# Patient Record
Sex: Female | Born: 1955 | Race: White | Hispanic: No | Marital: Married | Smoking: Never smoker
Health system: Southern US, Community
[De-identification: ages and names within clinical notes are randomized; demographics above are authoritative.]

## PROBLEM LIST (undated history)

## (undated) ENCOUNTER — Ambulatory Visit (HOSPITAL_BASED_OUTPATIENT_CLINIC_OR_DEPARTMENT_OTHER): Admission: EM | Source: Home / Self Care

## (undated) DIAGNOSIS — N183 Chronic kidney disease, stage 3 unspecified: Secondary | ICD-10-CM

## (undated) DIAGNOSIS — C569 Malignant neoplasm of unspecified ovary: Secondary | ICD-10-CM

## (undated) DIAGNOSIS — Z923 Personal history of irradiation: Secondary | ICD-10-CM

## (undated) DIAGNOSIS — D3910 Neoplasm of uncertain behavior of unspecified ovary: Secondary | ICD-10-CM

## (undated) DIAGNOSIS — M1990 Unspecified osteoarthritis, unspecified site: Secondary | ICD-10-CM

## (undated) DIAGNOSIS — K760 Fatty (change of) liver, not elsewhere classified: Secondary | ICD-10-CM

## (undated) DIAGNOSIS — E785 Hyperlipidemia, unspecified: Secondary | ICD-10-CM

## (undated) DIAGNOSIS — K219 Gastro-esophageal reflux disease without esophagitis: Secondary | ICD-10-CM

## (undated) DIAGNOSIS — Z8719 Personal history of other diseases of the digestive system: Secondary | ICD-10-CM

## (undated) DIAGNOSIS — G542 Cervical root disorders, not elsewhere classified: Secondary | ICD-10-CM

## (undated) DIAGNOSIS — M542 Cervicalgia: Secondary | ICD-10-CM

## (undated) DIAGNOSIS — M2550 Pain in unspecified joint: Secondary | ICD-10-CM

## (undated) DIAGNOSIS — G4730 Sleep apnea, unspecified: Secondary | ICD-10-CM

## (undated) DIAGNOSIS — K5900 Constipation, unspecified: Secondary | ICD-10-CM

## (undated) DIAGNOSIS — R112 Nausea with vomiting, unspecified: Secondary | ICD-10-CM

## (undated) DIAGNOSIS — F419 Anxiety disorder, unspecified: Secondary | ICD-10-CM

## (undated) DIAGNOSIS — R251 Tremor, unspecified: Secondary | ICD-10-CM

## (undated) DIAGNOSIS — Z9889 Other specified postprocedural states: Secondary | ICD-10-CM

## (undated) DIAGNOSIS — E669 Obesity, unspecified: Secondary | ICD-10-CM

## (undated) HISTORY — DX: Neoplasm of uncertain behavior of unspecified ovary: D39.10

## (undated) HISTORY — PX: OTHER SURGICAL HISTORY: SHX169

## (undated) HISTORY — DX: Malignant neoplasm of unspecified ovary: C56.9

## (undated) HISTORY — DX: Unspecified osteoarthritis, unspecified site: M19.90

## (undated) HISTORY — PX: APPENDECTOMY: SHX54

## (undated) HISTORY — DX: Constipation, unspecified: K59.00

## (undated) HISTORY — DX: Cervicalgia: M54.2

## (undated) HISTORY — DX: Gastro-esophageal reflux disease without esophagitis: K21.9

## (undated) HISTORY — DX: Fatty (change of) liver, not elsewhere classified: K76.0

## (undated) HISTORY — PX: EXPLORATORY LAPAROTOMY: SUR591

## (undated) HISTORY — DX: Hyperlipidemia, unspecified: E78.5

## (undated) HISTORY — PX: ABDOMINAL HYSTERECTOMY: SHX81

## (undated) HISTORY — PX: SHOULDER SURGERY: SHX246

## (undated) HISTORY — PX: VENTRAL HERNIA REPAIR: SHX424

## (undated) HISTORY — DX: Sleep apnea, unspecified: G47.30

## (undated) HISTORY — DX: Obesity, unspecified: E66.9

## (undated) HISTORY — DX: Tremor, unspecified: R25.1

## (undated) HISTORY — DX: Pain in unspecified joint: M25.50

---

## 1997-09-14 ENCOUNTER — Ambulatory Visit (HOSPITAL_COMMUNITY): Admission: RE | Admit: 1997-09-14 | Discharge: 1997-09-14 | Payer: Self-pay | Admitting: Obstetrics & Gynecology

## 1997-11-08 ENCOUNTER — Ambulatory Visit: Admission: RE | Admit: 1997-11-08 | Discharge: 1997-11-08 | Payer: Self-pay | Admitting: Gynecology

## 1998-04-04 ENCOUNTER — Ambulatory Visit (HOSPITAL_COMMUNITY): Admission: RE | Admit: 1998-04-04 | Discharge: 1998-04-04 | Payer: Self-pay | Admitting: Obstetrics & Gynecology

## 1998-04-04 ENCOUNTER — Encounter: Payer: Self-pay | Admitting: Obstetrics & Gynecology

## 1998-04-26 ENCOUNTER — Inpatient Hospital Stay (HOSPITAL_COMMUNITY): Admission: RE | Admit: 1998-04-26 | Discharge: 1998-05-01 | Payer: Self-pay | Admitting: Obstetrics & Gynecology

## 1998-11-21 ENCOUNTER — Encounter: Payer: Self-pay | Admitting: Obstetrics & Gynecology

## 1998-11-21 ENCOUNTER — Ambulatory Visit (HOSPITAL_COMMUNITY): Admission: RE | Admit: 1998-11-21 | Discharge: 1998-11-21 | Payer: Self-pay | Admitting: Obstetrics & Gynecology

## 1998-11-29 ENCOUNTER — Encounter: Payer: Self-pay | Admitting: Gynecology

## 1998-11-29 ENCOUNTER — Ambulatory Visit (HOSPITAL_COMMUNITY): Admission: RE | Admit: 1998-11-29 | Discharge: 1998-11-29 | Payer: Self-pay | Admitting: Gynecology

## 1998-11-29 ENCOUNTER — Encounter (INDEPENDENT_AMBULATORY_CARE_PROVIDER_SITE_OTHER): Payer: Self-pay | Admitting: Specialist

## 1998-12-01 ENCOUNTER — Encounter: Payer: Self-pay | Admitting: Obstetrics and Gynecology

## 1998-12-02 ENCOUNTER — Inpatient Hospital Stay (HOSPITAL_COMMUNITY): Admission: AD | Admit: 1998-12-02 | Discharge: 1998-12-04 | Payer: Self-pay | Admitting: Obstetrics and Gynecology

## 1999-02-21 ENCOUNTER — Ambulatory Visit (HOSPITAL_COMMUNITY): Admission: RE | Admit: 1999-02-21 | Discharge: 1999-02-21 | Payer: Self-pay | Admitting: Obstetrics & Gynecology

## 1999-02-21 ENCOUNTER — Encounter: Payer: Self-pay | Admitting: Obstetrics & Gynecology

## 1999-03-06 ENCOUNTER — Ambulatory Visit (HOSPITAL_COMMUNITY): Admission: RE | Admit: 1999-03-06 | Discharge: 1999-03-06 | Payer: Self-pay | Admitting: Gastroenterology

## 1999-07-17 ENCOUNTER — Other Ambulatory Visit: Admission: RE | Admit: 1999-07-17 | Discharge: 1999-07-17 | Payer: Self-pay | Admitting: Obstetrics & Gynecology

## 1999-07-18 ENCOUNTER — Ambulatory Visit (HOSPITAL_COMMUNITY): Admission: RE | Admit: 1999-07-18 | Discharge: 1999-07-18 | Payer: Self-pay | Admitting: Obstetrics & Gynecology

## 1999-07-18 ENCOUNTER — Encounter: Payer: Self-pay | Admitting: Obstetrics & Gynecology

## 2000-01-22 ENCOUNTER — Encounter: Payer: Self-pay | Admitting: Obstetrics & Gynecology

## 2000-01-22 ENCOUNTER — Ambulatory Visit (HOSPITAL_COMMUNITY): Admission: RE | Admit: 2000-01-22 | Discharge: 2000-01-22 | Payer: Self-pay | Admitting: Obstetrics & Gynecology

## 2000-03-19 ENCOUNTER — Other Ambulatory Visit: Admission: RE | Admit: 2000-03-19 | Discharge: 2000-03-19 | Payer: Self-pay | Admitting: Obstetrics & Gynecology

## 2000-04-08 ENCOUNTER — Encounter: Payer: Self-pay | Admitting: Obstetrics & Gynecology

## 2000-04-08 ENCOUNTER — Ambulatory Visit (HOSPITAL_COMMUNITY): Admission: RE | Admit: 2000-04-08 | Discharge: 2000-04-08 | Payer: Self-pay | Admitting: Obstetrics & Gynecology

## 2000-04-22 HISTORY — PX: OTHER SURGICAL HISTORY: SHX169

## 2000-04-23 ENCOUNTER — Ambulatory Visit: Admission: RE | Admit: 2000-04-23 | Discharge: 2000-04-23 | Payer: Self-pay | Admitting: Gynecology

## 2000-04-30 ENCOUNTER — Inpatient Hospital Stay (HOSPITAL_COMMUNITY): Admission: RE | Admit: 2000-04-30 | Discharge: 2000-05-03 | Payer: Self-pay | Admitting: Gynecology

## 2000-04-30 ENCOUNTER — Encounter (INDEPENDENT_AMBULATORY_CARE_PROVIDER_SITE_OTHER): Payer: Self-pay

## 2000-05-06 ENCOUNTER — Ambulatory Visit: Admission: RE | Admit: 2000-05-06 | Discharge: 2000-05-06 | Payer: Self-pay | Admitting: Gynecology

## 2000-06-11 ENCOUNTER — Ambulatory Visit: Admission: RE | Admit: 2000-06-11 | Discharge: 2000-06-11 | Payer: Self-pay | Admitting: Gynecology

## 2000-10-06 ENCOUNTER — Other Ambulatory Visit: Admission: RE | Admit: 2000-10-06 | Discharge: 2000-10-06 | Payer: Self-pay | Admitting: Gynecology

## 2000-10-13 ENCOUNTER — Observation Stay (HOSPITAL_COMMUNITY): Admission: EM | Admit: 2000-10-13 | Discharge: 2000-10-14 | Payer: Self-pay | Admitting: Gynecology

## 2000-11-04 ENCOUNTER — Encounter: Admission: RE | Admit: 2000-11-04 | Discharge: 2000-11-04 | Payer: Self-pay | Admitting: Gastroenterology

## 2000-11-04 ENCOUNTER — Encounter: Payer: Self-pay | Admitting: Gastroenterology

## 2000-11-05 ENCOUNTER — Other Ambulatory Visit: Admission: RE | Admit: 2000-11-05 | Discharge: 2000-11-05 | Payer: Self-pay | Admitting: Gynecology

## 2000-11-05 ENCOUNTER — Ambulatory Visit: Admission: RE | Admit: 2000-11-05 | Discharge: 2000-11-05 | Payer: Self-pay | Admitting: Gynecology

## 2000-11-05 ENCOUNTER — Encounter (INDEPENDENT_AMBULATORY_CARE_PROVIDER_SITE_OTHER): Payer: Self-pay | Admitting: *Deleted

## 2000-12-24 ENCOUNTER — Ambulatory Visit: Admission: RE | Admit: 2000-12-24 | Discharge: 2000-12-24 | Payer: Self-pay | Admitting: Gynecology

## 2000-12-26 ENCOUNTER — Ambulatory Visit (HOSPITAL_COMMUNITY): Admission: RE | Admit: 2000-12-26 | Discharge: 2000-12-26 | Payer: Self-pay | Admitting: Gynecology

## 2000-12-26 ENCOUNTER — Encounter: Payer: Self-pay | Admitting: Gynecology

## 2001-01-14 ENCOUNTER — Ambulatory Visit (HOSPITAL_BASED_OUTPATIENT_CLINIC_OR_DEPARTMENT_OTHER): Admission: RE | Admit: 2001-01-14 | Discharge: 2001-01-14 | Payer: Self-pay | Admitting: Gynecology

## 2001-02-25 ENCOUNTER — Ambulatory Visit: Admission: RE | Admit: 2001-02-25 | Discharge: 2001-02-25 | Payer: Self-pay | Admitting: Gynecology

## 2001-04-10 ENCOUNTER — Ambulatory Visit (HOSPITAL_COMMUNITY): Admission: RE | Admit: 2001-04-10 | Discharge: 2001-04-10 | Payer: Self-pay | Admitting: Gynecology

## 2001-04-10 ENCOUNTER — Encounter: Payer: Self-pay | Admitting: Gynecology

## 2001-05-13 ENCOUNTER — Ambulatory Visit: Admission: RE | Admit: 2001-05-13 | Discharge: 2001-05-13 | Payer: Self-pay | Admitting: Gynecology

## 2001-07-13 ENCOUNTER — Other Ambulatory Visit: Admission: RE | Admit: 2001-07-13 | Discharge: 2001-07-13 | Payer: Self-pay | Admitting: Obstetrics & Gynecology

## 2001-08-10 ENCOUNTER — Ambulatory Visit (HOSPITAL_BASED_OUTPATIENT_CLINIC_OR_DEPARTMENT_OTHER): Admission: RE | Admit: 2001-08-10 | Discharge: 2001-08-10 | Payer: Self-pay | Admitting: Urology

## 2001-11-18 ENCOUNTER — Ambulatory Visit: Admission: RE | Admit: 2001-11-18 | Discharge: 2001-11-18 | Payer: Self-pay | Admitting: Gynecology

## 2002-02-24 ENCOUNTER — Encounter: Payer: Self-pay | Admitting: Gynecology

## 2002-02-24 ENCOUNTER — Ambulatory Visit: Admission: RE | Admit: 2002-02-24 | Discharge: 2002-02-24 | Payer: Self-pay | Admitting: Gynecology

## 2002-08-26 ENCOUNTER — Ambulatory Visit (HOSPITAL_COMMUNITY): Admission: RE | Admit: 2002-08-26 | Discharge: 2002-08-26 | Payer: Self-pay | Admitting: Gynecology

## 2002-08-26 ENCOUNTER — Encounter: Payer: Self-pay | Admitting: Gynecology

## 2002-11-15 ENCOUNTER — Ambulatory Visit (HOSPITAL_COMMUNITY): Admission: RE | Admit: 2002-11-15 | Discharge: 2002-11-15 | Payer: Self-pay | Admitting: Internal Medicine

## 2002-11-15 ENCOUNTER — Encounter: Payer: Self-pay | Admitting: Internal Medicine

## 2002-11-23 ENCOUNTER — Other Ambulatory Visit: Admission: RE | Admit: 2002-11-23 | Discharge: 2002-11-23 | Payer: Self-pay | Admitting: Obstetrics & Gynecology

## 2002-11-30 ENCOUNTER — Ambulatory Visit: Admission: RE | Admit: 2002-11-30 | Discharge: 2002-11-30 | Payer: Self-pay | Admitting: Gynecology

## 2003-05-10 ENCOUNTER — Ambulatory Visit (HOSPITAL_COMMUNITY): Admission: RE | Admit: 2003-05-10 | Discharge: 2003-05-10 | Payer: Self-pay | Admitting: Gynecology

## 2003-12-05 ENCOUNTER — Encounter (INDEPENDENT_AMBULATORY_CARE_PROVIDER_SITE_OTHER): Payer: Self-pay | Admitting: Specialist

## 2003-12-05 ENCOUNTER — Observation Stay (HOSPITAL_COMMUNITY): Admission: RE | Admit: 2003-12-05 | Discharge: 2003-12-06 | Payer: Self-pay | Admitting: General Surgery

## 2003-12-26 ENCOUNTER — Inpatient Hospital Stay (HOSPITAL_COMMUNITY): Admission: EM | Admit: 2003-12-26 | Discharge: 2004-01-03 | Payer: Self-pay | Admitting: Emergency Medicine

## 2004-03-08 ENCOUNTER — Other Ambulatory Visit: Admission: RE | Admit: 2004-03-08 | Discharge: 2004-03-08 | Payer: Self-pay | Admitting: Obstetrics & Gynecology

## 2004-04-03 ENCOUNTER — Ambulatory Visit: Admission: RE | Admit: 2004-04-03 | Discharge: 2004-04-03 | Payer: Self-pay | Admitting: Gynecology

## 2005-01-09 ENCOUNTER — Ambulatory Visit: Admission: RE | Admit: 2005-01-09 | Discharge: 2005-01-09 | Payer: Self-pay | Admitting: Gynecology

## 2005-05-02 ENCOUNTER — Other Ambulatory Visit: Admission: RE | Admit: 2005-05-02 | Discharge: 2005-05-02 | Payer: Self-pay | Admitting: Obstetrics & Gynecology

## 2005-12-04 ENCOUNTER — Ambulatory Visit (HOSPITAL_COMMUNITY): Admission: RE | Admit: 2005-12-04 | Discharge: 2005-12-04 | Payer: Self-pay | Admitting: Obstetrics & Gynecology

## 2005-12-10 ENCOUNTER — Ambulatory Visit: Admission: RE | Admit: 2005-12-10 | Discharge: 2005-12-10 | Payer: Self-pay | Admitting: Gynecology

## 2007-01-30 ENCOUNTER — Ambulatory Visit: Admission: RE | Admit: 2007-01-30 | Discharge: 2007-01-30 | Payer: Self-pay | Admitting: Gynecology

## 2008-03-03 ENCOUNTER — Ambulatory Visit (HOSPITAL_COMMUNITY): Admission: RE | Admit: 2008-03-03 | Discharge: 2008-03-03 | Payer: Self-pay | Admitting: Gynecology

## 2008-03-04 ENCOUNTER — Ambulatory Visit: Admission: RE | Admit: 2008-03-04 | Discharge: 2008-03-04 | Payer: Self-pay | Admitting: Gynecology

## 2008-04-22 HISTORY — PX: OTHER SURGICAL HISTORY: SHX169

## 2008-09-16 ENCOUNTER — Ambulatory Visit: Admission: RE | Admit: 2008-09-16 | Discharge: 2008-09-16 | Payer: Self-pay | Admitting: Gynecology

## 2009-03-15 ENCOUNTER — Ambulatory Visit: Admission: RE | Admit: 2009-03-15 | Discharge: 2009-03-15 | Payer: Self-pay | Admitting: Gynecology

## 2009-04-18 ENCOUNTER — Inpatient Hospital Stay (HOSPITAL_COMMUNITY): Admission: RE | Admit: 2009-04-18 | Discharge: 2009-04-23 | Payer: Self-pay | Admitting: Gynecology

## 2009-04-18 ENCOUNTER — Encounter (INDEPENDENT_AMBULATORY_CARE_PROVIDER_SITE_OTHER): Payer: Self-pay | Admitting: General Surgery

## 2009-05-12 ENCOUNTER — Ambulatory Visit: Admission: RE | Admit: 2009-05-12 | Discharge: 2009-05-12 | Payer: Self-pay | Admitting: Gynecology

## 2010-04-30 ENCOUNTER — Ambulatory Visit (HOSPITAL_COMMUNITY)
Admission: RE | Admit: 2010-04-30 | Discharge: 2010-04-30 | Payer: Self-pay | Source: Home / Self Care | Attending: Gynecology | Admitting: Gynecology

## 2010-05-13 ENCOUNTER — Encounter: Payer: Self-pay | Admitting: Gynecology

## 2010-05-23 ENCOUNTER — Ambulatory Visit: Payer: PRIVATE HEALTH INSURANCE | Attending: Gynecology | Admitting: Gynecology

## 2010-05-23 ENCOUNTER — Other Ambulatory Visit: Payer: Self-pay | Admitting: Gynecology

## 2010-05-23 DIAGNOSIS — Z90710 Acquired absence of both cervix and uterus: Secondary | ICD-10-CM | POA: Insufficient documentation

## 2010-05-23 DIAGNOSIS — C569 Malignant neoplasm of unspecified ovary: Secondary | ICD-10-CM

## 2010-05-23 DIAGNOSIS — K7689 Other specified diseases of liver: Secondary | ICD-10-CM | POA: Insufficient documentation

## 2010-05-23 DIAGNOSIS — E669 Obesity, unspecified: Secondary | ICD-10-CM | POA: Insufficient documentation

## 2010-05-23 DIAGNOSIS — R1909 Other intra-abdominal and pelvic swelling, mass and lump: Secondary | ICD-10-CM | POA: Insufficient documentation

## 2010-05-24 NOTE — Consult Note (Signed)
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  The patient returns today for continuing followup having had a CT scan on January 9, which shows probable slight progression of disease around her liver and pelvis.  Specifically, there is now a 1.7 x 1.5 low density lesion on the right lobe of the liver, which previously measured 7 mm.  Adjacent to this area is another 1-cm lesion and a 0.6-cm nodule both of which have increased by 1-2 mm over the past 2 years (comparison with CT scan of March 03, 2008).  In addition, there is a 1.5 x 1.1 cm lesion in the left pelvis.  I have reviewed this with Dr. Fredia Sorrow in Radiology who feels that it is not safe to perform radiofrequency ablation or cryoblation.  He further notes the consistency of the larger lesion near the liver is cystic and not solid.  It is recalled that approximately a year ago, Dr. Johna Sheriff and I resected some pelvic tumor nodules and explored the upper abdomen, did not find any metastatic disease at that time.  The patient herself feels well.  She denies any GI or GU symptoms.  Has no pelvic pain, pressure, vaginal bleeding or discharge.  Functional status is excellent.  HISTORY OF PRESENT ILLNESS:  The patient has a longstanding history of granulosa cell tumor of the ovary.  Her recurrence in 2002 in the upper abdomen was resected, and the patient subsequently received 6 cycles of intraperitoneal cisplatin and etoposide completed in June 2002.  She was followed between 2002 and 2010 with CT scans.  In November 2010, CT scan showed some increasing size of  nodules in the pelvis.  The patient subsequently underwent exploratory laparotomy on April 18, 2009, resecting tumor nodules near the cecum and left pelvic sidewall.  As noted above, the liver was explored and found to be entirely free of any disease at that time.  The tumor was estrogen receptor negative, progesterone receptor positive.  Approximately a year ago, the patient discontinued use of her hormone replacement therapy (Premarin).  PAST MEDICAL HISTORY:  Medical illnesses, obesity.  PAST SURGICAL HISTORY: 1. TAH-BSO. 2. Secondary tumor debulking in 2002. 3. Laparoscopic ventral hernia repair. 4. Re-exploration for bowel obstruction. 5. Tumor debulking in 2010.  DRUG ALLERGIES: 1. CIPRO. 2. SULFA. 3. SODIUM THIOSULFATE.  CURRENT MEDICATIONS:  Premarin, Paxil, Imitrex p.r.n. migraine headaches.  FAMILY HISTORY:  Negative for gynecologic, breast or colon cancer.  SOCIAL HISTORY:  The patient is married.  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  OBSTETRICAL HISTORY:  Gravida 2.  REVIEW OF SYSTEMS:  Ten-point comprehensive review of systems negative except as noted above.  PHYSICAL EXAMINATION:  VITAL SIGNS:  Weight 208 pounds, height 5 feet 5 inches, blood pressure 98/64. GENERAL:  Patient is a healthy white female in no acute distress. HEENT:  Negative. NECK:  Supple without thyromegaly.  There is no supraclavicular or inguinal adenopathy. ABDOMEN:  Soft.  No masses, organomegaly, ascites or hernias are noted. PELVIC:  EGBUS, vagina, bladder and urethra are normal.  Cervix and uterus surgically absent.  Adnexa without masses.  Rectovaginal exam confirms.  IMPRESSION:  Probable slight progression of disease in the upper abdomen and pelvis.  The patient is entirely asymptomatic.  We will schedule the patient have a PET scan for reassessment.  Further, the patient questions whether her prior  use of Premarin may have resulted in control of her tumor.  While I cannot offer a good pathophysiological explanation, I have no evidence on the other hand that Premarin would promote tumor growth and therefore the patient desires to reinstitute the use of Premarin 0.625 mg daily.  We will contact her with the PET scan report and make further plans thereafter.     De Blanch, M.D.     DC/MEDQ  D:  05/23/2010  T:  05/23/2010  Job:  [id number]  cc:   Telford Nab, R.N. [address] [place], [place] [address]  W. Varney Baas, M.D. Fax: [phone]  Electronically Signed by De Blanch M.D. on 05/24/2010 01:24:48 PM

## 2010-06-06 ENCOUNTER — Encounter (HOSPITAL_COMMUNITY)
Admission: RE | Admit: 2010-06-06 | Discharge: 2010-06-06 | Disposition: A | Discharge status: Home / Self Care | Payer: PRIVATE HEALTH INSURANCE | Source: Ambulatory Visit | Attending: Gynecology | Admitting: Gynecology

## 2010-06-06 ENCOUNTER — Encounter (HOSPITAL_COMMUNITY): Payer: Self-pay

## 2010-06-06 ENCOUNTER — Other Ambulatory Visit: Payer: Self-pay | Admitting: Gynecology

## 2010-06-06 DIAGNOSIS — C569 Malignant neoplasm of unspecified ovary: Secondary | ICD-10-CM

## 2010-06-06 DIAGNOSIS — R935 Abnormal findings on diagnostic imaging of other abdominal regions, including retroperitoneum: Secondary | ICD-10-CM | POA: Insufficient documentation

## 2010-06-06 MED ORDER — FLUDEOXYGLUCOSE F - 18 (FDG) INJECTION
15.8000 | Freq: Once | INTRAVENOUS | Status: AC | PRN
  Administered 2010-06-06: 15.8 via INTRAVENOUS

## 2010-07-23 LAB — DIFFERENTIAL
Basophils Absolute: 0 10*3/uL (ref 0.0–0.1)
Eosinophils Absolute: 0.1 10*3/uL (ref 0.0–0.7)
Eosinophils Relative: 2 % (ref 0–5)
Lymphocytes Relative: 33 % (ref 12–46)
Lymphs Abs: 1.8 10*3/uL (ref 0.7–4.0)
Monocytes Absolute: 0.5 10*3/uL (ref 0.1–1.0)
Monocytes Relative: 10 % (ref 3–12)

## 2010-07-23 LAB — COMPREHENSIVE METABOLIC PANEL
Albumin: 3.8 g/dL (ref 3.5–5.2)
BUN: 19 mg/dL (ref 6–23)
CO2: 27 mEq/L (ref 19–32)
Creatinine, Ser: 1.06 mg/dL (ref 0.4–1.2)
GFR calc Af Amer: >60 mL/min (ref >60)
Glucose, Bld: 91 mg/dL (ref 70–99)

## 2010-07-23 LAB — BASIC METABOLIC PANEL
CO2: 27 mEq/L (ref 19–32)
Creatinine, Ser: 1.17 mg/dL (ref 0.4–1.2)
GFR calc Af Amer: 59 mL/min — ABNORMAL LOW (ref >60)
GFR calc non Af Amer: 49 mL/min — ABNORMAL LOW (ref >60)
Potassium: 4.2 mEq/L (ref 3.5–5.1)
Sodium: 134 mEq/L — ABNORMAL LOW (ref 135–145)

## 2010-07-23 LAB — TYPE AND SCREEN: ABO/RH(D): A POS

## 2010-07-23 LAB — CBC
Hemoglobin: 14.1 g/dL (ref 12.0–15.0)
MCHC: 34.2 g/dL (ref 30.0–36.0)
MCHC: 34.6 g/dL (ref 30.0–36.0)
MCV: 91.3 fL (ref 78.0–100.0)
Platelets: 231 10*3/uL (ref 150–400)
Platelets: 283 10*3/uL (ref 150–400)
RBC: 4.08 MIL/uL (ref 3.87–5.11)
WBC: 5.3 10*3/uL (ref 4.0–10.5)

## 2010-07-31 LAB — MISCELLANEOUS TEST

## 2010-07-31 LAB — INHIBIN A: Inhibin-A: <1 pg/mL

## 2010-09-04 NOTE — Consult Note (Signed)
NAMEOSCEOLA, HOLIAN                  ACCOUNT NO.:  [id number]   MEDICAL RECORD NO.:  [id number]          PATIENT TYPE:  OUT   LOCATION:  GYN                          FACILITY:  [facility]   PHYSICIAN:  De Blanch, M.D.DATE OF BIRTH:  1955-07-21   DATE OF CONSULTATION:  03/04/2008  DATE OF DISCHARGE:                                 CONSULTATION   CHIEF COMPLAINT:  Granulosa cell tumor of the ovary, right lower  quadrant mass.   INTERVAL HISTORY:  The patient returns today having recently had a  routine surveillance CT scan which showed a retrocecal mass measuring  2.4 x 1.7 mm (previously it measured 1.1 x 0.9 cm).  A fine needle  aspirate of the mass was performed which showed it was cystic with 5 mL  of fluid.  Cytology was consistent with recurrent granulosa cell tumor.  The patient underwent further evaluation with a PET scan which showed no  other areas of abnormality and in fact once the cystic area was drained  it did not have increased activity either.  The patient does have some  nodularity in the right lobe of the liver and lung which are unchanged  but appears to be an adrenal adenoma.   Clinically the patient feels well.  She denies any GI or GU symptoms,  has no pelvic pain, pressure, vaginal bleed or discharge.  Functional  status is excellent.   HISTORY OF PRESENT ILLNESS:  The patient has a longstanding history of  granulosa cell tumor of the ovary.  In 2002 she had the upper abdominal  recurrence which was resected and subsequently treated with six cycles  of intraperitoneal cisplatin and etoposide completed in June of 2002.  She had been followed since that time with no evidence of recurrent  disease with CT scans and inhibin levels.   PAST MEDICAL HISTORY:  Medical illnesses none.   PAST SURGICAL HISTORY:  TAH-BSO and secondary tumor debulking in 2002,  laparoscopic ventral hernia repair, re-exploration for small bowel  obstruction.   DRUG ALLERGIES:   CIPRO, SULFA, SODIUM THIOSULFATE.   CURRENT MEDICATIONS:  Premarin, Paxil, Imitrex p.r.n.   FAMILY HISTORY:  Negative for gynecologic, breast or colon cancer.   SOCIAL HISTORY:  The patient is married.  She is a Engineer, civil (consulting) at the [employer].  She does not smoke.  She has two teenage children.   REVIEW OF SYSTEMS:  A 10 point comprehensive review of systems is  negative except as noted above.   PHYSICAL EXAMINATION:  VITAL SIGNS:  Weight 212 pounds.  GENERAL:  The patient is a pleasant, moderately obese white female in no  acute distress.  HEENT:  Negative.  NECK:  Supple without thyromegaly.  There is no supraclavicular or  inguinal adenopathy.  ABDOMEN:  The abdomen is obese, soft, nontender.  No mass, organomegaly,  ascites or hernias noted.  PELVIC:  EG/BUS, vagina, urethra are normal.  Cervix and uterus  surgically absent.  Adnexa without masses.  Rectovaginal exam confirms.  LOWER EXTREMITIES:  Lower extremities are without edema or varicosities.   IMPRESSION:  Recurrent granulosa cell tumor with a retrocecal cystic  mass which has now been drained.  Management options were discussed with  the patient and her husband which would include attempts at resecting at  this juncture versus observation.  Given the fact that the mass does not  have increased uptake and it is difficult to visualize now that it has  been drained I would favor observation at this juncture and plan on  repeat CT scan in approximately 3 months.  If the mass becomes larger  and appears resectable I would favor surgical resection at that time.  The patient is in agreement with this plan.      De Blanch, M.D.  Electronically Signed     DC/MEDQ  D:  03/04/2008  T:  03/05/2008  Job:  [id number]   cc:   Telford Nab, R.N.  [address]  [place], [place] [address]   W. Varney Baas, M.D.  Fax: [phone]   Bernette Redbird, M.D.  Fax: [phone]

## 2010-09-04 NOTE — Consult Note (Signed)
[name], [name]                  ACCOUNT NO.:  [id number]   MEDICAL RECORD NO.:  [id number]          PATIENT TYPE:  OUT   LOCATION:  GYN                          FACILITY:  [facility]   PHYSICIAN:  De Blanch, M.D.DATE OF BIRTH:  31-Mar-1956   DATE OF CONSULTATION:  DATE OF DISCHARGE:                                 CONSULTATION   CHIEF COMPLAINT:  Granulosa cell tumor of the ovary, anal incontinence.   INTERVAL HISTORY:  The patient returns today for continuing followup of  her granulosa cell tumor of the ovary.  We have followed her since  completing intraperitoneal chemotherapy using etoposide and cisplatin  which was completed in June 2002.  She had a CT scan of the abdomen  and  pelvis on August 7th, which was normal with no evidence of metastatic  disease.  She does have chronic findings including a ventral hernia, and  diffuse fatty infiltration of the liver, and a left adrenal probable  adenoma.  Overall, the patient has done well.  She denies any abdominal pain or  pressure or any other GI or GU symptoms except for anal incontinence.  With regard to the anal incontinence, she notes that she seems to be  continent of stool but does have some leakage of flatus.  In addition  after having a bowel movement and wiping, she notes that she continues  to soil her underwear.  She denies any rectal bleeding.  She is  scheduled to have a colonoscopy in the near future.   HISTORY OF PRESENT ILLNESS:  The patient has a long-standing history of  granulosa cell tumor of the ovary.  Her most recent recurrence was  completely resected and the patient was subsequently treated with six  cycles of intraperitoneal cisplatin and etoposide completed in June  2002.  She has been followed since then with no evidence of recurrent  disease.  Followup has included annual CT scans and inhibin levels.   PAST MEDICAL HISTORY:   MEDICAL ILLNESSES:  None.   PAST SURGICAL HISTORY:  1. TAH-BSO  secondary to tumor debulking in 2002.  2. Laparoscopic ventral hernia repair.  3. Re-exploration for a small bowel obstruction.   DRUG ALLERGIES:  1. CIPRO.  2. SULFA.  3. SODIUM SULFATE.   CURRENT MEDICATIONS:  Premarin, Paxil, and Imitrex p.r.n.   FAMILY HISTORY:  Negative for gynecologic, breast, or colon cancer.   SOCIAL HISTORY:  The patient is married.  She is a Engineer, civil (consulting) at [employer].  She does not smoke.  She has 2 teenage children.   REVIEW OF SYSTEMS:  A 10-point comprehensive review of systems is  negative except as noted above.   PHYSICAL EXAMINATION:  VITAL SIGNS:  Weight 206 pounds, blood pressure  117/70, pulse 80, respiratory rate 20.  GENERAL:  The patient is a healthy white female in no acute distress.  HEENT:  Negative.  NECK:  Supple without thyromegaly.  LYMPHATIC:  There is no supraclavicular or inguinal adenopathy.  ABDOMEN:  Obese, soft, nontender.  All incision are well healed.  She  does have a  small ventral hernia in the mid portion of the incision  which is easily reducible.  No masses, organomegaly, ascites are noted.  PELVIC:  EG/BUS, vagina, bladder, urethra are normal.  Cervix and uterus  are surgically absent.  Adnexa without masses.  Rectovaginal exam  confirms.  LOWER EXTREMITIES:  Without edema or varicosities.   IMPRESSION:  Granulosa cell tumor of the ovary, recurrent in 2002 with  no subsequent evidence of recurrent disease over the past 6 years.   PLAN:  1. The patient will see Dr. Jennette Kettle in 6 months.  2. She will return to see [name] in 1 year and have a CT scan and inhibin      levels at that time.  3. With regard to her anal incontinence, I would recommend that she be      evaluated further with endoanal ultrasound and other functional      studies.  Given that the patient is scheduled to see Dr. Matthias Hughs in      the near future, I would suggest that he initiate that workup.      De Blanch, M.D.  Electronically  Signed     DC/MEDQ  D:  01/30/2007  T:  01/30/2007  Job:  [id number]

## 2010-09-04 NOTE — Consult Note (Signed)
[name], [name]                  ACCOUNT NO.:  [id number]   MEDICAL RECORD NO.:  [id number]          PATIENT TYPE:  OUT   LOCATION:  GYN                          [facility]AC[facility]  [facility]   PHYSICIAN:  De Blanch, M.D.DATE OF BIRTH:  [birth year]   DATE OF CONSULTATION:  09/16/2008  DATE OF DISCHARGE:                                 CONSULTATION   CHIEF COMPLAINT:  Granulosa cell tumor of the ovary.   INTERVAL HISTORY:  The patient returns today for continuing follow-up.  She has had some areas on scanning that are somewhat suspicious, and  therefore we repeated his CT scan on May 18.  Comparison with the CT  scan of October 30 and October 13 of 2009 shows essentially stable  changes including a 6-mm right upper lobe pulmonary nodule, nodularity  along the posterior medial margin of the right hepatic lobe, and a 2 x  2.5-cm fluid collection posterior to the cecum.  There were no new  lesions.  The patient herself feels well.  She denies any GI or GU  symptoms.  Has no pelvic pain or pressure or vaginal bleeding or  discharge.  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   HISTORY OF PRESENT ILLNESS:  The patient has a long-standing history of  granulosa cell tumor of the ovary.  In 2002, she had a recurrence in the  upper abdomen which was resected and subsequently treated with 6 cycles  of intraperitoneal cisplatin and etoposide, completed in June of 2002.  She has been followed since that time with no evidence of recurrent  disease until recent CT scans showed some slight changes.  Inhibin  levels have always been normal.   PAST MEDICAL HISTORY:  Medical illnesses:  None.   PAST SURGICAL HISTORY:  1. TAH/BSO and secondary tumor debulking in 2002.  2. Laparoscopic ventral hernia repair.  3. Reexploration for a small-bowel obstruction.   DRUG ALLERGIES:  CIPRO, SULFA, SODIUM THIOSULFATE.   CURRENT MEDICATIONS:  1. Premarin.  2. Paxil.  3. Imitrex  p.r.n.   FAMILY HISTORY:  Negative for gynecologic, breast or colon cancer.   SOCIAL HISTORY:  The patient is married.  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  She does not smoke.  She has 2 teenage  children.  The oldest daughter has graduated from high school this year  and plans to enter [employer] next year.   REVIEW OF SYSTEMS:  Ten-point comprehensive review of systems negative  except as noted.   PHYSICAL EXAMINATION:  Weight 222 pounds, blood pressure 118/80.  GENERAL:  The patient is a healthy, moderately obese, white female in no  acute distress.  HEENT:  Is negative.  NECK:  Supple without thyromegaly.  There is no supraclavicular or  inguinal adenopathy.  ABDOMEN:  Is soft, nontender.  No masses, organomegaly, ascites or  hernias are noted.  PELVIC EXAM:  EGBUS, vagina, bladder, urethra are normal.  Cervix and  uterus are surgically absent.  Adnexa without masses.  Rectovaginal exam  confirms.  LOWER EXTREMITIES:  Without edema or varicosities.   IMPRESSION:  Granulosa cell tumor of the ovary.   I have discussed with the patient the CT scan findings and compared them  with prior CT scan essentially showing stable changes.  We have  discussed the pros and cons of surgical exploration versus continued  observation.  The patient is comfortable with the plan of continued  observation with a CT scan in approximately 6 months.  We will obtain an  inhibin (total and B) as well as an anti-mullerian hormone assay today.      De Blanch, M.D.  Electronically Signed     DC/MEDQ  D:  09/16/2008  T:  09/16/2008  Job:  [id number]   cc:   Telford Nab, R.N.  [address]  [place], [place] [address]   W. Varney Baas, M.D.  Fax: [phone]   Bernette Redbird, M.D.  Fax: [phone]

## 2010-09-07 NOTE — Consult Note (Signed)
[facility]  Patient:    [name], [name]                         MRN: [id number] Proc. Date: 11/05/00 Adm. Date:  [id number] Attending:  Jeannette Corpus CC:         Dellia Beckwith, M.D., c/o [facility], [facility],             [address] [place], [place] [address]  W. Varney Baas, M.D.  Telford Nab, R.N.   Consultation Report  HISTORY OF PRESENT ILLNESS:  A 55 year old who returns in continued follow-up of recurrent granulosa cell tumor of the ovary.  She has now completed six cycles of intraperitoneal cisplatin and etoposide administered at [facility].  She had significant difficulty with GI symptoms throughout the chemotherapy program but had no significant neutropenia or thrombocytopenia. Recently, her Port-A-Cath has been accessed and peritoneal fluid obtained for cytology, which showed no evidence of malignancy.  The patient is very happy about this.  Over the past couple of weeks, she has had increasing epigastric pain.  At a prior CAT scan, she was found to have thickening of the gastric wall thought to be consistent with gastritis.  She is undergoing a workup by Dr. Matthias Hughs. Apparently, an ultrasound of the gallbladder shows no evidence of gallstones or gallbladder disease.  An upper GI series and small bowel follow-through have been performed yesterday and those results are not available.  The patient is scheduled to follow up with Dr. Matthias Hughs later this week.  Otherwise, her appetite is reasonably good.  She has no other significant constitutional symptoms.  REVIEW OF SYSTEMS:  Negative for any cardiovascular, pulmonary, GU, or neurologic symptoms.  FAMILY HISTORY/SOCIAL HISTORY:  Reviewed and unchanged.  PHYSICAL EXAMINATION:  VITAL SIGNS:  Weight 174 pounds, blood pressure 98/64.  GENERAL:  The patient is a healthy white female in no acute distress.  HEENT:  Mild alopecia.  LYMPH  NODES:  There is no supraclavicular or inguinal adenopathy.  ABDOMEN:  Soft and nontender.  No masses, organomegaly, ascites, or hernias are noted.  All incisions are well healed.  Her intraperitoneal Port-A-Cath is in a good location in the left upper quadrant.  PELVIC:  EGBUS normal.  The vagina is clean and well supported.  Bimanual and rectovaginal exam reveal no masses, induration, or nodularity.  IMPRESSION:  Recurrent granulosa cell tumor, status post complete resection and six cycles of intraperitoneal cisplatin and etoposide.  The patient is clinically free of disease.  Her CAT scan in June was essentially negative except for thickening of the stomach wall.  Finally, her exam is normal and peritoneal washings are negative on July 15.  The patient will continue her workup with Dr. Matthias Hughs.  She will return to see me in three months.  Once her GI workup is completed, we will consider removing her intraperitoneal Port-A-Cath in the outpatient center.  We will schedule a CT scan for approximately three months for part of her surveillance program.  A Pap smear is obtained today. DD:  11/05/00 TD:  11/05/00 Job: [id number]

## 2010-09-07 NOTE — Discharge Summary (Signed)
[facility]  Patient:    [name], [name]                         MRN: [id number] Adm. Date:  [id number] Disc. Date: [id number] Attending:  Minette Headland CC:         Rande Brunt. Clarke-Pearson, M.D.   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  OPERATION PERFORMED:  Exploratory laparotomy, resection of omental metastases of granulosa cell tumor of the ovary, resection of right diaphragmatic metastases, lysis of adhesions, peritoneal washings, placement of intraperitoneal Port-A-Cath.  INTRAOPERATIVE AND POSTOPERATIVE COMPLICATIONS:  None.  DISPOSITION:  The patient is in satisfactory improved condition at the time of discharge. She is to have progressively increasing physical activity. She is to avoid heavy physical activity. She is to take a regular diet. She is to resume all of her preoperative medication including Premarin 0.625 mg a day. She is given Tylox to be taken as needed for pain which she can use concurrently with ibuprofen. She is to see Dr. De Blanch on January 15 for staple removal and consultation regarding chemotherapy. She is to see me in approximately two weeks for incision check. She is to call for fevers for severe pain for any bleeding range of motion the incision.  Details of the present illness, past history, family history, review of systems and physical exam recorded in the admission note and are in a consultation note per Dr. Serita Kyle. Briefly, the patients findings remarkable for CT findings of a lesion in the left upper quadrant with known previous metastatic granulosa cell tumor of the ovary. She was admitted at this time for surgery.  LABORATORY DATA:  During this admission includes admission CBC with hemoglobin of 13.2, hematocrit 36.4, platelet count of 326, white count of 5.7. Postoperative hemoglobin was 11.1, hematocrit 30.5, white  count 7.6. Admission prothrombin time, PTT and INR were all normal. Admission chemistry profile, metabolic profile was normal. Admission urinalysis was normal.  The patient was admitted on the morning of surgery after an extensive bowel prep. She was given preoperative antibiotics. She was taken to the operating room where an exploratory laparotomy was performed by subcostal incision in the left upper quadrant. The above described operative procedure was accomplished without difficulty. Findings are noted in the postoperative diagnosis.  HOSPITAL COURSE:  Uneventful. She remained afebrile throughout the hospital stay. She had regular flushing of the Port-A-Cath and the needle for this flushing procedure was removed immediately prior to discharge. She remained afebrile throughout the hospital stay. She was taking a regular diet, ambulating without difficulty and having normal bowel and bladder function at the time of her discharge. She was discharged with disposition as noted above. D:  05/03/00 TD:  05/03/00 Job: [id number]

## 2010-09-07 NOTE — H&P (Signed)
[name], [name]                            ACCOUNT NO.:  [id number]   MEDICAL RECORD NO.:  [id number]                   PATIENT TYPE:  INP   LOCATION:  0451                                 FACILITY:  [facility]   PHYSICIAN:  Sandria Bales. Ezzard Standing, M.D.               DATE OF BIRTH:  [birth year]   DATE OF ADMISSION:  12/26/2003  DATE OF DISCHARGE:                                HISTORY & PHYSICAL   HISTORY OF PRESENT ILLNESS:  This is a 55 year old white female who is a  patient of Dr. Kirby Funk for primary care, has seen Dr. Marcene Corning-  Sharol Given, Dr. Richardean Chimera, Dr. Konrad Dolores for gynecologic repair, and had a  recent laparoscopic ventral hernia by Dr. Avel Peace.  She has a very  complicated GI/abdominal history that she apparently has had recurrent, a  granulosa cell tumor.  Has undergone multiple abdominal operations, which  have included an appendectomy, an oophorectomy, hysterectomy, exploratory  laparoscopy x3, intra-peritoneal chemotherapy, excision of a left upper  quadrant mass.  She underwent, on December 05, 2003, a laparoscopic ventral  hernia repair by Dr. Avel Peace.  At the same time, they obtained  washings of the peritoneal cavity, which were reported as negative.  I do  not have the chart in front of me at the time of this dictation.  As best I  can tell from records, Dr. Abbey Chatters placed a Parietex mesh in her  abdominal cavity.  This was a piece of polypropylene covered with a non-  adherent barrier.  The patient had an uneventful postoperative course, and  again did very well until around noon to 1 p.m. today.  She called me on the  phone.  She was having increasingly colicky abdominal pain which sort of  came in waves every 10-15 minutes, which was accompanied with nausea.  She  met me in the emergency room where her pain had increased.  I obtained a CT  scan of her abdomen with oral contrast; however, she vomited most of the  oral contrast and could not keep it  down.  The CT scan initially read by Dr.  Abelino Derrick suggested a __________ over the recent laparoscopic mesh, but  actually I think in review this was probably a seroma over this.  I could  not find a discrete point of obstruction, but the patient, I thought, did  have abnormally thickened bowel with dilated loops consistent with a partial  bowel obstruction.  It was hard to tell whether loops were thickened because  of possible ischemia/obstruction versus her preoperative prior intra-  peritoneal chemotherapy.   ALLERGIES:  1.  SULFA.  2.  CODEINE.  3.  STADOL.  4.  CIPRO.  5.  VICODIN.   MEDICATIONS:  1.  She has been on Premarin 0.625 mg daily.  2.  Prevacid p.r.n.  3.  Lexapro.   PAST MEDICAL HISTORY:  Besides her prior operations I have already outlined,  she has had -  1.  Gastroesophageal reflux disease.  2.  Migraine headaches.  3.  Ovarian cancer, which is __________ recurrent.  4.  Granulosa cell tumor of the ovary.  5.  She has had a cervical neck syndrome.  6.  A herniated disk in her lumbar spine.   She is accompanied by her husband, who was at her side when I examined her  and had discussions.   PHYSICAL EXAMINATION:  VITAL SIGNS:  Temperature is 97.1, blood pressure  125/75, pulse 79, respirations 18.  GENERAL:  She is a well-nourished, pleasant white female, who is alert,  cooperative, understands what is going on well.  HEENT:  Unremarkable.  NECK:  Supple without mass, without thyromegaly.  She has no supraclavicular  or axillary adenopathy.  LUNGS:  Symmetric to auscultation.  HEART:  Regular rate and rhythm without murmur or rub.  ABDOMEN:  She has multiple scars from her prior incisions in her history of  present illness, but still having steri-strips from her laparoscopic ventral  hernia repair.  She points to her sort of epigastrium midway between her  umbilicus and her xiphoid as the source of her biggest pain when these waves  come.  She is  clearly uncomfortable, grimaces during these waves or pain.  She, however, does not, I think, have any peritoneal signs such as guarding  or rebound, and I cannot feel any obvious hernia, though she does have a  firmness in the lower part of the incision which I think would be consistent  with her seroma or prior hernia.  She said she had a normal bowel movement  this morning; however, I did not do a rectal exam on her.  EXTREMITIES:  She has good strength in the upper and lower extremities.  NEUROLOGIC:  Grossly intact.   LABORATORY DATA:  Her white blood count is 9300, hemoglobin 14.2, hematocrit  41, platelet count 461,000.  Sodium of 137, potassium 3.4, chloride 99, CO2  of 28, glucose 123, BUN 27, creatinine 1.3.   Again, her CT scan showed what I think is a seroma of her mesh.  I do not  think it is a hernia.  I do think, though, she has evidence of a bowel  obstruction with some dilated loops of bowel, some thickened bowel wall  which is hard to interpret with her multiple prior abdominal operations.   DIAGNOSIS:  1.  Partial bowel obstruction, whose clinical significance is a little hard      to discern because of her recent surgery and her multiple prior      abdominal operations.  I am concerned, and expressed to her, that it      seems over the last 6-8 hours since she has been first talking to me,      she has had progressive and increasing abdominal pain, which seems      unrelieved with any posture, vomiting, or pain medicine, and that she      could have either a loop of bowel that is not seen well on CT scan, or      an internal hernia which could be leading to ischemic closed loop.  I      talked to her about observing her with an NG tube in the hospital and IV      fluids; however, I think probably the wisest decision would be to take      her  to the operating room, attempt a laparoscopic exploration since Dr.     Abbey Chatters was just there.  However, with her prior  operation, I am not      sure how easily this could be done, and she certainly has the      possibility of needing open abdominal surgery.  I have discussed with      her the potential risk of needing a bowel resection, infection,      bleeding.  2.  History of granulosa cell tumor of the ovary without evidence of      recurrence.  3.  Migraine headache.  4.  History of lumbar disk disease.  5.  Recent abdominal surgery for a ventral hernia.                                               Sandria Bales. Ezzard Standing, M.D.    DHN/MEDQ  D:  12/26/2003  T:  12/27/2003  Job:  [id number]   cc:   Thora Lance, M.D.  [address]  [place]  [place] [address]  Fax: [phone]   Juluis Mire, M.D.  [address]  [place]  [place] [address]  Fax: [phone]   De Blanch, M.D.   Adolph Pollack, M.D.  [address]  [place]  [place] [address]  Fax: [phone]   Telford Nab, R.N.  [phone] [address]  [place], [place] [address]

## 2010-09-07 NOTE — Consult Note (Signed)
[name], [name]                  ACCOUNT NO.:  [id number]   MEDICAL RECORD NO.:  [id number]          PATIENT TYPE:  OUT   LOCATION:  GYN                          FACILITY:  [facility]   PHYSICIAN:  De Blanch, M.D.DATE OF BIRTH:  [birth year]   DATE OF CONSULTATION:  12/10/2005  DATE OF DISCHARGE:  12/10/2005                                   CONSULTATION   CHIEF COMPLAINT:  Granulosis cell tumor of the ovary.   INTERVAL HISTORY:  Since her last visit the patient has done well.  Her only  complaint is that of some upper abdominal pain and discomfort.  Evaluating  her further, she had a CT scan of the abdomen and pelvis on December 04, 2005,  which showed no evidence of metastatic disease, although she has two ventral  herniae, similar to that seen on prior exam dated September 2006.  There is  no evidence of metastatic disease in the abdomen or pelvis and she has no  evidence of ascites.   HISTORY OF PRESENT ILLNESS:  The patient has a longstanding history of  granulosis cell tumor of the ovary.  Her most recent recurrence was  completely resected and then she received six cycles of intra-peritoneal  cisplatin and Etoposide completed in June 2002.  Subsequently she has been  followed, with no evidence of recurrent disease.  She has been followed with  serial CT scans and inhibin-A levels.  Subsequent to her last surgery she  underwent a laparoscopic repair of a ventral hernia.   PAST MEDICAL HISTORY:  No medical illnesses.   PAST SURGICAL HISTORY:  1. TAH/BSO.  2. Secondary tumor debulking in 2002.  3. Laparoscopic ventral hernia repair.  4. Re-exploration for a small bowel obstruction.   ALLERGIES:  CIPRO, SULFA AND SODIUM SULFATE.   CURRENT MEDICATIONS:  1. Premarin.  2. Paxil.  3. Imitrex.   FAMILY HISTORY:  Negative for gynecological, breast or colon cancer.   SOCIAL HISTORY:  The patient is married.  She is a Engineer, civil (consulting) at [employer].  She does not  smoke.   REVIEW OF SYSTEMS:  A 10-point comprehensive review of systems is negative  except as noted above.   PHYSICAL EXAMINATION:  VITAL SIGNS:  Weight is 216 pounds (up 8 pounds from  one year ago).  GENERAL:  The patient is a moderately-obese white female, in no acute  distress.  HEENT:  Negative.  NECK:  Supple without thyromegaly.  NODES:  There is no supraclavicular or inguinal adenopathy.  ABDOMEN:  Soft, nontender except for an area just to the right of her upper  portion of incision.  I cannot specifically feel a hernia, but suspect that  is the source of her discomfort.  No masses or organomegaly are noted.  PELVIC:  EG, BUS, vaginal, bladder and urethra are normal.  The cervix and  uterus are surgically absent.  The adnexa without masses.  RECTOVAGINAL:  Examination confirms.  EXTREMITIES:  Lower extremities without edema or varicosities.   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  No evidence of  recurrent disease.   PLAN:  We will obtain an inhibin-A level today.  She will see Dr. Lacretia Nicks. Varney Baas for her annual exam in December and return to see [name] in one year.  She  will have a CT scan obtained prior to that visit.      De Blanch, M.D.  Electronically Signed     DC/MEDQ  D:  12/13/2005  T:  12/13/2005  Job:  [id number]   cc:   Freddy Finner, M.D.  Fax: [phone]   Telford Nab, P.A.   Adolph Pollack, M.D.  [address]  [place]  [place] [address]

## 2010-09-07 NOTE — Op Note (Signed)
[facility]  Patient:    [name], [name] Visit Number: [id number] MRN: [id number]          Service Type: NES Location: NESC Attending Physician:  Jeannette Corpus Proc. Date: 01/14/01 Admit Date:  01/14/2001   CC:         Telford Nab, R.N.   Operative Report  PREOPERATIVE DIAGNOSIS:  Intraperitoneal Port-A-Cath.  POSTOPERATIVE DIAGNOSIS:  Intraperitoneal Port-A-Cath.  PROCEDURE:  Removal of Intraperitoneal Port-A-Cath.  SURGEON:  Daniel L. Clarke-Pearson, M.D.  ASSISTANT:  Telford Nab, R.N.  ANESTHESIA:  MAC with local (0.5% Marcaine with epinephrine).  SURGICAL FINDINGS:  The Port-A-Cath was located in the chest wall in the left chest.  It came out without difficulty as did the intraperitoneal catheter.  DESCRIPTION OF PROCEDURE:  The patient was taken to the operating room and after satisfactory attainment of MAC anesthesia, the anterior abdominal wall was prepped with Betadine and draped.  An incision was made along the prior left subcostal incision.  This was carried through the subcutaneous tissue until the catheter was identified.  This was pulled into the incision and then using blunt and sharp dissection, we dissected up to the port.  The sutures holding the port to the chest wall were incised and the port removed. Hemostasis was achieved with cautery.  The wound was irrigated and reapproximated with interrupted 2-0 Vicryl sutures.  The skin was closed with a subcuticular closure of 3-0 Vicryl.  Steri-Strips were applied.  A dressing was applied.  The patient was awakened from anesthesia and taken to the recovery room in satisfactory condition.  Sponge, needle, and instrument counts correct x 2. Attending Physician:  Jeannette Corpus DD:  01/14/01 TD:  01/14/01 Job: [id number]

## 2010-09-07 NOTE — Consult Note (Signed)
NAMETANGELA, Mosley                  ACCOUNT NO.:  [id number]   MEDICAL RECORD NO.:  [id number]          PATIENT TYPE:  OUT   LOCATION:  GYN                          [facility]ACILITY[facility]  [facility]   PHYSICIAN:  De Blanch, M.D.DATE OF BIRTH:  05-Apr-1956   DATE OF CONSULTATION:  04/03/2004  DATE OF DISCHARGE:                                   CONSULTATION   A 55 year old white female returns for continuing followup of granulosis  cell tumor of the ovary. Since her last visit, the patient had a ventral  hernia repaired laparoscopically. Unfortunately, she developed a  postoperative small bowel obstruction and required exploratory laparotomy.  During neither of these surgical procedures was any evidence of disease  found although peritoneal cytology showed some atypical cells but not  conclusive for any evidence of recurrent malignancy.   HISTORY OF PRESENT ILLNESS:  The patient has a long standing history of  granulosis cell tumor.  Most recently she was treated with intraperitoneal  cisplatin and etoposide after complete surgical resection.  This therapy was  completed in June of 2002 and she has been followed since that time with  serial CAT scans and serum inhibin levels.  She is showing no evidence of  recurrent disease.   PAST MEDICAL HISTORY:  Medical illnesses none.   PAST SURGICAL HISTORY:  Total abdominal hysterectomy, bilateral salpingo-  oophorectomy, secondary tumor debulking, laparoscopic surgery,  intraperitoneal chemotherapy, repair of ventral hernia.   ALLERGIES:  __________, CIPRO, SULFA and SODIUM SULFATE.   CURRENT MEDICATIONS:  Premarin, Paxil, Imitrex.   REVIEW OF SYMPTOMS:  Negative except as noted above.   SOCIAL HISTORY:  The patient is married, she is a Engineer, civil (consulting) in the [employer].   PHYSICAL EXAMINATION:  VITAL SIGNS:  Weight 186 pounds, blood pressure  120/70.  GENERAL:  The patient is a healthy white female in no acute distress.  HEENT:   Negative.  NECK:  Supple without thyromegaly. There was no supraclavicular or inguinal  adenopathy.  ABDOMEN:  Slightly obese, soft, nontender, no mass, organomegaly, ascites or  hernias are noted.  PELVIC:  EGBUS, vagina, bladder, urethra are normal. Vaginal cuff is well  healed, well supported and no lesions noted.  Bimanual and rectovaginal exam  reveal no masses, induration or nodularity.   IMPRESSION:  Recurrent granulosis cell tumor clinically free of disease  recently undergoing exploratory laparotomy and laparoscopy for repair of a  ventral hernia and a postoperative small bowel obstruction.   PLAN:  Serum inhibin levels obtained today. The patient will return to see  [name] in six months. We will plan a repeat CT scan in August of 2006.     Dani   DC/MEDQ  D:  04/03/2004  T:  04/03/2004  Job:  [id number]   cc:   Abigail Miyamoto, M.D.  [address]  [place]  [place] [address]  Fax: [phone]   W. Varney Baas, M.D.  [address] [place]  [place] [address]  Fax: [phone]   Telford Nab, R.N.  [phone] [address]  [place], [place] [address]

## 2010-09-07 NOTE — Discharge Summary (Signed)
[facility]  Patient:    [name], [name]                         MRN: [id number] Adm. Date:  [id number] Disc. Date: [id number] Attending:  Jeannette Corpus CC:         Telford Nab, R.N.   Discharge Summary  HOSPITAL COURSE:  The patient following admission was placed on intravenous fluids and allowed to drink ad lib.  She was given Kytril for nausea, as well as Ativan. Overnight she began to clear her nausea and was able to eat and drink reasonably well by morning.  Repeat electrolytes in the morning following admission were improved, and were n the normal range.  Her creatinine had fallen down to 2.0, and all of her other electrolytes were in the normal range except for a slightly low calcium.  Urine  output had been good overnight as well.  FINAL DIAGNOSIS:  Dehydration, following cytotoxic chemotherapy, now resolved.  CONDITION ON DISCHARGE:  Improved.  DISCHARGE INSTRUCTIONS:  The patient is encouraged to continue to drink, use Ativan and Kytril for nausea, and to contact Dr. Reuel Boom L. Clarke-Pearson if she is having any recurrent episodes of nausea or vomiting.  She is scheduled to have her Port-A-Cath accessed and aspirated for cytology in  early July, and will return to see Dr. Stanford Breed in [place] on November 01, 2000. DD:  10/14/00 TD:  10/14/00 Job: [id number]

## 2010-09-07 NOTE — Op Note (Signed)
[facility]  Patient:    [name], [name]                         MRN: [id number] Adm. Date:  [id number] Attending:  Minette Headland CC:         Freddy Finner, M.D.  Telford Nab, R.N.  Dellia Beckwith, M.D.   Operative Report  PREOPERATIVE DIAGNOSIS:  Granulosa cell tumor of the ovary with left upper quadrant mass on CT scan, rule out recurrence.  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  PROCEDURES: 1. Exploratory laparotomy. 2. Resection of omental metastases. 3. Resection of right diaphragm metastases. 4. Lysis of adhesions. 5. Peritoneal washings. 6. Placement of intraperitoneal Port-A-Cath.  SURGEON:  Daniel L. Stanford Breed, M.D.  ASSISTANTSFreddy Finner, M.D., and Telford Nab, R.N.  ANESTHESIA:  General with orotracheal tube.  ESTIMATED BLOOD LOSS:  100 cc.  SURGICAL FINDINGS:  The patient was explored through a subcostal incision on the left.  There was a 2 cm metastasis in the omentum near the spleen tip, which is easily excised.  Adhesions from the prior omentectomy and transverse colon to the anterior abdominal wall were lysed and the whole abdomen explored, including running of the small bowel and colon.  There was no evidence of other peritoneal implants in the pelvis, on the small bowel mesentery, or serosa.  On the right diaphragm was a pedunculated 3 x 1 cm plaque of tumor which was completely excised.  At the completion of the surgical procedure, there was no gross residual disease.  DESCRIPTION OF PROCEDURE:  The patient was brought to the operating room and after satisfactory attainment of general anesthesia, he was placed in the modified lithotomy position in [place] stirrups.  The anterior abdominal wall was prepped from the nipple line to the pubis with Betadine.  The perineum and vagina were also prepped and a  Foley catheter was placed.  The patient was draped.  The abdomen was entered through a left subcostal incision placed approximately 3 cm below the costal margin.  Peritoneal washings were obtained.  The upper abdomen was explored with the above-noted findings. Attention was first turned to the palpable nodule in the omentum.  The omentum was clamped beyond the site of the nodule, incised, and the omental nodule excised completely.  A suture was used to control bleeding.  The nodule was submitted for frozen section, which returned confirming that this was a metastatic site of granulosa cell tumor.  The diaphragms were then explored. It was found that the right diaphragm had a 3 x 1 cm tumor nodule attached to it.  Using Bovie cautery, the peritoneal attachment of the tumor nodule was incised and the entire tumor nodule excised and submitted for frozen section, which again confirmed recurrent disease.  Adhesions of the omentum to the anterior abdominal wall prior incisions were lysed using sharp and blunt dissection.  The transverse colon, ascending colon, and descending colon were inspected and no lesions were noted.  The residual omentum between the stomach and transverse colon was also inspected and found to be free of any gross disease.  There were no adhesions to the small bowel, allowing [name] to fully explore the small bowel, serosa, and mesentery from the cecum to the ligament of Treitz.  There was no palpable adenopathy.  The pelvic peritoneum appeared normal.  Washings were obtained from the  pelvis as well and submitted as a separate specimen.  Given that there was no gross residual disease and the patient seems to be recurring with an intraperitoneal distribution of metastases, it was felt that intraperitoneal chemotherapy at this juncture might be beneficial.  Therefore, it was elected to place an intraperitoneal Port-A-Cath.  A subcutaneous tunnel was made from the left subcostal  incision up onto the left chest wall.  A Port-A-Cath was positioned over the rib cage and sutured to the fascia of the chest wall with 2-0 Prolene sutures.  A Port-A-Cath catheter was attached to the port after it was trimmed to the appropriate length.  One Dacron pledget on the catheter was allowed to remain and is in the subcutaneous layer.  The remainder of the catheter was then passed into the peritoneal cavity through the left subcostal incision.  The retractor and patch were removed and the abdomen and pelvis were irrigated.  The peritoneum was then closed with a running suture of 2-0 Vicryl with care to avoid pinching or kinking the Port-A-Cath catheter.  The fascia was then closed with a running suture of 1-0 PDS, again with care taken to avoid kinking the catheter.  The Port-A-Cath was then flushed with heparinized saline and the flush was easily introduced and aspirated.  The subcutaneous tissues were irrigated.  Hemostasis was achieved with cautery.  Skin staples were applied to close the wound.  The Port-A-Cath was then accessed with a Huber needle and flushed once again.  The Port-A-Cath was then covered with an Op-Site and a dressing was placed on the subcostal incision.  The patient was awaken from anesthesia and taken to the recovery room in satisfactory condition.  The sponge, needle, and instrument counts were correct x 2. DD:  04/30/00 TD:  04/30/00 Job: [id number]

## 2010-09-07 NOTE — Op Note (Signed)
[name], [name]                            ACCOUNT NO.:  [id number]   MEDICAL RECORD NO.:  [id number]                   PATIENT TYPE:  AMB   LOCATION:  DAY                                  FACILITY:  [facility]   PHYSICIAN:  Adolph Pollack, M.D.            DATE OF BIRTH:  27-Sep-1955   DATE OF PROCEDURE:  12/05/2003  DATE OF DISCHARGE:                                 OPERATIVE REPORT   PREOPERATIVE DIAGNOSIS:  Ventral incisional hernia with a history of ovarian  cancer.   POSTOPERATIVE DIAGNOSIS:  Ventral incisional hernia with a history of  ovarian cancer.   PROCEDURES:  1. Laparoscopic ventral incisional hernia repair with mesh.  2. Biopsy of peritoneal body.  3. Intraperitoneal washings.   SURGEON:  Adolph Pollack, M.D.   ASSISTANT:  Anselm Pancoast. Zachery Dakins, M.D.   ANESTHESIA:  General.   INDICATIONS:  This is a 55 year old female with ovarian cancer with some  recurrences, status post multiple exploratory laparotomies.  [name] has been  having pain in the lower abdominal incision and has a palpable ventral  hernia in that aspect.  [name] now presents for repair as well as some  intraperitoneal washings as requested by De Blanch, M.D.  The  procedure and the risks were discussed with her preoperatively.   TECHNIQUE:  [name] was seen in the holding area, then brought to the operating  room, placed supine on the operating table, and a general anesthetic was  administered.  A Foley catheter was placed in the bladder.  The abdominal  wall was sterilely prepped and draped.  In the left lateral midabdomen, an  incision was made through the skin, subcutaneous tissue, and fascial layers,  and the peritoneal cavity was entered.  A Hasson trocar was introduced into  the peritoneal cavity and pneumoperitoneum created by instillation of CO2  gas.  The laparoscope was then introduced.  There were minimal adhesions of  the lower abdominal wall, and the hernia defect in the  hypogastric region  was readily identified.  A 10 mm trocar was then placed in the right  midlateral abdomen and then a 5 mm trocar placed in the left lower quadrant  and the right lower quadrant.  We began taking down some omental adhesions  superiorly around the periumbilical area and noticed another small defect  there.  Superiorly there was a little bit of attenuated fascia but appeared  to be intact without evidence of hernia.  I then measured the periphery of  the hernia using the spinal needle in the four quadrants and then measured 4  cm away from these areas for adequate overlap.  A piece of polypropylene  mesh with a nonadherent barrier was brought into the field and cut to size.  Eight anchoring sutures of 0 Novofil were placed around the edges of the  mesh.  The mesh was then hydrated, rolled, and placed into the peritoneal  cavity.   Once in the peritoneal cavity, the mesh was unfurled and positioned  appropriately.  Eight stab incisions were then made around the lower  abdominal wall and periumbilical area to correspond to the eight tacking  sutures.  The anchoring/tacking sutures were then brought up across the  fascial bridge in a sequential fashion and then the mesh was anchored to the  anterior abdominal wall by tightening up and tying down these anchoring  sutures.  I further anchored the mesh to the abdominal wall using the spiral  tacking device in an outer rim of tacks and an inner rim of tacks.  This  provided for more than adequate coverage of the defect with adequate  overlap.   At the beginning of the case I went ahead and injected some saline to the  pelvic area and right upper quadrant region and aspirated some of this fluid  for peritoneal washings.  A small peritoneal body which was white was noted,  and this was removed and sent to pathology.  There was no obvious evidence  of metastatic disease by laparoscopic viewing.   The Hasson trocar was removed and  that fascial defect in the left midlateral  abdomen was closed with a 0 Novofil suture.  The remaining trocars were  removed and the pneumoperitoneum was released.  All skin incisions were  closed with 4-0 Monocryl subcuticular stitches followed by Steri-Strips and  sterile dressings.   [name] tolerated the procedure well without any apparent complications.  [name]  was subsequently extubated and taken to the recovery room in satisfactory  condition.                                               Adolph Pollack, M.D.    Kari Baars  D:  12/05/2003  T:  12/05/2003  Job:  [id number]   cc:   De Blanch, M.D.   Juluis Mire, M.D.  [address] [place]  [place] [address]  Fax: [phone]   Thora Lance, M.D.  [address]  [place]  [place] [address]  Fax: [phone]

## 2010-09-07 NOTE — Op Note (Signed)
[facility]  Patient:    [name], [name] Visit Number: [id number] MRN: [id number]          Service Type: NES Location: NESC Attending Physician:  Trisha Mangle Dictated by:   Veverly Fells Vernie Ammons, M.D. Proc. Date: 08/10/01 Admit Date:  08/10/2001   CC:         Freddy Finner, M.D.   Operative Report  PREOPERATIVE DIAGNOSIS:  Stress urinary incontinence.  POSTOPERATIVE DIAGNOSIS:  Stress urinary incontinence.  PROCEDURE:  SPARC suprapubic urethral suspension.  SURGEON:  Mark C. Vernie Ammons, M.D.  ANESTHESIA:  General.  DRAINS:  None.  SPECIMENS:  None.  ESTIMATED BLOOD LOSS:  Approximately 5 cc.  COMPLICATIONS:  None.  INDICATIONS:  The patient is a 55 year old white female, with significant stress urinary incontinence that began after the birth of her second child. She has tried Kegel exercises without improvement and wears a pad for protection.  Her incontinence occurs with coughing and laughing.  She was found on exam to have moderate urethral hypermobility by Q-tip test, otherwise normal anatomy.  She understands the risks, complications and alternatives to this procedure and has elected to proceed with surgical correction.  DESCRIPTION OF OPERATION:  After informed consent, the patient brought to the major OR, placed on the table, and administered general anesthesia, then moved to the dorsal lithotomy position.  Genitalia, vagina, and lower abdomen were sterilely prepped and draped, and a 16 French Foley catheter was placed in the bladder.  The bladder was drained.  The mid urethral region was palpated, and then the subvaginal mucosa was infiltrated with lidocaine with epinephrine.  I then made an incision in the anterior vaginal wall over the mid urethral region, dissected out laterally slightly, and was easily able to palpate the catheter within the urethra.  I then made two separate stab incisions in the suprapubic region three  fingerbreadths apart just superior to the upper border of the symphysis pubis.  I did palpate a right inguinal hernia and defect and felt no bowel within the defect.  I did, however, place the patient in Trendelenburg position before passing the trocars.  I then completely drained the bladder and placed the cystoscope sheath within the urethra and moved the bladder neck away from the side I was passing the trocar, first passing the trocar on the right hand side through the suprapubic incision behind the symphysis pubis and out through the vaginal incision at the mid urethral level.  This was then performed on the contralateral side, and I then inserted the 70 degree lens and the cystoscope sheath and inspected the bladder.  It was noted to be free of any tumor, stones, or inflammatory lesions.  There was no evidence of perforation or injury to the bladder.  I then drained the bladder again, replaced the Foley catheter, and placed the sling material on each of the passing needles and brought these up through the abdominal incision.  With the catheter in place and a 22 French beneath the urethra, I then placed the sling under no tension over the urethra with no kinking and removed the protective sheath.  I excised the redundant sling material at the abdominal level and then again noted good position.  I then irrigated copiously the vaginal area and incision with antibiotic solution as well as the two suprapubic incisions.  Finally, the vaginal incision was closed with a running 2-0 Vicryl suture.  The suprapubic incisions were then closed with Dermabond, and the bladder was drained,  and the patient was awakened and taken to the recovery room in stable satisfactory condition.  She tolerated the procedure well.  There were no intraoperative complications, and sponge, needle, and instrument counts were correct x 2 at the end of the operation.  The patient will be given a prescription for #28  Vicodin ES and 1 g of Duricef to take b.i.d. for 3 days.  She will then follow up in my office in one week. Dictated by:   Veverly Fells Vernie Ammons, M.D. Attending Physician:  Trisha Mangle DD:  08/10/01 TD:  08/10/01 Job: [id number]

## 2010-09-07 NOTE — Consult Note (Signed)
   [name], [name]                            ACCOUNT NO.:  [id number]   MEDICAL RECORD NO.:  [id number]                   PATIENT TYPE:  OUT   LOCATION:  GYN                                  FACILITY:  [facility]   PHYSICIAN:  De Blanch, M.D.         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   Since her last visit she has done well.  She denies any GI or GU symptoms.  Has no pelvic pain, pressure, vaginal bleeding, or discharge.  She had a CT  scan in anticipation of today's visit which was entirely normal.  Her  functional status is excellent.  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   FAMILY HISTORY:  Reviewed and unchanged.   SOCIAL HISTORY:  Reviewed and unchanged.   REVIEW OF SYMPTOMS:  No GI, GU, cardiovascular, pulmonary, neurologic, or  musculoskeletal symptoms.   PHYSICAL EXAMINATION:  VITAL SIGNS:  Weight 198 pounds, blood pressure  120/76.  GENERAL:  The patient is a healthy white female in no acute distress.  HEENT:  Negative.  NECK:  Supple without thyromegaly.  There is no supraclavicular, axillary,  or inguinal adenopathy.  ABDOMEN:  Soft, nontender, no masses, organomegaly, ascites, or hernias  noted.  PELVIC:  EGBUS, vagina, bladder, and urethra are normal.  Cervix is  surgically absent.  Bimanual and rectovaginal examination reveal no masses,  induration, or nodularity.  EXTREMITIES:  Lower extremities without edema or varicosities.    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  The patient remains without evidence of disease.   PLAN:  The patient will return in six  months, and at that time we will plan  a CT scan for continuing followup.                                               De Blanch, M.D.    DC/MEDQ  D:  02/24/2002  T:  02/24/2002  Job:  [id number]   cc:   Freddy Finner, M.D.  [address] [place]  [place]  [place] [address]  Fax: [phone]   Telford Nab, R.N.  [address] [place], [place] [address]  Fax: 1

## 2010-09-07 NOTE — Consult Note (Signed)
[facility]  Patient:    [name], RYCE Visit Number: [id number] MRN: [id number]          Service Type: GON Location: GYN Attending Physician:  Jeannette Corpus Dictated by:   Rande Brunt. Clarke-Pearson, M.D. Proc. Date: 02/25/01 Admit Date:  02/25/2001   CC:         Freddy Finner, M.D.  Leighton Roach. Truett Perna, M.D.  Telford Nab, R.N.   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  She returns for a postoperative checkup, having had an intraperitoneal Port-A-Cath removed on September 25th. Subsequent to the removal, she has had a nice family trip to [place]. She has had no problems or complications associated with removal of the Port-A-Cath.  From a gynecologic point of view, she denies any pelvic pain, pressure, GI or GU symptoms.  It is noted that her last surveillance CAT scan was in September and at that time was normal except for some ventral hernias.  PHYSICAL EXAMINATION:  VITAL SIGNS:  Weight 187 pounds.  ABDOMEN:  Soft and nontender.  Her Port-A-Cath incision is well-healed.  The rest of the abdomen is soft and nontender.  No masses, organomegaly, ascites or herniae are noted.  IMPRESSION:  Good healing following Port-A-Cath removal.  It is noted the patients blood counts are good although her hemoglobin is only 10.5 and she is continuing to take Ferro-Sequels as an iron supplementation; she does not wish to use Procrit.  PLAN:  The patient can return to full levels of activity.  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  She will have a repeat CT scan of the abdomen and pelvis in December and I will see her shortly after that for further evaluation and discussion. Dictated by:   Rande Brunt. Clarke-Pearson, M.D. Attending Physician:  Jeannette Corpus DD:  02/25/01 TD:  02/26/01 Job:  [id number]

## 2010-09-07 NOTE — Consult Note (Signed)
[facility]  Patient:    [name], [name] Visit Number: [id number] MRN: [id number]          Service Type: GON Location: GYN Attending Physician:  Jeannette Corpus Dictated by:   Rande Brunt. Clarke-Pearson, M.D. Proc. Date: 12/24/00 Admit Date:  11/05/2000 Discharge Date: 11/05/2000   CC:         Telford Nab, R.N.  Abner Greenspan, M.D., [place], [place]  W. Varney Baas, M.D.  Clance Boll, M.D., [facility]   Consultation Report  HISTORY OF PRESENT ILLNESS:  [age]-year-old white female returns for continuing follow-up of recurrent granulosus cell tumor of the ovary.  She continues to have some right upper quadrant pain and discomfort and also pressure in the lower abdomen in the region of her ventral hernia.  Her appetite is improving.  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  She had a CT scan obtained in June which showed an adrenal mass, ventral hernias, and thickening of the distal gastric wall suggestive of gastritis. The patient is desirous of having her Port-A-Cath removed although she is also considering having the ventral hernias repaired at the same time.  PHYSICAL EXAMINATION:  ABDOMEN:  Soft and nontender.  Her Port-A-Cath in the left upper quadrant is in good position.  No evidence of infection is noted.  There is no tenderness in this region.  She has some tenderness in the subcostal region on the right side.  In addition, she has some discomfort in the right lower quadrant.  She does have a palpable hernia in the right lower quadrant.  PELVIC:  Deferred.  NEUROLOGIC:  No supraclavicular or inguinal adenopathy.  LABORATORY DATA:  CT is reviewed with the radiologist.  She does have an anatomic variation with a considerable collection of fat in the subxiphoid region.  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  IMPRESSION: 1. Recurrent granulosus cell tumor, clinically free of disease. 2. Ventral hernias. 3. Desires Port-A-Cath removal.  PLAN:  I had a lengthy discussion with the patient regarding management options at this juncture.  Because of convenience, she is strongly considering having the Port-A-Cath removed under MAC and local anesthesia and deferring repair of her ventral hernias, which will require mesh, until another time. She does not wish to schedule the hernia repair until after she returns from a trip to [place].  We will schedule the removal of the Port-A-Cath for late in September.Dictated y:   Rande Brunt. Clarke-Pearson, M.D. Attending Physician:  Jeannette Corpus DD:  12/24/00 TD:  12/24/00 Job: [phone]

## 2010-09-07 NOTE — H&P (Signed)
[facility]  Patient:    [name], [name]                         MRN: [id number] Adm. Date:  [id number] Disc. 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  HISTORY:  The patient is a 55 year old white married female, gravida 4, para 2, who was first diagnosed with granulosis cell tumor of the ovary in 1996. She had definitive surgery in 2000 when she had total abdominal hysterectomy, right salpingo-oophorectomy, extensive excision of peritoneal granulosis cell tumor and abdominal and pelvic node dissection.  In 2001, she had a recurrent left adnexal mass, which by transcutaneous needle biopsy was benign and was therefore thought to be peritoneal cyst or lymphocyst.  She has been followed with routine CT scans of the abdomen and pelvis and had a CT scan of the abdomen and pelvis in October which showed a 1.6 x .8 cm nodule lateral to the splenic flexure of the colon in the left upper quadrant.  Follow-up CT of the abdomen and pelvis revealed persistence of this mass, which on retrospect even though it was not read previous films as far back as two years ago, was present at that time.  It has increased somewhat in size since that time. Options of simply monitoring and following this versus laparotomy to resect the lesion have been discussed with the patient by Dr. De Blanch. The patient has requested surgical intervention and is admitted at this time for that purpose.  REVIEW OF SYSTEMS:  Her current review of systems is remarkable only for intermittent left upper quadrant pain.  She has intermittent constipation. She has a known history of migraine headache.  PAST MEDICAL HISTORY:  Recorded in detail in the old record and is not significant from the admitting diagnosis of granulosis  cell tumor.  ALLERGIES:  SULFA, CIPRO.  She has no other known significant medical illnesses.  Further history will not be repeated at this time.  PHYSICAL EXAMINATION:  HEENT:  Grossly within normal limits.  VITAL SIGNS:  Blood pressure 108/70.  THYROID GLAND:  Not palpably enlarged.  BREAST EXAM:  Normal.  No skin change, no nipple discharge, no palpable mass.  HEART:  Normal sinus rhythm without murmur, rub or gallop.  CHEST:  Clear to auscultation.  ABDOMEN:  Soft, scaphoid.  There is no appreciable organomegaly.  No CVA tenderness.  EXTREMITIES:  Without clubbing, cyanosis, or edema.  PELVIC:  The external genitalia and vagina are normal.  Bimanual reveals a little thickening of the cuff.  This was first noticed in March 2001. Subsequent pelvic ultrasound revealed no apparent mass.  The adnexa are free of palpable masses.  Rectovaginal exam confirms these findings.  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  PLAN:  Exploratory laparotomy, resection of left upper quadrant nodule, exploration of the abdomen for further evidence of recurrent disease. DD:  04/29/00 TD:  04/29/00 Job: [id number]

## 2010-09-07 NOTE — Consult Note (Signed)
[facility]  Patient:    [name], [name] Visit Number: [id number] MRN: [id number]          Service Type: GON Location: GYN Attending Physician:  Jeannette Corpus Dictated by:   Rande Brunt. Clarke-Pearson, M.D. Proc. Date: 05/13/01 Admit Date:  05/13/2001   CC:         [name], M.D.  [name]. [name], M.D.  [name], R.N.   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  Since her last visit in November, she has done well.  She denies any GI or GU symptoms except for occasional constipation.  She has occasional discomfort in the subcostal regions bilaterally, but this is not persistent. Her functional status is returning to near normal and she is working full-time as well as exercising.  She specifically denies any GI or GU symptoms, or any abdominal distention or other symptoms associated with recurrent disease.  FAMILY HISTORY/SOCIAL HISTORY:  Reviewed and are unchanged.  The patient has two living children.  REVIEW OF SYSTEMS:  Negative.  PHYSICAL EXAMINATION:  VITAL SIGNS:  Weight 188 pounds (stable).  GENERAL:  The patient is a healthy white female in no acute distress.  HEENT:  Negative.  NECK:  Supple without thyromegaly.  There is no supraclavicular or inguinal adenopathy.  ABDOMEN:  Soft and nontender.  Midline incision and subcostal incision are well healed.  She has ventral hernias in the midline incision which remain stable and asymptomatic.  PELVIC:  EGBUS normal.  Vagina is clean and well supported.  Bimanual and rectovaginal exam reveal no masses, induration or nodularity.  IMPRESSION:  Recurrent granulosa cell tumor, clinically free of disease.  The patient had a CAT scan of the abdomen and pelvis and chest on April 17, 2001, which was entirely negative and this is very  reassuring.  It is recalled that the patients tumor markers have never been of any assistance in our followup with the patient.  PLAN:  We will obtain a CBC today.  The patient will return to see Dr. Konrad Dolores for annual examination in April and return to see [name] in six months.  Just prior to that six month visit we will obtain another CAT scan of the chest, abdomen and pelvis. Dictated by:   Rande Brunt. Clarke-Pearson, M.D. Attending Physician:  Jeannette Corpus DD:  05/13/01 TD:  05/13/01 Job: [id number]

## 2010-09-07 NOTE — H&P (Signed)
[facility]  Patient:    [name], [name]                         MRN: [id number] Adm. Date:  [id number] Disc. Date: [id number] Attending:  Jeannette Corpus                         History and Physical  HISTORY OF PRESENT ILLNESS:  A 55 year old white female who is admitted today for rehydration.  She received chemotherapy approximately one week ago at [facility] using intraperitoneal cisplatin and etoposide.  She has had persistent nausea and some vomiting over the past week.  Laboratory work obtained today shows that the patients sodium is 135, potassium 3.6, creatinine 2.5, and BUN 36.  She is unable to retain any oral fluids.  Overall she feels lethargic and fatigued.  PAST MEDICAL HISTORY:  Medical Illness:  Granulosis cell tumor of the hospital ovary, currently receiving chemotherapy for recurrent disease and migraine headaches.  PAST SURGICAL HISTORY:  Abdominal hysterectomy and bilateral salpingo-oophorectomy, secondary tumor debulking, and laparoscopic surgery.  SOCIAL HISTORY:  The patient is married.  She is a hematology/oncology nurse. She does not smoke.  DRUG ALLERGIES:  STADOL, CIPRO, SULFA, and SODIUM SULFATE.  CURRENT MEDICATIONS:  Premarin, Paxil, and Imitrex.  REVIEW OF SYSTEMS:  Essentially negative.  PHYSICAL EXAMINATION:  A well-developed white female who is fatigued and in no acute distress.  WEIGHT:  82 kg.  VITAL SIGNS:  Blood pressure 120/70.  HEENT:  Negative.  She has minimal alopecia.  NECK:  Supple without thyromegaly.  ABDOMEN:  Soft and nontender.  No masses, organomegaly, ascites, or hernias are noted.  Her intraperitoneal Port-A-Cath is in good location in the left upper quadrant.  PELVIC:  Exam is deferred.  EXTREMITIES:  Without edema or varicosities.  LABORATORY DATA:  Laboratory work is reviewed and is as noted in the history of present illness.  PLAN:  The patient will be  admitted for intravenous hydration, antiemetic control, and monitoring for urinary output.  We will plan on repeating electrolytes in the morning. DD:  10/14/00 TD:  10/14/00 Job: [id number]

## 2010-09-07 NOTE — Consult Note (Signed)
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   INTERVAL HISTORY:  Since her last visit, the patient has done well.  She did  have some right upper quadrant pain and was evaluated by a CT scan on  December 28, 2004.  This revealed no evidence of recurrent disease.  The  only significant finding was that of multiple small ventral hernias.   She denies any nausea or vomiting or any change in functional status.   HISTORY OF PRESENT ILLNESS:  The patient has a longstanding history of a  granulosa cell tumor of the ovary.  After her most recent recurrence which  was resected, she received intraperitoneal chemotherapy using cisplatin and  etoposide.  This was completed in June of 2002 and she has had no evidence  of recurrent disease since that time.  We have been following her with  serial CAT scans and serum inhibin A levels.   PAST MEDICAL HISTORY:  Medical illnesses:  None.   PAST SURGICAL HISTORY:  TAH/BSO, secondary tumor debulking in 2002,  laparoscopic repair of ventral hernia, exploratory laparotomy for small  bowel obstruction, intraperitoneal chemotherapy.   DRUG ALLERGIES:  CIPRO, SULFA, SODIUM SULFATE.   CURRENT MEDICATIONS:  Premarin, Paxil, Imitrex p.r.n.   FAMILY HISTORY:  Is negative for gynecologic breast or colon cancer.   REVIEW OF SYSTEMS:  A 10 point comprehensive review of systems is negative  except as noted above.   SOCIAL HISTORY:  The patient is married.  She is a Engineer, civil (consulting) at [employer].  She does not smoke.   PHYSICAL EXAMINATION:  Weight  208 pounds.  GENERAL:  The patient is a moderately obese, white female in no acute  distress.  HEENT:  Negative.  NECK:  Is supple, without thyromegaly.  There is no supraclavicular or  inguinal adenopathy.  ABDOMEN:  Is obese, soft, and non-tender.  No masses, organomegaly or  ascites or hernias are noted.  PELVIC EXAM:  EG, BUS, vagina, bladder, urethra are normal.  The vaginal  cuff is well supported.  No lesions are noted.  Bimanual and rectal vaginal  exam reveal no masses, induration or nodularity.  LOWER EXTREMITIES:  Are without edema, varicosities.   IMPRESSION:  Recurrent granulosa cell tumor, status post complete resection  and 6 cycles of intraperitoneal etoposide and cisplatin chemotherapy.  The  patient remains clinically free of disease and had a normal CT scan 2 weeks  ago.   PLAN:  We will have the patient return to see W. Varney Baas, M.D. for her  annual exam in December of 2006.  At that juncture, we will begin  alternating visits at 6 months intervals.  I will plan in seeing the patient  in June of 2007 and Dr. Jennette Kettle will see the patient in December.  We will  obtain a serum inhibin A today.  We will obtain a CT scan prior to her  return in June of 2007.      De Blanch, M.D.  Electronically Signed     DC/MEDQ  D:  01/09/2005  T:  01/10/2005  Job:  [id number]   cc:   Freddy Finner, M.D.  Fax: [phone]   Telford Nab, R.N.  [address]  [place], [place] [address]

## 2010-09-07 NOTE — Consult Note (Signed)
[name], [name]                            ACCOUNT NO.:  [id number]   MEDICAL RECORD NO.:  [id number]                   PATIENT TYPE:  OUT   LOCATION:  GYN                                  FACILITY:  [facility]   PHYSICIAN:  De Blanch, M.D.         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  She  has had several recurrences, the most recent being treated with surgical  resection and intraperitoneal etoposide and cisplatin.  This therapy was  completed on October 06, 2000.  She has been followed since that time with  serial CAT scans and has had no evidence of recurrent disease.   Currently, the patient's predominant symptom is that of occasional  discomfort in the abdomen.  This may be associated with known ventral  hernia.  She has had some pain with bowel movements, although she reports  this has improved over the last few weeks.  Otherwise, she is doing well.   The patient had a CAT scan on July 26 which showed no evidence of recurrent  disease, although she did have noted ventral hernias present.   REVIEW OF SYSTEMS:  Reviewed and unchanged from previous notations.   FAMILY HISTORY:  Reviewed and unchanged from previous notations.   SOCIAL HISTORY:  Reviewed and unchanged from previous notations.  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   PAST MEDICAL HISTORY:  Reviewed and unchanged from previous notations.   PHYSICAL EXAMINATION:  VITAL SIGNS:  Weight 192 pounds.  GENERAL:  The patient is a healthy white female in no acute distress.  HEENT:  Negative.  NECK:  Supple without thyromegaly.  LYMPH:  There is no supraclavicular or inguinal adenopathy.  ABDOMEN:  Soft, nontender.  No mass, organomegaly, ascites,  or hernias are  noted.  PELVIC:  EGBUS, vagina, bladder, urethra are normal.  Bimanual and  rectovaginal examination reveal no masses, induration, or nodularity.   IMPRESSION:  Recurrent granulosis cell tumor currently clinically free of  disease.   Ventral hernias which are minimally symptomatic.    PLAN:  Would recommend the patient have a follow-up CT scan again in  approximately six months.  In the interval she is considering having ventral  hernias repaired, although this is not definite.  If she wished to have them  repaired in [place], I would be happy to evaluate the patient's  intraperitoneal status at that time.  De Blanch, M.D.    DC/MEDQ  D:  12/01/2002  T:  12/01/2002  Job:  [id number]   cc:   Freddy Finner, M.D.  [address] [place]  [place]  [place] [address]  Fax: [phone]   Telford Nab, R.N.  [address]  [place], [place] [address]

## 2010-09-07 NOTE — Consult Note (Signed)
[facility]  Patient:    [name], [name]                         MRN: [id number] Proc. Date: 05/06/00 Adm. Date:  [id number] Disc. Date: [id number] Attending:  Minette Headland CC:         Freddy Finner, M.D.  Telford Nab, R.N.   Consultation Report  HISTORY:  [age]-year-old white female with recurrent granulosa cell tumors.  She underwent exploratory laparotomy and resection of two gross nodules in the upper abdomen on January 9th.  There was no gross residual disease.  Final pathology shows the omental nodule and the diaphragm nodule contain metastatic granulosa cell tumors; in addition, a peritoneal adhesion contained granulosa cell tumor and peritoneal washings from the upper abdomen and pelvis also had atypical cells consistent with granulosa cell tumors.  The patient has had an uncomplicated postoperative course.  PHYSICAL EXAMINATION  ABDOMEN:  The left subcostal incision looks healthy.  There is no evidence of infection.  Staples are removed and Steri-Strips are applied.  IMPRESSION:  Recurrent granulosa cell tumors with three intraperitoneal metastases as well as positive washings from the upper abdomen and pelvis.  A Port-A-Cath was placed at the time of surgery so that we could perform intraperitoneal therapy.  I had a lengthy discussion with the patient today regarding the rationale for this approach to therapy and the selection of drugs which would include cisplatin and etoposide.  The patient, being a hematology-oncology nurse, is aware of the side-effects of this regimen.  Given her relationship to the working environment here in [place] and in [place], she would like to have her treatment administered at [facility]; we will schedule this for approximately two weeks from now.  All of her questions are answered and in the meantime, she will continue to recover from surgery. DD:  05/06/00 TD:  05/06/00 Job:  [id number]

## 2010-09-07 NOTE — Consult Note (Signed)
   [name], [name]                            ACCOUNT NO.:  [id number]   MEDICAL RECORD NO.:  [id number]                   PATIENT TYPE:  OUT   LOCATION:  GYN                                  FACILITY:  [facility]   PHYSICIAN:  Daniel L. Clarke-Pearson, M.D.      DATE OF BIRTH:  07-07-1955   DATE OF CONSULTATION:  DATE OF DISCHARGE:                                 GYN CONSULTATION   HISTORY OF PRESENT ILLNESS:  The patient is a 55 year old white female  returns for continued and followup of recurrent granulosa cell tumor of the  ovary. Since her last visit, she had some abdominal discomfort in May and  underwent a PET  scan at [facility]. Fortunately the PET scan was  entirely normal.   Since the patient's last visit she reports continued increase in energy. She  feels much better than previously and is quite active. She specifically  denies any GI or GU symptoms, has no abdominal pain or pressure.   REVIEW OF SYSTEMS:  Essentially negative.   FAMILY HISTORY/SOCIAL HISTORY:  Family history and social history are  reviewed and unchanged. She has recently taken a trip to [place] with  her family and enjoyed that tremendously. She is also working full-time at  the [employer].   PHYSICAL EXAMINATION:  VITAL SIGNS:  Weight 197 pounds, blood pressure  124/74.   GENERAL:  The patient is an elderly white female in no acute distress.   HEENT:  Negative.   NECK:  Supple without thyromegaly. There is no supraclavicular, axillary, or  inguinal adenopathy.   ABDOMEN:  Obese, soft, nontender.  No masses or ascites are noted.  She does  seem to have a ventral hernia to the right of the lower abdomen.   PELVIC:  EGBUS, vagina, bladder, and urethra are normal. The vagina is clean  and no lesions noted. Bimanual and rectovaginal examination reveals no  masses, induration or nodularity.   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 The  patient is doing well with no evidence of recurrent disease.   PLAN:  The patient will have a followup abdominal, pelvic and CAT scan in  November and see me shortly thereafter.                                               Daniel L. Stanford Breed, M.D.    DLC/MEDQ  D:  11/18/2001  T:  11/24/2001  Job:  [id number]   cc:   Freddy Finner, M.D.   Dagmar Hait, R.N.   Jillyn Hidden B. ____________, Judie Petit.D.

## 2010-09-07 NOTE — Consult Note (Signed)
[facility]  Patient:    [name], [name]                         MRN: [id number] Proc. Date: 06/12/00 Adm. Date:  [id number] Attending:  Jeannette Corpus CC:         Freddy Finner, M.D.  Abner Greenspan, M.D.  Modesta Messing, M.D.  Telford Nab, R.N.  Dellia Beckwith, M.D.   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  She received her first cycle of chemotherapy approximately three weeks ago at [facility].  She has been followed with weekly laboratory values and she has had no significant nadir accounts. Following her first cycle of chemotherapy she did have a considerable amount of nausea for several days.  She was unable to get her prescription for Zofran filled which most likely aggravated the problem.  She had some abdominal distention.  Over the last eight days she has felt quite well.  She denies any abdominal pain or pressure or any other GI or GU symptoms at the present time.  REVIEW OF SYSTEMS:  Otherwise negative.  She has no neurologic, GI, GU, cardiovascular, or pulmonary symptoms.  PHYSICAL EXAMINATION  VITAL SIGNS:  Weight 186 pounds (stable), blood pressure 112/80.  GENERAL:  The patient is a pleasant young woman in no acute distress.  HEENT:  Negative.  NECK:  Supple without thyromegaly.  ABDOMEN:  Soft and nontender.  Her left subcostal incision is well healed. The Port-A-Cath is in good position, easily palpable, and there is no evidence of infection.  The remainder of the abdomen is soft, nontender.  No mass, organomegaly, ascites, or hernias are noted.  PELVIC:  Deferred today.  LABORATORIES:  Reviewed.  White count 2700, hematocrit 35%, platelet count 447,000.  The patients creatinine is 0.7 (stable).  The patient is scheduled for  chemotherapy on Friday at [facility]. At this juncture her absolute neutrophil count may be just borderline for continued chemotherapy this week with only an ANC of 1026.  These will be repeated again on Friday prior to her chemotherapy.  We will be more aggressive in using antiemetics on this treatment cycle. DD:  06/11/00 TD:  06/12/00 Job: [id number]

## 2010-09-07 NOTE — H&P (Signed)
[name], [name]                            ACCOUNT NO.:  [id number]   MEDICAL RECORD NO.:  [id number]                   PATIENT TYPE:  AMB   LOCATION:  DAY                                  FACILITY:  [facility]   PHYSICIAN:  Adolph Pollack, M.D.            DATE OF BIRTH:  10-25-1955   DATE OF ADMISSION:  12/05/2003  DATE OF DISCHARGE:                                HISTORY & PHYSICAL   REASON FOR ADMISSION:  Elective repair of ventral hernia.   HISTORY OF PRESENT ILLNESS:  [name] is a 55 year old female with a history  of ovarian cancer.  She has had exploratory laparotomy before.  She has had  some discomfort and lower incision bulging consistent with palpable fascial  defects.  I have seen her in the past and we discussed elective laparoscopic  ventral hernia repair in the fall of 2004.  She subsequent has decided to go  ahead and have the operation done.   PAST MEDICAL HISTORY:  1. Gastrointestinal reflux disease.  2. Migraine headaches.  3. Ovarian cancer.  4. Cervical neck syndrome.  5. Herniated disk of lumbar spine.   PREVIOUS OPERATIONS:  1. Appendectomy.  2. Laparoscopic oophorectomy.  3. Hysterectomy.  4. Exploratory laparotomy x 3.  5. Removal of left upper quadrant abdominal wall mass.  6. Port-A-Cath for central peritoneal chemotherapy.   ALLERGIES:  1. SULFA.  2. CODEINE.  3. CIPRO.  4. STADOL.  5. VICODIN.   MEDICATIONS:  1. Premarin 0.625 mg daily.  2. Lexapro one daily.  3. Prevacid p.r.n.   SOCIAL HISTORY:  No tobacco use.  She occasionally has a glass of wine.  She  is married.   REVIEW OF SYSTEMS:  CARDIAC:  No known heart disease or hypertension.  PULMONARY:  No chronic lung disease.  NEUROLOGIC:  As per past medical  history.   PHYSICAL EXAMINATION:  GENERAL APPEARANCE:  A well-developed, well-nourished  female in no acute distress.  Pleasant and cooperative.  HEENT:  Eyes:  Extraocular motions intact.  NECK:  Supple without palpable  mass.  RESPIRATORY:  Breath sounds equal and clear.  Respirations unlabored.  CARDIOVASCULAR:  Regular rate and rhythm.  No murmur heard.  ABDOMEN:  Soft, nontender and nondistended.  There is a right lower quadrant  scar, a left upper quadrant scar and a midline scar with some palpable  fascia defects present.  Active bowel sounds noted.  EXTREMITIES:  Full range of motion.  No cyanosis or edema.   IMPRESSION:  Ventral incisional hernia.  The patient is status post multiple  operations for ovarian cancer.   PLAN:  Laparoscopic ventral incisional hernia repair with mesh.  The  procedure and the risks have been discussed with her previously.  Adolph Pollack, M.D.    [name]  D:  12/05/2003  T:  12/05/2003  Job:  [id number]

## 2010-09-07 NOTE — Discharge Summary (Signed)
NAMEICIE, KUZNICKI                            ACCOUNT NO.:  [id number]   MEDICAL RECORD NO.:  [id number]                   PATIENT TYPE:  INP   LOCATION:  0451                                 FACILITY:  [facility]   PHYSICIAN:  Adolph Pollack, M.D.            DATE OF BIRTH:  [birth year]   DATE OF ADMISSION:  12/26/2003  DATE OF DISCHARGE:  01/03/2004                                 DISCHARGE SUMMARY   PRINCIPAL DISCHARGE DIAGNOSIS:  Small bowel obstruction.   SECONDARY DIAGNOSES:  1.  Ovarian cancer.  2.  Migraine headaches.  3.  Gastroesophageal reflux disease.  4.  Ventral hernia.   PROCEDURE:  Exploratory laparotomy, lysis of adhesions on December 26, 2003,  by Dr. Ovidio Kin.   REASON FOR ADMISSION:  This is a 55 year old female whom I performed a  laparoscopic ventral hernia repair on December 05, 2003.  She did very well  from that.  However, approximately three to four weeks after that she  developed some acute pain in the upper abdomen accompanied by nausea, and  the pain was increasing.  She was seen by Dr. Ezzard Standing in the emergency  department.  CT scan was suggestive of partial small bowel obstruction, and  because of her increasing pain she was taken to the operating room by Dr.  Ezzard Standing.   HOSPITAL COURSE:  She underwent the above operation and was found to have  what appeared to be some thickened small bowel secondary to perineal  chemotherapy, and also there appeared to be a point of obstruction superior  to where the mesh was placed in an area that had been dissected before.  In  hindsight, speaking with her, she said she had an episode similar to this  one in the past, but had not mentioned it to anybody before.  She did  develop a postoperative ileus which slowly did resolve with the help of some  Reglan.  On her seventh postoperative day, she had a little fever overnight,  did not feel quite as well, and had not been taking much pain medication.  I  went ahead  and ordered a CBC which demonstrated a normal white blood cell  count, hemoglobin of 11.7.  UA was negative.  Chest x-ray no infiltrates.  CT scan showed no abscess and just routine postoperative changes.  By the  next day, she was feeling much better, was afebrile, had been taking her  pain medication a little bit more frequently, and was ready to be  discharged.  Staples were to be removed.   DISPOSITION:  Discharged to home in satisfactory condition on January 03, 2004.   ACTIVITY:  She will continue her same activity restrictions as she did  initially after the ventral hernia repair, that is, no heavy lifting,  straining, driving, for a week.   DIET:  As tolerated.   DISCHARGE MEDICATIONS:  She will be given  Ultram for pain and told to take  her old medications.   FOLLOWUP:  I plan to see her back in the office in about two weeks.                                               Adolph Pollack, M.D.    Kari Baars  D:  01/03/2004  T:  01/03/2004  Job:  [id number]   cc:   Thora Lance, M.D.  [address] [place]  [place] [address]  Fax: [phone]   De Blanch, M.D.

## 2010-09-07 NOTE — Consult Note (Signed)
[facility] [place]  Patient:    [name], [name]                           MRN: [id number] Proc. Date: 04/23/00 Attending:  Rande Brunt. Stanford Breed, M.D. CC:         Freddy Finner, M.D.             Telford Nab, R.N.                          Consultation Report  HISTORY OF PRESENT ILLNESS:  [age]-year-old white female returns for reassessment of possible recurrent granulosa cell tumor.  She underwent secondary resection of pelvic peritoneal implants including a total abdominal hysterectomy, right salpingo-oophorectomy, and partial omentectomy, and pelvic and periaortic lymphadenectomy on April 26, 1998.  Since that time she has been followed by Dr. Varney Baas with serial CT scans.  In October and again on April 08, 2000, she has been found to have a lesion near the splenic flexure measuring 1.8 x 1.7 cm.  I have reviewed her prior scans with Dr. Myles Rosenthal in the department of radiology, and we note that, actually, a smaller lesion was present as far back as December 1999.  The patient herself reports that she has left upper quadrant pain which is intermittent.  She denies any GI or GU symptoms.  She does note some bulging in the right lower quadrant lateral to her midline incision.  Her functional status is excellent.  REVIEW OF SYSTEMS:  Essentially negative.  FAMILY HISTORY, SOCIAL HISTORY:  Reviewed.  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  PHYSICAL EXAMINATION:  GENERAL:  Moderately obese white female in no acute distress.  HEENT:  Negative.  NECK:  Supple.  Without thyromegaly.  ABDOMEN:  Soft and nontender.  No masses or organomegaly are noted.  She does seem to have a slight fascial defect protruding to the right of the midline incision.  No specific hernia ring can be palpated.  (On review of the CT scan, a slight fascial defect is also noted.)  PELVIC:  EGBUS normal.  Vagina is clean and well  supported.  BIMANUAL, RECTOVAGINAL:  No masses, induration, or nodularity.  EXTREMITIES:  Lower extremities without edema or varicosities.  IMPRESSION:  History of metastatic granulosa cell tumor with a slightly increasing mass in the left upper quadrant near the splenic flexure.  This may well be symptomatic, given her current symptoms of left upper quadrant pain.  PLAN:  I think it is reasonable to recommend that the patient undergo exploratory laparotomy to define the nature of this mass.  Certainly, if this is a granulosa cell tumor that is recurrent, surgical resection offers [name] our best chance at prolonged palliation.  The pros and cons of beginning with the laparoscopic approach were discussed with the patient.  Given her prior extensive surgery and the location of the mass, I think little would be gained by performing laparoscopy.  Therefore, I would recommend that she undergo exploratory laparotomy through an incision in the subcostal region on the left.  We will explore the abdomen at that same time and obtain washings.  I have discussed this at length with the patient, and she is in agreement to proceed with surgery, and this is scheduled for April 30, 2000.DD:  04/23/00 TD:  04/23/00 Job: [id number]

## 2010-09-07 NOTE — Op Note (Signed)
[name], [name]                            ACCOUNT NO.:  [id number]   MEDICAL RECORD NO.:  [id number]                   PATIENT TYPE:  INP   LOCATION:  0451                                 FACILITY:  [facility]   PHYSICIAN:  Sandria Bales. Ezzard Standing, M.D.               DATE OF BIRTH:  1956/01/11   DATE OF PROCEDURE:  12/26/2003  DATE OF DISCHARGE:                                 OPERATIVE REPORT   PREOPERATIVE DIAGNOSIS:  Small bowel obstruction, etiology unclear, status  post recent laparoscopic ventral hernia repair.   POSTOPERATIVE DIAGNOSIS:  Small bowel obstruction secondary to adhesions  (possible at two points), intact ventral hernia repair, three small hernias  (1-1.5 cm) cephalad to ventral hernia repair, thickened small bowel wall  possibly secondary to prior intraperitoneal chemotherapy.   PROCEDURE:  Enterolysis of adhesions, and this was combined (this was a  laparoscopic converted to open procedure).   SURGEON:  Sandria Bales. Ezzard Standing, M.D.   FIRST ASSISTANT:  Abigail Miyamoto, M.D.   ANESTHESIA:  General endotracheal.   ESTIMATED BLOOD LOSS:  150 mL.   DRAINS LEFT IN:  None.   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  She had developed an abdominal hernia, underwent a recent  laparoscopic ventral hernia repair by Dr. Avel Peace, had an uneventful  course until today, when she had increasing or crescendo abdominal pain  which came in waves, with a CT scan suggesting a bowel obstruction.  I  discussed with her about proceeding with surgery and the complications of  surgery include but not limited to bleeding, infection, the need for open  surgery.  We would start out laparoscopically, though I was not sure whether  I would be able to get in the same plane or field that Dr. Abbey Chatters  was  able to.   The patient was placed in the supine position, her left arm was tucked, her  right arm was out to her side.  She had a Foley catheter in place.  She was  given 1 g of Cefotetan at the initiation of the procedure and her upper  abdomen prepped with Betadine solution and sterilely draped.  I went through  an upper midline incision and got down to the peritoneal cavity though it  was somewhat hard getting in.  I was able to get a Hasson trocar and slip a  laparoscope, first down her left gutter, secondly down her right gutter.  I  saw no obvious ischemic bowel but really did not get a very good view of  anything.  She did have a little bit of fluid that we saw, but there was  clearly stuff stuck around the edge of her recent  laparoscopic hernia  repair.  I felt uncomfortable with her presenting symptoms that my  laparoscopic exam was adequate enough, so I then extended it through an  upper midline incision about 13 cm long.  I carried it to maybe about 2 cm  above the upper edge of the Parietex mesh.  I was able to slowly lyse  adhesions.  There were adhesions along the edge of the Parietex mesh,  particularly between about 12 o'clock to 4 o'clock on the left side and then  down at about 6 o'clock to 9 o'clock on the right side, the lower end of the  mesh.  The mesh actually was entirely intact.  There was no evidence of any  hernia around the mesh, though she did have three small hernias in her upper  midline.  Each of these hernias were maybe 1-1.5 cm in size.  I did not see  any bowel in this, but about 3 cm above the Parietex she did have a piece of  small bowel really stuck up tight against the abdominal wall right at one of  these hernias.  Again, I do not think the bowel was in the hernia but still  I think this was at least one point of obstruction.  The second was she had  a tight distal small bowel where the small bowel was stuck to the right  lower quadrant of the  mesh at about the 6 to 9 o'clock position.  This bowel  was somewhat thickened from prior intraperitoneal chemotherapy but also was  tight.  I think this is part of the second point of obstruction.  I lysed  these adhesions from the ligament of Treitz to the terminal ileum.  Again,  her distal one-half of her bowel had changes with a kind of thickened wall  consistent with the prior intraperitoneal chemotherapy but had no  neovascularization like radiation therapy sometimes gets.  I felt that we  ran the bowel completely.  There was no evidence of bowel injury.  There was  no evidence of any point of obstruction after I had taken the whole small  bowel down.  I actually thought that her Parietex was fairly well already  incorporated and fairly smooth to palpation, that there were no exposed  staples now or mesh, and so I did not feel there was any reason to put  something like Sepra as a barrier to see if it would prevent from sticking  from this.  I did use the very upper piece of the mesh.  I actually pulled  to cover the lowest small hernia because it was only about a centimeter  above the upper area of the Parietex.  The other two hernias I had cut  through, so I had closed those hernias in the primary closure.   I then closed her abdomen with interrupted #1 Novofil sutures.  I closed the  skin with a skin gun.  I irrigated the wound, placed an NG tube, was able to  palpate the stomach, and transported her to the recovery room after surgery.  Her sponge and needle count were correct at the end of the case.  She  tolerated the procedure well.                                               Sandria Bales. Ezzard Standing,  M.D.    DHN/MEDQ  D:  12/26/2003  T:  12/27/2003  Job:  [id number]   cc:   Adolph Pollack, M.D.  [address], [address]  [place]  [place] [address]  Fax: [phone]   De Blanch, M.D.   Juluis Mire, M.D.  [address] [place] [address]   Fax: [phone]   Thora Lance, M.D.  [address]  [place]  [place] [address]  Fax: [phone]   Telford Nab, R.N.  [phone] [address]  [place], [place] [address]

## 2010-10-10 ENCOUNTER — Other Ambulatory Visit: Payer: Self-pay | Admitting: Dermatology

## 2011-01-23 LAB — GLUCOSE, CAPILLARY: Glucose-Capillary: 110 — ABNORMAL HIGH

## 2011-01-31 ENCOUNTER — Other Ambulatory Visit: Payer: Self-pay | Admitting: Gynecology

## 2011-01-31 DIAGNOSIS — C569 Malignant neoplasm of unspecified ovary: Secondary | ICD-10-CM

## 2011-02-15 ENCOUNTER — Encounter (HOSPITAL_COMMUNITY)
Admission: RE | Admit: 2011-02-15 | Discharge: 2011-02-15 | Disposition: A | Discharge status: Home / Self Care | Payer: 59 | Source: Ambulatory Visit | Attending: Gynecology | Admitting: Gynecology

## 2011-02-15 ENCOUNTER — Encounter (HOSPITAL_COMMUNITY): Payer: Self-pay

## 2011-02-15 DIAGNOSIS — C569 Malignant neoplasm of unspecified ovary: Secondary | ICD-10-CM | POA: Insufficient documentation

## 2011-02-15 DIAGNOSIS — M799 Soft tissue disorder, unspecified: Secondary | ICD-10-CM | POA: Insufficient documentation

## 2011-02-15 LAB — GLUCOSE, CAPILLARY: Glucose-Capillary: 111 mg/dL — ABNORMAL HIGH (ref 70–99)

## 2011-02-15 MED ORDER — FLUDEOXYGLUCOSE F - 18 (FDG) INJECTION
18.6000 | Freq: Once | INTRAVENOUS | Status: AC | PRN
  Administered 2011-02-15: 18.6 via INTRAVENOUS

## 2011-02-20 ENCOUNTER — Ambulatory Visit: Payer: 59 | Attending: Gynecology | Admitting: Gynecology

## 2011-02-20 DIAGNOSIS — Z9079 Acquired absence of other genital organ(s): Secondary | ICD-10-CM | POA: Insufficient documentation

## 2011-02-20 DIAGNOSIS — C569 Malignant neoplasm of unspecified ovary: Secondary | ICD-10-CM | POA: Insufficient documentation

## 2011-02-20 DIAGNOSIS — Z79899 Other long term (current) drug therapy: Secondary | ICD-10-CM | POA: Insufficient documentation

## 2011-02-20 DIAGNOSIS — E669 Obesity, unspecified: Secondary | ICD-10-CM | POA: Insufficient documentation

## 2011-02-20 DIAGNOSIS — Z90710 Acquired absence of both cervix and uterus: Secondary | ICD-10-CM | POA: Insufficient documentation

## 2011-02-22 NOTE — Consult Note (Signed)
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  INTERVAL HISTORY:  The patient returns today for continued followup as previously scheduled.  She had a PET/CT scan on October 26th, which revealed stable disease in the area of Morison pouch, which appears stable and measures 1.4 x 1.8 cm.  The patient denies any abdominal, GI, or GU symptoms.  She has no pelvic pain, pressure, vaginal bleeding, or discharge.  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  HISTORY OF PRESENT ILLNESS:  The patient has a longstanding history of granulosa cell tumor of the ovary.  She had a recurrence in 2002 in the upper abdomen which was resected, and the patient subsequently received 6 cycles of intraperitoneal cisplatin and etoposide, completed in June 2002.  She was followed between 2002 and 2010 with CT scans.  In November 2010, CT showed increased size of nodules in the pelvis and at that time, she underwent exploratory laparotomy with resection of tumor nodules near the cecum and left pelvic sidewall.  The upper abdomen was explored at that time, it showed no evidence of disease.  Tumor was estrogen receptor negative, progesterone receptor positive. Approximately a year ago, she discontinued use of hormone replacement therapy (Premarin).  PAST MEDICAL HISTORY/MEDICAL ILLNESSES:  Obesity.  PAST SURGICAL HISTORY:  TAHBSO, secondary tumor debulking in 2002, laparoscopic ventral hernia repair, re-exploration for bowel obstruction, tumor debulking in 2010.  DRUG ALLERGIES:  CIPRO, SULFA, and SODIUM SULFATE.  CURRENT MEDICATIONS:  Paxil, Imitrex p.r.n.  migraines.  FAMILY HISTORY:  Negative for gynecologic, breast, or colon cancer.  SOCIAL HISTORY:  The patient is married.  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  REVIEW OF SYSTEMS:  Ten-point comprehensive review of systems negative except as noted above.  PHYSICAL EXAMINATION:  VITAL SIGNS:  Weight 220 pounds, blood pressure 110/70.  Remainder of vital signs are in the patient's record. GENERAL:  The patient is a healthy white female, in no acute distress. HEENT:  Negative. NECK:  Supple without thyromegaly.  There is no supraclavicular or inguinal adenopathy. ABDOMEN:  Soft, nontender.  No mass, organomegaly, ascites, or hernias are noted. PELVIC:  EGBUS, vagina, bladder, urethra are normal.  Cervix and uterus are surgically absent.  Adnexa without masses.  Rectovaginal exam confirms. EXTREMITIES:  Lower extremities without edema or varicosities.  IMPRESSION:  Recurrent granulosa cell tumor with apparent nodule in the right upper abdomen.  This is stable over the last 6 months.  The patient is entirely asymptomatic.  We will therefore continue to follow her.  I would recommend we repeat a CT scan in 6 months.  Given the fact that the tumor does not seem to be hypermetabolic on PET scan, we will not perform a PET scan, but simply rely on CT scan measurements.  De Blanch, M.D.     DC/MEDQ  D:  02/20/2011  T:  02/20/2011  Job:  [id number]  cc:   [name], M.D. Fax: [phone]  Telford Nab, R.N. [address] [place], [place] [address]  Electronically Signed by De Blanch M.D. on 02/22/2011 09:37:43 AM

## 2011-03-29 ENCOUNTER — Ambulatory Visit (HOSPITAL_COMMUNITY)
Admission: RE | Admit: 2011-03-29 | Discharge: 2011-03-29 | Disposition: A | Discharge status: Home / Self Care | Payer: 59 | Source: Ambulatory Visit | Attending: Internal Medicine | Admitting: Internal Medicine

## 2011-03-29 ENCOUNTER — Other Ambulatory Visit (HOSPITAL_COMMUNITY): Payer: Self-pay | Admitting: Internal Medicine

## 2011-03-29 DIAGNOSIS — R52 Pain, unspecified: Secondary | ICD-10-CM

## 2011-03-29 DIAGNOSIS — M25559 Pain in unspecified hip: Secondary | ICD-10-CM | POA: Insufficient documentation

## 2011-08-14 ENCOUNTER — Telehealth: Payer: Self-pay | Admitting: *Deleted

## 2011-08-14 ENCOUNTER — Encounter: Payer: Self-pay | Admitting: *Deleted

## 2011-08-14 NOTE — Progress Notes (Signed)
auth PRN refill on Premarin0.625 one po qd to [facility] op pharmacy

## 2011-08-19 NOTE — Telephone Encounter (Signed)
error 

## 2011-09-19 ENCOUNTER — Other Ambulatory Visit: Payer: Self-pay | Admitting: *Deleted

## 2011-09-19 DIAGNOSIS — C569 Malignant neoplasm of unspecified ovary: Secondary | ICD-10-CM

## 2011-09-24 ENCOUNTER — Ambulatory Visit (HOSPITAL_COMMUNITY)
Admission: RE | Admit: 2011-09-24 | Discharge: 2011-09-24 | Disposition: A | Discharge status: Home / Self Care | Payer: 59 | Source: Ambulatory Visit | Attending: Gynecologic Oncology | Admitting: Gynecologic Oncology

## 2011-09-24 DIAGNOSIS — Z9079 Acquired absence of other genital organ(s): Secondary | ICD-10-CM | POA: Insufficient documentation

## 2011-09-24 DIAGNOSIS — Z90710 Acquired absence of both cervix and uterus: Secondary | ICD-10-CM | POA: Insufficient documentation

## 2011-09-24 DIAGNOSIS — I700 Atherosclerosis of aorta: Secondary | ICD-10-CM | POA: Insufficient documentation

## 2011-09-24 DIAGNOSIS — K5730 Diverticulosis of large intestine without perforation or abscess without bleeding: Secondary | ICD-10-CM | POA: Insufficient documentation

## 2011-09-24 DIAGNOSIS — K5900 Constipation, unspecified: Secondary | ICD-10-CM | POA: Insufficient documentation

## 2011-09-24 DIAGNOSIS — C569 Malignant neoplasm of unspecified ovary: Secondary | ICD-10-CM | POA: Insufficient documentation

## 2011-09-24 DIAGNOSIS — K669 Disorder of peritoneum, unspecified: Secondary | ICD-10-CM | POA: Insufficient documentation

## 2011-09-24 DIAGNOSIS — N281 Cyst of kidney, acquired: Secondary | ICD-10-CM | POA: Insufficient documentation

## 2011-09-24 DIAGNOSIS — R918 Other nonspecific abnormal finding of lung field: Secondary | ICD-10-CM | POA: Insufficient documentation

## 2011-09-24 DIAGNOSIS — J9819 Other pulmonary collapse: Secondary | ICD-10-CM | POA: Insufficient documentation

## 2011-09-24 DIAGNOSIS — D3500 Benign neoplasm of unspecified adrenal gland: Secondary | ICD-10-CM | POA: Insufficient documentation

## 2011-09-24 MED ORDER — IOHEXOL 300 MG/ML  SOLN
100.0000 mL | Freq: Once | INTRAMUSCULAR | Status: AC | PRN
  Administered 2011-09-24: 100 mL via INTRAVENOUS

## 2011-09-26 ENCOUNTER — Encounter: Payer: Self-pay | Admitting: Gynecologic Oncology

## 2011-09-30 ENCOUNTER — Ambulatory Visit: Payer: 59 | Attending: Gynecology | Admitting: Gynecology

## 2011-09-30 ENCOUNTER — Encounter: Payer: Self-pay | Admitting: Gynecology

## 2011-09-30 VITALS — BP 122/68 | HR 68 | Temp 98.2°F | Resp 16 | Ht 66.0 in | Wt 218.4 lb

## 2011-09-30 DIAGNOSIS — Z79899 Other long term (current) drug therapy: Secondary | ICD-10-CM | POA: Insufficient documentation

## 2011-09-30 DIAGNOSIS — K219 Gastro-esophageal reflux disease without esophagitis: Secondary | ICD-10-CM | POA: Insufficient documentation

## 2011-09-30 DIAGNOSIS — C569 Malignant neoplasm of unspecified ovary: Secondary | ICD-10-CM

## 2011-09-30 DIAGNOSIS — Z90710 Acquired absence of both cervix and uterus: Secondary | ICD-10-CM | POA: Insufficient documentation

## 2011-09-30 NOTE — Patient Instructions (Signed)
Begin tamoxifen 10 mg twice a day for 14 days and then take Megace 40 mg twice a day for 14 days continue alternating these 2 drugs for the next 3 months. We will obtain a CT scan in 3 months and then I will see you to review the CT scan.

## 2011-09-30 NOTE — Progress Notes (Addendum)
Consult Note: Gyn-Onc   [name] 56 y.o. female  Chief Complaint  Patient presents with  . Ovarian Cancer    Follow up    Interval History: Patient returns today as previously scheduled. She had a CT scan of the abdomen and pelvis on 09/24/2011 which, unfortunately, shows progressive disease. Specifically, there are 3 distinct peritoneal nodules along the medial aspect of the right hepatic lobe. The largest measures 2.0 x 1.9 cm. There is additional tiny nodularity in the left pelvis. The patient reports that she has some discomfort in the right upper quadrant but nothing severe. She has no other GI or GU symptoms except for some constipation. 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  HPI: The patient has a long-standing history of a granulosa cell tumor of the ovary. She had a recurrence in 2002 and the upper abdomen which was resected and she was subsequently treated with 6 cycles of intraperitoneal cisplatin and etoposide completed in June of 2002. She was followed between 2002 in 2010 with CT scans. In November 2010 CT scan showed increased size of nodules in the pelvis. At that time she underwent exploratory laparotomy with resection of tumor nodules near the cecum and left pelvic sidewall.. The tumor was estrogen receptor negative and progesterone receptor positive. Initially she discontinued Premarin but more recently has been taking it.  Review of Systems:10 point review of systems is negative as noted above.   Vitals: Blood pressure 122/68, pulse 68, temperature 98.2 F (36.8 C), resp. rate 16, height 5\' 6"  (1.676 m), weight 218 lb 6.4 oz (99.066 kg).  Physical Exam: General : The patient is a healthy woman in no acute distress.  HEENT: normocephalic, extraoccular movements normal; neck is supple without thyromegally  Lynphnodes: Supraclavicular and inguinal nodes not enlarged  Abdomen: Soft, non-tender, no ascites, no  organomegally, no masses, no hernias  Pelvic:  EGBUS: Normal female  Vagina: Normal, no lesions  Urethra and Bladder: Normal, non-tender  Cervix: Surgically absent  Uterus: Surgically absent  Bi-manual examination: Non-tender; no adenxal masses or nodularity  Rectal: normal sphincter tone, no masses, no blood  Lower extremities: No edema or varicosities. 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 CT scan is reviewed and shows clear-cut progression. The patient is essentially asymptomatic. Given the fact that we do not have clear-cut evidence that chemotherapy is active, other alternative regimens should be considered. There is a case report and summary of other case reports suggesting that alternating tamoxifen 10 mg twice a day for 14 days followed by Megace 40 mg twice a day for 14 days has resulted in response to this regimen.  The patient's in agreement to begin this new regimen and she is given prescriptions. We will plan on seeing her back again in approximately 3 months to see how she is doing and obtain a CT scan at that time. The risks and side effects of tamoxifen and Megace were reviewed. All questions are answered.  Allergies  Allergen Reactions  . Ciprofloxacin Other (See Comments)    FATIGUE  . Codone (Hydrocodone Bitartrate) Itching  . Stadol (Butorphanol Tartrate) Other (See Comments)    ALTERED MENTAL STATUS  . Sulfa Antibiotics Rash    Past Medical History  Diagnosis Date  . Obesity   . Granulosa cell tumor of ovary   . GERD (gastroesophageal reflux disease)     Past Surgical History  Procedure Date  .  Abdominal hysterectomy     TAH/BSO  . Secondary tumor debulking 2002  . Ventral hernia repair   . Tumor debulking 2010  . Exploratory laparotomy     for bowel obstruction    Current Outpatient Prescriptions  Medication Sig Dispense Refill  . acetaminophen (TYLENOL) 325 MG tablet Take 650 mg by mouth  every 6 (six) hours as needed.      . Calcium Carbonate-Vitamin D (CALTRATE 600+D PO) Take by mouth once a week.      . escitalopram (LEXAPRO) 20 MG tablet Take 20 mg by mouth daily.      Marland Kitchen esomeprazole (NEXIUM) 40 MG capsule Take 40 mg by mouth daily before breakfast.      . estrogens, conjugated, (PREMARIN) 0.625 MG tablet Take 0.625 mg by mouth daily. Take daily for 21 days then do not take for 7 days.      . Multiple Vitamins-Minerals (MULTIVITAMIN PO) Take by mouth daily.        History   Social History  . Marital Status: Married    Spouse Name: N/A    Number of Children: N/A  . Years of Education: N/A   Occupational History  . Not on file.   Social History Main Topics  . Smoking status: Never Smoker   . Smokeless tobacco: Not on file  . Alcohol Use: Yes     One 8oz glass  . Drug Use: No  . Sexually Active: Not on file   Other Topics Concern  . Not on file   Social History Narrative  . No narrative on file    Family History  Problem Relation Age of Onset  . Stroke Father       Jeannette Corpus, MD 09/30/2011, 4:41 PM  Reference for treatment plan: Hormonal treatment of a recurrent granulosa cell tumor of the ovary: case report and review of the literature. Susette Racer, R.D., Oswald Hillock., [facility] [phone] volume 96 issue 3 pp [phone].

## 2011-12-18 ENCOUNTER — Ambulatory Visit (HOSPITAL_COMMUNITY)
Admission: RE | Admit: 2011-12-18 | Discharge: 2011-12-18 | Disposition: A | Discharge status: Home / Self Care | Payer: 59 | Source: Ambulatory Visit | Attending: Gynecology | Admitting: Gynecology

## 2011-12-18 ENCOUNTER — Other Ambulatory Visit: Payer: Self-pay | Admitting: *Deleted

## 2011-12-18 DIAGNOSIS — M79609 Pain in unspecified limb: Secondary | ICD-10-CM | POA: Insufficient documentation

## 2011-12-18 DIAGNOSIS — M79605 Pain in left leg: Secondary | ICD-10-CM

## 2011-12-18 NOTE — Progress Notes (Signed)
*  PRELIMINARY RESULTS* Vascular Ultrasound Left lower extremity venous duplex has been completed.  Preliminary findings: Left= no evidence of DVT or baker's cyst.  Attempted to call report to pager number, but no call back. Let patient leave.  Farrel Demark, RDMS, RVT  12/18/2011, 2:41 PM

## 2012-04-09 ENCOUNTER — Other Ambulatory Visit: Payer: Self-pay | Admitting: Gynecologic Oncology

## 2012-04-09 DIAGNOSIS — C569 Malignant neoplasm of unspecified ovary: Secondary | ICD-10-CM

## 2012-04-09 NOTE — Progress Notes (Signed)
Ordered per Dr. Nelwyn Salisbury recommendation from last visit.

## 2012-04-14 ENCOUNTER — Ambulatory Visit (HOSPITAL_COMMUNITY)
Admission: RE | Admit: 2012-04-14 | Discharge: 2012-04-14 | Disposition: A | Discharge status: Home / Self Care | Payer: 59 | Source: Ambulatory Visit | Attending: Gynecologic Oncology | Admitting: Gynecologic Oncology

## 2012-04-14 DIAGNOSIS — C569 Malignant neoplasm of unspecified ovary: Secondary | ICD-10-CM | POA: Insufficient documentation

## 2012-04-14 DIAGNOSIS — R1031 Right lower quadrant pain: Secondary | ICD-10-CM | POA: Insufficient documentation

## 2012-04-14 MED ORDER — IOHEXOL 300 MG/ML  SOLN
100.0000 mL | Freq: Once | INTRAMUSCULAR | Status: AC | PRN
  Administered 2012-04-14: 100 mL via INTRAVENOUS

## 2012-04-24 ENCOUNTER — Telehealth: Payer: Self-pay | Admitting: *Deleted

## 2012-04-24 NOTE — Telephone Encounter (Signed)
Pt notified of CT resluts and Dr clarke-Pearson's recommendation to switch to Letrozole. Rx Letrozole 2.5 mg one po qd # 30/90 per insurance with prn refill.  Discontinue megace and tamoxfen.

## 2012-04-29 ENCOUNTER — Other Ambulatory Visit (HOSPITAL_COMMUNITY): Payer: 59

## 2012-05-13 ENCOUNTER — Other Ambulatory Visit (HOSPITAL_COMMUNITY): Payer: 59

## 2012-05-22 ENCOUNTER — Ambulatory Visit: Payer: 59 | Attending: Gynecology | Admitting: Gynecology

## 2012-05-22 ENCOUNTER — Encounter: Payer: Self-pay | Admitting: Gynecology

## 2012-05-22 VITALS — BP 110/62 | HR 68 | Temp 98.1°F | Resp 16 | Ht 66.0 in | Wt 220.0 lb

## 2012-05-22 DIAGNOSIS — C569 Malignant neoplasm of unspecified ovary: Secondary | ICD-10-CM | POA: Insufficient documentation

## 2012-05-22 NOTE — Patient Instructions (Signed)
Continue letrozole. We'll repeat a CT scan in May of 2014.

## 2012-05-22 NOTE — Progress Notes (Signed)
Consult Note: Gyn-Onc   [name] 57 y.o. female  Chief Complaint  Patient presents with  . ovarian cancer follow-up    Interval History: The patient returns today as previously scheduled for followup. She had a CT scan on 04/14/2012 which, unfortunately, shows progressive disease. Specifically, there is a peritoneal implants in the right upper quadrant measuring 2.9 cm (previously 2.0 cm) there are 3 adjacent nodules which also enlarged. There is an enlarging left external iliac node measuring 1.3 cm and a peritoneal nodule adjacent to the sigmoid colon measuring 1.2 cm. The patient has been alternating every 2 weeks using Megace and tamoxifen. With the progress of disease we have switched her to letrozole which she has been taken for approximately a month. She sees be tolerating s all well.  She denies any significant abdominal pain although after eating and feels "full". She has no other GI or GU symptoms. Her functional status is excellent.  HPI:The patient has a long-standing history of a granulosa cell tumor of the ovary dating back to 41. She had a recurrence in 2002 and the upper abdomen which was resected and she was subsequently treated with 6 cycles of intraperitoneal cisplatin and etoposide completed in June of 2002. She was followed between 2002 in 2010 with CT scans. In November 2010 CT scan showed increased size of nodules in the pelvis. At that time she underwent exploratory laparotomy with resection of tumor nodules near the cecum and left pelvic sidewall.. The tumor was estrogen receptor negative and progesterone receptor positive. Initially she discontinued Premarin but more recently has been taking it.  She alternated two-week courses of Megace and tamoxifen between June and December 2013. At that time CT scan showed progressive disease and the patient was switched to letrozole.   Review of Systems:10 point review of systems is negative as noted above.   Vitals: Blood  pressure 110/62, pulse 68, temperature 98.1 F (36.7 C), temperature source Oral, resp. rate 16, height 5\' 6"  (1.676 m), weight 220 lb (99.791 kg).  Physical Exam: General : The patient is a healthy woman in no acute distress.  HEENT: normocephalic, extraoccular movements normal; neck is supple without thyromegally  Lynphnodes: Supraclavicular and inguinal nodes not enlarged  Abdomen: Soft, non-tender, no ascites, no organomegally, no masses, no hernias  Pelvic:  EGBUS: Normal female  Vagina: Normal, no lesions  Urethra and Bladder: Normal, non-tender  Cervix: Surgically absent  Uterus: Surgically absent  Bi-manual examination: Non-tender; no adenxal masses or nodularity  Rectal: normal sphincter tone, no masses, no blood  Lower extremities: No edema or varicosities. Normal range of motion    Assessment/Plan: Progressive granulosa cell tumor with several peritoneal implants having progressed over the past 6 months. The patient will continue letters all and we will repeat a CT scan in May to assess her disease status.  Other alternative therapies might include mifepristone or cytotoxic chemotherapy such as Taxotere.   Allergies  Allergen Reactions  . Ciprofloxacin Other (See Comments)    FATIGUE  . Codone (Hydrocodone Bitartrate) Itching  . Stadol (Butorphanol Tartrate) Other (See Comments)    ALTERED MENTAL STATUS  . Sulfa Antibiotics Rash    Past Medical History  Diagnosis Date  . Obesity   . Granulosa cell tumor of ovary   . GERD (gastroesophageal reflux disease)     Past Surgical History  Procedure Date  . Abdominal hysterectomy     TAH/BSO  . Secondary tumor debulking 2002  . Ventral hernia repair   .  Tumor debulking 2010  . Exploratory laparotomy     for bowel obstruction    Current Outpatient Prescriptions  Medication Sig Dispense Refill  . letrozole (FEMARA) 2.5 MG tablet Take 2.5 mg by mouth daily.      Marland Kitchen acetaminophen (TYLENOL) 325 MG tablet Take 650 mg  by mouth every 6 (six) hours as needed.      . Calcium Carbonate-Vitamin D (CALTRATE 600+D PO) Take by mouth once a week.      . escitalopram (LEXAPRO) 20 MG tablet Take 20 mg by mouth daily.      Marland Kitchen esomeprazole (NEXIUM) 40 MG capsule Take 40 mg by mouth daily before breakfast.      . estrogens, conjugated, (PREMARIN) 0.625 MG tablet Take 0.625 mg by mouth daily. Take daily for 21 days then do not take for 7 days.      . Multiple Vitamins-Minerals (MULTIVITAMIN PO) Take by mouth daily.        History   Social History  . Marital Status: Married    Spouse Name: N/A    Number of Children: N/A  . Years of Education: N/A   Occupational History  . Not on file.   Social History Main Topics  . Smoking status: Never Smoker   . Smokeless tobacco: Not on file  . Alcohol Use: Yes     Comment: One 8oz glass  . Drug Use: No  . Sexually Active: Not on file   Other Topics Concern  . Not on file   Social History Narrative  . No narrative on file    Family History  Problem Relation Age of Onset  . Stroke Father       Jeannette Corpus, MD 05/22/2012, 8:20 AM

## 2012-06-06 ENCOUNTER — Other Ambulatory Visit: Payer: Self-pay

## 2012-09-02 ENCOUNTER — Other Ambulatory Visit: Payer: Self-pay | Admitting: *Deleted

## 2012-09-02 DIAGNOSIS — C569 Malignant neoplasm of unspecified ovary: Secondary | ICD-10-CM

## 2012-09-17 ENCOUNTER — Ambulatory Visit (HOSPITAL_COMMUNITY)
Admission: RE | Admit: 2012-09-17 | Discharge: 2012-09-17 | Disposition: A | Discharge status: Home / Self Care | Payer: 59 | Source: Ambulatory Visit | Attending: Gynecology | Admitting: Gynecology

## 2012-09-17 ENCOUNTER — Encounter (HOSPITAL_COMMUNITY): Payer: Self-pay

## 2012-09-17 DIAGNOSIS — M479 Spondylosis, unspecified: Secondary | ICD-10-CM | POA: Insufficient documentation

## 2012-09-17 DIAGNOSIS — C569 Malignant neoplasm of unspecified ovary: Secondary | ICD-10-CM

## 2012-09-17 DIAGNOSIS — K432 Incisional hernia without obstruction or gangrene: Secondary | ICD-10-CM | POA: Insufficient documentation

## 2012-09-17 DIAGNOSIS — R911 Solitary pulmonary nodule: Secondary | ICD-10-CM | POA: Insufficient documentation

## 2012-09-17 DIAGNOSIS — C786 Secondary malignant neoplasm of retroperitoneum and peritoneum: Secondary | ICD-10-CM | POA: Insufficient documentation

## 2012-09-17 DIAGNOSIS — E279 Disorder of adrenal gland, unspecified: Secondary | ICD-10-CM | POA: Insufficient documentation

## 2012-09-17 DIAGNOSIS — Z90710 Acquired absence of both cervix and uterus: Secondary | ICD-10-CM | POA: Insufficient documentation

## 2012-09-17 DIAGNOSIS — K7689 Other specified diseases of liver: Secondary | ICD-10-CM | POA: Insufficient documentation

## 2012-09-17 MED ORDER — IOHEXOL 300 MG/ML  SOLN
100.0000 mL | Freq: Once | INTRAMUSCULAR | Status: AC | PRN
  Administered 2012-09-17: 100 mL via INTRAVENOUS

## 2012-09-25 ENCOUNTER — Ambulatory Visit: Payer: 59 | Attending: Gynecology | Admitting: Gynecology

## 2012-09-25 ENCOUNTER — Encounter: Payer: Self-pay | Admitting: Gynecology

## 2012-09-25 VITALS — BP 118/82 | HR 58 | Temp 98.1°F | Resp 18 | Ht 66.0 in | Wt 214.6 lb

## 2012-09-25 DIAGNOSIS — Z90710 Acquired absence of both cervix and uterus: Secondary | ICD-10-CM | POA: Insufficient documentation

## 2012-09-25 DIAGNOSIS — Z171 Estrogen receptor negative status [ER-]: Secondary | ICD-10-CM | POA: Insufficient documentation

## 2012-09-25 DIAGNOSIS — R1909 Other intra-abdominal and pelvic swelling, mass and lump: Secondary | ICD-10-CM | POA: Insufficient documentation

## 2012-09-25 DIAGNOSIS — Z9079 Acquired absence of other genital organ(s): Secondary | ICD-10-CM | POA: Insufficient documentation

## 2012-09-25 DIAGNOSIS — Z79899 Other long term (current) drug therapy: Secondary | ICD-10-CM | POA: Insufficient documentation

## 2012-09-25 DIAGNOSIS — K219 Gastro-esophageal reflux disease without esophagitis: Secondary | ICD-10-CM | POA: Insufficient documentation

## 2012-09-25 DIAGNOSIS — C569 Malignant neoplasm of unspecified ovary: Secondary | ICD-10-CM | POA: Insufficient documentation

## 2012-09-25 DIAGNOSIS — E669 Obesity, unspecified: Secondary | ICD-10-CM | POA: Insufficient documentation

## 2012-09-25 NOTE — Patient Instructions (Signed)
Continue letrozole.  We'll plan on seeing you again in 6 months.

## 2012-09-25 NOTE — Progress Notes (Signed)
Consult Note: Gyn-Onc   [name] 57 y.o. female  Chief Complaint  Patient presents with  . 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 Recent CT scan shows minimal change in the peritoneal nodules. Otherwise the patient's doing well and is asymptomatic  Plan we discussed management options with the patient. We've agreed to continue using letrozole daily. She returned to see me in 6 months.  Interval History: The patient returns today as previously scheduled for followup. She had a CT scan on  09/17/2012. There are no new peritoneal nodules. Several the nodules are slightly larger.  She seems be tolerating Letrozole well  She denies any significant abdominal pain although after eating and feels "full". She has no other GI or GU symptoms. Her functional status is excellent.  HPI:The patient has a long-standing history of a granulosa cell tumor of the ovary dating back to 35. She had a recurrence in 2002 and the upper abdomen which was resected and she was subsequently treated with 6 cycles of intraperitoneal cisplatin and etoposide completed in June of 2002. She was followed between 2002 in 2010 with CT scans. In November 2010 CT scan showed increased size of nodules in the pelvis. At that time she underwent exploratory laparotomy with resection of tumor nodules near the cecum and left pelvic sidewall.. The tumor was estrogen receptor negative and progesterone receptor positive. Initially she discontinued Premarin but more recently has been taking it.  She alternated two-week courses of Megace and tamoxifen between June and December 2013. At that time CT scan showed progressive disease and the patient was switched to letrozole.   Review of Systems:10 point review of systems is negative as noted above.   Vitals: Blood pressure 118/82, pulse 58, temperature 98.1 F (36.7 C), resp. rate 18, height 5\' 6"  (1.676 m), weight 214 lb 9.6 oz (97.342  kg).  Physical Exam: General : The patient is a healthy woman in no acute distress.  HEENT: normocephalic, extraoccular movements normal; neck is supple without thyromegally  Lynphnodes: Supraclavicular and inguinal nodes not enlarged  Abdomen: Soft, non-tender, no ascites, no organomegally, no masses, no hernias  Pelvic:  EGBUS: Normal female  Vagina: Normal, no lesions  Urethra and Bladder: Normal, non-tender  Cervix: Surgically absent  Uterus: Surgically absent  Bi-manual examination: Non-tender; no adenxal masses or nodularity  Rectal: normal sphincter tone, no masses, no blood  Lower extremities: No edema or varicosities. Normal range of motion    Assessment/Plan: Progressive granulosa cell tumor with several peritoneal implants having progressed over the past 6 months. The patient will continue letters all and we will repeat a CT scan in May to assess her disease status.  Other alternative therapies might include mifepristone or cytotoxic chemotherapy such as Taxotere.   Allergies  Allergen Reactions  . Ciprofloxacin Other (See Comments)    FATIGUE  . Codone (Hydrocodone Bitartrate) Itching  . Stadol (Butorphanol Tartrate) Other (See Comments)    ALTERED MENTAL STATUS  . Sulfa Antibiotics Rash    Past Medical History  Diagnosis Date  . Obesity   . Granulosa cell tumor of ovary   . GERD (gastroesophageal reflux disease)   . Ovarian cancer     Past Surgical History  Procedure Laterality Date  . Abdominal hysterectomy      TAH/BSO  . Secondary tumor debulking  2002  . Ventral hernia repair    . Tumor debulking  2010  . Exploratory laparotomy  for bowel obstruction    Current Outpatient Prescriptions  Medication Sig Dispense Refill  . acetaminophen (TYLENOL) 325 MG tablet Take 650 mg by mouth every 6 (six) hours as needed.      . Calcium Carbonate-Vitamin D (CALTRATE 600+D PO) Take by mouth once a week.      . escitalopram (LEXAPRO) 20 MG tablet Take 20  mg by mouth daily.      Marland Kitchen esomeprazole (NEXIUM) 40 MG capsule Take 40 mg by mouth daily before breakfast.      . letrozole (FEMARA) 2.5 MG tablet Take 2.5 mg by mouth daily.      . Multiple Vitamins-Minerals (MULTIVITAMIN PO) Take by mouth daily.      . Probiotic Product (ALIGN PO) Take by mouth daily.       No current facility-administered medications for this visit.    History   Social History  . Marital Status: Married    Spouse Name: N/A    Number of Children: N/A  . Years of Education: N/A   Occupational History  . Not on file.   Social History Main Topics  . Smoking status: Never Smoker   . Smokeless tobacco: Not on file  . Alcohol Use: Yes     Comment: One 8oz glass  . Drug Use: No  . Sexually Active: Not on file   Other Topics Concern  . Not on file   Social History Narrative  . No narrative on file    Family History  Problem Relation Age of Onset  . Stroke Father       Jeannette Corpus, MD 09/25/2012, 8:25 AM

## 2012-10-16 ENCOUNTER — Ambulatory Visit: Payer: 59 | Admitting: Gynecology

## 2013-02-19 ENCOUNTER — Other Ambulatory Visit: Payer: Self-pay | Admitting: Gynecologic Oncology

## 2013-02-19 DIAGNOSIS — C569 Malignant neoplasm of unspecified ovary: Secondary | ICD-10-CM

## 2013-02-25 ENCOUNTER — Other Ambulatory Visit: Payer: Self-pay

## 2013-03-22 ENCOUNTER — Encounter (HOSPITAL_COMMUNITY): Payer: Self-pay

## 2013-03-22 ENCOUNTER — Ambulatory Visit (HOSPITAL_COMMUNITY)
Admission: RE | Admit: 2013-03-22 | Discharge: 2013-03-22 | Disposition: A | Discharge status: Home / Self Care | Payer: 59 | Source: Ambulatory Visit | Attending: Gynecologic Oncology | Admitting: Gynecologic Oncology

## 2013-03-22 DIAGNOSIS — R918 Other nonspecific abnormal finding of lung field: Secondary | ICD-10-CM | POA: Insufficient documentation

## 2013-03-22 DIAGNOSIS — C569 Malignant neoplasm of unspecified ovary: Secondary | ICD-10-CM | POA: Insufficient documentation

## 2013-03-22 DIAGNOSIS — Z9221 Personal history of antineoplastic chemotherapy: Secondary | ICD-10-CM | POA: Insufficient documentation

## 2013-03-22 DIAGNOSIS — Z90710 Acquired absence of both cervix and uterus: Secondary | ICD-10-CM | POA: Insufficient documentation

## 2013-03-22 DIAGNOSIS — D3500 Benign neoplasm of unspecified adrenal gland: Secondary | ICD-10-CM | POA: Insufficient documentation

## 2013-03-22 DIAGNOSIS — K7689 Other specified diseases of liver: Secondary | ICD-10-CM | POA: Insufficient documentation

## 2013-03-22 DIAGNOSIS — N281 Cyst of kidney, acquired: Secondary | ICD-10-CM | POA: Insufficient documentation

## 2013-03-22 DIAGNOSIS — K439 Ventral hernia without obstruction or gangrene: Secondary | ICD-10-CM | POA: Insufficient documentation

## 2013-03-22 MED ORDER — IOHEXOL 300 MG/ML  SOLN
100.0000 mL | Freq: Once | INTRAMUSCULAR | Status: AC | PRN
  Administered 2013-03-22: 100 mL via INTRAVENOUS

## 2013-04-09 ENCOUNTER — Ambulatory Visit: Payer: 59 | Admitting: Gynecology

## 2013-04-30 ENCOUNTER — Encounter: Payer: Self-pay | Admitting: Gynecology

## 2013-04-30 ENCOUNTER — Ambulatory Visit: Payer: 59 | Attending: Gynecology | Admitting: Gynecology

## 2013-04-30 VITALS — BP 125/55 | HR 53 | Temp 97.5°F | Resp 18 | Ht 66.0 in | Wt 213.2 lb

## 2013-04-30 DIAGNOSIS — Z9221 Personal history of antineoplastic chemotherapy: Secondary | ICD-10-CM | POA: Insufficient documentation

## 2013-04-30 DIAGNOSIS — K219 Gastro-esophageal reflux disease without esophagitis: Secondary | ICD-10-CM | POA: Insufficient documentation

## 2013-04-30 DIAGNOSIS — C569 Malignant neoplasm of unspecified ovary: Secondary | ICD-10-CM | POA: Insufficient documentation

## 2013-04-30 DIAGNOSIS — Z9079 Acquired absence of other genital organ(s): Secondary | ICD-10-CM | POA: Insufficient documentation

## 2013-04-30 DIAGNOSIS — Z79899 Other long term (current) drug therapy: Secondary | ICD-10-CM | POA: Insufficient documentation

## 2013-04-30 DIAGNOSIS — Z90710 Acquired absence of both cervix and uterus: Secondary | ICD-10-CM | POA: Insufficient documentation

## 2013-04-30 DIAGNOSIS — E669 Obesity, unspecified: Secondary | ICD-10-CM | POA: Insufficient documentation

## 2013-04-30 DIAGNOSIS — K669 Disorder of peritoneum, unspecified: Secondary | ICD-10-CM | POA: Insufficient documentation

## 2013-04-30 NOTE — Patient Instructions (Signed)
Return to see us in 6 months. 

## 2013-04-30 NOTE — Progress Notes (Signed)
Consult Note: Gyn-Onc   [name] 58 y.o. female  Chief Complaint  Patient presents with  . 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 Recent CT scan shows minimal decrease in the size of  the peritoneal nodules. Otherwise the patient's doing well and is asymptomatic  Plan: We will continue using letrozole daily. She returned to see me in 6 months.  Interval History: The patient returns today as previously scheduled for followup. She had a CT scan on  03/22/2013 that showed a slight decrease in the size of the pelvic peritoneal nodules.. There are no new peritoneal nodules.   She seems be tolerating Letrozole well except for some insomnia  She denies any significant abdominal pain and has no other GI or GU symptoms. Her functional status is excellent.  HPI:The patient has a long-standing history of a granulosa cell tumor of the ovary dating back to 42. She had a recurrence in 2002 and the upper abdomen which was resected and she was subsequently treated with 6 cycles of intraperitoneal cisplatin and etoposide completed in June of 2002. She was followed between 2002 in 2010 with CT scans. In November 2010 CT scan showed increased size of nodules in the pelvis. At that time she underwent exploratory laparotomy with resection of tumor nodules near the cecum and left pelvic sidewall.. The tumor was estrogen receptor negative and progesterone receptor positive. Initially she discontinued Premarin but more recently has been taking it.  She alternated two-week courses of Megace and tamoxifen between June and December 2013. At that time CT scan showed progressive disease and the patient was switched to letrozole.   Review of Systems:10 point review of systems is negative as noted above.   Vitals: Blood pressure 125/55, pulse 53, temperature 97.5 F (36.4 C), resp. rate 18, height 5\' 6"  (1.676 m), weight 213 lb 3.2 oz (96.707 kg).  Physical  Exam: General : The patient is a healthy woman in no acute distress.  HEENT: normocephalic, extraoccular movements normal; neck is supple without thyromegally  Lynphnodes: Supraclavicular and inguinal nodes not enlarged  Abdomen: Soft, non-tender, no ascites, no organomegally, no masses, no hernias  Pelvic:  EGBUS: Normal female  Vagina: Normal, no lesions  Urethra and Bladder: Normal, non-tender  Cervix: Surgically absent  Uterus: Surgically absent  Bi-manual examination: Non-tender; no adenxal masses or nodularity  Rectal: normal sphincter tone, no masses, no blood  Lower extremities: No edema or varicosities. Normal range of motion       Allergies  Allergen Reactions  . Ciprofloxacin Other (See Comments)    FATIGUE  . Codone [Hydrocodone Bitartrate] Itching  . Stadol [Butorphanol Tartrate] Other (See Comments)    ALTERED MENTAL STATUS  . Sulfa Antibiotics Rash    Past Medical History  Diagnosis Date  . Obesity   . Granulosa cell tumor of ovary   . GERD (gastroesophageal reflux disease)   . Ovarian cancer     Past Surgical History  Procedure Laterality Date  . Abdominal hysterectomy      TAH/BSO  . Secondary tumor debulking  2002  . Ventral hernia repair    . Tumor debulking  2010  . Exploratory laparotomy      for bowel obstruction    Current Outpatient Prescriptions  Medication Sig Dispense Refill  . acetaminophen (TYLENOL) 325 MG tablet Take 650 mg by mouth every 6 (six) hours as needed.      . Calcium Carbonate-Vitamin D (  CALTRATE 600+D PO) Take by mouth once a week.      . escitalopram (LEXAPRO) 20 MG tablet Take 20 mg by mouth daily.      Marland Kitchen esomeprazole (NEXIUM) 40 MG capsule Take 40 mg by mouth daily before breakfast.      . letrozole (FEMARA) 2.5 MG tablet Take 2.5 mg by mouth daily.      . Multiple Vitamins-Minerals (MULTIVITAMIN PO) Take by mouth daily.      . Probiotic Product (ALIGN PO) Take by mouth daily.       No current facility-administered  medications for this visit.    History   Social History  . Marital Status: Married    Spouse Name: N/A    Number of Children: N/A  . Years of Education: N/A   Occupational History  . Not on file.   Social History Main Topics  . Smoking status: Never Smoker   . Smokeless tobacco: Not on file  . Alcohol Use: Yes     Comment: One 8oz glass  . Drug Use: No  . Sexual Activity: Not on file   Other Topics Concern  . Not on file   Social History Narrative  . No narrative on file    Family History  Problem Relation Age of Onset  . Stroke Father       Alvino Chapel, MD 04/30/2013, 12:27 PM

## 2013-08-06 ENCOUNTER — Other Ambulatory Visit: Payer: Self-pay | Admitting: Gynecologic Oncology

## 2013-08-06 DIAGNOSIS — C569 Malignant neoplasm of unspecified ovary: Secondary | ICD-10-CM

## 2013-08-06 MED ORDER — LETROZOLE 2.5 MG PO TABS: 2.5000 mg | ORAL_TABLET | Freq: Every day | ORAL | Status: DC

## 2013-09-06 ENCOUNTER — Telehealth: Payer: Self-pay | Admitting: *Deleted

## 2013-09-06 NOTE — Telephone Encounter (Signed)
Requested f/u with MD. Last seen jan 2015, f/u q 42months. New appt for pt made July 31

## 2013-09-23 ENCOUNTER — Other Ambulatory Visit: Payer: Self-pay | Admitting: *Deleted

## 2013-09-23 DIAGNOSIS — C569 Malignant neoplasm of unspecified ovary: Secondary | ICD-10-CM

## 2013-09-24 ENCOUNTER — Ambulatory Visit: Payer: 59

## 2013-09-24 DIAGNOSIS — C569 Malignant neoplasm of unspecified ovary: Secondary | ICD-10-CM

## 2013-09-24 LAB — COMPREHENSIVE METABOLIC PANEL (CC13)
ALBUMIN: 3.9 g/dL (ref 3.5–5.0)
ALT: 27 U/L (ref 0–55)
ANION GAP: 13 meq/L — AB (ref 3–11)
AST: 23 U/L (ref 5–34)
Alkaline Phosphatase: 81 U/L (ref 40–150)
BUN: 21.2 mg/dL (ref 7.0–26.0)
CALCIUM: 9.5 mg/dL (ref 8.4–10.4)
CO2: 24 meq/L (ref 22–29)
CREATININE: 1.1 mg/dL (ref 0.6–1.1)
Chloride: 105 mEq/L (ref 98–109)
Glucose: 90 mg/dl (ref 70–140)
POTASSIUM: 4.5 meq/L (ref 3.5–5.1)
Sodium: 141 mEq/L (ref 136–145)
Total Bilirubin: 0.53 mg/dL (ref 0.20–1.20)
Total Protein: 6.8 g/dL (ref 6.4–8.3)

## 2013-09-28 ENCOUNTER — Other Ambulatory Visit: Payer: Self-pay | Admitting: Occupational Medicine

## 2013-09-28 ENCOUNTER — Ambulatory Visit: Payer: PRIVATE HEALTH INSURANCE

## 2013-09-28 DIAGNOSIS — M549 Dorsalgia, unspecified: Secondary | ICD-10-CM

## 2013-09-30 ENCOUNTER — Encounter (HOSPITAL_COMMUNITY): Payer: Self-pay

## 2013-09-30 ENCOUNTER — Ambulatory Visit (HOSPITAL_COMMUNITY): Payer: 59

## 2013-09-30 ENCOUNTER — Ambulatory Visit (HOSPITAL_COMMUNITY)
Admission: RE | Admit: 2013-09-30 | Discharge: 2013-09-30 | Disposition: A | Discharge status: Home / Self Care | Payer: 59 | Source: Ambulatory Visit | Attending: Gynecology | Admitting: Gynecology

## 2013-09-30 DIAGNOSIS — N281 Cyst of kidney, acquired: Secondary | ICD-10-CM | POA: Insufficient documentation

## 2013-09-30 DIAGNOSIS — K439 Ventral hernia without obstruction or gangrene: Secondary | ICD-10-CM | POA: Insufficient documentation

## 2013-09-30 DIAGNOSIS — C569 Malignant neoplasm of unspecified ovary: Secondary | ICD-10-CM | POA: Insufficient documentation

## 2013-09-30 DIAGNOSIS — R918 Other nonspecific abnormal finding of lung field: Secondary | ICD-10-CM | POA: Insufficient documentation

## 2013-09-30 DIAGNOSIS — Z9221 Personal history of antineoplastic chemotherapy: Secondary | ICD-10-CM | POA: Insufficient documentation

## 2013-09-30 DIAGNOSIS — R109 Unspecified abdominal pain: Secondary | ICD-10-CM | POA: Insufficient documentation

## 2013-09-30 DIAGNOSIS — K5730 Diverticulosis of large intestine without perforation or abscess without bleeding: Secondary | ICD-10-CM | POA: Insufficient documentation

## 2013-09-30 DIAGNOSIS — E278 Other specified disorders of adrenal gland: Secondary | ICD-10-CM | POA: Insufficient documentation

## 2013-09-30 DIAGNOSIS — K7689 Other specified diseases of liver: Secondary | ICD-10-CM | POA: Insufficient documentation

## 2013-09-30 DIAGNOSIS — N949 Unspecified condition associated with female genital organs and menstrual cycle: Secondary | ICD-10-CM | POA: Insufficient documentation

## 2013-09-30 DIAGNOSIS — R1901 Right upper quadrant abdominal swelling, mass and lump: Secondary | ICD-10-CM | POA: Insufficient documentation

## 2013-09-30 MED ORDER — IOHEXOL 300 MG/ML  SOLN
100.0000 mL | Freq: Once | INTRAMUSCULAR | Status: AC | PRN
  Administered 2013-09-30: 100 mL via INTRAVENOUS

## 2013-10-01 ENCOUNTER — Ambulatory Visit: Payer: 59 | Admitting: Gynecology

## 2013-10-04 ENCOUNTER — Telehealth: Payer: Self-pay | Admitting: *Deleted

## 2013-10-04 NOTE — Telephone Encounter (Signed)
Message copied by Lucile Crater on Mon Oct 04, 2013 10:36 AM ------      Message from: [name]      Created: Fri Oct 01, 2013  1:53 PM       Please let her know that there is some improvement on CT scan. Continue our current treatment.  Repeat CT in 6 months      ----- Message -----         From: Rad Results In Interface         Sent: 09/30/2013   6:49 PM           To: Alvino Chapel, MD                   ------

## 2013-10-04 NOTE — Telephone Encounter (Signed)
LMOVM for pt with MDs instructions.

## 2013-10-06 ENCOUNTER — Encounter: Payer: Self-pay | Admitting: Gynecology

## 2013-10-06 ENCOUNTER — Ambulatory Visit: Payer: 59 | Attending: Gynecology | Admitting: Gynecology

## 2013-10-06 VITALS — BP 139/64 | HR 54 | Temp 97.6°F | Resp 18 | Ht 66.0 in | Wt 211.5 lb

## 2013-10-06 DIAGNOSIS — Z79899 Other long term (current) drug therapy: Secondary | ICD-10-CM | POA: Insufficient documentation

## 2013-10-06 DIAGNOSIS — K219 Gastro-esophageal reflux disease without esophagitis: Secondary | ICD-10-CM | POA: Insufficient documentation

## 2013-10-06 DIAGNOSIS — Z79811 Long term (current) use of aromatase inhibitors: Secondary | ICD-10-CM

## 2013-10-06 DIAGNOSIS — C569 Malignant neoplasm of unspecified ovary: Secondary | ICD-10-CM | POA: Insufficient documentation

## 2013-10-06 DIAGNOSIS — Z90710 Acquired absence of both cervix and uterus: Secondary | ICD-10-CM | POA: Insufficient documentation

## 2013-10-06 NOTE — Progress Notes (Signed)
Consult Note: Gyn-Onc   [name] 58 y.o. female  Chief Complaint  Patient presents with  . 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 Recent CT scan shows minimal decrease in the size of  the pelvic peritoneal nodules. Otherwise the patient's doing well and is asymptomatic  Plan: We will continue using letrozole daily. She returned to see me in 6 months and we'll arrange a CT scan just prior to that visit..  Interval History: The patient returns today as previously scheduled for followup. She had a CT scan on  09/30/2013 that showed a slight decrease in the size of the pelvic peritoneal nodules the target lesion now measures 8 x 11 mm (previously 11 x 16 mm).Marland Kitchen other nodules are stable There are no new peritoneal nodules.   She seems be tolerating Letrozole well except for some insomnia  She denies any significant abdominal pain and has no other GI or GU symptoms. Her functional status is excellent.  HPI:The patient has a long-standing history of a granulosa cell tumor of the ovary dating back to 5. She had a recurrence in 2002 and the upper abdomen which was resected and she was subsequently treated with 6 cycles of intraperitoneal cisplatin and etoposide completed in June of 2002. She was followed between 2002 in 2010 with CT scans. In November 2010 CT scan showed increased size of nodules in the pelvis. At that time she underwent exploratory laparotomy with resection of tumor nodules near the cecum and left pelvic sidewall.. The tumor was estrogen receptor negative and progesterone receptor positive. She alternated two-week courses of Megace and tamoxifen between June and December 2013. At that time CT scan showed progressive disease and the patient was switched to letrozole.   Review of Systems:10 point review of systems is negative as noted above.   Vitals: Blood pressure 139/64, pulse 54, temperature 97.6 F (36.4 C), resp. rate 18,  height 5\' 6"  (1.676 m), weight 211 lb 8 oz (95.936 kg).  Physical Exam: General : The patient is a healthy woman in no acute distress.  HEENT: normocephalic, extraoccular movements normal; neck is supple without thyromegally  Lynphnodes: Supraclavicular and inguinal nodes not enlarged  Abdomen: Soft, non-tender, no ascites, no organomegally, no masses, no hernias  Pelvic:  EGBUS: Normal female  Vagina: Normal, no lesions  Urethra and Bladder: Normal, non-tender  Cervix: Surgically absent  Uterus: Surgically absent  Bi-manual examination: Non-tender; no adenxal masses or nodularity  Rectal: normal sphincter tone, no masses, no blood  Lower extremities: No edema or varicosities. Normal range of motion       Allergies  Allergen Reactions  . Ciprofloxacin Other (See Comments)    FATIGUE  . Codone [Hydrocodone Bitartrate] Itching  . Stadol [Butorphanol Tartrate] Other (See Comments)    ALTERED MENTAL STATUS  . Sulfa Antibiotics Rash    Past Medical History  Diagnosis Date  . Obesity   . Granulosa cell tumor of ovary   . GERD (gastroesophageal reflux disease)   . Ovarian cancer     Past Surgical History  Procedure Laterality Date  . Abdominal hysterectomy      TAH/BSO  . Secondary tumor debulking  2002  . Ventral hernia repair    . Tumor debulking  2010  . Exploratory laparotomy      for bowel obstruction    Current Outpatient Prescriptions  Medication Sig Dispense Refill  . acetaminophen (TYLENOL) 325 MG tablet Take 650 mg by  mouth every 6 (six) hours as needed.      . Calcium Carbonate-Vitamin D (CALTRATE 600+D PO) Take by mouth once a week.      . escitalopram (LEXAPRO) 20 MG tablet Take 20 mg by mouth daily.      Marland Kitchen esomeprazole (NEXIUM) 40 MG capsule Take 40 mg by mouth daily before breakfast.      . letrozole (FEMARA) 2.5 MG tablet Take 1 tablet (2.5 mg total) by mouth daily.  90 tablet  4  . Multiple Vitamins-Minerals (MULTIVITAMIN PO) Take by mouth daily.       . Probiotic Product (ALIGN PO) Take by mouth daily.       No current facility-administered medications for this visit.    History   Social History  . Marital Status: Married    Spouse Name: N/A    Number of Children: N/A  . Years of Education: N/A   Occupational History  . Not on file.   Social History Main Topics  . Smoking status: Never Smoker   . Smokeless tobacco: Not on file  . Alcohol Use: Yes     Comment: One 8oz glass  . Drug Use: No  . Sexual Activity: No   Other Topics Concern  . Not on file   Social History Narrative  . No narrative on file    Family History  Problem Relation Age of Onset  . Stroke Father       Alvino Chapel, MD 10/06/2013, 11:19 AM

## 2013-10-19 ENCOUNTER — Other Ambulatory Visit: Payer: Self-pay | Admitting: *Deleted

## 2013-10-19 ENCOUNTER — Ambulatory Visit: Payer: PRIVATE HEALTH INSURANCE | Attending: Family Medicine

## 2013-10-19 DIAGNOSIS — IMO0001 Reserved for inherently not codable concepts without codable children: Secondary | ICD-10-CM | POA: Diagnosis not present

## 2013-10-19 DIAGNOSIS — M545 Low back pain, unspecified: Secondary | ICD-10-CM | POA: Insufficient documentation

## 2013-10-20 ENCOUNTER — Other Ambulatory Visit: Payer: 59

## 2013-10-26 ENCOUNTER — Ambulatory Visit: Payer: PRIVATE HEALTH INSURANCE | Attending: Family Medicine

## 2013-10-26 DIAGNOSIS — M545 Low back pain, unspecified: Secondary | ICD-10-CM | POA: Diagnosis not present

## 2013-10-26 DIAGNOSIS — IMO0001 Reserved for inherently not codable concepts without codable children: Secondary | ICD-10-CM | POA: Diagnosis not present

## 2013-10-27 ENCOUNTER — Ambulatory Visit: Payer: PRIVATE HEALTH INSURANCE

## 2013-11-04 ENCOUNTER — Ambulatory Visit: Payer: PRIVATE HEALTH INSURANCE

## 2013-11-04 DIAGNOSIS — IMO0001 Reserved for inherently not codable concepts without codable children: Secondary | ICD-10-CM | POA: Diagnosis not present

## 2013-11-05 ENCOUNTER — Ambulatory Visit: Payer: 59 | Admitting: Gynecology

## 2013-11-16 ENCOUNTER — Ambulatory Visit: Payer: PRIVATE HEALTH INSURANCE | Admitting: Rehabilitation

## 2013-11-16 DIAGNOSIS — IMO0001 Reserved for inherently not codable concepts without codable children: Secondary | ICD-10-CM | POA: Diagnosis not present

## 2013-11-18 ENCOUNTER — Ambulatory Visit: Payer: PRIVATE HEALTH INSURANCE | Admitting: Rehabilitation

## 2013-11-19 ENCOUNTER — Ambulatory Visit: Payer: 59 | Admitting: Gynecology

## 2013-11-29 ENCOUNTER — Ambulatory Visit
Admission: RE | Admit: 2013-11-29 | Discharge: 2013-11-29 | Disposition: A | Discharge status: Home / Self Care | Payer: 59 | Source: Ambulatory Visit | Attending: Internal Medicine | Admitting: Internal Medicine

## 2013-11-29 ENCOUNTER — Other Ambulatory Visit: Payer: Self-pay | Admitting: Internal Medicine

## 2013-11-29 DIAGNOSIS — M25552 Pain in left hip: Secondary | ICD-10-CM

## 2013-12-03 ENCOUNTER — Other Ambulatory Visit: Payer: Self-pay | Admitting: Gynecologic Oncology

## 2013-12-03 ENCOUNTER — Ambulatory Visit: Payer: 59 | Admitting: Gynecology

## 2013-12-03 DIAGNOSIS — C569 Malignant neoplasm of unspecified ovary: Secondary | ICD-10-CM

## 2013-12-03 DIAGNOSIS — Z9189 Other specified personal risk factors, not elsewhere classified: Secondary | ICD-10-CM

## 2013-12-03 NOTE — Progress Notes (Signed)
Patient requesting bone density due to long term use of letrozole and risk for osteopenia/osteoporosis.  Dr. Fermin Schwab agreeable to the plan.  Order placed.

## 2014-02-23 ENCOUNTER — Ambulatory Visit
Admission: RE | Admit: 2014-02-23 | Discharge: 2014-02-23 | Disposition: A | Discharge status: Home / Self Care | Payer: 59 | Source: Ambulatory Visit | Attending: Nurse Practitioner | Admitting: Nurse Practitioner

## 2014-02-23 ENCOUNTER — Other Ambulatory Visit: Payer: Self-pay | Admitting: Nurse Practitioner

## 2014-02-23 DIAGNOSIS — R059 Cough, unspecified: Secondary | ICD-10-CM

## 2014-02-23 DIAGNOSIS — R05 Cough: Secondary | ICD-10-CM

## 2014-02-23 DIAGNOSIS — R7611 Nonspecific reaction to tuberculin skin test without active tuberculosis: Secondary | ICD-10-CM

## 2014-03-14 ENCOUNTER — Ambulatory Visit (HOSPITAL_COMMUNITY)
Admission: RE | Admit: 2014-03-14 | Discharge: 2014-03-14 | Disposition: A | Discharge status: Home / Self Care | Payer: 59 | Source: Ambulatory Visit | Attending: Gynecologic Oncology | Admitting: Gynecologic Oncology

## 2014-03-14 DIAGNOSIS — Z9189 Other specified personal risk factors, not elsewhere classified: Secondary | ICD-10-CM

## 2014-03-14 DIAGNOSIS — Z780 Asymptomatic menopausal state: Secondary | ICD-10-CM | POA: Insufficient documentation

## 2014-03-14 DIAGNOSIS — Z13820 Encounter for screening for osteoporosis: Secondary | ICD-10-CM | POA: Diagnosis not present

## 2014-03-14 DIAGNOSIS — C569 Malignant neoplasm of unspecified ovary: Secondary | ICD-10-CM

## 2014-04-01 ENCOUNTER — Other Ambulatory Visit: Payer: Self-pay | Admitting: Gynecologic Oncology

## 2014-04-01 DIAGNOSIS — C569 Malignant neoplasm of unspecified ovary: Secondary | ICD-10-CM

## 2014-04-07 ENCOUNTER — Ambulatory Visit (HOSPITAL_COMMUNITY)
Admission: RE | Admit: 2014-04-07 | Discharge: 2014-04-07 | Disposition: A | Discharge status: Home / Self Care | Payer: 59 | Source: Ambulatory Visit | Attending: Gynecologic Oncology | Admitting: Gynecologic Oncology

## 2014-04-07 ENCOUNTER — Encounter (HOSPITAL_COMMUNITY): Payer: Self-pay

## 2014-04-07 DIAGNOSIS — D3910 Neoplasm of uncertain behavior of unspecified ovary: Secondary | ICD-10-CM | POA: Insufficient documentation

## 2014-04-07 DIAGNOSIS — N281 Cyst of kidney, acquired: Secondary | ICD-10-CM | POA: Diagnosis not present

## 2014-04-07 DIAGNOSIS — C569 Malignant neoplasm of unspecified ovary: Secondary | ICD-10-CM

## 2014-04-07 DIAGNOSIS — Z90710 Acquired absence of both cervix and uterus: Secondary | ICD-10-CM | POA: Insufficient documentation

## 2014-04-07 DIAGNOSIS — K760 Fatty (change of) liver, not elsewhere classified: Secondary | ICD-10-CM | POA: Diagnosis not present

## 2014-04-07 DIAGNOSIS — I517 Cardiomegaly: Secondary | ICD-10-CM | POA: Insufficient documentation

## 2014-04-07 DIAGNOSIS — K219 Gastro-esophageal reflux disease without esophagitis: Secondary | ICD-10-CM | POA: Diagnosis not present

## 2014-04-07 DIAGNOSIS — M47898 Other spondylosis, sacral and sacrococcygeal region: Secondary | ICD-10-CM | POA: Insufficient documentation

## 2014-04-07 DIAGNOSIS — E669 Obesity, unspecified: Secondary | ICD-10-CM | POA: Insufficient documentation

## 2014-04-07 DIAGNOSIS — R918 Other nonspecific abnormal finding of lung field: Secondary | ICD-10-CM | POA: Insufficient documentation

## 2014-04-07 MED ORDER — IOHEXOL 300 MG/ML  SOLN
100.0000 mL | Freq: Once | INTRAMUSCULAR | Status: AC | PRN
  Administered 2014-04-07: 100 mL via INTRAVENOUS

## 2014-04-13 ENCOUNTER — Ambulatory Visit: Payer: 59 | Attending: Gynecology | Admitting: Gynecology

## 2014-04-13 ENCOUNTER — Encounter: Payer: Self-pay | Admitting: Gynecology

## 2014-04-13 VITALS — BP 139/80 | HR 55 | Temp 98.2°F | Resp 16 | Ht 66.0 in | Wt 215.4 lb

## 2014-04-13 DIAGNOSIS — Z171 Estrogen receptor negative status [ER-]: Secondary | ICD-10-CM | POA: Insufficient documentation

## 2014-04-13 DIAGNOSIS — Z90710 Acquired absence of both cervix and uterus: Secondary | ICD-10-CM | POA: Diagnosis not present

## 2014-04-13 DIAGNOSIS — Z90722 Acquired absence of ovaries, bilateral: Secondary | ICD-10-CM | POA: Insufficient documentation

## 2014-04-13 DIAGNOSIS — Z9079 Acquired absence of other genital organ(s): Secondary | ICD-10-CM | POA: Insufficient documentation

## 2014-04-13 DIAGNOSIS — K219 Gastro-esophageal reflux disease without esophagitis: Secondary | ICD-10-CM | POA: Insufficient documentation

## 2014-04-13 DIAGNOSIS — E669 Obesity, unspecified: Secondary | ICD-10-CM | POA: Diagnosis not present

## 2014-04-13 DIAGNOSIS — C569 Malignant neoplasm of unspecified ovary: Secondary | ICD-10-CM | POA: Diagnosis not present

## 2014-04-13 DIAGNOSIS — Z79899 Other long term (current) drug therapy: Secondary | ICD-10-CM | POA: Insufficient documentation

## 2014-04-13 DIAGNOSIS — R1900 Intra-abdominal and pelvic swelling, mass and lump, unspecified site: Secondary | ICD-10-CM | POA: Insufficient documentation

## 2014-04-13 NOTE — Progress Notes (Signed)
Consult Note: Gyn-Onc   [name] 58 y.o. female  Chief Complaint  Patient presents with  . 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 Recent CT scan shows minimal decrease in the size of  the pelvic and peritoneal nodules. Otherwise the patient's doing well and is asymptomatic. Recent bone density study is normal.   Plan: We will continue using letrozole daily. She returned to see me in 6 months and we'll arrange a CT scan just prior to that visit .We ask the patient to have her cholesterol checked at her next visit with her primary care physician.  The patient return to see [name] in 1 year and we will obtain a CT scan just prior to that visit.  Interval History: The patient returns today as previously scheduled for followup. Since her last visit she's done well and continues taking left resolve. She had a CT scan for reassessment on December 17 showing either stable or slight decrease in size of her peritoneal and pelvic nodules. She denies any significant abdominal pain and has no other GI or GU symptoms. Her functional status is excellent.  HPI:The patient has a long-standing history of a granulosa cell tumor of the ovary dating back to 73. She had a recurrence in 2002 and the upper abdomen which was resected and she was subsequently treated with 6 cycles of intraperitoneal cisplatin and etoposide completed in June of 2002. She was followed between 2002 in 2010 with CT scans. In November 2010 CT scan showed increased size of nodules in the pelvis. At that time she underwent exploratory laparotomy with resection of tumor nodules near the cecum and left pelvic sidewall.. The tumor was estrogen receptor negative and progesterone receptor positive. She alternated two-week courses of Megace and tamoxifen between June and December 2013. At that time CT scan showed progressive disease and the patient was switched to letrozole.   Review of  Systems:10 point review of systems is negative as noted above.   Vitals: Blood pressure 139/80, pulse 55, temperature 98.2 F (36.8 C), temperature source Oral, resp. rate 16, height 5\' 6"  (1.676 m), weight 215 lb 6.4 oz (97.705 kg).  Physical Exam: General : The patient is a healthy woman in no acute distress.  HEENT: normocephalic, extraoccular movements normal; neck is supple without thyromegally  Lynphnodes: Supraclavicular and inguinal nodes not enlarged  Abdomen: Soft, non-tender, no ascites, no organomegally, no masses, no hernias  Pelvic:  EGBUS: Normal female  Vagina: Normal, no lesions  Urethra and Bladder: Normal, non-tender  Cervix: Surgically absent  Uterus: Surgically absent  Bi-manual examination: Non-tender; no adenxal masses or nodularity  Rectal: normal sphincter tone, no masses, no blood  Lower extremities: No edema or varicosities. Normal range of motion       Allergies  Allergen Reactions  . Ciprofloxacin Other (See Comments)    FATIGUE  . Codone [Hydrocodone Bitartrate] Itching  . Stadol [Butorphanol Tartrate] Other (See Comments)    ALTERED MENTAL STATUS  . Sulfa Antibiotics Rash    Past Medical History  Diagnosis Date  . Obesity   . GERD (gastroesophageal reflux disease)   . Granulosa cell tumor of ovary   . Ovarian cancer     Past Surgical History  Procedure Laterality Date  . Abdominal hysterectomy      TAH/BSO  . Secondary tumor debulking  2002  . Ventral hernia repair    . Tumor debulking  2010  . Exploratory laparotomy  for bowel obstruction    Current Outpatient Prescriptions  Medication Sig Dispense Refill  . acetaminophen (TYLENOL) 325 MG tablet Take 650 mg by mouth every 6 (six) hours as needed.    . Calcium Carbonate-Vitamin D (CALTRATE 600+D PO) Take by mouth once a week.    . escitalopram (LEXAPRO) 20 MG tablet Take 20 mg by mouth daily.    Marland Kitchen esomeprazole (NEXIUM) 40 MG capsule Take 40 mg by mouth daily before  breakfast.    . letrozole (FEMARA) 2.5 MG tablet Take 1 tablet (2.5 mg total) by mouth daily. 90 tablet 4  . Multiple Vitamins-Minerals (MULTIVITAMIN PO) Take by mouth daily.    . naproxen sodium (ANAPROX) 550 MG tablet Take 550 mg by mouth 2 (two) times daily with a meal.    . Probiotic Product (ALIGN PO) Take by mouth daily.    . Psyllium (METAMUCIL SMOOTH TEXTURE PO) Take 1 scoop by mouth daily.    Marland Kitchen PROAIR HFA 108 (90 BASE) MCG/ACT inhaler   0   No current facility-administered medications for this visit.    History   Social History  . Marital Status: Married    Spouse Name: N/A    Number of Children: N/A  . Years of Education: N/A   Occupational History  . Not on file.   Social History Main Topics  . Smoking status: Never Smoker   . Smokeless tobacco: Not on file  . Alcohol Use: Yes     Comment: One 8oz glass  . Drug Use: No  . Sexual Activity: No   Other Topics Concern  . Not on file   Social History Narrative    Family History  Problem Relation Age of Onset  . Stroke Father       Alvino Chapel, MD 04/13/2014, 9:57 AM

## 2014-04-13 NOTE — Patient Instructions (Signed)
Return to see me in one year. We will obtain a CT scan prior to that visit. Continue taking letrozole. Please have Dr. Laurann Montana check a serum cholesterol checked next visit with him.

## 2014-10-03 ENCOUNTER — Encounter: Payer: Self-pay | Admitting: Gynecologic Oncology

## 2014-10-03 ENCOUNTER — Other Ambulatory Visit (HOSPITAL_BASED_OUTPATIENT_CLINIC_OR_DEPARTMENT_OTHER): Payer: 59

## 2014-10-03 ENCOUNTER — Ambulatory Visit: Payer: 59 | Attending: Gynecologic Oncology | Admitting: Gynecologic Oncology

## 2014-10-03 VITALS — BP 127/67 | HR 49 | Temp 98.2°F | Resp 16 | Ht 66.0 in

## 2014-10-03 DIAGNOSIS — Z79811 Long term (current) use of aromatase inhibitors: Secondary | ICD-10-CM | POA: Diagnosis not present

## 2014-10-03 DIAGNOSIS — C569 Malignant neoplasm of unspecified ovary: Secondary | ICD-10-CM | POA: Diagnosis not present

## 2014-10-03 DIAGNOSIS — R1032 Left lower quadrant pain: Secondary | ICD-10-CM

## 2014-10-03 DIAGNOSIS — R918 Other nonspecific abnormal finding of lung field: Secondary | ICD-10-CM | POA: Diagnosis not present

## 2014-10-03 NOTE — Patient Instructions (Signed)
We will call you with the lab results from today and CT scan. Please call us sooner with any questions or concerns you have.

## 2014-10-03 NOTE — Progress Notes (Signed)
Consult Note: Gyn-Onc   [name] 59 y.o. female  Chief Complaint  Patient presents with  . Abdominal Pain  hx of recurrent GCT of the ovary.  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 New symptoms concerning for progression. Currently on Letrozole.  Plan: 1/ check AMH and Inhibin B today (Inhibin has not been a marker in the past). 2/ CT abdo/pelvis to evaluate status of abdomino-pelvic disease. If increased in size, would order chest CT to evaluate pulmonary nodules.  Interval History:  She returns today prior to his scheduled follow-up visit secondary to symptoms of progressive left lower quadrant discomfort. This began a proximally 6 months ago. It is a pulling sharp pain , that is intermittent. She has minimal but some relief with Motrin. Physical activity makes the pain worse. It is not worse with deep location. She does have some hematochezia with straining (feels it is a hemorrhoid - light blood on toilet tissue with wiping). Her functional status is excellent.  HPI:The patient has a long-standing history of a granulosa cell tumor of the ovary dating back to 70. She had a recurrence in 2002 and the upper abdomen which was resected and she was subsequently treated with 6 cycles of intraperitoneal cisplatin and etoposide completed in June of 2002. She was followed between 2002 in 2010 with CT scans. In November 2010 CT scan showed increased size of nodules in the pelvis. At that time she underwent exploratory laparotomy with resection of tumor nodules near the cecum and left pelvic sidewall.. The tumor was estrogen receptor negative and progesterone receptor positive. She alternated two-week courses of Megace and tamoxifen between June and December 2013. At that time CT scan showed progressive disease and the patient was switched to letrozole. She had been doing well on this and was last seen in December 2015 and had stable disease.  Ct chest/abdo/pelvis 04/07/14 :  Right suprarenal low-density lymph node measures 3.6 x 3.0 cm versus 3.7 x 3.1 cm at the same level on the prior exam (when remeasured). And adjacent or contiguous more posterior lateral component measures 8 cm and is unchanged. Otherwise, no retroperitoneal adenopathy. A nodule which is either along the left external iliac chain or within the sigmoid mesocolon measures 1.0 cm and is unchanged. A more superior and lateral pericolonic nodule measures 1.0 cm  versus 1.1 cm at the same level on the prior. More inferior left paracentral nodule measures 1.0 x 0.7 cm today versus 1.1 x 0.8 on the prior. No new abdominal pelvic nodes are nodules are identified. IMPRESSION: CT CHEST IMPRESSION: No acute process or evidence of metastatic disease in the chest. Stable tiny bilateral pulmonary nodules   Review of Systems:10 point review of systems is negative as noted above.   Vitals: Blood pressure 127/67, pulse 49, temperature 98.2 F (36.8 C), temperature source Oral, resp. rate 16, height 5\' 6"  (1.676 m).  Physical Exam: General : The patient is a healthy woman in no acute distress.  HEENT: normocephalic, extraoccular movements normal; neck is supple without thyromegally  Lynphnodes: Supraclavicular and inguinal nodes not enlarged  Abdomen: Soft, non-tender, no ascites, no organomegally, no masses, no hernias  Pelvic:  EGBUS: Normal female  Vagina: Normal, no lesions  Urethra and Bladder: Normal, non-tender  Cervix: Surgically absent  Uterus: Surgically absent  Bi-manual examination: Non-tender; no adenxal masses or nodularity  Rectal: normal sphincter tone, no masses, no blood  Lower extremities: No edema or varicosities. Normal range of motion  Allergies  Allergen Reactions  . Ciprofloxacin Other (See Comments)    FATIGUE  . Codone [Hydrocodone Bitartrate] Itching  . Stadol [Butorphanol Tartrate] Other (See Comments)    ALTERED MENTAL STATUS  . Sulfa Antibiotics Rash    Past  Medical History  Diagnosis Date  . Obesity   . GERD (gastroesophageal reflux disease)   . Granulosa cell tumor of ovary   . Ovarian cancer     Past Surgical History  Procedure Laterality Date  . Abdominal hysterectomy      TAH/BSO  . Secondary tumor debulking  2002  . Ventral hernia repair    . Tumor debulking  2010  . Exploratory laparotomy      for bowel obstruction    Current Outpatient Prescriptions  Medication Sig Dispense Refill  . acetaminophen (TYLENOL) 325 MG tablet Take 650 mg by mouth every 6 (six) hours as needed.    . Calcium Carbonate-Vitamin D (CALTRATE 600+D PO) Take by mouth once a week.    . escitalopram (LEXAPRO) 20 MG tablet Take 20 mg by mouth daily.    Marland Kitchen esomeprazole (NEXIUM) 40 MG capsule Take 40 mg by mouth daily before breakfast.    . letrozole (FEMARA) 2.5 MG tablet Take 1 tablet (2.5 mg total) by mouth daily. 90 tablet 4  . Multiple Vitamins-Minerals (MULTIVITAMIN PO) Take by mouth daily.    . naproxen sodium (ANAPROX) 550 MG tablet Take 550 mg by mouth 2 (two) times daily with a meal.    . polyethylene glycol powder (MIRALAX) powder Take 1 Container by mouth daily.    . Probiotic Product (ALIGN PO) Take by mouth daily.    . Psyllium (METAMUCIL SMOOTH TEXTURE PO) Take 1 scoop by mouth daily.     No current facility-administered medications for this visit.    History   Social History  . Marital Status: Married    Spouse Name: N/A  . Number of Children: N/A  . Years of Education: N/A   Occupational History  . Not on file.   Social History Main Topics  . Smoking status: Never Smoker   . Smokeless tobacco: Not on file  . Alcohol Use: Yes     Comment: One 8oz glass  . Drug Use: No  . Sexual Activity: No   Other Topics Concern  . Not on file   Social History Narrative    Family History  Problem Relation Age of Onset  . Stroke Father       Donaciano Eva, MD 10/03/2014, 12:42 PM

## 2014-10-04 ENCOUNTER — Ambulatory Visit (HOSPITAL_COMMUNITY)
Admission: RE | Admit: 2014-10-04 | Discharge: 2014-10-04 | Disposition: A | Discharge status: Home / Self Care | Payer: 59 | Source: Ambulatory Visit | Attending: Gynecologic Oncology | Admitting: Gynecologic Oncology

## 2014-10-04 ENCOUNTER — Encounter (HOSPITAL_COMMUNITY): Payer: Self-pay

## 2014-10-04 DIAGNOSIS — K760 Fatty (change of) liver, not elsewhere classified: Secondary | ICD-10-CM | POA: Insufficient documentation

## 2014-10-04 DIAGNOSIS — Z79899 Other long term (current) drug therapy: Secondary | ICD-10-CM | POA: Insufficient documentation

## 2014-10-04 DIAGNOSIS — C569 Malignant neoplasm of unspecified ovary: Secondary | ICD-10-CM | POA: Insufficient documentation

## 2014-10-04 DIAGNOSIS — D3501 Benign neoplasm of right adrenal gland: Secondary | ICD-10-CM | POA: Insufficient documentation

## 2014-10-04 DIAGNOSIS — Z90710 Acquired absence of both cervix and uterus: Secondary | ICD-10-CM | POA: Diagnosis not present

## 2014-10-04 DIAGNOSIS — R1032 Left lower quadrant pain: Secondary | ICD-10-CM | POA: Diagnosis not present

## 2014-10-04 DIAGNOSIS — K439 Ventral hernia without obstruction or gangrene: Secondary | ICD-10-CM | POA: Insufficient documentation

## 2014-10-04 MED ORDER — IOHEXOL 300 MG/ML  SOLN
100.0000 mL | Freq: Once | INTRAMUSCULAR | Status: AC | PRN
  Administered 2014-10-04: 100 mL via INTRAVENOUS

## 2014-10-06 ENCOUNTER — Telehealth: Payer: Self-pay | Admitting: *Deleted

## 2014-10-06 LAB — INHIBIN B: Inhibin B: 58 pg/mL

## 2014-10-06 LAB — ANTI MULLERIAN HORMONE: AMH ASSESSR: 2.12 ng/mL

## 2014-10-06 NOTE — Telephone Encounter (Signed)
Per Dr. Denman George, patient notified of CT scan results - it showed no new progression or new disease. No explanation for pain, but good news. Patient agreeable to followup with her PCP regarding left sided abdominal pain.

## 2014-11-10 ENCOUNTER — Other Ambulatory Visit (HOSPITAL_COMMUNITY): Payer: Self-pay | Admitting: Sports Medicine

## 2014-11-10 DIAGNOSIS — M25532 Pain in left wrist: Secondary | ICD-10-CM

## 2014-11-10 DIAGNOSIS — M25562 Pain in left knee: Secondary | ICD-10-CM

## 2014-11-14 ENCOUNTER — Ambulatory Visit (HOSPITAL_COMMUNITY)
Admission: RE | Admit: 2014-11-14 | Discharge: 2014-11-14 | Disposition: A | Discharge status: Home / Self Care | Payer: 59 | Source: Ambulatory Visit | Attending: Sports Medicine | Admitting: Sports Medicine

## 2014-11-14 DIAGNOSIS — M25562 Pain in left knee: Secondary | ICD-10-CM | POA: Diagnosis not present

## 2014-11-18 ENCOUNTER — Ambulatory Visit (HOSPITAL_COMMUNITY)
Admission: RE | Admit: 2014-11-18 | Discharge: 2014-11-18 | Disposition: A | Discharge status: Home / Self Care | Payer: 59 | Source: Ambulatory Visit | Attending: Sports Medicine | Admitting: Sports Medicine

## 2014-11-18 DIAGNOSIS — M659 Synovitis and tenosynovitis, unspecified: Secondary | ICD-10-CM | POA: Insufficient documentation

## 2014-11-18 DIAGNOSIS — M25532 Pain in left wrist: Secondary | ICD-10-CM | POA: Diagnosis present

## 2014-11-18 MED ORDER — GADOBENATE DIMEGLUMINE 529 MG/ML IV SOLN
5.0000 mL | Freq: Once | INTRAVENOUS | Status: AC | PRN
  Administered 2014-11-18: 5 mL via INTRAVENOUS

## 2014-11-18 MED ORDER — IOHEXOL 300 MG/ML  SOLN
50.0000 mL | Freq: Once | INTRAMUSCULAR | Status: AC | PRN
  Administered 2014-11-18: 50 mL

## 2014-11-21 ENCOUNTER — Other Ambulatory Visit: Payer: Self-pay | Admitting: Gynecologic Oncology

## 2014-12-27 ENCOUNTER — Other Ambulatory Visit: Payer: Self-pay | Admitting: Obstetrics & Gynecology

## 2014-12-28 LAB — CYTOLOGY - PAP

## 2015-05-08 ENCOUNTER — Telehealth: Payer: Self-pay

## 2015-05-08 NOTE — Telephone Encounter (Signed)
Patient's call returned , no answer , left a detailed message with call back requested to schedule follow up with Dr Fermin Schwab , call back information provided.

## 2015-05-12 ENCOUNTER — Ambulatory Visit: Payer: 59 | Attending: Gynecology | Admitting: Gynecology

## 2015-05-12 ENCOUNTER — Encounter: Payer: Self-pay | Admitting: Gynecology

## 2015-05-12 ENCOUNTER — Ambulatory Visit (HOSPITAL_BASED_OUTPATIENT_CLINIC_OR_DEPARTMENT_OTHER): Payer: 59

## 2015-05-12 VITALS — BP 89/74 | HR 56 | Temp 98.4°F | Resp 18 | Ht 72.0 in | Wt 206.9 lb

## 2015-05-12 DIAGNOSIS — C569 Malignant neoplasm of unspecified ovary: Secondary | ICD-10-CM | POA: Insufficient documentation

## 2015-05-12 NOTE — Progress Notes (Signed)
Consult Note: Gyn-Onc   [name] 60 y.o. female  Chief Complaint  Patient presents with  . 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 CT scan in June so stable disease. She has persistent left lower quadrant pain which is very site-specific which has persisted for over 6 months.. Otherwise the patient's doing well and is asymptomatic. She is tolerating letrozole well.  Plan: We will continue using letrozole daily.  In order to better assess the patient's soft tissue in the left abdomen we'll obtain an MRI of the abdomen and pelvis. This will also serve for [name] to be able to assess her disease status. Inhibin B and AMH are also obtained today. Interval History: The patient returns today as previously scheduled for followup. Since her last visit she's done well and continues taking letrozole.  Her primary complaint is that of left lower quadrant pain which is very focal. This is the same pain she had when she was seen by Dr. Denman George in June. At that time CT scan showed stable pelvic disease and nothing that would suggest involvement of the left lower quadrant. She denies any other significant abdominal pain and has no other GI or GU symptoms. Her functional status is excellent. 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  HPI:The patient has a long-standing history of a granulosa cell tumor of the ovary dating back to 9. She had a recurrence in 2002 and the upper abdomen which was resected and she was subsequently treated with 6 cycles of intraperitoneal cisplatin and etoposide completed in June of 2002. She was followed between 2002 in 2010 with CT scans. In November 2010 CT scan showed increased size of nodules in the pelvis. At that time she underwent exploratory laparotomy with resection of tumor nodules near the cecum and left pelvic sidewall.. The tumor was estrogen receptor negative and progesterone  receptor positive. She alternated two-week courses of Megace and tamoxifen between June and December 2013. At that time CT scan showed progressive disease and the patient was switched to letrozole.   Review of Systems:10 point review of systems is negative as noted above.   Vitals: Blood pressure 89/74, pulse 56, temperature 98.4 F (36.9 C), temperature source Oral, resp. rate 18, height 6' (1.829 m), weight 206 lb 14.4 oz (93.849 kg), SpO2 99 %.  Physical Exam: General : The patient is a healthy woman in no acute distress.  HEENT: normocephalic, extraoccular movements normal; neck is supple without thyromegally  Lynphnodes: Supraclavicular and inguinal nodes not enlarged  Abdomen: Soft, no ascites, no organomegally, no masses, no hernias . There is point tenderness to palpation in the mid clavicular line in the center of the left lower quadrant. On deep palpation the patient has a moderate amount of pain. I'm unable to palpate any masses although the patient feels that she does have a mass in that area. Pelvic:  EGBUS: Normal female  Vagina: Normal, no lesions  Urethra and Bladder: Normal, non-tender  Cervix: Surgically absent  Uterus: Surgically absent  Bi-manual examination: Non-tender; no adenxal masses or nodularity  Rectal: normal sphincter tone, no masses, no blood  Lower extremities: No edema or varicosities. Normal range of motion       Allergies  Allergen Reactions  . Ciprofloxacin Other (See Comments)    FATIGUE  . Codone [Hydrocodone Bitartrate] Itching  . Stadol [Butorphanol Tartrate] Other (See Comments)    ALTERED  MENTAL STATUS  . Sulfa Antibiotics Rash    Past Medical History  Diagnosis Date  . Obesity   . GERD (gastroesophageal reflux disease)   . Granulosa cell tumor of ovary   . Ovarian cancer [facility])     Past Surgical History  Procedure Laterality Date  . Abdominal hysterectomy      TAH/BSO  . Secondary tumor debulking  2002  . Ventral hernia  repair    . Tumor debulking  2010  . Exploratory laparotomy      for bowel obstruction    Current Outpatient Prescriptions  Medication Sig Dispense Refill  . acetaminophen (TYLENOL) 325 MG tablet Take 650 mg by mouth every 6 (six) hours as needed.    . Calcium Carbonate-Vitamin D (CALTRATE 600+D PO) Take by mouth once a week.    . escitalopram (LEXAPRO) 20 MG tablet Take 10 mg by mouth daily.     Marland Kitchen esomeprazole (NEXIUM) 40 MG capsule Take 40 mg by mouth as needed.     Marland Kitchen letrozole (FEMARA) 2.5 MG tablet TAKE 1 TABLET BY MOUTH ONCE DAILY 90 tablet 4  . Multiple Vitamins-Minerals (MULTIVITAMIN PO) Take by mouth daily.    . naproxen sodium (ANAPROX) 550 MG tablet Take 550 mg by mouth 2 (two) times daily with a meal.    . polyethylene glycol powder (MIRALAX) powder Take 1 Container by mouth daily.    . Probiotic Product (ALIGN PO) Take by mouth daily.    . Psyllium (METAMUCIL SMOOTH TEXTURE PO) Take 1 scoop by mouth daily.     No current facility-administered medications for this visit.    Social History   Social History  . Marital Status: Married    Spouse Name: N/A  . Number of Children: N/A  . Years of Education: N/A   Occupational History  . Not on file.   Social History Main Topics  . Smoking status: Never Smoker   . Smokeless tobacco: Not on file  . Alcohol Use: Yes     Comment: One 8oz glass  . Drug Use: No  . Sexual Activity: No   Other Topics Concern  . Not on file   Social History Narrative    Family History  Problem Relation Age of Onset  . Stroke Father       Alvino Chapel, MD 05/12/2015, 10:43 AM

## 2015-05-12 NOTE — Patient Instructions (Signed)
Plan to schedule an MRI of the abdomen and pelvis.  Phone number is [phone].  Also schedule a lab appointment for an inhibin B and AMH.  Please call for any questions or concerns.  Continue letrozole.

## 2015-05-15 LAB — ANTI MULLERIAN HORMONE: ANTI-MULLERIAN HORMONE (AMH): 3.27 ng/mL

## 2015-05-16 LAB — INHIBIN B: Inhibin B: 64.6 pg/mL — ABNORMAL HIGH (ref 0.0–16.9)

## 2015-05-22 MED FILL — LETROZOLE 2.5 MG TABLET: 2.5 | 90 days supply | Qty: 90 | Fill #2

## 2015-06-01 ENCOUNTER — Ambulatory Visit (HOSPITAL_COMMUNITY)
Admission: RE | Admit: 2015-06-01 | Discharge: 2015-06-01 | Disposition: A | Discharge status: Home / Self Care | Payer: 59 | Source: Ambulatory Visit | Attending: Gynecologic Oncology | Admitting: Gynecologic Oncology

## 2015-06-01 DIAGNOSIS — R1032 Left lower quadrant pain: Secondary | ICD-10-CM | POA: Diagnosis not present

## 2015-06-01 DIAGNOSIS — C569 Malignant neoplasm of unspecified ovary: Secondary | ICD-10-CM | POA: Insufficient documentation

## 2015-06-01 DIAGNOSIS — Z90710 Acquired absence of both cervix and uterus: Secondary | ICD-10-CM | POA: Diagnosis not present

## 2015-06-01 DIAGNOSIS — C786 Secondary malignant neoplasm of retroperitoneum and peritoneum: Secondary | ICD-10-CM | POA: Diagnosis not present

## 2015-06-01 DIAGNOSIS — K760 Fatty (change of) liver, not elsewhere classified: Secondary | ICD-10-CM | POA: Insufficient documentation

## 2015-06-01 MED ORDER — GADOBENATE DIMEGLUMINE 529 MG/ML IV SOLN
20.0000 mL | Freq: Once | INTRAVENOUS | Status: AC | PRN
  Administered 2015-06-01: 19 mL via INTRAVENOUS

## 2015-06-05 ENCOUNTER — Telehealth: Payer: Self-pay | Admitting: Gynecologic Oncology

## 2015-06-05 NOTE — Telephone Encounter (Signed)
Message left for patient about MRI results.  Results also released in [place] on Friday.  Advised to call the office for any needs.

## 2015-07-26 MED FILL — OMEPRAZOLE DR 20 MG CAPSULE: 20 | 90 days supply | Qty: 90 | Fill #1

## 2015-07-26 MED FILL — ESCITALOPRAM 20 MG TABLET: 20 | 90 days supply | Qty: 90 | Fill #2

## 2015-08-01 DIAGNOSIS — M25512 Pain in left shoulder: Secondary | ICD-10-CM | POA: Diagnosis not present

## 2015-08-09 DIAGNOSIS — M7552 Bursitis of left shoulder: Secondary | ICD-10-CM | POA: Diagnosis not present

## 2015-08-09 DIAGNOSIS — M7522 Bicipital tendinitis, left shoulder: Secondary | ICD-10-CM | POA: Diagnosis not present

## 2015-08-09 DIAGNOSIS — M25512 Pain in left shoulder: Secondary | ICD-10-CM | POA: Diagnosis not present

## 2015-08-14 ENCOUNTER — Other Ambulatory Visit (HOSPITAL_COMMUNITY): Payer: Self-pay | Admitting: Orthopedic Surgery

## 2015-08-14 DIAGNOSIS — M25512 Pain in left shoulder: Secondary | ICD-10-CM

## 2015-08-21 ENCOUNTER — Ambulatory Visit (HOSPITAL_COMMUNITY)
Admission: RE | Admit: 2015-08-21 | Discharge: 2015-08-21 | Disposition: A | Discharge status: Home / Self Care | Payer: 59 | Source: Ambulatory Visit | Attending: Orthopedic Surgery | Admitting: Orthopedic Surgery

## 2015-08-21 DIAGNOSIS — M25512 Pain in left shoulder: Secondary | ICD-10-CM | POA: Insufficient documentation

## 2015-08-21 DIAGNOSIS — M19012 Primary osteoarthritis, left shoulder: Secondary | ICD-10-CM | POA: Insufficient documentation

## 2015-08-21 DIAGNOSIS — M7582 Other shoulder lesions, left shoulder: Secondary | ICD-10-CM | POA: Insufficient documentation

## 2015-08-21 MED FILL — NAPROXEN 500 MG TABLET: 500 | 30 days supply | Qty: 60 | Fill #0

## 2015-08-22 MED FILL — traMADol HCL 50 MG TABS: 50 | 7 days supply | Qty: 60 | Fill #0

## 2015-08-24 DIAGNOSIS — M25512 Pain in left shoulder: Secondary | ICD-10-CM | POA: Diagnosis not present

## 2015-08-24 MED FILL — OXYCODONE/APAP 5/325MG: 5-325 | 5 days supply | Qty: 60 | Fill #0

## 2015-08-24 MED FILL — METHOCARBAMOL 750 MG TABLET: 750 | 15 days supply | Qty: 60 | Fill #0

## 2015-08-24 MED FILL — LETROZOLE 2.5 MG TABLET: 2.5 | 90 days supply | Qty: 90 | Fill #3

## 2015-09-06 DIAGNOSIS — M7542 Impingement syndrome of left shoulder: Secondary | ICD-10-CM | POA: Diagnosis not present

## 2015-10-09 DIAGNOSIS — M7542 Impingement syndrome of left shoulder: Secondary | ICD-10-CM | POA: Diagnosis not present

## 2015-10-13 DIAGNOSIS — H04123 Dry eye syndrome of bilateral lacrimal glands: Secondary | ICD-10-CM | POA: Diagnosis not present

## 2015-10-13 DIAGNOSIS — H5213 Myopia, bilateral: Secondary | ICD-10-CM | POA: Diagnosis not present

## 2015-10-13 DIAGNOSIS — H25013 Cortical age-related cataract, bilateral: Secondary | ICD-10-CM | POA: Diagnosis not present

## 2015-10-13 DIAGNOSIS — H2513 Age-related nuclear cataract, bilateral: Secondary | ICD-10-CM | POA: Diagnosis not present

## 2015-11-10 ENCOUNTER — Other Ambulatory Visit: Payer: Self-pay | Admitting: Gynecologic Oncology

## 2015-11-10 ENCOUNTER — Other Ambulatory Visit (HOSPITAL_BASED_OUTPATIENT_CLINIC_OR_DEPARTMENT_OTHER): Payer: 59

## 2015-11-10 DIAGNOSIS — R300 Dysuria: Secondary | ICD-10-CM

## 2015-11-10 DIAGNOSIS — C569 Malignant neoplasm of unspecified ovary: Secondary | ICD-10-CM | POA: Diagnosis not present

## 2015-11-10 LAB — URINALYSIS, MICROSCOPIC - CHCC
BILIRUBIN (URINE): NEGATIVE
Bacteria, UA: NEGATIVE
Glucose: NEGATIVE mg/dL
Ketones: NEGATIVE mg/dL
NITRITE: NEGATIVE
Protein: NEGATIVE mg/dL
Specific Gravity, Urine: 1.02 (ref 1.003–1.035)
Urobilinogen, UR: 0.2 mg/dL (ref 0.2–1)
pH: 5 (ref 4.6–8.0)

## 2015-11-10 MED ORDER — NITROFURANTOIN MONOHYD MACRO 100 MG PO CAPS: 100.0000 mg | ORAL_CAPSULE | Freq: Two times a day (BID) | ORAL | Status: DC

## 2015-11-10 MED FILL — NITROFURANTOIN MONO-MCR 100: 100 | 7 days supply | Qty: 14 | Fill #0

## 2015-11-10 NOTE — Progress Notes (Unsigned)
Patient called reporting dysuria, flank pain, chills.  No fever per pt.  Urine ordered.  Macrobid sent to pharmacy.  Await culture results.

## 2015-11-12 LAB — URINE CULTURE

## 2015-11-28 ENCOUNTER — Other Ambulatory Visit: Payer: Self-pay | Admitting: Gynecologic Oncology

## 2015-11-28 ENCOUNTER — Other Ambulatory Visit: Payer: 59

## 2015-11-28 DIAGNOSIS — R309 Painful micturition, unspecified: Secondary | ICD-10-CM | POA: Diagnosis not present

## 2015-11-28 MED FILL — OMEPRAZOLE DR 20 MG CAPSULE: 20 | 90 days supply | Qty: 90 | Fill #2

## 2015-11-28 MED FILL — LETROZOLE 2.5 MG TABLET: 2.5 | 90 days supply | Qty: 90 | Fill #0

## 2015-12-13 DIAGNOSIS — R300 Dysuria: Secondary | ICD-10-CM | POA: Diagnosis not present

## 2015-12-14 ENCOUNTER — Encounter (HOSPITAL_COMMUNITY): Payer: Self-pay | Admitting: Emergency Medicine

## 2015-12-14 ENCOUNTER — Ambulatory Visit (HOSPITAL_COMMUNITY)
Admission: EM | Admit: 2015-12-14 | Discharge: 2015-12-14 | Disposition: A | Discharge status: Home / Self Care | Payer: 59 | Attending: Emergency Medicine | Admitting: Emergency Medicine

## 2015-12-14 DIAGNOSIS — N390 Urinary tract infection, site not specified: Secondary | ICD-10-CM | POA: Diagnosis not present

## 2015-12-14 DIAGNOSIS — N3289 Other specified disorders of bladder: Secondary | ICD-10-CM

## 2015-12-14 DIAGNOSIS — R300 Dysuria: Secondary | ICD-10-CM

## 2015-12-14 LAB — POCT URINALYSIS DIP (DEVICE)
Glucose, UA: NEGATIVE mg/dL
HGB URINE DIPSTICK: NEGATIVE
Ketones, ur: NEGATIVE mg/dL
NITRITE: NEGATIVE
PH: 5.5 (ref 5.0–8.0)
PROTEIN: 30 mg/dL — AB
Specific Gravity, Urine: >=1.03 (ref 1.005–1.030)
UROBILINOGEN UA: 0.2 mg/dL (ref 0.0–1.0)

## 2015-12-14 MED ORDER — PHENAZOPYRIDINE HCL 200 MG PO TABS
200.0000 mg | ORAL_TABLET | Freq: Three times a day (TID) | ORAL | 0 refills | Status: DC
Start: 2015-12-14 — End: 2016-02-09

## 2015-12-14 NOTE — ED Provider Notes (Signed)
CSN: [id number]     Arrival date & time 12/14/15  1434 History   First MD Initiated Contact with Patient 12/14/15 1512     Chief Complaint  Patient presents with  . Urinary Retention   (Consider location/radiation/quality/duration/timing/severity/associated sxs/prior Treatment) HPI  [name] is a 60 y.o. female presenting to UC with c/o bladder pressure and difficulty urinating.  She is concerned she may have urinary retention.  She has been having UTI symptoms of dysuria and frequency.  Her PCP called in Keflex, she has taken 3 doses since starting yesterday but she has not had any relief. She reports only being able to void a small amount. Denies hematuria. Denies fever, chills, n/v/d.  She has never had to have a catheter in the past.  She notes she had a UTI 2 months ago and did well on Macrobid but notes she was also taking Azo at that time. She has not tried Azo or pyridium yet for current symptoms.    Past Medical History:  Diagnosis Date  . GERD (gastroesophageal reflux disease)   . Granulosa cell tumor of ovary   . Obesity   . Ovarian cancer [facility])    Past Surgical History:  Procedure Laterality Date  . ABDOMINAL HYSTERECTOMY     TAH/BSO  . EXPLORATORY LAPAROTOMY     for bowel obstruction  . Secondary tumor debulking  2002  . Tumor debulking  2010  . VENTRAL HERNIA REPAIR     Family History  Problem Relation Age of Onset  . Stroke Father    Social History  Substance Use Topics  . Smoking status: Never Smoker  . Smokeless tobacco: Never Used  . Alcohol use Yes     Comment: One 8oz glass   OB History    No data available     Review of Systems  Constitutional: Negative for chills and fever.  Gastrointestinal: Positive for abdominal pain ( suprapubic). Negative for diarrhea, nausea and vomiting.  Genitourinary: Positive for decreased urine volume, dysuria, frequency, pelvic pain ( bladder pressure) and urgency. Negative for flank pain and hematuria.   Musculoskeletal: Negative for arthralgias and joint swelling.    Allergies  Ciprofloxacin; Codone [hydrocodone bitartrate]; Stadol [butorphanol tartrate]; and Sulfa antibiotics  Home Medications   Prior to Admission medications   Medication Sig Start Date End Date Taking? Authorizing Provider  cephALEXin (KEFLEX) 500 MG capsule Take 500 mg by mouth 4 (four) times daily.   Yes Historical Provider, MD  escitalopram (LEXAPRO) 20 MG tablet Take 10 mg by mouth daily.    Yes Historical Provider, MD  letrozole (FEMARA) 2.5 MG tablet TAKE 1 TABLET BY MOUTH ONCE DAILY 11/28/15  Yes Melissa D Cross, NP  methocarbamol (ROBAXIN) 500 MG tablet Take 500 mg by mouth 4 (four) times daily.   Yes Historical Provider, MD  omeprazole (PRILOSEC) 20 MG capsule Take 20 mg by mouth daily.   Yes Historical Provider, MD  acetaminophen (TYLENOL) 325 MG tablet Take 650 mg by mouth every 6 (six) hours as needed.    Historical Provider, MD  Calcium Carbonate-Vitamin D (CALTRATE 600+D PO) Take by mouth once a week.    Historical Provider, MD  esomeprazole (NEXIUM) 40 MG capsule Take 40 mg by mouth as needed.     Historical Provider, MD  Multiple Vitamins-Minerals (MULTIVITAMIN PO) Take by mouth daily.    Historical Provider, MD  naproxen sodium (ANAPROX) 550 MG tablet Take 550 mg by mouth 2 (two) times daily with a meal.  Historical Provider, MD  nitrofurantoin, macrocrystal-monohydrate, (MACROBID) 100 MG capsule Take 1 capsule (100 mg total) by mouth 2 (two) times daily. 11/10/15   Dorothyann Gibbs, NP  phenazopyridine (PYRIDIUM) 200 MG tablet Take 1 tablet (200 mg total) by mouth 3 (three) times daily. 12/14/15   Noland Fordyce, PA-C  polyethylene glycol powder (MIRALAX) powder Take 1 Container by mouth daily.    Historical Provider, MD  Probiotic Product (ALIGN PO) Take by mouth daily.    Historical Provider, MD  Psyllium (METAMUCIL SMOOTH TEXTURE PO) Take 1 scoop by mouth daily.    Historical Provider, MD   Meds  Ordered and Administered this Visit  Medications - No data to display  BP 119/67 (BP Location: Left Arm)   Pulse 60   Temp 98.7 F (37.1 C) (Oral)   Resp 16   SpO2 97%  No data found.   Physical Exam  Constitutional: She appears well-developed and well-nourished. No distress.  HENT:  Head: Normocephalic and atraumatic.  Mouth/Throat: Oropharynx is clear and moist.  Eyes: Conjunctivae are normal. No scleral icterus.  Neck: Normal range of motion.  Cardiovascular: Normal rate, regular rhythm and normal heart sounds.   Pulmonary/Chest: Effort normal and breath sounds normal. No respiratory distress. She has no wheezes. She has no rales.  Abdominal: Soft. She exhibits no distension and no mass. There is tenderness ( suprapubic) in the suprapubic area. There is no rigidity, no rebound, no guarding and no CVA tenderness.  Musculoskeletal: Normal range of motion.  Neurological: She is alert.  Skin: Skin is warm and dry. She is not diaphoretic.  Nursing note and vitals reviewed.   Urgent Care Course   Clinical Course    Procedures (including critical care time)  Labs Review Labs Reviewed  POCT URINALYSIS DIP (DEVICE) - Abnormal; Notable for the following:       Result Value   Bilirubin Urine SMALL (*)    Protein, ur 30 (*)    Leukocytes, UA TRACE (*)    All other components within normal limits    Imaging Review No results found.   MDM   1. UTI (lower urinary tract infection)   2. Bladder spasms   3. Dysuria    Pt c/o continued urinary symptoms despite starting on Keflex yesterday. Pt appears well, moist mucous membranes, afebrile.  No CVAT. Pt was able to provide small urine sample in UC.  Discussed bladder scan vs trying symptomatic treatment.  Pt would like to try Azo.  Advised to f/u with PCP if not improving. If symptoms worsen-unable to void even after Azo, she should go to emergency department for bladder scan. Patient verbalized understanding and agreement  with treatment plan.     Noland Fordyce, PA-C 12/14/15 1656

## 2015-12-14 NOTE — ED Triage Notes (Signed)
The patient presented to the [facility] with a complaint of a recurrent UTI and she believes that she may be in Urinary retention. The patient stated that she has been having symptoms of a UTI for 3 days and her PCP called in Keflex that she started yesterday. She stated that she feels worse today and that she has not been able to void but a small amount. The patient stated that previous cultures were responsive to [place].

## 2015-12-14 NOTE — Discharge Instructions (Signed)
°  Please continue to take your antibiotic as prescribed and complete the entire course even if you start to feel better, unless advised otherwise by your primary care provider when urine culture comes back.  You may take over the counter Azo or the prescribed pyridium for bladder spasms, pain and pressure.  Take either or but not both as they are essentially the same medication.  The medication causes your urine to turn orange in color. This is normal.

## 2016-01-08 DIAGNOSIS — M7542 Impingement syndrome of left shoulder: Secondary | ICD-10-CM | POA: Diagnosis not present

## 2016-01-12 DIAGNOSIS — L918 Other hypertrophic disorders of the skin: Secondary | ICD-10-CM | POA: Diagnosis not present

## 2016-01-12 DIAGNOSIS — L281 Prurigo nodularis: Secondary | ICD-10-CM | POA: Diagnosis not present

## 2016-01-18 DIAGNOSIS — R300 Dysuria: Secondary | ICD-10-CM | POA: Diagnosis not present

## 2016-01-19 MED FILL — AMOXICILLIN 250 MG CAPSULE: 250 | 7 days supply | Qty: 21 | Fill #0

## 2016-01-31 DIAGNOSIS — N390 Urinary tract infection, site not specified: Secondary | ICD-10-CM | POA: Diagnosis not present

## 2016-02-01 ENCOUNTER — Other Ambulatory Visit: Payer: Self-pay | Admitting: Gynecologic Oncology

## 2016-02-01 DIAGNOSIS — D3910 Neoplasm of uncertain behavior of unspecified ovary: Secondary | ICD-10-CM

## 2016-02-06 ENCOUNTER — Other Ambulatory Visit: Payer: Self-pay | Admitting: Gynecologic Oncology

## 2016-02-06 ENCOUNTER — Other Ambulatory Visit (HOSPITAL_BASED_OUTPATIENT_CLINIC_OR_DEPARTMENT_OTHER): Payer: 59

## 2016-02-06 DIAGNOSIS — C569 Malignant neoplasm of unspecified ovary: Secondary | ICD-10-CM

## 2016-02-06 DIAGNOSIS — N390 Urinary tract infection, site not specified: Secondary | ICD-10-CM | POA: Diagnosis not present

## 2016-02-06 DIAGNOSIS — D3910 Neoplasm of uncertain behavior of unspecified ovary: Secondary | ICD-10-CM

## 2016-02-06 LAB — URINALYSIS, MICROSCOPIC - CHCC
Bilirubin (Urine): NEGATIVE
Blood: NEGATIVE
GLUCOSE UR CHCC: NEGATIVE mg/dL
Ketones: NEGATIVE mg/dL
Leukocyte Esterase: NEGATIVE
NITRITE: NEGATIVE
PH: 6 (ref 4.6–8.0)
PROTEIN: NEGATIVE mg/dL
RBC / HPF: NEGATIVE (ref 0–2)
Specific Gravity, Urine: 1.03 (ref 1.003–1.035)
UROBILINOGEN UR: 0.2 mg/dL (ref 0.2–1)

## 2016-02-07 LAB — URINE CULTURE: Organism ID, Bacteria: NO GROWTH

## 2016-02-08 ENCOUNTER — Ambulatory Visit (HOSPITAL_COMMUNITY)
Admission: RE | Admit: 2016-02-08 | Discharge: 2016-02-08 | Disposition: A | Discharge status: Home / Self Care | Payer: 59 | Source: Ambulatory Visit | Attending: Gynecologic Oncology | Admitting: Gynecologic Oncology

## 2016-02-08 ENCOUNTER — Telehealth: Payer: Self-pay | Admitting: Gynecologic Oncology

## 2016-02-08 DIAGNOSIS — C7989 Secondary malignant neoplasm of other specified sites: Secondary | ICD-10-CM | POA: Diagnosis not present

## 2016-02-08 DIAGNOSIS — D3910 Neoplasm of uncertain behavior of unspecified ovary: Secondary | ICD-10-CM

## 2016-02-08 DIAGNOSIS — C569 Malignant neoplasm of unspecified ovary: Secondary | ICD-10-CM | POA: Diagnosis not present

## 2016-02-08 DIAGNOSIS — K769 Liver disease, unspecified: Secondary | ICD-10-CM | POA: Diagnosis not present

## 2016-02-08 DIAGNOSIS — R109 Unspecified abdominal pain: Secondary | ICD-10-CM | POA: Diagnosis not present

## 2016-02-08 LAB — INHIBIN B: Inhibin B: 62.4 pg/mL — ABNORMAL HIGH (ref 0.0–16.9)

## 2016-02-08 MED ORDER — GADOBENATE DIMEGLUMINE 529 MG/ML IV SOLN
20.0000 mL | Freq: Once | INTRAVENOUS | Status: AC | PRN
  Administered 2016-02-08: 20 mL via INTRAVENOUS

## 2016-02-08 NOTE — Telephone Encounter (Signed)
Informed of urine culture results.  No concerns voiced.  Advised to follow up as planned.

## 2016-02-09 ENCOUNTER — Encounter: Payer: Self-pay | Admitting: Gynecology

## 2016-02-09 ENCOUNTER — Ambulatory Visit: Payer: 59 | Attending: Gynecology | Admitting: Gynecology

## 2016-02-09 VITALS — BP 126/35 | HR 61 | Temp 97.8°F | Resp 18 | Ht 66.0 in | Wt 208.5 lb

## 2016-02-09 DIAGNOSIS — C569 Malignant neoplasm of unspecified ovary: Secondary | ICD-10-CM | POA: Diagnosis not present

## 2016-02-09 DIAGNOSIS — Z87440 Personal history of urinary (tract) infections: Secondary | ICD-10-CM | POA: Insufficient documentation

## 2016-02-09 DIAGNOSIS — C7989 Secondary malignant neoplasm of other specified sites: Secondary | ICD-10-CM | POA: Diagnosis not present

## 2016-02-09 DIAGNOSIS — K219 Gastro-esophageal reflux disease without esophagitis: Secondary | ICD-10-CM | POA: Diagnosis not present

## 2016-02-09 DIAGNOSIS — Z8543 Personal history of malignant neoplasm of ovary: Secondary | ICD-10-CM | POA: Insufficient documentation

## 2016-02-09 DIAGNOSIS — E669 Obesity, unspecified: Secondary | ICD-10-CM | POA: Insufficient documentation

## 2016-02-09 DIAGNOSIS — K769 Liver disease, unspecified: Secondary | ICD-10-CM | POA: Diagnosis not present

## 2016-02-09 DIAGNOSIS — Z9221 Personal history of antineoplastic chemotherapy: Secondary | ICD-10-CM | POA: Diagnosis not present

## 2016-02-09 DIAGNOSIS — Z9889 Other specified postprocedural states: Secondary | ICD-10-CM | POA: Insufficient documentation

## 2016-02-09 DIAGNOSIS — Z888 Allergy status to other drugs, medicaments and biological substances status: Secondary | ICD-10-CM | POA: Diagnosis not present

## 2016-02-09 DIAGNOSIS — Z79899 Other long term (current) drug therapy: Secondary | ICD-10-CM | POA: Insufficient documentation

## 2016-02-09 LAB — ANTI MULLERIAN HORMONE: ANTI-MULLERIAN HORMONE (AMH): 1.84 ng/mL

## 2016-02-09 MED ORDER — TAMOXIFEN CITRATE 20 MG PO TABS: 20.0000 mg | ORAL_TABLET | Freq: Every day | ORAL | 6 refills | Status: DC

## 2016-02-09 MED ORDER — MEGESTROL ACETATE 40 MG PO TABS: 40.0000 mg | ORAL_TABLET | Freq: Three times a day (TID) | ORAL | 6 refills | Status: DC

## 2016-02-09 MED FILL — TAMOXIFEN 20 MG TABLET: 20 | 30 days supply | Qty: 30 | Fill #0

## 2016-02-09 MED FILL — MEGESTROL 40 MG TABLET: 40 | 30 days supply | Qty: 90 | Fill #0

## 2016-02-09 NOTE — Patient Instructions (Signed)
Plan to begin taking Tamoxifen 20 mg daily for two weeks then alternate with Megace 40 mg three times a day for two weeks.  We will reassess your status in three months with another MRI and labs.    Tamoxifen oral tablet What is this medicine? TAMOXIFEN (ta MOX i fen) blocks the effects of estrogen. It is commonly used to treat breast cancer. It is also used to decrease the chance of breast cancer coming back in women who have received treatment for the disease. It may also help prevent breast cancer in women who have a high risk of developing breast cancer. This medicine may be used for other purposes; ask your health care provider or pharmacist if you have questions. What should I tell my health care provider before I take this medicine? They need to know if you have any of these conditions: -blood clots -blood disease -cataracts or impaired eyesight -endometriosis -high calcium levels -high cholesterol -irregular menstrual cycles -liver disease -stroke -uterine fibroids -an unusual or allergic reaction to tamoxifen, other medicines, foods, dyes, or preservatives -pregnant or trying to get pregnant -breast-feeding How should I use this medicine? Take this medicine by mouth with a glass of water. Follow the directions on the prescription label. You can take it with or without food. Take your medicine at regular intervals. Do not take your medicine more often than directed. Do not stop taking except on your doctor's advice. A special MedGuide will be given to you by the pharmacist with each prescription and refill. Be sure to read this information carefully each time. Talk to your pediatrician regarding the use of this medicine in children. While this drug may be prescribed for selected conditions, precautions do apply. Overdosage: If you think you have taken too much of this medicine contact a poison control center or emergency room at once. NOTE: This medicine is only for you. Do not  share this medicine with others. What if I miss a dose? If you miss a dose, take it as soon as you can. If it is almost time for your next dose, take only that dose. Do not take double or extra doses. What may interact with this medicine? -aminoglutethimide -bromocriptine -chemotherapy drugs -female hormones, like estrogens and birth control pills -letrozole -medroxyprogesterone -phenobarbital -rifampin -warfarin This list may not describe all possible interactions. Give your health care provider a list of all the medicines, herbs, non-prescription drugs, or dietary supplements you use. Also tell them if you smoke, drink alcohol, or use illegal drugs. Some items may interact with your medicine. What should I watch for while using this medicine? Visit your doctor or health care professional for regular checks on your progress. You will need regular pelvic exams, breast exams, and mammograms. If you are taking this medicine to reduce your risk of getting breast cancer, you should know that this medicine does not prevent all types of breast cancer. If breast cancer or other problems occur, there is no guarantee that it will be found at an early stage. Do not become pregnant while taking this medicine or for 2 months after stopping this medicine. Stop taking this medicine if you get pregnant or think you are pregnant and contact your doctor. This medicine may harm your unborn baby. Women who can possibly become pregnant should use birth control methods that do not use hormones during tamoxifen treatment and for 2 months after therapy has stopped. Talk with your health care provider for birth control advice. Do not breast feed  while taking this medicine. What side effects may I notice from receiving this medicine? Side effects that you should report to your doctor or health care professional as soon as possible: -changes in vision (blurred vision) -changes in your menstrual cycle -difficulty  breathing or shortness of breath -difficulty walking or talking -new breast lumps -numbness -pelvic pain or pressure -redness, blistering, peeling or loosening of the skin, including inside the mouth -skin rash or itching (hives) -sudden chest pain -swelling of lips, face, or tongue -swelling, pain or tenderness in your calf or leg -unusual bruising or bleeding -vaginal discharge that is bloody, brown, or rust -weakness -yellowing of the whites of the eyes or skin Side effects that usually do not require medical attention (report to your doctor or health care professional if they continue or are bothersome): -fatigue -hair loss, although uncommon and is usually mild -headache -hot flashes -impotence (in men) -nausea, vomiting (mild) -vaginal discharge (white or clear) This list may not describe all possible side effects. Call your doctor for medical advice about side effects. You may report side effects to FDA at 1-800-FDA-1088. Where should I keep my medicine? Keep out of the reach of children. Store at room temperature between 20 and 25 degrees C (68 and 77 degrees F). Protect from light. Keep container tightly closed. Throw away any unused medicine after the expiration date. NOTE: This sheet is a summary. It may not cover all possible information. If you have questions about this medicine, talk to your doctor, pharmacist, or health care provider.    2016, Elsevier/Gold Standard. (2007-12-24 12:01:56)  Megestrol tablets What is this medicine? MEGESTROL (me JES trol) belongs to a class of drugs known as progestins. Megestrol tablets are used to treat advanced breast or endometrial cancer. This medicine may be used for other purposes; ask your health care provider or pharmacist if you have questions. What should I tell my health care provider before I take this medicine? They need to know if you have any of these conditions: -adrenal gland problems -history of blood clots of the  legs, lungs, or other parts of the body -diabetes -kidney disease -liver disease -stroke -an unusual or allergic reaction to megestrol, other medicines, foods, dyes, or preservatives -pregnant or trying to get pregnant -breast-feeding How should I use this medicine? Take this medicine by mouth. Follow the directions on the prescription label. Do not take your medicine more often than directed. Take your doses at regular intervals. Do not stop taking except on the advice of your doctor or health care professional. Talk to your pediatrician regarding the use of this medicine in children. Special care may be needed. Overdosage: If you think you have taken too much of this medicine contact a poison control center or emergency room at once. NOTE: This medicine is only for you. Do not share this medicine with others. What if I miss a dose? If you miss a dose, take it as soon as you can. If it is almost time for your next dose, take only that dose. Do not take double or extra doses. What may interact with this medicine? Do not take this medicine with any of the following medications: -dofetilide This medicine may also interact with the following medications: -carbamazepine -indinavir -phenobarbital -phenytoin -primidone -rifampin -warfarin This list may not describe all possible interactions. Give your health care provider a list of all the medicines, herbs, non-prescription drugs, or dietary supplements you use. Also tell them if you smoke, drink alcohol, or use illegal  drugs. Some items may interact with your medicine. What should I watch for while using this medicine? Visit your doctor or health care professional for regular checks on your progress. Continue taking this medicine even if you feel better. It may take 2 months of regular use before you know if this medicine is working for your condition. If you are a female of child-bearing age, use an effective method of birth control while  you are taking this medicine. This medicine should not be used by females who are pregnant or breast-feeding. There is a potential for serious side effects to an unborn child or to an infant. Talk to your health care professional or pharmacist for more information. If you have diabetes, this medicine may affect blood sugar levels. Check your blood sugar and talk to your doctor or health care professional if you notice changes. What side effects may I notice from receiving this medicine? Side effects that you should report to your doctor or health care professional as soon as possible: -difficulty breathing or shortness of breath -chest pain -dizziness -fluid retention -increased blood pressure -leg pain or swelling -nausea and vomiting -skin rash or itching -weakness Side effects that usually do not require medical attention (report to your doctor or health care professional if they continue or are bothersome): -breakthrough menstrual bleeding -hot flashes or flushing -increased appetite -mood changes -sweating -weight gain This list may not describe all possible side effects. Call your doctor for medical advice about side effects. You may report side effects to FDA at 1-800-FDA-1088. Where should I keep my medicine? Keep out of the reach of children. Store at controlled room temperature between 15 and 30 degrees C (59 and 86 degrees F). Protect from heat above 40 degrees C (104 degrees F). Throw away any unused medicine after the expiration date. NOTE: This sheet is a summary. It may not cover all possible information. If you have questions about this medicine, talk to your doctor, pharmacist, or health care provider.    2016, Elsevier/Gold Standard. (2007-10-26 15:57:10)

## 2016-02-09 NOTE — Progress Notes (Signed)
Consult Note: Gyn-Onc   [name] 60 y.o. female  Chief Complaint  Patient presents with  . 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 Plan:  I reviewed the patient's recent MRI with her demonstrating slight progression of known disease and a new 1.5 cm lesion on the liver capsule. Management options using chemotherapy or hormonal manipulation were reviewed. We have settled on a new regimen of tamoxifen 20 mg daily for 2 weeks alternating with Megace 40 mg 3 times a day for 2 weeks. We will reassess her status in 3 months with another MRI of the abdomen and pelvis.  The patient has recently been referred to urology for further evaluation of recurrent urinary tract infections. Interval History: The patient returns today as previously scheduled for followup. Since her last visit she's done well and continues taking letrozole.  She has had recurrent urinary tract infections over the summer although her most recent urine culture on 02/06/2016 was negative (the patient had recently just finished a course of antibiotics. Her symptoms are minimal although she does have some discomfort in the left lower quadrant which could be consistent with her peritoneal disease on the left side the pelvis.  An MRI obtained on October 19 shows a new lesion measuring 1.5 cm on the liver capsule. There also are several serosal implants posterior to the aspect the right lobe of the liver and above the right kidney (these are stable with the largest lesion measuring 3.6 x 2.8 x 3.4 cm up. 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 Interestingly, the patient's inhibin B has been stable over the past 9 months most recently 62 units. AMH is pending.  Overall, Her functional status is excellent. 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  HPI:The patient has a long-standing history of a granulosa cell tumor of the ovary dating back to 63. She had a recurrence in 2002 and the upper abdomen which was resected and she was subsequently treated with 6 cycles of intraperitoneal cisplatin and etoposide completed in June of 2002. She was followed between 2002 in 2010 with CT scans. In November 2010 CT scan showed increased size of nodules in the pelvis. At that time she underwent exploratory laparotomy with resection of tumor nodules near the cecum and left pelvic sidewall.. The tumor was estrogen receptor negative and progesterone receptor positive. She alternated two-week courses of Megace and tamoxifen between June and December 2013. At that time CT scan showed progressive disease and the patient was switched to letrozole.  In October 2017 the patient was found to have progressive disease on MRI and her management regimen was changed to tamoxifen 20 mg daily for 2 weeks alternating with Megace 40 mg 3 times a day for 2 weeks.   Review of Systems:10 point review of systems is negative as noted above.   Vitals: Blood pressure (!) 126/35, pulse 61, temperature 97.8 F (36.6 C), temperature source Oral, resp. rate 18, height 5\' 6"  (1.676 m), weight 208 lb 8 oz (94.6 kg), SpO2 98 %.  Physical Exam: General : The patient is a healthy woman in no acute distress.  HEENT: normocephalic, extraoccular movements normal; neck is supple without thyromegally  Lynphnodes: Supraclavicular and inguinal nodes not enlarged  Abdomen: Soft, no ascites, no organomegally, no masses, no hernias .  Pelvic:  EGBUS: Normal female  Vagina: Normal, no lesions  Urethra and Bladder: Normal, non-tender  Cervix: Surgically absent  Uterus: Surgically absent  Bi-manual examination: Non-tender; no adenxal masses or nodularity  Rectal: normal sphincter tone, no masses, no blood   Lower extremities: No edema or varicosities. Normal range of motion        Allergies  Allergen Reactions  . Ciprofloxacin Other (See Comments)    FATIGUE  . Codone [Hydrocodone Bitartrate] Itching  . Stadol [Butorphanol Tartrate] Other (See Comments)    ALTERED MENTAL STATUS  . Sulfa Antibiotics Rash    Past Medical History:  Diagnosis Date  . GERD (gastroesophageal reflux disease)   . Granulosa cell tumor of ovary ([place])   . Obesity   . Ovarian cancer [facility])     Past Surgical History:  Procedure Laterality Date  . ABDOMINAL HYSTERECTOMY     TAH/BSO  . EXPLORATORY LAPAROTOMY     for bowel obstruction  . Secondary tumor debulking  2002  . Tumor debulking  2010  . VENTRAL HERNIA REPAIR      Current Outpatient Prescriptions  Medication Sig Dispense Refill  . escitalopram (LEXAPRO) 20 MG tablet Take 10 mg by mouth daily.     Marland Kitchen ibuprofen (ADVIL,MOTRIN) 200 MG tablet Take 600 mg by mouth every 6 (six) hours as needed for mild pain.    Marland Kitchen letrozole (FEMARA) 2.5 MG tablet TAKE 1 TABLET BY MOUTH ONCE DAILY 90 tablet 4  . methocarbamol (ROBAXIN) 500 MG tablet Take 500 mg by mouth at bedtime as needed for muscle spasms.     . naproxen sodium (ANAPROX) 550 MG tablet Take 550 mg by mouth 2 (two) times daily as needed for mild pain.     Marland Kitchen omeprazole (PRILOSEC) 20 MG capsule Take 20 mg by mouth daily as needed (heartburn).     . polyethylene glycol (MIRALAX / GLYCOLAX) packet Take 17 g by mouth daily as needed for mild constipation.    . Prenatal Vit-Fe Fumarate-FA (PRENATAL MULTIVITAMIN) TABS tablet Take 1 tablet by mouth daily at 12 noon.    . Probiotic Product (ALIGN PO) Take 1 tablet by mouth daily.      No current facility-administered medications for this visit.     Social History   Social History  . Marital status: Married    Spouse name: N/A  . Number of children: N/A  . Years of education: N/A   Occupational History  . Not on file.   Social History Main Topics  . Smoking status: Never Smoker  . Smokeless tobacco: Never Used  .  Alcohol use Yes     Comment: One 8oz glass  . Drug use: No  . Sexual activity: No   Other Topics Concern  . Not on file   Social History Narrative  . No narrative on file    Family History  Problem Relation Age of Onset  . Stroke Father       [name], MD 02/09/2016, 9:32 AM

## 2016-02-12 DIAGNOSIS — Z1231 Encounter for screening mammogram for malignant neoplasm of breast: Secondary | ICD-10-CM | POA: Diagnosis not present

## 2016-02-12 NOTE — Pre-Procedure Instructions (Addendum)
    DORACE SCINTA  02/12/2016      [place], [place] - [place] [address] Phone: [phone] Fax: [phone]  [facility] [place], [place] - [place] [id number] [place] [id number] Phone: [phone] Fax: [phone]    Your procedure is scheduled on Thursday, November 2.  Report to [facility] Admitting at 10:30 AM                For any other questions, please call [phone], Monday - Friday 8 AM - 4 PM.   Call this number if you have problems the morning of surgery:[phone]                    Remember:  Do not eat food or drink liquids after midnight Wednesday, November 1.  Take these medicines the morning of surgery with A SIP OF WATER : escitalopram (LEXAPRO), prilosec               Take if needed: methocarbamol (ROBAXIN)                    1 Week prior to surgery STOP taking Aspirin , Aspirin Products (Goody Powder, Excedrin Migraine), Ibuprofen (Advil), Naproxen (Aleve), Vitamins and Herbal Products (ie Fish Oil)   Do not wear jewelry, make-up or nail polish.  Do not wear lotions, powders, or perfumes, or deoderant.  Do not shave 48 hours prior to surgery.               Do not bring valuables to the hospital.  [facility] is not responsible for any belongings or valuables.  Contacts, dentures or bridgework may not be worn into surgery.  Leave your suitcase in the car.  After surgery it may be brought to your room.  For patients admitted to the hospital, discharge time will be determined by your treatment team.  Patients discharged the day of surgery will not be allowed to drive home.   Name and phone number of your driver:  Ardelle Anton  Special instructions:   Review all handouts  Please read over the following fact sheets that you were given. [facility]- Preparing For Surgery and Patient Instructions for Mupirocin  Application, Incentive Spirometry

## 2016-02-13 ENCOUNTER — Encounter (HOSPITAL_COMMUNITY): Payer: Self-pay

## 2016-02-13 ENCOUNTER — Encounter (HOSPITAL_COMMUNITY)
Admission: RE | Admit: 2016-02-13 | Discharge: 2016-02-13 | Disposition: A | Discharge status: Home / Self Care | Payer: 59 | Source: Ambulatory Visit | Attending: Orthopedic Surgery | Admitting: Orthopedic Surgery

## 2016-02-13 DIAGNOSIS — M19012 Primary osteoarthritis, left shoulder: Secondary | ICD-10-CM | POA: Diagnosis not present

## 2016-02-13 DIAGNOSIS — Z01818 Encounter for other preprocedural examination: Secondary | ICD-10-CM | POA: Insufficient documentation

## 2016-02-13 HISTORY — DX: Anxiety disorder, unspecified: F41.9

## 2016-02-13 HISTORY — DX: Chronic kidney disease, stage 3 (moderate): N18.3

## 2016-02-13 HISTORY — DX: Personal history of other diseases of the digestive system: Z87.19

## 2016-02-13 HISTORY — DX: Nausea with vomiting, unspecified: R11.2

## 2016-02-13 HISTORY — DX: Cervical root disorders, not elsewhere classified: G54.2

## 2016-02-13 HISTORY — DX: Unspecified osteoarthritis, unspecified site: M19.90

## 2016-02-13 HISTORY — DX: Other specified postprocedural states: Z98.890

## 2016-02-13 HISTORY — DX: Chronic kidney disease, stage 3 unspecified: N18.30

## 2016-02-13 LAB — BASIC METABOLIC PANEL
ANION GAP: 9 (ref 5–15)
BUN: 20 mg/dL (ref 6–20)
CHLORIDE: 104 mmol/L (ref 101–111)
CO2: 26 mmol/L (ref 22–32)
Calcium: 9.6 mg/dL (ref 8.9–10.3)
Creatinine, Ser: 1.15 mg/dL — ABNORMAL HIGH (ref 0.44–1.00)
GFR calc Af Amer: 59 mL/min — ABNORMAL LOW (ref >60)
GFR calc non Af Amer: 51 mL/min — ABNORMAL LOW (ref >60)
Glucose, Bld: 100 mg/dL — ABNORMAL HIGH (ref 65–99)
POTASSIUM: 4.1 mmol/L (ref 3.5–5.1)
SODIUM: 139 mmol/L (ref 135–145)

## 2016-02-13 LAB — CBC
HEMATOCRIT: 41.2 % (ref 36.0–46.0)
HEMOGLOBIN: 14.3 g/dL (ref 12.0–15.0)
MCH: 30.4 pg (ref 26.0–34.0)
MCHC: 34.7 g/dL (ref 30.0–36.0)
MCV: 87.7 fL (ref 78.0–100.0)
Platelets: 258 10*3/uL (ref 150–400)
RBC: 4.7 MIL/uL (ref 3.87–5.11)
RDW: 11.7 % (ref 11.5–15.5)
WBC: 4.7 10*3/uL (ref 4.0–10.5)

## 2016-02-13 NOTE — Progress Notes (Addendum)
PCP:Dr. [name] Gyn/Oncology: Dr. Jeanann Lewandowsky  Urologist: [facility], pt. Doesn't remember name of MD, seeing for recurrent UTI'S  Pt. States she runs a low pulse at times, usually in AM.

## 2016-02-14 ENCOUNTER — Encounter (HOSPITAL_COMMUNITY): Payer: Self-pay

## 2016-02-14 NOTE — Progress Notes (Signed)
Anesthesia Chart Review:  Pt is a 60 year old female scheduled for L shoulder arthroscopy with subacromial decompression and distal clavicle excision, possible rotator cuff repair on 02/22/2016 with Justice Britain, MD.   - PCP is [name], MD, last office visit 11/15/14.  PMH includes:  Ovarian cancer, CKD (stage 3), post-op N/V, GERD. Never smoker. BMI 34  BP (!) 122/53   Pulse (!) 49   Temp 36.8 C (Oral)   Resp 20   Ht 5\' 6"  (1.676 m)   Wt 211 lb 2 oz (95.8 kg)   SpO2 99%   BMI 34.08 kg/m    HR noted to be bradycardic in the 50's at office visits listed in EPIC dating back to 2014.    Medications include: prilosec  Preoperative labs reviewed.    Will get EKG DOS to further eval bradycardia. If EKG acceptable, I anticipate pt can proceed as scheduled.   Willeen Cass, FNP-BC [facility] Short Stay Surgical Center/Anesthesiology Phone: [phone] 02/14/2016 4:04 PM

## 2016-02-19 DIAGNOSIS — N390 Urinary tract infection, site not specified: Secondary | ICD-10-CM | POA: Diagnosis not present

## 2016-02-22 ENCOUNTER — Ambulatory Visit (HOSPITAL_COMMUNITY)
Admission: RE | Admit: 2016-02-22 | Discharge: 2016-02-22 | Disposition: A | Discharge status: Home / Self Care | Payer: 59 | Source: Ambulatory Visit | Attending: Orthopedic Surgery | Admitting: Orthopedic Surgery

## 2016-02-22 ENCOUNTER — Ambulatory Visit (HOSPITAL_COMMUNITY): Payer: 59 | Admitting: Anesthesiology

## 2016-02-22 ENCOUNTER — Encounter (HOSPITAL_COMMUNITY)
Admission: RE | Disposition: A | Discharge status: Home / Self Care | Payer: Self-pay | Source: Ambulatory Visit | Attending: Orthopedic Surgery

## 2016-02-22 ENCOUNTER — Encounter (HOSPITAL_COMMUNITY): Payer: Self-pay | Admitting: Surgery

## 2016-02-22 ENCOUNTER — Ambulatory Visit (HOSPITAL_COMMUNITY): Payer: 59 | Admitting: Emergency Medicine

## 2016-02-22 DIAGNOSIS — S43492A Other sprain of left shoulder joint, initial encounter: Secondary | ICD-10-CM | POA: Diagnosis not present

## 2016-02-22 DIAGNOSIS — M24112 Other articular cartilage disorders, left shoulder: Secondary | ICD-10-CM | POA: Insufficient documentation

## 2016-02-22 DIAGNOSIS — C569 Malignant neoplasm of unspecified ovary: Secondary | ICD-10-CM | POA: Diagnosis not present

## 2016-02-22 DIAGNOSIS — M19012 Primary osteoarthritis, left shoulder: Secondary | ICD-10-CM | POA: Diagnosis not present

## 2016-02-22 DIAGNOSIS — Z79818 Long term (current) use of other agents affecting estrogen receptors and estrogen levels: Secondary | ICD-10-CM | POA: Insufficient documentation

## 2016-02-22 DIAGNOSIS — Z79810 Long term (current) use of selective estrogen receptor modulators (SERMs): Secondary | ICD-10-CM | POA: Insufficient documentation

## 2016-02-22 DIAGNOSIS — S43432A Superior glenoid labrum lesion of left shoulder, initial encounter: Secondary | ICD-10-CM | POA: Diagnosis not present

## 2016-02-22 DIAGNOSIS — K219 Gastro-esophageal reflux disease without esophagitis: Secondary | ICD-10-CM | POA: Diagnosis not present

## 2016-02-22 DIAGNOSIS — Z79899 Other long term (current) drug therapy: Secondary | ICD-10-CM | POA: Insufficient documentation

## 2016-02-22 DIAGNOSIS — N183 Chronic kidney disease, stage 3 (moderate): Secondary | ICD-10-CM | POA: Insufficient documentation

## 2016-02-22 DIAGNOSIS — G8918 Other acute postprocedural pain: Secondary | ICD-10-CM | POA: Diagnosis not present

## 2016-02-22 DIAGNOSIS — M7542 Impingement syndrome of left shoulder: Secondary | ICD-10-CM | POA: Diagnosis not present

## 2016-02-22 DIAGNOSIS — M75112 Incomplete rotator cuff tear or rupture of left shoulder, not specified as traumatic: Secondary | ICD-10-CM | POA: Insufficient documentation

## 2016-02-22 DIAGNOSIS — X58XXXA Exposure to other specified factors, initial encounter: Secondary | ICD-10-CM | POA: Diagnosis not present

## 2016-02-22 SURGERY — SHOULDER ARTHROSCOPY WITH SUBACROMIAL DECOMPRESSION AND DISTAL CLAVICLE EXCISION
Anesthesia: General | Laterality: Left

## 2016-02-22 MED ORDER — SODIUM CHLORIDE 0.9 % IR SOLN
Status: DC | PRN
  Administered 2016-02-22: 1000 mL
  Administered 2016-02-22 (×2): 3000 mL

## 2016-02-22 MED ORDER — LACTATED RINGERS IV SOLN
INTRAVENOUS | Status: DC
  Administered 2016-02-22 (×2): via INTRAVENOUS

## 2016-02-22 MED ORDER — MIDAZOLAM HCL 2 MG/2ML IJ SOLN
INTRAMUSCULAR | Status: AC
  Administered 2016-02-22: 2 mg
  Filled 2016-02-22: qty 2

## 2016-02-22 MED ORDER — FENTANYL CITRATE (PF) 100 MCG/2ML IJ SOLN
INTRAMUSCULAR | Status: AC
  Filled 2016-02-22: qty 2

## 2016-02-22 MED ORDER — ONDANSETRON HCL 4 MG/2ML IJ SOLN
INTRAMUSCULAR | Status: AC
  Filled 2016-02-22: qty 2

## 2016-02-22 MED ORDER — MIDAZOLAM HCL 2 MG/2ML IJ SOLN
INTRAMUSCULAR | Status: DC | PRN
  Administered 2016-02-22 (×2): 1 mg via INTRAVENOUS

## 2016-02-22 MED ORDER — FENTANYL CITRATE (PF) 100 MCG/2ML IJ SOLN
INTRAMUSCULAR | Status: AC
  Administered 2016-02-22: 50 ug
  Filled 2016-02-22: qty 2

## 2016-02-22 MED ORDER — KETOROLAC TROMETHAMINE 30 MG/ML IJ SOLN: 30.0000 mg | Freq: Once | INTRAMUSCULAR | Status: AC | PRN

## 2016-02-22 MED ORDER — SUCCINYLCHOLINE CHLORIDE 20 MG/ML IJ SOLN
INTRAMUSCULAR | Status: DC | PRN
  Administered 2016-02-22: 100 mg via INTRAVENOUS

## 2016-02-22 MED ORDER — ALBUTEROL SULFATE HFA 108 (90 BASE) MCG/ACT IN AERS
INHALATION_SPRAY | RESPIRATORY_TRACT | Status: DC | PRN
  Administered 2016-02-22: 4 via RESPIRATORY_TRACT

## 2016-02-22 MED ORDER — LIDOCAINE HCL (CARDIAC) 20 MG/ML IV SOLN
INTRAVENOUS | Status: DC | PRN
  Administered 2016-02-22: 60 mg via INTRATRACHEAL

## 2016-02-22 MED ORDER — SUCCINYLCHOLINE CHLORIDE 200 MG/10ML IV SOSY
PREFILLED_SYRINGE | INTRAVENOUS | Status: AC
  Filled 2016-02-22: qty 10

## 2016-02-22 MED ORDER — NAPROXEN 500 MG PO TABS
500.0000 mg | ORAL_TABLET | Freq: Two times a day (BID) | ORAL | 1 refills | Status: DC
Start: 2016-02-22 — End: 2016-06-10

## 2016-02-22 MED ORDER — PROMETHAZINE HCL 25 MG/ML IJ SOLN: 6.2500 mg | INTRAMUSCULAR | Status: DC | PRN

## 2016-02-22 MED ORDER — ONDANSETRON HCL 4 MG/2ML IJ SOLN
INTRAMUSCULAR | Status: DC | PRN
  Administered 2016-02-22: 4 mg via INTRAVENOUS

## 2016-02-22 MED ORDER — PROPOFOL 500 MG/50ML IV EMUL
INTRAVENOUS | Status: DC | PRN
  Administered 2016-02-22: 25 ug/kg/min via INTRAVENOUS

## 2016-02-22 MED ORDER — PROPOFOL 10 MG/ML IV BOLUS
INTRAVENOUS | Status: DC | PRN
  Administered 2016-02-22: 150 mg via INTRAVENOUS
  Administered 2016-02-22: 50 mg via INTRAVENOUS
  Administered 2016-02-22: 20 mg via INTRAVENOUS

## 2016-02-22 MED ORDER — PHENYLEPHRINE HCL 10 MG/ML IJ SOLN
INTRAVENOUS | Status: DC | PRN
  Administered 2016-02-22: 10 ug/min via INTRAVENOUS

## 2016-02-22 MED ORDER — GLYCOPYRROLATE 0.2 MG/ML IV SOSY
PREFILLED_SYRINGE | INTRAVENOUS | Status: AC
  Filled 2016-02-22: qty 3

## 2016-02-22 MED ORDER — OXYCODONE-ACETAMINOPHEN 5-325 MG PO TABS: 1.0000 | ORAL_TABLET | ORAL | 0 refills | Status: DC | PRN

## 2016-02-22 MED ORDER — LIDOCAINE 2% (20 MG/ML) 5 ML SYRINGE
INTRAMUSCULAR | Status: AC
  Filled 2016-02-22: qty 5

## 2016-02-22 MED ORDER — HYDROMORPHONE HCL 1 MG/ML IJ SOLN: 0.2500 mg | INTRAMUSCULAR | Status: DC | PRN

## 2016-02-22 MED ORDER — CHLORHEXIDINE GLUCONATE 4 % EX LIQD: 60.0000 mL | Freq: Once | CUTANEOUS | Status: DC

## 2016-02-22 MED ORDER — STERILE WATER FOR IRRIGATION IR SOLN
Status: DC | PRN
  Administered 2016-02-22: 1000 mL

## 2016-02-22 MED ORDER — KETAMINE HCL 10 MG/ML IJ SOLN
INTRAMUSCULAR | Status: DC | PRN
  Administered 2016-02-22: 20 mg via INTRAVENOUS

## 2016-02-22 MED ORDER — PROPOFOL 1000 MG/100ML IV EMUL
INTRAVENOUS | Status: AC
  Filled 2016-02-22: qty 100

## 2016-02-22 MED ORDER — PROPOFOL 10 MG/ML IV BOLUS
INTRAVENOUS | Status: AC
  Filled 2016-02-22: qty 20

## 2016-02-22 MED ORDER — METHOCARBAMOL 500 MG PO TABS: 500.0000 mg | ORAL_TABLET | Freq: Three times a day (TID) | ORAL | 1 refills | Status: DC | PRN

## 2016-02-22 MED ORDER — CEFAZOLIN SODIUM-DEXTROSE 2-4 GM/100ML-% IV SOLN
2.0000 g | INTRAVENOUS | Status: AC
  Administered 2016-02-22: 2 g via INTRAVENOUS
  Filled 2016-02-22: qty 100

## 2016-02-22 MED ORDER — KETAMINE HCL-SODIUM CHLORIDE 100-0.9 MG/10ML-% IV SOSY
PREFILLED_SYRINGE | INTRAVENOUS | Status: AC
  Filled 2016-02-22: qty 10

## 2016-02-22 MED ORDER — BUPIVACAINE HCL (PF) 0.5 % IJ SOLN
INTRAMUSCULAR | Status: DC | PRN
  Administered 2016-02-22: 30 mL via PERINEURAL

## 2016-02-22 MED ORDER — ARTIFICIAL TEARS OP OINT
TOPICAL_OINTMENT | OPHTHALMIC | Status: AC
  Filled 2016-02-22: qty 3.5

## 2016-02-22 MED ORDER — MIDAZOLAM HCL 2 MG/2ML IJ SOLN
INTRAMUSCULAR | Status: AC
  Filled 2016-02-22: qty 2

## 2016-02-22 MED ORDER — ARTIFICIAL TEARS OP OINT
TOPICAL_OINTMENT | OPHTHALMIC | Status: DC | PRN
  Administered 2016-02-22: 1 via OPHTHALMIC

## 2016-02-22 MED ORDER — FENTANYL CITRATE (PF) 100 MCG/2ML IJ SOLN
INTRAMUSCULAR | Status: DC | PRN
  Administered 2016-02-22: 100 ug via INTRAVENOUS

## 2016-02-22 MED ORDER — GLYCOPYRROLATE 0.2 MG/ML IJ SOLN
INTRAMUSCULAR | Status: DC | PRN
  Administered 2016-02-22: 0.2 mg via INTRAVENOUS

## 2016-02-22 MED ORDER — ONDANSETRON HCL 4 MG PO TABS: 4.0000 mg | ORAL_TABLET | Freq: Three times a day (TID) | ORAL | 0 refills | Status: DC | PRN

## 2016-02-22 MED ORDER — 0.9 % SODIUM CHLORIDE (POUR BTL) OPTIME
TOPICAL | Status: DC | PRN
  Administered 2016-02-22: 1000 mL

## 2016-02-22 SURGICAL SUPPLY — 59 items
BLADE CUTTER GATOR 3.5 (BLADE) ×2 IMPLANT
BLADE GREAT WHITE 4.2 (BLADE) ×2 IMPLANT
BLADE SURG 11 STRL SS (BLADE) ×2 IMPLANT
BOOTCOVER CLEANROOM LRG (PROTECTIVE WEAR) ×4 IMPLANT
BUR OVAL 4.0 (BURR) ×2 IMPLANT
CANISTER SUCT LVC 12 LTR MEDI- (MISCELLANEOUS) ×2 IMPLANT
CANNULA ACUFLEX KIT 5X76 (CANNULA) ×2 IMPLANT
CANNULA DRILOCK 5.0X75 (CANNULA) ×2 IMPLANT
CLSR STERI-STRIP ANTIMIC 1/2X4 (GAUZE/BANDAGES/DRESSINGS) ×1 IMPLANT
CONNECTOR 5 IN 1 STRAIGHT STRL (MISCELLANEOUS) ×2 IMPLANT
DRAPE INCISE 23X17 IOBAN STRL (DRAPES)
DRAPE INCISE 23X17 STRL (DRAPES) IMPLANT
DRAPE INCISE IOBAN 23X17 STRL (DRAPES) IMPLANT
DRAPE INCISE IOBAN 66X45 STRL (DRAPES) ×2 IMPLANT
DRAPE ORTHO SPLIT 77X108 STRL (DRAPES) ×4
DRAPE STERI 35X30 U-POUCH (DRAPES) IMPLANT
DRAPE SURG 17X11 SM STRL (DRAPES) ×2 IMPLANT
DRAPE SURG ORHT 6 SPLT 77X108 (DRAPES) ×2 IMPLANT
DRAPE U-SHAPE 47X51 STRL (DRAPES) IMPLANT
DRSG PAD ABDOMINAL 8X10 ST (GAUZE/BANDAGES/DRESSINGS) ×3 IMPLANT
DURAPREP 26ML APPLICATOR (WOUND CARE) ×2 IMPLANT
FLUID NSS /IRRIG 3000 ML XXX (IV SOLUTION) ×2 IMPLANT
GAUZE SPONGE 4X4 12PLY STRL (GAUZE/BANDAGES/DRESSINGS) ×2 IMPLANT
GLOVE BIO SURGEON STRL SZ7.5 (GLOVE) ×2 IMPLANT
GLOVE BIO SURGEON STRL SZ8 (GLOVE) ×2 IMPLANT
GLOVE EUDERMIC 7 POWDERFREE (GLOVE) ×2 IMPLANT
GLOVE SS BIOGEL STRL SZ 7.5 (GLOVE) ×1 IMPLANT
GLOVE SUPERSENSE BIOGEL SZ 7.5 (GLOVE) ×1
GOWN STRL REUS W/ TWL LRG LVL3 (GOWN DISPOSABLE) ×1 IMPLANT
GOWN STRL REUS W/ TWL XL LVL3 (GOWN DISPOSABLE) ×2 IMPLANT
GOWN STRL REUS W/TWL LRG LVL3 (GOWN DISPOSABLE) ×2
GOWN STRL REUS W/TWL XL LVL3 (GOWN DISPOSABLE) ×4
IV NS 1000ML (IV SOLUTION) ×2
IV NS 1000ML BAXH (IV SOLUTION) IMPLANT
KIT BASIN OR (CUSTOM PROCEDURE TRAY) ×2 IMPLANT
KIT ROOM TURNOVER OR (KITS) ×2 IMPLANT
KIT SHOULDER TRACTION (DRAPES) ×2 IMPLANT
MANIFOLD NEPTUNE II (INSTRUMENTS) ×2 IMPLANT
NDL SPNL 18GX3.5 QUINCKE PK (NEEDLE) ×1 IMPLANT
NEEDLE SPNL 18GX3.5 QUINCKE PK (NEEDLE) ×2 IMPLANT
NS IRRIG 1000ML POUR BTL (IV SOLUTION) ×2 IMPLANT
PACK SHOULDER (CUSTOM PROCEDURE TRAY) ×2 IMPLANT
PAD ARMBOARD 7.5X6 YLW CONV (MISCELLANEOUS) ×4 IMPLANT
SET ARTHROSCOPY TUBING (MISCELLANEOUS) ×2
SET ARTHROSCOPY TUBING LN (MISCELLANEOUS) ×1 IMPLANT
SLING ARM IMMOBILIZER LRG (SOFTGOODS) ×1 IMPLANT
SLING ARM LRG ADULT FOAM STRAP (SOFTGOODS) IMPLANT
SLING ARM MED ADULT FOAM STRAP (SOFTGOODS) ×2 IMPLANT
SPONGE GAUZE 4X4 12PLY STER LF (GAUZE/BANDAGES/DRESSINGS) ×1 IMPLANT
SPONGE LAP 4X18 X RAY DECT (DISPOSABLE) IMPLANT
STRIP CLOSURE SKIN 1/2X4 (GAUZE/BANDAGES/DRESSINGS) ×2 IMPLANT
SUT MNCRL AB 3-0 PS2 18 (SUTURE) ×2 IMPLANT
SUT PDS AB 0 CT 36 (SUTURE) ×1 IMPLANT
SYR 20CC LL (SYRINGE) IMPLANT
TAPE PAPER 3X10 WHT MICROPORE (GAUZE/BANDAGES/DRESSINGS) ×2 IMPLANT
TOWEL OR 17X24 6PK STRL BLUE (TOWEL DISPOSABLE) ×2 IMPLANT
TOWEL OR 17X26 10 PK STRL BLUE (TOWEL DISPOSABLE) ×2 IMPLANT
WAND SUCTION MAX 4MM 90S (SURGICAL WAND) ×2 IMPLANT
WATER STERILE IRR 1000ML POUR (IV SOLUTION) ×2 IMPLANT

## 2016-02-22 NOTE — H&P (Signed)
[name]    Chief Complaint: left shoulder impingement possible rotator cuff tear and OA HPI: The patient is a 60 y.o. 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  Past Medical History:  Diagnosis Date  . Anxiety   . Arthritis   . Cervical syndrome   . CKD (chronic kidney disease), stage III   . GERD (gastroesophageal reflux disease)   . Granulosa cell tumor of ovary ([place])   . History of hiatal hernia   . Obesity   . Ovarian cancer ([place])   . PONV (postoperative nausea and vomiting)    history of n/v,  past surgeries no n/v    Past Surgical History:  Procedure Laterality Date  . ABDOMINAL HYSTERECTOMY     TAH/BSO  . EXPLORATORY LAPAROTOMY     for bowel obstruction  . Secondary tumor debulking  2002  . Tumor debulking  2010  . VENTRAL HERNIA REPAIR      Family History  Problem Relation Age of Onset  . Stroke Father     Social History:  reports that she has never smoked. She has never used smokeless tobacco. She reports that she drinks alcohol. She reports that she does not use drugs.   Medications Prior to Admission  Medication Sig Dispense Refill  . escitalopram (LEXAPRO) 20 MG tablet Take 10 mg by mouth daily.     Marland Kitchen ibuprofen (ADVIL,MOTRIN) 200 MG tablet Take 600 mg by mouth every 6 (six) hours as needed for mild pain.    Marland Kitchen letrozole (FEMARA) 2.5 MG tablet TAKE 1 TABLET BY MOUTH ONCE DAILY 90 tablet 4  . methocarbamol (ROBAXIN) 500 MG tablet Take 500 mg by mouth at bedtime as needed for muscle spasms.     . naproxen sodium (ANAPROX) 550 MG tablet Take 550 mg by mouth 2 (two) times daily as needed for mild pain.     . nitrofurantoin (MACRODANTIN) 25 MG capsule Take 25 mg by mouth 4 (four) times daily.    Marland Kitchen omeprazole (PRILOSEC) 20 MG capsule Take 20 mg by mouth daily as needed (heartburn).     . Prenatal Vit-Fe Fumarate-FA (PRENATAL MULTIVITAMIN) TABS tablet Take 1 tablet by mouth daily  at 12 noon.    . Probiotic Product (ALIGN PO) Take 1 tablet by mouth daily.     . megestrol (MEGACE) 40 MG tablet Take 1 tablet (40 mg total) by mouth 3 (three) times daily. For two weeks then alternate with tamoxifen 90 tablet 6  . polyethylene glycol (MIRALAX / GLYCOLAX) packet Take 17 g by mouth daily as needed for mild constipation.    . tamoxifen (NOLVADEX) 20 MG tablet Take 1 tablet (20 mg total) by mouth daily. Take for two weeks then alternate with Megace 30 tablet 6     Physical Exam: left shoulder with painful and restricted motion as noted at recent office visits  Vitals  Pulse Rate:  [42-47] 42 (11/02 1305) Resp:  [12-20] 12 (11/02 1305) BP: (95-122)/(37-100) 95/55 (11/02 1305) SpO2:  [92 %-98 %] 92 % (11/02 1305) Weight:  [95.7 kg (211 lb)] 95.7 kg (211 lb) (11/02 1134)  Assessment/Plan  Impression: left shoulder impingement possible rotator cuff tear and OA  Plan of Action: Procedure(s): SHOULDER ARTHROSCOPY WITH SUBACROMIAL DECOMPRESSION AND DISTAL CLAVICLE EXCISION and possible rotator cuff repair  [name] [name] 02/22/2016, 1:15 PM Contact # [phone]

## 2016-02-22 NOTE — Op Note (Signed)
02/22/2016  3:36 PM  PATIENT:   [name]  60 y.o. female  PRE-OPERATIVE DIAGNOSIS:  left shoulder impingement possible rotator cuff tear and AC joint OA  POST-OPERATIVE DIAGNOSIS:  L shoulder impingement, AC joint OA, partial articular RCT, labral tear  PROCEDURE:  LSA, labral and rotator cuff debridement, SAD, DCR  SURGEON:  Taylia Berber, Metta Clines M.D.  ASSISTANTS: [place]ord pac   ANESTHESIA:   GET + ISB  EBL: min  SPECIMEN:  none  Drains: none   PATIENT DISPOSITION:  PACU - hemodynamically stable.    PLAN OF CARE: Discharge to home after PACU  Dictation# [id number]   Contact # [phone]

## 2016-02-22 NOTE — Transfer of Care (Signed)
Immediate Anesthesia Transfer of Care Note  Patient: [name]  Procedure(s) Performed: Procedure(s) with comments: SHOULDER ARTHROSCOPY WITH SUBACROMIAL DECOMPRESSION AND DISTAL CLAVICLE EXCISION and possible rotator cuff repair (Left) - Interscalene block  Patient Location: PACU  Anesthesia Type:GA combined with regional for post-op pain  Level of Consciousness: awake and alert   Airway & Oxygen Therapy: Patient Spontanous Breathing and Patient connected to face mask oxygen  Post-op Assessment: Report given to RN and Post -op Vital signs reviewed and stable  Post vital signs: Reviewed and stable  Last Vitals:  Vitals:   02/22/16 1305 02/22/16 1550  BP: (!) 95/55 (!) 125/56  Pulse: (!) 42 68  Resp: 12 (!) 23  Temp:  36.5 C    Last Pain: There were no vitals filed for this visit.    Patients Stated Pain Goal: 4 ([id number])  Complications: No apparent anesthesia complications

## 2016-02-22 NOTE — Anesthesia Procedure Notes (Addendum)
Anesthesia Regional Block:  Interscalene brachial plexus block  Pre-Anesthetic Checklist: ,, timeout performed, Correct Patient, Correct Site, Correct Laterality, Correct Procedure, Correct Position, site marked, Risks and benefits discussed,  Surgical consent,  Pre-op evaluation,  At surgeon's request and post-op pain management  Laterality: Left  Prep: chloraprep       Needles:  Injection technique: Single-shot  Needle Type: Echogenic Needle     Needle Length: 9cm 9 cm Needle Gauge: 21 G    Additional Needles:  Procedures: ultrasound guided (picture in chart) Interscalene brachial plexus block Narrative:  Start time: 02/22/2016 1:40 PM End time: 02/22/2016 1:43 PM Injection made incrementally with aspirations every 5 mL.  Performed by: Personally  Anesthesiologist: Nylan Nevel  Additional Notes: Patient tolerated the procedure well without complications

## 2016-02-22 NOTE — Anesthesia Procedure Notes (Signed)
Procedure Name: Intubation Date/Time: 02/22/2016 2:16 PM Performed by: Myrtie Soman Pre-anesthesia Checklist: Patient identified, Emergency Drugs available, Suction available and Patient being monitored Patient Re-evaluated:Patient Re-evaluated prior to inductionOxygen Delivery Method: Circle system utilized Preoxygenation: Pre-oxygenation with 100% oxygen Intubation Type: IV induction Ventilation: Nasal airway inserted- appropriate to patient size and Oral airway inserted - appropriate to patient size Laryngoscope Size: Mac, 3 and Glidescope (DLx1 with very limited oral opening) Grade View: Grade III Tube type: Oral Tube size: 7.0 mm Number of attempts: 2 (DLx1 with blade with poor view, DL with glidescope T4) Airway Equipment and Method: Stylet and Video-laryngoscopy Placement Confirmation: ETT inserted through vocal cords under direct vision,  positive ETCO2 and breath sounds checked- equal and bilateral Secured at: 24 cm Tube secured with: Tape Dental Injury: Teeth and Oropharynx as per pre-operative assessment  Difficulty Due To: Difficulty was unanticipated

## 2016-02-22 NOTE — Anesthesia Preprocedure Evaluation (Addendum)
Anesthesia Evaluation  Patient identified by MRN, date of birth, ID band Patient awake    Reviewed: Allergy & Precautions, NPO status , Patient's Chart, lab work & pertinent test results  History of Anesthesia Complications (+) PONV  Airway Mallampati: III  TM Distance: <3 FB Neck ROM: Full   Comment: Grade 3/4 with DL, glidescope used grade 1 view with 4 blade Dental no notable dental hx.    Pulmonary neg pulmonary ROS,    Pulmonary exam normal breath sounds clear to auscultation       Cardiovascular negative cardio ROS Normal cardiovascular exam Rhythm:Regular Rate:Normal     Neuro/Psych negative neurological ROS  negative psych ROS   GI/Hepatic Neg liver ROS, GERD  Medicated,  Endo/Other  negative endocrine ROS  Renal/GU Renal InsufficiencyRenal disease  negative genitourinary   Musculoskeletal negative musculoskeletal ROS (+)   Abdominal   Peds negative pediatric ROS (+)  Hematology negative hematology ROS (+)   Anesthesia Other Findings   Reproductive/Obstetrics negative OB ROS                            Anesthesia Physical Anesthesia Plan  ASA: II  Anesthesia Plan: General   Post-op Pain Management: GA combined w/ Regional for post-op pain   Induction: Intravenous  Airway Management Planned: Oral ETT  Additional Equipment:   Intra-op Plan:   Post-operative Plan: Extubation in OR  Informed Consent: I have reviewed the patients History and Physical, chart, labs and discussed the procedure including the risks, benefits and alternatives for the proposed anesthesia with the patient or authorized representative who has indicated his/her understanding and acceptance.   Dental advisory given  Plan Discussed with: CRNA and Surgeon  Anesthesia Plan Comments:         Anesthesia Quick Evaluation

## 2016-02-22 NOTE — Discharge Instructions (Signed)
° °  Metta Clines. Supple, M.D., F.A.A.O.S. Orthopaedic Surgery Specializing in Arthroscopic and Reconstructive Surgery of the Shoulder and Knee [phone] [address] [place], [place] [address] - Fax [phone]   POST-OP SHOULDER ARTHROSCOPY INSTRUCTIONS  1. Call the office at [phone] to schedule your first post-op appointment 7-10 days from the date of your surgery.  2. Leave the steri-strips in place over your incisions when performing dressing changes and showering. You may remove your dressings and begin showering 72 hours from surgery. You can expect drainage that is clear to bloody in nature that occasionally will soak through your dressings. If this occurs go ahead and perform a dressing change. The drainage should lessen daily and when there is no drainage from your incisions feel free to go without a dressing.  3. Wear your sling for comfort. You may come out of your sling for ad lib activity and even decide not to use the sling at all. If you find you are more comfortable in your sling, make sure you come out of your sling at least 3-4 times a day to do the exercises that are included below.  4. Range of motion to your elbow, wrist, and hand are encouraged 3-5 times daily. Exercise to your hand and fingers helps to reduce swelling you may experience.  5. Utilize ice to the shoulder 3-4 times minimum a day and additionally if you are experiencing pain.  6. You may drive when safely off narcotics and muscle relaxants.  7. If you had a block pre-operatively to provide post-op pain relief you may want to go ahead and begin utilizing your pain meds as your arm begins to wake up. Blocks can sometimes last up to 16-18 hours. If you are still pain-free prior to going to bed you may want to strongly consider taking a pain medication to avoid being awakened in the night with the onset of pain. A muscle relaxant is also provided for you should you experience muscle spasms. It  is recommended that if you are experiencing pain that your pain medication alone is not controlling, add the muscle relaxant along with the pain medication which can give additional pain relief. The first one to two days is generally the most severe of your pain and then should gradually decrease. As your pain lessens it is recommended that you decrease your use of the pain medications to an "as needed basis" only and to always comply with the recommended dosages of the pain medications.  8. Pain medications can produce constipation along with their use. If you experience this, the use of an over the counter stool softener or laxative daily is recommended.   9. For additional questions or concerns, please do not hesitate to call the office. If after hours there is an answering service to forward your concerns to the physician on call.   POST-OP EXERCISES  The pendulum exercises should be performed while bending at the waist as far over as possible thereby letting gravity do the work for you.  Range of Motion Exercises: Pendulum (circular)  Repeat 20 times. Do 3 sessions per day.     Range of Motion Exercises: Pendulum (side-to-side)  Repeat 20 times. Do 3 sessions per day.    Range of Motion Exercises (self-stretching activities):  Slide arm up wall with palm toward you, moving closer to the wall. Hold for 5 seconds.  Repeat 10 times. Do 3 sessions per day.

## 2016-02-23 DIAGNOSIS — R600 Localized edema: Secondary | ICD-10-CM | POA: Diagnosis not present

## 2016-02-23 DIAGNOSIS — M7542 Impingement syndrome of left shoulder: Secondary | ICD-10-CM | POA: Diagnosis not present

## 2016-02-23 DIAGNOSIS — M19012 Primary osteoarthritis, left shoulder: Secondary | ICD-10-CM | POA: Diagnosis not present

## 2016-02-23 NOTE — Op Note (Signed)
[name], [name]                  ACCOUNT NO.:  [id number]  MEDICAL RECORD NO.:  [id number]  LOCATION:  [facility]                         FACILITY:  [facility]  PHYSICIAN:  Metta Clines. Leyna Vanderkolk, M.D.  DATE OF BIRTH:  Mar 23, 1956  DATE OF PROCEDURE:  02/22/2016 DATE OF DISCHARGE:                              OPERATIVE REPORT   PREOPERATIVE DIAGNOSES: 1. Chronic left shoulder impingement syndrome. 2. 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 3. Partial versus full-thickness rotator cuff tear.  POSTOPERATIVE DIAGNOSES: 1. Chronic left shoulder impingement syndrome. 2. 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 3. Partial articular rotator cuff tear. 4. Degenerative labral tear.  PROCEDURES: 1. Left shoulder examination under anesthesia. 2. Left shoulder glenohumeral joint diagnostic arthroscopy. 3. Labral debridement. 4. Debridement of partial articular rotator cuff tear. 5. Arthroscopic subacromial decompression and bursectomy. 6. Arthroscopic distal clavicle resection.  SURGEON:  Metta Clines. Bhavya Eschete, M.D.  Terrence DupontOlivia Mackie A. Shuford, P.A.-C.  ANESTHESIA:  General endotracheal as well as interscalene block.  ESTIMATED BLOOD LOSS:  Minimal.  DRAINS:  None.  HISTORY:  [name] is a 60 year old female, who has had chronic and progressive increasing left shoulder pain with possible impingement sign.  MRI scan showing rotator cuff tendinosis, some edema of the rotator cuff, but no obvious discrete full-thickness tears.  Significant bony impingement is noted on my review as well as advanced AC joint degenerative changes.  Due to her ongoing pain and functional limitations and failure to respond to conservative management, she was brought to the operating room at this time for planned left shoulder arthroscopy as described below.  Preoperatively, I counseled Ms. Gebert regarding treatment options and potential risks versus benefits thereof.  Possible  surgical complications were reviewed including bleeding, infection, neurovascular injury, persistent pain, loss of motion, anesthetic complication and possible need for additional surgery.  She understands and accepts and agrees with our planned procedure.  PROCEDURE IN DETAIL:  After undergoing routine preop evaluation, the patient received prophylactic antibiotics.  An Interscalene block was established in the holding area by the Anesthesia Department.  Placed supine on the operating table, underwent smooth induction of a general endotracheal anesthesia.  Turned to the right lateral decubitus position on a beanbag and appropriately padded and protected.  Left shoulder examination under general anesthesia revealed full motion.  No instability patterns were noted.  Left arm was then suspended at 70 degrees of abduction with 10 pounds of traction.  Left shoulder girdle region was sterilely prepped and draped in standard fashion.  Time-out was called.  Posterior portal was established in the glenohumeral joint. Anterior portal was established under direct visualization.  The articular surfaces were all found to be in excellent condition.  No instability patterns were noted.  The capsular volume was within normal limits.  The rotator cuff was carefully inspected.  We did not identify a partial articular-sided tear of the distal supraspinatus just posterior to the rotator interval and biceps tendon.  This area was debrided with shaver back to healthy tissue and was approximately 1.5 cm wide, but appeared to progress no deeper than perhaps 40-50% of the thickness of the tendon.  We passed the tag suture of 0  PDS at this point.  There was degenerative tearing of the superior labrum consistent with a type 1 SLAP lesion.  This was debrided with shaver back to stable margin.  The biceps tendon, however, was normal caliber with no proximal or distal instability and stable biceps anchor.  The  remaining inspection of the glenohumeral joint showed no additional pathologies. Fluid and instruments were then removed.  The arm was dropped down to 30 degrees of abduction.  Arthroscope was introduced into the subacromial space of the posterior portal and the direct lateral portal was established in the subacromial space.  Abundant dense bursal tissue and multiple adhesions were encountered and these were all divided and excised from the shaver and Stryker wand.  The wand was then used to remove the periosteum from the undersurface of the anterior half of the acromion and a subacromial decompression was performed with a bur creating a type 1 morphology.  Portal closure was then established directly anterior to the distal clavicle and distal clavicle resection was performed with a bur.  Care was taken to confirm visualization of the entire circumference of the distal clavicle to ensure adequate removal of the bone.  We then completed the subacromial/subdeltoid bursectomy.  We carefully inspected and probed the rotator cuff particularly around the tag suture and found that the overall quality of tissue was excellent and did not find any defects to suggest degeneration in revision that would require repair in this area.  The tag suture was then removed.  The bursectomy was completed.  Hemostasis was obtained.  Fluid and instrument were removed.  The portals were closed with Monocryl and Steri-Strips.  A dry dressing taped at the left shoulder and left arm was placed in a sling.  The patient was awakened, extubated, and taken to the recovery room in stable condition.  Olivia Mackie Shuford, P.A.-C. was used as an Environmental consultant throughout this case, was essential for help with positioning the patient, positioning the extremity, management of the arthroscopic equipment, tissue manipulation, suture management, wound closure, and intraoperative decision making.     Metta Clines. Carden Teel,  M.D.     KMS/MEDQ  D:  02/22/2016  T:  02/23/2016  Job:  [id number]

## 2016-02-23 NOTE — Anesthesia Postprocedure Evaluation (Signed)
Anesthesia Post Note  Patient: [name]  Procedure(s) Performed: Procedure(s) (LRB): SHOULDER ARTHROSCOPY WITH SUBACROMIAL DECOMPRESSION AND DISTAL CLAVICLE EXCISION and possible rotator cuff repair (Left)  Patient location during evaluation: PACU Anesthesia Type: General and Regional Level of consciousness: awake and alert Pain management: pain level controlled Vital Signs Assessment: post-procedure vital signs reviewed and stable Respiratory status: spontaneous breathing, nonlabored ventilation, respiratory function stable and patient connected to nasal cannula oxygen Cardiovascular status: blood pressure returned to baseline and stable Postop Assessment: no signs of nausea or vomiting Anesthetic complications: no    Last Vitals:  Vitals:   02/22/16 1630 02/22/16 1648  BP:  (!) 103/37  Pulse: (!) 47 (!) 47  Resp: 16 18  Temp:      Last Pain:  Vitals:   02/22/16 1648  PainSc: 0-No pain                 Lalanya Rufener S

## 2016-02-29 DIAGNOSIS — N390 Urinary tract infection, site not specified: Secondary | ICD-10-CM | POA: Diagnosis not present

## 2016-03-06 DIAGNOSIS — Z01419 Encounter for gynecological examination (general) (routine) without abnormal findings: Secondary | ICD-10-CM | POA: Diagnosis not present

## 2016-03-06 DIAGNOSIS — Z6833 Body mass index (BMI) 33.0-33.9, adult: Secondary | ICD-10-CM | POA: Diagnosis not present

## 2016-03-11 ENCOUNTER — Ambulatory Visit: Payer: 59 | Admitting: Gynecology

## 2016-03-12 DIAGNOSIS — Z4789 Encounter for other orthopedic aftercare: Secondary | ICD-10-CM | POA: Diagnosis not present

## 2016-03-13 DIAGNOSIS — N390 Urinary tract infection, site not specified: Secondary | ICD-10-CM | POA: Diagnosis not present

## 2016-03-22 MED FILL — TAMOXIFEN 20 MG TABLET: 20 | 90 days supply | Qty: 90 | Fill #1

## 2016-03-22 MED FILL — MEGESTROL 40 MG TABLET: 40 | 90 days supply | Qty: 270 | Fill #1

## 2016-03-25 DIAGNOSIS — J0100 Acute maxillary sinusitis, unspecified: Secondary | ICD-10-CM | POA: Diagnosis not present

## 2016-03-25 DIAGNOSIS — R358 Other polyuria: Secondary | ICD-10-CM | POA: Diagnosis not present

## 2016-03-25 DIAGNOSIS — J209 Acute bronchitis, unspecified: Secondary | ICD-10-CM | POA: Diagnosis not present

## 2016-03-25 DIAGNOSIS — H6123 Impacted cerumen, bilateral: Secondary | ICD-10-CM | POA: Diagnosis not present

## 2016-03-26 DIAGNOSIS — N3020 Other chronic cystitis without hematuria: Secondary | ICD-10-CM | POA: Diagnosis not present

## 2016-03-26 DIAGNOSIS — M25512 Pain in left shoulder: Secondary | ICD-10-CM | POA: Diagnosis not present

## 2016-03-26 DIAGNOSIS — Z4789 Encounter for other orthopedic aftercare: Secondary | ICD-10-CM | POA: Diagnosis not present

## 2016-03-26 DIAGNOSIS — M6281 Muscle weakness (generalized): Secondary | ICD-10-CM | POA: Diagnosis not present

## 2016-03-27 NOTE — Addendum Note (Signed)
Addendum  created 03/27/16 1303 by Myrtie Soman, MD   Anesthesia Intra Blocks edited, Sign clinical note

## 2016-04-05 DIAGNOSIS — M6281 Muscle weakness (generalized): Secondary | ICD-10-CM | POA: Diagnosis not present

## 2016-04-05 DIAGNOSIS — M25512 Pain in left shoulder: Secondary | ICD-10-CM | POA: Diagnosis not present

## 2016-04-05 DIAGNOSIS — N3020 Other chronic cystitis without hematuria: Secondary | ICD-10-CM | POA: Diagnosis not present

## 2016-04-05 DIAGNOSIS — Z4789 Encounter for other orthopedic aftercare: Secondary | ICD-10-CM | POA: Diagnosis not present

## 2016-04-08 DIAGNOSIS — N3020 Other chronic cystitis without hematuria: Secondary | ICD-10-CM | POA: Diagnosis not present

## 2016-04-09 DIAGNOSIS — M6281 Muscle weakness (generalized): Secondary | ICD-10-CM | POA: Diagnosis not present

## 2016-04-09 DIAGNOSIS — M25512 Pain in left shoulder: Secondary | ICD-10-CM | POA: Diagnosis not present

## 2016-04-09 DIAGNOSIS — N3020 Other chronic cystitis without hematuria: Secondary | ICD-10-CM | POA: Diagnosis not present

## 2016-04-09 DIAGNOSIS — Z4789 Encounter for other orthopedic aftercare: Secondary | ICD-10-CM | POA: Diagnosis not present

## 2016-04-16 DIAGNOSIS — Z4789 Encounter for other orthopedic aftercare: Secondary | ICD-10-CM | POA: Diagnosis not present

## 2016-04-16 DIAGNOSIS — N3020 Other chronic cystitis without hematuria: Secondary | ICD-10-CM | POA: Diagnosis not present

## 2016-04-16 DIAGNOSIS — M25512 Pain in left shoulder: Secondary | ICD-10-CM | POA: Diagnosis not present

## 2016-04-16 DIAGNOSIS — M6281 Muscle weakness (generalized): Secondary | ICD-10-CM | POA: Diagnosis not present

## 2016-04-26 DIAGNOSIS — N3020 Other chronic cystitis without hematuria: Secondary | ICD-10-CM | POA: Diagnosis not present

## 2016-04-26 DIAGNOSIS — N281 Cyst of kidney, acquired: Secondary | ICD-10-CM | POA: Diagnosis not present

## 2016-04-29 ENCOUNTER — Ambulatory Visit: Payer: 59 | Admitting: Gynecology

## 2016-05-02 DIAGNOSIS — M6281 Muscle weakness (generalized): Secondary | ICD-10-CM | POA: Diagnosis not present

## 2016-05-02 DIAGNOSIS — M25512 Pain in left shoulder: Secondary | ICD-10-CM | POA: Diagnosis not present

## 2016-05-02 DIAGNOSIS — Z4789 Encounter for other orthopedic aftercare: Secondary | ICD-10-CM | POA: Diagnosis not present

## 2016-06-06 ENCOUNTER — Ambulatory Visit (HOSPITAL_COMMUNITY)
Admission: RE | Admit: 2016-06-06 | Discharge: 2016-06-06 | Disposition: A | Discharge status: Home / Self Care | Payer: 59 | Source: Ambulatory Visit | Attending: Gynecologic Oncology | Admitting: Gynecologic Oncology

## 2016-06-06 ENCOUNTER — Ambulatory Visit (HOSPITAL_COMMUNITY): Payer: 59

## 2016-06-06 DIAGNOSIS — Z90722 Acquired absence of ovaries, bilateral: Secondary | ICD-10-CM | POA: Insufficient documentation

## 2016-06-06 DIAGNOSIS — Z90710 Acquired absence of both cervix and uterus: Secondary | ICD-10-CM | POA: Diagnosis not present

## 2016-06-06 DIAGNOSIS — C569 Malignant neoplasm of unspecified ovary: Secondary | ICD-10-CM | POA: Diagnosis not present

## 2016-06-06 DIAGNOSIS — R935 Abnormal findings on diagnostic imaging of other abdominal regions, including retroperitoneum: Secondary | ICD-10-CM | POA: Insufficient documentation

## 2016-06-06 DIAGNOSIS — R590 Localized enlarged lymph nodes: Secondary | ICD-10-CM | POA: Diagnosis not present

## 2016-06-06 DIAGNOSIS — R932 Abnormal findings on diagnostic imaging of liver and biliary tract: Secondary | ICD-10-CM | POA: Diagnosis not present

## 2016-06-06 DIAGNOSIS — D3910 Neoplasm of uncertain behavior of unspecified ovary: Secondary | ICD-10-CM | POA: Diagnosis not present

## 2016-06-06 MED ORDER — GADOBENATE DIMEGLUMINE 529 MG/ML IV SOLN
20.0000 mL | Freq: Once | INTRAVENOUS | Status: AC | PRN
  Administered 2016-06-06: 20 mL via INTRAVENOUS

## 2016-06-07 ENCOUNTER — Other Ambulatory Visit: Payer: Self-pay

## 2016-06-10 ENCOUNTER — Ambulatory Visit: Payer: 59 | Attending: Gynecology | Admitting: Gynecology

## 2016-06-10 ENCOUNTER — Other Ambulatory Visit: Payer: Self-pay | Admitting: Gynecology

## 2016-06-10 ENCOUNTER — Other Ambulatory Visit: Payer: 59

## 2016-06-10 ENCOUNTER — Encounter: Payer: Self-pay | Admitting: Gynecology

## 2016-06-10 VITALS — BP 132/56 | HR 70 | Temp 98.1°F | Resp 18 | Ht 66.0 in | Wt 208.8 lb

## 2016-06-10 DIAGNOSIS — Z79818 Long term (current) use of other agents affecting estrogen receptors and estrogen levels: Secondary | ICD-10-CM

## 2016-06-10 DIAGNOSIS — Z6833 Body mass index (BMI) 33.0-33.9, adult: Secondary | ICD-10-CM | POA: Insufficient documentation

## 2016-06-10 DIAGNOSIS — Z881 Allergy status to other antibiotic agents status: Secondary | ICD-10-CM | POA: Insufficient documentation

## 2016-06-10 DIAGNOSIS — Z888 Allergy status to other drugs, medicaments and biological substances status: Secondary | ICD-10-CM | POA: Insufficient documentation

## 2016-06-10 DIAGNOSIS — N183 Chronic kidney disease, stage 3 (moderate): Secondary | ICD-10-CM | POA: Insufficient documentation

## 2016-06-10 DIAGNOSIS — Z823 Family history of stroke: Secondary | ICD-10-CM | POA: Diagnosis not present

## 2016-06-10 DIAGNOSIS — C569 Malignant neoplasm of unspecified ovary: Secondary | ICD-10-CM

## 2016-06-10 DIAGNOSIS — Z79810 Long term (current) use of selective estrogen receptor modulators (SERMs): Secondary | ICD-10-CM

## 2016-06-10 DIAGNOSIS — Z9889 Other specified postprocedural states: Secondary | ICD-10-CM | POA: Insufficient documentation

## 2016-06-10 DIAGNOSIS — Z882 Allergy status to sulfonamides status: Secondary | ICD-10-CM | POA: Insufficient documentation

## 2016-06-10 DIAGNOSIS — Z90710 Acquired absence of both cervix and uterus: Secondary | ICD-10-CM | POA: Diagnosis not present

## 2016-06-10 DIAGNOSIS — M1990 Unspecified osteoarthritis, unspecified site: Secondary | ICD-10-CM | POA: Diagnosis not present

## 2016-06-10 DIAGNOSIS — E669 Obesity, unspecified: Secondary | ICD-10-CM | POA: Diagnosis not present

## 2016-06-10 DIAGNOSIS — K219 Gastro-esophageal reflux disease without esophagitis: Secondary | ICD-10-CM | POA: Insufficient documentation

## 2016-06-10 DIAGNOSIS — Z90722 Acquired absence of ovaries, bilateral: Secondary | ICD-10-CM | POA: Diagnosis not present

## 2016-06-10 NOTE — Progress Notes (Signed)
Consult Note: Gyn-Onc   [name] 61 y.o. female  No chief complaint on file.   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 Given the evidence of stable disease we will continue her current regimen of tamoxifen alternating with Megace. She return in 3 months and will have another repeat MRI prior to that visit. Inhibin B and AMH will be obtained today.   . Interval History: The patient returns today as previously scheduled for followup. Since her last visit she's done well and continues taking letrozole.  She has had recurrent urinary tract infections over the summer although her most recent urine culture on 02/06/2016 was negative (the patient had recently just finished a course of antibiotics. Her symptoms are minimal although she does have some discomfort in the left lower quadrant which could be consistent with her peritoneal disease on the left side the pelvis.   3 months ago, the patient's regimen was changed based on progressive disease. She is now taking tamoxifen 20 mg for 2 weeks alternating with Megace 40 mg 3 times a day for 2 weeks. Recent MRI shows stable disease after 3 months of this regimen. (I have personally reviewed the MRI)   Overall, Her functional status is excellent. 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  HPI:The patient has a long-standing history of a granulosa cell tumor of the ovary dating back to 76. She had a recurrence in 2002 and the upper abdomen which was resected and she was subsequently treated with 6 cycles of intraperitoneal cisplatin and etoposide completed in June of 2002. She was followed between 2002 in 2010 with CT scans. In November 2010 CT scan showed increased size of nodules in the pelvis. At that time she underwent exploratory laparotomy with resection of tumor nodules near the cecum and left  pelvic sidewall.. The tumor was estrogen receptor negative and progesterone receptor positive. She alternated two-week courses of Megace and tamoxifen between June and December 2013. At that time CT scan showed progressive disease and the patient was switched to letrozole.  In October 2017 the patient was found to have progressive disease on MRI and her management regimen was changed to tamoxifen 20 mg daily for 2 weeks alternating with Megace 40 mg 3 times a day for 2 weeks.   Review of Systems:10 point review of systems is negative as noted above.   Vitals: There were no vitals taken for this visit.  Physical Exam: General : The patient is a healthy woman in no acute distress.  HEENT: normocephalic, extraoccular movements normal; neck is supple without thyromegally  Lynphnodes: Supraclavicular and inguinal nodes not enlarged  Abdomen: Soft, no ascites, no organomegally, no masses, no hernias .   Pelvic:  EGBUS: Normal female  Vagina: Normal, no lesions  Urethra and Bladder: Normal, non-tender  Cervix: Surgically absent  Uterus: Surgically absent  Bi-manual examination: Non-tender; no adenxal masses or nodularity  Rectal: normal sphincter tone, no masses, no blood   Lower extremities: No edema or varicosities. Normal range of motion       Allergies  Allergen Reactions  . Sodium Thiosalicylate     UNSPECIFIED REACTION   . Ciprofloxacin Other (See Comments)    FATIGUE  . Codone [Hydrocodone Bitartrate] Itching  . Stadol [Butorphanol Tartrate] Other (See Comments)    ALTERED MENTAL STATUS  . Sulfa Antibiotics Rash  Past Medical History:  Diagnosis Date  . Anxiety   . Arthritis   . Cervical syndrome   . CKD (chronic kidney disease), stage III   . GERD (gastroesophageal reflux disease)   . Granulosa cell tumor of ovary ([place])   . History of hiatal hernia   . Obesity   . Ovarian cancer ([place])   . PONV (postoperative nausea and vomiting)    history of n/v,  past  surgeries no n/v    Past Surgical History:  Procedure Laterality Date  . ABDOMINAL HYSTERECTOMY     TAH/BSO  . EXPLORATORY LAPAROTOMY     for bowel obstruction  . Secondary tumor debulking  2002  . Tumor debulking  2010  . VENTRAL HERNIA REPAIR      Current Outpatient Prescriptions  Medication Sig Dispense Refill  . escitalopram (LEXAPRO) 20 MG tablet Take 10 mg by mouth daily.     Marland Kitchen letrozole (FEMARA) 2.5 MG tablet TAKE 1 TABLET BY MOUTH ONCE DAILY 90 tablet 4  . megestrol (MEGACE) 40 MG tablet Take 1 tablet (40 mg total) by mouth 3 (three) times daily. For two weeks then alternate with tamoxifen 90 tablet 6  . methocarbamol (ROBAXIN) 500 MG tablet Take 1 tablet (500 mg total) by mouth every 8 (eight) hours as needed for muscle spasms. 40 tablet 1  . naproxen (NAPROSYN) 500 MG tablet Take 1 tablet (500 mg total) by mouth 2 (two) times daily with a meal. 60 tablet 1  . nitrofurantoin (MACRODANTIN) 25 MG capsule Take 25 mg by mouth 4 (four) times daily.    Marland Kitchen omeprazole (PRILOSEC) 20 MG capsule Take 20 mg by mouth daily as needed (heartburn).     . ondansetron (ZOFRAN) 4 MG tablet Take 1 tablet (4 mg total) by mouth every 8 (eight) hours as needed for nausea or vomiting. 20 tablet 0  . oxyCODONE-acetaminophen (PERCOCET) 5-325 MG tablet Take 1-2 tablets by mouth every 4 (four) hours as needed. 40 tablet 0  . polyethylene glycol (MIRALAX / GLYCOLAX) packet Take 17 g by mouth daily as needed for mild constipation.    . Prenatal Vit-Fe Fumarate-FA (PRENATAL MULTIVITAMIN) TABS tablet Take 1 tablet by mouth daily at 12 noon.    . Probiotic Product (ALIGN PO) Take 1 tablet by mouth daily.     . tamoxifen (NOLVADEX) 20 MG tablet Take 1 tablet (20 mg total) by mouth daily. Take for two weeks then alternate with Megace 30 tablet 6   No current facility-administered medications for this visit.     Social History   Social History  . Marital status: Married    Spouse name: N/A  . Number of  children: N/A  . Years of education: N/A   Occupational History  . Not on file.   Social History Main Topics  . Smoking status: Never Smoker  . Smokeless tobacco: Never Used  . Alcohol use Yes     Comment: One 8oz glass, socially  . Drug use: No  . Sexual activity: No   Other Topics Concern  . Not on file   Social History Narrative  . No narrative on file    Family History  Problem Relation Age of Onset  . Stroke Father       [name], MD 06/10/2016, 8:29 AM

## 2016-06-10 NOTE — Patient Instructions (Signed)
Labs for today: Inhibin B and Anti Mullerian Hormone. Please schedule 3 month follow up after MRI is completed.

## 2016-06-11 LAB — INHIBIN B: Inhibin B: 118.1 pg/mL — ABNORMAL HIGH (ref 0.0–16.9)

## 2016-06-13 LAB — ANTI MULLERIAN HORMONE: ANTI-MULLERIAN HORMONE (AMH): 2.68 ng/mL

## 2016-07-01 MED FILL — OMEPRAZOLE 20 MG CAPSULE DR: 20 | 90 days supply | Qty: 90 | Fill #0

## 2016-07-01 MED FILL — ESCITALOPRAM 20 MG TABLET: 20 | 90 days supply | Qty: 90 | Fill #0

## 2016-08-26 ENCOUNTER — Other Ambulatory Visit: Payer: Self-pay | Admitting: Gynecologic Oncology

## 2016-08-26 DIAGNOSIS — C569 Malignant neoplasm of unspecified ovary: Secondary | ICD-10-CM

## 2016-09-04 ENCOUNTER — Ambulatory Visit (HOSPITAL_COMMUNITY): Admission: RE | Admit: 2016-09-04 | Payer: 59 | Source: Ambulatory Visit

## 2016-09-04 ENCOUNTER — Telehealth: Payer: Self-pay | Admitting: *Deleted

## 2016-09-04 NOTE — Telephone Encounter (Signed)
Patient called and stated " they cancelled my MRI this morning. I need to reschedule my appt." Appt moved out to June 1st at 8am. Patient aware of new date/time

## 2016-09-06 ENCOUNTER — Ambulatory Visit: Payer: 59 | Admitting: Gynecology

## 2016-09-13 ENCOUNTER — Ambulatory Visit (HOSPITAL_COMMUNITY)
Admission: RE | Admit: 2016-09-13 | Discharge: 2016-09-13 | Disposition: A | Discharge status: Home / Self Care | Payer: 59 | Source: Ambulatory Visit | Attending: Gynecology | Admitting: Gynecology

## 2016-09-13 ENCOUNTER — Ambulatory Visit (HOSPITAL_COMMUNITY)
Admission: RE | Admit: 2016-09-13 | Discharge: 2016-09-13 | Disposition: A | Discharge status: Home / Self Care | Payer: 59 | Source: Ambulatory Visit | Attending: Gynecologic Oncology | Admitting: Gynecologic Oncology

## 2016-09-13 DIAGNOSIS — Z90710 Acquired absence of both cervix and uterus: Secondary | ICD-10-CM | POA: Insufficient documentation

## 2016-09-13 DIAGNOSIS — C569 Malignant neoplasm of unspecified ovary: Secondary | ICD-10-CM

## 2016-09-13 DIAGNOSIS — D3912 Neoplasm of uncertain behavior of left ovary: Secondary | ICD-10-CM | POA: Diagnosis not present

## 2016-09-13 DIAGNOSIS — D3911 Neoplasm of uncertain behavior of right ovary: Secondary | ICD-10-CM | POA: Diagnosis not present

## 2016-09-13 MED ORDER — GADOBENATE DIMEGLUMINE 529 MG/ML IV SOLN
20.0000 mL | Freq: Once | INTRAVENOUS | Status: AC | PRN
  Administered 2016-09-13: 20 mL via INTRAVENOUS

## 2016-09-20 ENCOUNTER — Encounter: Payer: Self-pay | Admitting: Gynecology

## 2016-09-20 ENCOUNTER — Ambulatory Visit: Payer: 59 | Attending: Gynecology | Admitting: Gynecology

## 2016-09-20 VITALS — BP 133/57 | HR 60 | Temp 97.7°F | Resp 20 | Wt 209.5 lb

## 2016-09-20 DIAGNOSIS — E669 Obesity, unspecified: Secondary | ICD-10-CM | POA: Insufficient documentation

## 2016-09-20 DIAGNOSIS — F419 Anxiety disorder, unspecified: Secondary | ICD-10-CM | POA: Insufficient documentation

## 2016-09-20 DIAGNOSIS — Z823 Family history of stroke: Secondary | ICD-10-CM | POA: Diagnosis not present

## 2016-09-20 DIAGNOSIS — Z79810 Long term (current) use of selective estrogen receptor modulators (SERMs): Secondary | ICD-10-CM

## 2016-09-20 DIAGNOSIS — N183 Chronic kidney disease, stage 3 (moderate): Secondary | ICD-10-CM | POA: Insufficient documentation

## 2016-09-20 DIAGNOSIS — K219 Gastro-esophageal reflux disease without esophagitis: Secondary | ICD-10-CM | POA: Diagnosis not present

## 2016-09-20 DIAGNOSIS — Z888 Allergy status to other drugs, medicaments and biological substances status: Secondary | ICD-10-CM | POA: Diagnosis not present

## 2016-09-20 DIAGNOSIS — Z86010 Personal history of colonic polyps: Secondary | ICD-10-CM | POA: Insufficient documentation

## 2016-09-20 DIAGNOSIS — Z9889 Other specified postprocedural states: Secondary | ICD-10-CM | POA: Diagnosis not present

## 2016-09-20 DIAGNOSIS — Z882 Allergy status to sulfonamides status: Secondary | ICD-10-CM | POA: Diagnosis not present

## 2016-09-20 DIAGNOSIS — Z90722 Acquired absence of ovaries, bilateral: Secondary | ICD-10-CM | POA: Diagnosis not present

## 2016-09-20 DIAGNOSIS — M1990 Unspecified osteoarthritis, unspecified site: Secondary | ICD-10-CM | POA: Diagnosis not present

## 2016-09-20 DIAGNOSIS — Z881 Allergy status to other antibiotic agents status: Secondary | ICD-10-CM | POA: Insufficient documentation

## 2016-09-20 DIAGNOSIS — C569 Malignant neoplasm of unspecified ovary: Secondary | ICD-10-CM | POA: Diagnosis not present

## 2016-09-20 DIAGNOSIS — Z79818 Long term (current) use of other agents affecting estrogen receptors and estrogen levels: Secondary | ICD-10-CM

## 2016-09-20 DIAGNOSIS — C786 Secondary malignant neoplasm of retroperitoneum and peritoneum: Secondary | ICD-10-CM

## 2016-09-20 DIAGNOSIS — Z6833 Body mass index (BMI) 33.0-33.9, adult: Secondary | ICD-10-CM | POA: Insufficient documentation

## 2016-09-20 DIAGNOSIS — Z90710 Acquired absence of both cervix and uterus: Secondary | ICD-10-CM | POA: Insufficient documentation

## 2016-09-20 NOTE — Patient Instructions (Signed)
We will place an order to have IR review your scan and perform a biopsy for ER/PR and [facility] One testing

## 2016-09-20 NOTE — Progress Notes (Signed)
Consult Note: Gyn-Onc   [name] 61 y.o. female  Chief Complaint  Patient presents with  . 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 Now with progressive disease over the past 3 months while the patient has been taking alternating tamoxifen and Megace  Plan:  I reviewed the patient's recent MRI   with her.  I pointed out the areas of involvement on her the liver as well as the pelvis. Treatment options were explored which might include restarting chemotherapy such as oral etoposide. In the end, we decided to proceed with obtaining a core biopsy of one of the lesions and submitted for [facility] genetic profiling as well as ER/PR status before making further decisions. . Interval History: The patient returns today as previously scheduled for followup. Since her last visit she's done well and continues taking alternating tamoxifen and Megace.unfortunately, follow-up MRI shows progressive disease in several areas of the liver capsule and pelvis. There are several nodules which remained stable also. Overall, the patient is entirely asymptomatic and her functional status is excellent. 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  HPI:The patient has a long-standing history of a granulosa cell tumor of the ovary dating back to 65. She had a recurrence in 2002 and the upper abdomen which was resected and she was subsequently treated with 6 cycles of intraperitoneal cisplatin and etoposide completed in June of 2002. She was followed between 2002 in 2010 with CT scans. In November 2010 CT scan showed increased size of nodules in the pelvis. At that time she underwent exploratory laparotomy with resection of tumor nodules near the cecum and left pelvic sidewall.. The tumor was estrogen receptor negative and progesterone receptor positive. She alternated two-week courses of Megace and tamoxifen between June and December 2013. At that time CT scan showed  progressive disease and the patient was switched to letrozole.  In October 2017 the patient was found to have progressive disease on MRI and her management regimen was changed to tamoxifen 20 mg daily for 2 weeks alternating with Megace 40 mg 3 times a day for 2 weeks.  Follow-up MRI in May 2018 showed progressive disease with peritoneal implants near the liver and pelvis.   Review of Systems:10 point review of systems is negative as noted above.   Vitals: Blood pressure (!) 133/57, pulse 60, temperature 97.7 F (36.5 C), resp. rate 20, weight 209 lb 8 oz (95 kg).  Physical Exam: General : The patient is a healthy woman in no acute distress.  HEENT: normocephalic, extraoccular movements normal; neck is supple without thyromegally  Lynphnodes: Supraclavicular and inguinal nodes not enlarged  Abdomen: Soft, no ascites, no organomegally, no masses, no hernias .   Pelvic:  EGBUS: Normal female  Vagina: Normal, no lesions  Urethra and Bladder: Normal, non-tender  Cervix: Surgically absent  Uterus: Surgically absent  Bi-manual examination: Non-tender; no adenxal masses or nodularity  Rectal: normal sphincter tone, no masses, no blood   Lower extremities: No edema or varicosities. Normal range of motion       Allergies  Allergen Reactions  . Sodium Thiosalicylate     UNSPECIFIED REACTION   . Ciprofloxacin Other (See Comments)    FATIGUE  . Codone [Hydrocodone Bitartrate] Itching  . Stadol [Butorphanol Tartrate] Other (See Comments)    ALTERED MENTAL STATUS  . Sulfa Antibiotics Rash    Past Medical History:  Diagnosis Date  . Anxiety   . Arthritis   .  Cervical syndrome   . CKD (chronic kidney disease), stage III   . GERD (gastroesophageal reflux disease)   . Granulosa cell tumor of ovary ([place])   . History of hiatal hernia   . Obesity   . Ovarian cancer ([place])   . PONV (postoperative nausea and vomiting)    history of n/v,  past surgeries no n/v    Past Surgical  History:  Procedure Laterality Date  . ABDOMINAL HYSTERECTOMY     TAH/BSO  . EXPLORATORY LAPAROTOMY     for bowel obstruction  . Secondary tumor debulking  2002  . Tumor debulking  2010  . VENTRAL HERNIA REPAIR      Current Outpatient Prescriptions  Medication Sig Dispense Refill  . escitalopram (LEXAPRO) 20 MG tablet Take 10 mg by mouth daily.     . megestrol (MEGACE) 40 MG tablet Take 1 tablet (40 mg total) by mouth 3 (three) times daily. For two weeks then alternate with tamoxifen 90 tablet 6  . omeprazole (PRILOSEC) 20 MG capsule Take 20 mg by mouth daily as needed (heartburn).     . polyethylene glycol (MIRALAX / GLYCOLAX) packet Take 17 g by mouth daily as needed for mild constipation.    . Prenatal Vit-Fe Fumarate-FA (PRENATAL MULTIVITAMIN) TABS tablet Take 1 tablet by mouth daily at 12 noon.    . Probiotic Product (ALIGN PO) Take 1 tablet by mouth daily.     . tamoxifen (NOLVADEX) 20 MG tablet Take 1 tablet (20 mg total) by mouth daily. Take for two weeks then alternate with Megace 30 tablet 6   No current facility-administered medications for this visit.     Social History   Social History  . Marital status: Married    Spouse name: N/A  . Number of children: N/A  . Years of education: N/A   Occupational History  . Not on file.   Social History Main Topics  . Smoking status: Never Smoker  . Smokeless tobacco: Never Used  . Alcohol use Yes     Comment: One 8oz glass, socially  . Drug use: No  . Sexual activity: No   Other Topics Concern  . Not on file   Social History Narrative  . No narrative on file    Family History  Problem Relation Age of Onset  . Stroke Father       [name], MD 09/20/2016, 8:30 AM

## 2016-09-27 MED FILL — ESCITALOPRAM 20 MG TABLET: 20 | 90 days supply | Qty: 90 | Fill #1

## 2016-10-03 ENCOUNTER — Other Ambulatory Visit: Payer: Self-pay | Admitting: Radiology

## 2016-10-04 ENCOUNTER — Other Ambulatory Visit (HOSPITAL_COMMUNITY)
Admission: RE | Admit: 2016-10-04 | Discharge: 2016-10-04 | Disposition: A | Discharge status: Home / Self Care | Payer: 59 | Source: Ambulatory Visit | Attending: Gynecology | Admitting: Gynecology

## 2016-10-04 ENCOUNTER — Ambulatory Visit (HOSPITAL_COMMUNITY)
Admission: RE | Admit: 2016-10-04 | Discharge: 2016-10-04 | Disposition: A | Discharge status: Home / Self Care | Payer: 59 | Source: Ambulatory Visit | Attending: Gynecologic Oncology | Admitting: Gynecologic Oncology

## 2016-10-04 ENCOUNTER — Encounter (HOSPITAL_COMMUNITY): Payer: Self-pay

## 2016-10-04 DIAGNOSIS — Z8543 Personal history of malignant neoplasm of ovary: Secondary | ICD-10-CM | POA: Insufficient documentation

## 2016-10-04 DIAGNOSIS — N183 Chronic kidney disease, stage 3 (moderate): Secondary | ICD-10-CM | POA: Insufficient documentation

## 2016-10-04 DIAGNOSIS — E669 Obesity, unspecified: Secondary | ICD-10-CM | POA: Insufficient documentation

## 2016-10-04 DIAGNOSIS — C787 Secondary malignant neoplasm of liver and intrahepatic bile duct: Secondary | ICD-10-CM | POA: Diagnosis not present

## 2016-10-04 DIAGNOSIS — Z9889 Other specified postprocedural states: Secondary | ICD-10-CM | POA: Insufficient documentation

## 2016-10-04 DIAGNOSIS — F419 Anxiety disorder, unspecified: Secondary | ICD-10-CM | POA: Diagnosis not present

## 2016-10-04 DIAGNOSIS — C569 Malignant neoplasm of unspecified ovary: Secondary | ICD-10-CM

## 2016-10-04 DIAGNOSIS — K219 Gastro-esophageal reflux disease without esophagitis: Secondary | ICD-10-CM | POA: Insufficient documentation

## 2016-10-04 DIAGNOSIS — K7689 Other specified diseases of liver: Secondary | ICD-10-CM | POA: Diagnosis not present

## 2016-10-04 LAB — CBC
HCT: 40.7 % (ref 36.0–46.0)
Hemoglobin: 14.4 g/dL (ref 12.0–15.0)
MCH: 31.6 pg (ref 26.0–34.0)
MCHC: 35.4 g/dL (ref 30.0–36.0)
MCV: 89.5 fL (ref 78.0–100.0)
PLATELETS: 268 10*3/uL (ref 150–400)
RBC: 4.55 MIL/uL (ref 3.87–5.11)
RDW: 11.9 % (ref 11.5–15.5)
WBC: 6.1 10*3/uL (ref 4.0–10.5)

## 2016-10-04 LAB — PROTIME-INR
INR: 1.06
PROTHROMBIN TIME: 13.9 s (ref 11.4–15.2)

## 2016-10-04 LAB — APTT: aPTT: 29 seconds (ref 24–36)

## 2016-10-04 MED ORDER — SODIUM CHLORIDE 0.9 % IV SOLN
INTRAVENOUS | Status: DC
  Administered 2016-10-04: 07:00:00 via INTRAVENOUS

## 2016-10-04 MED ORDER — FENTANYL CITRATE (PF) 100 MCG/2ML IJ SOLN
INTRAMUSCULAR | Status: AC
  Filled 2016-10-04: qty 4

## 2016-10-04 MED ORDER — FENTANYL CITRATE (PF) 100 MCG/2ML IJ SOLN
INTRAMUSCULAR | Status: AC | PRN
  Administered 2016-10-04 (×2): 25 ug via INTRAVENOUS

## 2016-10-04 MED ORDER — MIDAZOLAM HCL 5 MG/5ML IJ SOLN
INTRAMUSCULAR | Status: AC | PRN
  Administered 2016-10-04: 1 mg via INTRAVENOUS

## 2016-10-04 MED ORDER — MIDAZOLAM HCL 2 MG/2ML IJ SOLN
INTRAMUSCULAR | Status: AC | PRN
  Administered 2016-10-04 (×2): 1 mg via INTRAVENOUS

## 2016-10-04 MED ORDER — MIDAZOLAM HCL 2 MG/2ML IJ SOLN
INTRAMUSCULAR | Status: AC
  Filled 2016-10-04: qty 6

## 2016-10-04 NOTE — Procedures (Signed)
Pre procedural Dx: Malignant granulosa cell tumor of the ovary.  Post procedural Dx: Same  Technically successful [name] and CT guided aspiration and biopsy of dominant indeterminate cystic implant about the posterior aspect of the right lobe of the liver.   EBL: None.   Complications: None immediate.   [name] Bacon, MD Pager #: [phone]

## 2016-10-04 NOTE — H&P (Signed)
Referring Physician(s): Cross,Melissa D/Clarke-Pearson,D  Supervising Physician: Sandi Mariscal  Patient Status:  [facility] OP  Chief Complaint: "I'm here for a biopsy"   Subjective: Patient familiar to IR service from prior left pelvic cystic mass aspiration in 2000. She has a history of recurrent/progressive malignant granulosa cell tumor of the ovary. Recent MRI has revealed progression of peritoneal implant along the left external iliac chain, now measuring 1.8 x 2.4 cm, previously 1.1 x 1.4 cm.Additional serosal/ peritoneal disease in the abdomen/ pelvis  grossly unchanged, including a dominant 3.9 x 3.4 cm lesion along the right liver.She presents today for CT-guided right posterior perihepatic implant biopsy for further evaluation. She currently denies fever, headache, chest pain, dyspnea, cough, abdominal/back pain, nausea, vomiting or abnormal bleeding. Past Medical History:  Diagnosis Date  . Anxiety   . Arthritis   . Cervical syndrome   . CKD (chronic kidney disease), stage III   . GERD (gastroesophageal reflux disease)   . Granulosa cell tumor of ovary ([place])   . History of hiatal hernia   . Obesity   . Ovarian cancer ([place])   . PONV (postoperative nausea and vomiting)    history of n/v,  past surgeries no n/v   Past Surgical History:  Procedure Laterality Date  . ABDOMINAL HYSTERECTOMY     TAH/BSO  . EXPLORATORY LAPAROTOMY     for bowel obstruction  . Secondary tumor debulking  2002  . Tumor debulking  2010  . VENTRAL HERNIA REPAIR        Allergies: Sodium thiosalicylate; Ciprofloxacin; Codone [hydrocodone bitartrate]; Stadol [butorphanol tartrate]; and Sulfa antibiotics  Medications: Prior to Admission medications   Medication Sig Start Date End Date Taking? Authorizing Provider  escitalopram (LEXAPRO) 20 MG tablet Take 10 mg by mouth daily.    Yes [provider]  omeprazole (PRILOSEC) 20 MG capsule Take 20 mg by mouth daily as needed (heartburn).     Yes [provider]  polyethylene glycol (MIRALAX / GLYCOLAX) packet Take 17 g by mouth daily as needed for mild constipation.   Yes [provider]  Prenatal Vit-Fe Fumarate-FA (PRENATAL MULTIVITAMIN) TABS tablet Take 1 tablet by mouth daily at 12 noon.   Yes [provider]  Probiotic Product (ALIGN PO) Take 1 tablet by mouth daily.    Yes [provider]  megestrol (MEGACE) 40 MG tablet Take 1 tablet (40 mg total) by mouth 3 (three) times daily. For two weeks then alternate with tamoxifen 02/09/16   Joylene [name] D, NP  tamoxifen (NOLVADEX) 20 MG tablet Take 1 tablet (20 mg total) by mouth daily. Take for two weeks then alternate with Megace 02/09/16   Cross, Lenna Sciara D, NP     Vital Signs: BP 127/71 (BP Location: Right Arm)   Pulse (!) 59   Temp 98.1 F (36.7 C) (Oral)   Resp 18   SpO2 99%   Physical Exam Awake, alert. Chest clear to auscultation bilaterally.Heart with bradycardic rate, occ ectopy (PAC's with bigeminy); abdomen soft, positive bowel sounds, nontender. Lower extremities with no edema.  Imaging: No results found.  Labs:  CBC:  Recent Labs  02/13/16 0905 10/04/16 0713  WBC 4.7 6.1  HGB 14.3 14.4  HCT 41.2 40.7  PLT 258 268    COAGS:  Recent Labs  10/04/16 0713  INR 1.06  APTT 29    BMP:  Recent Labs  02/13/16 0905  NA 139  K 4.1  CL 104  CO2 26  GLUCOSE 100*  BUN 20  CALCIUM 9.6  CREATININE 1.15*  GFRNONAA 51*  GFRAA 59*    LIVER FUNCTION TESTS: No results for input(s): BILITOT, AST, ALT, ALKPHOS, PROT, ALBUMIN in the last 8760 hours.  Assessment and Plan: history of recurrent/progressive malignant granulosa cell tumor of the ovary. Recent MRI has revealed progression of peritoneal implant along the left external iliac chain, now measuring 1.8 x 2.4 cm, previously 1.1 x 1.4 cm.Additional serosal/ peritoneal disease in the abdomen/ pelvis  grossly unchanged, including a dominant 3.9 x 3.4 cm lesion  along the right liver.She presents today for CT-guided right posterior perihepatic implant biopsy for further evaluation. Risks and benefits discussed with the patient/spouse including, but not limited to bleeding, infection, damage to adjacent structures or low yield requiring additional tests.All of the patient's questions were answered, patient is agreeable to proceed.Consent signed and in chart.     Electronically Signed: D. Rowe Robert, PA-C 10/04/2016, 8:25 AM   I spent a total of 20 minutes  at the the patient's bedside AND on the patient's hospital floor or unit, greater than 50% of which was counseling/coordinating care for CT-guided perihepatic soft tissue implant biopsy.

## 2016-10-04 NOTE — Discharge Instructions (Signed)
Moderate Conscious Sedation, Adult, Care After °These instructions provide you with information about caring for yourself after your procedure. Your health care provider may also give you more specific instructions. Your treatment has been planned according to current medical practices, but problems sometimes occur. Call your health care provider if you have any problems or questions after your procedure. °What can I expect after the procedure? °After your procedure, it is common: °· To feel sleepy for several hours. °· To feel clumsy and have poor balance for several hours. °· To have poor judgment for several hours. °· To vomit if you eat too soon. ° °Follow these instructions at home: °For at least 24 hours after the procedure: ° °· Do not: °? Participate in activities where you could fall or become injured. °? Drive. °? Use heavy machinery. °? Drink alcohol. °? Take sleeping pills or medicines that cause drowsiness. °? Make important decisions or sign legal documents. °? Take care of children on your own. °· Rest. °Eating and drinking °· Follow the diet recommended by your health care provider. °· If you vomit: °? Drink water, juice, or soup when you can drink without vomiting. °? Make sure you have little or no nausea before eating solid foods. °General instructions °· Have a responsible adult stay with you until you are awake and alert. °· Take over-the-counter and prescription medicines only as told by your health care provider. °· If you smoke, do not smoke without supervision. °· Keep all follow-up visits as told by your health care provider. This is important. °Contact a health care provider if: °· You keep feeling nauseous or you keep vomiting. °· You feel light-headed. °· You develop a rash. °· You have a fever. °Get help right away if: °· You have trouble breathing. °This information is not intended to replace advice given to you by your health care provider. Make sure you discuss any questions you have  with your health care provider. °Document Released: 01/27/2013 Document Revised: 09/11/2015 Document Reviewed: 07/29/2015 °Elsevier Interactive Patient Education © 2018 [employer] ° ° °Needle Biopsy, Care After °These instructions give you information about caring for yourself after your procedure. Your doctor may also give you more specific instructions. Call your doctor if you have any problems or questions after your procedure. °Follow these instructions at home: °· Rest as told by your doctor. °· Take medicines only as told by your doctor. °· There are many different ways to close and cover the biopsy site, including stitches (sutures), skin glue, and adhesive strips. Follow instructions from your doctor about: °? How to take care of your biopsy site. °? When and how you should change your bandage (dressing). °? When you should remove your dressing. °? Removing whatever was used to close your biopsy site. °· Check your biopsy site every day for signs of infection. Watch for: °? Redness, swelling, or pain. °? Fluid, blood, or pus. °Contact a doctor if: °· You have a fever. °· You have redness, swelling, or pain at the biopsy site, and it lasts longer than a few days. °· You have fluid, blood, or pus coming from the biopsy site. °· You feel sick to your stomach (nauseous). °· You throw up (vomit). °Get help right away if: °· You are short of breath. °· You have trouble breathing. °· Your chest hurts. °· You feel dizzy or you pass out (faint). °· You have bleeding that does not stop with pressure or a bandage. °· You cough up blood. °·   Your belly (abdomen) hurts. °This information is not intended to replace advice given to you by your health care provider. Make sure you discuss any questions you have with your health care provider. °Document Released: 03/21/2008 Document Revised: 09/14/2015 Document Reviewed: 04/04/2014 °Elsevier Interactive Patient Education © 2018 [employer] ° °

## 2016-11-06 MED FILL — MEGESTROL 40 MG TABLET: 40 | 90 days supply | Qty: 90 | Fill #2

## 2016-11-06 MED FILL — TAMOXIFEN 20 MG TABLET: 20 | 90 days supply | Qty: 90 | Fill #2

## 2016-11-13 ENCOUNTER — Telehealth: Payer: Self-pay | Admitting: Gynecologic Oncology

## 2016-11-13 NOTE — Telephone Encounter (Signed)
Left message asking patient to please call the office.  Called to inform her that [facility] has stated they did not have enough tissue for anaylsis.

## 2016-11-14 ENCOUNTER — Telehealth: Payer: Self-pay | Admitting: Gynecologic Oncology

## 2016-11-14 NOTE — Telephone Encounter (Signed)
Attempted to call and inform patient that Dr. C-P agrees with her plan to continue with the Megace/Tamoxifen with reimaging in three months.

## 2016-11-14 NOTE — Telephone Encounter (Signed)
Spoke with patient about [facility] testing and how they did not have enough tissue to analyze.  Patient would not like to attempt another biopsy.  She would like to continue the Megace/Tamoxifen for another three months since she had only taken three mths before her most recent scan.  Advised her that I would reach out to Dr. C-P for his recommendations and would reach out to her today.

## 2016-11-20 DIAGNOSIS — M25562 Pain in left knee: Secondary | ICD-10-CM | POA: Diagnosis not present

## 2016-11-20 DIAGNOSIS — M1712 Unilateral primary osteoarthritis, left knee: Secondary | ICD-10-CM | POA: Diagnosis not present

## 2016-11-29 ENCOUNTER — Other Ambulatory Visit: Payer: Self-pay | Admitting: Gynecologic Oncology

## 2016-11-29 DIAGNOSIS — C569 Malignant neoplasm of unspecified ovary: Secondary | ICD-10-CM

## 2016-12-10 MED FILL — OMEPRAZOLE 20 MG CAP: 20 | 90 days supply | Qty: 90 | Fill #1

## 2016-12-25 ENCOUNTER — Ambulatory Visit (HOSPITAL_COMMUNITY)
Admission: RE | Admit: 2016-12-25 | Discharge: 2016-12-25 | Disposition: A | Discharge status: Home / Self Care | Payer: 59 | Source: Ambulatory Visit | Attending: Gynecologic Oncology | Admitting: Gynecologic Oncology

## 2016-12-25 DIAGNOSIS — C569 Malignant neoplasm of unspecified ovary: Secondary | ICD-10-CM

## 2016-12-25 DIAGNOSIS — C787 Secondary malignant neoplasm of liver and intrahepatic bile duct: Secondary | ICD-10-CM | POA: Diagnosis not present

## 2016-12-25 DIAGNOSIS — C786 Secondary malignant neoplasm of retroperitoneum and peritoneum: Secondary | ICD-10-CM | POA: Insufficient documentation

## 2016-12-25 DIAGNOSIS — C7889 Secondary malignant neoplasm of other digestive organs: Secondary | ICD-10-CM | POA: Diagnosis not present

## 2016-12-25 LAB — POCT I-STAT CREATININE: Creatinine, Ser: 1.2 mg/dL — ABNORMAL HIGH (ref 0.44–1.00)

## 2016-12-25 MED ORDER — GADOBENATE DIMEGLUMINE 529 MG/ML IV SOLN
20.0000 mL | Freq: Once | INTRAVENOUS | Status: AC | PRN
  Administered 2016-12-25: 20 mL via INTRAVENOUS

## 2016-12-26 ENCOUNTER — Telehealth: Payer: Self-pay | Admitting: *Deleted

## 2016-12-26 NOTE — Telephone Encounter (Signed)
Returned the patient's call and scheduled appt for October 8th at 8am

## 2016-12-30 MED FILL — ESCITALOPRAM 20 MG TABLET: 20 | 90 days supply | Qty: 90 | Fill #2

## 2017-01-01 ENCOUNTER — Telehealth: Payer: 59 | Admitting: Family

## 2017-01-01 DIAGNOSIS — R05 Cough: Secondary | ICD-10-CM | POA: Diagnosis not present

## 2017-01-01 DIAGNOSIS — J069 Acute upper respiratory infection, unspecified: Secondary | ICD-10-CM | POA: Diagnosis not present

## 2017-01-01 DIAGNOSIS — R059 Cough, unspecified: Secondary | ICD-10-CM

## 2017-01-01 MED ORDER — BENZONATATE 100 MG PO CAPS: 100.0000 mg | ORAL_CAPSULE | Freq: Three times a day (TID) | ORAL | 0 refills | Status: DC | PRN

## 2017-01-01 NOTE — Progress Notes (Signed)

## 2017-01-04 DIAGNOSIS — J209 Acute bronchitis, unspecified: Secondary | ICD-10-CM | POA: Diagnosis not present

## 2017-01-07 MED ORDER — GADOBENATE DIMEGLUMINE 529 MG/ML IV SOLN
20.0000 mL | Freq: Once | INTRAVENOUS | Status: AC | PRN
  Administered 2017-01-07: 20 mL via INTRAVENOUS

## 2017-01-17 DIAGNOSIS — H2513 Age-related nuclear cataract, bilateral: Secondary | ICD-10-CM | POA: Diagnosis not present

## 2017-01-17 DIAGNOSIS — H3509 Other intraretinal microvascular abnormalities: Secondary | ICD-10-CM | POA: Diagnosis not present

## 2017-01-17 DIAGNOSIS — H524 Presbyopia: Secondary | ICD-10-CM | POA: Diagnosis not present

## 2017-01-17 DIAGNOSIS — H25013 Cortical age-related cataract, bilateral: Secondary | ICD-10-CM | POA: Diagnosis not present

## 2017-01-27 ENCOUNTER — Encounter: Payer: Self-pay | Admitting: Gynecology

## 2017-01-27 ENCOUNTER — Ambulatory Visit: Payer: 59 | Attending: Gynecology | Admitting: Gynecology

## 2017-01-27 VITALS — BP 134/85 | HR 52 | Temp 98.1°F | Resp 20

## 2017-01-27 DIAGNOSIS — M531 Cervicobrachial syndrome: Secondary | ICD-10-CM | POA: Diagnosis not present

## 2017-01-27 DIAGNOSIS — Z823 Family history of stroke: Secondary | ICD-10-CM | POA: Diagnosis not present

## 2017-01-27 DIAGNOSIS — Z9221 Personal history of antineoplastic chemotherapy: Secondary | ICD-10-CM | POA: Diagnosis not present

## 2017-01-27 DIAGNOSIS — E669 Obesity, unspecified: Secondary | ICD-10-CM | POA: Insufficient documentation

## 2017-01-27 DIAGNOSIS — Z8543 Personal history of malignant neoplasm of ovary: Secondary | ICD-10-CM

## 2017-01-27 DIAGNOSIS — K219 Gastro-esophageal reflux disease without esophagitis: Secondary | ICD-10-CM | POA: Insufficient documentation

## 2017-01-27 DIAGNOSIS — Z885 Allergy status to narcotic agent status: Secondary | ICD-10-CM | POA: Diagnosis not present

## 2017-01-27 DIAGNOSIS — Z9889 Other specified postprocedural states: Secondary | ICD-10-CM | POA: Diagnosis not present

## 2017-01-27 DIAGNOSIS — F419 Anxiety disorder, unspecified: Secondary | ICD-10-CM | POA: Diagnosis not present

## 2017-01-27 DIAGNOSIS — Z888 Allergy status to other drugs, medicaments and biological substances status: Secondary | ICD-10-CM | POA: Insufficient documentation

## 2017-01-27 DIAGNOSIS — Z90710 Acquired absence of both cervix and uterus: Secondary | ICD-10-CM | POA: Insufficient documentation

## 2017-01-27 DIAGNOSIS — Z79899 Other long term (current) drug therapy: Secondary | ICD-10-CM | POA: Insufficient documentation

## 2017-01-27 DIAGNOSIS — C569 Malignant neoplasm of unspecified ovary: Secondary | ICD-10-CM | POA: Insufficient documentation

## 2017-01-27 DIAGNOSIS — N183 Chronic kidney disease, stage 3 (moderate): Secondary | ICD-10-CM | POA: Insufficient documentation

## 2017-01-27 DIAGNOSIS — R871 Abnormal level of hormones in specimens from female genital organs: Secondary | ICD-10-CM | POA: Diagnosis not present

## 2017-01-27 DIAGNOSIS — C786 Secondary malignant neoplasm of retroperitoneum and peritoneum: Secondary | ICD-10-CM | POA: Diagnosis not present

## 2017-01-27 DIAGNOSIS — M1990 Unspecified osteoarthritis, unspecified site: Secondary | ICD-10-CM | POA: Diagnosis not present

## 2017-01-27 DIAGNOSIS — Z882 Allergy status to sulfonamides status: Secondary | ICD-10-CM | POA: Insufficient documentation

## 2017-01-28 ENCOUNTER — Ambulatory Visit: Payer: 59

## 2017-01-28 DIAGNOSIS — C569 Malignant neoplasm of unspecified ovary: Secondary | ICD-10-CM

## 2017-01-30 LAB — INHIBIN B: INHIBIN B: 198 pg/mL — AB (ref 0.0–16.9)

## 2017-01-31 LAB — ANTI MULLERIAN HORMONE: ANTI-MULLERIAN HORMONE (AMH): 2.47 ng/mL

## 2017-02-04 NOTE — Progress Notes (Signed)
Consult Note: Gyn-Onc   [name] 61 y.o. female  Chief Complaint  Patient presents with  . 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 Now with progressive disease over the past 6 months while the patient has been taking alternating tamoxifen and Megace  Plan:  I reviewed the patient's recent MRI and rising Inhibin B (198)  with her.  I pointed out the areas of involvement on her liver as well as the pelvis. I reviewed treatment options with the patient.  She is strongly desirous to avoid cytotoxic chemotherapy.  Therefore, we will start treatment with Lupron 11.5 mg q 3 months. and repeat markers in 3 months.  Side effects were discussed.  We will ask her primary gynecologist to order baseline bone density studies. RTC 3 months  Interval History: The patient returns today as previously scheduled for followup. Since her last visit she's done well and continues taking alternating tamoxifen and Megace.unfortunately, follow-up MRI shows progressive disease in several areas of the liver capsule and pelvis. There are several nodules which remained stable also. Overall, the patient is entirely asymptomatic and her functional status is excellent. 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 At her last visit we requested a core biopsy for assessment of ER/PR and [facility] testing.   Marland KitchenUnfortunately, there was not enough tissue for [facility] testing.  ER was 50% and PR 90%. Review of the literature seeking other treatments revealed possible alternative treatments including: Lupron Bevacizamab Letrozole 2.5 mg plus Metformin 500 mg Carbo/Taxol Everolimus plus exemestane   HPI:The patient has a long-standing history of a granulosa cell tumor of the ovary dating back to 1994. She had a recurrence in 2002 and the upper abdomen which was resected and she was subsequently treated with 6 cycles of  intraperitoneal cisplatin and etoposide completed in June of 2002. She was followed between 2002 in 2010 with CT scans. In November 2010 CT scan showed increased size of nodules in the pelvis. At that time she underwent exploratory laparotomy with resection of tumor nodules near the cecum and left pelvic sidewall.. The tumor was estrogen receptor negative and progesterone receptor positive. She alternated two-week courses of Megace and tamoxifen between June and December 2013. At that time CT scan showed progressive disease and the patient was switched to letrozole.  In October 2017 the patient was found to have progressive disease on MRI and her management regimen was changed to tamoxifen 20 mg daily for 2 weeks alternating with Megace 40 mg 3 times a day for 2 weeks.  Follow-up MRI in May 2018 showed progressive disease with peritoneal implants near the liver and pelvis. In October 2018, the treatment was changed to Lupron 11.5 mg q 3 months.   Review of Systems:10 point review of systems is negative as noted above.   Vitals: Blood pressure 134/85, pulse (!) 52, temperature 98.1 F (36.7 C), temperature source Oral, resp. rate 20, SpO2 100 %.  Physical Exam: General : The patient is a healthy woman in no acute distress.  HEENT: normocephalic, extraoccular movements normal; neck is supple without thyromegally  Lynphnodes: Supraclavicular and inguinal nodes not enlarged  Abdomen: Soft, no ascites, no organomegally, no masses, no hernias .   Pelvic:  EGBUS: Normal female  Vagina: Normal, no lesions  Urethra and Bladder: Normal, non-tender  Cervix: Surgically absent  Uterus: Surgically absent  Bi-manual examination: Non-tender; no adenxal masses or nodularity  Rectal:  normal sphincter tone, no masses, no blood   Lower extremities: No edema or varicosities. Normal range of motion       Allergies  Allergen Reactions  . Sodium Thiosalicylate     UNSPECIFIED REACTION   . Ciprofloxacin  Other (See Comments)    FATIGUE  . Codone [Hydrocodone Bitartrate] Itching  . Stadol [Butorphanol Tartrate] Other (See Comments)    ALTERED MENTAL STATUS  . Sulfa Antibiotics Rash    Past Medical History:  Diagnosis Date  . Anxiety   . Arthritis   . Cervical syndrome   . CKD (chronic kidney disease), stage III ([place])   . GERD (gastroesophageal reflux disease)   . Granulosa cell tumor of ovary   . History of hiatal hernia   . Obesity   . Ovarian cancer ([place])   . PONV (postoperative nausea and vomiting)    history of n/v,  past surgeries no n/v    Past Surgical History:  Procedure Laterality Date  . ABDOMINAL HYSTERECTOMY     TAH/BSO  . EXPLORATORY LAPAROTOMY     for bowel obstruction  . Secondary tumor debulking  2002  . Tumor debulking  2010  . VENTRAL HERNIA REPAIR      Current Outpatient Prescriptions  Medication Sig Dispense Refill  . escitalopram (LEXAPRO) 20 MG tablet Take 10 mg by mouth daily.     Marland Kitchen omeprazole (PRILOSEC) 20 MG capsule Take 20 mg by mouth daily as needed (heartburn).     . polyethylene glycol (MIRALAX / GLYCOLAX) packet Take 17 g by mouth daily as needed for mild constipation.    . Prenatal Vit-Fe Fumarate-FA (PRENATAL MULTIVITAMIN) TABS tablet Take 1 tablet by mouth daily at 12 noon.    . Probiotic Product (ALIGN PO) Take 1 tablet by mouth daily.      No current facility-administered medications for this visit.     Social History   Social History  . Marital status: Married    Spouse name: N/A  . Number of children: N/A  . Years of education: N/A   Occupational History  . Not on file.   Social History Main Topics  . Smoking status: Never Smoker  . Smokeless tobacco: Never Used  . Alcohol use Yes     Comment: One 8oz glass, socially  . Drug use: No  . Sexual activity: No   Other Topics Concern  . Not on file   Social History Narrative  . No narrative on file    Family History  Problem Relation Age of Onset  . Stroke Father        [name], MD 02/04/2017, 10:34 AM

## 2017-02-04 NOTE — Patient Instructions (Signed)
We will begin Lupron 11.5 mg every 3 months.  Please have Dr. Nori Riis arrange bone density study.

## 2017-02-05 ENCOUNTER — Ambulatory Visit (HOSPITAL_BASED_OUTPATIENT_CLINIC_OR_DEPARTMENT_OTHER): Payer: 59

## 2017-02-05 ENCOUNTER — Other Ambulatory Visit: Payer: Self-pay | Admitting: Gynecologic Oncology

## 2017-02-05 DIAGNOSIS — C786 Secondary malignant neoplasm of retroperitoneum and peritoneum: Secondary | ICD-10-CM | POA: Diagnosis not present

## 2017-02-05 DIAGNOSIS — C569 Malignant neoplasm of unspecified ovary: Secondary | ICD-10-CM

## 2017-02-05 MED ORDER — LEUPROLIDE ACETATE (3 MONTH) 11.25 MG IM KIT
11.2500 mg | PACK | INTRAMUSCULAR | Status: DC
  Administered 2017-02-05: 11.25 mg via INTRAMUSCULAR

## 2017-02-05 NOTE — Patient Instructions (Signed)
Leuprolide depot injection What is this medicine? LEUPROLIDE (loo PROE lide) is a man-made protein that acts like a natural hormone in the body. It decreases testosterone in men and decreases estrogen in women. In men, this medicine is used to treat advanced prostate cancer. In women, some forms of this medicine may be used to treat endometriosis, uterine fibroids, or other female hormone-related problems. This medicine may be used for other purposes; ask your health care provider or pharmacist if you have questions. COMMON BRAND NAME(S): Eligard, Lupron Depot, Lupron Depot-Ped, Viadur What should I tell my health care provider before I take this medicine? They need to know if you have any of these conditions: -diabetes -heart disease or previous heart attack -high blood pressure -high cholesterol -mental illness -osteoporosis -pain or difficulty passing urine -seizures -spinal cord metastasis -stroke -suicidal thoughts, plans, or attempt; a previous suicide attempt by you or a family member -tobacco smoker -unusual vaginal bleeding (women) -an unusual or allergic reaction to leuprolide, benzyl alcohol, other medicines, foods, dyes, or preservatives -pregnant or trying to get pregnant -breast-feeding How should I use this medicine? This medicine is for injection into a muscle or for injection under the skin. It is given by a health care professional in a hospital or clinic setting. The specific product will determine how it will be given to you. Make sure you understand which product you receive and how often you will receive it. Talk to your pediatrician regarding the use of this medicine in children. Special care may be needed. Overdosage: If you think you have taken too much of this medicine contact a poison control center or emergency room at once. NOTE: This medicine is only for you. Do not share this medicine with others. What if I miss a dose? It is important not to miss a dose.  Call your doctor or health care professional if you are unable to keep an appointment. Depot injections: Depot injections are given either once-monthly, every 12 weeks, every 16 weeks, or every 24 weeks depending on the product you are prescribed. The product you are prescribed will be based on if you are female or female, and your condition. Make sure you understand your product and dosing. What may interact with this medicine? Do not take this medicine with any of the following medications: -chasteberry This medicine may also interact with the following medications: -herbal or dietary supplements, like black cohosh or DHEA -female hormones, like estrogens or progestins and birth control pills, patches, rings, or injections -female hormones, like testosterone This list may not describe all possible interactions. Give your health care provider a list of all the medicines, herbs, non-prescription drugs, or dietary supplements you use. Also tell them if you smoke, drink alcohol, or use illegal drugs. Some items may interact with your medicine. What should I watch for while using this medicine? Visit your doctor or health care professional for regular checks on your progress. During the first weeks of treatment, your symptoms may get worse, but then will improve as you continue your treatment. You may get hot flashes, increased bone pain, increased difficulty passing urine, or an aggravation of nerve symptoms. Discuss these effects with your doctor or health care professional, some of them may improve with continued use of this medicine. Female patients may experience a menstrual cycle or spotting during the first months of therapy with this medicine. If this continues, contact your doctor or health care professional. What side effects may I notice from receiving this medicine? Side   effects that you should report to your doctor or health care professional as soon as possible: -allergic reactions like skin  rash, itching or hives, swelling of the face, lips, or tongue -breathing problems -chest pain -depression or memory disorders -pain in your legs or groin -pain at site where injected or implanted -seizures -severe headache -swelling of the feet and legs -suicidal thoughts or other mood changes -visual changes -vomiting Side effects that usually do not require medical attention (report to your doctor or health care professional if they continue or are bothersome): -breast swelling or tenderness -decrease in sex drive or performance -diarrhea -hot flashes -loss of appetite -muscle, joint, or bone pains -nausea -redness or irritation at site where injected or implanted -skin problems or acne This list may not describe all possible side effects. Call your doctor for medical advice about side effects. You may report side effects to FDA at 1-800-FDA-1088. Where should I keep my medicine? This drug is given in a hospital or clinic and will not be stored at home. NOTE: This sheet is a summary. It may not cover all possible information. If you have questions about this medicine, talk to your doctor, pharmacist, or health care provider.  2018 Elsevier/Gold Standard (2015-09-21 09:45:53)  

## 2017-02-21 ENCOUNTER — Other Ambulatory Visit: Payer: Self-pay | Admitting: Gynecologic Oncology

## 2017-02-21 DIAGNOSIS — D3910 Neoplasm of uncertain behavior of unspecified ovary: Secondary | ICD-10-CM

## 2017-02-27 MED FILL — OMEPRAZOLE 20 MG CAP: 20 | 90 days supply | Qty: 90 | Fill #2

## 2017-03-11 DIAGNOSIS — Z13820 Encounter for screening for osteoporosis: Secondary | ICD-10-CM | POA: Diagnosis not present

## 2017-03-11 DIAGNOSIS — Z01419 Encounter for gynecological examination (general) (routine) without abnormal findings: Secondary | ICD-10-CM | POA: Diagnosis not present

## 2017-03-11 DIAGNOSIS — Z1231 Encounter for screening mammogram for malignant neoplasm of breast: Secondary | ICD-10-CM | POA: Diagnosis not present

## 2017-03-11 DIAGNOSIS — Z6835 Body mass index (BMI) 35.0-35.9, adult: Secondary | ICD-10-CM | POA: Diagnosis not present

## 2017-03-27 DIAGNOSIS — J019 Acute sinusitis, unspecified: Secondary | ICD-10-CM | POA: Diagnosis not present

## 2017-03-27 DIAGNOSIS — J00 Acute nasopharyngitis [common cold]: Secondary | ICD-10-CM | POA: Diagnosis not present

## 2017-03-27 MED FILL — AZITHROMYCIN 250 MG TAB: 250 | 1 days supply | Qty: 6 | Fill #0

## 2017-03-27 MED FILL — ESCITALOPRAM 20 MG TABLET: 20 | 90 days supply | Qty: 90 | Fill #3

## 2017-04-11 DIAGNOSIS — M25571 Pain in right ankle and joints of right foot: Secondary | ICD-10-CM | POA: Diagnosis not present

## 2017-05-05 ENCOUNTER — Other Ambulatory Visit: Payer: Self-pay | Admitting: Gynecologic Oncology

## 2017-05-05 DIAGNOSIS — C569 Malignant neoplasm of unspecified ovary: Secondary | ICD-10-CM

## 2017-05-13 DIAGNOSIS — K529 Noninfective gastroenteritis and colitis, unspecified: Secondary | ICD-10-CM | POA: Diagnosis not present

## 2017-05-26 ENCOUNTER — Other Ambulatory Visit: Payer: 59

## 2017-05-27 ENCOUNTER — Ambulatory Visit (HOSPITAL_COMMUNITY)
Admission: RE | Admit: 2017-05-27 | Discharge: 2017-05-27 | Disposition: A | Discharge status: Home / Self Care | Payer: 59 | Source: Ambulatory Visit | Attending: Gynecologic Oncology | Admitting: Gynecologic Oncology

## 2017-05-27 ENCOUNTER — Ambulatory Visit (HOSPITAL_COMMUNITY): Admission: RE | Admit: 2017-05-27 | Payer: 59 | Source: Ambulatory Visit

## 2017-05-27 ENCOUNTER — Inpatient Hospital Stay: Payer: 59 | Attending: Gynecology

## 2017-05-27 DIAGNOSIS — K219 Gastro-esophageal reflux disease without esophagitis: Secondary | ICD-10-CM | POA: Diagnosis not present

## 2017-05-27 DIAGNOSIS — C569 Malignant neoplasm of unspecified ovary: Secondary | ICD-10-CM | POA: Diagnosis not present

## 2017-05-27 DIAGNOSIS — C786 Secondary malignant neoplasm of retroperitoneum and peritoneum: Secondary | ICD-10-CM | POA: Diagnosis not present

## 2017-05-27 DIAGNOSIS — Z79818 Long term (current) use of other agents affecting estrogen receptors and estrogen levels: Secondary | ICD-10-CM | POA: Diagnosis not present

## 2017-05-27 DIAGNOSIS — Z90722 Acquired absence of ovaries, bilateral: Secondary | ICD-10-CM | POA: Diagnosis not present

## 2017-05-27 DIAGNOSIS — Z79899 Other long term (current) drug therapy: Secondary | ICD-10-CM | POA: Insufficient documentation

## 2017-05-27 DIAGNOSIS — Z90710 Acquired absence of both cervix and uterus: Secondary | ICD-10-CM | POA: Insufficient documentation

## 2017-05-27 DIAGNOSIS — D3912 Neoplasm of uncertain behavior of left ovary: Secondary | ICD-10-CM | POA: Diagnosis not present

## 2017-05-27 DIAGNOSIS — N183 Chronic kidney disease, stage 3 (moderate): Secondary | ICD-10-CM | POA: Diagnosis not present

## 2017-05-27 DIAGNOSIS — D3910 Neoplasm of uncertain behavior of unspecified ovary: Secondary | ICD-10-CM

## 2017-05-27 DIAGNOSIS — D4959 Neoplasm of unspecified behavior of other genitourinary organ: Secondary | ICD-10-CM | POA: Diagnosis not present

## 2017-05-27 DIAGNOSIS — C787 Secondary malignant neoplasm of liver and intrahepatic bile duct: Secondary | ICD-10-CM | POA: Insufficient documentation

## 2017-05-27 LAB — POCT I-STAT CREATININE: CREATININE: 1.1 mg/dL — AB (ref 0.44–1.00)

## 2017-05-27 LAB — BASIC METABOLIC PANEL
ANION GAP: 9 (ref 3–11)
BUN: 27 mg/dL — ABNORMAL HIGH (ref 7–26)
CO2: 29 mmol/L (ref 22–29)
Calcium: 9.4 mg/dL (ref 8.4–10.4)
Chloride: 103 mmol/L (ref 98–109)
Creatinine, Ser: 1.16 mg/dL — ABNORMAL HIGH (ref 0.60–1.10)
GFR calc non Af Amer: 50 mL/min — ABNORMAL LOW (ref >60)
GFR, EST AFRICAN AMERICAN: 58 mL/min — AB (ref >60)
GLUCOSE: 95 mg/dL (ref 70–140)
POTASSIUM: 4.2 mmol/L (ref 3.5–5.1)
Sodium: 141 mmol/L (ref 136–145)

## 2017-05-27 MED ORDER — GADOBENATE DIMEGLUMINE 529 MG/ML IV SOLN
20.0000 mL | Freq: Once | INTRAVENOUS | Status: AC | PRN
Start: 2017-05-27 — End: 2017-05-27
  Administered 2017-05-27: 20 mL via INTRAVENOUS

## 2017-05-30 ENCOUNTER — Inpatient Hospital Stay (HOSPITAL_BASED_OUTPATIENT_CLINIC_OR_DEPARTMENT_OTHER): Payer: 59 | Admitting: Gynecology

## 2017-05-30 ENCOUNTER — Encounter: Payer: Self-pay | Admitting: Gynecology

## 2017-05-30 ENCOUNTER — Inpatient Hospital Stay: Payer: 59

## 2017-05-30 VITALS — BP 119/72 | HR 60 | Temp 97.5°F | Resp 20 | Ht 66.0 in | Wt 215.1 lb

## 2017-05-30 DIAGNOSIS — N183 Chronic kidney disease, stage 3 (moderate): Secondary | ICD-10-CM | POA: Diagnosis not present

## 2017-05-30 DIAGNOSIS — Z90710 Acquired absence of both cervix and uterus: Secondary | ICD-10-CM | POA: Diagnosis not present

## 2017-05-30 DIAGNOSIS — Z90722 Acquired absence of ovaries, bilateral: Secondary | ICD-10-CM | POA: Diagnosis not present

## 2017-05-30 DIAGNOSIS — K219 Gastro-esophageal reflux disease without esophagitis: Secondary | ICD-10-CM | POA: Diagnosis not present

## 2017-05-30 DIAGNOSIS — C786 Secondary malignant neoplasm of retroperitoneum and peritoneum: Secondary | ICD-10-CM | POA: Diagnosis not present

## 2017-05-30 DIAGNOSIS — C569 Malignant neoplasm of unspecified ovary: Secondary | ICD-10-CM

## 2017-05-30 DIAGNOSIS — Z79899 Other long term (current) drug therapy: Secondary | ICD-10-CM | POA: Diagnosis not present

## 2017-05-30 DIAGNOSIS — Z79818 Long term (current) use of other agents affecting estrogen receptors and estrogen levels: Secondary | ICD-10-CM

## 2017-05-30 MED ORDER — LEUPROLIDE ACETATE (3 MONTH) 11.25 MG IM KIT
11.2500 mg | PACK | Freq: Once | INTRAMUSCULAR | Status: AC
  Administered 2017-05-30: 11.25 mg via INTRAMUSCULAR
  Filled 2017-05-30: qty 11.25

## 2017-05-30 MED ORDER — LEUPROLIDE ACETATE (3 MONTH) 11.25 MG IM KIT
11.2500 mg | PACK | Freq: Once | INTRAMUSCULAR | Status: DC
Start: 2017-05-30 — End: 2017-05-30

## 2017-05-30 NOTE — Progress Notes (Signed)
Consult Note: Gyn-Onc   [name] 62 y.o. female  No chief complaint on file.   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  She is tolerating the Lupron well.  Plan:  I reviewed the patient's recent MRI with her identifying areas of significant response.  Obviously patient and I are very happy.  Inhibin B is pending but given the response based on MRI findings we will continue Lupron.  She will have another injection Lupron 11.25 mg.  We will repeat in inhibin B in 3 months and plan repeat MRI imaging in 6 months.  Interval History: The patient returns today as previously scheduled for followup.  She received Lupron 3 months ago.  An MRI after 3 months of Lupron showed a response to therapy with specific shrinkage of tumor including a cystic lesion adjacent to the liver measuring 0.9 cm (previously 11.1 cm.  There were some stable lesions.  However a lesion in the left adnexa measured 2.4 x 1.7 cm (previously 4.2 x 3.  4 cm) another lesion now measures 12 x 12 mm (previously 16 x 15 mm.  . There are several nodules which remained stable also.  Overall, the patient is entirely asymptomatic and her functional status is excellent. 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 At her last visit we  HPI:The patient has a long-standing history of a granulosa cell tumor of the ovary dating back to 63. She had a recurrence in 2002 and the upper abdomen which was resected and she was subsequently treated with 6 cycles of intraperitoneal cisplatin and etoposide completed in June of 2002. She was followed between 2002 in 2010 with CT scans. In November 2010 CT scan showed increased size of nodules in the pelvis. At that time she underwent exploratory laparotomy with resection of tumor nodules near the cecum and left pelvic sidewall.. The tumor was estrogen receptor negative and progesterone receptor  positive. She alternated two-week courses of Megace and tamoxifen between June and December 2013. At that time CT scan showed progressive disease and the patient was switched to letrozole.  In October 2017 the patient was found to have progressive disease on MRI and her management regimen was changed to tamoxifen 20 mg daily for 2 weeks alternating with Megace 40 mg 3 times a day for 2 weeks.  Follow-up MRI in May 2018 showed progressive disease with peritoneal implants near the liver and pelvis. In October 2018, the treatment was changed to Lupron 11. 2 5 mg q 3 months.   We requested a core biopsy for assessment of ER/PR and Foundation One testing.  Unfortunately, there was not enough tissue for Foundation One testing.  ER was 50% and PR 90%. Review of the literature seeking other treatments revealed possible alternative treatments including: Lupron Bevacizamab Letrozole 2.5 mg plus Metformin 500 mg Carbo/Taxol Everolimus plus exemestane  In October 2018 we began treatment with Lupron 11.25 mg IM every 3 months.  After 3 months she had a nice initial response based on MRI findings.   Review of Systems:10 point review of systems is negative as noted above.   Vitals: Blood pressure 119/72, pulse 60, temperature (!) 97.5 F (36.4 C), temperature source Oral, resp. rate 20, height 5\' 6"  (1.676 m), weight 215 lb 1.6 oz (97.6 kg), SpO2 99 %.  Physical Exam: General : The patient is a healthy woman in no acute distress.  HEENT: normocephalic, extraoccular  movements normal; neck is supple without thyromegally  Lynphnodes: Supraclavicular and inguinal nodes not enlarged  Abdomen: Soft, no ascites, no organomegally, no masses, no hernias .   Pelvic:  EGBUS: Normal female  Vagina: Normal, no lesions  Urethra and Bladder: Normal, non-tender  Cervix: Surgically absent  Uterus: Surgically absent  Bi-manual examination: Non-tender; no adenxal masses or nodularity  Rectal: normal sphincter  tone, no masses, no blood   Lower extremities: No edema or varicosities. Normal range of motion       Allergies  Allergen Reactions  . Sodium Thiosalicylate     UNSPECIFIED REACTION   . Ciprofloxacin Other (See Comments)    FATIGUE  . Codone [Hydrocodone Bitartrate] Itching  . Stadol [Butorphanol Tartrate] Other (See Comments)    ALTERED MENTAL STATUS  . Sulfa Antibiotics Rash    Past Medical History:  Diagnosis Date  . Anxiety   . Arthritis   . Cervical syndrome   . CKD (chronic kidney disease), stage III ([place])   . GERD (gastroesophageal reflux disease)   . Granulosa cell tumor of ovary   . History of hiatal hernia   . Obesity   . Ovarian cancer ([place])   . PONV (postoperative nausea and vomiting)    history of n/v,  past surgeries no n/v    Past Surgical History:  Procedure Laterality Date  . ABDOMINAL HYSTERECTOMY     TAH/BSO  . EXPLORATORY LAPAROTOMY     for bowel obstruction  . Secondary tumor debulking  2002  . Tumor debulking  2010  . VENTRAL HERNIA REPAIR      Current Outpatient Medications  Medication Sig Dispense Refill  . escitalopram (LEXAPRO) 20 MG tablet Take 10 mg by mouth daily.     Marland Kitchen omeprazole (PRILOSEC) 20 MG capsule Take 20 mg by mouth daily as needed (heartburn).     . polyethylene glycol (MIRALAX / GLYCOLAX) packet Take 17 g by mouth daily as needed for mild constipation.    . Prenatal Vit-Fe Fumarate-FA (PRENATAL MULTIVITAMIN) TABS tablet Take 1 tablet by mouth daily at 12 noon.    . Probiotic Product (ALIGN PO) Take 1 tablet by mouth daily.      No current facility-administered medications for this visit.     Social History   Socioeconomic History  . Marital status: Married    Spouse name: Not on file  . Number of children: Not on file  . Years of education: Not on file  . Highest education level: Not on file  Social Needs  . Financial resource strain: Not on file  . Food insecurity - worry: Not on file  . Food insecurity -  inability: Not on file  . Transportation needs - medical: Not on file  . Transportation needs - non-medical: Not on file  Occupational History  . Not on file  Tobacco Use  . Smoking status: Never Smoker  . Smokeless tobacco: Never Used  Substance and Sexual Activity  . Alcohol use: Yes    Comment: One 8oz glass, socially  . Drug use: No  . Sexual activity: No  Other Topics Concern  . Not on file  Social History Narrative  . Not on file    Family History  Problem Relation Age of Onset  . Stroke Father       [name], MD 05/30/2017, 8:16 AM

## 2017-05-30 NOTE — Patient Instructions (Signed)
Leuprolide depot injection What is this medicine? LEUPROLIDE (loo PROE lide) is a man-made protein that acts like a natural hormone in the body. It decreases testosterone in men and decreases estrogen in women. In men, this medicine is used to treat advanced prostate cancer. In women, some forms of this medicine may be used to treat endometriosis, uterine fibroids, or other female hormone-related problems. This medicine may be used for other purposes; ask your health care provider or pharmacist if you have questions. COMMON BRAND NAME(S): Eligard, Lupron Depot, Lupron Depot-Ped, Viadur What should I tell my health care provider before I take this medicine? They need to know if you have any of these conditions: -diabetes -heart disease or previous heart attack -high blood pressure -high cholesterol -mental illness -osteoporosis -pain or difficulty passing urine -seizures -spinal cord metastasis -stroke -suicidal thoughts, plans, or attempt; a previous suicide attempt by you or a family member -tobacco smoker -unusual vaginal bleeding (women) -an unusual or allergic reaction to leuprolide, benzyl alcohol, other medicines, foods, dyes, or preservatives -pregnant or trying to get pregnant -breast-feeding How should I use this medicine? This medicine is for injection into a muscle or for injection under the skin. It is given by a health care professional in a hospital or clinic setting. The specific product will determine how it will be given to you. Make sure you understand which product you receive and how often you will receive it. Talk to your pediatrician regarding the use of this medicine in children. Special care may be needed. Overdosage: If you think you have taken too much of this medicine contact a poison control center or emergency room at once. NOTE: This medicine is only for you. Do not share this medicine with others. What if I miss a dose? It is important not to miss a dose.  Call your doctor or health care professional if you are unable to keep an appointment. Depot injections: Depot injections are given either once-monthly, every 12 weeks, every 16 weeks, or every 24 weeks depending on the product you are prescribed. The product you are prescribed will be based on if you are female or female, and your condition. Make sure you understand your product and dosing. What may interact with this medicine? Do not take this medicine with any of the following medications: -chasteberry This medicine may also interact with the following medications: -herbal or dietary supplements, like black cohosh or DHEA -female hormones, like estrogens or progestins and birth control pills, patches, rings, or injections -female hormones, like testosterone This list may not describe all possible interactions. Give your health care provider a list of all the medicines, herbs, non-prescription drugs, or dietary supplements you use. Also tell them if you smoke, drink alcohol, or use illegal drugs. Some items may interact with your medicine. What should I watch for while using this medicine? Visit your doctor or health care professional for regular checks on your progress. During the first weeks of treatment, your symptoms may get worse, but then will improve as you continue your treatment. You may get hot flashes, increased bone pain, increased difficulty passing urine, or an aggravation of nerve symptoms. Discuss these effects with your doctor or health care professional, some of them may improve with continued use of this medicine. Female patients may experience a menstrual cycle or spotting during the first months of therapy with this medicine. If this continues, contact your doctor or health care professional. What side effects may I notice from receiving this medicine? Side   effects that you should report to your doctor or health care professional as soon as possible: -allergic reactions like skin  rash, itching or hives, swelling of the face, lips, or tongue -breathing problems -chest pain -depression or memory disorders -pain in your legs or groin -pain at site where injected or implanted -seizures -severe headache -swelling of the feet and legs -suicidal thoughts or other mood changes -visual changes -vomiting Side effects that usually do not require medical attention (report to your doctor or health care professional if they continue or are bothersome): -breast swelling or tenderness -decrease in sex drive or performance -diarrhea -hot flashes -loss of appetite -muscle, joint, or bone pains -nausea -redness or irritation at site where injected or implanted -skin problems or acne This list may not describe all possible side effects. Call your doctor for medical advice about side effects. You may report side effects to FDA at 1-800-FDA-1088. Where should I keep my medicine? This drug is given in a hospital or clinic and will not be stored at home. NOTE: This sheet is a summary. It may not cover all possible information. If you have questions about this medicine, talk to your doctor, pharmacist, or health care provider.  2018 Elsevier/Gold Standard (2015-09-21 09:45:53)  

## 2017-05-31 LAB — ANTI MULLERIAN HORMONE: ANTI-MULLERIAN HORMONE (AMH): 2.12 ng/mL

## 2017-06-03 LAB — INHIBIN B: Inhibin B: 58.9 pg/mL — ABNORMAL HIGH (ref 0.0–16.9)

## 2017-06-04 MED FILL — OMEPRAZOLE 20 MG CAP: 20 | 90 days supply | Qty: 90 | Fill #3

## 2017-07-01 MED FILL — ESCITALOPRAM 20 MG TABLET: 20 | 90 days supply | Qty: 90 | Fill #0

## 2017-07-17 DIAGNOSIS — M1712 Unilateral primary osteoarthritis, left knee: Secondary | ICD-10-CM | POA: Diagnosis not present

## 2017-07-24 DIAGNOSIS — M1712 Unilateral primary osteoarthritis, left knee: Secondary | ICD-10-CM | POA: Diagnosis not present

## 2017-07-27 DIAGNOSIS — R001 Bradycardia, unspecified: Secondary | ICD-10-CM | POA: Diagnosis not present

## 2017-07-27 DIAGNOSIS — H9209 Otalgia, unspecified ear: Secondary | ICD-10-CM | POA: Diagnosis not present

## 2017-07-29 DIAGNOSIS — R0683 Snoring: Secondary | ICD-10-CM | POA: Diagnosis not present

## 2017-07-29 DIAGNOSIS — H9202 Otalgia, left ear: Secondary | ICD-10-CM | POA: Diagnosis not present

## 2017-07-29 DIAGNOSIS — Z7289 Other problems related to lifestyle: Secondary | ICD-10-CM | POA: Diagnosis not present

## 2017-07-29 DIAGNOSIS — T451X5A Adverse effect of antineoplastic and immunosuppressive drugs, initial encounter: Secondary | ICD-10-CM | POA: Diagnosis not present

## 2017-07-31 DIAGNOSIS — M1712 Unilateral primary osteoarthritis, left knee: Secondary | ICD-10-CM | POA: Diagnosis not present

## 2017-08-18 ENCOUNTER — Ambulatory Visit (INDEPENDENT_AMBULATORY_CARE_PROVIDER_SITE_OTHER): Payer: 59 | Admitting: Internal Medicine

## 2017-08-18 ENCOUNTER — Encounter: Payer: Self-pay | Admitting: Internal Medicine

## 2017-08-18 VITALS — BP 118/74 | HR 55 | Ht 66.0 in | Wt 215.2 lb

## 2017-08-18 DIAGNOSIS — G4733 Obstructive sleep apnea (adult) (pediatric): Secondary | ICD-10-CM

## 2017-08-18 DIAGNOSIS — R0683 Snoring: Secondary | ICD-10-CM | POA: Diagnosis not present

## 2017-08-18 DIAGNOSIS — H903 Sensorineural hearing loss, bilateral: Secondary | ICD-10-CM | POA: Diagnosis not present

## 2017-08-18 NOTE — Patient Instructions (Signed)
Order- please schedule unattended home sleep test     Dx OSA  About 2 weeks after your sleep test, please call me for results and recommendations. If appropriate, I may be able to start treatment before we see you next.

## 2017-08-18 NOTE — Assessment & Plan Note (Signed)
Medical questions whether this is primary snoring or OSA.  I discussed the physiology, medical concerns, evaluation and treatment options. Plan-schedule sleep study

## 2017-08-18 NOTE — Progress Notes (Signed)
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 Blenda Nicely ENT for snoring. Denies ever having a sleep study before. Per patient, she has been snoring for years.  Family tells her she snores but has not told her of witnessed apneas.  She is up and active during the day, not noticing daytime sleepiness.  She thinks she sleeps okay at night and gets enough sleep.  No sleep medicines.  2 cups of coffee in the morning.  Not aware of any parasomnias. Medical problem list includes CKD 3, GERD, malignant granulosa cell tumor of ovary, No history of ENT surgery.  PACs on prior EKG.  No history of lung disease. Epworth score 11  Prior to Admission medications   Medication Sig Start Date End Date Taking? Authorizing Provider  escitalopram (LEXAPRO) 20 MG tablet Take 10 mg by mouth daily.    Yes [provider]  omeprazole (PRILOSEC) 20 MG capsule Take 20 mg by mouth daily as needed (heartburn).    Yes [provider]  polyethylene glycol (MIRALAX / GLYCOLAX) packet Take 17 g by mouth daily as needed for mild constipation.   Yes [provider]  Prenatal Vit-Fe Fumarate-FA (PRENATAL MULTIVITAMIN) TABS tablet Take 1 tablet by mouth daily at 12 noon.   Yes [provider]  Probiotic Product (ALIGN PO) Take 1 tablet by mouth daily.    Yes [provider]   Past Medical History:  Diagnosis Date  . Anxiety   . Arthritis   . Cervical syndrome   . CKD (chronic kidney disease), stage III ([place])   . GERD (gastroesophageal reflux disease)   . Granulosa cell tumor of ovary   . History of hiatal hernia   . Obesity   . Ovarian cancer ([place])   . PONV (postoperative nausea and vomiting)    history of n/v,  past surgeries no n/v   Past Surgical History:  Procedure Laterality Date  . ABDOMINAL HYSTERECTOMY     TAH/BSO  . EXPLORATORY LAPAROTOMY     for bowel obstruction  . Secondary tumor debulking  2002  . Tumor debulking   2010  . VENTRAL HERNIA REPAIR     Family History  Problem Relation Age of Onset  . Stroke Father    Social History   Socioeconomic History  . Marital status: Married    Spouse name: Not on file  . Number of children: Not on file  . Years of education: Not on file  . Highest education level: Not on file  Occupational History  . Not on file  Social Needs  . Financial resource strain: Not on file  . Food insecurity:    Worry: Not on file    Inability: Not on file  . Transportation needs:    Medical: Not on file    Non-medical: Not on file  Tobacco Use  . Smoking status: Never Smoker  . Smokeless tobacco: Never Used  Substance and Sexual Activity  . Alcohol use: Yes    Comment: One 8oz glass, socially  . Drug use: No  . Sexual activity: Never  Lifestyle  . Physical activity:    Days per week: Not on file    Minutes per session: Not on file  . Stress: Not on file  Relationships  . Social connections:    Talks on phone: Not on file    Gets together: Not on file    Attends religious service: Not on file    Active member of club  or organization: Not on file    Attends meetings of clubs or organizations: Not on file    Relationship status: Not on file  . Intimate partner violence:    Fear of current or ex partner: Not on file    Emotionally abused: Not on file    Physically abused: Not on file    Forced sexual activity: Not on file  Other Topics Concern  . Not on file  Social History Narrative  . Not on file   ROS-see HPI   + = positive Constitutional:    weight loss, night sweats, fevers, chills, fatigue, lassitude. HEENT:    headaches, difficulty swallowing, tooth/dental problems, sore throat,       sneezing, itching, ear ache, nasal congestion, post nasal drip, snoring CV:    chest pain, orthopnea, PND, swelling in lower extremities, anasarca,                                                     dizziness, palpitations Resp:   shortness of breath with exertion  or at rest.                productive cough,   non-productive cough, coughing up of blood.              change in color of mucus.  wheezing.   Skin:    rash or lesions. GI:  No-   heartburn, indigestion, abdominal pain, nausea, vomiting, diarrhea,                 change in bowel habits, loss of appetite GU: dysuria, change in color of urine, no urgency or frequency.   flank pain. MS:   joint pain, stiffness, decreased range of motion, back pain. Neuro-     nothing unusual Psych:  change in mood or affect.  depression or anxiety.   memory loss.  OBJ- Physical Exam General- Alert, Oriented, Affect-appropriate, Distress- none acute + overweight Skin- rash-none, lesions- none, excoriation- none Lymphadenopathy- none Head- atraumatic            Eyes- Gross vision intact, PERRLA, conjunctivae and secretions clear            Ears- Hearing, canals-normal            Nose- Clear, no-Septal dev, mucus, polyps, erosion, perforation             Throat- Mallampati IV , mucosa clear , drainage- none, tonsils- atrophic Neck- flexible , trachea midline, no stridor , thyroid nl, carotid no bruit Chest - symmetrical excursion , unlabored           Heart/CV- RRR , no murmur , no gallop  , no rub, nl s1 s2                           - JVD- none , edema- none, stasis changes- none, varices- none           Lung- clear to P&A, wheeze- none, cough- none , dullness-none, rub- none           Chest wall-  Abd-  Br/ Gen/ Rectal- Not done, not indicated Extrem- cyanosis- none, clubbing, none, atrophy- none, strength- nl Neuro- grossly intact to observation

## 2017-08-22 ENCOUNTER — Other Ambulatory Visit: Payer: Self-pay | Admitting: Gynecologic Oncology

## 2017-08-22 DIAGNOSIS — C569 Malignant neoplasm of unspecified ovary: Secondary | ICD-10-CM

## 2017-08-22 MED ORDER — LEUPROLIDE ACETATE (3 MONTH) 11.25 MG IM KIT: 11.2500 mg | PACK | Freq: Once | INTRAMUSCULAR | Status: DC

## 2017-08-26 ENCOUNTER — Other Ambulatory Visit: Payer: Self-pay | Admitting: Gynecologic Oncology

## 2017-08-26 ENCOUNTER — Inpatient Hospital Stay: Payer: 59 | Attending: Gynecology

## 2017-08-26 ENCOUNTER — Telehealth: Payer: Self-pay | Admitting: *Deleted

## 2017-08-26 DIAGNOSIS — C569 Malignant neoplasm of unspecified ovary: Secondary | ICD-10-CM | POA: Diagnosis not present

## 2017-08-26 DIAGNOSIS — Z5111 Encounter for antineoplastic chemotherapy: Secondary | ICD-10-CM | POA: Insufficient documentation

## 2017-08-26 NOTE — Telephone Encounter (Signed)
Patient called and scheduled her injection appt for this week. Moved her lab appt to today

## 2017-08-28 ENCOUNTER — Other Ambulatory Visit: Payer: Self-pay | Admitting: Gynecologic Oncology

## 2017-08-28 ENCOUNTER — Inpatient Hospital Stay: Payer: 59

## 2017-08-28 DIAGNOSIS — C569 Malignant neoplasm of unspecified ovary: Secondary | ICD-10-CM

## 2017-08-28 DIAGNOSIS — Z5111 Encounter for antineoplastic chemotherapy: Secondary | ICD-10-CM | POA: Diagnosis not present

## 2017-08-28 MED ORDER — LEUPROLIDE ACETATE (3 MONTH) 11.25 MG IM KIT
11.2500 mg | PACK | Freq: Once | INTRAMUSCULAR | Status: AC
  Administered 2017-08-28: 11.25 mg via INTRAMUSCULAR
  Filled 2017-08-28: qty 11.25

## 2017-08-28 NOTE — Patient Instructions (Signed)

## 2017-08-29 ENCOUNTER — Other Ambulatory Visit: Payer: 59

## 2017-09-01 LAB — ANTI MULLERIAN HORMONE: ANTI-MULLERIAN HORMONE (AMH): 1.77 ng/mL

## 2017-09-02 LAB — INHIBIN B: Inhibin B: 65.6 pg/mL — ABNORMAL HIGH (ref 0.0–16.9)

## 2017-09-03 DIAGNOSIS — M26609 Unspecified temporomandibular joint disorder, unspecified side: Secondary | ICD-10-CM | POA: Diagnosis not present

## 2017-09-03 DIAGNOSIS — H903 Sensorineural hearing loss, bilateral: Secondary | ICD-10-CM | POA: Diagnosis not present

## 2017-09-04 ENCOUNTER — Telehealth: Payer: Self-pay | Admitting: Internal Medicine

## 2017-09-04 NOTE — Telephone Encounter (Signed)
Called the patient back and scheduled them on 09/16/17

## 2017-09-16 DIAGNOSIS — G4733 Obstructive sleep apnea (adult) (pediatric): Secondary | ICD-10-CM | POA: Diagnosis not present

## 2017-09-17 DIAGNOSIS — G4733 Obstructive sleep apnea (adult) (pediatric): Secondary | ICD-10-CM | POA: Diagnosis not present

## 2017-09-18 ENCOUNTER — Other Ambulatory Visit: Payer: Self-pay | Admitting: *Deleted

## 2017-09-18 DIAGNOSIS — G4733 Obstructive sleep apnea (adult) (pediatric): Secondary | ICD-10-CM

## 2017-09-22 MED FILL — ESCITALOPRAM 20 MG TABLET: 20 | 90 days supply | Qty: 90 | Fill #1

## 2017-09-23 MED FILL — OMEPRAZOLE 20 MG CAP: 20 | 90 days supply | Qty: 90 | Fill #0

## 2017-09-25 ENCOUNTER — Other Ambulatory Visit: Payer: Self-pay | Admitting: Internal Medicine

## 2017-09-25 DIAGNOSIS — G4733 Obstructive sleep apnea (adult) (pediatric): Secondary | ICD-10-CM

## 2017-10-01 ENCOUNTER — Telehealth: Payer: Self-pay | Admitting: Internal Medicine

## 2017-10-01 NOTE — Telephone Encounter (Signed)
We received the CPAP equipment order form today but they had the wrong date of birth on the form and I spoke with Claiborne Billings at [facility] and she was having that fixed to [name]

## 2017-10-03 NOTE — Telephone Encounter (Signed)
Any updates on this Anita? 

## 2017-10-06 NOTE — Telephone Encounter (Signed)
I now have the form with the correct date of birth on it and have given it to Dr. Annamaria Boots to sign

## 2017-10-07 NOTE — Telephone Encounter (Signed)
Dr. Annamaria Boots has signed this form and it has been faxed to [name]. I have received confirmation that the fax was received

## 2017-10-07 NOTE — Telephone Encounter (Signed)
[name], please advise if you have received the form back from [facility] and if this encounter can be closed. Thanks!

## 2017-10-13 DIAGNOSIS — G4733 Obstructive sleep apnea (adult) (pediatric): Secondary | ICD-10-CM | POA: Diagnosis not present

## 2017-10-14 DIAGNOSIS — H4312 Vitreous hemorrhage, left eye: Secondary | ICD-10-CM | POA: Diagnosis not present

## 2017-10-14 DIAGNOSIS — H43392 Other vitreous opacities, left eye: Secondary | ICD-10-CM | POA: Diagnosis not present

## 2017-10-14 DIAGNOSIS — H43812 Vitreous degeneration, left eye: Secondary | ICD-10-CM | POA: Diagnosis not present

## 2017-10-16 ENCOUNTER — Ambulatory Visit: Payer: Self-pay | Admitting: Family Medicine

## 2017-10-16 VITALS — BP 115/80 | HR 54 | Temp 97.9°F | Resp 16 | Ht 66.0 in | Wt 210.8 lb

## 2017-10-16 DIAGNOSIS — Z0000 Encounter for general adult medical examination without abnormal findings: Secondary | ICD-10-CM

## 2017-10-16 NOTE — Progress Notes (Signed)
[name] is a 62 y.o. female who presents today with concerns of a need for an annual physical exam. He is under care of PCP and specilist- recent use of CPAP tis week- feel ike if is helping. Otherwise she is feeling well.  Review of Systems  Constitutional: Negative for chills, fever and malaise/fatigue.  HENT: Negative for congestion, ear discharge, ear pain, sinus pain and sore throat.   Eyes: Negative.   Respiratory: Negative for cough, sputum production and shortness of breath.   Cardiovascular: Negative.  Negative for chest pain.  Gastrointestinal: Negative for abdominal pain, diarrhea, nausea and vomiting.  Genitourinary: Negative for dysuria, frequency, hematuria and urgency.  Musculoskeletal: Negative for myalgias.  Skin: Negative.   Neurological: Negative for headaches.  Endo/Heme/Allergies: Negative.   Psychiatric/Behavioral: Negative.     O: Vitals:   10/16/17 1732  BP: 115/80  Pulse: (!) 54  Resp: 16  Temp: 97.9 F (36.6 C)  SpO2: 99%     Physical Exam  Constitutional: She is oriented to person, place, and time. Vital signs are normal. She appears well-developed and well-nourished. She is active.  Non-toxic appearance. She does not have a sickly appearance.  HENT:  Head: Normocephalic.  Right Ear: Hearing, tympanic membrane, external ear and ear canal normal.  Left Ear: Hearing, tympanic membrane, external ear and ear canal normal.  Nose: Nose normal.  Mouth/Throat: Uvula is midline and oropharynx is clear and moist.  Neck: Normal range of motion. Neck supple.  Cardiovascular: Normal rate, regular rhythm, normal heart sounds and normal pulses.  Pulmonary/Chest: Effort normal and breath sounds normal.  Abdominal: Soft. Bowel sounds are normal.  Musculoskeletal: Normal range of motion.  Lymphadenopathy:       Head (right side): No submental and no submandibular adenopathy present.       Head (left side): No submental and no submandibular adenopathy present.     She has no cervical adenopathy.  Neurological: She is alert and oriented to person, place, and time.  Psychiatric: She has a normal mood and affect. Her speech is normal and behavior is normal. Cognition and memory are normal.  PHQ-9- negative  Vitals reviewed.  A: 1. Physical exam    P: Exam findings, diagnosis etiology and medication use and indications reviewed with patient. Follow- Up and discharge instructions provided. No emergent/urgent issues found on exam.  Patient verbalized understanding of information provided and agrees with plan of care (POC), all questions answered.  1. Physical exam WNL- completed

## 2017-10-16 NOTE — Patient Instructions (Signed)

## 2017-11-03 DIAGNOSIS — H43392 Other vitreous opacities, left eye: Secondary | ICD-10-CM | POA: Diagnosis not present

## 2017-11-03 DIAGNOSIS — H43812 Vitreous degeneration, left eye: Secondary | ICD-10-CM | POA: Diagnosis not present

## 2017-11-12 ENCOUNTER — Other Ambulatory Visit: Payer: Self-pay | Admitting: Gynecologic Oncology

## 2017-11-12 DIAGNOSIS — C569 Malignant neoplasm of unspecified ovary: Secondary | ICD-10-CM

## 2017-11-12 DIAGNOSIS — G4733 Obstructive sleep apnea (adult) (pediatric): Secondary | ICD-10-CM | POA: Diagnosis not present

## 2017-11-12 NOTE — Progress Notes (Signed)
Follow up scans and labs per Dr. Judeth Porch

## 2017-11-16 ENCOUNTER — Encounter: Payer: Self-pay | Admitting: Internal Medicine

## 2017-11-17 ENCOUNTER — Ambulatory Visit (INDEPENDENT_AMBULATORY_CARE_PROVIDER_SITE_OTHER): Payer: 59 | Admitting: Internal Medicine

## 2017-11-17 ENCOUNTER — Encounter: Payer: Self-pay | Admitting: Internal Medicine

## 2017-11-17 VITALS — BP 124/68 | HR 62 | Ht 66.0 in | Wt 214.6 lb

## 2017-11-17 DIAGNOSIS — C569 Malignant neoplasm of unspecified ovary: Secondary | ICD-10-CM | POA: Diagnosis not present

## 2017-11-17 DIAGNOSIS — G4733 Obstructive sleep apnea (adult) (pediatric): Secondary | ICD-10-CM

## 2017-11-17 NOTE — Patient Instructions (Signed)
We can continue CPAP auto 5-20, mask of choice humidifier, supplies, AirView  Please call if we can help 

## 2017-11-17 NOTE — Assessment & Plan Note (Signed)
She is still being followed by oncology for this problem but reports no changes or concerns.

## 2017-11-17 NOTE — Assessment & Plan Note (Signed)
She is benefiting from CPAP with eradication of snoring.  No major comfort issues.  We discussed compliance and benefit goals. Plan-continue CPAP auto 5-20

## 2017-11-17 NOTE — Progress Notes (Signed)
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 Blenda Nicely ENT for snoring. Denies ever having a sleep study before. Per patient, she has been snoring for years.  Family tells her she snores but has not told her of witnessed apneas.  She is up and active during the day, not noticing daytime sleepiness.  She thinks she sleeps okay at night and gets enough sleep.  No sleep medicines.  2 cups of coffee in the morning.  Not aware of any parasomnias. Medical problem list includes CKD 3, GERD, malignant granulosa cell tumor of ovary, No history of ENT surgery.  PACs on prior EKG.  No history of lung disease. 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  She may not be sleeping quite as well as she gets used to wearing it, but no major concerns.  Using nasal pillows mask.  ROS-see HPI   + = positive Constitutional:    weight loss, night sweats, fevers, chills, fatigue, lassitude. HEENT:    headaches, difficulty swallowing, tooth/dental problems, sore throat,       sneezing, itching, ear ache, nasal congestion, post nasal drip, snoring CV:    chest pain, orthopnea, PND, swelling in lower extremities, anasarca,                                                     dizziness, palpitations Resp:   shortness of breath with exertion or at rest.                productive cough,   non-productive cough, coughing up of  blood.              change in color of mucus.  wheezing.   Skin:    rash or lesions. GI:  No-   heartburn, indigestion, abdominal pain, nausea, vomiting, diarrhea,                 change in bowel habits, loss of appetite GU: dysuria, change in color of urine, no urgency or frequency.   flank pain. MS:   joint pain, stiffness, decreased range of motion, back pain. Neuro-     nothing unusual Psych:  change in mood or affect.  depression or anxiety.   memory loss.  OBJ- Physical Exam General- Alert, Oriented, Affect-appropriate, Distress- none acute + overweight Skin- rash-none, lesions- none, excoriation- none Lymphadenopathy- none Head- atraumatic            Eyes- Gross vision intact, PERRLA, conjunctivae and secretions clear            Ears- Hearing, canals-normal            Nose- Clear, no-Septal dev, mucus, polyps, erosion, perforation             Throat- Mallampati IV , mucosa clear , drainage- none, tonsils- atrophic Neck- flexible , trachea midline, no stridor , thyroid nl, carotid no bruit Chest - symmetrical excursion , unlabored  Heart/CV- RRR , no murmur , no gallop  , no rub, nl s1 s2                           - JVD- none , edema- none, stasis changes- none, varices- none           Lung- clear to P&A, wheeze- none, cough- none , dullness-none, rub- none           Chest wall-  Abd-  Br/ Gen/ Rectal- Not done, not indicated Extrem- cyanosis- none, clubbing, none, atrophy- none, strength- nl Neuro- grossly intact to observation

## 2017-11-21 ENCOUNTER — Inpatient Hospital Stay: Payer: 59 | Attending: Gynecology

## 2017-11-21 DIAGNOSIS — Z79818 Long term (current) use of other agents affecting estrogen receptors and estrogen levels: Secondary | ICD-10-CM | POA: Diagnosis not present

## 2017-11-21 DIAGNOSIS — C569 Malignant neoplasm of unspecified ovary: Secondary | ICD-10-CM | POA: Diagnosis not present

## 2017-11-21 LAB — BASIC METABOLIC PANEL
Anion gap: 11 (ref 5–15)
BUN: 21 mg/dL — AB (ref 6–20)
CALCIUM: 9.1 mg/dL (ref 8.9–10.3)
CHLORIDE: 104 mmol/L (ref 98–111)
CO2: 25 mmol/L (ref 22–32)
CREATININE: 1.03 mg/dL — AB (ref 0.44–1.00)
GFR calc non Af Amer: 58 mL/min — ABNORMAL LOW (ref >60)
Glucose, Bld: 114 mg/dL — ABNORMAL HIGH (ref 70–99)
Potassium: 4.3 mmol/L (ref 3.5–5.1)
Sodium: 140 mmol/L (ref 135–145)

## 2017-11-25 ENCOUNTER — Ambulatory Visit (HOSPITAL_COMMUNITY): Payer: 59

## 2017-11-25 LAB — INHIBIN B: INHIBIN B: 85.8 pg/mL — AB (ref 0.0–16.9)

## 2017-11-27 DIAGNOSIS — M25511 Pain in right shoulder: Secondary | ICD-10-CM | POA: Diagnosis not present

## 2017-11-27 DIAGNOSIS — M7541 Impingement syndrome of right shoulder: Secondary | ICD-10-CM | POA: Diagnosis not present

## 2017-11-27 LAB — ANTI MULLERIAN HORMONE: ANTI-MULLERIAN HORMONE (AMH): 3.26 ng/mL

## 2017-11-28 ENCOUNTER — Ambulatory Visit (HOSPITAL_COMMUNITY)
Admission: RE | Admit: 2017-11-28 | Discharge: 2017-11-28 | Disposition: A | Discharge status: Home / Self Care | Payer: 59 | Source: Ambulatory Visit | Attending: Gynecologic Oncology | Admitting: Gynecologic Oncology

## 2017-11-28 DIAGNOSIS — R1907 Generalized intra-abdominal and pelvic swelling, mass and lump: Secondary | ICD-10-CM | POA: Diagnosis not present

## 2017-11-28 DIAGNOSIS — M899 Disorder of bone, unspecified: Secondary | ICD-10-CM | POA: Diagnosis not present

## 2017-11-28 DIAGNOSIS — K7689 Other specified diseases of liver: Secondary | ICD-10-CM | POA: Insufficient documentation

## 2017-11-28 DIAGNOSIS — C569 Malignant neoplasm of unspecified ovary: Secondary | ICD-10-CM

## 2017-11-28 DIAGNOSIS — K769 Liver disease, unspecified: Secondary | ICD-10-CM | POA: Diagnosis not present

## 2017-11-28 DIAGNOSIS — K668 Other specified disorders of peritoneum: Secondary | ICD-10-CM | POA: Diagnosis not present

## 2017-11-28 MED ORDER — GADOBENATE DIMEGLUMINE 529 MG/ML IV SOLN
20.0000 mL | Freq: Once | INTRAVENOUS | Status: AC | PRN
  Administered 2017-11-28: 20 mL via INTRAVENOUS

## 2017-12-02 ENCOUNTER — Inpatient Hospital Stay (HOSPITAL_BASED_OUTPATIENT_CLINIC_OR_DEPARTMENT_OTHER): Payer: 59 | Admitting: Gynecology

## 2017-12-02 ENCOUNTER — Encounter: Payer: Self-pay | Admitting: Gynecology

## 2017-12-02 ENCOUNTER — Inpatient Hospital Stay: Payer: 59

## 2017-12-02 ENCOUNTER — Other Ambulatory Visit: Payer: Self-pay | Admitting: Gynecologic Oncology

## 2017-12-02 VITALS — BP 127/70 | HR 50 | Temp 97.4°F | Resp 18 | Ht 66.0 in | Wt 212.0 lb

## 2017-12-02 DIAGNOSIS — C569 Malignant neoplasm of unspecified ovary: Secondary | ICD-10-CM

## 2017-12-02 DIAGNOSIS — Z79818 Long term (current) use of other agents affecting estrogen receptors and estrogen levels: Secondary | ICD-10-CM | POA: Diagnosis not present

## 2017-12-02 MED ORDER — LEUPROLIDE ACETATE (3 MONTH) 11.25 MG IM KIT: 11.2500 mg | PACK | Freq: Once | INTRAMUSCULAR | Status: DC

## 2017-12-02 NOTE — Progress Notes (Signed)
Consult Note: Gyn-Onc   [name] 62 y.o. female  Chief Complaint  Patient presents with  . 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  The patient remains entirely asymptomatic.  Tumor markers have increased however.  She is tolerating the Lupron well.  Plan:  I reviewed the patient's recent MRI and tumor markers with her.  Other options were discussed including cytotoxic chemotherapy or bevacizumab.  However in discussion with the patient who recognizes that the MRI shows a mixed response and that she is asymptomatic, she would like to continue Lupron for another 3 months.  Prior to her next visit in 3 months we will obtain an AMH and inhibin B.   Interval History: The patient returns today as previously scheduled for followup.  She is now received 9 months of Lupron.  Initially she had a response.  However she has been reevaluated recently and is noted that her AMH as well as inhibin B are increasing.  MRI of the abdomen pelvis shows a mixed response of nodules near the liver and pelvis.  Patient is asymptomatic except for some constipation.  She denies any other GI GU or pelvic symptoms.  Overall, the patient is entirely asymptomatic and her functional status is excellent. 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 At her last visit we  HPI:The patient has a long-standing history of a granulosa cell tumor of the ovary dating back to 17. She had a recurrence in 2002 and the upper abdomen which was resected and she was subsequently treated with 6 cycles of intraperitoneal cisplatin and etoposide completed in June of 2002. She was followed between 2002 in 2010 with CT scans. In November 2010 CT scan showed increased size of nodules in the pelvis. At that time she underwent exploratory laparotomy with resection of tumor nodules near the cecum and left  pelvic sidewall.. The tumor was estrogen receptor negative and progesterone receptor positive. She alternated two-week courses of Megace and tamoxifen between June and December 2013. At that time CT scan showed progressive disease and the patient was switched to letrozole.  In October 2017 the patient was found to have progressive disease on MRI and her management regimen was changed to tamoxifen 20 mg daily for 2 weeks alternating with Megace 40 mg 3 times a day for 2 weeks.  Follow-up MRI in May 2018 showed progressive disease with peritoneal implants near the liver and pelvis. In October 2018, the treatment was changed to Lupron 11. 2 5 mg q 3 months.   We requested a core biopsy for assessment of ER/PR and Foundation One testing.  Unfortunately, there was not enough tissue for Foundation One testing.  ER was 50% and PR 90%. Review of the literature seeking other treatments revealed possible alternative treatments including: Lupron Bevacizamab Letrozole 2.5 mg plus Metformin 500 mg Carbo/Taxol Everolimus plus exemestane  In October 2018 we began treatment with Lupron 11.25 mg IM every 3 months.  After 3 months she had a nice initial response based on MRI findings.  After 9 months she had a mixed response on MRI and rising tumor markers.   Review of Systems:10 point review of systems is negative as noted above.   Vitals: Blood pressure 127/70, pulse (!) 50, temperature (!) 97.4 F (36.3 C), temperature source Oral, resp. rate 18, height 5\' 6"  (1.676 m), weight 212 lb (96.2 kg), SpO2 99 %.  Physical Exam: General : The patient is a healthy woman in no acute distress.  HEENT: normocephalic, extraoccular movements normal; neck is supple without thyromegally  Lynphnodes: Supraclavicular and inguinal nodes not enlarged  Abdomen: Soft, no ascites, no organomegally, no masses, no hernias .   Pelvic:  EGBUS: Normal female  Vagina: Normal, no lesions  Urethra and Bladder: Normal, non-tender   Cervix: Surgically absent  Uterus: Surgically absent  Bi-manual examination: Non-tender; no adenxal masses or nodularity  Rectal: normal sphincter tone, no masses, no blood   Lower extremities: No edema or varicosities. Normal range of motion       Allergies  Allergen Reactions  . Codeine Nausea And Vomiting  . Sodium Thiosalicylate     UNSPECIFIED REACTION   . Ciprofloxacin Other (See Comments)    FATIGUE  . Codone [Hydrocodone Bitartrate] Itching  . Stadol [Butorphanol Tartrate] Other (See Comments)    ALTERED MENTAL STATUS  . Sulfa Antibiotics Rash    Past Medical History:  Diagnosis Date  . Anxiety   . Arthritis   . Cervical syndrome   . CKD (chronic kidney disease), stage III ([place])   . GERD (gastroesophageal reflux disease)   . Granulosa cell tumor of ovary   . History of hiatal hernia   . Obesity   . Ovarian cancer ([place])   . PONV (postoperative nausea and vomiting)    history of n/v,  past surgeries no n/v    Past Surgical History:  Procedure Laterality Date  . ABDOMINAL HYSTERECTOMY     TAH/BSO  . EXPLORATORY LAPAROTOMY     for bowel obstruction  . Secondary tumor debulking  2002  . Tumor debulking  2010  . VENTRAL HERNIA REPAIR      Current Outpatient Medications  Medication Sig Dispense Refill  . calcium carbonate (CALCIUM 600) 600 MG TABS tablet Take by mouth.    . escitalopram (LEXAPRO) 20 MG tablet Take 10 mg by mouth daily.     Marland Kitchen leuprolide (LUPRON) 7.5 MG injection Inject 7.5 mg into the muscle once.    . naproxen (NAPROSYN) 500 MG tablet Take by mouth.    Marland Kitchen omeprazole (PRILOSEC) 20 MG capsule Take 20 mg by mouth daily as needed (heartburn).     . polyethylene glycol (MIRALAX / GLYCOLAX) packet Take 17 g by mouth daily as needed for mild constipation.    . Probiotic Product (ALIGN PO) Take 1 tablet by mouth daily.      No current facility-administered medications for this visit.     Social History   Socioeconomic History  . Marital  status: Married    Spouse name: Not on file  . Number of children: Not on file  . Years of education: Not on file  . Highest education level: Not on file  Occupational History  . Not on file  Social Needs  . Financial resource strain: Not on file  . Food insecurity:    Worry: Not on file    Inability: Not on file  . Transportation needs:    Medical: Not on file    Non-medical: Not on file  Tobacco Use  . Smoking status: Never Smoker  . Smokeless tobacco: Never Used  Substance and Sexual Activity  . Alcohol use: Yes    Comment: One 8oz glass, socially  . Drug use: No  . Sexual activity: Not Currently  Lifestyle  . Physical activity:    Days per week: Not on file    Minutes per session: Not on  file  . Stress: Not on file  Relationships  . Social connections:    Talks on phone: Not on file    Gets together: Not on file    Attends religious service: Not on file    Active member of club or organization: Not on file    Attends meetings of clubs or organizations: Not on file    Relationship status: Not on file  . Intimate partner violence:    Fear of current or ex partner: Not on file    Emotionally abused: Not on file    Physically abused: Not on file    Forced sexual activity: Not on file  Other Topics Concern  . Not on file  Social History Narrative  . Not on file    Family History  Problem Relation Age of Onset  . Basal cell carcinoma Mother   . Stroke Father   . Colon cancer Father   . Colon cancer Paternal Uncle       [name], MD 12/02/2017, 9:20 AM

## 2017-12-02 NOTE — Progress Notes (Signed)
See new lupron order

## 2017-12-04 ENCOUNTER — Encounter: Payer: Self-pay | Admitting: Gynecology

## 2017-12-04 NOTE — Progress Notes (Signed)
No authorization required per patient insurance.  Your notification has been submitted and will be processed by [facility]. If you have questions, please be sure to use the number on the back of the members card to ensure you are directed appropriately  [place]

## 2017-12-10 ENCOUNTER — Inpatient Hospital Stay: Payer: 59

## 2017-12-10 DIAGNOSIS — C569 Malignant neoplasm of unspecified ovary: Secondary | ICD-10-CM | POA: Diagnosis not present

## 2017-12-10 DIAGNOSIS — Z79818 Long term (current) use of other agents affecting estrogen receptors and estrogen levels: Secondary | ICD-10-CM | POA: Diagnosis not present

## 2017-12-10 MED ORDER — LEUPROLIDE ACETATE (3 MONTH) 11.25 MG IM KIT
11.2500 mg | PACK | Freq: Once | INTRAMUSCULAR | Status: AC
  Administered 2017-12-10: 11.25 mg via INTRAMUSCULAR
  Filled 2017-12-10: qty 11.25

## 2017-12-13 DIAGNOSIS — G4733 Obstructive sleep apnea (adult) (pediatric): Secondary | ICD-10-CM | POA: Diagnosis not present

## 2017-12-23 MED FILL — OMEPRAZOLE 20 MG CPDR: 20 | 90 days supply | Qty: 90 | Fill #1

## 2017-12-23 MED FILL — ESCITALOPRAM 20 MG TABLET: 20 | 90 days supply | Qty: 90 | Fill #2

## 2018-01-13 DIAGNOSIS — G4733 Obstructive sleep apnea (adult) (pediatric): Secondary | ICD-10-CM | POA: Diagnosis not present

## 2018-01-20 DIAGNOSIS — H3509 Other intraretinal microvascular abnormalities: Secondary | ICD-10-CM | POA: Diagnosis not present

## 2018-01-20 DIAGNOSIS — H524 Presbyopia: Secondary | ICD-10-CM | POA: Diagnosis not present

## 2018-01-20 DIAGNOSIS — H2513 Age-related nuclear cataract, bilateral: Secondary | ICD-10-CM | POA: Diagnosis not present

## 2018-01-20 DIAGNOSIS — H25013 Cortical age-related cataract, bilateral: Secondary | ICD-10-CM | POA: Diagnosis not present

## 2018-01-20 DIAGNOSIS — H43812 Vitreous degeneration, left eye: Secondary | ICD-10-CM | POA: Diagnosis not present

## 2018-01-29 ENCOUNTER — Telehealth: Payer: Self-pay | Admitting: *Deleted

## 2018-01-29 NOTE — Telephone Encounter (Signed)
Scheduled the patient for a follow up appt for 11/12 at 8:30am to see Dr Fermin Schwab. Patient aware

## 2018-02-03 DIAGNOSIS — H169 Unspecified keratitis: Secondary | ICD-10-CM | POA: Diagnosis not present

## 2018-02-12 DIAGNOSIS — G4733 Obstructive sleep apnea (adult) (pediatric): Secondary | ICD-10-CM | POA: Diagnosis not present

## 2018-02-24 ENCOUNTER — Inpatient Hospital Stay: Payer: 59 | Attending: Gynecology

## 2018-02-24 DIAGNOSIS — Z79818 Long term (current) use of other agents affecting estrogen receptors and estrogen levels: Secondary | ICD-10-CM | POA: Diagnosis not present

## 2018-02-24 DIAGNOSIS — C569 Malignant neoplasm of unspecified ovary: Secondary | ICD-10-CM | POA: Insufficient documentation

## 2018-02-24 DIAGNOSIS — C786 Secondary malignant neoplasm of retroperitoneum and peritoneum: Secondary | ICD-10-CM | POA: Insufficient documentation

## 2018-02-26 LAB — INHIBIN B: INHIBIN B: 79.5 pg/mL — AB (ref 0.0–16.9)

## 2018-03-01 LAB — ANTI MULLERIAN HORMONE: ANTI-MULLERIAN HORMONE (AMH): 2.6 ng/mL

## 2018-03-03 ENCOUNTER — Inpatient Hospital Stay: Payer: 59

## 2018-03-03 ENCOUNTER — Encounter: Payer: Self-pay | Admitting: Gynecology

## 2018-03-03 ENCOUNTER — Inpatient Hospital Stay (HOSPITAL_BASED_OUTPATIENT_CLINIC_OR_DEPARTMENT_OTHER): Payer: 59 | Admitting: Gynecology

## 2018-03-03 VITALS — BP 115/73 | HR 50 | Temp 97.8°F | Resp 18 | Ht 66.0 in | Wt 215.0 lb

## 2018-03-03 DIAGNOSIS — C569 Malignant neoplasm of unspecified ovary: Secondary | ICD-10-CM

## 2018-03-03 DIAGNOSIS — C786 Secondary malignant neoplasm of retroperitoneum and peritoneum: Secondary | ICD-10-CM

## 2018-03-03 DIAGNOSIS — Z79818 Long term (current) use of other agents affecting estrogen receptors and estrogen levels: Secondary | ICD-10-CM | POA: Diagnosis not present

## 2018-03-03 MED ORDER — LEUPROLIDE ACETATE (3 MONTH) 11.25 MG IM KIT
11.2500 mg | PACK | Freq: Once | INTRAMUSCULAR | Status: AC
  Administered 2018-03-03: 11.25 mg via INTRAMUSCULAR
  Filled 2018-03-03: qty 11.25

## 2018-03-03 NOTE — Patient Instructions (Signed)
We will continue Lupron.  Return to see me in 3 months and we will obtain tumor markers prior to that visit.

## 2018-03-03 NOTE — Addendum Note (Signed)
Addended by: Joylene [name] D on: 03/03/2018 09:36 AM   Modules accepted: Orders

## 2018-03-03 NOTE — Progress Notes (Signed)
Consult Note: Gyn-Onc   [name] 62 y.o. female  Chief Complaint  Patient presents with  . 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  The patient remains entirely asymptomatic.  She is tolerating the Lupron well.  Plan:  I reviewed the patient's  tumor markers with her.   Given the downward trend of her tumor markers, she would like to continue Lupron for another 3 months.  Prior to her next visit in 3 months we will obtain an AMH and inhibin B.   Interval History: The patient returns today as previously scheduled for followup.  She is now received 12 months of Lupron.  Prior to today's visit tumor markers were reevaluated and both are trending downward.  Inhibin B is now 79.5 (previously 85.8) and AMH is 2.6 (previously 3.26). Overall, the patient is entirely asymptomatic and her functional status is excellent. 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 Her only new symptom is that of left sciatic pain which she will evaluate with her primary care physician.  Otherwise she specifically denies any GI, GU, or pelvic symptoms.  HPI:The patient has a long-standing history of a granulosa cell tumor of the ovary dating back to 2. She had a recurrence in 2002 and the upper abdomen which was resected and she was subsequently treated with 6 cycles of intraperitoneal cisplatin and etoposide completed in June of 2002. She was followed between 2002 in 2010 with CT scans. In November 2010 CT scan showed increased size of nodules in the pelvis. At that time she underwent exploratory laparotomy with resection of tumor nodules near the cecum and left pelvic sidewall.. The tumor was estrogen receptor negative and progesterone receptor positive. She alternated two-week courses of Megace and tamoxifen between June and December 2013. At  that time CT scan showed progressive disease and the patient was switched to letrozole.  In October 2017 the patient was found to have progressive disease on MRI and her management regimen was changed to tamoxifen 20 mg daily for 2 weeks alternating with Megace 40 mg 3 times a day for 2 weeks.  Follow-up MRI in May 2018 showed progressive disease with peritoneal implants near the liver and pelvis. In October 2018, the treatment was changed to Lupron 11. 2 5 mg q 3 months.   We requested a core biopsy for assessment of ER/PR and Foundation One testing.  Unfortunately, there was not enough tissue for Foundation One testing.  ER was 50% and PR 90%. Review of the literature seeking other treatments revealed possible alternative treatments including: Lupron Bevacizamab Letrozole 2.5 mg plus Metformin 500 mg Carbo/Taxol Everolimus plus exemestane  In October 2018 we began treatment with Lupron 11.25 mg IM every 3 months.  After 3 months she had a nice initial response based on MRI findings.  After 9 months she had a mixed response on MRI and rising tumor markers.   Review of Systems:10 point review of systems is negative as noted above.   Vitals: Blood pressure 115/73, pulse (!) 50, temperature 97.8 F (36.6 C), temperature source Oral, resp. rate 18, height 5\' 6"  (1.676 m), weight 215 lb (97.5 kg), SpO2 100 %.  Physical Exam: General : The patient is a healthy woman in no acute distress.  HEENT: normocephalic, extraoccular movements normal; neck is supple without thyromegally     Lower extremities:  No edema or varicosities. Normal range of motion       Allergies  Allergen Reactions  . Codeine Nausea And Vomiting  . Sodium Thiosalicylate     UNSPECIFIED REACTION   . Ciprofloxacin Other (See Comments)    FATIGUE  . Codone [Hydrocodone Bitartrate] Itching  . Levofloxacin Other (See Comments)  . Stadol [Butorphanol Tartrate] Other (See Comments)    ALTERED MENTAL STATUS  . Sulfa  Antibiotics Rash    Past Medical History:  Diagnosis Date  . Anxiety   . Arthritis   . Cervical syndrome   . CKD (chronic kidney disease), stage III ([place])   . GERD (gastroesophageal reflux disease)   . Granulosa cell tumor of ovary   . History of hiatal hernia   . Obesity   . Ovarian cancer ([place])   . PONV (postoperative nausea and vomiting)    history of n/v,  past surgeries no n/v    Past Surgical History:  Procedure Laterality Date  . ABDOMINAL HYSTERECTOMY     TAH/BSO  . EXPLORATORY LAPAROTOMY     for bowel obstruction  . Secondary tumor debulking  2002  . Tumor debulking  2010  . VENTRAL HERNIA REPAIR      Current Outpatient Medications  Medication Sig Dispense Refill  . calcium carbonate (CALCIUM 600) 600 MG TABS tablet Take by mouth.    . escitalopram (LEXAPRO) 20 MG tablet Take 10 mg by mouth daily.     Marland Kitchen leuprolide (LUPRON DEPOT, 60-MONTH,) 11.25 MG injection Inject into the muscle.    . Multiple Vitamin (MULTIVITAMIN) capsule Take 1 capsule by mouth daily.    . naproxen (NAPROSYN) 500 MG tablet Take by mouth.    Marland Kitchen omeprazole (PRILOSEC) 20 MG capsule Take 20 mg by mouth daily as needed (heartburn).     . polyethylene glycol (MIRALAX / GLYCOLAX) packet Take 17 g by mouth daily as needed for mild constipation.    . Probiotic Product (ALIGN PO) Take 1 tablet by mouth daily.      No current facility-administered medications for this visit.     Social History   Socioeconomic History  . Marital status: Married    Spouse name: Not on file  . Number of children: Not on file  . Years of education: Not on file  . Highest education level: Not on file  Occupational History  . Not on file  Social Needs  . Financial resource strain: Not on file  . Food insecurity:    Worry: Not on file    Inability: Not on file  . Transportation needs:    Medical: Not on file    Non-medical: Not on file  Tobacco Use  . Smoking status: Never Smoker  . Smokeless tobacco: Never  Used  Substance and Sexual Activity  . Alcohol use: Yes    Comment: One 8oz glass, socially  . Drug use: No  . Sexual activity: Not Currently  Lifestyle  . Physical activity:    Days per week: Not on file    Minutes per session: Not on file  . Stress: Not on file  Relationships  . Social connections:    Talks on phone: Not on file    Gets together: Not on file    Attends religious service: Not on file    Active member of club or organization: Not on file    Attends meetings of clubs or organizations: Not on file    Relationship status: Not on file  . Intimate  partner violence:    Fear of current or ex partner: Not on file    Emotionally abused: Not on file    Physically abused: Not on file    Forced sexual activity: Not on file  Other Topics Concern  . Not on file  Social History Narrative  . Not on file    Family History  Problem Relation Age of Onset  . Basal cell carcinoma Mother   . Stroke Father   . Colon cancer Father   . Colon cancer Paternal Uncle       [name], MD 03/03/2018, 8:56 AM

## 2018-03-15 DIAGNOSIS — G4733 Obstructive sleep apnea (adult) (pediatric): Secondary | ICD-10-CM | POA: Diagnosis not present

## 2018-03-16 MED FILL — OMEPRAZOLE 20 MG CPDR: 20 | 90 days supply | Qty: 90 | Fill #2

## 2018-03-18 ENCOUNTER — Ambulatory Visit: Payer: 59 | Admitting: Internal Medicine

## 2018-03-30 ENCOUNTER — Ambulatory Visit: Payer: 59 | Admitting: Internal Medicine

## 2018-04-03 ENCOUNTER — Other Ambulatory Visit: Payer: Self-pay | Admitting: Gynecologic Oncology

## 2018-04-03 DIAGNOSIS — C569 Malignant neoplasm of unspecified ovary: Secondary | ICD-10-CM

## 2018-04-03 NOTE — Progress Notes (Signed)
Patient has recurrent granulosa cell tumor of the ovary.  Last imaging in August.  Has been on Lupron therapy.  New nodules palpated on abdominal exam.  MRI abd/pelvis to re-evaluate the extent of disease and eval for progression on current therapy.

## 2018-04-06 MED FILL — ESCITALOPRAM 20 MG TABLET: 20 | 90 days supply | Qty: 90 | Fill #3

## 2018-04-09 ENCOUNTER — Encounter (INDEPENDENT_AMBULATORY_CARE_PROVIDER_SITE_OTHER): Payer: Self-pay

## 2018-04-10 ENCOUNTER — Inpatient Hospital Stay: Payer: 59 | Attending: Gynecology

## 2018-04-10 DIAGNOSIS — C569 Malignant neoplasm of unspecified ovary: Secondary | ICD-10-CM | POA: Diagnosis not present

## 2018-04-10 LAB — BASIC METABOLIC PANEL
Anion gap: 10 (ref 5–15)
BUN: 22 mg/dL (ref 8–23)
CO2: 25 mmol/L (ref 22–32)
Calcium: 9.2 mg/dL (ref 8.9–10.3)
Chloride: 106 mmol/L (ref 98–111)
Creatinine, Ser: 1.15 mg/dL — ABNORMAL HIGH (ref 0.44–1.00)
GFR calc non Af Amer: 51 mL/min — ABNORMAL LOW (ref >60)
GFR, EST AFRICAN AMERICAN: 59 mL/min — AB (ref >60)
Glucose, Bld: 110 mg/dL — ABNORMAL HIGH (ref 70–99)
POTASSIUM: 4.2 mmol/L (ref 3.5–5.1)
SODIUM: 141 mmol/L (ref 135–145)

## 2018-04-14 DIAGNOSIS — G4733 Obstructive sleep apnea (adult) (pediatric): Secondary | ICD-10-CM | POA: Diagnosis not present

## 2018-04-16 ENCOUNTER — Ambulatory Visit (HOSPITAL_COMMUNITY)
Admission: RE | Admit: 2018-04-16 | Discharge: 2018-04-16 | Disposition: A | Discharge status: Home / Self Care | Payer: 59 | Source: Ambulatory Visit | Attending: Gynecologic Oncology | Admitting: Gynecologic Oncology

## 2018-04-16 DIAGNOSIS — C7889 Secondary malignant neoplasm of other digestive organs: Secondary | ICD-10-CM | POA: Insufficient documentation

## 2018-04-16 DIAGNOSIS — C569 Malignant neoplasm of unspecified ovary: Secondary | ICD-10-CM

## 2018-04-16 DIAGNOSIS — C786 Secondary malignant neoplasm of retroperitoneum and peritoneum: Secondary | ICD-10-CM | POA: Insufficient documentation

## 2018-04-16 DIAGNOSIS — C787 Secondary malignant neoplasm of liver and intrahepatic bile duct: Secondary | ICD-10-CM | POA: Diagnosis not present

## 2018-04-16 MED ORDER — GADOBUTROL 1 MMOL/ML IV SOLN
10.0000 mL | Freq: Once | INTRAVENOUS | Status: AC | PRN
  Administered 2018-04-16: 9 mL via INTRAVENOUS

## 2018-04-29 ENCOUNTER — Encounter (INDEPENDENT_AMBULATORY_CARE_PROVIDER_SITE_OTHER): Payer: Self-pay | Admitting: Family Medicine

## 2018-04-29 ENCOUNTER — Ambulatory Visit (INDEPENDENT_AMBULATORY_CARE_PROVIDER_SITE_OTHER): Payer: 59 | Admitting: Family Medicine

## 2018-04-29 VITALS — BP 116/75 | HR 47 | Temp 97.8°F | Ht 66.0 in | Wt 209.0 lb

## 2018-04-29 DIAGNOSIS — Z1331 Encounter for screening for depression: Secondary | ICD-10-CM

## 2018-04-29 DIAGNOSIS — R5383 Other fatigue: Secondary | ICD-10-CM

## 2018-04-29 DIAGNOSIS — R739 Hyperglycemia, unspecified: Secondary | ICD-10-CM | POA: Diagnosis not present

## 2018-04-29 DIAGNOSIS — R0602 Shortness of breath: Secondary | ICD-10-CM

## 2018-04-29 DIAGNOSIS — Z0289 Encounter for other administrative examinations: Secondary | ICD-10-CM

## 2018-04-29 DIAGNOSIS — E669 Obesity, unspecified: Secondary | ICD-10-CM

## 2018-04-29 DIAGNOSIS — Z9189 Other specified personal risk factors, not elsewhere classified: Secondary | ICD-10-CM | POA: Diagnosis not present

## 2018-04-29 DIAGNOSIS — Z6833 Body mass index (BMI) 33.0-33.9, adult: Secondary | ICD-10-CM | POA: Diagnosis not present

## 2018-04-29 NOTE — Progress Notes (Signed)
Office: [phone]  /  Fax: [phone]   Dear Dr. [name],   Thank you for referring [name] to our clinic. The following note includes my evaluation and treatment recommendations.  HPI:   Chief Complaint: OBESITY    [name] has been referred by Dr. [name] for consultation regarding her obesity and obesity related comorbidities.    [name] (MR# [id number]) is a 63 y.o. female who presents on 04/29/2018 for obesity evaluation and treatment. Current BMI is Body mass index is 33.73 kg/m.  [name] has been struggling with her weight for many years and has been unsuccessful in either losing weight, maintaining weight loss, or reaching her healthy weight goal.     [name] attended our information session and states she is currently in the action stage of change and ready to dedicate time achieving and maintaining a healthier weight. [name] is interested in becoming our patient and working on intensive lifestyle modifications including (but not limited to) diet, exercise and weight loss.  [name] states her family eats meals together she thinks her family will eat healthier with her her desired weight loss is 69 lbs she started gaining weight after childbirth her heaviest weight ever was 230 lbs. she has significant food cravings issues  she skips lunch sometimes she is frequently drinking liquids with calories she frequently eats larger portions than normal  she has binge eating behaviors she struggles with emotional eating    [name] feels her energy is lower than it should be. This has worsened with weight gain and has not worsened recently. [name] denies daytime somnolence and admits to waking up refreshed with CPAP. Patient has a history of obstructive sleep apnea.  Patient generally gets 8 hours of sleep per night, and states they generally have generally restful sleep. Snoring is not present with CPAP. Apneic episodes are not present with CPAP. Epworth  Sleepiness Score is 8.  Dyspnea on exertion [name] notes increasing shortness of breath with exercising and seems to be worsening over time with weight gain. She notes getting out of breath sooner with activity than she used to. This has not gotten worse recently. [name] denies orthopnea.  Hyperglycemia [name] has a history of some elevated blood glucose readings without a diagnosis of diabetes in Epic. She admits to polyphagia.  At risk for diabetes [name] is at higher than average risk for developing diabetes due to her hyperglycemia and obesity. She currently denies polyuria or polydipsia.  Depression Screen [name] (modified PHQ-9) score was 5. Depression screen PHQ 2/9 04/29/2018  Decreased Interest 1  Down, Depressed, Hopeless 1  PHQ - 2 Score 2  Altered sleeping 0  Tired, decreased energy 1  Change in appetite 1  Feeling bad or failure about yourself  1  Trouble concentrating 0  Moving slowly or fidgety/restless 0  Suicidal thoughts 0  PHQ-9 Score 5  Difficult doing work/chores Not difficult at all    ASSESSMENT AND PLAN:  Other Jacqueline - Plan: EKG 12-Lead, Vitamin B12, CBC With Differential, Folate, Lipid Panel With LDL/HDL Ratio, T3, T4, free, TSH, VITAMIN D 25 Hydroxy (Vit-D Deficiency, Fractures)  Shortness of breath on exertion  Hyperglycemia - Plan: Comprehensive metabolic panel, Hemoglobin A1c, Insulin, random  Depression screening  At risk for diabetes mellitus  Class 1 obesity with serious comorbidity and body mass index (BMI) of 33.0 to 33.9 in adult, unspecified obesity type  PLAN:  Jacqueline [name] was informed that her Jacqueline may be  related to obesity, depression or many other causes. Labs will be ordered, and in the meanwhile [name] has agreed to work on diet, exercise and weight loss to help with Jacqueline. Proper sleep hygiene was discussed including the need for 7-8 hours of quality sleep each night. A sleep study was not ordered based on  symptoms and Epworth score. An EKG and an indirect calorimetry was ordered today. [name] will follow up in 2 weeks.  Dyspnea on exertion [name]'s shortness of breath appears to be obesity related and exercise induced. She has agreed to work on weight loss and gradually increase exercise to treat her exercise induced shortness of breath. If Neya follows our instructions and loses weight without improvement of her shortness of breath, we will plan to refer to pulmonology. We will monitor this condition regularly. Labs, an indirect calorimetry, and an EKG were ordered today. [name] agrees to this plan.  Hyperglycemia Fasting labs will be obtained and results with be discussed with [name] in 2 weeks at her follow up visit. In the meanwhile [name] was started on a lower simple carbohydrate diet and will work on weight loss efforts.  Diabetes risk counseling [name] was given extended (15 minutes) diabetes prevention counseling today. She is 63 y.o. female and has risk factors for diabetes including hyperglycemia and obesity. We discussed intensive lifestyle modifications today with an emphasis on weight loss as well as increasing exercise and decreasing simple carbohydrates in her diet.  Depression Screen [name] had a mildly positive depression screening. Depression is commonly associated with obesity and often results in emotional eating behaviors. We will monitor this closely and work on CBT to help improve the non-hunger eating patterns. Referral to Psychology may be required if no improvement is seen as she continues in our clinic.  Obesity [name] is currently in the action stage of change and her goal is to continue with weight loss efforts. I recommend [name] begin the structured treatment plan as follows:  She has agreed to follow the Category 2 plan + 100 calories. [name] has been instructed to eventually work up to a goal of 150 minutes of combined cardio and strengthening exercise per week for weight  loss and overall health benefits. We discussed the following Behavioral Modification Strategies today: increasing lean protein intake, decreasing simple carbohydrates, and work on meal planning and easy cooking plans   She was informed of the importance of frequent follow up visits to maximize her success with intensive lifestyle modifications for her multiple health conditions. She was informed we would discuss her lab results at her next visit unless there is a critical issue that needs to be addressed sooner. [name] agreed to keep her next visit at the agreed upon time to discuss these results.  ALLERGIES: Allergies  Allergen Reactions  . Codeine Nausea And Vomiting  . Sodium Thiosalicylate     UNSPECIFIED REACTION   . Ciprofloxacin Other (See Comments)    Jacqueline  . Codone [Hydrocodone Bitartrate] Itching  . Daypro [Oxaprozin] Rash  . Levofloxacin Other (See Comments)  . Stadol [Butorphanol Tartrate] Other (See Comments)    ALTERED MENTAL STATUS  . Sulfa Antibiotics Rash    MEDICATIONS: Current Outpatient Medications on File Prior to Visit  Medication Sig Dispense Refill  . calcium carbonate (CALCIUM 600) 600 MG TABS tablet Take by mouth.    . docusate sodium (COLACE) 100 MG capsule Take 200 mg by mouth daily as needed for mild constipation.    Marland Kitchen escitalopram (LEXAPRO) 20 MG tablet Take  10 mg by mouth daily.     Marland Kitchen leuprolide (LUPRON DEPOT, 34-MONTH,) 11.25 MG injection Inject into the muscle.    . Multiple Vitamin (MULTIVITAMIN) capsule Take 1 capsule by mouth daily.    Marland Kitchen omeprazole (PRILOSEC) 20 MG capsule Take 20 mg by mouth daily as needed (heartburn).     . Probiotic Product (ALIGN PO) Take 1 tablet by mouth daily.      No current facility-administered medications on file prior to visit.     PAST MEDICAL HISTORY: Past Medical History:  Diagnosis Date  . Anxiety   . Arthritis   . Cervical syndrome   . CKD (chronic kidney disease), stage III ([place])   . Constipation   .  Fatty liver   . GERD (gastroesophageal reflux disease)   . Granulosa cell tumor of ovary   . History of hiatal hernia   . Joint pain   . Obesity   . Osteoarthritis   . Ovarian cancer ([place])   . PONV (postoperative nausea and vomiting)    history of n/v,  past surgeries no n/v  . Sleep apnea     PAST SURGICAL HISTORY: Past Surgical History:  Procedure Laterality Date  . ABDOMINAL HYSTERECTOMY     TAH/BSO  . APPENDECTOMY    . EXPLORATORY LAPAROTOMY     for bowel obstruction  . Secondary tumor debulking  2002  . SHOULDER SURGERY     2017 left  . Tumor debulking  2010  . VENTRAL HERNIA REPAIR      SOCIAL HISTORY: Social History   Tobacco Use  . Smoking status: Never Smoker  . Smokeless tobacco: Never Used  Substance Use Topics  . Alcohol use: Yes    Comment: One 8oz glass, socially  . Drug use: No    FAMILY HISTORY: Family History  Problem Relation Age of Onset  . Basal cell carcinoma Mother   . Thyroid disease Mother   . Stroke Father   . Colon cancer Father   . Hypertension Father   . Heart disease Father   . Colon cancer Paternal Uncle     ROS: Review of Systems  HENT: Positive for hearing loss.        Positive for stuffiness.  Eyes:       Wears glasses and contacts. Positive for floaters.  Cardiovascular: Negative for orthopnea.  Genitourinary:       Negative for polyuria.  Musculoskeletal: Positive for back pain and joint pain.       Positive for muscle pain.  Endo/Heme/Allergies: Negative for polydipsia.       Positive for hyperglycemia. Positive for polyphagia.    PHYSICAL EXAM: Blood pressure 116/75, pulse (!) 47, temperature 97.8 F (36.6 C), temperature source Oral, height 5\' 6"  (1.676 m), weight 209 lb (94.8 kg), SpO2 98 %. Body mass index is 33.73 kg/m. Physical Exam Vitals signs reviewed.  Constitutional:      Appearance: Normal appearance. She is obese.  HENT:     Head: Normocephalic and atraumatic.     Nose: Nose normal.    Eyes:     General: No scleral icterus.    Extraocular Movements: Extraocular movements intact.  Neck:     Musculoskeletal: Normal range of motion and neck supple.     Comments: Negative for thyromegaly. Cardiovascular:     Rate and Rhythm: Bradycardia present.     Comments: Sinus Pulmonary:     Effort: Pulmonary effort is normal. No respiratory distress.  Abdominal:  Palpations: Abdomen is soft.     Tenderness: There is no abdominal tenderness.     Comments: Positive for obesity.  Musculoskeletal:     Comments: ROM normal in all extremities.  Skin:    General: Skin is warm and dry.  Neurological:     Mental Status: She is alert and oriented to person, place, and time.     Coordination: Coordination normal.  Psychiatric:        Mosley and Affect: Mosley normal.        Behavior: Behavior normal.     RECENT LABS AND TESTS: BMET    Component Value Date/Time   NA 141 04/10/2018 1308   NA 141 09/24/2013 1131   K 4.2 04/10/2018 1308   K 4.5 09/24/2013 1131   CL 106 04/10/2018 1308   CO2 25 04/10/2018 1308   CO2 24 09/24/2013 1131   GLUCOSE 110 (H) 04/10/2018 1308   GLUCOSE 90 09/24/2013 1131   BUN 22 04/10/2018 1308   BUN 21.2 09/24/2013 1131   CREATININE 1.15 (H) 04/10/2018 1308   CREATININE 1.1 09/24/2013 1131   CALCIUM 9.2 04/10/2018 1308   CALCIUM 9.5 09/24/2013 1131   GFRNONAA 51 (L) 04/10/2018 1308   GFRAA 59 (L) 04/10/2018 1308   No results found for: HGBA1C No results found for: INSULIN CBC    Component Value Date/Time   WBC 6.1 10/04/2016 0713   RBC 4.55 10/04/2016 0713   HGB 14.4 10/04/2016 0713   HCT 40.7 10/04/2016 0713   PLT 268 10/04/2016 0713   MCV 89.5 10/04/2016 0713   MCH 31.6 10/04/2016 0713   MCHC 35.4 10/04/2016 0713   RDW 11.9 10/04/2016 0713   LYMPHSABS 1.8 04/13/2009 1005   MONOABS 0.5 04/13/2009 1005   EOSABS 0.1 04/13/2009 1005   BASOSABS 0.0 04/13/2009 1005   Iron/TIBC/Ferritin/ %Sat No results found for: IRON, TIBC, FERRITIN,  IRONPCTSAT Lipid Panel  No results found for: CHOL, TRIG, HDL, CHOLHDL, VLDL, LDLCALC, LDLDIRECT Hepatic Function Panel     Component Value Date/Time   PROT 6.8 09/24/2013 1131   ALBUMIN 3.9 09/24/2013 1131   AST 23 09/24/2013 1131   ALT 27 09/24/2013 1131   ALKPHOS 81 09/24/2013 1131   BILITOT 0.53 09/24/2013 1131   No results found for: TSH  ECG  shows NSR with a rate of 50 BPM. INDIRECT CALORIMETER done today shows a VO2 of 201 and a REE of 1400.  Her calculated basal metabolic rate is 6270 thus her basal metabolic rate is worse than expected.  OBESITY BEHAVIORAL INTERVENTION VISIT  Today's visit was # 1   Starting weight: 209 lbs Starting date: 04/29/18 Today's weight : Weight: 209 lb (94.8 kg)  Today's date: 04/29/2018 Total lbs lost to date: 0  ASK: We discussed the diagnosis of obesity with [name] today and Kiyah agreed to give [name] permission to discuss obesity behavioral modification therapy today.  ASSESS: [name] has the diagnosis of obesity and her BMI today is 33.7. [name] is in the action stage of change.   ADVISE: [name] was educated on the multiple health risks of obesity as well as the benefit of weight loss to improve her health. She was advised of the need for long term treatment and the importance of lifestyle modifications to improve her current health and to decrease her risk of future health problems.  AGREE: Multiple dietary modification options and treatment options were discussed and Marciel agreed to follow the recommendations documented in the above note.  ARRANGE: Keirra was educated on the importance of frequent visits to treat obesity as outlined per CMS and USPSTF guidelines and agreed to schedule her next follow up appointment today.  I, Marcille Blanco, am acting as transcriptionist for Starlyn Skeans, MD  I have reviewed the above documentation for accuracy and completeness, and I agree with the above. -Dennard Nip, MD

## 2018-04-30 LAB — COMPREHENSIVE METABOLIC PANEL
ALT: 26 IU/L (ref 0–32)
AST: 23 IU/L (ref 0–40)
Albumin/Globulin Ratio: 1.8 (ref 1.2–2.2)
Albumin: 4.2 g/dL (ref 3.6–4.8)
Alkaline Phosphatase: 91 IU/L (ref 39–117)
BUN/Creatinine Ratio: 19 (ref 12–28)
BUN: 20 mg/dL (ref 8–27)
Bilirubin Total: 0.3 mg/dL (ref 0.0–1.2)
CALCIUM: 9.5 mg/dL (ref 8.7–10.3)
CO2: 24 mmol/L (ref 20–29)
Chloride: 101 mmol/L (ref 96–106)
Creatinine, Ser: 1.04 mg/dL — ABNORMAL HIGH (ref 0.57–1.00)
GFR calc Af Amer: 67 mL/min/{1.73_m2} (ref >59)
GFR, EST NON AFRICAN AMERICAN: 58 mL/min/{1.73_m2} — AB (ref >59)
Globulin, Total: 2.4 g/dL (ref 1.5–4.5)
Glucose: 94 mg/dL (ref 65–99)
Potassium: 4.7 mmol/L (ref 3.5–5.2)
Sodium: 141 mmol/L (ref 134–144)
Total Protein: 6.6 g/dL (ref 6.0–8.5)

## 2018-04-30 LAB — LIPID PANEL WITH LDL/HDL RATIO
Cholesterol, Total: 214 mg/dL — ABNORMAL HIGH (ref 100–199)
HDL: 57 mg/dL (ref >39)
LDL Calculated: 137 mg/dL — ABNORMAL HIGH (ref 0–99)
LDl/HDL Ratio: 2.4 ratio (ref 0.0–3.2)
Triglycerides: 98 mg/dL (ref 0–149)
VLDL Cholesterol Cal: 20 mg/dL (ref 5–40)

## 2018-04-30 LAB — CBC WITH DIFFERENTIAL
BASOS: 1 %
Basophils Absolute: 0 10*3/uL (ref 0.0–0.2)
EOS (ABSOLUTE): 0.2 10*3/uL (ref 0.0–0.4)
Eos: 3 %
Hematocrit: 42.5 % (ref 34.0–46.6)
Hemoglobin: 14.5 g/dL (ref 11.1–15.9)
Immature Grans (Abs): 0 10*3/uL (ref 0.0–0.1)
Immature Granulocytes: 0 %
Lymphocytes Absolute: 1.4 10*3/uL (ref 0.7–3.1)
Lymphs: 27 %
MCH: 31 pg (ref 26.6–33.0)
MCHC: 34.1 g/dL (ref 31.5–35.7)
MCV: 91 fL (ref 79–97)
Monocytes Absolute: 0.4 10*3/uL (ref 0.1–0.9)
Monocytes: 8 %
Neutrophils Absolute: 3.2 10*3/uL (ref 1.4–7.0)
Neutrophils: 61 %
RBC: 4.68 x10E6/uL (ref 3.77–5.28)
RDW: 12.3 % (ref 11.7–15.4)
WBC: 5.3 10*3/uL (ref 3.4–10.8)

## 2018-04-30 LAB — TSH: TSH: 0.564 u[IU]/mL (ref 0.450–4.500)

## 2018-04-30 LAB — INSULIN, RANDOM: INSULIN: 25.8 u[IU]/mL — ABNORMAL HIGH (ref 2.6–24.9)

## 2018-04-30 LAB — HEMOGLOBIN A1C
ESTIMATED AVERAGE GLUCOSE: 111 mg/dL
Hgb A1c MFr Bld: 5.5 % (ref 4.8–5.6)

## 2018-04-30 LAB — VITAMIN D 25 HYDROXY (VIT D DEFICIENCY, FRACTURES): Vit D, 25-Hydroxy: 29.7 ng/mL — ABNORMAL LOW (ref 30.0–100.0)

## 2018-04-30 LAB — FOLATE: Folate: >20 ng/mL (ref >3.0)

## 2018-04-30 LAB — T4, FREE: Free T4: 0.98 ng/dL (ref 0.82–1.77)

## 2018-04-30 LAB — T3: T3, Total: 108 ng/dL (ref 71–180)

## 2018-04-30 LAB — VITAMIN B12: Vitamin B-12: 529 pg/mL (ref 232–1245)

## 2018-05-07 ENCOUNTER — Other Ambulatory Visit: Payer: Self-pay | Admitting: Internal Medicine

## 2018-05-07 ENCOUNTER — Ambulatory Visit
Admission: RE | Admit: 2018-05-07 | Discharge: 2018-05-07 | Disposition: A | Discharge status: Home / Self Care | Payer: 59 | Source: Ambulatory Visit | Attending: Internal Medicine | Admitting: Internal Medicine

## 2018-05-07 DIAGNOSIS — M5489 Other dorsalgia: Secondary | ICD-10-CM

## 2018-05-07 DIAGNOSIS — M47816 Spondylosis without myelopathy or radiculopathy, lumbar region: Secondary | ICD-10-CM | POA: Diagnosis not present

## 2018-05-07 DIAGNOSIS — M545 Low back pain: Secondary | ICD-10-CM | POA: Diagnosis not present

## 2018-05-07 DIAGNOSIS — M25552 Pain in left hip: Secondary | ICD-10-CM | POA: Diagnosis not present

## 2018-05-07 DIAGNOSIS — M1612 Unilateral primary osteoarthritis, left hip: Secondary | ICD-10-CM | POA: Diagnosis not present

## 2018-05-07 DIAGNOSIS — G8929 Other chronic pain: Secondary | ICD-10-CM | POA: Diagnosis not present

## 2018-05-13 ENCOUNTER — Ambulatory Visit (INDEPENDENT_AMBULATORY_CARE_PROVIDER_SITE_OTHER): Payer: 59 | Admitting: Family Medicine

## 2018-05-13 VITALS — BP 95/59 | HR 56 | Temp 97.7°F | Ht 66.0 in | Wt 210.0 lb

## 2018-05-13 DIAGNOSIS — E559 Vitamin D deficiency, unspecified: Secondary | ICD-10-CM

## 2018-05-13 DIAGNOSIS — E669 Obesity, unspecified: Secondary | ICD-10-CM | POA: Diagnosis not present

## 2018-05-13 DIAGNOSIS — Z9189 Other specified personal risk factors, not elsewhere classified: Secondary | ICD-10-CM | POA: Diagnosis not present

## 2018-05-13 DIAGNOSIS — E8881 Metabolic syndrome: Secondary | ICD-10-CM

## 2018-05-13 DIAGNOSIS — E88819 Insulin resistance, unspecified: Secondary | ICD-10-CM

## 2018-05-13 DIAGNOSIS — Z6833 Body mass index (BMI) 33.0-33.9, adult: Secondary | ICD-10-CM | POA: Diagnosis not present

## 2018-05-13 MED ORDER — VITAMIN D (ERGOCALCIFEROL) 1.25 MG (50000 UNIT) PO CAPS: 50000.0000 [IU] | ORAL_CAPSULE | ORAL | 0 refills | Status: DC

## 2018-05-13 MED ORDER — METFORMIN HCL 500 MG PO TABS: 500.0000 mg | ORAL_TABLET | Freq: Every day | ORAL | 0 refills | Status: DC

## 2018-05-13 MED FILL — metFORMIN HCL 500 MG TABS: 500 | 30 days supply | Qty: 30 | Fill #0

## 2018-05-13 MED FILL — VIT D2 1.25 MG (50,000 UNIT: 1.25 MG | 28 days supply | Qty: 4 | Fill #0

## 2018-05-14 NOTE — Progress Notes (Signed)
Office: [phone]  /  Fax: [phone]   HPI:   Chief Complaint: OBESITY [name] is here to discuss her progress with her obesity treatment plan. She is on the Category 2 plan + 100 calories and is following her eating plan approximately 75 % of the time. She states she is on the treadmill for 30 minutes 3 times per week. [name] struggled to follow her plan closely due to excessive hunger and some cravings.  Her weight is 210 lb (95.3 kg) today and has gained 1 pound since her last visit. She has lost 0 lbs since starting treatment with [name].  Vitamin D Deficiency [name] has a new diagnosis of vitamin D deficiency. She is on OTC multivitamins, but level is still low. She notes fatigue and denies nausea, vomiting or muscle weakness.  Insulin Resistance [name] has a new diagnosis of insulin resistance based on her elevated fasting insulin level >5. Her glucose and A1c are normal but fasting insulin is elevated. Although [name] blood glucose readings are still under good control, insulin resistance puts her at greater risk of metabolic syndrome and diabetes. She is not taking metformin currently and notes polyphagia but denies hypoglycemia. She continues to work on diet and exercise to decrease risk of diabetes.  At risk for diabetes [name] is at higher than average risk for developing diabetes due to her obesity and insulin resistance. She currently denies polyuria or polydipsia.  ASSESSMENT AND PLAN:  Vitamin D deficiency - Plan: Vitamin D, Ergocalciferol, (DRISDOL) 1.25 MG (50000 UT) CAPS capsule  Insulin resistance - Plan: metFORMIN (GLUCOPHAGE) 500 MG tablet  At risk for diabetes mellitus  Class 1 obesity with serious comorbidity and body mass index (BMI) of 33.0 to 33.9 in adult, unspecified obesity type  PLAN:  Vitamin D Deficiency [name] was informed that low vitamin D levels contributes to fatigue and are associated with obesity, breast, and colon cancer. [name] agrees to  continue taking OTC multivitamins, and she agrees to start prescription Vit D @50 ,000 IU every week #4 with no refills. She will follow up for routine testing of vitamin D, at least 2-3 times per year. She was informed of the risk of over-replacement of vitamin D and agrees to not increase her dose unless she discusses this with [name] first. [name] agrees to follow up with our clinic in 2 weeks with [name], [name].  Insulin Resistance [name] will continue to work on weight loss, exercise, and decreasing simple carbohydrates in her diet to help decrease the risk of diabetes. We dicussed metformin including benefits and risks. She was informed that eating too many simple carbohydrates or too many calories at one sitting increases the likelihood of GI side effects. [name] agrees to start metformin 500 mg q AM #30 with no refills. [name] agrees to follow up with our clinic in 2 weeks with [name], [name] as directed to monitor her progress.  Diabetes risk counselling [name] was given extended (30 minutes) diabetes prevention counseling today. She is 63 y.o. female and has risk factors for diabetes including obesity and insulin resistance. We discussed intensive lifestyle modifications today with an emphasis on weight loss as well as increasing exercise and decreasing simple carbohydrates in her diet.  Obesity [name] is currently in the action stage of change. As such, her goal is to continue with weight loss efforts She has agreed to follow the Category 2 plan + 100 calories [name] has been instructed to work up to a goal of 150 minutes of combined  cardio and strengthening exercise per week for weight loss and overall health benefits. We discussed the following Behavioral Modification Strategies today: increasing lean protein intake, decreasing simple carbohydrates , work on meal planning and easy cooking plans and dealing with family or coworker sabotage   [name] has agreed to follow up with our clinic in 2  weeks with [name], [name]. She was informed of the importance of frequent follow up visits to maximize her success with intensive lifestyle modifications for her multiple health conditions.  ALLERGIES: Allergies  Allergen Reactions  . Codeine Nausea And Vomiting  . Sodium Thiosalicylate     UNSPECIFIED REACTION   . Ciprofloxacin Other (See Comments)    FATIGUE  . Codone [Hydrocodone Bitartrate] Itching  . Daypro [Oxaprozin] Rash  . Levofloxacin Other (See Comments)  . Stadol [Butorphanol Tartrate] Other (See Comments)    ALTERED MENTAL STATUS  . Sulfa Antibiotics Rash    MEDICATIONS: Current Outpatient Medications on File Prior to Visit  Medication Sig Dispense Refill  . calcium carbonate (CALCIUM 600) 600 MG TABS tablet Take by mouth.    . docusate sodium (COLACE) 100 MG capsule Take 200 mg by mouth daily as needed for mild constipation.    Marland Kitchen escitalopram (LEXAPRO) 20 MG tablet Take 10 mg by mouth daily.     Marland Kitchen leuprolide (LUPRON DEPOT, 35-MONTH,) 11.25 MG injection Inject into the muscle.    . Multiple Vitamin (MULTIVITAMIN) capsule Take 1 capsule by mouth daily.    Marland Kitchen omeprazole (PRILOSEC) 20 MG capsule Take 20 mg by mouth daily as needed (heartburn).     . Probiotic Product (ALIGN PO) Take 1 tablet by mouth daily.      No current facility-administered medications on file prior to visit.     PAST MEDICAL HISTORY: Past Medical History:  Diagnosis Date  . Anxiety   . Arthritis   . Cervical syndrome   . CKD (chronic kidney disease), stage III ([place])   . Constipation   . Fatty liver   . GERD (gastroesophageal reflux disease)   . Granulosa cell tumor of ovary   . History of hiatal hernia   . Joint pain   . Obesity   . Osteoarthritis   . Ovarian cancer ([place])   . PONV (postoperative nausea and vomiting)    history of n/v,  past surgeries no n/v  . Sleep apnea     PAST SURGICAL HISTORY: Past Surgical History:  Procedure Laterality Date  . ABDOMINAL HYSTERECTOMY      TAH/BSO  . APPENDECTOMY    . EXPLORATORY LAPAROTOMY     for bowel obstruction  . Secondary tumor debulking  2002  . SHOULDER SURGERY     2017 left  . Tumor debulking  2010  . VENTRAL HERNIA REPAIR      SOCIAL HISTORY: Social History   Tobacco Use  . Smoking status: Never Smoker  . Smokeless tobacco: Never Used  Substance Use Topics  . Alcohol use: Yes    Comment: One 8oz glass, socially  . Drug use: No    FAMILY HISTORY: Family History  Problem Relation Age of Onset  . Basal cell carcinoma Mother   . Thyroid disease Mother   . Stroke Father   . Colon cancer Father   . Hypertension Father   . Heart disease Father   . Colon cancer Paternal Uncle     ROS: Review of Systems  Constitutional: Positive for malaise/fatigue. Negative for weight loss.  Gastrointestinal: Negative for nausea and vomiting.  Genitourinary: Negative for frequency.  Musculoskeletal:       Negative muscle weakness  Endo/Heme/Allergies: Negative for polydipsia.       Positive polyphagia Negative hypoglycemia    PHYSICAL EXAM: Blood pressure (!) 95/59, pulse (!) 56, temperature 97.7 F (36.5 C), temperature source Oral, height 5\' 6"  (1.676 m), weight 210 lb (95.3 kg), SpO2 97 %. Body mass index is 33.89 kg/m. Physical Exam Vitals signs reviewed.  Constitutional:      Appearance: Normal appearance. She is obese.  Cardiovascular:     Rate and Rhythm: Normal rate.     Pulses: Normal pulses.  Pulmonary:     Effort: Pulmonary effort is normal.     Breath sounds: Normal breath sounds.  Musculoskeletal: Normal range of motion.  Skin:    General: Skin is warm and dry.  Neurological:     Mental Status: She is alert and oriented to person, place, and time.  Psychiatric:        Mood and Affect: Mood normal.        Behavior: Behavior normal.     RECENT LABS AND TESTS: BMET    Component Value Date/Time   NA 141 04/29/2018 1009   NA 141 09/24/2013 1131   K 4.7 04/29/2018 1009   K 4.5  09/24/2013 1131   CL 101 04/29/2018 1009   CO2 24 04/29/2018 1009   CO2 24 09/24/2013 1131   GLUCOSE 94 04/29/2018 1009   GLUCOSE 110 (H) 04/10/2018 1308   GLUCOSE 90 09/24/2013 1131   BUN 20 04/29/2018 1009   BUN 21.2 09/24/2013 1131   CREATININE 1.04 (H) 04/29/2018 1009   CREATININE 1.1 09/24/2013 1131   CALCIUM 9.5 04/29/2018 1009   CALCIUM 9.5 09/24/2013 1131   GFRNONAA 58 (L) 04/29/2018 1009   GFRAA 67 04/29/2018 1009   Lab Results  Component Value Date   HGBA1C 5.5 04/29/2018   Lab Results  Component Value Date   INSULIN 25.8 (H) 04/29/2018   CBC    Component Value Date/Time   WBC 5.3 04/29/2018 1009   WBC 6.1 10/04/2016 0713   RBC 4.68 04/29/2018 1009   RBC 4.55 10/04/2016 0713   HGB 14.5 04/29/2018 1009   HCT 42.5 04/29/2018 1009   PLT 268 10/04/2016 0713   MCV 91 04/29/2018 1009   MCH 31.0 04/29/2018 1009   MCH 31.6 10/04/2016 0713   MCHC 34.1 04/29/2018 1009   MCHC 35.4 10/04/2016 0713   RDW 12.3 04/29/2018 1009   LYMPHSABS 1.4 04/29/2018 1009   MONOABS 0.5 04/13/2009 1005   EOSABS 0.2 04/29/2018 1009   BASOSABS 0.0 04/29/2018 1009   Iron/TIBC/Ferritin/ %Sat No results found for: IRON, TIBC, FERRITIN, IRONPCTSAT Lipid Panel     Component Value Date/Time   CHOL 214 (H) 04/29/2018 1009   TRIG 98 04/29/2018 1009   HDL 57 04/29/2018 1009   LDLCALC 137 (H) 04/29/2018 1009   Hepatic Function Panel     Component Value Date/Time   PROT 6.6 04/29/2018 1009   PROT 6.8 09/24/2013 1131   ALBUMIN 4.2 04/29/2018 1009   ALBUMIN 3.9 09/24/2013 1131   AST 23 04/29/2018 1009   AST 23 09/24/2013 1131   ALT 26 04/29/2018 1009   ALT 27 09/24/2013 1131   ALKPHOS 91 04/29/2018 1009   ALKPHOS 81 09/24/2013 1131   BILITOT 0.3 04/29/2018 1009   BILITOT 0.53 09/24/2013 1131      Component Value Date/Time   TSH 0.564 04/29/2018 1009      OBESITY  BEHAVIORAL INTERVENTION VISIT  Today's visit was # 2   Starting weight: 209 lbs Starting date:  04/29/2018 Today's weight : 210 lbs  Today's date: 05/13/2018 Total lbs lost to date: 0    ASK: We discussed the diagnosis of obesity with [name] today and Dora agreed to give [name] permission to discuss obesity behavioral modification therapy today.  ASSESS: Abigail has the diagnosis of obesity and her BMI today is 33.91 Taraann is in the action stage of change   ADVISE: Laketra was educated on the multiple health risks of obesity as well as the benefit of weight loss to improve her health. She was advised of the need for long term treatment and the importance of lifestyle modifications to improve her current health and to decrease her risk of future health problems.  AGREE: Multiple dietary modification options and treatment options were discussed and  Birdell agreed to follow the recommendations documented in the above note.  ARRANGE: [name] was educated on the importance of frequent visits to treat obesity as outlined per CMS and USPSTF guidelines and agreed to schedule her next follow up appointment today.  I, Trixie Dredge, am acting as transcriptionist for Dennard Nip, MD  I have reviewed the above documentation for accuracy and completeness, and I agree with the above. -Dennard Nip, MD

## 2018-05-15 DIAGNOSIS — G4733 Obstructive sleep apnea (adult) (pediatric): Secondary | ICD-10-CM | POA: Diagnosis not present

## 2018-05-15 DIAGNOSIS — M2022 Hallux rigidus, left foot: Secondary | ICD-10-CM | POA: Diagnosis not present

## 2018-05-15 DIAGNOSIS — M79672 Pain in left foot: Secondary | ICD-10-CM | POA: Diagnosis not present

## 2018-05-25 ENCOUNTER — Encounter (INDEPENDENT_AMBULATORY_CARE_PROVIDER_SITE_OTHER): Payer: Self-pay | Admitting: Family Medicine

## 2018-05-25 ENCOUNTER — Ambulatory Visit (INDEPENDENT_AMBULATORY_CARE_PROVIDER_SITE_OTHER): Payer: 59 | Admitting: Family Medicine

## 2018-05-25 VITALS — BP 116/72 | HR 63 | Temp 97.7°F | Ht 66.0 in | Wt 208.0 lb

## 2018-05-25 DIAGNOSIS — E559 Vitamin D deficiency, unspecified: Secondary | ICD-10-CM

## 2018-05-25 DIAGNOSIS — Z9189 Other specified personal risk factors, not elsewhere classified: Secondary | ICD-10-CM

## 2018-05-25 DIAGNOSIS — Z1231 Encounter for screening mammogram for malignant neoplasm of breast: Secondary | ICD-10-CM | POA: Diagnosis not present

## 2018-05-25 DIAGNOSIS — E88819 Insulin resistance, unspecified: Secondary | ICD-10-CM

## 2018-05-25 DIAGNOSIS — Z6833 Body mass index (BMI) 33.0-33.9, adult: Secondary | ICD-10-CM

## 2018-05-25 DIAGNOSIS — E8881 Metabolic syndrome: Secondary | ICD-10-CM

## 2018-05-25 DIAGNOSIS — Z6834 Body mass index (BMI) 34.0-34.9, adult: Secondary | ICD-10-CM | POA: Diagnosis not present

## 2018-05-25 DIAGNOSIS — Z01419 Encounter for gynecological examination (general) (routine) without abnormal findings: Secondary | ICD-10-CM | POA: Diagnosis not present

## 2018-05-25 DIAGNOSIS — E66811 Obesity, class 1: Secondary | ICD-10-CM

## 2018-05-25 DIAGNOSIS — E669 Obesity, unspecified: Secondary | ICD-10-CM | POA: Insufficient documentation

## 2018-05-25 MED ORDER — METFORMIN HCL 500 MG PO TABS: 500.0000 mg | ORAL_TABLET | Freq: Every day | ORAL | 0 refills | Status: DC

## 2018-05-25 MED ORDER — VITAMIN D (ERGOCALCIFEROL) 1.25 MG (50000 UNIT) PO CAPS: 50000.0000 [IU] | ORAL_CAPSULE | ORAL | 0 refills | Status: DC

## 2018-05-25 NOTE — Progress Notes (Signed)
Office: [phone]  /  Fax: [phone]   HPI:   Chief Complaint: OBESITY [name] is here to discuss her progress with her obesity treatment plan. She is on the Category 2 plan + 100 calories and is following her eating plan approximately 75% of the time. She states she is on the treadmill for 30 minutes 2 times per week. [name] likes the food and eats all the food on the plan most days. She does not always get a meal break at work. She is an Therapist, sports.  Her weight is 208 lb (94.3 kg) today and has had a weight loss of 2 pounds over a period of 1 to 2 weeks since her last visit. She has lost 1 lb since starting treatment with [name].  Insulin Resistance [name] has a diagnosis of insulin resistance based on her elevated fasting insulin level >5. Although [name]'s blood glucose readings are still under good control, insulin resistance puts her at greater risk of metabolic syndrome and diabetes. She is on metformin and denies polyphagia. She notes rare diarrhea, not sure if it is related to metformin. She states metformin helps with polyphagia. She continues to work on diet and exercise to decrease risk of diabetes.  Vitamin D deficiency [name] has a diagnosis of vitamin D deficiency. She is currently taking prescription Vit D, but level is not at goal. Last Vitamin D level was 29.7. She denies nausea, vomiting or muscle weakness.  At risk for osteopenia and osteoporosis [name] is at higher risk of osteopenia and osteoporosis due to vitamin D deficiency.   ALLERGIES: Allergies  Allergen Reactions  . Codeine Nausea And Vomiting  . Sodium Thiosalicylate     UNSPECIFIED REACTION   . Ciprofloxacin Other (See Comments)    FATIGUE  . Codone [Hydrocodone Bitartrate] Itching  . Daypro [Oxaprozin] Rash  . Levofloxacin Other (See Comments)  . Stadol [Butorphanol Tartrate] Other (See Comments)    ALTERED MENTAL STATUS  . Sulfa Antibiotics Rash    MEDICATIONS: Current Outpatient Medications on File Prior  to Visit  Medication Sig Dispense Refill  . calcium carbonate (CALCIUM 600) 600 MG TABS tablet Take by mouth.    . docusate sodium (COLACE) 100 MG capsule Take 200 mg by mouth daily as needed for mild constipation.    Marland Kitchen escitalopram (LEXAPRO) 20 MG tablet Take 10 mg by mouth daily.     Marland Kitchen leuprolide (LUPRON DEPOT, 30-MONTH,) 11.25 MG injection Inject into the muscle.    . Multiple Vitamin (MULTIVITAMIN) capsule Take 1 capsule by mouth daily.    Marland Kitchen omeprazole (PRILOSEC) 20 MG capsule Take 20 mg by mouth daily as needed (heartburn).     . Probiotic Product (ALIGN PO) Take 1 tablet by mouth daily.      No current facility-administered medications on file prior to visit.     PAST MEDICAL HISTORY: Past Medical History:  Diagnosis Date  . Anxiety   . Arthritis   . Cervical syndrome   . CKD (chronic kidney disease), stage III ([place])   . Constipation   . Fatty liver   . GERD (gastroesophageal reflux disease)   . Granulosa cell tumor of ovary   . History of hiatal hernia   . Joint pain   . Obesity   . Osteoarthritis   . Ovarian cancer ([place])   . PONV (postoperative nausea and vomiting)    history of n/v,  past surgeries no n/v  . Sleep apnea     PAST SURGICAL HISTORY: Past Surgical History:  Procedure Laterality Date  . ABDOMINAL HYSTERECTOMY     TAH/BSO  . APPENDECTOMY    . EXPLORATORY LAPAROTOMY     for bowel obstruction  . Secondary tumor debulking  2002  . SHOULDER SURGERY     2017 left  . Tumor debulking  2010  . VENTRAL HERNIA REPAIR      SOCIAL HISTORY: Social History   Tobacco Use  . Smoking status: Never Smoker  . Smokeless tobacco: Never Used  Substance Use Topics  . Alcohol use: Yes    Comment: One 8oz glass, socially  . Drug use: No    FAMILY HISTORY: Family History  Problem Relation Age of Onset  . Basal cell carcinoma Mother   . Thyroid disease Mother   . Stroke Father   . Colon cancer Father   . Hypertension Father   . Heart disease Father   .  Colon cancer Paternal Uncle     ROS: Review of Systems  Constitutional: Positive for weight loss.  Gastrointestinal: Negative for nausea and vomiting.  Musculoskeletal:       Negative muscle weakness  Endo/Heme/Allergies:       Negative polyphagia    PHYSICAL EXAM: Blood pressure 116/72, pulse 63, temperature 97.7 F (36.5 C), temperature source Oral, height 5\' 6"  (1.676 m), weight 208 lb (94.3 kg), SpO2 96 %. Body mass index is 33.57 kg/m. Physical Exam Vitals signs reviewed.  Constitutional:      Appearance: Normal appearance. She is obese.  Cardiovascular:     Rate and Rhythm: Normal rate.     Pulses: Normal pulses.  Pulmonary:     Effort: Pulmonary effort is normal.     Breath sounds: Normal breath sounds.  Musculoskeletal: Normal range of motion.  Skin:    General: Skin is warm and dry.  Neurological:     Mental Status: She is alert and oriented to person, place, and time.  Psychiatric:        Mood and Affect: Mood normal.        Behavior: Behavior normal.     RECENT LABS AND TESTS: BMET    Component Value Date/Time   NA 141 04/29/2018 1009   NA 141 09/24/2013 1131   K 4.7 04/29/2018 1009   K 4.5 09/24/2013 1131   CL 101 04/29/2018 1009   CO2 24 04/29/2018 1009   CO2 24 09/24/2013 1131   GLUCOSE 94 04/29/2018 1009   GLUCOSE 110 (H) 04/10/2018 1308   GLUCOSE 90 09/24/2013 1131   BUN 20 04/29/2018 1009   BUN 21.2 09/24/2013 1131   CREATININE 1.04 (H) 04/29/2018 1009   CREATININE 1.1 09/24/2013 1131   CALCIUM 9.5 04/29/2018 1009   CALCIUM 9.5 09/24/2013 1131   GFRNONAA 58 (L) 04/29/2018 1009   GFRAA 67 04/29/2018 1009   Lab Results  Component Value Date   HGBA1C 5.5 04/29/2018   Lab Results  Component Value Date   INSULIN 25.8 (H) 04/29/2018   CBC    Component Value Date/Time   WBC 5.3 04/29/2018 1009   WBC 6.1 10/04/2016 0713   RBC 4.68 04/29/2018 1009   RBC 4.55 10/04/2016 0713   HGB 14.5 04/29/2018 1009   HCT 42.5 04/29/2018 1009    PLT 268 10/04/2016 0713   MCV 91 04/29/2018 1009   MCH 31.0 04/29/2018 1009   MCH 31.6 10/04/2016 0713   MCHC 34.1 04/29/2018 1009   MCHC 35.4 10/04/2016 0713   RDW 12.3 04/29/2018 1009   LYMPHSABS 1.4 04/29/2018 1009  MONOABS 0.5 04/13/2009 1005   EOSABS 0.2 04/29/2018 1009   BASOSABS 0.0 04/29/2018 1009   Iron/TIBC/Ferritin/ %Sat No results found for: IRON, TIBC, FERRITIN, IRONPCTSAT Lipid Panel     Component Value Date/Time   CHOL 214 (H) 04/29/2018 1009   TRIG 98 04/29/2018 1009   HDL 57 04/29/2018 1009   LDLCALC 137 (H) 04/29/2018 1009   Hepatic Function Panel     Component Value Date/Time   PROT 6.6 04/29/2018 1009   PROT 6.8 09/24/2013 1131   ALBUMIN 4.2 04/29/2018 1009   ALBUMIN 3.9 09/24/2013 1131   AST 23 04/29/2018 1009   AST 23 09/24/2013 1131   ALT 26 04/29/2018 1009   ALT 27 09/24/2013 1131   ALKPHOS 91 04/29/2018 1009   ALKPHOS 81 09/24/2013 1131   BILITOT 0.3 04/29/2018 1009   BILITOT 0.53 09/24/2013 1131      Component Value Date/Time   TSH 0.564 04/29/2018 1009    ASSESSMENT AND PLAN: Insulin resistance - Plan: metFORMIN (GLUCOPHAGE) 500 MG tablet  Vitamin D deficiency - Plan: Vitamin D, Ergocalciferol, (DRISDOL) 1.25 MG (50000 UT) CAPS capsule  At risk for osteoporosis  Class 1 obesity with serious comorbidity and body mass index (BMI) of 33.0 to 33.9 in adult, unspecified obesity type  PLAN:  Insulin Resistance Sharee will continue to work on weight loss, exercise, and decreasing simple carbohydrates in her diet to help decrease the risk of diabetes.  Ohana agrees to continue taking metformin 500 mg q AM #30 and we will refill for one month. Mackinzee agreed to follow up with our clinic in two weeks as directed to monitor her progress.  Vitamin D Deficiency [name] was informed that low vitamin D levels contributes to fatigue and are associated with obesity, breast, and colon cancer. [name] agrees to continue taking prescription Vit D @50 ,000 IU  every week #4 and we will refill for one month. She will follow up for routine testing of vitamin D, at least 2-3 times per year. She was informed of the risk of over-replacement of vitamin D and agrees to not increase her dose unless she discusses this with [name] first. [name] agrees to follow-up with our clinic in two weeks.  At risk for osteopenia and osteoporosis [name] was given extended  (15 minutes) osteoporosis prevention counseling today. [name] is at risk for osteopenia and osteoporsis due to her vitamin D deficiency. She was encouraged to take her vitamin D and follow her higher calcium diet and increase strengthening exercise to help strengthen her bones and decrease her risk of osteopenia and osteoporosis.  Obesity [name] is currently in the action stage of change. As such, her goal is to continue with weight loss efforts She has agreed to follow the Category 2 plan + 100 calories [name] will continue current exercise regimen for weight loss and overall health benefits. We discussed the following Behavioral Modification Strategies today: increasing lean protein intake, better snacking choices, and planning for success.   [name] has agreed to follow up with our clinic in 2 weeks. She was informed of the importance of frequent follow up visits to maximize her success with intensive lifestyle modifications for her multiple health conditions.   OBESITY BEHAVIORAL INTERVENTION VISIT  Today's visit was # 3  Starting weight: 209 lbs  Starting date: 04/29/2018 Today's weight : 208 lbs   Today's date: 05/25/2018 Total lbs lost to date: 1    ASK: We discussed the diagnosis of obesity with [name] today and [name] agreed  to give [name] permission to discuss obesity behavioral modification therapy today.  ASSESS: [name] has the diagnosis of obesity and her BMI today is 33.59 [name] is in the action stage of change   ADVISE: [name] was educated on the multiple health risks of obesity as well as  the benefit of weight loss to improve her health. She was advised of the need for long term treatment and the importance of lifestyle modifications to improve her current health and to decrease her risk of future health problems.  AGREE: Multiple dietary modification options and treatment options were discussed and  [name] agreed to follow the recommendations documented in the above note.  ARRANGE: [name] was educated on the importance of frequent visits to treat obesity as outlined per CMS and USPSTF guidelines and agreed to schedule her next follow up appointment today.  Llana Aliment, am acting as Location manager for [employer], FNP-C.  I have reviewed the above documentation for accuracy and completeness, and I agree with the above.  - Anysha Frappier, FNP-C.

## 2018-05-26 ENCOUNTER — Inpatient Hospital Stay: Payer: 59 | Attending: Gynecology

## 2018-05-26 DIAGNOSIS — Z90722 Acquired absence of ovaries, bilateral: Secondary | ICD-10-CM | POA: Insufficient documentation

## 2018-05-26 DIAGNOSIS — C569 Malignant neoplasm of unspecified ovary: Secondary | ICD-10-CM | POA: Insufficient documentation

## 2018-05-26 DIAGNOSIS — Z90710 Acquired absence of both cervix and uterus: Secondary | ICD-10-CM | POA: Insufficient documentation

## 2018-05-26 DIAGNOSIS — R300 Dysuria: Secondary | ICD-10-CM | POA: Insufficient documentation

## 2018-05-26 DIAGNOSIS — Z79818 Long term (current) use of other agents affecting estrogen receptors and estrogen levels: Secondary | ICD-10-CM | POA: Insufficient documentation

## 2018-06-01 ENCOUNTER — Inpatient Hospital Stay: Payer: 59

## 2018-06-01 DIAGNOSIS — Z90722 Acquired absence of ovaries, bilateral: Secondary | ICD-10-CM | POA: Diagnosis not present

## 2018-06-01 DIAGNOSIS — Z79818 Long term (current) use of other agents affecting estrogen receptors and estrogen levels: Secondary | ICD-10-CM | POA: Diagnosis not present

## 2018-06-01 DIAGNOSIS — R300 Dysuria: Secondary | ICD-10-CM | POA: Diagnosis not present

## 2018-06-01 DIAGNOSIS — C569 Malignant neoplasm of unspecified ovary: Secondary | ICD-10-CM | POA: Diagnosis not present

## 2018-06-01 DIAGNOSIS — Z90710 Acquired absence of both cervix and uterus: Secondary | ICD-10-CM | POA: Diagnosis not present

## 2018-06-02 ENCOUNTER — Encounter: Payer: Self-pay | Admitting: Gynecology

## 2018-06-02 ENCOUNTER — Inpatient Hospital Stay: Payer: 59

## 2018-06-02 ENCOUNTER — Inpatient Hospital Stay (HOSPITAL_BASED_OUTPATIENT_CLINIC_OR_DEPARTMENT_OTHER): Payer: 59 | Admitting: Gynecology

## 2018-06-02 VITALS — BP 130/57 | HR 60 | Temp 97.7°F | Resp 18 | Ht 66.0 in | Wt 210.0 lb

## 2018-06-02 DIAGNOSIS — R300 Dysuria: Secondary | ICD-10-CM

## 2018-06-02 DIAGNOSIS — Z79818 Long term (current) use of other agents affecting estrogen receptors and estrogen levels: Secondary | ICD-10-CM

## 2018-06-02 DIAGNOSIS — Z90710 Acquired absence of both cervix and uterus: Secondary | ICD-10-CM | POA: Diagnosis not present

## 2018-06-02 DIAGNOSIS — C569 Malignant neoplasm of unspecified ovary: Secondary | ICD-10-CM

## 2018-06-02 DIAGNOSIS — Z90722 Acquired absence of ovaries, bilateral: Secondary | ICD-10-CM

## 2018-06-02 LAB — URINALYSIS, COMPLETE (UACMP) WITH MICROSCOPIC
BILIRUBIN URINE: NEGATIVE
Glucose, UA: NEGATIVE mg/dL
Hgb urine dipstick: NEGATIVE
Ketones, ur: NEGATIVE mg/dL
Nitrite: NEGATIVE
Protein, ur: NEGATIVE mg/dL
Specific Gravity, Urine: 1.017 (ref 1.005–1.030)
pH: 5 (ref 5.0–8.0)

## 2018-06-02 NOTE — Patient Instructions (Signed)
We will contact you with the results of your urine sample.  We will contact you when your tumor markers are available.  If stable or decreased, we will plan with proceeding with lupron injection.  If increased, we will inform you of next steps per Dr. Fermin Schwab.  Please call for any needs or concerns.

## 2018-06-02 NOTE — Progress Notes (Signed)
Consult Note: Gyn-Onc   [name] 63 y.o. female  No chief complaint on file.   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  She has been on Lupron for 15 months and tolerating it well.  Left flank pain and dysuria suspicious for urinary tract infection.  The patient remains entirely asymptomatic.   She is recently placed on metformin for weight control.   Plan:   Tumor markers are not available today.  Once they are back we will make decisions as to whether we continue Lupron or have any other imaging.   Interval History: The patient returns today as previously scheduled for followup.  She has now received 15 months of Lupron and is tolerating it well.  An MRI was obtained in late December 2019 as a patient felt that there was a somewhat tender mass in the left upper quadrant.  That revealed stable intraperitoneal disease and no mass in the subcutaneous abdominal wall.  Patient today complains of some left flank pain.  She has no fever chills.  She does note some dysuria.  She has had some other back and hip pain and is scheduled to have orthopedic consultation in the near future.  From the perspective of her granulosa cell tumor, overall, the patient is entirely asymptomatic and her functional status is excellent. 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 Otherwise she specifically denies any GI, GU, or pelvic symptoms.  HPI:The patient has a long-standing history of a granulosa cell tumor of the ovary dating back to 34. She had a recurrence in 2002 and the upper abdomen which was resected and she was subsequently treated with 6 cycles of intraperitoneal cisplatin and etoposide completed in June of 2002. She was followed between 2002 in 2010 with CT scans. In November 2010 CT scan showed increased size of nodules in the pelvis. At that time she underwent exploratory laparotomy  with resection of tumor nodules near the cecum and left pelvic sidewall.. The tumor was estrogen receptor negative and progesterone receptor positive. She alternated two-week courses of Megace and tamoxifen between June and December 2013. At that time CT scan showed progressive disease and the patient was switched to letrozole.  In October 2017 the patient was found to have progressive disease on MRI and her management regimen was changed to tamoxifen 20 mg daily for 2 weeks alternating with Megace 40 mg 3 times a day for 2 weeks.  Follow-up MRI in May 2018 showed progressive disease with peritoneal implants near the liver and pelvis. In October 2018, the treatment was changed to Lupron 11. 2 5 mg q 3 months.   We requested a core biopsy for assessment of ER/PR and Foundation One testing.  Unfortunately, there was not enough tissue for Foundation One testing.  ER was 50% and PR 90%. Review of the literature seeking other treatments revealed possible alternative treatments including: Lupron Bevacizamab Letrozole 2.5 mg plus Metformin 500 mg Carbo/Taxol Everolimus plus exemestane  In October 2018 we began treatment with Lupron 11.25 mg IM every 3 months.  After 3 months she had a nice initial response based on MRI findings.  After 9 months she had a mixed response on MRI and rising tumor markers.   Review of Systems:10 point review of systems is negative as noted above.   Vitals: There were no vitals taken for this visit.  Physical Exam: General : The patient is a  healthy woman in no acute distress.  HEENT: normocephalic, extraoccular movements normal; neck is supple without thyromegally  Abdomen: Soft nontender no mass organomegaly are noted.  Palpation specifically in the left upper quadrant does not reveal any mass.    Lower extremities: No edema or varicosities. Normal range of motion       Allergies  Allergen Reactions  . Codeine Nausea And Vomiting  . Sodium Thiosalicylate      UNSPECIFIED REACTION   . Ciprofloxacin Other (See Comments)    FATIGUE  . Codone [Hydrocodone Bitartrate] Itching  . Daypro [Oxaprozin] Rash  . Levofloxacin Other (See Comments)  . Stadol [Butorphanol Tartrate] Other (See Comments)    ALTERED MENTAL STATUS  . Sulfa Antibiotics Rash    Past Medical History:  Diagnosis Date  . Anxiety   . Arthritis   . Cervical syndrome   . CKD (chronic kidney disease), stage III ([place])   . Constipation   . Fatty liver   . GERD (gastroesophageal reflux disease)   . Granulosa cell tumor of ovary   . History of hiatal hernia   . Joint pain   . Obesity   . Osteoarthritis   . Ovarian cancer ([place])   . PONV (postoperative nausea and vomiting)    history of n/v,  past surgeries no n/v  . Sleep apnea     Past Surgical History:  Procedure Laterality Date  . ABDOMINAL HYSTERECTOMY     TAH/BSO  . APPENDECTOMY    . EXPLORATORY LAPAROTOMY     for bowel obstruction  . Secondary tumor debulking  2002  . SHOULDER SURGERY     2017 left  . Tumor debulking  2010  . VENTRAL HERNIA REPAIR      Current Outpatient Medications  Medication Sig Dispense Refill  . calcium carbonate (CALCIUM 600) 600 MG TABS tablet Take by mouth.    . docusate sodium (COLACE) 100 MG capsule Take 200 mg by mouth daily as needed for mild constipation.    Marland Kitchen escitalopram (LEXAPRO) 20 MG tablet Take 10 mg by mouth daily.     Marland Kitchen leuprolide (LUPRON DEPOT, 32-MONTH,) 11.25 MG injection Inject into the muscle.    . metFORMIN (GLUCOPHAGE) 500 MG tablet Take 1 tablet (500 mg total) by mouth daily with breakfast. 30 tablet 0  . Multiple Vitamin (MULTIVITAMIN) capsule Take 1 capsule by mouth daily.    Marland Kitchen omeprazole (PRILOSEC) 20 MG capsule Take 20 mg by mouth daily as needed (heartburn).     . Probiotic Product (ALIGN PO) Take 1 tablet by mouth daily.     . Vitamin D, Ergocalciferol, (DRISDOL) 1.25 MG (50000 UT) CAPS capsule Take 1 capsule (50,000 Units total) by mouth every 7 (seven)  days. 4 capsule 0   No current facility-administered medications for this visit.     Social History   Socioeconomic History  . Marital status: Married    Spouse name: [place] Natarajan  . Number of children: 2  . Years of education: Not on file  . Highest education level: Not on file  Occupational History  . Occupation: Therapist, sports  Social Needs  . Financial resource strain: Not on file  . Food insecurity:    Worry: Not on file    Inability: Not on file  . Transportation needs:    Medical: Not on file    Non-medical: Not on file  Tobacco Use  . Smoking status: Never Smoker  . Smokeless tobacco: Never Used  Substance and Sexual Activity  .  Alcohol use: Yes    Comment: One 8oz glass, socially  . Drug use: No  . Sexual activity: Not Currently  Lifestyle  . Physical activity:    Days per week: Not on file    Minutes per session: Not on file  . Stress: Not on file  Relationships  . Social connections:    Talks on phone: Not on file    Gets together: Not on file    Attends religious service: Not on file    Active member of club or organization: Not on file    Attends meetings of clubs or organizations: Not on file    Relationship status: Not on file  . Intimate partner violence:    Fear of current or ex partner: Not on file    Emotionally abused: Not on file    Physically abused: Not on file    Forced sexual activity: Not on file  Other Topics Concern  . Not on file  Social History Narrative  . Not on file    Family History  Problem Relation Age of Onset  . Basal cell carcinoma Mother   . Thyroid disease Mother   . Stroke Father   . Colon cancer Father   . Hypertension Father   . Heart disease Father   . Colon cancer Paternal Uncle       [name], MD 06/02/2018, 8:49 AM

## 2018-06-03 LAB — URINE CULTURE: Culture: NO GROWTH

## 2018-06-04 LAB — INHIBIN B: Inhibin B: 70 pg/mL — ABNORMAL HIGH (ref 0.0–16.9)

## 2018-06-08 ENCOUNTER — Ambulatory Visit (INDEPENDENT_AMBULATORY_CARE_PROVIDER_SITE_OTHER): Payer: 59 | Admitting: Family Medicine

## 2018-06-08 ENCOUNTER — Other Ambulatory Visit: Payer: Self-pay | Admitting: Gynecologic Oncology

## 2018-06-08 ENCOUNTER — Encounter (INDEPENDENT_AMBULATORY_CARE_PROVIDER_SITE_OTHER): Payer: Self-pay | Admitting: Family Medicine

## 2018-06-08 VITALS — BP 99/67 | HR 51 | Temp 97.8°F | Ht 66.0 in | Wt 205.0 lb

## 2018-06-08 DIAGNOSIS — Z6833 Body mass index (BMI) 33.0-33.9, adult: Secondary | ICD-10-CM

## 2018-06-08 DIAGNOSIS — E8881 Metabolic syndrome: Secondary | ICD-10-CM | POA: Diagnosis not present

## 2018-06-08 DIAGNOSIS — E669 Obesity, unspecified: Secondary | ICD-10-CM | POA: Diagnosis not present

## 2018-06-08 DIAGNOSIS — C569 Malignant neoplasm of unspecified ovary: Secondary | ICD-10-CM

## 2018-06-08 LAB — ANTI MULLERIAN HORMONE: ANTI-MULLERIAN HORMONE (AMH): 3.84 ng/mL

## 2018-06-08 NOTE — Progress Notes (Signed)
Lupron injection per Dr. C-P 

## 2018-06-08 NOTE — Progress Notes (Signed)
Plan to repeat labs with imaging 3 months after lupron injection scheduled for tomorrow per Dr. Judeth Porch

## 2018-06-09 ENCOUNTER — Encounter (INDEPENDENT_AMBULATORY_CARE_PROVIDER_SITE_OTHER): Payer: Self-pay | Admitting: Family Medicine

## 2018-06-09 ENCOUNTER — Inpatient Hospital Stay: Payer: 59

## 2018-06-09 DIAGNOSIS — Z90722 Acquired absence of ovaries, bilateral: Secondary | ICD-10-CM | POA: Diagnosis not present

## 2018-06-09 DIAGNOSIS — Z90710 Acquired absence of both cervix and uterus: Secondary | ICD-10-CM | POA: Diagnosis not present

## 2018-06-09 DIAGNOSIS — C569 Malignant neoplasm of unspecified ovary: Secondary | ICD-10-CM | POA: Diagnosis not present

## 2018-06-09 DIAGNOSIS — Z79818 Long term (current) use of other agents affecting estrogen receptors and estrogen levels: Secondary | ICD-10-CM | POA: Diagnosis not present

## 2018-06-09 DIAGNOSIS — R300 Dysuria: Secondary | ICD-10-CM | POA: Diagnosis not present

## 2018-06-09 MED ORDER — LEUPROLIDE ACETATE (3 MONTH) 11.25 MG IM KIT
11.2500 mg | PACK | Freq: Once | INTRAMUSCULAR | Status: AC
  Administered 2018-06-09: 11.25 mg via INTRAMUSCULAR
  Filled 2018-06-09: qty 11.25

## 2018-06-09 MED FILL — NAPROXEN 500 MG TABLET: 500 | 30 days supply | Qty: 60 | Fill #0

## 2018-06-09 NOTE — Progress Notes (Signed)
Office: [phone]  /  Fax: [phone]   HPI:   Chief Complaint: OBESITY [name] is here to discuss her progress with her obesity treatment plan. She is on the follow the Category 2 plan + 100 calories and is following her eating plan approximately 90 % of the time. She states she is exercising on treadmill 30 minutes 1 times per week. [name] stuck to plan very well except for one indulgent meal. Her weight is 205 lb (93 kg) today and has had a weight loss of 3 pounds over a period of 2 weeks since her last visit. She has lost 4 lbs since starting treatment with [name].  Insulin Resistance [name] has a diagnosis of insulin resistance based on her elevated fasting insulin level >5. Although [name]'s blood glucose readings are still under good control, insulin resistance puts her at greater risk of metabolic syndrome and diabetes. She is taking metformin currently and denies diarrhea. [name] continues to work on diet and exercise to decrease risk of diabetes. Lab Results  Component Value Date   HGBA1C 5.5 04/29/2018    ASSESSMENT AND PLAN:  Insulin resistance  Class 1 obesity with serious comorbidity and body mass index (BMI) of 33.0 to 33.9 in adult, unspecified obesity type  PLAN:  Insulin Resistance [name] will continue to work on weight loss, exercise, and decreasing simple carbohydrates in her diet to help decrease the risk of diabetes.[name] will continue taking metformin for now and prescription was not written today. [name] agrees to follow up with our clinic in 2 weeks.  I spent > than 50% of the 15 minute visit on counseling as documented in the note.  Obesity [name] is currently in the action stage of change. As such, her goal is to continue with weight loss efforts She has agreed to follow the Category 2 plan + 100 calories + breakfast options [name] has been instructed to continue with current exercise regimen. We discussed the following Behavioral Modification Strategies today:  increasing lean protein intake, work on meal planning and easy cooking plans, Better snacking choices and planning for success Protein and carb exchanges were discussed. [name] has agreed to follow up with our clinic in 2 weeks. She was informed of the importance of frequent follow up visits to maximize her success with intensive lifestyle modifications for her multiple health conditions.  ALLERGIES: Allergies  Allergen Reactions  . Codeine Nausea And Vomiting  . Sodium Thiosalicylate     UNSPECIFIED REACTION   . Ciprofloxacin Other (See Comments)    FATIGUE  . Codone [Hydrocodone Bitartrate] Itching  . Daypro [Oxaprozin] Rash  . Levofloxacin Other (See Comments)  . Stadol [Butorphanol Tartrate] Other (See Comments)    ALTERED MENTAL STATUS  . Sulfa Antibiotics Rash    MEDICATIONS: Current Outpatient Medications on File Prior to Visit  Medication Sig Dispense Refill  . calcium carbonate (CALCIUM 600) 600 MG TABS tablet Take by mouth.    . Diclofenac Sodium (PENNSAID) 2 % SOLN [place]aid 20 mg/gram/actuation (2 %) topical soln in metered-dose pump  APPLY 2 PUMPS (40 MG) TO THE AFFECTED AREA BY TOPICAL ROUTE 2 TIMES PER DAY    . docusate sodium (COLACE) 100 MG capsule Take 200 mg by mouth daily as needed for mild constipation.    Marland Kitchen escitalopram (LEXAPRO) 20 MG tablet Take 10 mg by mouth daily.     Marland Kitchen leuprolide (LUPRON DEPOT, 3-MONTH,) 11.25 MG injection Inject into the muscle.    . metFORMIN (GLUCOPHAGE) 500 MG tablet Take 1  tablet (500 mg total) by mouth daily with breakfast. 30 tablet 0  . Multiple Vitamin (MULTIVITAMIN) capsule Take 1 capsule by mouth daily.    Marland Kitchen omeprazole (PRILOSEC) 20 MG capsule Take 20 mg by mouth daily as needed (heartburn).     . Probiotic Product (ALIGN PO) Take 1 tablet by mouth daily.     . Vitamin D, Ergocalciferol, (DRISDOL) 1.25 MG (50000 UT) CAPS capsule Take 1 capsule (50,000 Units total) by mouth every 7 (seven) days. 4 capsule 0   No current  facility-administered medications on file prior to visit.     PAST MEDICAL HISTORY: Past Medical History:  Diagnosis Date  . Anxiety   . Arthritis   . Cervical syndrome   . CKD (chronic kidney disease), stage III ([place])   . Constipation   . Fatty liver   . GERD (gastroesophageal reflux disease)   . Granulosa cell tumor of ovary   . History of hiatal hernia   . Joint pain   . Obesity   . Osteoarthritis   . Ovarian cancer ([place])   . PONV (postoperative nausea and vomiting)    history of n/v,  past surgeries no n/v  . Sleep apnea     PAST SURGICAL HISTORY: Past Surgical History:  Procedure Laterality Date  . ABDOMINAL HYSTERECTOMY     TAH/BSO  . APPENDECTOMY    . EXPLORATORY LAPAROTOMY     for bowel obstruction  . Secondary tumor debulking  2002  . SHOULDER SURGERY     2017 left  . Tumor debulking  2010  . VENTRAL HERNIA REPAIR      SOCIAL HISTORY: Social History   Tobacco Use  . Smoking status: Never Smoker  . Smokeless tobacco: Never Used  Substance Use Topics  . Alcohol use: Yes    Comment: One 8oz glass, socially  . Drug use: No    FAMILY HISTORY: Family History  Problem Relation Age of Onset  . Basal cell carcinoma Mother   . Thyroid disease Mother   . Stroke Father   . Colon cancer Father   . Hypertension Father   . Heart disease Father   . Colon cancer Paternal Uncle     ROS: Review of Systems  Constitutional: Positive for weight loss.  Genitourinary:       Negative for polyuria  Endo/Heme/Allergies:       Negative for polyphagia    PHYSICAL EXAM: Blood pressure 99/67, pulse (!) 51, temperature 97.8 F (36.6 C), height 5\' 6"  (1.676 m), weight 205 lb (93 kg), SpO2 97 %. Body mass index is 33.09 kg/m. Physical Exam Vitals signs reviewed.  Constitutional:      Appearance: Normal appearance. She is obese.  Cardiovascular:     Rate and Rhythm: Normal rate.     Pulses: Normal pulses.  Pulmonary:     Effort: Pulmonary effort is normal.    Musculoskeletal: Normal range of motion.  Skin:    General: Skin is warm and dry.  Neurological:     Mental Status: She is alert and oriented to person, place, and time.  Psychiatric:        Mood and Affect: Mood normal.        Behavior: Behavior normal.     RECENT LABS AND TESTS: BMET    Component Value Date/Time   NA 141 04/29/2018 1009   NA 141 09/24/2013 1131   K 4.7 04/29/2018 1009   K 4.5 09/24/2013 1131   CL 101 04/29/2018 1009  CO2 24 04/29/2018 1009   CO2 24 09/24/2013 1131   GLUCOSE 94 04/29/2018 1009   GLUCOSE 110 (H) 04/10/2018 1308   GLUCOSE 90 09/24/2013 1131   BUN 20 04/29/2018 1009   BUN 21.2 09/24/2013 1131   CREATININE 1.04 (H) 04/29/2018 1009   CREATININE 1.1 09/24/2013 1131   CALCIUM 9.5 04/29/2018 1009   CALCIUM 9.5 09/24/2013 1131   GFRNONAA 58 (L) 04/29/2018 1009   GFRAA 67 04/29/2018 1009   Lab Results  Component Value Date   HGBA1C 5.5 04/29/2018   Lab Results  Component Value Date   INSULIN 25.8 (H) 04/29/2018   CBC    Component Value Date/Time   WBC 5.3 04/29/2018 1009   WBC 6.1 10/04/2016 0713   RBC 4.68 04/29/2018 1009   RBC 4.55 10/04/2016 0713   HGB 14.5 04/29/2018 1009   HCT 42.5 04/29/2018 1009   PLT 268 10/04/2016 0713   MCV 91 04/29/2018 1009   MCH 31.0 04/29/2018 1009   MCH 31.6 10/04/2016 0713   MCHC 34.1 04/29/2018 1009   MCHC 35.4 10/04/2016 0713   RDW 12.3 04/29/2018 1009   LYMPHSABS 1.4 04/29/2018 1009   MONOABS 0.5 04/13/2009 1005   EOSABS 0.2 04/29/2018 1009   BASOSABS 0.0 04/29/2018 1009   Iron/TIBC/Ferritin/ %Sat No results found for: IRON, TIBC, FERRITIN, IRONPCTSAT Lipid Panel     Component Value Date/Time   CHOL 214 (H) 04/29/2018 1009   TRIG 98 04/29/2018 1009   HDL 57 04/29/2018 1009   LDLCALC 137 (H) 04/29/2018 1009   Hepatic Function Panel     Component Value Date/Time   PROT 6.6 04/29/2018 1009   PROT 6.8 09/24/2013 1131   ALBUMIN 4.2 04/29/2018 1009   ALBUMIN 3.9 09/24/2013 1131    AST 23 04/29/2018 1009   AST 23 09/24/2013 1131   ALT 26 04/29/2018 1009   ALT 27 09/24/2013 1131   ALKPHOS 91 04/29/2018 1009   ALKPHOS 81 09/24/2013 1131   BILITOT 0.3 04/29/2018 1009   BILITOT 0.53 09/24/2013 1131      Component Value Date/Time   TSH 0.564 04/29/2018 1009      OBESITY BEHAVIORAL INTERVENTION VISIT  Today's visit was # 4   Starting weight: 209 lbs Starting date: 04/29/2018 Today's weight :: 205 lbs Today's date: 06/08/2018 Total lbs lost to date: 4  ASK: We discussed the diagnosis of obesity with [name] today and Nathalie agreed to give [name] permission to discuss obesity behavioral modification therapy today.  ASSESS: Aylyn has the diagnosis of obesity and her BMI today is 33.1 Zehra is in the action stage of change   ADVISE: Morgyn was educated on the multiple health risks of obesity as well as the benefit of weight loss to improve her health. She was advised of the need for long term treatment and the importance of lifestyle modifications to improve her current health and to decrease her risk of future health problems.  AGREE: Multiple dietary modification options and treatment options were discussed and  Dianely agreed to follow the recommendations documented in the above note.  ARRANGE: Lamae was educated on the importance of frequent visits to treat obesity as outlined per CMS and USPSTF guidelines and agreed to schedule her next follow up appointment today.  I, Tammy Wysor, am acting as Location manager for [employer], FNP-C.  I have reviewed the above documentation for accuracy and completeness, and I agree with the above.  - Rose Hegner, FNP-C.

## 2018-06-10 ENCOUNTER — Encounter (INDEPENDENT_AMBULATORY_CARE_PROVIDER_SITE_OTHER): Payer: Self-pay | Admitting: Family Medicine

## 2018-06-11 ENCOUNTER — Other Ambulatory Visit (INDEPENDENT_AMBULATORY_CARE_PROVIDER_SITE_OTHER): Payer: Self-pay

## 2018-06-11 ENCOUNTER — Encounter (INDEPENDENT_AMBULATORY_CARE_PROVIDER_SITE_OTHER): Payer: Self-pay | Admitting: Family Medicine

## 2018-06-11 DIAGNOSIS — E8881 Metabolic syndrome: Secondary | ICD-10-CM

## 2018-06-11 MED ORDER — METFORMIN HCL 500 MG PO TABS: 500.0000 mg | ORAL_TABLET | Freq: Every day | ORAL | 0 refills | Status: DC

## 2018-06-15 DIAGNOSIS — G4733 Obstructive sleep apnea (adult) (pediatric): Secondary | ICD-10-CM | POA: Diagnosis not present

## 2018-06-15 MED FILL — OMEPRAZOLE 20 MG CPDR: 20 | 90 days supply | Qty: 90 | Fill #3

## 2018-06-15 MED FILL — metFORMIN HCL 500 MG TABS: 500 | 30 days supply | Qty: 30 | Fill #0

## 2018-06-16 ENCOUNTER — Encounter: Payer: Self-pay | Admitting: Gynecologic Oncology

## 2018-06-23 ENCOUNTER — Encounter (INDEPENDENT_AMBULATORY_CARE_PROVIDER_SITE_OTHER): Payer: Self-pay

## 2018-06-23 ENCOUNTER — Ambulatory Visit (INDEPENDENT_AMBULATORY_CARE_PROVIDER_SITE_OTHER): Payer: 59 | Admitting: Family Medicine

## 2018-06-24 ENCOUNTER — Ambulatory Visit (INDEPENDENT_AMBULATORY_CARE_PROVIDER_SITE_OTHER): Payer: 59 | Admitting: Family Medicine

## 2018-06-24 VITALS — BP 114/70 | HR 43 | Temp 98.3°F | Ht 66.0 in | Wt 207.0 lb

## 2018-06-24 DIAGNOSIS — E669 Obesity, unspecified: Secondary | ICD-10-CM | POA: Diagnosis not present

## 2018-06-24 DIAGNOSIS — M25552 Pain in left hip: Secondary | ICD-10-CM | POA: Diagnosis not present

## 2018-06-24 DIAGNOSIS — E8881 Metabolic syndrome: Secondary | ICD-10-CM

## 2018-06-24 DIAGNOSIS — E559 Vitamin D deficiency, unspecified: Secondary | ICD-10-CM | POA: Diagnosis not present

## 2018-06-24 DIAGNOSIS — Z6833 Body mass index (BMI) 33.0-33.9, adult: Secondary | ICD-10-CM

## 2018-06-24 DIAGNOSIS — Z9189 Other specified personal risk factors, not elsewhere classified: Secondary | ICD-10-CM | POA: Diagnosis not present

## 2018-06-24 MED ORDER — METFORMIN HCL 500 MG PO TABS: 500.0000 mg | ORAL_TABLET | Freq: Two times a day (BID) | ORAL | 0 refills | Status: DC

## 2018-06-24 MED FILL — VIT D2 1.25 MG (50,000 UNIT: 1.25 MG | 28 days supply | Qty: 4 | Fill #0

## 2018-06-25 ENCOUNTER — Encounter (INDEPENDENT_AMBULATORY_CARE_PROVIDER_SITE_OTHER): Payer: Self-pay | Admitting: Family Medicine

## 2018-06-25 NOTE — Progress Notes (Signed)
Office: [phone]  /  Fax: [phone]   HPI:   Chief Complaint: OBESITY [name] is here to discuss her progress with her obesity treatment plan. She is on the Category 2 plan + 100 calories with breakfast options and is following her eating plan approximately 80% of the time. She states she is exercising on the treadmill 20-30 minutes 2 times per week. [name] has been on 2 trips over the past 2-3 weeks and has been off of the meal plan. She reports some increased hunger with exercise. Her weight is 207 lb (93.9 kg) today and has had a weight gain of 2 lbs since her last visit. She has lost 2 lbs since starting treatment with [name].  Insulin Resistance [name] has a diagnosis of insulin resistance based on her elevated fasting insulin level >5. Although [name]'s blood glucose readings are still under good control, insulin resistance puts her at greater risk of metabolic syndrome and diabetes. She is taking metformin currently and continues to work on diet and exercise to decrease risk of diabetes. [name] reports polyphagia between lunch and dinner.  At risk for diabetes [name] is at higher than average risk for developing diabetes due to her obesity. She currently denies polyuria or polydipsia.  Vitamin D deficiency [name] has a diagnosis of Vitamin D deficiency which is not at goal. Her last Vitamin D level was reported at 29.7 on 04/29/2018. She is currently taking prescription Vit D and denies nausea, vomiting or muscle weakness.  ASSESSMENT AND PLAN:  Insulin resistance - Plan: metFORMIN (GLUCOPHAGE) 500 MG tablet  Vitamin D deficiency  At risk for diabetes mellitus  Class 1 obesity with serious comorbidity and body mass index (BMI) of 33.0 to 33.9 in adult, unspecified obesity type  PLAN:  Insulin Resistance [name] will continue to work on weight loss, exercise, and decreasing simple carbohydrates in her diet to help decrease the risk of diabetes.  [name] is taking metformin for now  and a refill prescription was written today to increase her dose to 500 mg BID with meals #60 with 0 refills. [name] agreed to follow-up with [name] as directed to monitor her progress.  Diabetes risk counseling [name] was given extended (15 minutes) diabetes prevention counseling today. She is a 63 y.o. female and has risk factors for diabetes including obesity. We discussed intensive lifestyle modifications today with an emphasis on weight loss as well as increasing exercise and decreasing simple carbohydrates in her diet.  Vitamin D Deficiency [name] was informed that low Vitamin D levels contributes to fatigue and are associated with obesity, breast, and colon cancer. She agrees to continue to take prescription Vit D and will follow-up for routine testing of Vitamin D, at least 2-3 times per year. She was informed of the risk of over-replacement of Vitamin D and agrees to not increase her dose unless she discusses this with [name] first. [name] agrees to follow-up with our clinic in 3 weeks.  Obesity [name] is currently in the action stage of change. As such, her goal is to continue with weight loss efforts. She has agreed to follow the Category 2 plan + 100 calories with breakfast options or journaling 1100-1200 calories + 75-80 grams of protein daily. [name] has been instructed to decrease walking to 20 minutes 2 times per week. We discussed the following Behavioral Modification Strategies today: better snacking choices and planning for success.  [name] has agreed to follow-up with our clinic in 3 weeks. She was informed of the importance of frequent  follow up visits to maximize her success with intensive lifestyle modifications for her multiple health conditions.  ALLERGIES: Allergies  Allergen Reactions  . Codeine Nausea And Vomiting  . Sodium Thiosalicylate     UNSPECIFIED REACTION   . Ciprofloxacin Other (See Comments)    FATIGUE  . Codone [Hydrocodone Bitartrate] Itching  . Daypro [Oxaprozin]  Rash  . Levofloxacin Other (See Comments)  . Stadol [Butorphanol Tartrate] Other (See Comments)    ALTERED MENTAL STATUS  . Sulfa Antibiotics Rash    MEDICATIONS: Current Outpatient Medications on File Prior to Visit  Medication Sig Dispense Refill  . calcium carbonate (CALCIUM 600) 600 MG TABS tablet Take by mouth.    . Diclofenac Sodium (PENNSAID) 2 % SOLN Pennsaid 20 mg/gram/actuation (2 %) topical soln in metered-dose pump  APPLY 2 PUMPS (40 MG) TO THE AFFECTED AREA BY TOPICAL ROUTE 2 TIMES PER DAY    . docusate sodium (COLACE) 100 MG capsule Take 200 mg by mouth daily as needed for mild constipation.    Marland Kitchen escitalopram (LEXAPRO) 20 MG tablet Take 10 mg by mouth daily.     Marland Kitchen leuprolide (LUPRON DEPOT, 20-MONTH,) 11.25 MG injection Inject into the muscle.    . Multiple Vitamin (MULTIVITAMIN) capsule Take 1 capsule by mouth daily.    Marland Kitchen omeprazole (PRILOSEC) 20 MG capsule Take 20 mg by mouth daily as needed (heartburn).     . Probiotic Product (ALIGN PO) Take 1 tablet by mouth daily.     . Vitamin D, Ergocalciferol, (DRISDOL) 1.25 MG (50000 UT) CAPS capsule Take 1 capsule (50,000 Units total) by mouth every 7 (seven) days. 4 capsule 0   No current facility-administered medications on file prior to visit.     PAST MEDICAL HISTORY: Past Medical History:  Diagnosis Date  . Anxiety   . Arthritis   . Cervical syndrome   . CKD (chronic kidney disease), stage III ([employer])   . Constipation   . Fatty liver   . GERD (gastroesophageal reflux disease)   . Granulosa cell tumor of ovary   . History of hiatal hernia   . Joint pain   . Obesity   . Osteoarthritis   . Ovarian cancer ([place])   . PONV (postoperative nausea and vomiting)    history of n/v,  past surgeries no n/v  . Sleep apnea     PAST SURGICAL HISTORY: Past Surgical History:  Procedure Laterality Date  . ABDOMINAL HYSTERECTOMY     TAH/BSO  . APPENDECTOMY    . EXPLORATORY LAPAROTOMY     for bowel obstruction  . Secondary  tumor debulking  2002  . SHOULDER SURGERY     2017 left  . Tumor debulking  2010  . VENTRAL HERNIA REPAIR      SOCIAL HISTORY: Social History   Tobacco Use  . Smoking status: Never Smoker  . Smokeless tobacco: Never Used  Substance Use Topics  . Alcohol use: Yes    Comment: One 8oz glass, socially  . Drug use: No    FAMILY HISTORY: Family History  Problem Relation Age of Onset  . Basal cell carcinoma Mother   . Thyroid disease Mother   . Stroke Father   . Colon cancer Father   . Hypertension Father   . Heart disease Father   . Colon cancer Paternal Uncle    ROS: Review of Systems  Constitutional: Negative for weight loss.  Gastrointestinal: Negative for nausea and vomiting.  Musculoskeletal:       Negative for  muscle weakness.  Endo/Heme/Allergies:       Positive for polyphagia. Negative for hypoglycemia.   PHYSICAL EXAM: Blood pressure 114/70, pulse (!) 43, temperature 98.3 F (36.8 C), temperature source Oral, height 5\' 6"  (1.676 m), weight 207 lb (93.9 kg), SpO2 96 %. Body mass index is 33.41 kg/m. Physical Exam Vitals signs reviewed.  Constitutional:      Appearance: Normal appearance. She is obese.  Cardiovascular:     Rate and Rhythm: Normal rate.     Pulses: Normal pulses.  Pulmonary:     Effort: Pulmonary effort is normal.     Breath sounds: Normal breath sounds.  Musculoskeletal: Normal range of motion.  Skin:    General: Skin is warm and dry.  Neurological:     Mental Status: She is alert and oriented to person, place, and time.  Psychiatric:        Behavior: Behavior normal.   RECENT LABS AND TESTS: BMET    Component Value Date/Time   NA 141 04/29/2018 1009   NA 141 09/24/2013 1131   K 4.7 04/29/2018 1009   K 4.5 09/24/2013 1131   CL 101 04/29/2018 1009   CO2 24 04/29/2018 1009   CO2 24 09/24/2013 1131   GLUCOSE 94 04/29/2018 1009   GLUCOSE 110 (H) 04/10/2018 1308   GLUCOSE 90 09/24/2013 1131   BUN 20 04/29/2018 1009   BUN 21.2  09/24/2013 1131   CREATININE 1.04 (H) 04/29/2018 1009   CREATININE 1.1 09/24/2013 1131   CALCIUM 9.5 04/29/2018 1009   CALCIUM 9.5 09/24/2013 1131   GFRNONAA 58 (L) 04/29/2018 1009   GFRAA 67 04/29/2018 1009   Lab Results  Component Value Date   HGBA1C 5.5 04/29/2018   Lab Results  Component Value Date   INSULIN 25.8 (H) 04/29/2018   CBC    Component Value Date/Time   WBC 5.3 04/29/2018 1009   WBC 6.1 10/04/2016 0713   RBC 4.68 04/29/2018 1009   RBC 4.55 10/04/2016 0713   HGB 14.5 04/29/2018 1009   HCT 42.5 04/29/2018 1009   PLT 268 10/04/2016 0713   MCV 91 04/29/2018 1009   MCH 31.0 04/29/2018 1009   MCH 31.6 10/04/2016 0713   MCHC 34.1 04/29/2018 1009   MCHC 35.4 10/04/2016 0713   RDW 12.3 04/29/2018 1009   LYMPHSABS 1.4 04/29/2018 1009   MONOABS 0.5 04/13/2009 1005   EOSABS 0.2 04/29/2018 1009   BASOSABS 0.0 04/29/2018 1009   Iron/TIBC/Ferritin/ %Sat No results found for: IRON, TIBC, FERRITIN, IRONPCTSAT Lipid Panel     Component Value Date/Time   CHOL 214 (H) 04/29/2018 1009   TRIG 98 04/29/2018 1009   HDL 57 04/29/2018 1009   LDLCALC 137 (H) 04/29/2018 1009   Hepatic Function Panel     Component Value Date/Time   PROT 6.6 04/29/2018 1009   PROT 6.8 09/24/2013 1131   ALBUMIN 4.2 04/29/2018 1009   ALBUMIN 3.9 09/24/2013 1131   AST 23 04/29/2018 1009   AST 23 09/24/2013 1131   ALT 26 04/29/2018 1009   ALT 27 09/24/2013 1131   ALKPHOS 91 04/29/2018 1009   ALKPHOS 81 09/24/2013 1131   BILITOT 0.3 04/29/2018 1009   BILITOT 0.53 09/24/2013 1131      Component Value Date/Time   TSH 0.564 04/29/2018 1009    Ref. Range 04/29/2018 10:09  Vitamin D, 25-Hydroxy Latest Ref Range: 30.0 - 100.0 ng/mL 29.7 (L)   OBESITY BEHAVIORAL INTERVENTION VISIT  Today's visit was #5  Starting weight: 209 lbs  Starting date: 04/29/2018 Today's weight: 207 lbs  Today's date: 06/24/2018 Total lbs lost to date: 2    06/24/2018  Height 5\' 6"  (1.676 m)  Weight 207 lb  (93.9 kg)  BMI (Calculated) 33.43  BLOOD PRESSURE - SYSTOLIC 916  BLOOD PRESSURE - DIASTOLIC 70   Body Fat % 38.4 %  Total Body Water (lbs) 79.4 lbs   ASK: We discussed the diagnosis of obesity with [name] today and Harris agreed to give [name] permission to discuss obesity behavioral modification therapy today.  ASSESS: Savannha has the diagnosis of obesity and her BMI today is 33.43. Teddi is in the action stage of change.   ADVISE: Hanne was educated on the multiple health risks of obesity as well as the benefit of weight loss to improve her health. She was advised of the need for long term treatment and the importance of lifestyle modifications to improve her current health and to decrease her risk of future health problems.  AGREE: Multiple dietary modification options and treatment options were discussed and  Prabhnoor agreed to follow the recommendations documented in the above note.  ARRANGE: Makenize was educated on the importance of frequent visits to treat obesity as outlined per CMS and USPSTF guidelines and agreed to schedule her next follow up appointment today.  IMichaelene Song, am acting as Location manager for [employer], FNP-C.  I have reviewed the above documentation for accuracy and completeness, and I agree with the above.  - Marilin Kofman, FNP-C.

## 2018-07-01 ENCOUNTER — Other Ambulatory Visit (INDEPENDENT_AMBULATORY_CARE_PROVIDER_SITE_OTHER): Payer: Self-pay | Admitting: Family Medicine

## 2018-07-01 DIAGNOSIS — E559 Vitamin D deficiency, unspecified: Secondary | ICD-10-CM

## 2018-07-01 MED FILL — ESCITALOPRAM 20 MG TABLET: 20 | 90 days supply | Qty: 90 | Fill #0

## 2018-07-01 MED FILL — metFORMIN HCL 500 MG TABS: 500 | 30 days supply | Qty: 60 | Fill #0

## 2018-07-02 ENCOUNTER — Telehealth: Payer: Self-pay | Admitting: *Deleted

## 2018-07-02 NOTE — Telephone Encounter (Signed)
Spoke with the patient regarding her lab/scan appt needs to be scheduled after May 18th. Patient will schedule those appts herself. Patient scheduled to see Dr. Fermin Schwab on 6/9

## 2018-07-14 ENCOUNTER — Encounter (INDEPENDENT_AMBULATORY_CARE_PROVIDER_SITE_OTHER): Payer: Self-pay

## 2018-07-14 DIAGNOSIS — G4733 Obstructive sleep apnea (adult) (pediatric): Secondary | ICD-10-CM | POA: Diagnosis not present

## 2018-07-15 ENCOUNTER — Ambulatory Visit (INDEPENDENT_AMBULATORY_CARE_PROVIDER_SITE_OTHER): Payer: 59 | Admitting: Bariatrics

## 2018-07-15 ENCOUNTER — Encounter (INDEPENDENT_AMBULATORY_CARE_PROVIDER_SITE_OTHER): Payer: Self-pay | Admitting: Bariatrics

## 2018-07-15 ENCOUNTER — Other Ambulatory Visit: Payer: Self-pay

## 2018-07-15 DIAGNOSIS — E8881 Metabolic syndrome: Secondary | ICD-10-CM

## 2018-07-15 DIAGNOSIS — E669 Obesity, unspecified: Secondary | ICD-10-CM

## 2018-07-15 DIAGNOSIS — Z6833 Body mass index (BMI) 33.0-33.9, adult: Secondary | ICD-10-CM

## 2018-07-15 DIAGNOSIS — E559 Vitamin D deficiency, unspecified: Secondary | ICD-10-CM

## 2018-07-15 MED ORDER — VITAMIN D (ERGOCALCIFEROL) 1.25 MG (50000 UNIT) PO CAPS: 50000.0000 [IU] | ORAL_CAPSULE | ORAL | 0 refills | Status: DC

## 2018-07-15 MED ORDER — METFORMIN HCL 500 MG PO TABS: 500.0000 mg | ORAL_TABLET | Freq: Two times a day (BID) | ORAL | 0 refills | Status: DC

## 2018-07-15 MED FILL — metFORMIN HCL 500 MG TABS: 500 | 30 days supply | Qty: 60 | Fill #0

## 2018-07-15 MED FILL — VIT D2 1.25 MG (50,000 UNIT: 1.25 MG | 28 days supply | Qty: 4 | Fill #0

## 2018-07-15 NOTE — Telephone Encounter (Signed)
FYI

## 2018-07-15 NOTE — Progress Notes (Addendum)
Office: [phone]  /  Fax: [phone] TeleHealth Visit:  [name] has consented to this TeleHealth visit today via telephone call. The patient is located at home, the provider is located at the [facility] office. The participants in this visit include the listed provider and patient and any and all parties involved.   HPI:   Chief Complaint: OBESITY [name] is here to discuss her progress with her obesity treatment plan. She is on the Category 2 plan and is following her eating plan approximately 80 % of the time. She states she is exercising 0 minutes 0 times per week. [name] does not think that she has lost weight, but she does not think that she has gained weight. [name] has attended some celebrations.  We were unable to weight the patient today for this TeleHealth visit.She feels as if she has maintained weight since her last visit.   Insulin Resistance [name] has a diagnosis of insulin resistance based on her elevated fasting insulin level >5. Although [name]'s blood glucose readings are still under good control, insulin resistance puts her at greater risk of metabolic syndrome and diabetes. She is taking metformin currently and continues to work on diet and exercise to decrease risk of diabetes.  Vitamin D deficiency [name] has a diagnosis of vitamin D deficiency. She is currently taking vit D and denies nausea, vomiting or muscle weakness.  ASSESSMENT AND PLAN:  Insulin resistance - Plan: metFORMIN (GLUCOPHAGE) 500 MG tablet  Vitamin D deficiency - Plan: Vitamin D, Ergocalciferol, (DRISDOL) 1.25 MG (50000 UT) CAPS capsule  Class 1 obesity with serious comorbidity and body mass index (BMI) of 33.0 to 33.9 in adult, unspecified obesity type  PLAN:  Insulin Resistance [name] will continue to work on weight loss, exercise, and decreasing simple carbohydrates in her diet to help decrease the risk of diabetes. We dicussed metformin including benefits and risks. She  was informed that eating too many simple carbohydrates or too many calories at one sitting increases the likelihood of GI side effects. [name] agreed to continue metformin 500 mg BID with meals #60 with no refills and follow up with [name] as directed to monitor her progress.  Vitamin D Deficiency [name] was informed that low vitamin D levels contributes to fatigue and are associated with obesity, breast, and colon cancer. She agrees to continue to take prescription Vit D @50 ,000 IU every week #4 with no refills  and will follow up for routine testing of vitamin D, at least 2-3 times per year. She was informed of the risk of over-replacement of vitamin D and agrees to not increase her dose unless she discusses this with [name] first.  Obesity [name] is currently in the action stage of change. As such, her goal is to continue with weight loss efforts She has agreed to follow the Category 2 plan [name] will do virtual activities at the [facility] for weight loss and overall health benefits. We discussed the following Behavioral Modification Strategies today: increase H2O intake, no skipping meals, keeping healthy foods in the home, increasing lean protein intake (will need to get 25 grams of protein), decreasing simple carbohydrates, increasing vegetables, decrease eating out and work on meal planning and easy cooking plans [name] will weight at work for the next visit.  [name] has agreed to follow up with our clinic in 2 weeks. She was informed of the importance of frequent follow up visits to maximize her success with intensive lifestyle modifications for her multiple health conditions.  ALLERGIES: Allergies  Allergen Reactions  . Codeine Nausea And Vomiting  . Sodium Thiosalicylate     UNSPECIFIED REACTION   . Ciprofloxacin Other (See Comments)    FATIGUE  . Codone [Hydrocodone Bitartrate] Itching  . Daypro [Oxaprozin] Rash  . Levofloxacin Other (See Comments)  . Stadol [Butorphanol Tartrate] Other (See  Comments)    ALTERED MENTAL STATUS  . Sulfa Antibiotics Rash    MEDICATIONS: Current Outpatient Medications on File Prior to Visit  Medication Sig Dispense Refill  . calcium carbonate (CALCIUM 600) 600 MG TABS tablet Take by mouth.    . Diclofenac Sodium (PENNSAID) 2 % SOLN Pennsaid 20 mg/gram/actuation (2 %) topical soln in metered-dose pump  APPLY 2 PUMPS (40 MG) TO THE AFFECTED AREA BY TOPICAL ROUTE 2 TIMES PER DAY    . docusate sodium (COLACE) 100 MG capsule Take 200 mg by mouth daily as needed for mild constipation.    Marland Kitchen escitalopram (LEXAPRO) 20 MG tablet Take 10 mg by mouth daily.     Marland Kitchen leuprolide (LUPRON DEPOT, 41-MONTH,) 11.25 MG injection Inject into the muscle.    . Multiple Vitamin (MULTIVITAMIN) capsule Take 1 capsule by mouth daily.    Marland Kitchen omeprazole (PRILOSEC) 20 MG capsule Take 20 mg by mouth daily as needed (heartburn).     . Probiotic Product (ALIGN PO) Take 1 tablet by mouth daily.      No current facility-administered medications on file prior to visit.     PAST MEDICAL HISTORY: Past Medical History:  Diagnosis Date  . Anxiety   . Arthritis   . Cervical syndrome   . CKD (chronic kidney disease), stage III ([place])   . Constipation   . Fatty liver   . GERD (gastroesophageal reflux disease)   . Granulosa cell tumor of ovary   . History of hiatal hernia   . Joint pain   . Obesity   . Osteoarthritis   . Ovarian cancer ([place])   . PONV (postoperative nausea and vomiting)    history of n/v,  past surgeries no n/v  . Sleep apnea     PAST SURGICAL HISTORY: Past Surgical History:  Procedure Laterality Date  . ABDOMINAL HYSTERECTOMY     TAH/BSO  . APPENDECTOMY    . EXPLORATORY LAPAROTOMY     for bowel obstruction  . Secondary tumor debulking  2002  . SHOULDER SURGERY     2017 left  . Tumor debulking  2010  . VENTRAL HERNIA REPAIR      SOCIAL HISTORY: Social History   Tobacco Use  . Smoking status: Never Smoker  . Smokeless tobacco: Never Used   Substance Use Topics  . Alcohol use: Yes    Comment: One 8oz glass, socially  . Drug use: No    FAMILY HISTORY: Family History  Problem Relation Age of Onset  . Basal cell carcinoma Mother   . Thyroid disease Mother   . Stroke Father   . Colon cancer Father   . Hypertension Father   . Heart disease Father   . Colon cancer Paternal Uncle     ROS: Review of Systems  Constitutional: Negative for weight loss.  Gastrointestinal: Negative for nausea and vomiting.  Musculoskeletal:       Negative for muscle weakness    PHYSICAL EXAM: Pt in no acute distress  RECENT LABS AND TESTS: BMET    Component Value Date/Time   NA 141 04/29/2018 1009   NA 141 09/24/2013 1131   K 4.7 04/29/2018 1009  K 4.5 09/24/2013 1131   CL 101 04/29/2018 1009   CO2 24 04/29/2018 1009   CO2 24 09/24/2013 1131   GLUCOSE 94 04/29/2018 1009   GLUCOSE 110 (H) 04/10/2018 1308   GLUCOSE 90 09/24/2013 1131   BUN 20 04/29/2018 1009   BUN 21.2 09/24/2013 1131   CREATININE 1.04 (H) 04/29/2018 1009   CREATININE 1.1 09/24/2013 1131   CALCIUM 9.5 04/29/2018 1009   CALCIUM 9.5 09/24/2013 1131   GFRNONAA 58 (L) 04/29/2018 1009   GFRAA 67 04/29/2018 1009   Lab Results  Component Value Date   HGBA1C 5.5 04/29/2018   Lab Results  Component Value Date   INSULIN 25.8 (H) 04/29/2018   CBC    Component Value Date/Time   WBC 5.3 04/29/2018 1009   WBC 6.1 10/04/2016 0713   RBC 4.68 04/29/2018 1009   RBC 4.55 10/04/2016 0713   HGB 14.5 04/29/2018 1009   HCT 42.5 04/29/2018 1009   PLT 268 10/04/2016 0713   MCV 91 04/29/2018 1009   MCH 31.0 04/29/2018 1009   MCH 31.6 10/04/2016 0713   MCHC 34.1 04/29/2018 1009   MCHC 35.4 10/04/2016 0713   RDW 12.3 04/29/2018 1009   LYMPHSABS 1.4 04/29/2018 1009   MONOABS 0.5 04/13/2009 1005   EOSABS 0.2 04/29/2018 1009   BASOSABS 0.0 04/29/2018 1009   Iron/TIBC/Ferritin/ %Sat No results found for: IRON, TIBC, FERRITIN, IRONPCTSAT Lipid Panel     Component  Value Date/Time   CHOL 214 (H) 04/29/2018 1009   TRIG 98 04/29/2018 1009   HDL 57 04/29/2018 1009   LDLCALC 137 (H) 04/29/2018 1009   Hepatic Function Panel     Component Value Date/Time   PROT 6.6 04/29/2018 1009   PROT 6.8 09/24/2013 1131   ALBUMIN 4.2 04/29/2018 1009   ALBUMIN 3.9 09/24/2013 1131   AST 23 04/29/2018 1009   AST 23 09/24/2013 1131   ALT 26 04/29/2018 1009   ALT 27 09/24/2013 1131   ALKPHOS 91 04/29/2018 1009   ALKPHOS 81 09/24/2013 1131   BILITOT 0.3 04/29/2018 1009   BILITOT 0.53 09/24/2013 1131      Component Value Date/Time   TSH 0.564 04/29/2018 1009   Results for RHONA, FUSILIER (MRN [id number]) as of 07/15/2018 15:13  Ref. Range 04/29/2018 10:09  Vitamin D, 25-Hydroxy Latest Ref Range: 30.0 - 100.0 ng/mL 29.7 (L)     I, Doreene Nest, am acting as Location manager for [employer]. Owens Shark, DO  I have reviewed the above documentation for accuracy and completeness, and I agree with the above. -Jearld Lesch, DO

## 2018-07-16 ENCOUNTER — Encounter (INDEPENDENT_AMBULATORY_CARE_PROVIDER_SITE_OTHER): Payer: Self-pay

## 2018-07-30 ENCOUNTER — Encounter (INDEPENDENT_AMBULATORY_CARE_PROVIDER_SITE_OTHER): Payer: Self-pay | Admitting: Bariatrics

## 2018-07-30 ENCOUNTER — Other Ambulatory Visit: Payer: Self-pay

## 2018-07-30 ENCOUNTER — Ambulatory Visit (INDEPENDENT_AMBULATORY_CARE_PROVIDER_SITE_OTHER): Payer: 59 | Admitting: Bariatrics

## 2018-07-30 DIAGNOSIS — Z6833 Body mass index (BMI) 33.0-33.9, adult: Secondary | ICD-10-CM

## 2018-07-30 DIAGNOSIS — E669 Obesity, unspecified: Secondary | ICD-10-CM

## 2018-07-30 DIAGNOSIS — E559 Vitamin D deficiency, unspecified: Secondary | ICD-10-CM | POA: Diagnosis not present

## 2018-07-30 DIAGNOSIS — E8881 Metabolic syndrome: Secondary | ICD-10-CM

## 2018-07-30 MED ORDER — VITAMIN D (ERGOCALCIFEROL) 1.25 MG (50000 UNIT) PO CAPS: 50000.0000 [IU] | ORAL_CAPSULE | ORAL | 0 refills | Status: DC

## 2018-08-03 NOTE — Progress Notes (Signed)
Office: [phone]  /  Fax: [phone] TeleHealth Visit:  [name] has verbally consented to this TeleHealth visit today. The patient is located at home, the provider is located at the [facility] office. The participants in this visit include the listed provider and patient and any and all parties involved. The visit was conducted today via FaceTime.  HPI:   Chief Complaint: OBESITY [name] is here to discuss her progress with her obesity treatment plan. She is on the Category 2 plan +100 calories and is following her eating plan approximately 70 % of the time. She states she is exercising 0 minutes 0 times per week. [name] thinks that she has stayed the same. She has had some stress eating, and her appetite is normal. We were unable to weigh the patient today for this TeleHealth visit. She feels as if she has maintained weight since her last visit. She has lost 2 lbs since starting treatment with [name].  Vitamin D deficiency [name] has a diagnosis of vitamin D deficiency. She is currently taking vit D and denies nausea, vomiting or muscle weakness.  Insulin Resistance [name] has a diagnosis of insulin resistance based on her elevated fasting insulin level >5. Although [name]'s blood glucose readings are still under good control, insulin resistance puts her at greater risk of metabolic syndrome and diabetes. She is taking metformin currently and continues to work on diet and exercise to decrease risk of diabetes.  ASSESSMENT AND PLAN:  Vitamin D deficiency - Plan: Vitamin D, Ergocalciferol, (DRISDOL) 1.25 MG (50000 UT) CAPS capsule  Insulin resistance  Class 1 obesity with serious comorbidity and body mass index (BMI) of 33.0 to 33.9 in adult, unspecified obesity type  PLAN:  Vitamin D Deficiency Netta was informed that low vitamin D levels contributes to fatigue and are associated with obesity, breast, and colon cancer. She agrees to continue to take prescription Vit D  @50 ,000 IU every week #4 with no refills and will follow up for routine testing of vitamin D, at least 2-3 times per year. She was informed of the risk of over-replacement of vitamin D and agrees to not increase her dose unless she discusses this with [name] first. Roshanna agrees to follow up as directed.  Insulin Resistance [name] will continue to work on weight loss, exercise, and decreasing simple carbohydrates in her diet to help decrease the risk of diabetes. We dicussed metformin including benefits and risks. She was informed that eating too many simple carbohydrates or too many calories at one sitting increases the likelihood of GI side effects. Albirtha will continue metformin for now and prescription was not written today. [name] agreed to follow up with [name] as directed to monitor her progress.  Obesity Naseem is currently in the action stage of change. As such, her goal is to continue with weight loss efforts She has agreed to follow the Category 2 plan +100 calories Dory has been instructed to work up to a goal of 150 minutes of combined cardio and strengthening exercise per week for weight loss and overall health benefits. We discussed the following Behavioral Modification Strategies today: planning for success, increase H2O intake, no skipping meals, keeping healthy foods in the home, better snacking choices, increasing lean protein intake, decreasing simple carbohydrates, increasing vegetables, decrease eating out, work on meal planning and easy cooking plans and emotional eating strategies Kissa will weigh herself at home before each visit. (no scales, will weigh at work).  [name] has agreed to follow up  with our clinic in 2 weeks. She was informed of the importance of frequent follow up visits to maximize her success with intensive lifestyle modifications for her multiple health conditions.  ALLERGIES: Allergies  Allergen Reactions  . Codeine Nausea And Vomiting  . Sodium Thiosalicylate      UNSPECIFIED REACTION   . Ciprofloxacin Other (See Comments)    FATIGUE  . Codone [Hydrocodone Bitartrate] Itching  . Daypro [Oxaprozin] Rash  . Levofloxacin Other (See Comments)  . Stadol [Butorphanol Tartrate] Other (See Comments)    ALTERED MENTAL STATUS  . Sulfa Antibiotics Rash    MEDICATIONS: Current Outpatient Medications on File Prior to Visit  Medication Sig Dispense Refill  . calcium carbonate (CALCIUM 600) 600 MG TABS tablet Take by mouth.    . Diclofenac Sodium (PENNSAID) 2 % SOLN Pennsaid 20 mg/gram/actuation (2 %) topical soln in metered-dose pump  APPLY 2 PUMPS (40 MG) TO THE AFFECTED AREA BY TOPICAL ROUTE 2 TIMES PER DAY    . docusate sodium (COLACE) 100 MG capsule Take 200 mg by mouth daily as needed for mild constipation.    Marland Kitchen escitalopram (LEXAPRO) 20 MG tablet Take 10 mg by mouth daily.     Marland Kitchen leuprolide (LUPRON DEPOT, 18-MONTH,) 11.25 MG injection Inject into the muscle.    . metFORMIN (GLUCOPHAGE) 500 MG tablet Take 1 tablet (500 mg total) by mouth 2 (two) times daily with a meal. 60 tablet 0  . Multiple Vitamin (MULTIVITAMIN) capsule Take 1 capsule by mouth daily.    Marland Kitchen omeprazole (PRILOSEC) 20 MG capsule Take 20 mg by mouth daily as needed (heartburn).     . Probiotic Product (ALIGN PO) Take 1 tablet by mouth daily.      No current facility-administered medications on file prior to visit.     PAST MEDICAL HISTORY: Past Medical History:  Diagnosis Date  . Anxiety   . Arthritis   . Cervical syndrome   . CKD (chronic kidney disease), stage III ([place])   . Constipation   . Fatty liver   . GERD (gastroesophageal reflux disease)   . Granulosa cell tumor of ovary   . History of hiatal hernia   . Joint pain   . Obesity   . Osteoarthritis   . Ovarian cancer ([place])   . PONV (postoperative nausea and vomiting)    history of n/v,  past surgeries no n/v  . Sleep apnea     PAST SURGICAL HISTORY: Past Surgical History:  Procedure Laterality Date  . ABDOMINAL  HYSTERECTOMY     TAH/BSO  . APPENDECTOMY    . EXPLORATORY LAPAROTOMY     for bowel obstruction  . Secondary tumor debulking  2002  . SHOULDER SURGERY     2017 left  . Tumor debulking  2010  . VENTRAL HERNIA REPAIR      SOCIAL HISTORY: Social History   Tobacco Use  . Smoking status: Never Smoker  . Smokeless tobacco: Never Used  Substance Use Topics  . Alcohol use: Yes    Comment: One 8oz glass, socially  . Drug use: No    FAMILY HISTORY: Family History  Problem Relation Age of Onset  . Basal cell carcinoma Mother   . Thyroid disease Mother   . Stroke Father   . Colon cancer Father   . Hypertension Father   . Heart disease Father   . Colon cancer Paternal Uncle     ROS: Review of Systems  Constitutional: Negative for weight loss.  Gastrointestinal: Negative for  nausea and vomiting.  Musculoskeletal:       Negative for muscle weakness    PHYSICAL EXAM: Pt in no acute distress  RECENT LABS AND TESTS: BMET    Component Value Date/Time   NA 141 04/29/2018 1009   NA 141 09/24/2013 1131   K 4.7 04/29/2018 1009   K 4.5 09/24/2013 1131   CL 101 04/29/2018 1009   CO2 24 04/29/2018 1009   CO2 24 09/24/2013 1131   GLUCOSE 94 04/29/2018 1009   GLUCOSE 110 (H) 04/10/2018 1308   GLUCOSE 90 09/24/2013 1131   BUN 20 04/29/2018 1009   BUN 21.2 09/24/2013 1131   CREATININE 1.04 (H) 04/29/2018 1009   CREATININE 1.1 09/24/2013 1131   CALCIUM 9.5 04/29/2018 1009   CALCIUM 9.5 09/24/2013 1131   GFRNONAA 58 (L) 04/29/2018 1009   GFRAA 67 04/29/2018 1009   Lab Results  Component Value Date   HGBA1C 5.5 04/29/2018   Lab Results  Component Value Date   INSULIN 25.8 (H) 04/29/2018   CBC    Component Value Date/Time   WBC 5.3 04/29/2018 1009   WBC 6.1 10/04/2016 0713   RBC 4.68 04/29/2018 1009   RBC 4.55 10/04/2016 0713   HGB 14.5 04/29/2018 1009   HCT 42.5 04/29/2018 1009   PLT 268 10/04/2016 0713   MCV 91 04/29/2018 1009   MCH 31.0 04/29/2018 1009   MCH  31.6 10/04/2016 0713   MCHC 34.1 04/29/2018 1009   MCHC 35.4 10/04/2016 0713   RDW 12.3 04/29/2018 1009   LYMPHSABS 1.4 04/29/2018 1009   MONOABS 0.5 04/13/2009 1005   EOSABS 0.2 04/29/2018 1009   BASOSABS 0.0 04/29/2018 1009   Iron/TIBC/Ferritin/ %Sat No results found for: IRON, TIBC, FERRITIN, IRONPCTSAT Lipid Panel     Component Value Date/Time   CHOL 214 (H) 04/29/2018 1009   TRIG 98 04/29/2018 1009   HDL 57 04/29/2018 1009   LDLCALC 137 (H) 04/29/2018 1009   Hepatic Function Panel     Component Value Date/Time   PROT 6.6 04/29/2018 1009   PROT 6.8 09/24/2013 1131   ALBUMIN 4.2 04/29/2018 1009   ALBUMIN 3.9 09/24/2013 1131   AST 23 04/29/2018 1009   AST 23 09/24/2013 1131   ALT 26 04/29/2018 1009   ALT 27 09/24/2013 1131   ALKPHOS 91 04/29/2018 1009   ALKPHOS 81 09/24/2013 1131   BILITOT 0.3 04/29/2018 1009   BILITOT 0.53 09/24/2013 1131      Component Value Date/Time   TSH 0.564 04/29/2018 1009     Ref. Range 04/29/2018 10:09  Vitamin D, 25-Hydroxy Latest Ref Range: 30.0 - 100.0 ng/mL 29.7 (L)    I, Doreene Nest, am acting as Location manager for [employer]. Owens Shark, DO  I have reviewed the above documentation for accuracy and completeness, and I agree with the above. -Jearld Lesch, DO

## 2018-08-13 ENCOUNTER — Ambulatory Visit (INDEPENDENT_AMBULATORY_CARE_PROVIDER_SITE_OTHER): Payer: Self-pay | Admitting: Bariatrics

## 2018-08-13 DIAGNOSIS — G4733 Obstructive sleep apnea (adult) (pediatric): Secondary | ICD-10-CM | POA: Diagnosis not present

## 2018-08-14 DIAGNOSIS — G4733 Obstructive sleep apnea (adult) (pediatric): Secondary | ICD-10-CM | POA: Diagnosis not present

## 2018-08-26 ENCOUNTER — Other Ambulatory Visit: Payer: Self-pay

## 2018-08-26 ENCOUNTER — Encounter (INDEPENDENT_AMBULATORY_CARE_PROVIDER_SITE_OTHER): Payer: Self-pay | Admitting: Bariatrics

## 2018-08-26 ENCOUNTER — Ambulatory Visit (INDEPENDENT_AMBULATORY_CARE_PROVIDER_SITE_OTHER): Payer: 59 | Admitting: Bariatrics

## 2018-08-26 DIAGNOSIS — E669 Obesity, unspecified: Secondary | ICD-10-CM | POA: Diagnosis not present

## 2018-08-26 DIAGNOSIS — Z6833 Body mass index (BMI) 33.0-33.9, adult: Secondary | ICD-10-CM | POA: Diagnosis not present

## 2018-08-26 DIAGNOSIS — E559 Vitamin D deficiency, unspecified: Secondary | ICD-10-CM | POA: Diagnosis not present

## 2018-08-26 DIAGNOSIS — E8881 Metabolic syndrome: Secondary | ICD-10-CM

## 2018-08-26 MED ORDER — VITAMIN D (ERGOCALCIFEROL) 1.25 MG (50000 UNIT) PO CAPS: 50000.0000 [IU] | ORAL_CAPSULE | ORAL | 0 refills | Status: DC

## 2018-08-26 MED ORDER — METFORMIN HCL 1000 MG PO TABS: 1000.0000 mg | ORAL_TABLET | Freq: Two times a day (BID) | ORAL | 0 refills | Status: DC

## 2018-08-26 MED FILL — metFORMIN HCL 1000 MG TABS: 1000 | 30 days supply | Qty: 60 | Fill #0

## 2018-08-26 MED FILL — VIT D2 1.25 MG (50,000 UNIT: 1.25 MG | 28 days supply | Qty: 4 | Fill #0

## 2018-08-26 NOTE — Telephone Encounter (Signed)
FYI

## 2018-08-27 NOTE — Progress Notes (Signed)
Office: [phone]  /  Fax: [phone] TeleHealth Visit:  [name] has verbally consented to this TeleHealth visit today. The patient is located in a parking lot (post office), the provider is located at the [employer] [facility] office. The participants in this visit include the listed provider and patient and any and all parties involved. The visit was conducted today via FaceTime.  HPI:   Chief Complaint: OBESITY [name] is here to discuss her progress with her obesity treatment plan. She is on the Category 2 plan and is following her eating plan approximately 50 % of the time. She states she walked 4 miles in the last 2 weeks.[name] has gained weight, maybe, she is unsure (weight 206 lbs). We were unable to weigh the patient today for this TeleHealth visit. She feels unsure if she has lost or gained weight since her last visit. She has lost 3 lbs since starting treatment with [name].  Insulin Resistance [name] has a diagnosis of insulin resistance based on her elevated fasting insulin level >5. Although [name]'s blood glucose readings are still under good control, insulin resistance puts her at greater risk of metabolic syndrome and diabetes. She is taking metformin currently and continues to work on diet and exercise to decrease risk of diabetes.  Vitamin D deficiency [name] has a diagnosis of vitamin D deficiency. She is currently taking vit D and denies nausea, vomiting or muscle weakness.  ASSESSMENT AND PLAN:  Vitamin D deficiency - Plan: Vitamin D, Ergocalciferol, (DRISDOL) 1.25 MG (50000 UT) CAPS capsule  Insulin resistance - Plan: metFORMIN (GLUCOPHAGE) 1000 MG tablet  Class 1 obesity with serious comorbidity and body mass index (BMI) of 33.0 to 33.9 in adult, unspecified obesity type  PLAN:  Insulin Resistance [name] will continue to work on weight loss, exercise, and decreasing simple carbohydrates in her diet to help decrease the risk of diabetes. We dicussed  metformin including benefits and risks. She was informed that eating too many simple carbohydrates or too many calories at one sitting increases the likelihood of GI side effects. Min agreed to change metformin to 1,000 mg BID #60 with no refills from 500 mg BID and follow up with [name] as directed to monitor her progress.  Vitamin D Deficiency [name] was informed that low vitamin D levels contributes to fatigue and are associated with obesity, breast, and colon cancer. She agrees to continue to take prescription Vit D @50 ,000 IU every week #4 with no refills and will follow up for routine testing of vitamin D, at least 2-3 times per year. She was informed of the risk of over-replacement of vitamin D and agrees to not increase her dose unless she discusses this with [name] first. [name] agrees to follow up as directed.  Obesity [name] is currently in the action stage of change. As such, her goal is to continue with weight loss efforts She has agreed to follow the Category 2 plan [name] will continue exercise for weight loss and overall health benefits. We discussed the following Behavioral Modification Strategies today: increase H2O intake, keeping healthy foods in the home, increasing lean protein intake, decreasing simple carbohydrates, increasing vegetables and work on meal planning and easy cooking plans [name] will weigh herself at home before each visit. Information on the [place] scale was given to patient today.  [name] has agreed to follow up with our clinic in 2 weeks. She was informed of the importance of frequent follow up visits to maximize her success with intensive lifestyle  modifications for her multiple health conditions.  ALLERGIES: Allergies  Allergen Reactions  . Codeine Nausea And Vomiting  . Sodium Thiosalicylate     UNSPECIFIED REACTION   . Ciprofloxacin Other (See Comments)    FATIGUE  . Codone [Hydrocodone Bitartrate] Itching  . Daypro [Oxaprozin] Rash  . Levofloxacin Other (See  Comments)  . Stadol [Butorphanol Tartrate] Other (See Comments)    ALTERED MENTAL STATUS  . Sulfa Antibiotics Rash    MEDICATIONS: Current Outpatient Medications on File Prior to Visit  Medication Sig Dispense Refill  . calcium carbonate (CALCIUM 600) 600 MG TABS tablet Take by mouth.    . Diclofenac Sodium (PENNSAID) 2 % SOLN Pennsaid 20 mg/gram/actuation (2 %) topical soln in metered-dose pump  APPLY 2 PUMPS (40 MG) TO THE AFFECTED AREA BY TOPICAL ROUTE 2 TIMES PER DAY    . docusate sodium (COLACE) 100 MG capsule Take 200 mg by mouth daily as needed for mild constipation.    Marland Kitchen escitalopram (LEXAPRO) 20 MG tablet Take 10 mg by mouth daily.     Marland Kitchen leuprolide (LUPRON DEPOT, 36-MONTH,) 11.25 MG injection Inject into the muscle.    . Multiple Vitamin (MULTIVITAMIN) capsule Take 1 capsule by mouth daily.    Marland Kitchen omeprazole (PRILOSEC) 20 MG capsule Take 20 mg by mouth daily as needed (heartburn).     . Probiotic Product (ALIGN PO) Take 1 tablet by mouth daily.      No current facility-administered medications on file prior to visit.     PAST MEDICAL HISTORY: Past Medical History:  Diagnosis Date  . Anxiety   . Arthritis   . Cervical syndrome   . CKD (chronic kidney disease), stage III ([place])   . Constipation   . Fatty liver   . GERD (gastroesophageal reflux disease)   . Granulosa cell tumor of ovary   . History of hiatal hernia   . Joint pain   . Obesity   . Osteoarthritis   . Ovarian cancer ([place])   . PONV (postoperative nausea and vomiting)    history of n/v,  past surgeries no n/v  . Sleep apnea     PAST SURGICAL HISTORY: Past Surgical History:  Procedure Laterality Date  . ABDOMINAL HYSTERECTOMY     TAH/BSO  . APPENDECTOMY    . EXPLORATORY LAPAROTOMY     for bowel obstruction  . Secondary tumor debulking  2002  . SHOULDER SURGERY     2017 left  . Tumor debulking  2010  . VENTRAL HERNIA REPAIR      SOCIAL HISTORY: Social History   Tobacco Use  . Smoking status:  Never Smoker  . Smokeless tobacco: Never Used  Substance Use Topics  . Alcohol use: Yes    Comment: One 8oz glass, socially  . Drug use: No    FAMILY HISTORY: Family History  Problem Relation Age of Onset  . Basal cell carcinoma Mother   . Thyroid disease Mother   . Stroke Father   . Colon cancer Father   . Hypertension Father   . Heart disease Father   . Colon cancer Paternal Uncle     ROS: Review of Systems  Gastrointestinal: Negative for nausea and vomiting.  Musculoskeletal:       Negative for muscle weakness  Endo/Heme/Allergies:       Positive for polyphagia    PHYSICAL EXAM: Pt in no acute distress  RECENT LABS AND TESTS: BMET    Component Value Date/Time   NA 141 04/29/2018 1009  NA 141 09/24/2013 1131   K 4.7 04/29/2018 1009   K 4.5 09/24/2013 1131   CL 101 04/29/2018 1009   CO2 24 04/29/2018 1009   CO2 24 09/24/2013 1131   GLUCOSE 94 04/29/2018 1009   GLUCOSE 110 (H) 04/10/2018 1308   GLUCOSE 90 09/24/2013 1131   BUN 20 04/29/2018 1009   BUN 21.2 09/24/2013 1131   CREATININE 1.04 (H) 04/29/2018 1009   CREATININE 1.1 09/24/2013 1131   CALCIUM 9.5 04/29/2018 1009   CALCIUM 9.5 09/24/2013 1131   GFRNONAA 58 (L) 04/29/2018 1009   GFRAA 67 04/29/2018 1009   Lab Results  Component Value Date   HGBA1C 5.5 04/29/2018   Lab Results  Component Value Date   INSULIN 25.8 (H) 04/29/2018   CBC    Component Value Date/Time   WBC 5.3 04/29/2018 1009   WBC 6.1 10/04/2016 0713   RBC 4.68 04/29/2018 1009   RBC 4.55 10/04/2016 0713   HGB 14.5 04/29/2018 1009   HCT 42.5 04/29/2018 1009   PLT 268 10/04/2016 0713   MCV 91 04/29/2018 1009   MCH 31.0 04/29/2018 1009   MCH 31.6 10/04/2016 0713   MCHC 34.1 04/29/2018 1009   MCHC 35.4 10/04/2016 0713   RDW 12.3 04/29/2018 1009   LYMPHSABS 1.4 04/29/2018 1009   MONOABS 0.5 04/13/2009 1005   EOSABS 0.2 04/29/2018 1009   BASOSABS 0.0 04/29/2018 1009   Iron/TIBC/Ferritin/ %Sat No results found for:  IRON, TIBC, FERRITIN, IRONPCTSAT Lipid Panel     Component Value Date/Time   CHOL 214 (H) 04/29/2018 1009   TRIG 98 04/29/2018 1009   HDL 57 04/29/2018 1009   LDLCALC 137 (H) 04/29/2018 1009   Hepatic Function Panel     Component Value Date/Time   PROT 6.6 04/29/2018 1009   PROT 6.8 09/24/2013 1131   ALBUMIN 4.2 04/29/2018 1009   ALBUMIN 3.9 09/24/2013 1131   AST 23 04/29/2018 1009   AST 23 09/24/2013 1131   ALT 26 04/29/2018 1009   ALT 27 09/24/2013 1131   ALKPHOS 91 04/29/2018 1009   ALKPHOS 81 09/24/2013 1131   BILITOT 0.3 04/29/2018 1009   BILITOT 0.53 09/24/2013 1131      Component Value Date/Time   TSH 0.564 04/29/2018 1009     Ref. Range 04/29/2018 10:09  Vitamin D, 25-Hydroxy Latest Ref Range: 30.0 - 100.0 ng/mL 29.7 (L)    I, Doreene Nest, am acting as Location manager for [employer]. Owens Shark, DO  I have reviewed the above documentation for accuracy and completeness, and I agree with the above. -Jearld Lesch, DO

## 2018-09-01 ENCOUNTER — Ambulatory Visit (HOSPITAL_COMMUNITY): Admission: RE | Admit: 2018-09-01 | Payer: 59 | Source: Ambulatory Visit

## 2018-09-01 ENCOUNTER — Ambulatory Visit (HOSPITAL_COMMUNITY): Payer: 59

## 2018-09-02 ENCOUNTER — Other Ambulatory Visit: Payer: Self-pay

## 2018-09-02 ENCOUNTER — Inpatient Hospital Stay: Payer: 59 | Attending: Gynecology

## 2018-09-02 ENCOUNTER — Ambulatory Visit (HOSPITAL_COMMUNITY)
Admission: RE | Admit: 2018-09-02 | Discharge: 2018-09-02 | Disposition: A | Discharge status: Home / Self Care | Payer: 59 | Source: Ambulatory Visit | Attending: Gynecologic Oncology | Admitting: Gynecologic Oncology

## 2018-09-02 ENCOUNTER — Ambulatory Visit (HOSPITAL_COMMUNITY): Admission: RE | Admit: 2018-09-02 | Payer: 59 | Source: Ambulatory Visit

## 2018-09-02 DIAGNOSIS — C569 Malignant neoplasm of unspecified ovary: Secondary | ICD-10-CM | POA: Diagnosis not present

## 2018-09-02 DIAGNOSIS — Z90710 Acquired absence of both cervix and uterus: Secondary | ICD-10-CM | POA: Diagnosis not present

## 2018-09-02 DIAGNOSIS — Z79818 Long term (current) use of other agents affecting estrogen receptors and estrogen levels: Secondary | ICD-10-CM | POA: Diagnosis not present

## 2018-09-02 DIAGNOSIS — R300 Dysuria: Secondary | ICD-10-CM | POA: Diagnosis not present

## 2018-09-02 DIAGNOSIS — Z90722 Acquired absence of ovaries, bilateral: Secondary | ICD-10-CM | POA: Insufficient documentation

## 2018-09-02 LAB — BUN & CREATININE (CHCC)
BUN: 27 mg/dL — ABNORMAL HIGH (ref 8–23)
Creatinine: 1.18 mg/dL — ABNORMAL HIGH (ref 0.44–1.00)
GFR, Est AFR Am: 58 mL/min — ABNORMAL LOW (ref >60)
GFR, Estimated: 50 mL/min — ABNORMAL LOW (ref >60)

## 2018-09-02 MED ORDER — GADOBUTROL 1 MMOL/ML IV SOLN
9.0000 mL | Freq: Once | INTRAVENOUS | Status: AC | PRN
  Administered 2018-09-02: 9 mL via INTRAVENOUS

## 2018-09-06 LAB — ANTI MULLERIAN HORMONE: ANTI-MULLERIAN HORMONE (AMH): 3.72 ng/mL

## 2018-09-08 LAB — INHIBIN B: Inhibin B: 70.9 pg/mL — ABNORMAL HIGH (ref 0.0–16.9)

## 2018-09-09 ENCOUNTER — Encounter (INDEPENDENT_AMBULATORY_CARE_PROVIDER_SITE_OTHER): Payer: Self-pay | Admitting: Bariatrics

## 2018-09-09 ENCOUNTER — Other Ambulatory Visit: Payer: Self-pay

## 2018-09-09 ENCOUNTER — Ambulatory Visit (INDEPENDENT_AMBULATORY_CARE_PROVIDER_SITE_OTHER): Payer: 59 | Admitting: Bariatrics

## 2018-09-09 DIAGNOSIS — Z6833 Body mass index (BMI) 33.0-33.9, adult: Secondary | ICD-10-CM

## 2018-09-09 DIAGNOSIS — E559 Vitamin D deficiency, unspecified: Secondary | ICD-10-CM

## 2018-09-09 DIAGNOSIS — E669 Obesity, unspecified: Secondary | ICD-10-CM

## 2018-09-09 DIAGNOSIS — E8881 Metabolic syndrome: Secondary | ICD-10-CM | POA: Diagnosis not present

## 2018-09-09 MED ORDER — VITAMIN D (ERGOCALCIFEROL) 1.25 MG (50000 UNIT) PO CAPS: 50000.0000 [IU] | ORAL_CAPSULE | ORAL | 0 refills | Status: DC

## 2018-09-09 NOTE — Progress Notes (Signed)
Office: [phone]  /  Fax: [phone] TeleHealth Visit:  [name] has verbally consented to this TeleHealth visit today. The patient is located at home, the provider is located at the [employer] [facility] office. The participants in this visit include the listed provider and patient and any and all parties involved. The visit was conducted today via FaceTime.  HPI:   Chief Complaint: OBESITY [name] is here to discuss her progress with her obesity treatment plan. She is on the Category 2 plan and is following her eating plan approximately 70 % of the time. She states she is exercising 0 minutes 0 times per week. [name] states that she has gained 4 pounds (weight 210 lbs). She thinks that she has some water weight gain. [name] has done some stress eating at night. We were unable to weigh the patient today for this TeleHealth visit. She feels as if she has gained weight since her last visit. She has gained 1 lb since starting treatment with [name].  Vitamin D deficiency [name] has a diagnosis of vitamin D deficiency. She is currently taking vit D and denies nausea, vomiting or muscle weakness.  Insulin Resistance [name] has a diagnosis of insulin resistance based on her elevated fasting insulin level >5. Although [name]'s blood glucose readings are still under good control, insulin resistance puts her at greater risk of metabolic syndrome and diabetes. She continues to work on diet and exercise to decrease risk of diabetes. [name] denies polyphagia.  ASSESSMENT AND PLAN:  Vitamin D deficiency - Plan: Vitamin D, Ergocalciferol, (DRISDOL) 1.25 MG (50000 UT) CAPS capsule  Insulin resistance  Class 1 obesity with serious comorbidity and body mass index (BMI) of 33.0 to 33.9 in adult, unspecified obesity type  PLAN:  Vitamin D Deficiency [name] was informed that low vitamin D levels contributes to fatigue and are associated with obesity, breast, and colon cancer. She agrees to continue to  take prescription Vit D @50 ,000 IU every week #4 with no refills and will follow up for routine testing of vitamin D, at least 2-3 times per year. She was informed of the risk of over-replacement of vitamin D and agrees to not increase her dose unless she discusses this with [name] first. [name] agrees to follow up with our clinic in 2 weeks.  Insulin Resistance [name] will continue to work on weight loss, exercise, increasing lean protein and decreasing simple carbohydrates in her diet to help decrease the risk of diabetes. She was informed that eating too many simple carbohydrates or too many calories at one sitting increases the likelihood of GI side effects. [name] agreed to follow up with [name] as directed to monitor her progress.  Obesity [name] is currently in the action stage of change. As such, her goal is to continue with weight loss efforts She has agreed to follow the Category 2 plan [name] will start to walk 2 times per week for weight loss and overall health benefits. We discussed the following Behavioral Modification Strategies today: increase H2O intake, no skipping meals, keeping healthy foods in the home, better snacking choices, increasing lean protein intake, decreasing simple carbohydrates, increasing vegetables, decrease eating out, work on meal planning and easy cooking plans and emotional eating strategies [name] will weigh herself at home before each visit.  [name] has agreed to follow up with our clinic in 2 weeks. She was informed of the importance of frequent follow up visits to maximize her success with intensive lifestyle modifications for her multiple health conditions.  ALLERGIES: Allergies  Allergen Reactions  . Codeine Nausea And Vomiting  . Sodium Thiosalicylate     UNSPECIFIED REACTION   . Ciprofloxacin Other (See Comments)    FATIGUE  . Codone [Hydrocodone Bitartrate] Itching  . Daypro [Oxaprozin] Rash  . Levofloxacin Other (See Comments)  . Stadol [Butorphanol  Tartrate] Other (See Comments)    ALTERED MENTAL STATUS  . Sulfa Antibiotics Rash    MEDICATIONS: Current Outpatient Medications on File Prior to Visit  Medication Sig Dispense Refill  . calcium carbonate (CALCIUM 600) 600 MG TABS tablet Take by mouth.    . Diclofenac Sodium (PENNSAID) 2 % SOLN Pennsaid 20 mg/gram/actuation (2 %) topical soln in metered-dose pump  APPLY 2 PUMPS (40 MG) TO THE AFFECTED AREA BY TOPICAL ROUTE 2 TIMES PER DAY    . docusate sodium (COLACE) 100 MG capsule Take 200 mg by mouth daily as needed for mild constipation.    Marland Kitchen escitalopram (LEXAPRO) 20 MG tablet Take 10 mg by mouth daily.     Marland Kitchen leuprolide (LUPRON DEPOT, 82-MONTH,) 11.25 MG injection Inject into the muscle.    . metFORMIN (GLUCOPHAGE) 1000 MG tablet Take 1 tablet (1,000 mg total) by mouth 2 (two) times daily with a meal. 60 tablet 0  . Multiple Vitamin (MULTIVITAMIN) capsule Take 1 capsule by mouth daily.    Marland Kitchen omeprazole (PRILOSEC) 20 MG capsule Take 20 mg by mouth daily as needed (heartburn).     . Probiotic Product (ALIGN PO) Take 1 tablet by mouth daily.      No current facility-administered medications on file prior to visit.     PAST MEDICAL HISTORY: Past Medical History:  Diagnosis Date  . Anxiety   . Arthritis   . Cervical syndrome   . CKD (chronic kidney disease), stage III ([place])   . Constipation   . Fatty liver   . GERD (gastroesophageal reflux disease)   . Granulosa cell tumor of ovary   . History of hiatal hernia   . Joint pain   . Obesity   . Osteoarthritis   . Ovarian cancer ([place])   . PONV (postoperative nausea and vomiting)    history of n/v,  past surgeries no n/v  . Sleep apnea     PAST SURGICAL HISTORY: Past Surgical History:  Procedure Laterality Date  . ABDOMINAL HYSTERECTOMY     TAH/BSO  . APPENDECTOMY    . EXPLORATORY LAPAROTOMY     for bowel obstruction  . Secondary tumor debulking  2002  . SHOULDER SURGERY     2017 left  . Tumor debulking  2010  . VENTRAL  HERNIA REPAIR      SOCIAL HISTORY: Social History   Tobacco Use  . Smoking status: Never Smoker  . Smokeless tobacco: Never Used  Substance Use Topics  . Alcohol use: Yes    Comment: One 8oz glass, socially  . Drug use: No    FAMILY HISTORY: Family History  Problem Relation Age of Onset  . Basal cell carcinoma Mother   . Thyroid disease Mother   . Stroke Father   . Colon cancer Father   . Hypertension Father   . Heart disease Father   . Colon cancer Paternal Uncle     ROS: Review of Systems  Constitutional: Negative for weight loss.  Gastrointestinal: Negative for nausea and vomiting.  Musculoskeletal:       Negative for muscle weakness  Endo/Heme/Allergies:       Negative for polyphagia    PHYSICAL EXAM:  Pt in no acute distress  RECENT LABS AND TESTS: BMET    Component Value Date/Time   NA 141 04/29/2018 1009   NA 141 09/24/2013 1131   K 4.7 04/29/2018 1009   K 4.5 09/24/2013 1131   CL 101 04/29/2018 1009   CO2 24 04/29/2018 1009   CO2 24 09/24/2013 1131   GLUCOSE 94 04/29/2018 1009   GLUCOSE 110 (H) 04/10/2018 1308   GLUCOSE 90 09/24/2013 1131   BUN 27 (H) 09/02/2018 0917   BUN 20 04/29/2018 1009   BUN 21.2 09/24/2013 1131   CREATININE 1.18 (H) 09/02/2018 0917   CREATININE 1.1 09/24/2013 1131   CALCIUM 9.5 04/29/2018 1009   CALCIUM 9.5 09/24/2013 1131   GFRNONAA 50 (L) 09/02/2018 0917   GFRAA 58 (L) 09/02/2018 0917   Lab Results  Component Value Date   HGBA1C 5.5 04/29/2018   Lab Results  Component Value Date   INSULIN 25.8 (H) 04/29/2018   CBC    Component Value Date/Time   WBC 5.3 04/29/2018 1009   WBC 6.1 10/04/2016 0713   RBC 4.68 04/29/2018 1009   RBC 4.55 10/04/2016 0713   HGB 14.5 04/29/2018 1009   HCT 42.5 04/29/2018 1009   PLT 268 10/04/2016 0713   MCV 91 04/29/2018 1009   MCH 31.0 04/29/2018 1009   MCH 31.6 10/04/2016 0713   MCHC 34.1 04/29/2018 1009   MCHC 35.4 10/04/2016 0713   RDW 12.3 04/29/2018 1009   LYMPHSABS  1.4 04/29/2018 1009   MONOABS 0.5 04/13/2009 1005   EOSABS 0.2 04/29/2018 1009   BASOSABS 0.0 04/29/2018 1009   Iron/TIBC/Ferritin/ %Sat No results found for: IRON, TIBC, FERRITIN, IRONPCTSAT Lipid Panel     Component Value Date/Time   CHOL 214 (H) 04/29/2018 1009   TRIG 98 04/29/2018 1009   HDL 57 04/29/2018 1009   LDLCALC 137 (H) 04/29/2018 1009   Hepatic Function Panel     Component Value Date/Time   PROT 6.6 04/29/2018 1009   PROT 6.8 09/24/2013 1131   ALBUMIN 4.2 04/29/2018 1009   ALBUMIN 3.9 09/24/2013 1131   AST 23 04/29/2018 1009   AST 23 09/24/2013 1131   ALT 26 04/29/2018 1009   ALT 27 09/24/2013 1131   ALKPHOS 91 04/29/2018 1009   ALKPHOS 81 09/24/2013 1131   BILITOT 0.3 04/29/2018 1009   BILITOT 0.53 09/24/2013 1131      Component Value Date/Time   TSH 0.564 04/29/2018 1009    Results for ARRIN, ISHLER (MRN [id number]) as of 09/09/2018 15:57  Ref. Range 04/29/2018 10:09  Vitamin D, 25-Hydroxy Latest Ref Range: 30.0 - 100.0 ng/mL 29.7 (L)    I, Doreene Nest, am acting as Location manager for [employer]. Owens Shark, DO  I have reviewed the above documentation for accuracy and completeness, and I agree with the above. -Jearld Lesch, DO

## 2018-09-13 DIAGNOSIS — G4733 Obstructive sleep apnea (adult) (pediatric): Secondary | ICD-10-CM | POA: Diagnosis not present

## 2018-09-15 MED FILL — ESCITALOPRAM 20 MG TABLET: 20 | 90 days supply | Qty: 90 | Fill #1

## 2018-09-15 MED FILL — OMEPRAZOLE 20 MG CPDR: 20 | 90 days supply | Qty: 90 | Fill #0

## 2018-09-23 ENCOUNTER — Encounter (INDEPENDENT_AMBULATORY_CARE_PROVIDER_SITE_OTHER): Payer: Self-pay | Admitting: Bariatrics

## 2018-09-23 ENCOUNTER — Other Ambulatory Visit: Payer: Self-pay

## 2018-09-23 ENCOUNTER — Ambulatory Visit (INDEPENDENT_AMBULATORY_CARE_PROVIDER_SITE_OTHER): Payer: 59 | Admitting: Bariatrics

## 2018-09-23 DIAGNOSIS — Z6833 Body mass index (BMI) 33.0-33.9, adult: Secondary | ICD-10-CM

## 2018-09-23 DIAGNOSIS — E8881 Metabolic syndrome: Secondary | ICD-10-CM | POA: Diagnosis not present

## 2018-09-23 DIAGNOSIS — E669 Obesity, unspecified: Secondary | ICD-10-CM | POA: Diagnosis not present

## 2018-09-23 DIAGNOSIS — E559 Vitamin D deficiency, unspecified: Secondary | ICD-10-CM

## 2018-09-23 MED ORDER — METFORMIN HCL 1000 MG PO TABS: 1000.0000 mg | ORAL_TABLET | Freq: Two times a day (BID) | ORAL | 0 refills | Status: DC

## 2018-09-23 MED ORDER — VITAMIN D (ERGOCALCIFEROL) 1.25 MG (50000 UNIT) PO CAPS: 50000.0000 [IU] | ORAL_CAPSULE | ORAL | 0 refills | Status: DC

## 2018-09-23 MED FILL — metFORMIN HCL 1000 MG TABS: 1000 | 30 days supply | Qty: 60 | Fill #0

## 2018-09-23 MED FILL — VIT D2 1.25 MG (50,000 UNIT: 1.25 MG | 28 days supply | Qty: 4 | Fill #0

## 2018-09-23 NOTE — Progress Notes (Signed)
Office: [phone]  /  Fax: [phone] TeleHealth Visit:  [name] has verbally consented to this TeleHealth visit today. The patient is located at work, the provider is located at the [employer] [facility] office. The participants in this visit include the listed provider and patient and any and all parties involved. The visit was conducted today via FaceTime.  HPI:   Chief Complaint: OBESITY [name] is here to discuss her progress with her obesity treatment plan. She is on the Category 2 plan and is following her eating plan approximately 90 % of the time. She states she is walking for 45 minutes 1 time per week. [name] states that she has lost weight (weight 205 lbs today). She is drinking more water. We were unable to weigh the patient today for this TeleHealth visit. She feels as if she has lost weight (down 5 lbs) since her last visit. She has lost 4 lbs since starting treatment with [name].  Vitamin D deficiency [name] has a diagnosis of vitamin D deficiency. She is currently taking vit D and denies nausea, vomiting or muscle weakness.  Insulin Resistance [name] has a diagnosis of insulin resistance based on her elevated fasting insulin level >5. Although [name]'s blood glucose readings are still under good control, insulin resistance puts her at greater risk of metabolic syndrome and diabetes. She is taking metformin currently and continues to work on diet and exercise to decrease risk of diabetes.  ASSESSMENT AND PLAN:  No diagnosis found.  PLAN:  Vitamin D Deficiency [name] was informed that low vitamin D levels contributes to fatigue and are associated with obesity, breast, and colon cancer. She agrees to continue to take prescription Vit D (Drisdol) one capsule @50 ,000 IU once weekly #4 with no refills and will follow up for routine testing of vitamin D, at least 2-3 times per year. She was informed of the risk of over-replacement of vitamin D and agrees to not increase her  dose unless she discusses this with [name] first. [name] agrees to follow up as directed.  Insulin Resistance [name] will continue to work on weight loss, exercise, and decreasing simple carbohydrates in her diet to help decrease the risk of diabetes. We dicussed metformin including benefits and risks. She was informed that eating too many simple carbohydrates or too many calories at one sitting increases the likelihood of GI side effects. [name] agrees to continue metformin 1,000 mg 2 times daily with meals #60 with no refills and follow up with [name] as directed to monitor her progress.  Obesity [name] is currently in the action stage of change. As such, her goal is to continue with weight loss efforts She has agreed to follow the Category 2 plan [name] will continue her exercise regimen for weight loss and overall health benefits. We discussed the following Behavioral Modification Strategies today: increase H2O intake, no skipping meals, keeping healthy foods in the home, increasing lean protein intake, decreasing simple carbohydrates, increasing vegetables, decrease eating out and work on meal planning and easy cooking plans [name] will weigh herself at home until she returns to the office.  [name] has agreed to follow up with our clinic in 2 weeks. She was informed of the importance of frequent follow up visits to maximize her success with intensive lifestyle modifications for her multiple health conditions.  ALLERGIES: Allergies  Allergen Reactions  . Codeine Nausea And Vomiting  . Sodium Thiosalicylate     UNSPECIFIED REACTION   . Ciprofloxacin Other (See Comments)  FATIGUE  . Codone [Hydrocodone Bitartrate] Itching  . Daypro [Oxaprozin] Rash  . Levofloxacin Other (See Comments)  . Stadol [Butorphanol Tartrate] Other (See Comments)    ALTERED MENTAL STATUS  . Sulfa Antibiotics Rash    MEDICATIONS: Current Outpatient Medications on File Prior to Visit  Medication Sig Dispense Refill  .  calcium carbonate (CALCIUM 600) 600 MG TABS tablet Take by mouth.    . docusate sodium (COLACE) 100 MG capsule Take 200 mg by mouth daily as needed for mild constipation.    Marland Kitchen escitalopram (LEXAPRO) 20 MG tablet Take 10 mg by mouth daily.     Marland Kitchen leuprolide (LUPRON DEPOT, 73-MONTH,) 11.25 MG injection Inject into the muscle.    . metFORMIN (GLUCOPHAGE) 1000 MG tablet Take 1 tablet (1,000 mg total) by mouth 2 (two) times daily with a meal. 60 tablet 0  . Multiple Vitamin (MULTIVITAMIN) capsule Take 1 capsule by mouth daily.    Marland Kitchen omeprazole (PRILOSEC) 20 MG capsule Take 20 mg by mouth daily as needed (heartburn).     . Probiotic Product (ALIGN PO) Take 1 tablet by mouth daily.     . Vitamin D, Ergocalciferol, (DRISDOL) 1.25 MG (50000 UT) CAPS capsule Take 1 capsule (50,000 Units total) by mouth every 7 (seven) days. 4 capsule 0  . Diclofenac Sodium (PENNSAID) 2 % SOLN Pennsaid 20 mg/gram/actuation (2 %) topical soln in metered-dose pump  APPLY 2 PUMPS (40 MG) TO THE AFFECTED AREA BY TOPICAL ROUTE 2 TIMES PER DAY     No current facility-administered medications on file prior to visit.     PAST MEDICAL HISTORY: Past Medical History:  Diagnosis Date  . Anxiety   . Arthritis   . Cervical syndrome   . CKD (chronic kidney disease), stage III ([place])   . Constipation   . Fatty liver   . GERD (gastroesophageal reflux disease)   . Granulosa cell tumor of ovary   . History of hiatal hernia   . Joint pain   . Obesity   . Osteoarthritis   . Ovarian cancer ([place])   . PONV (postoperative nausea and vomiting)    history of n/v,  past surgeries no n/v  . Sleep apnea     PAST SURGICAL HISTORY: Past Surgical History:  Procedure Laterality Date  . ABDOMINAL HYSTERECTOMY     TAH/BSO  . APPENDECTOMY    . EXPLORATORY LAPAROTOMY     for bowel obstruction  . Secondary tumor debulking  2002  . SHOULDER SURGERY     2017 left  . Tumor debulking  2010  . VENTRAL HERNIA REPAIR      SOCIAL HISTORY:  Social History   Tobacco Use  . Smoking status: Never Smoker  . Smokeless tobacco: Never Used  Substance Use Topics  . Alcohol use: Yes    Comment: One 8oz glass, socially  . Drug use: No    FAMILY HISTORY: Family History  Problem Relation Age of Onset  . Basal cell carcinoma Mother   . Thyroid disease Mother   . Stroke Father   . Colon cancer Father   . Hypertension Father   . Heart disease Father   . Colon cancer Paternal Uncle     ROS: Review of Systems  Constitutional: Positive for weight loss.  Gastrointestinal: Negative for nausea and vomiting.  Musculoskeletal:       Negative for muscle weakness    PHYSICAL EXAM: Pt in no acute distress  RECENT LABS AND TESTS: BMET    Component  Value Date/Time   NA 141 04/29/2018 1009   NA 141 09/24/2013 1131   K 4.7 04/29/2018 1009   K 4.5 09/24/2013 1131   CL 101 04/29/2018 1009   CO2 24 04/29/2018 1009   CO2 24 09/24/2013 1131   GLUCOSE 94 04/29/2018 1009   GLUCOSE 110 (H) 04/10/2018 1308   GLUCOSE 90 09/24/2013 1131   BUN 27 (H) 09/02/2018 0917   BUN 20 04/29/2018 1009   BUN 21.2 09/24/2013 1131   CREATININE 1.18 (H) 09/02/2018 0917   CREATININE 1.1 09/24/2013 1131   CALCIUM 9.5 04/29/2018 1009   CALCIUM 9.5 09/24/2013 1131   GFRNONAA 50 (L) 09/02/2018 0917   GFRAA 58 (L) 09/02/2018 0917   Lab Results  Component Value Date   HGBA1C 5.5 04/29/2018   Lab Results  Component Value Date   INSULIN 25.8 (H) 04/29/2018   CBC    Component Value Date/Time   WBC 5.3 04/29/2018 1009   WBC 6.1 10/04/2016 0713   RBC 4.68 04/29/2018 1009   RBC 4.55 10/04/2016 0713   HGB 14.5 04/29/2018 1009   HCT 42.5 04/29/2018 1009   PLT 268 10/04/2016 0713   MCV 91 04/29/2018 1009   MCH 31.0 04/29/2018 1009   MCH 31.6 10/04/2016 0713   MCHC 34.1 04/29/2018 1009   MCHC 35.4 10/04/2016 0713   RDW 12.3 04/29/2018 1009   LYMPHSABS 1.4 04/29/2018 1009   MONOABS 0.5 04/13/2009 1005   EOSABS 0.2 04/29/2018 1009   BASOSABS  0.0 04/29/2018 1009   Iron/TIBC/Ferritin/ %Sat No results found for: IRON, TIBC, FERRITIN, IRONPCTSAT Lipid Panel     Component Value Date/Time   CHOL 214 (H) 04/29/2018 1009   TRIG 98 04/29/2018 1009   HDL 57 04/29/2018 1009   LDLCALC 137 (H) 04/29/2018 1009   Hepatic Function Panel     Component Value Date/Time   PROT 6.6 04/29/2018 1009   PROT 6.8 09/24/2013 1131   ALBUMIN 4.2 04/29/2018 1009   ALBUMIN 3.9 09/24/2013 1131   AST 23 04/29/2018 1009   AST 23 09/24/2013 1131   ALT 26 04/29/2018 1009   ALT 27 09/24/2013 1131   ALKPHOS 91 04/29/2018 1009   ALKPHOS 81 09/24/2013 1131   BILITOT 0.3 04/29/2018 1009   BILITOT 0.53 09/24/2013 1131      Component Value Date/Time   TSH 0.564 04/29/2018 1009    Results for ISELA, STANTZ (MRN [id number]) as of 09/23/2018 12:44  Ref. Range 04/29/2018 10:09  Vitamin D, 25-Hydroxy Latest Ref Range: 30.0 - 100.0 ng/mL 29.7 (L)    I, Doreene Nest, am acting as Location manager for [employer]. Owens Shark, DO   I have reviewed the above documentation for accuracy and completeness, and I agree with the above. -Jearld Lesch, DO

## 2018-09-29 ENCOUNTER — Other Ambulatory Visit: Payer: Self-pay | Admitting: Gynecologic Oncology

## 2018-09-29 ENCOUNTER — Encounter: Payer: Self-pay | Admitting: Gynecologic Oncology

## 2018-09-29 ENCOUNTER — Encounter: Payer: Self-pay | Admitting: Gynecology

## 2018-09-29 ENCOUNTER — Inpatient Hospital Stay: Payer: 59 | Attending: Gynecology | Admitting: Gynecology

## 2018-09-29 DIAGNOSIS — C569 Malignant neoplasm of unspecified ovary: Secondary | ICD-10-CM

## 2018-09-29 DIAGNOSIS — Z79818 Long term (current) use of other agents affecting estrogen receptors and estrogen levels: Secondary | ICD-10-CM

## 2018-09-29 NOTE — Progress Notes (Signed)
Virtual Visit via Telephone Note  I connected with [name] on 09/29/18 at  8:45 AM EDT by telephone and verified that I am speaking with the correct person using two identifiers.  Location: Patient: [name] Provider: Gaspar Cola   I discussed the limitations, risks, security and privacy concerns of performing an evaluation and management service by telephone and the availability of in person appointments. I also discussed with the patient that there may be a patient responsible charge related to this service. The patient expressed understanding and agreed to proceed    I discussed the assessment and treatment plan with the patient. The patient was provided an opportunity to ask questions and all were answered. The patient agreed with the plan and demonstrated an understanding of the instructions.   The patient was advised to call back or seek an in-person evaluation if the symptoms worsen or if the condition fails to improve as anticipated.  I provided 20 minutes of non-face-to-face time during this encounter.   [name], MD    Consult Note: Gyn-Onc Patient education given on June 9 and the patient expresses understanding and acceptance of instructions. [name] 09/29/2018 9:19 AM;  [name] 63 y.o. 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 Tumor markers are stable as well. She has been on Lupron for 18 months and tolerating it well.  She is doing well and asymptomatic; working full time.  Plan:    Given the overall stability of disease and tumor markers, we have decided to continue Lupron.  She will get another injection soon.   Repeart tumor markers in 3 months.  If stable, we will defer getting an MRI.  Interval History: The patient returns today as previously scheduled for telephone followup.  She has now received 18 months of  Lupron and is tolerating it well.  An MRI was obtained in late May 2020    That revealed stable intraperitoneal disease.       From the perspective of her granulosa cell tumor, overall, the patient is entirely asymptomatic and her functional status is excellent. 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 Otherwise she specifically denies any GI, GU, or pelvic symptoms.  HPI:The patient has a long-standing history of a granulosa cell tumor of the ovary dating back to 37. She had a recurrence in 2002 and the upper abdomen which was resected and she was subsequently treated with 6 cycles of intraperitoneal cisplatin and etoposide completed in June of 2002. She was followed between 2002 in 2010 with CT scans. In November 2010 CT scan showed increased size of nodules in the pelvis. At that time she underwent exploratory laparotomy with resection of tumor nodules near the cecum and left pelvic sidewall.. The tumor was estrogen receptor negative and progesterone receptor positive. She alternated two-week courses of Megace and tamoxifen between June and December 2013. At that time CT scan showed progressive disease and the patient was switched to letrozole.  In October 2017 the patient was found to have progressive disease on MRI and her management regimen was changed to tamoxifen 20 mg daily for 2 weeks alternating with Megace 40 mg 3 times a day for 2 weeks.  Follow-up MRI in May 2018 showed progressive disease with peritoneal implants near the liver and pelvis. In October 2018, the treatment was changed to Lupron 11. 2 5 mg q 3 months.  We requested a core biopsy for assessment of ER/PR and Foundation One testing.  Unfortunately, there was not enough tissue for Foundation One testing.  ER was 50% and PR 90%. Review of the literature seeking other treatments revealed possible alternative treatments including: Lupron Bevacizamab Letrozole 2.5 mg plus Metformin 500  mg Carbo/Taxol Everolimus plus exemestane  In October 2018 we began treatment with Lupron 11.25 mg IM every 3 months.  After 3 months she had a nice initial response based on MRI findings.  After 9 months she had a mixed response on MRI and rising tumor markers.   Review of Systems:10 point review of systems is negative as noted above.   Vitals: There were no vitals taken for this visit.  P hysical Exam: Not performed:  Phone visit       Allergies  Allergen Reactions  . Codeine Nausea And Vomiting  . Sodium Thiosalicylate     UNSPECIFIED REACTION   . Ciprofloxacin Other (See Comments)    FATIGUE  . Codone [Hydrocodone Bitartrate] Itching  . Daypro [Oxaprozin] Rash  . Levofloxacin Other (See Comments)  . Stadol [Butorphanol Tartrate] Other (See Comments)    ALTERED MENTAL STATUS  . Sulfa Antibiotics Rash    Past Medical History:  Diagnosis Date  . Anxiety   . Arthritis   . Cervical syndrome   . CKD (chronic kidney disease), stage III ([place])   . Constipation   . Fatty liver   . GERD (gastroesophageal reflux disease)   . Granulosa cell tumor of ovary   . History of hiatal hernia   . Joint pain   . Obesity   . Osteoarthritis   . Ovarian cancer ([place])   . PONV (postoperative nausea and vomiting)    history of n/v,  past surgeries no n/v  . Sleep apnea     Past Surgical History:  Procedure Laterality Date  . ABDOMINAL HYSTERECTOMY     TAH/BSO  . APPENDECTOMY    . EXPLORATORY LAPAROTOMY     for bowel obstruction  . Secondary tumor debulking  2002  . SHOULDER SURGERY     2017 left  . Tumor debulking  2010  . VENTRAL HERNIA REPAIR      Current Outpatient Medications  Medication Sig Dispense Refill  . calcium carbonate (CALCIUM 600) 600 MG TABS tablet Take by mouth.    . Diclofenac Sodium (PENNSAID) 2 % SOLN Pennsaid 20 mg/gram/actuation (2 %) topical soln in metered-dose pump  APPLY 2 PUMPS (40 MG) TO THE AFFECTED AREA BY TOPICAL ROUTE 2 TIMES PER DAY    .  docusate sodium (COLACE) 100 MG capsule Take 200 mg by mouth daily as needed for mild constipation.    Marland Kitchen escitalopram (LEXAPRO) 20 MG tablet Take 10 mg by mouth daily.     Marland Kitchen leuprolide (LUPRON DEPOT, 64-MONTH,) 11.25 MG injection Inject into the muscle.    . metFORMIN (GLUCOPHAGE) 1000 MG tablet Take 1 tablet (1,000 mg total) by mouth 2 (two) times daily with a meal. 60 tablet 0  . Multiple Vitamin (MULTIVITAMIN) capsule Take 1 capsule by mouth daily.    Marland Kitchen omeprazole (PRILOSEC) 20 MG capsule Take 20 mg by mouth daily as needed (heartburn).     . Probiotic Product (ALIGN PO) Take 1 tablet by mouth daily.     . Vitamin D, Ergocalciferol, (DRISDOL) 1.25 MG (50000 UT) CAPS capsule Take 1 capsule (50,000 Units total) by mouth every 7 (seven) days. 4 capsule 0   No current facility-administered medications  for this visit.     Social History   Socioeconomic History  . Marital status: Married    Spouse name: Jahayra Mazo  . Number of children: 2  . Years of education: Not on file  . Highest education level: Not on file  Occupational History  . Occupation: Therapist, sports  Social Needs  . Financial resource strain: Not on file  . Food insecurity:    Worry: Not on file    Inability: Not on file  . Transportation needs:    Medical: Not on file    Non-medical: Not on file  Tobacco Use  . Smoking status: Never Smoker  . Smokeless tobacco: Never Used  Substance and Sexual Activity  . Alcohol use: Yes    Comment: One 8oz glass, socially  . Drug use: No  . Sexual activity: Not Currently  Lifestyle  . Physical activity:    Days per week: Not on file    Minutes per session: Not on file  . Stress: Not on file  Relationships  . Social connections:    Talks on phone: Not on file    Gets together: Not on file    Attends religious service: Not on file    Active member of club or organization: Not on file    Attends meetings of clubs or organizations: Not on file    Relationship status: Not on file  .  Intimate partner violence:    Fear of current or ex partner: Not on file    Emotionally abused: Not on file    Physically abused: Not on file    Forced sexual activity: Not on file  Other Topics Concern  . Not on file  Social History Narrative  . Not on file    Family History  Problem Relation Age of Onset  . Basal cell carcinoma Mother   . Thyroid disease Mother   . Stroke Father   . Colon cancer Father   . Hypertension Father   . Heart disease Father   . Colon cancer Paternal Uncle       [name], MD 09/29/2018, 9:00 AM

## 2018-09-29 NOTE — Patient Instructions (Signed)
Will repeat tumor markers in 3 months and have a clinic visit

## 2018-09-29 NOTE — Progress Notes (Signed)
Lupron injection per Dr. Judeth Porch

## 2018-10-02 ENCOUNTER — Telehealth: Payer: Self-pay | Admitting: *Deleted

## 2018-10-02 ENCOUNTER — Other Ambulatory Visit: Payer: Self-pay

## 2018-10-02 ENCOUNTER — Inpatient Hospital Stay: Payer: 59

## 2018-10-02 DIAGNOSIS — Z79818 Long term (current) use of other agents affecting estrogen receptors and estrogen levels: Secondary | ICD-10-CM | POA: Diagnosis not present

## 2018-10-02 DIAGNOSIS — C569 Malignant neoplasm of unspecified ovary: Secondary | ICD-10-CM | POA: Diagnosis not present

## 2018-10-02 MED ORDER — LEUPROLIDE ACETATE (3 MONTH) 11.25 MG IM KIT
11.2500 mg | PACK | Freq: Once | INTRAMUSCULAR | Status: AC
  Administered 2018-10-02: 11.25 mg via INTRAMUSCULAR
  Filled 2018-10-02: qty 11.25

## 2018-10-02 NOTE — Patient Instructions (Signed)
Leuprolide depot injection What is this medicine? LEUPROLIDE (loo PROE lide) is a man-made protein that acts like a natural hormone in the body. It decreases testosterone in men and decreases estrogen in women. In men, this medicine is used to treat advanced prostate cancer. In women, some forms of this medicine may be used to treat endometriosis, uterine fibroids, or other female hormone-related problems. This medicine may be used for other purposes; ask your health care provider or pharmacist if you have questions. COMMON BRAND NAME(S): Eligard, Lupron Depot, Lupron Depot-Ped, Viadur What should I tell my health care provider before I take this medicine? They need to know if you have any of these conditions: -diabetes -heart disease or previous heart attack -high blood pressure -high cholesterol -mental illness -osteoporosis -pain or difficulty passing urine -seizures -spinal cord metastasis -stroke -suicidal thoughts, plans, or attempt; a previous suicide attempt by you or a family member -tobacco smoker -unusual vaginal bleeding (women) -an unusual or allergic reaction to leuprolide, benzyl alcohol, other medicines, foods, dyes, or preservatives -pregnant or trying to get pregnant -breast-feeding How should I use this medicine? This medicine is for injection into a muscle or for injection under the skin. It is given by a health care professional in a hospital or clinic setting. The specific product will determine how it will be given to you. Make sure you understand which product you receive and how often you will receive it. Talk to your pediatrician regarding the use of this medicine in children. Special care may be needed. Overdosage: If you think you have taken too much of this medicine contact a poison control center or emergency room at once. NOTE: This medicine is only for you. Do not share this medicine with others. What if I miss a dose? It is important not to miss a dose.  Call your doctor or health care professional if you are unable to keep an appointment. Depot injections: Depot injections are given either once-monthly, every 12 weeks, every 16 weeks, or every 24 weeks depending on the product you are prescribed. The product you are prescribed will be based on if you are female or female, and your condition. Make sure you understand your product and dosing. What may interact with this medicine? Do not take this medicine with any of the following medications: -chasteberry This medicine may also interact with the following medications: -herbal or dietary supplements, like black cohosh or DHEA -female hormones, like estrogens or progestins and birth control pills, patches, rings, or injections -female hormones, like testosterone This list may not describe all possible interactions. Give your health care provider a list of all the medicines, herbs, non-prescription drugs, or dietary supplements you use. Also tell them if you smoke, drink alcohol, or use illegal drugs. Some items may interact with your medicine. What should I watch for while using this medicine? Visit your doctor or health care professional for regular checks on your progress. During the first weeks of treatment, your symptoms may get worse, but then will improve as you continue your treatment. You may get hot flashes, increased bone pain, increased difficulty passing urine, or an aggravation of nerve symptoms. Discuss these effects with your doctor or health care professional, some of them may improve with continued use of this medicine. Female patients may experience a menstrual cycle or spotting during the first months of therapy with this medicine. If this continues, contact your doctor or health care professional. What side effects may I notice from receiving this medicine? Side   effects that you should report to your doctor or health care professional as soon as possible: -allergic reactions like skin  rash, itching or hives, swelling of the face, lips, or tongue -breathing problems -chest pain -depression or memory disorders -pain in your legs or groin -pain at site where injected or implanted -seizures -severe headache -swelling of the feet and legs -suicidal thoughts or other mood changes -visual changes -vomiting Side effects that usually do not require medical attention (report to your doctor or health care professional if they continue or are bothersome): -breast swelling or tenderness -decrease in sex drive or performance -diarrhea -hot flashes -loss of appetite -muscle, joint, or bone pains -nausea -redness or irritation at site where injected or implanted -skin problems or acne This list may not describe all possible side effects. Call your doctor for medical advice about side effects. You may report side effects to FDA at 1-800-FDA-1088. Where should I keep my medicine? This drug is given in a hospital or clinic and will not be stored at home. NOTE: This sheet is a summary. It may not cover all possible information. If you have questions about this medicine, talk to your doctor, pharmacist, or health care provider.  2018 Elsevier/Gold Standard (2015-09-21 09:45:53)  

## 2018-10-02 NOTE — Telephone Encounter (Signed)
Send the patient a text message with her appts for September

## 2018-10-06 ENCOUNTER — Other Ambulatory Visit: Payer: Self-pay

## 2018-10-06 ENCOUNTER — Encounter (INDEPENDENT_AMBULATORY_CARE_PROVIDER_SITE_OTHER): Payer: Self-pay | Admitting: Bariatrics

## 2018-10-06 ENCOUNTER — Ambulatory Visit (INDEPENDENT_AMBULATORY_CARE_PROVIDER_SITE_OTHER): Payer: 59 | Admitting: Bariatrics

## 2018-10-06 DIAGNOSIS — E8881 Metabolic syndrome: Secondary | ICD-10-CM

## 2018-10-06 DIAGNOSIS — E559 Vitamin D deficiency, unspecified: Secondary | ICD-10-CM

## 2018-10-06 DIAGNOSIS — Z6833 Body mass index (BMI) 33.0-33.9, adult: Secondary | ICD-10-CM

## 2018-10-06 DIAGNOSIS — E669 Obesity, unspecified: Secondary | ICD-10-CM

## 2018-10-06 MED ORDER — METFORMIN HCL 1000 MG PO TABS: 1000.0000 mg | ORAL_TABLET | Freq: Two times a day (BID) | ORAL | 0 refills | Status: DC

## 2018-10-06 MED ORDER — VITAMIN D (ERGOCALCIFEROL) 1.25 MG (50000 UNIT) PO CAPS: 50000.0000 [IU] | ORAL_CAPSULE | ORAL | 0 refills | Status: DC

## 2018-10-07 NOTE — Progress Notes (Signed)
Office: [phone]  /  Fax: [phone] TeleHealth Visit:  [name] has verbally consented to this TeleHealth visit today. The patient is located at work, the provider is located at the [employer] [facility] office. The participants in this visit include the listed provider and patient and any and all parties involved. The visit was conducted today via FaceTime.  HPI:   Chief Complaint: OBESITY [name] is here to discuss her progress with her obesity treatment plan. She is on the Category 2 plan and is following her eating plan approximately 95 % of the time. She states she is walking 3 miles 4 times per week. [name] states that her weight (weight 205 lbs). She will be going to the beach. [name] is doing well with her water intake. We were unable to weigh the patient today for this TeleHealth visit. She feels as if she has maintained weight since her last visit. She has lost 4 lbs since starting treatment with [name].  Vitamin D deficiency [name] has a diagnosis of vitamin D deficiency. She is currently taking vit D and denies nausea, vomiting or muscle weakness.  Insulin Resistance [name] has a diagnosis of insulin resistance based on her elevated fasting insulin level >5. Although [name]'s blood glucose readings are still under good control, insulin resistance puts her at greater risk of metabolic syndrome and diabetes. She is taking metformin currently and continues to work on diet and exercise to decrease risk of diabetes.  ASSESSMENT AND PLAN:  Vitamin D deficiency - Plan: Vitamin D, Ergocalciferol, (DRISDOL) 1.25 MG (50000 UT) CAPS capsule  Insulin resistance - Plan: metFORMIN (GLUCOPHAGE) 1000 MG tablet  Class 1 obesity with serious comorbidity and body mass index (BMI) of 33.0 to 33.9 in adult, unspecified obesity type  PLAN:  Vitamin D Deficiency [name] was informed that low vitamin D levels contributes to fatigue and are associated with obesity, breast, and colon cancer. She  agrees to continue to take prescription Vit D @50 ,000 IU every week #4 with no refills and will follow up for routine testing of vitamin D, at least 2-3 times per year. She was informed of the risk of over-replacement of vitamin D and agrees to not increase her dose unless she discusses this with [name] first. [name] agrees to follow up as directed.  Insulin Resistance [name] will continue to work on weight loss, exercise, and decreasing simple carbohydrates in her diet to help decrease the risk of diabetes. We dicussed metformin including benefits and risks. She was informed that eating too many simple carbohydrates or too many calories at one sitting increases the likelihood of GI side effects. [name] agrees to continue metformin 1,000 mg two times daily with a meal #60 with no refills and follow up with [name] as directed to monitor her progress.  Obesity [name] is currently in the action stage of change. As such, her goal is to continue with weight loss efforts She has agreed to follow the Category 2 plan [name] will continue her exercise regimen for weight loss and overall health benefits. We discussed the following Behavioral Modification Strategies today: increase H2O intake, no skipping meals, keeping healthy foods in the home, increasing lean protein intake, decreasing simple carbohydrates, increasing vegetables, decrease eating out and work on meal planning and easy cooking plans  [name] has agreed to follow up with our clinic in 3 weeks. She was informed of the importance of frequent follow up visits to maximize her success with intensive lifestyle modifications for her multiple health  conditions.  ALLERGIES: Allergies  Allergen Reactions  . Codeine Nausea And Vomiting  . Sodium Thiosalicylate     UNSPECIFIED REACTION   . Ciprofloxacin Other (See Comments)    FATIGUE  . Codone [Hydrocodone Bitartrate] Itching  . Daypro [Oxaprozin] Rash  . Levofloxacin Other (See Comments)  . Stadol [Butorphanol  Tartrate] Other (See Comments)    ALTERED MENTAL STATUS  . Sulfa Antibiotics Rash    MEDICATIONS: Current Outpatient Medications on File Prior to Visit  Medication Sig Dispense Refill  . calcium carbonate (CALCIUM 600) 600 MG TABS tablet Take by mouth.    . Diclofenac Sodium (PENNSAID) 2 % SOLN Pennsaid 20 mg/gram/actuation (2 %) topical soln in metered-dose pump  APPLY 2 PUMPS (40 MG) TO THE AFFECTED AREA BY TOPICAL ROUTE 2 TIMES PER DAY    . docusate sodium (COLACE) 100 MG capsule Take 200 mg by mouth daily as needed for mild constipation.    Marland Kitchen escitalopram (LEXAPRO) 20 MG tablet Take 10 mg by mouth daily.     Marland Kitchen leuprolide (LUPRON DEPOT, 58-MONTH,) 11.25 MG injection Inject into the muscle.    . Multiple Vitamin (MULTIVITAMIN) capsule Take 1 capsule by mouth daily.    Marland Kitchen omeprazole (PRILOSEC) 20 MG capsule Take 20 mg by mouth daily as needed (heartburn).     . Probiotic Product (ALIGN PO) Take 1 tablet by mouth daily.      No current facility-administered medications on file prior to visit.     PAST MEDICAL HISTORY: Past Medical History:  Diagnosis Date  . Anxiety   . Arthritis   . Cervical syndrome   . CKD (chronic kidney disease), stage III ([place])   . Constipation   . Fatty liver   . GERD (gastroesophageal reflux disease)   . Granulosa cell tumor of ovary   . History of hiatal hernia   . Joint pain   . Obesity   . Osteoarthritis   . Ovarian cancer ([place])   . PONV (postoperative nausea and vomiting)    history of n/v,  past surgeries no n/v  . Sleep apnea     PAST SURGICAL HISTORY: Past Surgical History:  Procedure Laterality Date  . ABDOMINAL HYSTERECTOMY     TAH/BSO  . APPENDECTOMY    . EXPLORATORY LAPAROTOMY     for bowel obstruction  . Secondary tumor debulking  2002  . SHOULDER SURGERY     2017 left  . Tumor debulking  2010  . VENTRAL HERNIA REPAIR      SOCIAL HISTORY: Social History   Tobacco Use  . Smoking status: Never Smoker  . Smokeless tobacco:  Never Used  Substance Use Topics  . Alcohol use: Yes    Comment: One 8oz glass, socially  . Drug use: No    FAMILY HISTORY: Family History  Problem Relation Age of Onset  . Basal cell carcinoma Mother   . Thyroid disease Mother   . Stroke Father   . Colon cancer Father   . Hypertension Father   . Heart disease Father   . Colon cancer Paternal Uncle     ROS: Review of Systems  Constitutional: Negative for weight loss.  Gastrointestinal: Negative for nausea and vomiting.  Musculoskeletal:       Negative for muscle weakness    PHYSICAL EXAM: Pt in no acute distress  RECENT LABS AND TESTS: BMET    Component Value Date/Time   NA 141 04/29/2018 1009   NA 141 09/24/2013 1131   K 4.7 04/29/2018  1009   K 4.5 09/24/2013 1131   CL 101 04/29/2018 1009   CO2 24 04/29/2018 1009   CO2 24 09/24/2013 1131   GLUCOSE 94 04/29/2018 1009   GLUCOSE 110 (H) 04/10/2018 1308   GLUCOSE 90 09/24/2013 1131   BUN 27 (H) 09/02/2018 0917   BUN 20 04/29/2018 1009   BUN 21.2 09/24/2013 1131   CREATININE 1.18 (H) 09/02/2018 0917   CREATININE 1.1 09/24/2013 1131   CALCIUM 9.5 04/29/2018 1009   CALCIUM 9.5 09/24/2013 1131   GFRNONAA 50 (L) 09/02/2018 0917   GFRAA 58 (L) 09/02/2018 0917   Lab Results  Component Value Date   HGBA1C 5.5 04/29/2018   Lab Results  Component Value Date   INSULIN 25.8 (H) 04/29/2018   CBC    Component Value Date/Time   WBC 5.3 04/29/2018 1009   WBC 6.1 10/04/2016 0713   RBC 4.68 04/29/2018 1009   RBC 4.55 10/04/2016 0713   HGB 14.5 04/29/2018 1009   HCT 42.5 04/29/2018 1009   PLT 268 10/04/2016 0713   MCV 91 04/29/2018 1009   MCH 31.0 04/29/2018 1009   MCH 31.6 10/04/2016 0713   MCHC 34.1 04/29/2018 1009   MCHC 35.4 10/04/2016 0713   RDW 12.3 04/29/2018 1009   LYMPHSABS 1.4 04/29/2018 1009   MONOABS 0.5 04/13/2009 1005   EOSABS 0.2 04/29/2018 1009   BASOSABS 0.0 04/29/2018 1009   Iron/TIBC/Ferritin/ %Sat No results found for: IRON, TIBC,  FERRITIN, IRONPCTSAT Lipid Panel     Component Value Date/Time   CHOL 214 (H) 04/29/2018 1009   TRIG 98 04/29/2018 1009   HDL 57 04/29/2018 1009   LDLCALC 137 (H) 04/29/2018 1009   Hepatic Function Panel     Component Value Date/Time   PROT 6.6 04/29/2018 1009   PROT 6.8 09/24/2013 1131   ALBUMIN 4.2 04/29/2018 1009   ALBUMIN 3.9 09/24/2013 1131   AST 23 04/29/2018 1009   AST 23 09/24/2013 1131   ALT 26 04/29/2018 1009   ALT 27 09/24/2013 1131   ALKPHOS 91 04/29/2018 1009   ALKPHOS 81 09/24/2013 1131   BILITOT 0.3 04/29/2018 1009   BILITOT 0.53 09/24/2013 1131      Component Value Date/Time   TSH 0.564 04/29/2018 1009     Ref. Range 04/29/2018 10:09  Vitamin D, 25-Hydroxy Latest Ref Range: 30.0 - 100.0 ng/mL 29.7 (L)    I, Doreene Nest, am acting as Location manager for [employer]. Owens Shark, DO  I have reviewed the above documentation for accuracy and completeness, and I agree with the above. -Jearld Lesch, DO

## 2018-10-08 ENCOUNTER — Encounter (INDEPENDENT_AMBULATORY_CARE_PROVIDER_SITE_OTHER): Payer: Self-pay | Admitting: Bariatrics

## 2018-10-14 DIAGNOSIS — G4733 Obstructive sleep apnea (adult) (pediatric): Secondary | ICD-10-CM | POA: Diagnosis not present

## 2018-10-27 ENCOUNTER — Telehealth (INDEPENDENT_AMBULATORY_CARE_PROVIDER_SITE_OTHER): Payer: 59 | Admitting: Bariatrics

## 2018-10-27 ENCOUNTER — Other Ambulatory Visit: Payer: Self-pay

## 2018-10-27 ENCOUNTER — Encounter (INDEPENDENT_AMBULATORY_CARE_PROVIDER_SITE_OTHER): Payer: Self-pay | Admitting: Bariatrics

## 2018-10-27 DIAGNOSIS — Z6834 Body mass index (BMI) 34.0-34.9, adult: Secondary | ICD-10-CM | POA: Diagnosis not present

## 2018-10-27 DIAGNOSIS — E559 Vitamin D deficiency, unspecified: Secondary | ICD-10-CM

## 2018-10-27 DIAGNOSIS — E8881 Metabolic syndrome: Secondary | ICD-10-CM | POA: Diagnosis not present

## 2018-10-27 DIAGNOSIS — E669 Obesity, unspecified: Secondary | ICD-10-CM

## 2018-10-27 MED ORDER — VITAMIN D (ERGOCALCIFEROL) 1.25 MG (50000 UNIT) PO CAPS: 50000.0000 [IU] | ORAL_CAPSULE | ORAL | 0 refills | Status: DC

## 2018-10-27 MED ORDER — METFORMIN HCL 1000 MG PO TABS: 1000.0000 mg | ORAL_TABLET | Freq: Two times a day (BID) | ORAL | 0 refills | Status: DC

## 2018-10-27 MED FILL — metFORMIN HCL 1000 MG TABS: 1000 | 30 days supply | Qty: 60 | Fill #0

## 2018-10-27 MED FILL — VIT D2 1.25 MG (50,000 UNIT: 1.25 MG | 28 days supply | Qty: 4 | Fill #0

## 2018-10-27 NOTE — Progress Notes (Signed)
Office: [phone]  /  Fax: [phone] TeleHealth Visit:  [name] has verbally consented to this TeleHealth visit today. The patient is located in her car, the provider is located at the [facility] office. The participants in this visit include the listed provider and patient and any and all parties involved. The visit was conducted today via FaceTime.  HPI:   Chief Complaint: OBESITY [name] is here to discuss her progress with her obesity treatment plan. She is on the Category 2 plan and is following her eating plan approximately 90 % of the time. She states she has walked a total of 60 minutes in 3 weeks. [name] states that her weight was 206.7 yesterday. She states that she is drinking (increased fluid retention). We were unable to weigh the patient today for this TeleHealth visit. She feels as if she has gained weight since her last visit. She has lost 3 lbs since starting treatment with [name].  Vitamin D deficiency [name] has a diagnosis of vitamin D deficiency. She is currently taking vit D and denies nausea, vomiting or muscle weakness.  Insulin Resistance [name] has a diagnosis of insulin resistance based on her elevated fasting insulin level >5. Although [name]'s blood glucose readings are still under good control, insulin resistance puts her at greater risk of metabolic syndrome and diabetes. She is taking metformin currently and her appetite is normal. She continues to work on diet and exercise to decrease risk of diabetes.  ASSESSMENT AND PLAN:  Class 1 obesity with serious comorbidity and body mass index (BMI) of 34.0 to 34.9 in adult, unspecified obesity type  Insulin resistance - Plan: metFORMIN (GLUCOPHAGE) 1000 MG tablet  Vitamin D deficiency - Plan: Vitamin D, Ergocalciferol, (DRISDOL) 1.25 MG (50000 UT) CAPS capsule  PLAN:  Vitamin D Deficiency [name] was informed that low vitamin D levels contributes to fatigue and are associated with obesity,  breast, and colon cancer. She agrees to continue to take prescription Vit D @50 ,000 IU every week #4 with no refills and will follow up for routine testing of vitamin D, at least 2-3 times per year. She was informed of the risk of over-replacement of vitamin D and agrees to not increase her dose unless she discusses this with [name] first. [name] agrees to follow up with our clinic in 3 weeks.  Insulin Resistance [name] will continue to work on weight loss, exercise, and decreasing simple carbohydrates in her diet to help decrease the risk of diabetes. We dicussed metformin including benefits and risks. She was informed that eating too many simple carbohydrates or too many calories at one sitting increases the likelihood of GI side effects. [name] agreed to continue metformin 1,000 mg two times daily with a meal #60 with no refills and follow up with [name] as directed to monitor her progress.  Obesity [name] is currently in the action stage of change. As such, her goal is to continue with weight loss efforts She has agreed to follow the Category 2 plan [name] will continue to walk for weight loss and overall health benefits. We discussed the following Behavioral Modification Strategies today: increase H2O intake, no skipping meals, keeping healthy foods in the home, increasing lean protein intake, decreasing simple carbohydrates, increasing vegetables, decrease eating out and work on meal planning and intentional eating [name] will establish good food habits.  [name] has agreed to follow up with our clinic in 3 weeks. She was informed of the importance of frequent follow up visits to maximize  her success with intensive lifestyle modifications for her multiple health conditions.  ALLERGIES: Allergies  Allergen Reactions  . Codeine Nausea And Vomiting  . Sodium Thiosalicylate     UNSPECIFIED REACTION   . Ciprofloxacin Other (See Comments)    FATIGUE  . Codone [Hydrocodone Bitartrate] Itching  . Daypro  [Oxaprozin] Rash  . Levofloxacin Other (See Comments)  . Stadol [Butorphanol Tartrate] Other (See Comments)    ALTERED MENTAL STATUS  . Sulfa Antibiotics Rash    MEDICATIONS: Current Outpatient Medications on File Prior to Visit  Medication Sig Dispense Refill  . calcium carbonate (CALCIUM 600) 600 MG TABS tablet Take by mouth.    . docusate sodium (COLACE) 100 MG capsule Take 200 mg by mouth daily as needed for mild constipation.    Marland Kitchen escitalopram (LEXAPRO) 20 MG tablet Take 10 mg by mouth daily.     Marland Kitchen leuprolide (LUPRON DEPOT, 75-MONTH,) 11.25 MG injection Inject into the muscle.    . Multiple Vitamin (MULTIVITAMIN) capsule Take 1 capsule by mouth daily.    Marland Kitchen omeprazole (PRILOSEC) 20 MG capsule Take 20 mg by mouth daily as needed (heartburn).     . Probiotic Product (ALIGN PO) Take 1 tablet by mouth daily.     . Diclofenac Sodium (PENNSAID) 2 % SOLN Pennsaid 20 mg/gram/actuation (2 %) topical soln in metered-dose pump  APPLY 2 PUMPS (40 MG) TO THE AFFECTED AREA BY TOPICAL ROUTE 2 TIMES PER DAY     No current facility-administered medications on file prior to visit.     PAST MEDICAL HISTORY: Past Medical History:  Diagnosis Date  . Anxiety   . Arthritis   . Cervical syndrome   . CKD (chronic kidney disease), stage III ([place])   . Constipation   . Fatty liver   . GERD (gastroesophageal reflux disease)   . Granulosa cell tumor of ovary   . History of hiatal hernia   . Joint pain   . Obesity   . Osteoarthritis   . Ovarian cancer ([place])   . PONV (postoperative nausea and vomiting)    history of n/v,  past surgeries no n/v  . Sleep apnea     PAST SURGICAL HISTORY: Past Surgical History:  Procedure Laterality Date  . ABDOMINAL HYSTERECTOMY     TAH/BSO  . APPENDECTOMY    . EXPLORATORY LAPAROTOMY     for bowel obstruction  . Secondary tumor debulking  2002  . SHOULDER SURGERY     2017 left  . Tumor debulking  2010  . VENTRAL HERNIA REPAIR      SOCIAL HISTORY: Social  History   Tobacco Use  . Smoking status: Never Smoker  . Smokeless tobacco: Never Used  Substance Use Topics  . Alcohol use: Yes    Comment: One 8oz glass, socially  . Drug use: No    FAMILY HISTORY: Family History  Problem Relation Age of Onset  . Basal cell carcinoma Mother   . Thyroid disease Mother   . Stroke Father   . Colon cancer Father   . Hypertension Father   . Heart disease Father   . Colon cancer Paternal Uncle     ROS: Review of Systems  Constitutional: Negative for weight loss.  Gastrointestinal: Negative for nausea and vomiting.  Musculoskeletal:       Negative for muscle weakness  Endo/Heme/Allergies:       Negative for polyphagia    PHYSICAL EXAM: Pt in no acute distress  RECENT LABS AND TESTS: BMET  Component Value Date/Time   NA 141 04/29/2018 1009   NA 141 09/24/2013 1131   K 4.7 04/29/2018 1009   K 4.5 09/24/2013 1131   CL 101 04/29/2018 1009   CO2 24 04/29/2018 1009   CO2 24 09/24/2013 1131   GLUCOSE 94 04/29/2018 1009   GLUCOSE 110 (H) 04/10/2018 1308   GLUCOSE 90 09/24/2013 1131   BUN 27 (H) 09/02/2018 0917   BUN 20 04/29/2018 1009   BUN 21.2 09/24/2013 1131   CREATININE 1.18 (H) 09/02/2018 0917   CREATININE 1.1 09/24/2013 1131   CALCIUM 9.5 04/29/2018 1009   CALCIUM 9.5 09/24/2013 1131   GFRNONAA 50 (L) 09/02/2018 0917   GFRAA 58 (L) 09/02/2018 0917   Lab Results  Component Value Date   HGBA1C 5.5 04/29/2018   Lab Results  Component Value Date   INSULIN 25.8 (H) 04/29/2018   CBC    Component Value Date/Time   WBC 5.3 04/29/2018 1009   WBC 6.1 10/04/2016 0713   RBC 4.68 04/29/2018 1009   RBC 4.55 10/04/2016 0713   HGB 14.5 04/29/2018 1009   HCT 42.5 04/29/2018 1009   PLT 268 10/04/2016 0713   MCV 91 04/29/2018 1009   MCH 31.0 04/29/2018 1009   MCH 31.6 10/04/2016 0713   MCHC 34.1 04/29/2018 1009   MCHC 35.4 10/04/2016 0713   RDW 12.3 04/29/2018 1009   LYMPHSABS 1.4 04/29/2018 1009   MONOABS 0.5 04/13/2009  1005   EOSABS 0.2 04/29/2018 1009   BASOSABS 0.0 04/29/2018 1009   Iron/TIBC/Ferritin/ %Sat No results found for: IRON, TIBC, FERRITIN, IRONPCTSAT Lipid Panel     Component Value Date/Time   CHOL 214 (H) 04/29/2018 1009   TRIG 98 04/29/2018 1009   HDL 57 04/29/2018 1009   LDLCALC 137 (H) 04/29/2018 1009   Hepatic Function Panel     Component Value Date/Time   PROT 6.6 04/29/2018 1009   PROT 6.8 09/24/2013 1131   ALBUMIN 4.2 04/29/2018 1009   ALBUMIN 3.9 09/24/2013 1131   AST 23 04/29/2018 1009   AST 23 09/24/2013 1131   ALT 26 04/29/2018 1009   ALT 27 09/24/2013 1131   ALKPHOS 91 04/29/2018 1009   ALKPHOS 81 09/24/2013 1131   BILITOT 0.3 04/29/2018 1009   BILITOT 0.53 09/24/2013 1131      Component Value Date/Time   TSH 0.564 04/29/2018 1009     Ref. Range 04/29/2018 10:09  Vitamin D, 25-Hydroxy Latest Ref Range: 30.0 - 100.0 ng/mL 29.7 (L)    I, Doreene Nest, am acting as Location manager for [employer]. Owens Shark, DO  I have reviewed the above documentation for accuracy and completeness, and I agree with the above. -Jearld Lesch, DO

## 2018-11-13 DIAGNOSIS — G4733 Obstructive sleep apnea (adult) (pediatric): Secondary | ICD-10-CM | POA: Diagnosis not present

## 2018-11-17 ENCOUNTER — Telehealth: Payer: Self-pay

## 2018-11-17 NOTE — Telephone Encounter (Signed)
Told Tyrea that she is due for labs ~the first week of September. Her Lupron Inj is due~01-01-19. Meliss Cross can send the lab information to Dr. Fermin Schwab to review and determine if Lupron injection is fine or if a MRI is warranted. Kaydense will set up labs for first week of September.

## 2018-11-19 ENCOUNTER — Ambulatory Visit (INDEPENDENT_AMBULATORY_CARE_PROVIDER_SITE_OTHER): Payer: 59 | Admitting: Bariatrics

## 2018-11-19 ENCOUNTER — Other Ambulatory Visit: Payer: Self-pay

## 2018-11-19 ENCOUNTER — Encounter (INDEPENDENT_AMBULATORY_CARE_PROVIDER_SITE_OTHER): Payer: Self-pay | Admitting: Bariatrics

## 2018-11-19 VITALS — BP 116/70 | HR 54 | Temp 98.2°F | Ht 66.0 in | Wt 202.0 lb

## 2018-11-19 DIAGNOSIS — E8881 Metabolic syndrome: Secondary | ICD-10-CM

## 2018-11-19 DIAGNOSIS — E559 Vitamin D deficiency, unspecified: Secondary | ICD-10-CM

## 2018-11-19 DIAGNOSIS — E669 Obesity, unspecified: Secondary | ICD-10-CM | POA: Diagnosis not present

## 2018-11-19 DIAGNOSIS — Z6832 Body mass index (BMI) 32.0-32.9, adult: Secondary | ICD-10-CM | POA: Diagnosis not present

## 2018-11-19 DIAGNOSIS — Z9189 Other specified personal risk factors, not elsewhere classified: Secondary | ICD-10-CM | POA: Diagnosis not present

## 2018-11-19 MED ORDER — VITAMIN D (ERGOCALCIFEROL) 1.25 MG (50000 UNIT) PO CAPS: 50000.0000 [IU] | ORAL_CAPSULE | ORAL | 0 refills | Status: DC

## 2018-11-19 MED ORDER — METFORMIN HCL 1000 MG PO TABS: 1000.0000 mg | ORAL_TABLET | Freq: Two times a day (BID) | ORAL | 0 refills | Status: DC

## 2018-11-19 MED FILL — VIT D2 1.25 MG (50,000 UNIT: 1.25 MG | 28 days supply | Qty: 4 | Fill #0

## 2018-11-19 NOTE — Progress Notes (Signed)
Office: [phone]  /  Fax: [phone]   HPI:   Chief Complaint: OBESITY [name] is here to discuss her progress with her obesity treatment plan. She is on the Category 2 plan and is following her eating plan approximately 80% of the time. She states she is exercising 0 minutes 0 times per week. [name] is down 5 lbs and doing well overall. She reports eating more vegetables and doing well with her water intake.  Her weight is 202 lb (91.6 kg) today and has had a weight loss of 5 pounds over a period of 4 months since her last in-office visit. She has lost 7 lbs since starting treatment with [name].  Insulin Resistance [name] has a diagnosis of insulin resistance based on her elevated fasting insulin level >5. Although [name]'s blood glucose readings are still under good control, insulin resistance puts her at greater risk of metabolic syndrome and diabetes. She is taking metformin currently and continues to work on diet and exercise to decrease risk of diabetes. Her appetite is slightly low and she denies polyphagia.  Vitamin D deficiency Evia has a diagnosis of Vitamin D deficiency. She is currently taking prescription Vit D and denies nausea, vomiting or muscle weakness.  At risk for osteopenia and osteoporosis [name] is at higher risk of osteopenia and osteoporosis due to Vitamin D deficiency.   ASSESSMENT AND PLAN:  Insulin resistance - Plan: Comprehensive metabolic panel, Hemoglobin A1c, Insulin, random, Lipid Panel With LDL/HDL Ratio, metFORMIN (GLUCOPHAGE) 1000 MG tablet  Vitamin D deficiency - Plan: VITAMIN D 25 Hydroxy (Vit-D Deficiency, Fractures), Vitamin D, Ergocalciferol, (DRISDOL) 1.25 MG (50000 UT) CAPS capsule  At risk for osteoporosis  Class 1 obesity with serious comorbidity and body mass index (BMI) of 32.0 to 32.9 in adult, unspecified obesity type  PLAN:  Insulin Resistance [name] will continue to work on weight loss, exercise, and decreasing simple carbohydrates in  her diet to help decrease the risk of diabetes. We dicussed metformin including benefits and risks. She was informed that eating too many simple carbohydrates or too many calories at one sitting increases the likelihood of GI side effects. [name] was given a refill on her metformin 1000 mg 1 PO BID #60 with 0 refills. She agrees to follow-up with our clinic in 2 weeks.  Vitamin D Deficiency [name] was informed that low Vitamin D levels contributes to fatigue and are associated with obesity, breast, and colon cancer. She agrees to continue to take prescription Vit D @ 50,000 IU every week #4 with 0 refills and will follow-up for routine testing of Vitamin D, at least 2-3 times per year. She was informed of the risk of over-replacement of Vitamin D and agrees to not increase her dose unless she discusses this with [name] first. [name] agrees to follow-up with our clinic in 2 weeks.  At risk for osteopenia and osteoporosis [name] was given extended  (15 minutes) osteoporosis prevention counseling today. [name] is at risk for osteopenia and osteoporsis due to her Vitamin D deficiency. She was encouraged to take her Vitamin D and follow her higher calcium diet and increase strengthening exercise to help strengthen her bones and decrease her risk of osteopenia and osteoporosis.  Obesity [name] is currently in the action stage of change. As such, her goal is to continue with weight loss efforts. She has agreed to follow the Category 2 plan. [name] will work on meal planning, intentional eating, and increasing her protein intake. [name] has been instructed to increase her exercise (  swimming) for weight loss and overall health benefits. We discussed the following Behavioral Modification Strategies today: increasing lean protein intake, decreasing simple carbohydrates, increasing vegetables, increase H20 intake, decrease eating out, no skipping meals, work on meal planning and easy cooking plans, keeping healthy foods in  the home, and planning for success.  [name] has agreed to follow-up with our clinic in 2 weeks. She was informed of the importance of frequent follow-up visits to maximize her success with intensive lifestyle modifications for her multiple health conditions.  ALLERGIES: Allergies  Allergen Reactions   Codeine Nausea And Vomiting   Sodium Thiosalicylate     UNSPECIFIED REACTION    Ciprofloxacin Other (See Comments)    FATIGUE   Codone [Hydrocodone Bitartrate] Itching   Daypro [Oxaprozin] Rash   Levofloxacin Other (See Comments)   Stadol [Butorphanol Tartrate] Other (See Comments)    ALTERED MENTAL STATUS   Sulfa Antibiotics Rash    MEDICATIONS: Current Outpatient Medications on File Prior to Visit  Medication Sig Dispense Refill   calcium carbonate (CALCIUM 600) 600 MG TABS tablet Take by mouth.     Diclofenac Sodium (PENNSAID) 2 % SOLN Pennsaid 20 mg/gram/actuation (2 %) topical soln in metered-dose pump  APPLY 2 PUMPS (40 MG) TO THE AFFECTED AREA BY TOPICAL ROUTE 2 TIMES PER DAY     docusate sodium (COLACE) 100 MG capsule Take 200 mg by mouth daily as needed for mild constipation.     escitalopram (LEXAPRO) 20 MG tablet Take 10 mg by mouth daily.      leuprolide (LUPRON DEPOT, 39-MONTH,) 11.25 MG injection Inject into the muscle.     Multiple Vitamin (MULTIVITAMIN) capsule Take 1 capsule by mouth daily.     omeprazole (PRILOSEC) 20 MG capsule Take 20 mg by mouth daily as needed (heartburn).      Probiotic Product (ALIGN PO) Take 1 tablet by mouth daily.      No current facility-administered medications on file prior to visit.     PAST MEDICAL HISTORY: Past Medical History:  Diagnosis Date   Anxiety    Arthritis    Cervical syndrome    CKD (chronic kidney disease), stage III ([facility])    Constipation    Fatty liver    GERD (gastroesophageal reflux disease)    Granulosa cell tumor of ovary    History of hiatal hernia    Joint pain    Obesity     Osteoarthritis    Ovarian cancer ([place])    PONV (postoperative nausea and vomiting)    history of n/v,  past surgeries no n/v   Sleep apnea     PAST SURGICAL HISTORY: Past Surgical History:  Procedure Laterality Date   ABDOMINAL HYSTERECTOMY     TAH/BSO   APPENDECTOMY     EXPLORATORY LAPAROTOMY     for bowel obstruction   Secondary tumor debulking  2002   SHOULDER SURGERY     2017 left   Tumor debulking  2010   VENTRAL HERNIA REPAIR      SOCIAL HISTORY: Social History   Tobacco Use   Smoking status: Never Smoker   Smokeless tobacco: Never Used  Substance Use Topics   Alcohol use: Yes    Comment: One 8oz glass, socially   Drug use: No    FAMILY HISTORY: Family History  Problem Relation Age of Onset   Basal cell carcinoma Mother    Thyroid disease Mother    Stroke Father    Colon cancer Father    Hypertension  Father    Heart disease Father    Colon cancer Paternal Uncle    ROS: Review of Systems  Gastrointestinal: Negative for nausea and vomiting.  Musculoskeletal:       Negative for muscle weakness.  Endo/Heme/Allergies:       Negative for polyphagia.   PHYSICAL EXAM: Blood pressure 116/70, pulse (!) 54, temperature 98.2 F (36.8 C), temperature source Oral, height 5\' 6"  (1.676 m), weight 202 lb (91.6 kg), SpO2 99 %. Body mass index is 32.6 kg/m. Physical Exam Vitals signs reviewed.  Constitutional:      Appearance: Normal appearance. She is obese.  Cardiovascular:     Rate and Rhythm: Normal rate.     Pulses: Normal pulses.  Pulmonary:     Effort: Pulmonary effort is normal.     Breath sounds: Normal breath sounds.  Musculoskeletal: Normal range of motion.  Skin:    General: Skin is warm and dry.  Neurological:     Mental Status: She is alert and oriented to person, place, and time.  Psychiatric:        Behavior: Behavior normal.   RECENT LABS AND TESTS: BMET    Component Value Date/Time   NA 141 04/29/2018 1009   NA  141 09/24/2013 1131   K 4.7 04/29/2018 1009   K 4.5 09/24/2013 1131   CL 101 04/29/2018 1009   CO2 24 04/29/2018 1009   CO2 24 09/24/2013 1131   GLUCOSE 94 04/29/2018 1009   GLUCOSE 110 (H) 04/10/2018 1308   GLUCOSE 90 09/24/2013 1131   BUN 27 (H) 09/02/2018 0917   BUN 20 04/29/2018 1009   BUN 21.2 09/24/2013 1131   CREATININE 1.18 (H) 09/02/2018 0917   CREATININE 1.1 09/24/2013 1131   CALCIUM 9.5 04/29/2018 1009   CALCIUM 9.5 09/24/2013 1131   GFRNONAA 50 (L) 09/02/2018 0917   GFRAA 58 (L) 09/02/2018 0917   Lab Results  Component Value Date   HGBA1C 5.5 04/29/2018   Lab Results  Component Value Date   INSULIN 25.8 (H) 04/29/2018   CBC    Component Value Date/Time   WBC 5.3 04/29/2018 1009   WBC 6.1 10/04/2016 0713   RBC 4.68 04/29/2018 1009   RBC 4.55 10/04/2016 0713   HGB 14.5 04/29/2018 1009   HCT 42.5 04/29/2018 1009   PLT 268 10/04/2016 0713   MCV 91 04/29/2018 1009   MCH 31.0 04/29/2018 1009   MCH 31.6 10/04/2016 0713   MCHC 34.1 04/29/2018 1009   MCHC 35.4 10/04/2016 0713   RDW 12.3 04/29/2018 1009   LYMPHSABS 1.4 04/29/2018 1009   MONOABS 0.5 04/13/2009 1005   EOSABS 0.2 04/29/2018 1009   BASOSABS 0.0 04/29/2018 1009   Iron/TIBC/Ferritin/ %Sat No results found for: IRON, TIBC, FERRITIN, IRONPCTSAT Lipid Panel     Component Value Date/Time   CHOL 214 (H) 04/29/2018 1009   TRIG 98 04/29/2018 1009   HDL 57 04/29/2018 1009   LDLCALC 137 (H) 04/29/2018 1009   Hepatic Function Panel     Component Value Date/Time   PROT 6.6 04/29/2018 1009   PROT 6.8 09/24/2013 1131   ALBUMIN 4.2 04/29/2018 1009   ALBUMIN 3.9 09/24/2013 1131   AST 23 04/29/2018 1009   AST 23 09/24/2013 1131   ALT 26 04/29/2018 1009   ALT 27 09/24/2013 1131   ALKPHOS 91 04/29/2018 1009   ALKPHOS 81 09/24/2013 1131   BILITOT 0.3 04/29/2018 1009   BILITOT 0.53 09/24/2013 1131      Component Value  Date/Time   TSH 0.564 04/29/2018 1009   Results for TAMERIA, PATTI (MRN  [id number]) as of 11/19/2018 09:25  Ref. Range 04/29/2018 10:09  Vitamin D, 25-Hydroxy Latest Ref Range: 30.0 - 100.0 ng/mL 29.7 (L)   OBESITY BEHAVIORAL INTERVENTION VISIT  Today's visit was #13   Starting weight: 209 lbs Starting date: 04/29/2018 Today's weight: 202 lbs Today's date: 11/19/2018 Total lbs lost to date: 7    11/19/2018  Height 5\' 6"  (1.676 m)  Weight 202 lb (91.6 kg)  BMI (Calculated) 32.62  BLOOD PRESSURE - SYSTOLIC 600  BLOOD PRESSURE - DIASTOLIC 70   Body Fat % 44 %  Total Body Water (lbs) 76 lbs   ASK: We discussed the diagnosis of obesity with [name] today and Dorthey agreed to give [name] permission to discuss obesity behavioral modification therapy today.  ASSESS: Dorann has the diagnosis of obesity and her BMI today is 32.6. Jachelle is in the action stage of change.   ADVISE: Shaquasha was educated on the multiple health risks of obesity as well as the benefit of weight loss to improve her health. She was advised of the need for long term treatment and the importance of lifestyle modifications to improve her current health and to decrease her risk of future health problems.  AGREE: Multiple dietary modification options and treatment options were discussed and  Sherrian agreed to follow the recommendations documented in the above note.  ARRANGE: Rodney was educated on the importance of frequent visits to treat obesity as outlined per CMS and USPSTF guidelines and agreed to schedule her next follow up appointment today.  Migdalia Dk, am acting as Location manager for [employer], DO  I have reviewed the above documentation for accuracy and completeness, and I agree with the above. -Jearld Lesch, DO

## 2018-11-20 LAB — COMPREHENSIVE METABOLIC PANEL
ALT: 15 IU/L (ref 0–32)
AST: 19 IU/L (ref 0–40)
Albumin/Globulin Ratio: 2.2 (ref 1.2–2.2)
Albumin: 4.3 g/dL (ref 3.8–4.8)
Alkaline Phosphatase: 73 IU/L (ref 39–117)
BUN/Creatinine Ratio: 21 (ref 12–28)
BUN: 23 mg/dL (ref 8–27)
Bilirubin Total: 0.2 mg/dL (ref 0.0–1.2)
CO2: 23 mmol/L (ref 20–29)
Calcium: 9.7 mg/dL (ref 8.7–10.3)
Chloride: 100 mmol/L (ref 96–106)
Creatinine, Ser: 1.11 mg/dL — ABNORMAL HIGH (ref 0.57–1.00)
GFR calc Af Amer: 62 mL/min/{1.73_m2} (ref >59)
GFR calc non Af Amer: 54 mL/min/{1.73_m2} — ABNORMAL LOW (ref >59)
Globulin, Total: 2 g/dL (ref 1.5–4.5)
Glucose: 82 mg/dL (ref 65–99)
Potassium: 4.7 mmol/L (ref 3.5–5.2)
Sodium: 141 mmol/L (ref 134–144)
Total Protein: 6.3 g/dL (ref 6.0–8.5)

## 2018-11-20 LAB — VITAMIN D 25 HYDROXY (VIT D DEFICIENCY, FRACTURES): Vit D, 25-Hydroxy: 42.8 ng/mL (ref 30.0–100.0)

## 2018-11-20 LAB — LIPID PANEL WITH LDL/HDL RATIO
Cholesterol, Total: 224 mg/dL — ABNORMAL HIGH (ref 100–199)
HDL: 49 mg/dL (ref >39)
LDL Calculated: 135 mg/dL — ABNORMAL HIGH (ref 0–99)
LDl/HDL Ratio: 2.8 ratio (ref 0.0–3.2)
Triglycerides: 200 mg/dL — ABNORMAL HIGH (ref 0–149)
VLDL Cholesterol Cal: 40 mg/dL (ref 5–40)

## 2018-11-20 LAB — INSULIN, RANDOM: INSULIN: 17.6 u[IU]/mL (ref 2.6–24.9)

## 2018-11-20 LAB — HEMOGLOBIN A1C
Est. average glucose Bld gHb Est-mCnc: 103 mg/dL
Hgb A1c MFr Bld: 5.2 % (ref 4.8–5.6)

## 2018-11-20 MED FILL — metFORMIN HCL 1000 MG TABS: 1000 | 30 days supply | Qty: 60 | Fill #0

## 2018-12-08 MED FILL — OMEPRAZOLE 20 MG CAP: 20 | 90 days supply | Qty: 90 | Fill #1

## 2018-12-09 ENCOUNTER — Other Ambulatory Visit: Payer: Self-pay

## 2018-12-09 ENCOUNTER — Ambulatory Visit (INDEPENDENT_AMBULATORY_CARE_PROVIDER_SITE_OTHER): Payer: 59 | Admitting: Bariatrics

## 2018-12-09 ENCOUNTER — Encounter (INDEPENDENT_AMBULATORY_CARE_PROVIDER_SITE_OTHER): Payer: Self-pay | Admitting: Bariatrics

## 2018-12-09 VITALS — BP 112/75 | HR 50 | Temp 98.3°F | Ht 66.0 in | Wt 201.0 lb

## 2018-12-09 DIAGNOSIS — Z6832 Body mass index (BMI) 32.0-32.9, adult: Secondary | ICD-10-CM | POA: Diagnosis not present

## 2018-12-09 DIAGNOSIS — E8881 Metabolic syndrome: Secondary | ICD-10-CM | POA: Diagnosis not present

## 2018-12-09 DIAGNOSIS — E669 Obesity, unspecified: Secondary | ICD-10-CM | POA: Diagnosis not present

## 2018-12-09 DIAGNOSIS — Z9189 Other specified personal risk factors, not elsewhere classified: Secondary | ICD-10-CM | POA: Diagnosis not present

## 2018-12-09 DIAGNOSIS — E559 Vitamin D deficiency, unspecified: Secondary | ICD-10-CM | POA: Diagnosis not present

## 2018-12-09 MED ORDER — VITAMIN D (ERGOCALCIFEROL) 1.25 MG (50000 UNIT) PO CAPS: 50000.0000 [IU] | ORAL_CAPSULE | ORAL | 0 refills | Status: DC

## 2018-12-11 MED FILL — VIT D2 1.25 MG (50,000 UNIT: 1.25 MG | 28 days supply | Qty: 4 | Fill #0

## 2018-12-14 DIAGNOSIS — G4733 Obstructive sleep apnea (adult) (pediatric): Secondary | ICD-10-CM | POA: Diagnosis not present

## 2018-12-14 NOTE — Progress Notes (Signed)
Office: [phone]  /  Fax: [phone]   HPI:   Chief Complaint: OBESITY [name] is here to discuss her progress with her obesity treatment plan. She is on the Category 2 plan and is following her eating plan approximately 90 % of the time. She states she is walking 60 minutes 1 time per week. [name] is down 1 pound. She is doing ok with water and protein. It is difficult to find quick things to eat. Her weight is 201 lb (91.2 kg) today and has had a weight loss of 1 pound over a period of 3 weeks since her last visit. She has lost 8 lbs since starting treatment with [name].  Insulin Resistance [name] has a diagnosis of insulin resistance based on her elevated fasting insulin level >5. Although [name]'s blood glucose readings are still under good control, insulin resistance puts her at greater risk of metabolic syndrome and diabetes. Her last A1c was at 5.2 and last insulin level was at 17.6 She has a decrease in appetite to metformin. [name] continues to work on diet and exercise to decrease risk of diabetes.  At risk for diabetes [name] is at higher than average risk for developing diabetes due to her obesity and insulin resistance. She currently denies polyuria or polydipsia.  Vitamin D deficiency [name] has a diagnosis of vitamin D deficiency. Her last vitamin D level was at 42.8 She is currently taking vit D and denies nausea, vomiting or muscle weakness.  ASSESSMENT AND PLAN:  Vitamin D deficiency - Plan: Vitamin D, Ergocalciferol, (DRISDOL) 1.25 MG (50000 UT) CAPS capsule  Insulin resistance  At risk for diabetes mellitus  Class 1 obesity with serious comorbidity and body mass index (BMI) of 32.0 to 32.9 in adult, unspecified obesity type  PLAN:  Insulin Resistance [name] will continue to work on weight loss, exercise, and decreasing simple carbohydrates in her diet to help decrease the risk of diabetes. We dicussed metformin including benefits and risks. She was informed that  eating too many simple carbohydrates or too many calories at one sitting increases the likelihood of GI side effects. [name] will continue metformin for now and prescription was not written today. [name] agreed to follow up with [name] as directed to monitor her progress.  Diabetes risk counseling Emie was given extended (15 minutes) diabetes prevention counseling today. She is 63 y.o. female and has risk factors for diabetes including obesity and insulin resistance. We discussed intensive lifestyle modifications today with an emphasis on weight loss as well as increasing exercise and decreasing simple carbohydrates in her diet.  Vitamin D Deficiency [name] was informed that low vitamin D levels contributes to fatigue and are associated with obesity, breast, and colon cancer. [name] agrees to continue to take prescription Vit D @50 ,000 IU every week #4 with no refills and she will follow up for routine testing of vitamin D, at least 2-3 times per year. She was informed of the risk of over-replacement of vitamin D and agrees to not increase her dose unless she discusses this with [name] first. [name] agrees to follow up with our clinic in 2 weeks.  Obesity [name] is currently in the action stage of change. As such, her goal is to continue with weight loss efforts She has agreed to follow the Category 2 plan [name] will increase activity and floor exercises for weight loss and overall health benefits. We discussed the following Behavioral Modification Strategies today: planning for success, increase H2O intake, no skipping meals, keeping healthy foods in  the home, increasing lean protein intake, decreasing simple carbohydrates, increasing vegetables, decrease eating out and work on meal planning and easy cooking plans  [name] has agreed to follow up with our clinic in 2 weeks. She was informed of the importance of frequent follow up visits to maximize her success with intensive lifestyle modifications for her multiple  health conditions.  ALLERGIES: Allergies  Allergen Reactions   Codeine Nausea And Vomiting   Sodium Thiosalicylate     UNSPECIFIED REACTION    Ciprofloxacin Other (See Comments)    FATIGUE   Codone [Hydrocodone Bitartrate] Itching   Daypro [Oxaprozin] Rash   Levofloxacin Other (See Comments)   Stadol [Butorphanol Tartrate] Other (See Comments)    ALTERED MENTAL STATUS   Sulfa Antibiotics Rash    MEDICATIONS: Current Outpatient Medications on File Prior to Visit  Medication Sig Dispense Refill   calcium carbonate (CALCIUM 600) 600 MG TABS tablet Take by mouth.     Diclofenac Sodium (PENNSAID) 2 % SOLN Pennsaid 20 mg/gram/actuation (2 %) topical soln in metered-dose pump  APPLY 2 PUMPS (40 MG) TO THE AFFECTED AREA BY TOPICAL ROUTE 2 TIMES PER DAY     docusate sodium (COLACE) 100 MG capsule Take 200 mg by mouth daily as needed for mild constipation.     escitalopram (LEXAPRO) 20 MG tablet Take 10 mg by mouth daily.      leuprolide (LUPRON DEPOT, 44-MONTH,) 11.25 MG injection Inject into the muscle.     metFORMIN (GLUCOPHAGE) 1000 MG tablet Take 1 tablet (1,000 mg total) by mouth 2 (two) times daily with a meal. 60 tablet 0   Multiple Vitamin (MULTIVITAMIN) capsule Take 1 capsule by mouth daily.     omeprazole (PRILOSEC) 20 MG capsule Take 20 mg by mouth daily as needed (heartburn).      Probiotic Product (ALIGN PO) Take 1 tablet by mouth daily.      No current facility-administered medications on file prior to visit.     PAST MEDICAL HISTORY: Past Medical History:  Diagnosis Date   Anxiety    Arthritis    Cervical syndrome    CKD (chronic kidney disease), stage III (HCC)    Constipation    Fatty liver    GERD (gastroesophageal reflux disease)    Granulosa cell tumor of ovary    History of hiatal hernia    Joint pain    Obesity    Osteoarthritis    Ovarian cancer ([place])    PONV (postoperative nausea and vomiting)    history of n/v,  past  surgeries no n/v   Sleep apnea     PAST SURGICAL HISTORY: Past Surgical History:  Procedure Laterality Date   ABDOMINAL HYSTERECTOMY     TAH/BSO   APPENDECTOMY     EXPLORATORY LAPAROTOMY     for bowel obstruction   Secondary tumor debulking  2002   SHOULDER SURGERY     2017 left   Tumor debulking  2010   VENTRAL HERNIA REPAIR      SOCIAL HISTORY: Social History   Tobacco Use   Smoking status: Never Smoker   Smokeless tobacco: Never Used  Substance Use Topics   Alcohol use: Yes    Comment: One 8oz glass, socially   Drug use: No    FAMILY HISTORY: Family History  Problem Relation Age of Onset   Basal cell carcinoma Mother    Thyroid disease Mother    Stroke Father    Colon cancer Father    Hypertension Father  Heart disease Father    Colon cancer Paternal Uncle     ROS: Review of Systems  Constitutional: Positive for weight loss.  Gastrointestinal: Negative for nausea and vomiting.  Genitourinary: Negative for frequency.  Musculoskeletal:       Negative for muscle weakness  Endo/Heme/Allergies: Negative for polydipsia.       Negative for polyphagia    PHYSICAL EXAM: Pulse (!) 50, temperature 98.3 F (36.8 C), temperature source Oral, height 5\' 6"  (1.676 m), weight 201 lb (91.2 kg), SpO2 97 %. Body mass index is 32.44 kg/m. Physical Exam Vitals signs reviewed.  Constitutional:      Appearance: Normal appearance. She is well-developed. She is obese.  Cardiovascular:     Rate and Rhythm: Normal rate.  Pulmonary:     Effort: Pulmonary effort is normal.  Musculoskeletal: Normal range of motion.  Skin:    General: Skin is warm and dry.  Neurological:     Mental Status: She is alert and oriented to person, place, and time.  Psychiatric:        Mood and Affect: Mood normal.        Behavior: Behavior normal.     RECENT LABS AND TESTS: BMET    Component Value Date/Time   NA 141 11/19/2018 0945   NA 141 09/24/2013 1131   K  4.7 11/19/2018 0945   K 4.5 09/24/2013 1131   CL 100 11/19/2018 0945   CO2 23 11/19/2018 0945   CO2 24 09/24/2013 1131   GLUCOSE 82 11/19/2018 0945   GLUCOSE 110 (H) 04/10/2018 1308   GLUCOSE 90 09/24/2013 1131   BUN 23 11/19/2018 0945   BUN 21.2 09/24/2013 1131   CREATININE 1.11 (H) 11/19/2018 0945   CREATININE 1.18 (H) 09/02/2018 0917   CREATININE 1.1 09/24/2013 1131   CALCIUM 9.7 11/19/2018 0945   CALCIUM 9.5 09/24/2013 1131   GFRNONAA 54 (L) 11/19/2018 0945   GFRNONAA 50 (L) 09/02/2018 0917   GFRAA 62 11/19/2018 0945   GFRAA 58 (L) 09/02/2018 0917   Lab Results  Component Value Date   HGBA1C 5.2 11/19/2018   HGBA1C 5.5 04/29/2018   Lab Results  Component Value Date   INSULIN 17.6 11/19/2018   INSULIN 25.8 (H) 04/29/2018   CBC    Component Value Date/Time   WBC 5.3 04/29/2018 1009   WBC 6.1 10/04/2016 0713   RBC 4.68 04/29/2018 1009   RBC 4.55 10/04/2016 0713   HGB 14.5 04/29/2018 1009   HCT 42.5 04/29/2018 1009   PLT 268 10/04/2016 0713   MCV 91 04/29/2018 1009   MCH 31.0 04/29/2018 1009   MCH 31.6 10/04/2016 0713   MCHC 34.1 04/29/2018 1009   MCHC 35.4 10/04/2016 0713   RDW 12.3 04/29/2018 1009   LYMPHSABS 1.4 04/29/2018 1009   MONOABS 0.5 04/13/2009 1005   EOSABS 0.2 04/29/2018 1009   BASOSABS 0.0 04/29/2018 1009   Iron/TIBC/Ferritin/ %Sat No results found for: IRON, TIBC, FERRITIN, IRONPCTSAT Lipid Panel     Component Value Date/Time   CHOL 224 (H) 11/19/2018 0945   TRIG 200 (H) 11/19/2018 0945   HDL 49 11/19/2018 0945   LDLCALC 135 (H) 11/19/2018 0945   Hepatic Function Panel     Component Value Date/Time   PROT 6.3 11/19/2018 0945   PROT 6.8 09/24/2013 1131   ALBUMIN 4.3 11/19/2018 0945   ALBUMIN 3.9 09/24/2013 1131   AST 19 11/19/2018 0945   AST 23 09/24/2013 1131   ALT 15 11/19/2018 0945   ALT  27 09/24/2013 1131   ALKPHOS 73 11/19/2018 0945   ALKPHOS 81 09/24/2013 1131   BILITOT 0.2 11/19/2018 0945   BILITOT 0.53 09/24/2013 1131       Component Value Date/Time   TSH 0.564 04/29/2018 1009     Ref. Range 11/19/2018 09:45  Vitamin D, 25-Hydroxy Latest Ref Range: 30.0 - 100.0 ng/mL 42.8    OBESITY BEHAVIORAL INTERVENTION VISIT  Today's visit was # 14   Starting weight: 209 lbs Starting date: 04/29/2018 Today's weight : 201 lbs Today's date: 12/09/2018 Total lbs lost to date: 8    12/09/2018  Height 5\' 6"  (1.676 m)  Weight 201 lb (91.2 kg)  BMI (Calculated) 32.46   Body Fat % 44.6 %  Total Body Water (lbs) 78.8 lbs    ASK: We discussed the diagnosis of obesity with [name] today and [name] agreed to give [name] permission to discuss obesity behavioral modification therapy today.  ASSESS: [name] has the diagnosis of obesity and her BMI today is 32.46 [name] is in the action stage of change   ADVISE: [name] was educated on the multiple health risks of obesity as well as the benefit of weight loss to improve her health. She was advised of the need for long term treatment and the importance of lifestyle modifications to improve her current health and to decrease her risk of future health problems.  AGREE: Multiple dietary modification options and treatment options were discussed and  [place] agreed to follow the recommendations documented in the above note.  ARRANGE: Kafi was educated on the importance of frequent visits to treat obesity as outlined per CMS and USPSTF guidelines and agreed to schedule her next follow up appointment today.  [name], am acting as Location manager for [employer]. Owens Shark, DO  I have reviewed the above documentation for accuracy and completeness, and I agree with the above. -Jearld Lesch, DO

## 2018-12-15 ENCOUNTER — Encounter (INDEPENDENT_AMBULATORY_CARE_PROVIDER_SITE_OTHER): Payer: Self-pay | Admitting: Bariatrics

## 2018-12-23 ENCOUNTER — Encounter (INDEPENDENT_AMBULATORY_CARE_PROVIDER_SITE_OTHER): Payer: Self-pay | Admitting: Bariatrics

## 2018-12-23 ENCOUNTER — Other Ambulatory Visit: Payer: Self-pay

## 2018-12-23 ENCOUNTER — Ambulatory Visit (INDEPENDENT_AMBULATORY_CARE_PROVIDER_SITE_OTHER): Payer: 59 | Admitting: Bariatrics

## 2018-12-23 VITALS — BP 120/68 | HR 52 | Temp 97.9°F | Ht 66.0 in | Wt 200.0 lb

## 2018-12-23 DIAGNOSIS — Z9189 Other specified personal risk factors, not elsewhere classified: Secondary | ICD-10-CM | POA: Diagnosis not present

## 2018-12-23 DIAGNOSIS — E669 Obesity, unspecified: Secondary | ICD-10-CM

## 2018-12-23 DIAGNOSIS — E559 Vitamin D deficiency, unspecified: Secondary | ICD-10-CM

## 2018-12-23 DIAGNOSIS — E8881 Metabolic syndrome: Secondary | ICD-10-CM

## 2018-12-23 DIAGNOSIS — Z6832 Body mass index (BMI) 32.0-32.9, adult: Secondary | ICD-10-CM

## 2018-12-23 MED ORDER — METFORMIN HCL 1000 MG PO TABS: 1000.0000 mg | ORAL_TABLET | Freq: Two times a day (BID) | ORAL | 0 refills | Status: DC

## 2018-12-23 MED ORDER — VITAMIN D (ERGOCALCIFEROL) 1.25 MG (50000 UNIT) PO CAPS: 50000.0000 [IU] | ORAL_CAPSULE | ORAL | 0 refills | Status: DC

## 2018-12-23 MED FILL — metFORMIN HCL 1000 MG TABS: 1000 | 30 days supply | Qty: 60 | Fill #0

## 2018-12-23 NOTE — Progress Notes (Signed)
Office: [phone]  /  Fax: [phone]   HPI:   Chief Complaint: OBESITY [name] is here to discuss her progress with her obesity treatment plan. She is on the Category 2 plan and is following her eating plan approximately 80% of the time. She states she is exercising 0 minutes 0 times per week. [name] is down 1 lb. She reports having had some issues with a sweet tooth but states she is doing well with her water intake. Her weight is 200 lb (90.7 kg) today and has had a weight loss of 1 pound over a period of 2 weeks since her last visit. She has lost 9 lbs since starting treatment with [name].  Vitamin D deficiency [name] has a diagnosis of Vitamin D deficiency. She is currently taking prescription Vit D and denies nausea, vomiting or muscle weakness.  At risk for osteopenia and osteoporosis [name] is at higher risk of osteopenia and osteoporosis due to Vitamin D deficiency.   Insulin Resistance [name] has a diagnosis of insulin resistance based on her elevated fasting insulin level >5. Although Dalynn's blood glucose readings are still under good control, insulin resistance puts her at greater risk of metabolic syndrome and diabetes. She is taking metformin currently and continues to work on diet and exercise to decrease risk of diabetes. No polyphagia.  ASSESSMENT AND PLAN:  Vitamin D deficiency - Plan: Vitamin D, Ergocalciferol, (DRISDOL) 1.25 MG (50000 UT) CAPS capsule  Insulin resistance - Plan: metFORMIN (GLUCOPHAGE) 1000 MG tablet  At risk for osteoporosis  Class 1 obesity with serious comorbidity and body mass index (BMI) of 32.0 to 32.9 in adult, unspecified obesity type  PLAN:  Vitamin D Deficiency [name] was informed that low Vitamin D levels contributes to fatigue and are associated with obesity, breast, and colon cancer. She agrees to continue to take prescription Vit D @ 50,000 IU every week #4 with 0 refills and will follow-up for routine testing of Vitamin D, at least 2-3  times per year. She was informed of the risk of over-replacement of Vitamin D and agrees to not increase her dose unless she discusses this with [name] first. Jaiyla agrees to follow-up with our clinic in 3 weeks.  At risk for osteopenia and osteoporosis [name] was given extended  (15 minutes) osteoporosis prevention counseling today. [name] is at risk for osteopenia and osteoporosis due to her Vitamin D deficiency. She was encouraged to take her Vitamin D and follow her higher calcium diet and increase strengthening exercise to help strengthen her bones and decrease her risk of osteopenia and osteoporosis.  Insulin Resistance [name] will continue to work on weight loss, exercise, and decreasing simple carbohydrates in her diet to help decrease the risk of diabetes. We dicussed metformin including benefits and risks. She was informed that eating too many simple carbohydrates or too many calories at one sitting increases the likelihood of GI side effects. [name] was given a refill on her metformin 1,000 mg BID with meals #60 with 0 refills. She agrees to follow-up with our clinic in 3 weeks.  Obesity [name] is currently in the action stage of change. As such, her goal is to continue with weight loss efforts. She has agreed to follow the Category 2 plan. [name] will work on meal planning, intentional eating, and increasing protein. [name] has been instructed to be more active and will get back to the "Y" for weight loss and overall health benefits. We discussed the following Behavioral Modification Strategies today: increasing lean protein intake,  decreasing simple carbohydrates, increasing vegetables, increase H20 intake, decrease eating out, no skipping meals, work on meal planning and easy cooking plans, keeping healthy foods in the home, and planning for success.  [name] has agreed to follow-up with our clinic in 3 weeks. She was informed of the importance of frequent follow-up visits to maximize her success  with intensive lifestyle modifications for her multiple health conditions.  ALLERGIES: Allergies  Allergen Reactions   Codeine Nausea And Vomiting   Sodium Thiosalicylate     UNSPECIFIED REACTION    Ciprofloxacin Other (See Comments)    FATIGUE   Codone [Hydrocodone Bitartrate] Itching   Daypro [Oxaprozin] Rash   Levofloxacin Other (See Comments)   Stadol [Butorphanol Tartrate] Other (See Comments)    ALTERED MENTAL STATUS   Sulfa Antibiotics Rash    MEDICATIONS: Current Outpatient Medications on File Prior to Visit  Medication Sig Dispense Refill   calcium carbonate (CALCIUM 600) 600 MG TABS tablet Take by mouth.     Diclofenac Sodium (PENNSAID) 2 % SOLN Pennsaid 20 mg/gram/actuation (2 %) topical soln in metered-dose pump  APPLY 2 PUMPS (40 MG) TO THE AFFECTED AREA BY TOPICAL ROUTE 2 TIMES PER DAY     docusate sodium (COLACE) 100 MG capsule Take 200 mg by mouth daily as needed for mild constipation.     escitalopram (LEXAPRO) 20 MG tablet Take 10 mg by mouth daily.      leuprolide (LUPRON DEPOT, 63-MONTH,) 11.25 MG injection Inject into the muscle.     metFORMIN (GLUCOPHAGE) 1000 MG tablet Take 1 tablet (1,000 mg total) by mouth 2 (two) times daily with a meal. 60 tablet 0   Multiple Vitamin (MULTIVITAMIN) capsule Take 1 capsule by mouth daily.     omeprazole (PRILOSEC) 20 MG capsule Take 20 mg by mouth daily as needed (heartburn).      Probiotic Product (ALIGN PO) Take 1 tablet by mouth daily.      Vitamin D, Ergocalciferol, (DRISDOL) 1.25 MG (50000 UT) CAPS capsule Take 1 capsule (50,000 Units total) by mouth every 7 (seven) days. 4 capsule 0   No current facility-administered medications on file prior to visit.     PAST MEDICAL HISTORY: Past Medical History:  Diagnosis Date   Anxiety    Arthritis    Cervical syndrome    CKD (chronic kidney disease), stage III (HCC)    Constipation    Fatty liver    GERD (gastroesophageal reflux disease)     Granulosa cell tumor of ovary    History of hiatal hernia    Joint pain    Obesity    Osteoarthritis    Ovarian cancer ([place])    PONV (postoperative nausea and vomiting)    history of n/v,  past surgeries no n/v   Sleep apnea     PAST SURGICAL HISTORY: Past Surgical History:  Procedure Laterality Date   ABDOMINAL HYSTERECTOMY     TAH/BSO   APPENDECTOMY     EXPLORATORY LAPAROTOMY     for bowel obstruction   Secondary tumor debulking  2002   SHOULDER SURGERY     2017 left   Tumor debulking  2010   VENTRAL HERNIA REPAIR      SOCIAL HISTORY: Social History   Tobacco Use   Smoking status: Never Smoker   Smokeless tobacco: Never Used  Substance Use Topics   Alcohol use: Yes    Comment: One 8oz glass, socially   Drug use: No    FAMILY HISTORY: Family History  Problem Relation Age of Onset   Basal cell carcinoma Mother    Thyroid disease Mother    Stroke Father    Colon cancer Father    Hypertension Father    Heart disease Father    Colon cancer Paternal Uncle    ROS: Review of Systems  Gastrointestinal: Negative for nausea and vomiting.  Musculoskeletal:       Negative for muscle weakness.  Endo/Heme/Allergies:       Negative for polyphagia.   PHYSICAL EXAM: Blood pressure 120/68, pulse (!) 52, temperature 97.9 F (36.6 C), temperature source Oral, height 5\' 6"  (1.676 m), weight 200 lb (90.7 kg), SpO2 98 %. Body mass index is 32.28 kg/m. Physical Exam Vitals signs reviewed.  Constitutional:      Appearance: Normal appearance. She is obese.  Cardiovascular:     Rate and Rhythm: Normal rate.     Pulses: Normal pulses.  Pulmonary:     Effort: Pulmonary effort is normal.     Breath sounds: Normal breath sounds.  Musculoskeletal: Normal range of motion.  Skin:    General: Skin is warm and dry.  Neurological:     Mental Status: She is alert and oriented to person, place, and time.  Psychiatric:        Behavior: Behavior normal.     RECENT LABS AND TESTS: BMET    Component Value Date/Time   NA 141 11/19/2018 0945   NA 141 09/24/2013 1131   K 4.7 11/19/2018 0945   K 4.5 09/24/2013 1131   CL 100 11/19/2018 0945   CO2 23 11/19/2018 0945   CO2 24 09/24/2013 1131   GLUCOSE 82 11/19/2018 0945   GLUCOSE 110 (H) 04/10/2018 1308   GLUCOSE 90 09/24/2013 1131   BUN 23 11/19/2018 0945   BUN 21.2 09/24/2013 1131   CREATININE 1.11 (H) 11/19/2018 0945   CREATININE 1.18 (H) 09/02/2018 0917   CREATININE 1.1 09/24/2013 1131   CALCIUM 9.7 11/19/2018 0945   CALCIUM 9.5 09/24/2013 1131   GFRNONAA 54 (L) 11/19/2018 0945   GFRNONAA 50 (L) 09/02/2018 0917   GFRAA 62 11/19/2018 0945   GFRAA 58 (L) 09/02/2018 0917   Lab Results  Component Value Date   HGBA1C 5.2 11/19/2018   HGBA1C 5.5 04/29/2018   Lab Results  Component Value Date   INSULIN 17.6 11/19/2018   INSULIN 25.8 (H) 04/29/2018   CBC    Component Value Date/Time   WBC 5.3 04/29/2018 1009   WBC 6.1 10/04/2016 0713   RBC 4.68 04/29/2018 1009   RBC 4.55 10/04/2016 0713   HGB 14.5 04/29/2018 1009   HCT 42.5 04/29/2018 1009   PLT 268 10/04/2016 0713   MCV 91 04/29/2018 1009   MCH 31.0 04/29/2018 1009   MCH 31.6 10/04/2016 0713   MCHC 34.1 04/29/2018 1009   MCHC 35.4 10/04/2016 0713   RDW 12.3 04/29/2018 1009   LYMPHSABS 1.4 04/29/2018 1009   MONOABS 0.5 04/13/2009 1005   EOSABS 0.2 04/29/2018 1009   BASOSABS 0.0 04/29/2018 1009   Iron/TIBC/Ferritin/ %Sat No results found for: IRON, TIBC, FERRITIN, IRONPCTSAT Lipid Panel     Component Value Date/Time   CHOL 224 (H) 11/19/2018 0945   TRIG 200 (H) 11/19/2018 0945   HDL 49 11/19/2018 0945   LDLCALC 135 (H) 11/19/2018 0945   Hepatic Function Panel     Component Value Date/Time   PROT 6.3 11/19/2018 0945   PROT 6.8 09/24/2013 1131   ALBUMIN 4.3 11/19/2018 0945   ALBUMIN 3.9  09/24/2013 1131   AST 19 11/19/2018 0945   AST 23 09/24/2013 1131   ALT 15 11/19/2018 0945   ALT 27 09/24/2013 1131    ALKPHOS 73 11/19/2018 0945   ALKPHOS 81 09/24/2013 1131   BILITOT 0.2 11/19/2018 0945   BILITOT 0.53 09/24/2013 1131      Component Value Date/Time   TSH 0.564 04/29/2018 1009   Results for TEZRA, CROFFORD (MRN [id number]) as of 12/23/2018 09:24  Ref. Range 11/19/2018 09:45  Vitamin D, 25-Hydroxy Latest Ref Range: 30.0 - 100.0 ng/mL 42.8   OBESITY BEHAVIORAL INTERVENTION VISIT  Today's visit was #15  Starting weight: 209 lbs Starting date: 04/29/2018 Today's weight: 200 lbs Today's date: 12/23/2018 Total lbs lost to date: 9    12/23/2018  Height 5\' 6"  (1.676 m)  Weight 200 lb (90.7 kg)  BMI (Calculated) 32.3  BLOOD PRESSURE - SYSTOLIC [id number]  BLOOD PRESSURE - DIASTOLIC 68   Body Fat % [id number] %  Total Body Water (lbs) 76.2 lbs   ASK: We discussed the diagnosis of obesity with [name] today and Larayne agreed to give [name] permission to discuss obesity behavioral modification therapy today.  ASSESS: Shaeleigh has the diagnosis of obesity and her BMI today is 32.3. Nicosha is in the action stage of change.   ADVISE: Matasha was educated on the multiple health risks of obesity as well as the benefit of weight loss to improve her health. She was advised of the need for long term treatment and the importance of lifestyle modifications to improve her current health and to decrease her risk of future health problems.  AGREE: Multiple dietary modification options and treatment options were discussed and  Ashiah agreed to follow the recommendations documented in the above note.  ARRANGE: Deone was educated on the importance of frequent visits to treat obesity as outlined per CMS and USPSTF guidelines and agreed to schedule her next follow up appointment today.  Migdalia Dk, am acting as Location manager for [employer], DO  I have reviewed the above documentation for accuracy and completeness, and I agree with the above. -Jearld Lesch, DO

## 2019-01-05 ENCOUNTER — Inpatient Hospital Stay: Payer: 59 | Attending: Gynecology

## 2019-01-05 ENCOUNTER — Other Ambulatory Visit: Payer: Self-pay

## 2019-01-05 DIAGNOSIS — Z79818 Long term (current) use of other agents affecting estrogen receptors and estrogen levels: Secondary | ICD-10-CM | POA: Diagnosis not present

## 2019-01-05 DIAGNOSIS — C569 Malignant neoplasm of unspecified ovary: Secondary | ICD-10-CM | POA: Diagnosis not present

## 2019-01-07 DIAGNOSIS — G4733 Obstructive sleep apnea (adult) (pediatric): Secondary | ICD-10-CM | POA: Diagnosis not present

## 2019-01-07 LAB — INHIBIN B: Inhibin B: 92 pg/mL — ABNORMAL HIGH (ref 0.0–16.9)

## 2019-01-09 LAB — ANTI MULLERIAN HORMONE: ANTI-MULLERIAN HORMONE (AMH): 6.84 ng/mL

## 2019-01-11 MED FILL — ESCITALOPRAM 20 MG TABLET: 20 | 90 days supply | Qty: 90 | Fill #2

## 2019-01-13 ENCOUNTER — Encounter: Payer: Self-pay | Admitting: Gynecology

## 2019-01-13 ENCOUNTER — Other Ambulatory Visit: Payer: Self-pay | Admitting: Gynecologic Oncology

## 2019-01-13 ENCOUNTER — Telehealth: Payer: Self-pay | Admitting: Gynecology

## 2019-01-13 ENCOUNTER — Inpatient Hospital Stay (HOSPITAL_BASED_OUTPATIENT_CLINIC_OR_DEPARTMENT_OTHER): Payer: 59 | Admitting: Gynecology

## 2019-01-13 DIAGNOSIS — D3910 Neoplasm of uncertain behavior of unspecified ovary: Secondary | ICD-10-CM

## 2019-01-13 DIAGNOSIS — C569 Malignant neoplasm of unspecified ovary: Secondary | ICD-10-CM

## 2019-01-13 DIAGNOSIS — R7989 Other specified abnormal findings of blood chemistry: Secondary | ICD-10-CM

## 2019-01-13 NOTE — Progress Notes (Signed)
Virtual Visit via Telephone Note  I connected with [name] on 01/13/19 at  8:45 AM EDT by telephone and verified that I am speaking with the correct person using two identifiers.  Location: Patient: [name] Provider: Gaspar Cola   I discussed the limitations, risks, security and privacy concerns of performing an evaluation and management service by telephone and the availability of in person appointments. I also discussed with the patient that there may be a patient responsible charge related to this service. The patient expressed understanding and agreed to proceed.   I discussed the assessment and treatment plan with the patient. The patient was provided an opportunity to ask questions and all were answered. The patient agreed with the plan and demonstrated an understanding of the instructions.   The patient was advised to call back or seek an in-person evaluation if the symptoms worsen or if the condition fails to improve as anticipated.  I provided 20 minutes of non-face-to-face time during this encounter.       Consult Note: Gyn-Onc Patient education given on June 9 and the patient expresses understanding and acceptance of instructions. [name] 01/13/2019 9:15 AM;  [name] 63 y.o. 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 The patient is asymptomatic except for some lower back pain ("iliac" pain according to the patient) for which she takes naproxasyn at bedtime.  In order to further evaluate disease status, we will obtain an MRI of the abdomen and pelvis and a CXR.   Pending those results we will consider other treatment options. Lupron will be discontinued.    Interval History: The patient returns today as previously scheduled for telephone followup.  She has now received 21 months of Lupron and has tolerating it well. Unfortunately recent evaluation of tumor markers shows both are  rising. AMH 6.84 (previously 3.72) Inhibin B  92 (previously 70.9)   From the perspective of her granulosa cell tumor, overall, the patient is entirely asymptomatic and her functional status is excellent. She does have some low back ("iliac") pain.  She takes an NSAID at bedtime. 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 Otherwise she specifically denies any GI, GU, or pelvic symptoms.  HPI:The patient has a long-standing history of a granulosa cell tumor of the ovary dating back to 82. She had a recurrence in 2002 and the upper abdomen which was resected and she was subsequently treated with 6 cycles of intraperitoneal cisplatin and etoposide completed in June of 2002. She was followed between 2002 in 2010 with CT scans. In November 2010 CT scan showed increased size of nodules in the pelvis. At that time she underwent exploratory laparotomy with resection of tumor nodules near the cecum and left pelvic sidewall.. The tumor was estrogen receptor negative and progesterone receptor positive. She alternated two-week courses of Megace and tamoxifen between June and December 2013. At that time CT scan showed progressive disease and the patient was switched to letrozole.  In October 2017 the patient was found to have progressive disease on MRI and her management regimen was changed to tamoxifen 20 mg daily for 2 weeks alternating with Megace 40 mg 3 times a day for 2 weeks.  Follow-up MRI in May 2018 showed progressive disease with peritoneal implants near the liver and pelvis. In October 2018, the treatment was changed to Lupron 11. 2 5 mg q 3 months.   We requested a core biopsy  for assessment of ER/PR and Foundation One testing.  Unfortunately, there was not enough tissue for Foundation One testing.  ER was 50% and PR 90%. Review of the literature seeking other treatments revealed possible alternative treatments including: Lupron Bevacizamab Letrozole 2.5 mg plus Metformin 500  mg Carbo/Taxol Everolimus plus exemestane  In October 2018 we began treatment with Lupron 11.25 mg IM every 3 months.  After 3 months she had a nice initial response based on MRI findings.  After 9 months she had a mixed response on MRI and rising tumor markers. In September 2020 her tumor markers became elevated and we obtained an MRI and CXR to assess disease status.   Review of Systems:10 point review of systems is negative as noted above.   Vitals: There were no vitals taken for this visit.  P hysical Exam: Not performed:  Phone visit       Allergies  Allergen Reactions  . Codeine Nausea And Vomiting  . Sodium Thiosalicylate     UNSPECIFIED REACTION   . Ciprofloxacin Other (See Comments)    FATIGUE  . Codone [Hydrocodone Bitartrate] Itching  . Daypro [Oxaprozin] Rash  . Levofloxacin Other (See Comments)  . Stadol [Butorphanol Tartrate] Other (See Comments)    ALTERED MENTAL STATUS  . Sulfa Antibiotics Rash    Past Medical History:  Diagnosis Date  . Anxiety   . Arthritis   . Cervical syndrome   . CKD (chronic kidney disease), stage III ([place])   . Constipation   . Fatty liver   . GERD (gastroesophageal reflux disease)   . Granulosa cell tumor of ovary   . History of hiatal hernia   . Joint pain   . Obesity   . Osteoarthritis   . Ovarian cancer ([place])   . PONV (postoperative nausea and vomiting)    history of n/v,  past surgeries no n/v  . Sleep apnea     Past Surgical History:  Procedure Laterality Date  . ABDOMINAL HYSTERECTOMY     TAH/BSO  . APPENDECTOMY    . EXPLORATORY LAPAROTOMY     for bowel obstruction  . Secondary tumor debulking  2002  . SHOULDER SURGERY     2017 left  . Tumor debulking  2010  . VENTRAL HERNIA REPAIR      Current Outpatient Medications  Medication Sig Dispense Refill  . calcium carbonate (CALCIUM 600) 600 MG TABS tablet Take by mouth.    . Diclofenac Sodium (PENNSAID) 2 % SOLN Pennsaid 20 mg/gram/actuation (2 %) topical  soln in metered-dose pump  APPLY 2 PUMPS (40 MG) TO THE AFFECTED AREA BY TOPICAL ROUTE 2 TIMES PER DAY    . docusate sodium (COLACE) 100 MG capsule Take 200 mg by mouth daily as needed for mild constipation.    Marland Kitchen escitalopram (LEXAPRO) 20 MG tablet Take 10 mg by mouth daily.     Marland Kitchen leuprolide (LUPRON DEPOT, 36-MONTH,) 11.25 MG injection Inject into the muscle.    . metFORMIN (GLUCOPHAGE) 1000 MG tablet Take 1 tablet (1,000 mg total) by mouth 2 (two) times daily with a meal. 60 tablet 0  . Multiple Vitamin (MULTIVITAMIN) capsule Take 1 capsule by mouth daily.    Marland Kitchen omeprazole (PRILOSEC) 20 MG capsule Take 20 mg by mouth daily as needed (heartburn).     . Probiotic Product (ALIGN PO) Take 1 tablet by mouth daily.     . Vitamin D, Ergocalciferol, (DRISDOL) 1.25 MG (50000 UT) CAPS capsule Take 1 capsule (50,000 Units total) by  mouth every 7 (seven) days. 4 capsule 0   No current facility-administered medications for this visit.     Social History   Socioeconomic History  . Marital status: Married    Spouse name: Brihana Zale  . Number of children: 2  . Years of education: Not on file  . Highest education level: Not on file  Occupational History  . Occupation: Therapist, sports  Social Needs  . Financial resource strain: Not on file  . Food insecurity    Worry: Not on file    Inability: Not on file  . Transportation needs    Medical: Not on file    Non-medical: Not on file  Tobacco Use  . Smoking status: Never Smoker  . Smokeless tobacco: Never Used  Substance and Sexual Activity  . Alcohol use: Yes    Comment: One 8oz glass, socially  . Drug use: No  . Sexual activity: Not Currently  Lifestyle  . Physical activity    Days per week: Not on file    Minutes per session: Not on file  . Stress: Not on file  Relationships  . Social Herbalist on phone: Not on file    Gets together: Not on file    Attends religious service: Not on file    Active member of club or organization: Not on  file    Attends meetings of clubs or organizations: Not on file    Relationship status: Not on file  . Intimate partner violence    Fear of current or ex partner: Not on file    Emotionally abused: Not on file    Physically abused: Not on file    Forced sexual activity: Not on file  Other Topics Concern  . Not on file  Social History Narrative  . Not on file    Family History  Problem Relation Age of Onset  . Basal cell carcinoma Mother   . Thyroid disease Mother   . Stroke Father   . Colon cancer Father   . Hypertension Father   . Heart disease Father   . Colon cancer Paternal Uncle       Jacqueline Sleigh, MD 01/13/2019, 9:15 AM            I discussed the assessment and treatment plan with the patient. The patient was provided an opportunity to ask questions and all were answered. The patient agreed with the plan and demonstrated an understanding of the instructions.   The patient was advised to call back or seek an in-person evaluation if the symptoms worsen or if the condition fails to improve as anticipated.  I provided 20 minutes of non-face-to-face time during this encounter.  Cable Fearn L. Fermin Schwab, MD   Jacqueline Sleigh, MD

## 2019-01-13 NOTE — Patient Instructions (Signed)
We will obtain an MRI of the abdomen and pelvis and a CXR.  Pending those results will consider other treatment options

## 2019-01-13 NOTE — Telephone Encounter (Signed)
See progress note/phone visit

## 2019-01-13 NOTE — Progress Notes (Signed)
See Dr. Lunette Stands office visit from 9/23. Scans ordered to evaluate extent of recurrent granulosa cell tumor with increasing tumor markers.

## 2019-01-14 ENCOUNTER — Other Ambulatory Visit: Payer: Self-pay

## 2019-01-14 ENCOUNTER — Inpatient Hospital Stay: Payer: 59

## 2019-01-14 DIAGNOSIS — C569 Malignant neoplasm of unspecified ovary: Secondary | ICD-10-CM

## 2019-01-14 DIAGNOSIS — Z79818 Long term (current) use of other agents affecting estrogen receptors and estrogen levels: Secondary | ICD-10-CM | POA: Diagnosis not present

## 2019-01-14 DIAGNOSIS — R7989 Other specified abnormal findings of blood chemistry: Secondary | ICD-10-CM

## 2019-01-14 LAB — BASIC METABOLIC PANEL
Anion gap: 11 (ref 5–15)
BUN: 24 mg/dL — ABNORMAL HIGH (ref 8–23)
CO2: 25 mmol/L (ref 22–32)
Calcium: 9.1 mg/dL (ref 8.9–10.3)
Chloride: 104 mmol/L (ref 98–111)
Creatinine, Ser: 1.03 mg/dL — ABNORMAL HIGH (ref 0.44–1.00)
GFR calc Af Amer: >60 mL/min (ref >60)
GFR calc non Af Amer: 58 mL/min — ABNORMAL LOW (ref >60)
Glucose, Bld: 82 mg/dL (ref 70–99)
Potassium: 4.5 mmol/L (ref 3.5–5.1)
Sodium: 140 mmol/L (ref 135–145)

## 2019-01-18 ENCOUNTER — Ambulatory Visit (HOSPITAL_COMMUNITY): Admission: RE | Admit: 2019-01-18 | Payer: 59 | Source: Ambulatory Visit

## 2019-01-19 ENCOUNTER — Other Ambulatory Visit: Payer: Self-pay

## 2019-01-19 ENCOUNTER — Ambulatory Visit (HOSPITAL_COMMUNITY)
Admission: RE | Admit: 2019-01-19 | Discharge: 2019-01-19 | Disposition: A | Discharge status: Home / Self Care | Payer: 59 | Source: Ambulatory Visit | Attending: Gynecologic Oncology | Admitting: Gynecologic Oncology

## 2019-01-19 DIAGNOSIS — C569 Malignant neoplasm of unspecified ovary: Secondary | ICD-10-CM | POA: Diagnosis not present

## 2019-01-19 DIAGNOSIS — R7989 Other specified abnormal findings of blood chemistry: Secondary | ICD-10-CM | POA: Insufficient documentation

## 2019-01-27 DIAGNOSIS — M2022 Hallux rigidus, left foot: Secondary | ICD-10-CM | POA: Diagnosis not present

## 2019-01-27 DIAGNOSIS — M79672 Pain in left foot: Secondary | ICD-10-CM | POA: Diagnosis not present

## 2019-01-28 ENCOUNTER — Other Ambulatory Visit: Payer: Self-pay

## 2019-01-28 ENCOUNTER — Ambulatory Visit (HOSPITAL_COMMUNITY)
Admission: RE | Admit: 2019-01-28 | Discharge: 2019-01-28 | Disposition: A | Discharge status: Home / Self Care | Payer: 59 | Source: Ambulatory Visit | Attending: Gynecologic Oncology | Admitting: Gynecologic Oncology

## 2019-01-28 DIAGNOSIS — C569 Malignant neoplasm of unspecified ovary: Secondary | ICD-10-CM | POA: Insufficient documentation

## 2019-01-28 DIAGNOSIS — I700 Atherosclerosis of aorta: Secondary | ICD-10-CM | POA: Diagnosis not present

## 2019-01-28 DIAGNOSIS — N281 Cyst of kidney, acquired: Secondary | ICD-10-CM | POA: Diagnosis not present

## 2019-01-28 DIAGNOSIS — D7389 Other diseases of spleen: Secondary | ICD-10-CM | POA: Diagnosis not present

## 2019-01-28 DIAGNOSIS — R7989 Other specified abnormal findings of blood chemistry: Secondary | ICD-10-CM | POA: Diagnosis not present

## 2019-01-28 DIAGNOSIS — K760 Fatty (change of) liver, not elsewhere classified: Secondary | ICD-10-CM | POA: Diagnosis not present

## 2019-01-28 DIAGNOSIS — D3502 Benign neoplasm of left adrenal gland: Secondary | ICD-10-CM | POA: Diagnosis not present

## 2019-01-28 MED ORDER — GADOBUTROL 1 MMOL/ML IV SOLN
10.0000 mL | Freq: Once | INTRAVENOUS | Status: AC | PRN
  Administered 2019-01-28: 9 mL via INTRAVENOUS

## 2019-02-04 ENCOUNTER — Encounter (INDEPENDENT_AMBULATORY_CARE_PROVIDER_SITE_OTHER): Payer: Self-pay | Admitting: Bariatrics

## 2019-02-04 ENCOUNTER — Ambulatory Visit (INDEPENDENT_AMBULATORY_CARE_PROVIDER_SITE_OTHER): Payer: 59 | Admitting: Bariatrics

## 2019-02-04 ENCOUNTER — Other Ambulatory Visit: Payer: Self-pay

## 2019-02-04 VITALS — BP 123/70 | HR 60 | Temp 97.7°F | Wt 197.0 lb

## 2019-02-04 DIAGNOSIS — Z6831 Body mass index (BMI) 31.0-31.9, adult: Secondary | ICD-10-CM | POA: Diagnosis not present

## 2019-02-04 DIAGNOSIS — E8881 Metabolic syndrome: Secondary | ICD-10-CM | POA: Diagnosis not present

## 2019-02-04 DIAGNOSIS — E559 Vitamin D deficiency, unspecified: Secondary | ICD-10-CM | POA: Diagnosis not present

## 2019-02-04 DIAGNOSIS — E669 Obesity, unspecified: Secondary | ICD-10-CM

## 2019-02-04 DIAGNOSIS — Z9189 Other specified personal risk factors, not elsewhere classified: Secondary | ICD-10-CM | POA: Diagnosis not present

## 2019-02-04 MED ORDER — METFORMIN HCL 1000 MG PO TABS: 1000.0000 mg | ORAL_TABLET | Freq: Two times a day (BID) | ORAL | 0 refills | Status: DC

## 2019-02-04 MED ORDER — VITAMIN D (ERGOCALCIFEROL) 1.25 MG (50000 UNIT) PO CAPS: 50000.0000 [IU] | ORAL_CAPSULE | ORAL | 0 refills | Status: DC

## 2019-02-04 MED FILL — VIT D2 1.25 MG (50,000 UNIT: 1.25 MG | 28 days supply | Qty: 4 | Fill #0

## 2019-02-04 MED FILL — metFORMIN HCL 1000 MG TABS: 1000 | 30 days supply | Qty: 60 | Fill #0

## 2019-02-08 ENCOUNTER — Other Ambulatory Visit: Payer: Self-pay | Admitting: Gynecologic Oncology

## 2019-02-08 ENCOUNTER — Encounter (INDEPENDENT_AMBULATORY_CARE_PROVIDER_SITE_OTHER): Payer: Self-pay | Admitting: Bariatrics

## 2019-02-08 DIAGNOSIS — C569 Malignant neoplasm of unspecified ovary: Secondary | ICD-10-CM

## 2019-02-08 NOTE — Progress Notes (Signed)
Lupron 11.25 mg injection per Dr. Fermin Schwab for recurrent granulosa cell tumor of the ovary.

## 2019-02-08 NOTE — Progress Notes (Signed)
Office: [phone]  /  Fax: [phone]   HPI:   Chief Complaint: OBESITY [name] is here to discuss her progress with her obesity treatment plan. She is on the Category 2 plan and is following her eating plan approximately 80% of the time. She states she is walking/yoga 60 minutes 1 time per week. [name] is down 3 lbs. She reports eating more sweets and carbohydrates. Her weight is 197 lb (89.4 kg) today and has had a weight loss of 3 pounds over a period of 6 weeks since her last visit. She has lost 12 lbs since starting treatment with [name].  Insulin Resistance [name] has a diagnosis of insulin resistance based on her elevated fasting insulin level >5. Last insulin 17.6 on 11/19/2018. Although [name]'s blood glucose readings are still under good control, insulin resistance puts her at greater risk of metabolic syndrome and diabetes. She is taking metformin currently and continues to work on diet and exercise to decrease risk of diabetes. No polyphagia.  At risk for diabetes [name] is at higher than average risk for developing diabetes due to her obesity. She currently denies polyuria or polydipsia.  Vitamin D deficiency [name] has a diagnosis of Vitamin D deficiency. Last Vitamin D 42.8 on 11/19/2018. She is currently taking prescription Vit D and denies nausea, vomiting or muscle weakness.  ASSESSMENT AND PLAN:  Insulin resistance - Plan: metFORMIN (GLUCOPHAGE) 1000 MG tablet  Vitamin D deficiency - Plan: Vitamin D, Ergocalciferol, (DRISDOL) 1.25 MG (50000 UT) CAPS capsule  At risk for diabetes mellitus  Class 1 obesity with serious comorbidity and body mass index (BMI) of 31.0 to 31.9 in adult, unspecified obesity type  PLAN:  Insulin Resistance [name] will continue to work on weight loss, exercise, and decreasing simple carbohydrates in her diet to help decrease the risk of diabetes. We dicussed metformin including benefits and risks. She was informed that eating too many simple  carbohydrates or too many calories at one sitting increases the likelihood of GI side effects. [name] was instructed to decrease carbohydrates, increase protein, and increase activity. She was given a prescription for metformin 1,000 mg 1 PO BID with meals #60 with 0 refills. She agrees to follow-up with our clinic in 2 weeks.  Diabetes risk counseling [name] was given extended (15 minutes) diabetes prevention counseling today. She is 63 y.o. female and has risk factors for diabetes including obesity. We discussed intensive lifestyle modifications today with an emphasis on weight loss as well as increasing exercise and decreasing simple carbohydrates in her diet.  Vitamin D Deficiency [name] was informed that low Vitamin D levels contributes to fatigue and are associated with obesity, breast, and colon cancer. She agrees to continue to take prescription Vit D @ 50,000 IU every week #4 with 0 refills and will follow-up for routine testing of Vitamin D, at least 2-3 times per year. She was informed of the risk of over-replacement of Vitamin D and agrees to not increase her dose unless she discusses this with [name] first. [name] agrees to follow-up with our clinic in 2 weeks.  Obesity [name] is currently in the action stage of change. As such, her goal is to continue with weight loss efforts. She has agreed to follow the Category 2 plan. [name] will work on meal planning, intentional eating, and increasing her water intake. [name] has been instructed to continue to exercise and increase to twice a week for weight loss and overall health benefits. We discussed the following Behavioral Modification Strategies today: increasing  lean protein intake, decreasing simple carbohydrates, increasing vegetables, increase H20 intake, decrease eating out, no skipping meals, work on meal planning and easy cooking plans, keeping healthy foods in the home, and planning for success.  [name] has agreed to follow up with our clinic  in 2 weeks. She was informed of the importance of frequent follow up visits to maximize her success with intensive lifestyle modifications for her multiple health conditions.  ALLERGIES: Allergies  Allergen Reactions   Codeine Nausea And Vomiting   Sodium Thiosalicylate     UNSPECIFIED REACTION    Ciprofloxacin Other (See Comments)    FATIGUE   Codone [Hydrocodone Bitartrate] Itching   Daypro [Oxaprozin] Rash   Levofloxacin Other (See Comments)   Stadol [Butorphanol Tartrate] Other (See Comments)    ALTERED MENTAL STATUS   Sulfa Antibiotics Rash    MEDICATIONS: Current Outpatient Medications on File Prior to Visit  Medication Sig Dispense Refill   calcium carbonate (CALCIUM 600) 600 MG TABS tablet Take by mouth.     Diclofenac Sodium (PENNSAID) 2 % SOLN Pennsaid 20 mg/gram/actuation (2 %) topical soln in metered-dose pump  APPLY 2 PUMPS (40 MG) TO THE AFFECTED AREA BY TOPICAL ROUTE 2 TIMES PER DAY     docusate sodium (COLACE) 100 MG capsule Take 200 mg by mouth daily as needed for mild constipation.     escitalopram (LEXAPRO) 20 MG tablet Take 10 mg by mouth daily.      leuprolide (LUPRON DEPOT, 76-MONTH,) 11.25 MG injection Inject into the muscle.     Multiple Vitamin (MULTIVITAMIN) capsule Take 1 capsule by mouth daily.     omeprazole (PRILOSEC) 20 MG capsule Take 20 mg by mouth daily as needed (heartburn).      Probiotic Product (ALIGN PO) Take 1 tablet by mouth daily.      No current facility-administered medications on file prior to visit.     PAST MEDICAL HISTORY: Past Medical History:  Diagnosis Date   Anxiety    Arthritis    Cervical syndrome    CKD (chronic kidney disease), stage III    Constipation    Fatty liver    GERD (gastroesophageal reflux disease)    Granulosa cell tumor of ovary    History of hiatal hernia    Joint pain    Obesity    Osteoarthritis    Ovarian cancer ([place])    PONV (postoperative nausea and vomiting)     history of n/v,  past surgeries no n/v   Sleep apnea     PAST SURGICAL HISTORY: Past Surgical History:  Procedure Laterality Date   ABDOMINAL HYSTERECTOMY     TAH/BSO   APPENDECTOMY     EXPLORATORY LAPAROTOMY     for bowel obstruction   Secondary tumor debulking  2002   SHOULDER SURGERY     2017 left   Tumor debulking  2010   VENTRAL HERNIA REPAIR      SOCIAL HISTORY: Social History   Tobacco Use   Smoking status: Never Smoker   Smokeless tobacco: Never Used  Substance Use Topics   Alcohol use: Yes    Comment: One 8oz glass, socially   Drug use: No    FAMILY HISTORY: Family History  Problem Relation Age of Onset   Basal cell carcinoma Mother    Thyroid disease Mother    Stroke Father    Colon cancer Father    Hypertension Father    Heart disease Father    Colon cancer Paternal Uncle  ROS: Review of Systems  Gastrointestinal: Negative for nausea and vomiting.  Musculoskeletal:       Negative for muscle weakness.  Endo/Heme/Allergies:       Negative for polyphagia.   PHYSICAL EXAM: Blood pressure 123/70, pulse 60, temperature 97.7 F (36.5 C), weight 197 lb (89.4 kg), SpO2 98 %. Body mass index is 31.8 kg/m. Physical Exam Vitals signs reviewed.  Constitutional:      Appearance: Normal appearance. She is obese.  Cardiovascular:     Rate and Rhythm: Normal rate.     Pulses: Normal pulses.  Pulmonary:     Effort: Pulmonary effort is normal.     Breath sounds: Normal breath sounds.  Musculoskeletal: Normal range of motion.  Skin:    General: Skin is warm and dry.  Neurological:     Mental Status: She is alert and oriented to person, place, and time.  Psychiatric:        Behavior: Behavior normal.   RECENT LABS AND TESTS: BMET    Component Value Date/Time   NA 140 01/14/2019 0855   NA 141 11/19/2018 0945   NA 141 09/24/2013 1131   K 4.5 01/14/2019 0855   K 4.5 09/24/2013 1131   CL 104 01/14/2019 0855   CO2 25 01/14/2019  0855   CO2 24 09/24/2013 1131   GLUCOSE 82 01/14/2019 0855   GLUCOSE 90 09/24/2013 1131   BUN 24 (H) 01/14/2019 0855   BUN 23 11/19/2018 0945   BUN 21.2 09/24/2013 1131   CREATININE 1.03 (H) 01/14/2019 0855   CREATININE 1.18 (H) 09/02/2018 0917   CREATININE 1.1 09/24/2013 1131   CALCIUM 9.1 01/14/2019 0855   CALCIUM 9.5 09/24/2013 1131   GFRNONAA 58 (L) 01/14/2019 0855   GFRNONAA 50 (L) 09/02/2018 0917   GFRAA >60 01/14/2019 0855   GFRAA 58 (L) 09/02/2018 0917   Lab Results  Component Value Date   HGBA1C 5.2 11/19/2018   HGBA1C 5.5 04/29/2018   Lab Results  Component Value Date   INSULIN 17.6 11/19/2018   INSULIN 25.8 (H) 04/29/2018   CBC    Component Value Date/Time   WBC 5.3 04/29/2018 1009   WBC 6.1 10/04/2016 0713   RBC 4.68 04/29/2018 1009   RBC 4.55 10/04/2016 0713   HGB 14.5 04/29/2018 1009   HCT 42.5 04/29/2018 1009   PLT 268 10/04/2016 0713   MCV 91 04/29/2018 1009   MCH 31.0 04/29/2018 1009   MCH 31.6 10/04/2016 0713   MCHC 34.1 04/29/2018 1009   MCHC 35.4 10/04/2016 0713   RDW 12.3 04/29/2018 1009   LYMPHSABS 1.4 04/29/2018 1009   MONOABS 0.5 04/13/2009 1005   EOSABS 0.2 04/29/2018 1009   BASOSABS 0.0 04/29/2018 1009   Iron/TIBC/Ferritin/ %Sat No results found for: IRON, TIBC, FERRITIN, IRONPCTSAT Lipid Panel     Component Value Date/Time   CHOL 224 (H) 11/19/2018 0945   TRIG 200 (H) 11/19/2018 0945   HDL 49 11/19/2018 0945   LDLCALC 135 (H) 11/19/2018 0945   Hepatic Function Panel     Component Value Date/Time   PROT 6.3 11/19/2018 0945   PROT 6.8 09/24/2013 1131   ALBUMIN 4.3 11/19/2018 0945   ALBUMIN 3.9 09/24/2013 1131   AST 19 11/19/2018 0945   AST 23 09/24/2013 1131   ALT 15 11/19/2018 0945   ALT 27 09/24/2013 1131   ALKPHOS 73 11/19/2018 0945   ALKPHOS 81 09/24/2013 1131   BILITOT 0.2 11/19/2018 0945   BILITOT 0.53 09/24/2013 1131  Component Value Date/Time   TSH 0.564 04/29/2018 1009   Results for ANNALIESE, SLUPSKI (MRN  [id number]) as of 02/08/2019 15:31  Ref. Range 11/19/2018 09:45  Vitamin D, 25-Hydroxy Latest Ref Range: 30.0 - 100.0 ng/mL 42.8   OBESITY BEHAVIORAL INTERVENTION VISIT  Today's visit was #16  Starting weight: 209 lbs Starting date: 04/29/2018 Today's weight: 197 lbs Today's date: 02/04/2019 Total lbs lost to date: 12    02/04/2019  Weight 197 lb (89.4 kg)  BLOOD PRESSURE - SYSTOLIC [id number]  BLOOD PRESSURE - DIASTOLIC 70   Body Fat % 123XX123 %  Total Body Water (lbs) 73.6 lbs   ASK: We discussed the diagnosis of obesity with [name] today and [name] agreed to give [name] permission to discuss obesity behavioral modification therapy today.  ASSESS: [name] has the diagnosis of obesity and her BMI today is 31.9. [name] is in the action stage of change.   ADVISE: [name] was educated on the multiple health risks of obesity as well as the benefit of weight loss to improve her health. She was advised of the need for long term treatment and the importance of lifestyle modifications to improve her current health and to decrease her risk of future health problems.  AGREE: Multiple dietary modification options and treatment options were discussed and  [name] agreed to follow the recommendations documented in the above note.  ARRANGE: [name] was educated on the importance of frequent visits to treat obesity as outlined per CMS and USPSTF guidelines and agreed to schedule her next follow up appointment today.  Migdalia Dk, am acting as Location manager for [employer], DO  I have reviewed the above documentation for accuracy and completeness, and I agree with the above. -Jearld Lesch, DO

## 2019-02-10 ENCOUNTER — Other Ambulatory Visit: Payer: Self-pay

## 2019-02-10 ENCOUNTER — Other Ambulatory Visit: Payer: Self-pay | Admitting: Gynecologic Oncology

## 2019-02-10 ENCOUNTER — Inpatient Hospital Stay: Payer: 59 | Attending: Gynecology

## 2019-02-10 DIAGNOSIS — C569 Malignant neoplasm of unspecified ovary: Secondary | ICD-10-CM | POA: Insufficient documentation

## 2019-02-10 MED ORDER — LEUPROLIDE ACETATE (3 MONTH) 11.25 MG IM KIT
11.2500 mg | PACK | Freq: Once | INTRAMUSCULAR | Status: AC
  Administered 2019-02-10: 11.25 mg via INTRAMUSCULAR
  Filled 2019-02-10: qty 11.25

## 2019-02-10 NOTE — Patient Instructions (Signed)
Leuprolide injection What is this medicine? LEUPROLIDE (loo PROE lide) is a man-made hormone. It is used to treat the symptoms of prostate cancer. This medicine may also be used to treat children with early onset of puberty. It may be used for other hormonal conditions. This medicine may be used for other purposes; ask your health care provider or pharmacist if you have questions. COMMON BRAND NAME(S): Lupron What should I tell my health care provider before I take this medicine? They need to know if you have any of these conditions:  diabetes  heart disease or previous heart attack  high blood pressure  high cholesterol  pain or difficulty passing urine  spinal cord metastasis  stroke  tobacco smoker  an unusual or allergic reaction to leuprolide, benzyl alcohol, other medicines, foods, dyes, or preservatives  pregnant or trying to get pregnant  breast-feeding How should I use this medicine? This medicine is for injection under the skin or into a muscle. You will be taught how to prepare and give this medicine. Use exactly as directed. Take your medicine at regular intervals. Do not take your medicine more often than directed. It is important that you put your used needles and syringes in a special sharps container. Do not put them in a trash can. If you do not have a sharps container, call your pharmacist or healthcare provider to get one. A special MedGuide will be given to you by the pharmacist with each prescription and refill. Be sure to read this information carefully each time. Talk to your pediatrician regarding the use of this medicine in children. While this medicine may be prescribed for children as young as 8 years for selected conditions, precautions do apply. Overdosage: If you think you have taken too much of this medicine contact a poison control center or emergency room at once. NOTE: This medicine is only for you. Do not share this medicine with others. What if  I miss a dose? If you miss a dose, take it as soon as you can. If it is almost time for your next dose, take only that dose. Do not take double or extra doses. What may interact with this medicine? Do not take this medicine with any of the following medications:  chasteberry This medicine may also interact with the following medications:  herbal or dietary supplements, like black cohosh or DHEA  female hormones, like estrogens or progestins and birth control pills, patches, rings, or injections  female hormones, like testosterone This list may not describe all possible interactions. Give your health care provider a list of all the medicines, herbs, non-prescription drugs, or dietary supplements you use. Also tell them if you smoke, drink alcohol, or use illegal drugs. Some items may interact with your medicine. What should I watch for while using this medicine? Visit your doctor or health care professional for regular checks on your progress. During the first week, your symptoms may get worse, but then will improve as you continue your treatment. You may get hot flashes, increased bone pain, increased difficulty passing urine, or an aggravation of nerve symptoms. Discuss these effects with your doctor or health care professional, some of them may improve with continued use of this medicine. Female patients may experience a menstrual cycle or spotting during the first 2 months of therapy with this medicine. If this continues, contact your doctor or health care professional. This medicine may increase blood sugar. Ask your healthcare provider if changes in diet or medicines are needed if   you have diabetes. What side effects may I notice from receiving this medicine? Side effects that you should report to your doctor or health care professional as soon as possible:  allergic reactions like skin rash, itching or hives, swelling of the face, lips, or tongue  breathing problems  chest  pain  depression or memory disorders  pain in your legs or groin  pain at site where injected  severe headache  signs and symptoms of high blood sugar such as being more thirsty or hungry or having to urinate more than normal. You may also feel very tired or have blurry vision  swelling of the feet and legs  visual changes  vomiting Side effects that usually do not require medical attention (report to your doctor or health care professional if they continue or are bothersome):  breast swelling or tenderness  decrease in sex drive or performance  diarrhea  hot flashes  loss of appetite  muscle, joint, or bone pains  nausea  redness or irritation at site where injected  skin problems or acne This list may not describe all possible side effects. Call your doctor for medical advice about side effects. You may report side effects to FDA at 1-800-FDA-1088. Where should I keep my medicine? Keep out of the reach of children. Store below 25 degrees C (77 degrees F). Do not freeze. Protect from light. Do not use if it is not clear or if there are particles present. Throw away any unused medicine after the expiration date. NOTE: This sheet is a summary. It may not cover all possible information. If you have questions about this medicine, talk to your doctor, pharmacist, or health care provider.  2020 Elsevier/Gold Standard (2018-02-05 09:52:48)  

## 2019-02-10 NOTE — Progress Notes (Signed)
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

## 2019-02-11 ENCOUNTER — Other Ambulatory Visit (HOSPITAL_COMMUNITY): Payer: Self-pay | Admitting: Orthopedic Surgery

## 2019-03-05 MED FILL — NAPROXEN 500 MG TABS: 500 | 30 days supply | Qty: 60 | Fill #0

## 2019-03-05 MED FILL — LIDOCAINE PATCH 5%: 5 | 30 days supply | Qty: 30 | Fill #0

## 2019-03-11 DIAGNOSIS — H25013 Cortical age-related cataract, bilateral: Secondary | ICD-10-CM | POA: Diagnosis not present

## 2019-03-11 DIAGNOSIS — H35372 Puckering of macula, left eye: Secondary | ICD-10-CM | POA: Diagnosis not present

## 2019-03-11 DIAGNOSIS — H43812 Vitreous degeneration, left eye: Secondary | ICD-10-CM | POA: Diagnosis not present

## 2019-03-11 DIAGNOSIS — H2513 Age-related nuclear cataract, bilateral: Secondary | ICD-10-CM | POA: Diagnosis not present

## 2019-03-15 MED FILL — OMEPRAZOLE 20 MG CAP: 20 | 90 days supply | Qty: 90 | Fill #2

## 2019-03-16 ENCOUNTER — Encounter (INDEPENDENT_AMBULATORY_CARE_PROVIDER_SITE_OTHER): Payer: Self-pay | Admitting: Family Medicine

## 2019-03-16 ENCOUNTER — Ambulatory Visit (INDEPENDENT_AMBULATORY_CARE_PROVIDER_SITE_OTHER): Payer: 59 | Admitting: Family Medicine

## 2019-03-16 ENCOUNTER — Other Ambulatory Visit: Payer: Self-pay

## 2019-03-16 VITALS — BP 125/67 | HR 50 | Temp 98.4°F | Ht 66.0 in | Wt 198.0 lb

## 2019-03-16 DIAGNOSIS — E669 Obesity, unspecified: Secondary | ICD-10-CM

## 2019-03-16 DIAGNOSIS — E8881 Metabolic syndrome: Secondary | ICD-10-CM | POA: Diagnosis not present

## 2019-03-16 DIAGNOSIS — Z9189 Other specified personal risk factors, not elsewhere classified: Secondary | ICD-10-CM

## 2019-03-16 DIAGNOSIS — Z6832 Body mass index (BMI) 32.0-32.9, adult: Secondary | ICD-10-CM | POA: Diagnosis not present

## 2019-03-16 DIAGNOSIS — E559 Vitamin D deficiency, unspecified: Secondary | ICD-10-CM

## 2019-03-16 MED ORDER — NALTREXONE-BUPROPION HCL ER 8-90 MG PO TB12: 2.0000 | ORAL_TABLET | Freq: Two times a day (BID) | ORAL | 0 refills | Status: DC

## 2019-03-16 MED ORDER — METFORMIN HCL 1000 MG PO TABS: 1000.0000 mg | ORAL_TABLET | Freq: Two times a day (BID) | ORAL | 0 refills | Status: DC

## 2019-03-16 MED ORDER — VITAMIN D (ERGOCALCIFEROL) 1.25 MG (50000 UNIT) PO CAPS: 50000.0000 [IU] | ORAL_CAPSULE | ORAL | 0 refills | Status: DC

## 2019-03-16 MED FILL — VIT D2 1.25 MG (50,000 UNIT: 1.25 MG | 28 days supply | Qty: 4 | Fill #0

## 2019-03-16 MED FILL — metFORMIN HCL 1000 MG TABS: 1000 | 30 days supply | Qty: 60 | Fill #0

## 2019-03-17 NOTE — Progress Notes (Signed)
Office: [phone]  /  Fax: [phone]   HPI:   Chief Complaint: OBESITY [name] is here to discuss her progress with her obesity treatment plan. She is on the Category 2 plan and is following her eating plan approximately 85 % of the time. She states she is walking 90 minutes 2 times per week. [name] report tending to graze rather than eating full meals. She reports craving sweets.  Her weight is 198 lb (89.8 kg) today and has had a weight gain of 1 pound over a period of 5 weeks since her last visit. She has lost 11 lbs since starting treatment with [name].  Insulin Resistance [name] has a diagnosis of insulin resistance based on her elevated fasting insulin level >5.  She is taking metformin currently and continues to work on diet and exercise to decrease risk of diabetes. [name] admits to cravings and denies polyphagia. Lab Results  Component Value Date   HGBA1C 5.2 11/19/2018    Vitamin D Deficiency [name] has a diagnosis of vitamin D deficiency. She is currently on vit D, but is nearly at goal. Her last vitamin D level was 42.8 on 11/19/18. [name] denies nausea, vomiting, or muscle weakness.  At risk for osteopenia and osteoporosis [name] is at higher risk of osteopenia and osteoporosis due to vitamin D deficiency.   ASSESSMENT AND PLAN:  Insulin resistance - Plan: metFORMIN (GLUCOPHAGE) 1000 MG tablet  Vitamin D deficiency - Plan: Vitamin D, Ergocalciferol, (DRISDOL) 1.25 MG (50000 UT) CAPS capsule  At risk for osteoporosis  Class 1 obesity with serious comorbidity and body mass index (BMI) of 32.0 to 32.9 in adult, unspecified obesity type - Plan: Naltrexone-buPROPion HCl ER 8-90 MG TB12  PLAN:  Insulin Resistance [name] will continue to work on weight loss, exercise, and decreasing simple carbohydrates in her diet to help decrease the risk of diabetes. She was informed that eating too many simple carbohydrates or too many calories at one sitting increases the likelihood of GI  side effects. [name] agreed to continue metformin 100 mg BID #60 with no refills and prescription was written today. We will check her A1c and a fasting insulin at her next visit. [name] agreed to follow up with [name] as directed to monitor her progress.   Vitamin D Deficiency [name] was informed that low vitamin D levels contribute to fatigue and are associated with obesity, breast, and colon cancer. [name] agrees to continue to take prescription Vit D @50 ,000 IU every week #4 with no refills and will follow up for routine testing of vitamin D, at least 2-3 times per year. She was informed of the risk of over-replacement of vitamin D and agrees to not increase her dose unless she discusses this with [name] first. A vitamin D level will be ordered at her next visit. [name] agrees to follow up in 3 weeks as directed.  At risk for osteopenia and osteoporosis [name] was given extended (15 minutes) osteoporosis prevention counseling today. [name] is at risk for osteopenia and osteoporosis due to her vitamin D deficiency. She was encouraged to take her vitamin D and follow her higher calcium diet and increase strengthening exercise to help strengthen her bones and decrease her risk of osteopenia and osteoporosis.  Obesity [name] is currently in the action stage of change. As such, her goal is to continue with weight loss efforts. She has agreed to keep a food journal of 1150 to 1250 calories with 85 grams of protein. [name] has been instructed to continue walking  2 times per week. We discussed the following Behavioral Modification Strategies today: increasing lean protein intake, planning for success, keep a strict food journal, and decreasing simple carbohydrates.  We discussed various medication options to help [name] with her weight loss efforts and we both agreed to begin taking Contrave 8-90 mg, 2 pills BID #120 with no refills. She is to take 1 pill in the morning daily until her next visit. [name] will follow up as  directed.  [name] has agreed to follow up with our clinic in 3 weeks for a fasting appointment. She was informed of the importance of frequent follow up visits to maximize her success with intensive lifestyle modifications for her multiple health conditions.  ALLERGIES: Allergies  Allergen Reactions   Codeine Nausea And Vomiting   Sodium Thiosalicylate     UNSPECIFIED REACTION    Ciprofloxacin Other (See Comments)    FATIGUE   Codone [Hydrocodone Bitartrate] Itching   Daypro [Oxaprozin] Rash   Levofloxacin Other (See Comments)   Stadol [Butorphanol Tartrate] Other (See Comments)    ALTERED MENTAL STATUS   Sulfa Antibiotics Rash    MEDICATIONS: Current Outpatient Medications on File Prior to Visit  Medication Sig Dispense Refill   calcium carbonate (CALCIUM 600) 600 MG TABS tablet Take by mouth.     Diclofenac Sodium (PENNSAID) 2 % SOLN Pennsaid 20 mg/gram/actuation (2 %) topical soln in metered-dose pump  APPLY 2 PUMPS (40 MG) TO THE AFFECTED AREA BY TOPICAL ROUTE 2 TIMES PER DAY     docusate sodium (COLACE) 100 MG capsule Take 200 mg by mouth daily as needed for mild constipation.     escitalopram (LEXAPRO) 20 MG tablet Take 10 mg by mouth daily.      leuprolide (LUPRON DEPOT, 32-MONTH,) 11.25 MG injection Inject into the muscle.     Multiple Vitamin (MULTIVITAMIN) capsule Take 1 capsule by mouth daily.     omeprazole (PRILOSEC) 20 MG capsule Take 20 mg by mouth daily as needed (heartburn).      Probiotic Product (ALIGN PO) Take 1 tablet by mouth daily.      No current facility-administered medications on file prior to visit.     PAST MEDICAL HISTORY: Past Medical History:  Diagnosis Date   Anxiety    Arthritis    Cervical syndrome    CKD (chronic kidney disease), stage III    Constipation    Fatty liver    GERD (gastroesophageal reflux disease)    Granulosa cell tumor of ovary    History of hiatal hernia    Joint pain    Obesity     Osteoarthritis    Ovarian cancer ([place])    PONV (postoperative nausea and vomiting)    history of n/v,  past surgeries no n/v   Sleep apnea     PAST SURGICAL HISTORY: Past Surgical History:  Procedure Laterality Date   ABDOMINAL HYSTERECTOMY     TAH/BSO   APPENDECTOMY     EXPLORATORY LAPAROTOMY     for bowel obstruction   Secondary tumor debulking  2002   SHOULDER SURGERY     2017 left   Tumor debulking  2010   VENTRAL HERNIA REPAIR      SOCIAL HISTORY: Social History   Tobacco Use   Smoking status: Never Smoker   Smokeless tobacco: Never Used  Substance Use Topics   Alcohol use: Yes    Comment: One 8oz glass, socially   Drug use: No    FAMILY HISTORY: Family History  Problem Relation Age of Onset   Basal cell carcinoma Mother    Thyroid disease Mother    Stroke Father    Colon cancer Father    Hypertension Father    Heart disease Father    Colon cancer Paternal Uncle     ROS: Review of Systems  Constitutional: Negative for weight loss.  Gastrointestinal: Negative for nausea and vomiting.  Musculoskeletal:       Negative for muscle weakness.  Endo/Heme/Allergies:       Negative for polyphagia.    PHYSICAL EXAM: Blood pressure 125/67, pulse (!) 50, temperature 98.4 F (36.9 C), temperature source Oral, height 5\' 6"  (1.676 m), weight 198 lb (89.8 kg), SpO2 99 %. Body mass index is 31.96 kg/m. Physical Exam Vitals signs reviewed.  Constitutional:      Appearance: Normal appearance. She is obese.  Cardiovascular:     Rate and Rhythm: Normal rate.  Pulmonary:     Effort: Pulmonary effort is normal.  Musculoskeletal: Normal range of motion.  Skin:    General: Skin is warm and dry.  Neurological:     Mental Status: She is alert and oriented to person, place, and time.  Psychiatric:        Mood and Affect: Mood normal.        Behavior: Behavior normal.     RECENT LABS AND TESTS: BMET    Component Value Date/Time   NA 140  01/14/2019 0855   NA 141 11/19/2018 0945   NA 141 09/24/2013 1131   K 4.5 01/14/2019 0855   K 4.5 09/24/2013 1131   CL 104 01/14/2019 0855   CO2 25 01/14/2019 0855   CO2 24 09/24/2013 1131   GLUCOSE 82 01/14/2019 0855   GLUCOSE 90 09/24/2013 1131   BUN 24 (H) 01/14/2019 0855   BUN 23 11/19/2018 0945   BUN 21.2 09/24/2013 1131   CREATININE 1.03 (H) 01/14/2019 0855   CREATININE 1.18 (H) 09/02/2018 0917   CREATININE 1.1 09/24/2013 1131   CALCIUM 9.1 01/14/2019 0855   CALCIUM 9.5 09/24/2013 1131   GFRNONAA 58 (L) 01/14/2019 0855   GFRNONAA 50 (L) 09/02/2018 0917   GFRAA >60 01/14/2019 0855   GFRAA 58 (L) 09/02/2018 0917   Lab Results  Component Value Date   HGBA1C 5.2 11/19/2018   HGBA1C 5.5 04/29/2018   Lab Results  Component Value Date   INSULIN 17.6 11/19/2018   INSULIN 25.8 (H) 04/29/2018   CBC    Component Value Date/Time   WBC 5.3 04/29/2018 1009   WBC 6.1 10/04/2016 0713   RBC 4.68 04/29/2018 1009   RBC 4.55 10/04/2016 0713   HGB 14.5 04/29/2018 1009   HCT 42.5 04/29/2018 1009   PLT 268 10/04/2016 0713   MCV 91 04/29/2018 1009   MCH 31.0 04/29/2018 1009   MCH 31.6 10/04/2016 0713   MCHC 34.1 04/29/2018 1009   MCHC 35.4 10/04/2016 0713   RDW 12.3 04/29/2018 1009   LYMPHSABS 1.4 04/29/2018 1009   MONOABS 0.5 04/13/2009 1005   EOSABS 0.2 04/29/2018 1009   BASOSABS 0.0 04/29/2018 1009   Iron/TIBC/Ferritin/ %Sat No results found for: IRON, TIBC, FERRITIN, IRONPCTSAT Lipid Panel     Component Value Date/Time   CHOL 224 (H) 11/19/2018 0945   TRIG 200 (H) 11/19/2018 0945   HDL 49 11/19/2018 0945   LDLCALC 135 (H) 11/19/2018 0945   Hepatic Function Panel     Component Value Date/Time   PROT 6.3 11/19/2018 0945   PROT 6.8 09/24/2013  1131   ALBUMIN 4.3 11/19/2018 0945   ALBUMIN 3.9 09/24/2013 1131   AST 19 11/19/2018 0945   AST 23 09/24/2013 1131   ALT 15 11/19/2018 0945   ALT 27 09/24/2013 1131   ALKPHOS 73 11/19/2018 0945   ALKPHOS 81 09/24/2013  1131   BILITOT 0.2 11/19/2018 0945   BILITOT 0.53 09/24/2013 1131      Component Value Date/Time   TSH 0.564 04/29/2018 1009   Results for KILEE, LICHTENSTEIN (MRN [id number]) as of 03/17/2019 08:10  Ref. Range 11/19/2018 09:45  Vitamin D, 25-Hydroxy Latest Ref Range: 30.0 - 100.0 ng/mL 42.8   OBESITY BEHAVIORAL INTERVENTION VISIT  Today's visit was # 17   Starting weight: 209 lbs Starting date: 04/29/2018 Today's weight : Weight: 198 lb (89.8 kg)  Today's date: 03/16/2019 Total lbs lost to date: 11    03/16/2019  Height 5\' 6"  (1.676 m)  Weight 198 lb (89.8 kg)  BMI (Calculated) 31.97  BLOOD PRESSURE - SYSTOLIC 0000000  BLOOD PRESSURE - DIASTOLIC 67   Body Fat % 43 %  Total Body Water (lbs) 74.8 lbs    ASK: We discussed the diagnosis of obesity with [name] today and Rettie agreed to give [name] permission to discuss obesity behavioral modification therapy today.  ASSESS: Simaya has the diagnosis of obesity and her BMI today is 31.97. Crystin is in the action stage of change.   ADVISE: Elmina was educated on the multiple health risks of obesity as well as the benefit of weight loss to improve her health. She was advised of the need for long term treatment and the importance of lifestyle modifications to improve her current health and to decrease her risk of future health problems.  AGREE: Multiple dietary modification options and treatment options were discussed and Jesusa agreed to follow the recommendations documented in the above note.  ARRANGE: Asiyah was educated on the importance of frequent visits to treat obesity as outlined per CMS and USPSTF guidelines and agreed to schedule her next follow up appointment today.  Lenward Chancellor, CMA, am acting as Location manager for [employer], FNP-C.  I have reviewed the above documentation for accuracy and completeness, and I agree with the above.  - Christos Mixson, FNP-C.

## 2019-03-22 ENCOUNTER — Encounter (INDEPENDENT_AMBULATORY_CARE_PROVIDER_SITE_OTHER): Payer: Self-pay | Admitting: Family Medicine

## 2019-03-26 DIAGNOSIS — U071 COVID-19: Secondary | ICD-10-CM

## 2019-03-26 HISTORY — DX: COVID-19: U07.1

## 2019-03-29 ENCOUNTER — Ambulatory Visit (INDEPENDENT_AMBULATORY_CARE_PROVIDER_SITE_OTHER): Payer: 59 | Admitting: Bariatrics

## 2019-04-01 ENCOUNTER — Encounter (INDEPENDENT_AMBULATORY_CARE_PROVIDER_SITE_OTHER): Payer: Self-pay

## 2019-04-02 ENCOUNTER — Encounter (HOSPITAL_BASED_OUTPATIENT_CLINIC_OR_DEPARTMENT_OTHER): Payer: Self-pay | Admitting: Orthopedic Surgery

## 2019-04-05 ENCOUNTER — Encounter (HOSPITAL_BASED_OUTPATIENT_CLINIC_OR_DEPARTMENT_OTHER)
Admission: RE | Admit: 2019-04-05 | Discharge: 2019-04-05 | Disposition: A | Discharge status: Home / Self Care | Payer: 59 | Source: Ambulatory Visit | Attending: Orthopedic Surgery | Admitting: Orthopedic Surgery

## 2019-04-05 ENCOUNTER — Telehealth (INDEPENDENT_AMBULATORY_CARE_PROVIDER_SITE_OTHER): Payer: 59 | Admitting: Family Medicine

## 2019-04-05 ENCOUNTER — Other Ambulatory Visit: Payer: Self-pay

## 2019-04-05 ENCOUNTER — Encounter (INDEPENDENT_AMBULATORY_CARE_PROVIDER_SITE_OTHER): Payer: Self-pay | Admitting: Family Medicine

## 2019-04-05 ENCOUNTER — Other Ambulatory Visit (HOSPITAL_COMMUNITY): Admission: RE | Admit: 2019-04-05 | Payer: 59 | Source: Ambulatory Visit

## 2019-04-05 DIAGNOSIS — E559 Vitamin D deficiency, unspecified: Secondary | ICD-10-CM | POA: Diagnosis not present

## 2019-04-05 DIAGNOSIS — E8881 Metabolic syndrome: Secondary | ICD-10-CM | POA: Diagnosis not present

## 2019-04-05 DIAGNOSIS — Z6831 Body mass index (BMI) 31.0-31.9, adult: Secondary | ICD-10-CM | POA: Diagnosis not present

## 2019-04-05 DIAGNOSIS — E669 Obesity, unspecified: Secondary | ICD-10-CM | POA: Diagnosis not present

## 2019-04-05 DIAGNOSIS — Z01812 Encounter for preprocedural laboratory examination: Secondary | ICD-10-CM | POA: Insufficient documentation

## 2019-04-05 LAB — BASIC METABOLIC PANEL
Anion gap: 9 (ref 5–15)
BUN: 23 mg/dL (ref 8–23)
CO2: 28 mmol/L (ref 22–32)
Calcium: 9.4 mg/dL (ref 8.9–10.3)
Chloride: 102 mmol/L (ref 98–111)
Creatinine, Ser: 1.46 mg/dL — ABNORMAL HIGH (ref 0.44–1.00)
GFR calc Af Amer: 44 mL/min — ABNORMAL LOW (ref >60)
GFR calc non Af Amer: 38 mL/min — ABNORMAL LOW (ref >60)
Glucose, Bld: 93 mg/dL (ref 70–99)
Potassium: 4.6 mmol/L (ref 3.5–5.1)
Sodium: 139 mmol/L (ref 135–145)

## 2019-04-05 MED ORDER — VITAMIN D (ERGOCALCIFEROL) 1.25 MG (50000 UNIT) PO CAPS: 50000.0000 [IU] | ORAL_CAPSULE | ORAL | 0 refills | Status: DC

## 2019-04-05 MED ORDER — METFORMIN HCL 1000 MG PO TABS: 1000.0000 mg | ORAL_TABLET | Freq: Two times a day (BID) | ORAL | 0 refills | Status: DC

## 2019-04-05 NOTE — Progress Notes (Signed)

## 2019-04-06 NOTE — Progress Notes (Signed)
Office: [phone]  /  Fax: [phone] TeleHealth Visit:  [name] has verbally consented to this TeleHealth visit today. The patient is located in a parking lot, the provider is located at the [employer] [facility] office. The participants in this visit include the listed provider and patient and any and all parties involved. The visit was conducted today via FaceTime.  HPI:  Chief Complaint: OBESITY [name] is here to discuss her progress with her obesity treatment plan. She is on the Category 2 plan and states she is following her eating plan approximately 50 % of the time. She states she is exercising 0 minutes 0 times per week.  [name] reports losing 5 pounds since the last office visit (weight not reported). She was diagnosed with COVID on 03/26/19. She has not had a sense of taste which has reduced her appetite. [name] has not started Contrave which was prescribed at last visit.  Vitamin D deficiency [name] has a diagnosis of vitamin D deficiency. Her last vitamin D level was at 42.8 on 11/19/18 and was not at goal.   Insulin Resistance [name] has a diagnosis of insulin resistance and she is on Metformin twice daily. She denies polyphagia.   ASSESSMENT AND PLAN:  Vitamin D deficiency - Plan: Vitamin D, Ergocalciferol, (DRISDOL) 1.25 MG (50000 UT) CAPS capsule  Insulin resistance - Plan: metFORMIN (GLUCOPHAGE) 1000 MG tablet  Class 1 obesity with serious comorbidity and body mass index (BMI) of 31.0 to 31.9 in adult, unspecified obesity type  PLAN:  Vitamin D Deficiency Low vitamin D level contributes to fatigue and are associated with obesity, breast, and colon cancer. [name] agrees to continue to take prescription Vit D @50 ,000 IU every week #4 with no refills and she will follow up for routine testing of vitamin D, at least 2-3 times per year to avoid over-replacement. Lashanta agrees to follow up as directed.  Insulin Resistance Mildred will continue to work on weight loss,  exercise, and decreasing simple carbohydrates to help decrease the risk of diabetes. [name] agreed to continue metformin 500 mg two times daily with meals #60 with no refills and follow up with [name] as directed to closely monitor her progress.  Obesity [name] is currently in the action stage of change. As such, her goal is to continue with weight loss efforts She has agreed to keep a food journal with 1150 to 1250 calories and 85 grams of protein daily [name] has been instructed to work up to a goal of 150 minutes of combined cardio and strengthening exercise per week for weight loss and overall health benefits. We discussed the following Behavioral Modification Strategies today: planning for success, keep a strict food journal and increasing lean protein intake  [name] will start Contrave when her sense of taste returns.  [name] has agreed to follow up with our clinic in 3 weeks. She was informed of the importance of frequent follow up visits to maximize her success with intensive lifestyle modifications for her multiple health conditions.  ALLERGIES: Allergies  Allergen Reactions  . Codeine Nausea And Vomiting  . Oxycodone Itching  . Sodium Thiosalicylate     UNSPECIFIED REACTION   . Ciprofloxacin Other (See Comments)    FATIGUE  . Codone [Hydrocodone Bitartrate] Itching  . Daypro [Oxaprozin] Rash  . Levofloxacin Other (See Comments)  . Stadol [Butorphanol Tartrate] Other (See Comments)    ALTERED MENTAL STATUS  . Sulfa Antibiotics Rash    MEDICATIONS: Current Outpatient Medications on File Prior to  Visit  Medication Sig Dispense Refill  . calcium carbonate (CALCIUM 600) 600 MG TABS tablet Take by mouth.    . Diclofenac Sodium (PENNSAID) 2 % SOLN [place]aid 20 mg/gram/actuation (2 %) topical soln in metered-dose pump  APPLY 2 PUMPS (40 MG) TO THE AFFECTED AREA BY TOPICAL ROUTE 2 TIMES PER DAY    . docusate sodium (COLACE) 100 MG capsule Take 200 mg by mouth daily as needed for mild  constipation.    Marland Kitchen escitalopram (LEXAPRO) 20 MG tablet Take 10 mg by mouth daily.     Marland Kitchen leuprolide (LUPRON DEPOT, 38-MONTH,) 11.25 MG injection Inject into the muscle.    . Multiple Vitamin (MULTIVITAMIN) capsule Take 1 capsule by mouth daily.    . Naltrexone-buPROPion HCl ER 8-90 MG TB12 Take 2 tablets by mouth 2 (two) times daily. 120 tablet 0  . naproxen (NAPROSYN) 500 MG tablet Take 500 mg by mouth 2 (two) times daily with a meal.    . omeprazole (PRILOSEC) 20 MG capsule Take 20 mg by mouth daily as needed (heartburn).     . Probiotic Product (ALIGN PO) Take 1 tablet by mouth daily.      No current facility-administered medications on file prior to visit.    PAST MEDICAL HISTORY: Past Medical History:  Diagnosis Date  . Anxiety   . Arthritis   . Cervical syndrome   . CKD (chronic kidney disease), stage III   . Constipation   . Fatty liver   . GERD (gastroesophageal reflux disease)   . Granulosa cell tumor of ovary   . History of hiatal hernia   . Joint pain   . Obesity   . Osteoarthritis   . Ovarian cancer ([place])   . PONV (postoperative nausea and vomiting)    history of n/v,  past surgeries no n/v  . Sleep apnea     PAST SURGICAL HISTORY: Past Surgical History:  Procedure Laterality Date  . ABDOMINAL HYSTERECTOMY     TAH/BSO  . APPENDECTOMY    . EXPLORATORY LAPAROTOMY     for bowel obstruction  . Secondary tumor debulking  2002  . SHOULDER SURGERY     2017 left  . Tumor debulking  2010  . VENTRAL HERNIA REPAIR      SOCIAL HISTORY: Social History   Tobacco Use  . Smoking status: Never Smoker  . Smokeless tobacco: Never Used  Substance Use Topics  . Alcohol use: Yes    Comment: One 8oz glass, socially  . Drug use: No    FAMILY HISTORY: Family History  Problem Relation Age of Onset  . Basal cell carcinoma Mother   . Thyroid disease Mother   . Stroke Father   . Colon cancer Father   . Hypertension Father   . Heart disease Father   . Colon cancer  Paternal Uncle     ROS: Review of Systems  Constitutional: Positive for weight loss.  Endo/Heme/Allergies:       Negative for polyphagia    PHYSICAL EXAM: There were no vitals taken for this visit. There is no height or weight on file to calculate BMI. Physical Exam Vitals reviewed.  Constitutional:      General: She is not in acute distress.    Appearance: Normal appearance. She is well-developed. She is obese.  Cardiovascular:     Rate and Rhythm: Normal rate.  Pulmonary:     Effort: Pulmonary effort is normal.  Musculoskeletal:        General: Normal range of  motion.  Skin:    General: Skin is warm and dry.  Neurological:     Mental Status: She is alert and oriented to person, place, and time.  Psychiatric:        Mood and Affect: Mood normal.        Behavior: Behavior normal.     RECENT LABS AND TESTS: BMET    Component Value Date/Time   NA 139 04/05/2019 1200   NA 141 11/19/2018 0945   NA 141 09/24/2013 1131   K 4.6 04/05/2019 1200   K 4.5 09/24/2013 1131   CL 102 04/05/2019 1200   CO2 28 04/05/2019 1200   CO2 24 09/24/2013 1131   GLUCOSE 93 04/05/2019 1200   GLUCOSE 90 09/24/2013 1131   BUN 23 04/05/2019 1200   BUN 23 11/19/2018 0945   BUN 21.2 09/24/2013 1131   CREATININE 1.46 (H) 04/05/2019 1200   CREATININE 1.18 (H) 09/02/2018 0917   CREATININE 1.1 09/24/2013 1131   CALCIUM 9.4 04/05/2019 1200   CALCIUM 9.5 09/24/2013 1131   GFRNONAA 38 (L) 04/05/2019 1200   GFRNONAA 50 (L) 09/02/2018 0917   GFRAA 44 (L) 04/05/2019 1200   GFRAA 58 (L) 09/02/2018 0917   Lab Results  Component Value Date   HGBA1C 5.2 11/19/2018   HGBA1C 5.5 04/29/2018   Lab Results  Component Value Date   INSULIN 17.6 11/19/2018   INSULIN 25.8 (H) 04/29/2018   CBC    Component Value Date/Time   WBC 5.3 04/29/2018 1009   WBC 6.1 10/04/2016 0713   RBC 4.68 04/29/2018 1009   RBC 4.55 10/04/2016 0713   HGB 14.5 04/29/2018 1009   HCT 42.5 04/29/2018 1009   PLT 268  10/04/2016 0713   MCV 91 04/29/2018 1009   MCH 31.0 04/29/2018 1009   MCH 31.6 10/04/2016 0713   MCHC 34.1 04/29/2018 1009   MCHC 35.4 10/04/2016 0713   RDW 12.3 04/29/2018 1009   LYMPHSABS 1.4 04/29/2018 1009   MONOABS 0.5 04/13/2009 1005   EOSABS 0.2 04/29/2018 1009   BASOSABS 0.0 04/29/2018 1009   Iron/TIBC/Ferritin/ %Sat No results found for: IRON, TIBC, FERRITIN, IRONPCTSAT Lipid Panel     Component Value Date/Time   CHOL 224 (H) 11/19/2018 0945   TRIG 200 (H) 11/19/2018 0945   HDL 49 11/19/2018 0945   LDLCALC 135 (H) 11/19/2018 0945   Hepatic Function Panel     Component Value Date/Time   PROT 6.3 11/19/2018 0945   PROT 6.8 09/24/2013 1131   ALBUMIN 4.3 11/19/2018 0945   ALBUMIN 3.9 09/24/2013 1131   AST 19 11/19/2018 0945   AST 23 09/24/2013 1131   ALT 15 11/19/2018 0945   ALT 27 09/24/2013 1131   ALKPHOS 73 11/19/2018 0945   ALKPHOS 81 09/24/2013 1131   BILITOT 0.2 11/19/2018 0945   BILITOT 0.53 09/24/2013 1131      Component Value Date/Time   TSH 0.564 04/29/2018 1009     Ref. Range 11/19/2018 09:45  Vitamin D, 25-Hydroxy Latest Ref Range: 30.0 - 100.0 ng/mL 42.8    I, Doreene Nest, am acting as Location manager for [employer], FNP-C  I have reviewed the above documentation for accuracy and completeness, and I agree with the above.  - Talaysia Pinheiro, FNP-C.

## 2019-04-07 MED FILL — VIT D2 1.25 MG (50,000 UNIT: 1.25 MG | 28 days supply | Qty: 4 | Fill #0

## 2019-04-09 MED FILL — metFORMIN HCL 1000 MG TABS: 1000 | 30 days supply | Qty: 60 | Fill #0

## 2019-04-13 ENCOUNTER — Other Ambulatory Visit: Payer: Self-pay

## 2019-04-13 ENCOUNTER — Encounter (HOSPITAL_BASED_OUTPATIENT_CLINIC_OR_DEPARTMENT_OTHER): Payer: Self-pay | Admitting: Orthopedic Surgery

## 2019-04-19 ENCOUNTER — Other Ambulatory Visit (HOSPITAL_COMMUNITY): Admission: RE | Admit: 2019-04-19 | Payer: 59 | Source: Ambulatory Visit

## 2019-04-19 MED FILL — ESCITALOPRAM 20 MG TABLET: 20 | 90 days supply | Qty: 90 | Fill #3

## 2019-04-22 ENCOUNTER — Other Ambulatory Visit: Payer: Self-pay

## 2019-04-22 ENCOUNTER — Ambulatory Visit (HOSPITAL_BASED_OUTPATIENT_CLINIC_OR_DEPARTMENT_OTHER)
Admission: RE | Admit: 2019-04-22 | Discharge: 2019-04-22 | Disposition: A | Discharge status: Home / Self Care | Payer: 59 | Attending: Orthopedic Surgery | Admitting: Orthopedic Surgery

## 2019-04-22 ENCOUNTER — Encounter (HOSPITAL_BASED_OUTPATIENT_CLINIC_OR_DEPARTMENT_OTHER): Payer: Self-pay | Admitting: Orthopedic Surgery

## 2019-04-22 ENCOUNTER — Ambulatory Visit (HOSPITAL_BASED_OUTPATIENT_CLINIC_OR_DEPARTMENT_OTHER): Payer: 59 | Admitting: Anesthesiology

## 2019-04-22 ENCOUNTER — Encounter (HOSPITAL_BASED_OUTPATIENT_CLINIC_OR_DEPARTMENT_OTHER)
Admission: RE | Disposition: A | Discharge status: Home / Self Care | Payer: Self-pay | Source: Home / Self Care | Attending: Orthopedic Surgery

## 2019-04-22 DIAGNOSIS — F419 Anxiety disorder, unspecified: Secondary | ICD-10-CM | POA: Diagnosis not present

## 2019-04-22 DIAGNOSIS — Z6832 Body mass index (BMI) 32.0-32.9, adult: Secondary | ICD-10-CM | POA: Diagnosis not present

## 2019-04-22 DIAGNOSIS — M1990 Unspecified osteoarthritis, unspecified site: Secondary | ICD-10-CM | POA: Insufficient documentation

## 2019-04-22 DIAGNOSIS — Z8543 Personal history of malignant neoplasm of ovary: Secondary | ICD-10-CM | POA: Diagnosis not present

## 2019-04-22 DIAGNOSIS — M2022 Hallux rigidus, left foot: Secondary | ICD-10-CM | POA: Insufficient documentation

## 2019-04-22 DIAGNOSIS — Z8619 Personal history of other infectious and parasitic diseases: Secondary | ICD-10-CM | POA: Insufficient documentation

## 2019-04-22 DIAGNOSIS — K219 Gastro-esophageal reflux disease without esophagitis: Secondary | ICD-10-CM | POA: Insufficient documentation

## 2019-04-22 DIAGNOSIS — G4730 Sleep apnea, unspecified: Secondary | ICD-10-CM | POA: Diagnosis not present

## 2019-04-22 DIAGNOSIS — N183 Chronic kidney disease, stage 3 unspecified: Secondary | ICD-10-CM | POA: Insufficient documentation

## 2019-04-22 DIAGNOSIS — K760 Fatty (change of) liver, not elsewhere classified: Secondary | ICD-10-CM | POA: Diagnosis not present

## 2019-04-22 DIAGNOSIS — Z791 Long term (current) use of non-steroidal anti-inflammatories (NSAID): Secondary | ICD-10-CM | POA: Diagnosis not present

## 2019-04-22 DIAGNOSIS — Z7984 Long term (current) use of oral hypoglycemic drugs: Secondary | ICD-10-CM | POA: Diagnosis not present

## 2019-04-22 DIAGNOSIS — Z79899 Other long term (current) drug therapy: Secondary | ICD-10-CM | POA: Insufficient documentation

## 2019-04-22 LAB — GLUCOSE, CAPILLARY: Glucose-Capillary: 83 mg/dL (ref 70–99)

## 2019-04-22 SURGERY — CHEILECTOMY, GREAT TOE, WITH IMPLANT INSERTION
Anesthesia: Monitor Anesthesia Care | Site: Foot | Laterality: Left

## 2019-04-22 MED ORDER — FENTANYL CITRATE (PF) 100 MCG/2ML IJ SOLN
50.0000 ug | INTRAMUSCULAR | Status: DC | PRN
  Administered 2019-04-22: 50 ug via INTRAVENOUS
  Administered 2019-04-22: 100 ug via INTRAVENOUS

## 2019-04-22 MED ORDER — HYDROCODONE-ACETAMINOPHEN 5-325 MG PO TABS: 1.0000 | ORAL_TABLET | Freq: Four times a day (QID) | ORAL | 0 refills | Status: AC | PRN

## 2019-04-22 MED ORDER — CHLORHEXIDINE GLUCONATE 4 % EX LIQD: 60.0000 mL | Freq: Once | CUTANEOUS | Status: DC

## 2019-04-22 MED ORDER — FENTANYL CITRATE (PF) 100 MCG/2ML IJ SOLN: 25.0000 ug | INTRAMUSCULAR | Status: DC | PRN

## 2019-04-22 MED ORDER — PROPOFOL 500 MG/50ML IV EMUL
INTRAVENOUS | Status: AC
  Filled 2019-04-22: qty 50

## 2019-04-22 MED ORDER — ONDANSETRON HCL 4 MG/2ML IJ SOLN
4.0000 mg | Freq: Once | INTRAMUSCULAR | Status: AC | PRN
  Administered 2019-04-22: 4 mg via INTRAVENOUS

## 2019-04-22 MED ORDER — MIDAZOLAM HCL 2 MG/2ML IJ SOLN
1.0000 mg | INTRAMUSCULAR | Status: DC | PRN
  Administered 2019-04-22: 2 mg via INTRAVENOUS
  Administered 2019-04-22: 1 mg via INTRAVENOUS

## 2019-04-22 MED ORDER — CEFAZOLIN SODIUM-DEXTROSE 2-4 GM/100ML-% IV SOLN: 2.0000 g | INTRAVENOUS | Status: DC

## 2019-04-22 MED ORDER — MIDAZOLAM HCL 2 MG/2ML IJ SOLN
INTRAMUSCULAR | Status: AC
  Filled 2019-04-22: qty 2

## 2019-04-22 MED ORDER — SODIUM CHLORIDE 0.9 % IV SOLN: INTRAVENOUS | Status: DC

## 2019-04-22 MED ORDER — LIDOCAINE 2% (20 MG/ML) 5 ML SYRINGE
INTRAMUSCULAR | Status: AC
  Filled 2019-04-22: qty 5

## 2019-04-22 MED ORDER — EPHEDRINE 5 MG/ML INJ
INTRAVENOUS | Status: AC
  Filled 2019-04-22: qty 10

## 2019-04-22 MED ORDER — ACETAMINOPHEN 325 MG PO TABS: 325.0000 mg | ORAL_TABLET | ORAL | Status: DC | PRN

## 2019-04-22 MED ORDER — ACETAMINOPHEN 160 MG/5ML PO SOLN: 325.0000 mg | ORAL | Status: DC | PRN

## 2019-04-22 MED ORDER — FENTANYL CITRATE (PF) 100 MCG/2ML IJ SOLN
INTRAMUSCULAR | Status: AC
  Filled 2019-04-22: qty 2

## 2019-04-22 MED ORDER — CEFAZOLIN SODIUM-DEXTROSE 2-4 GM/100ML-% IV SOLN
INTRAVENOUS | Status: AC
  Filled 2019-04-22: qty 100

## 2019-04-22 MED ORDER — SUCCINYLCHOLINE CHLORIDE 200 MG/10ML IV SOSY
PREFILLED_SYRINGE | INTRAVENOUS | Status: AC
  Filled 2019-04-22: qty 10

## 2019-04-22 MED ORDER — MEPERIDINE HCL 25 MG/ML IJ SOLN: 6.2500 mg | INTRAMUSCULAR | Status: DC | PRN

## 2019-04-22 MED ORDER — OXYCODONE HCL 5 MG/5ML PO SOLN: 5.0000 mg | Freq: Once | ORAL | Status: DC | PRN

## 2019-04-22 MED ORDER — ONDANSETRON HCL 4 MG/2ML IJ SOLN
INTRAMUSCULAR | Status: AC
  Filled 2019-04-22: qty 2

## 2019-04-22 MED ORDER — LACTATED RINGERS IV SOLN: INTRAVENOUS | Status: DC

## 2019-04-22 MED ORDER — PHENYLEPHRINE 40 MCG/ML (10ML) SYRINGE FOR IV PUSH (FOR BLOOD PRESSURE SUPPORT)
PREFILLED_SYRINGE | INTRAVENOUS | Status: AC
  Filled 2019-04-22: qty 10

## 2019-04-22 MED ORDER — PROPOFOL 500 MG/50ML IV EMUL
INTRAVENOUS | Status: DC | PRN
  Administered 2019-04-22: 75 ug/kg/min via INTRAVENOUS

## 2019-04-22 MED ORDER — BUPIVACAINE LIPOSOME 1.3 % IJ SUSP
INTRAMUSCULAR | Status: DC | PRN
  Administered 2019-04-22 (×2): 5 mL

## 2019-04-22 MED ORDER — BUPIVACAINE HCL 0.5 % IJ SOLN
INTRAMUSCULAR | Status: DC | PRN
  Administered 2019-04-22 (×2): 15 mL

## 2019-04-22 MED ORDER — OXYCODONE HCL 5 MG PO TABS: 5.0000 mg | ORAL_TABLET | Freq: Once | ORAL | Status: DC | PRN

## 2019-04-22 MED FILL — HYDROCODON-APAP 5-325: 5-325 | 3 days supply | Qty: 12 | Fill #0

## 2019-04-22 SURGICAL SUPPLY — 59 items
APL PRP STRL LF DISP 70% ISPRP (MISCELLANEOUS) ×1
BANDAGE ESMARK 6X9 LF (GAUZE/BANDAGES/DRESSINGS) IMPLANT
BLADE SURG 15 STRL LF DISP TIS (BLADE) ×2 IMPLANT
BLADE SURG 15 STRL SS (BLADE) ×4
BNDG CMPR 9X4 STRL LF SNTH (GAUZE/BANDAGES/DRESSINGS)
BNDG CMPR 9X6 STRL LF SNTH (GAUZE/BANDAGES/DRESSINGS)
BNDG COHESIVE 4X5 TAN STRL (GAUZE/BANDAGES/DRESSINGS) ×2 IMPLANT
BNDG CONFORM 2 STRL LF (GAUZE/BANDAGES/DRESSINGS) IMPLANT
BNDG CONFORM 3 STRL LF (GAUZE/BANDAGES/DRESSINGS) ×2 IMPLANT
BNDG ESMARK 4X9 LF (GAUZE/BANDAGES/DRESSINGS) IMPLANT
BNDG ESMARK 6X9 LF (GAUZE/BANDAGES/DRESSINGS)
CHLORAPREP W/TINT 26 (MISCELLANEOUS) ×2 IMPLANT
COVER BACK TABLE REUSABLE LG (DRAPES) ×2 IMPLANT
COVER WAND RF STERILE (DRAPES) IMPLANT
CUFF TOURN SGL QUICK 34 (TOURNIQUET CUFF)
CUFF TRNQT CYL 34X4.125X (TOURNIQUET CUFF) IMPLANT
DRAPE EXTREMITY T 121X128X90 (DISPOSABLE) ×2 IMPLANT
DRAPE HALF SHEET 70X43 (DRAPES) ×2 IMPLANT
DRAPE OEC MINIVIEW 54X84 (DRAPES) IMPLANT
DRAPE SURG 17X23 STRL (DRAPES) IMPLANT
DRAPE U-SHAPE 47X51 STRL (DRAPES) ×2 IMPLANT
DRSG MEPITEL 4X7.2 (GAUZE/BANDAGES/DRESSINGS) ×2 IMPLANT
DRSG PAD ABDOMINAL 8X10 ST (GAUZE/BANDAGES/DRESSINGS) ×2 IMPLANT
ELECT REM PT RETURN 9FT ADLT (ELECTROSURGICAL) ×2
ELECTRODE REM PT RTRN 9FT ADLT (ELECTROSURGICAL) ×1 IMPLANT
GAUZE SPONGE 4X4 12PLY STRL (GAUZE/BANDAGES/DRESSINGS) ×2 IMPLANT
GLOVE BIO SURGEON STRL SZ8 (GLOVE) ×2 IMPLANT
GLOVE BIOGEL PI IND STRL 8 (GLOVE) ×2 IMPLANT
GLOVE BIOGEL PI INDICATOR 8 (GLOVE) ×2
GLOVE ECLIPSE 8.0 STRL XLNG CF (GLOVE) ×2 IMPLANT
GOWN STRL REUS W/ TWL LRG LVL3 (GOWN DISPOSABLE) ×1 IMPLANT
GOWN STRL REUS W/ TWL XL LVL3 (GOWN DISPOSABLE) ×2 IMPLANT
GOWN STRL REUS W/TWL LRG LVL3 (GOWN DISPOSABLE) ×2
GOWN STRL REUS W/TWL XL LVL3 (GOWN DISPOSABLE) ×4
IMPL MTP CARTIVA 10MM (Orthopedic Implant) IMPLANT
IMPLANT MTP CARTIVA 10MM (Orthopedic Implant) ×2 IMPLANT
NDL HYPO 25X1 1.5 SAFETY (NEEDLE) IMPLANT
NEEDLE HYPO 25X1 1.5 SAFETY (NEEDLE) IMPLANT
NS IRRIG 1000ML POUR BTL (IV SOLUTION) ×2 IMPLANT
PACK BASIN DAY SURGERY FS (CUSTOM PROCEDURE TRAY) ×2 IMPLANT
PAD CAST 4YDX4 CTTN HI CHSV (CAST SUPPLIES) ×1 IMPLANT
PADDING CAST COTTON 4X4 STRL (CAST SUPPLIES) ×2
PENCIL SMOKE EVACUATOR (MISCELLANEOUS) ×2 IMPLANT
SANITIZER HAND PURELL 535ML FO (MISCELLANEOUS) ×2 IMPLANT
SLEEVE SCD COMPRESS KNEE MED (MISCELLANEOUS) ×2 IMPLANT
SPONGE LAP 18X18 RF (DISPOSABLE) ×2 IMPLANT
STOCKINETTE 6  STRL (DRAPES) ×1
STOCKINETTE 6 STRL (DRAPES) ×1 IMPLANT
SUCTION FRAZIER HANDLE 10FR (MISCELLANEOUS) ×1
SUCTION TUBE FRAZIER 10FR DISP (MISCELLANEOUS) ×1 IMPLANT
SUT ETHILON 3 0 PS 1 (SUTURE) ×2 IMPLANT
SUT MNCRL AB 3-0 PS2 18 (SUTURE) ×2 IMPLANT
SUT VIC AB 2-0 SH 27 (SUTURE) ×2
SUT VIC AB 2-0 SH 27XBRD (SUTURE) ×1 IMPLANT
SYR BULB 3OZ (MISCELLANEOUS) ×2 IMPLANT
SYR CONTROL 10ML LL (SYRINGE) IMPLANT
TOWEL GREEN STERILE FF (TOWEL DISPOSABLE) ×2 IMPLANT
TUBE CONNECTING 20X1/4 (TUBING) ×2 IMPLANT
UNDERPAD 30X36 HEAVY ABSORB (UNDERPADS AND DIAPERS) ×2 IMPLANT

## 2019-04-22 NOTE — H&P (Signed)
[name] is an 63 y.o. female.   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  She has failed nonoperative treatment to date including activity modification, oral anti-inflammatories, shoewear modification and orthotics.  She presents now for surgical treatment of this painful left forefoot condition.  Past Medical History:  Diagnosis Date  . Anxiety   . Arthritis   . Cervical syndrome   . CKD (chronic kidney disease), stage III   . Constipation   . COVID-19 03/26/2019  . Fatty liver   . GERD (gastroesophageal reflux disease)   . Granulosa cell tumor of ovary   . History of hiatal hernia   . Joint pain   . Obesity   . Osteoarthritis   . Ovarian cancer ([place])   . PONV (postoperative nausea and vomiting)    history of n/v,  past surgeries no n/v  . Sleep apnea     Past Surgical History:  Procedure Laterality Date  . ABDOMINAL HYSTERECTOMY     TAH/BSO  . APPENDECTOMY    . EXPLORATORY LAPAROTOMY     for bowel obstruction  . Secondary tumor debulking  2002  . SHOULDER SURGERY     2017 left  . Tumor debulking  2010  . VENTRAL HERNIA REPAIR      Family History  Problem Relation Age of Onset  . Basal cell carcinoma Mother   . Thyroid disease Mother   . Stroke Father   . Colon cancer Father   . Hypertension Father   . Heart disease Father   . Colon cancer Paternal Uncle    Social History:  reports that she has never smoked. She has never used smokeless tobacco. She reports current alcohol use. She reports that she does not use drugs.  Allergies:  Allergies  Allergen Reactions  . Codeine Nausea And Vomiting  . Oxycodone Itching  . Sodium Thiosalicylate     UNSPECIFIED REACTION   . Ciprofloxacin Other (See Comments)    FATIGUE  . Codone [Hydrocodone Bitartrate] Itching  . Daypro [Oxaprozin] Rash  . Levofloxacin Other (See Comments)  . Stadol [Butorphanol Tartrate] Other  (See Comments)    ALTERED MENTAL STATUS  . Sulfa Antibiotics Rash    Medications Prior to Admission  Medication Sig Dispense Refill  . Biotin 10 MG CAPS Take by mouth.    . calcium carbonate (CALCIUM 600) 600 MG TABS tablet Take by mouth.    . escitalopram (LEXAPRO) 20 MG tablet Take 10 mg by mouth daily.     . metFORMIN (GLUCOPHAGE) 1000 MG tablet Take 1 tablet (1,000 mg total) by mouth 2 (two) times daily with a meal. 60 tablet 0  . Multiple Vitamin (MULTIVITAMIN) capsule Take 1 capsule by mouth daily.    . naproxen (NAPROSYN) 500 MG tablet Take 500 mg by mouth 2 (two) times daily with a meal.    . omeprazole (PRILOSEC) 20 MG capsule Take 20 mg by mouth daily as needed (heartburn).     . Probiotic Product (ALIGN PO) Take 1 tablet by mouth daily.     . Vitamin D, Ergocalciferol, (DRISDOL) 1.25 MG (50000 UT) CAPS capsule Take 1 capsule (50,000 Units total) by mouth every 7 (seven) days. 4 capsule 0  . leuprolide (LUPRON DEPOT, 26-MONTH,) 11.25 MG injection Inject into the muscle.    . Naltrexone-buPROPion HCl ER 8-90 MG TB12 Take 2 tablets by mouth 2 (two) times  daily. 120 tablet 0    Results for orders placed or performed during the hospital encounter of 2019-04-29 (from the past 48 hour(s))  Glucose, capillary     Status: None   Collection Time: Apr 29, 2019 11:54 AM  Result Value Ref Range   Glucose-Capillary 83 70 - 99 mg/dL   No results found.  Review of Systems no recent fever, chills, nausea, vomiting or changes in her appetite  Blood pressure 113/69, pulse (!) 46, temperature (!) 97 F (36.1 C), temperature source Oral, resp. rate 16, height 5\' 6"  (1.676 m), weight 90 kg, SpO2 100 %. Physical Exam  Well-nourished well-developed woman in no apparent distress.  Alert and oriented x4.  Mood and affect are normal.  Extraocular motions are intact.  Respirations are unlabored.  Gait is normal.  The left foot has diminished range of motion at the hallux MP joint.  Skin is healthy and  intact.  Pulses are palpable.  No lymphadenopathy.  5 out of 5 strength in plantarflexion and dorsiflexion of the ankle and toes.  Assessment/Plan  Left hallux rigidus -to the operating room today for hallux MP joint cheilectomy and joint resurfacing.  The risks and benefits of the alternative treatment options have been discussed in detail.  The patient wishes to proceed with surgery and specifically understands risks of bleeding, infection, nerve damage, blood clots, need for additional surgery, amputation and death.   Wylene Simmer, MD April 29, 2019, 12:39 PM

## 2019-04-22 NOTE — Progress Notes (Signed)
Assisted Dr. Oddono with left, ultrasound guided, popliteal, adductor canal block. Side rails up, monitors on throughout procedure. See vital signs in flow sheet. Tolerated Procedure well. 

## 2019-04-22 NOTE — Transfer of Care (Signed)
Immediate Anesthesia Transfer of Care Note  Patient: [name]  Procedure(s) Performed: Left hallux metatarsal phalangeal joint cheilectomy and joint resurfacing (Left Foot)  Patient Location: PACU  Anesthesia Type:MAC and MAC combined with regional for post-op pain  Level of Consciousness: awake and drowsy  Airway & Oxygen Therapy: Patient Spontanous Breathing and Patient connected to face mask oxygen  Post-op Assessment: Report given to RN and Post -op Vital signs reviewed and stable  Post vital signs: Reviewed and stable  Last Vitals:  Vitals Value Taken Time  BP    Temp    Pulse 56 04/22/19 1326  Resp    SpO2 96 % 04/22/19 1326  Vitals shown include unvalidated device data.  Last Pain:  Vitals:   04/22/19 1217  TempSrc:   PainSc: 0-No pain      Patients Stated Pain Goal: 3 (35/67/01 4103)  Complications: No apparent anesthesia complications

## 2019-04-22 NOTE — Anesthesia Procedure Notes (Signed)
Anesthesia Regional Block: Popliteal block   Pre-Anesthetic Checklist: ,, timeout performed, Correct Patient, Correct Site, Correct Laterality, Correct Procedure, Correct Position, site marked, Risks and benefits discussed,  Surgical consent,  Pre-op evaluation,  At surgeon's request and post-op pain management  Laterality: Left  Prep: chloraprep       Needles:  Injection technique: Single-shot  Needle Type: Echogenic Stimulator Needle     Needle Length: 5cm  Needle Gauge: 22     Additional Needles:   Procedures:, nerve stimulator,,, ultrasound used (permanent image in chart),,,,  Narrative:  Start time: 04/22/2019 12:15 PM End time: 04/22/2019 12:25 PM Injection made incrementally with aspirations every 5 mL.  Performed by: Personally  Anesthesiologist: Janeece Riggers, MD  Additional Notes: Functioning IV was confirmed and monitors were applied.  A 38mm 22ga Arrow echogenic stimulator needle was used. Sterile prep and drape,hand hygiene and sterile gloves were used. Ultrasound guidance: relevant anatomy identified, needle position confirmed, local anesthetic spread visualized around nerve(s)., vascular puncture avoided.  Image printed for medical record. Negative aspiration and negative test dose prior to incremental administration of local anesthetic. The patient tolerated the procedure well.

## 2019-04-22 NOTE — Discharge Instructions (Addendum)
Jacqueline Simmer, MD [facility]  Please read the following information regarding your care after surgery.  Medications  You only need a prescription for the narcotic pain medicine (ex. oxycodone, Percocet, Norco).  All of the other medicines listed below are available over the counter. X Resume taking naprosyn 500 mg twice a day X Hydrocodone as prescribed for severe pain  Narcotic pain medicine (ex. oxycodone, Percocet, Vicodin) will cause constipation.  To prevent this problem, take the following medicines while you are taking any pain medicine. X docusate sodium (Colace) 100 mg twice a day X senna (Senokot) 2 tablets twice a day  Weight Bearing ? Bear weight when you are able on your operated leg or foot. X Bear weight only on your operated foot in the post-op shoe. ? Do not bear any weight on the operated leg or foot.  Cast / Splint / Dressing X Keep your splint, cast or dressing clean and dry.  Don't put anything (coat hanger, pencil, etc) down inside of it.  If it gets damp, use a hair dryer on the cool setting to dry it.  If it gets soaked, call the office to schedule an appointment for a cast change. ? Remove your dressing 3 days after surgery and cover the incisions with dry dressings.    After your dressing, cast or splint is removed; you may shower, but do not soak or scrub the wound.  Allow the water to run over it, and then gently pat it dry.  Swelling It is normal for you to have swelling where you had surgery.  To reduce swelling and pain, keep your toes above your nose for at least 3 days after surgery.  It may be necessary to keep your foot or leg elevated for several weeks.  If it hurts, it should be elevated.  Follow Up Call my office at [phone] when you are discharged from the hospital or surgery center to schedule an appointment to be seen two weeks after surgery.  Call my office at [phone] if you develop a fever >101.5 F, nausea, vomiting, bleeding from the  surgical site or severe pain.     Post Anesthesia Home Care Instructions  Activity: Get plenty of rest for the remainder of the day. A responsible individual must stay with you for 24 hours following the procedure.  For the next 24 hours, DO NOT: -Drive a car -Paediatric nurse -Drink alcoholic beverages -Take any medication unless instructed by your physician -Make any legal decisions or sign important papers.  Meals: Start with liquid foods such as gelatin or soup. Progress to regular foods as tolerated. Avoid greasy, spicy, heavy foods. If nausea and/or vomiting occur, drink only clear liquids until the nausea and/or vomiting subsides. Call your physician if vomiting continues.  Special Instructions/Symptoms: Your throat may feel dry or sore from the anesthesia or the breathing tube placed in your throat during surgery. If this causes discomfort, gargle with warm salt water. The discomfort should disappear within 24 hours.  If you had a scopolamine patch placed behind your ear for the management of post- operative nausea and/or vomiting:  1. The medication in the patch is effective for 72 hours, after which it should be removed.  Wrap patch in a tissue and discard in the trash. Wash hands thoroughly with soap and water. 2. You may remove the patch earlier than 72 hours if you experience unpleasant side effects which may include dry mouth, dizziness or visual disturbances. 3. Avoid touching the patch. Wash  your hands with soap and water after contact with the patch.     Regional Anesthesia Blocks  1. Numbness or the inability to move the "blocked" extremity may last from 3-48 hours after placement. The length of time depends on the medication injected and your individual response to the medication. If the numbness is not going away after 48 hours, call your surgeon.  2. The extremity that is blocked will need to be protected until the numbness is gone and the  Strength has returned.  Because you cannot feel it, you will need to take extra care to avoid injury. Because it may be weak, you may have difficulty moving it or using it. You may not know what position it is in without looking at it while the block is in effect.  3. For blocks in the legs and feet, returning to weight bearing and walking needs to be done carefully. You will need to wait until the numbness is entirely gone and the strength has returned. You should be able to move your leg and foot normally before you try and bear weight or walk. You will need someone to be with you when you first try to ensure you do not fall and possibly risk injury.  4. Bruising and tenderness at the needle site are common side effects and will resolve in a few days.  5. Persistent numbness or new problems with movement should be communicated to the surgeon or the [place] [phone] [place] [phone]).   Information for Discharge Teaching: EXPAREL (bupivacaine liposome injectable suspension)   Your surgeon or anesthesiologist gave you EXPAREL(bupivacaine) to help control your pain after surgery.   EXPAREL is a local anesthetic that provides pain relief by numbing the tissue around the surgical site.  EXPAREL is designed to release pain medication over time and can control pain for up to 72 hours.  Depending on how you respond to EXPAREL, you may require less pain medication during your recovery.  Possible side effects:  Temporary loss of sensation or ability to move in the area where bupivacaine was injected.  Nausea, vomiting, constipation  Rarely, numbness and tingling in your mouth or lips, lightheadedness, or anxiety may occur.  Call your doctor right away if you think you may be experiencing any of these sensations, or if you have other questions regarding possible side effects.  Follow all other discharge instructions given to you by your surgeon or nurse. Eat a healthy diet  and drink plenty of water or other fluids.  If you return to the hospital for any reason within 96 hours following the administration of EXPAREL, it is important for health care providers to know that you have received this anesthetic. A teal colored band has been placed on your arm with the date, time and amount of EXPAREL you have received in order to alert and inform your health care providers. Please leave this armband in place for the full 96 hours following administration, and then you may remove the band.

## 2019-04-22 NOTE — Op Note (Addendum)
04/22/2019  1:29 PM  PATIENT:  [name]  63 y.o. female  PRE-OPERATIVE DIAGNOSIS:  left hallux rigidus  POST-OPERATIVE DIAGNOSIS:  Same  Procedure(s): Left hallux metatarsal phalangeal joint cheilectomy and joint resurfacing  SURGEON:  Wylene Simmer, MD  ASSISTANT: none  ANESTHESIA:   MAC, regional  EBL:  minimal   TOURNIQUET:   Total Tourniquet Time Documented: Thigh (Left) - 16 minutes Total: Thigh (Left) - 16 minutes  COMPLICATIONS:  None apparent  DISPOSITION:  Extubated, awake and stable to recovery.  INDICATION FOR PROCEDURE: The patient is a 63 year old female with a long history of left forefoot pain due to hallux rigidus.  She has failed nonoperative treatment to date and presents today for surgical correction of this painful and limiting condition.  The risks and benefits of the alternative treatment options have been discussed in detail.  The patient wishes to proceed with surgery and specifically understands risks of bleeding, infection, nerve damage, blood clots, need for additional surgery, amputation and death.  PROCEDURE IN DETAIL:  After pre operative consent was obtained, and the correct operative site was identified, the patient was brought to the operating room and placed supine on the OR table.  Anesthesia was administered.  Pre-operative antibiotics were administered.  A surgical timeout was taken.  The left lower extremity was prepped and draped in standard sterile fashion with a tourniquet around the thigh.  The extremity was exsanguinated and the tourniquet was inflated to 250 mmHg.  A longitudinal incision was made over the hallux MP joint.  Dissection was carried down through the subcutaneous tissues.  The extensor houses longus and brevis tendons were mobilized and retracted laterally.  They were protected throughout the case.  The dorsal joint capsule was incised and elevated medially and laterally exposing the metatarsal head.  The collateral ligaments  were released.  Dorsal osteophytes were resected from the head of the metatarsal and the base of the proximal phalanx with a rondure.  Once the cheilectomy was complete a K wire was inserted in the center of the metatarsal head just dorsal from the midline.  The 10 mm reamer was then advanced over the K wire to the appropriate depth.  All bone fragments were removed and the wound was irrigated copiously.  The socket was dried appropriately.  A 10 mm Cartiva implant was then inserted without difficulty.  The joint was reduced.  The wound was irrigated copiously.  Dorsal joint capsule was repaired with 2-0 Vicryl.  Subcutaneous tissues were approximated with 3-0 Monocryl.  The skin incision was closed with 3-0 nylon.  Sterile dressings were applied followed by a compression dressing.  The tourniquet was released after application of the dressings.  The patient was awakened from anesthesia and transported to the recovery room in stable condition.   FOLLOW UP PLAN: Weightbearing as tolerated in a flat postop shoe.  Follow-up in the office in 2 weeks for suture removal and to initiate active range of motion.  Plan 6 weeks immobilization in the postop shoe.  DVT prophylaxis is not indicated in this ambulatory patient.

## 2019-04-22 NOTE — Anesthesia Preprocedure Evaluation (Signed)
Anesthesia Evaluation  Patient identified by MRN, date of birth, ID band Patient awake    Reviewed: Allergy & Precautions, NPO status , Patient's Chart, lab work & pertinent test results  History of Anesthesia Complications (+) PONV and history of anesthetic complications  Airway Mallampati: III  TM Distance: <3 FB Neck ROM: Full   Comment: Grade 3/4 with DL, glidescope used grade 1 view with 4 blade Dental no notable dental hx.    Pulmonary neg pulmonary ROS,    Pulmonary exam normal breath sounds clear to auscultation       Cardiovascular negative cardio ROS Normal cardiovascular exam Rhythm:Regular Rate:Normal     Neuro/Psych negative neurological ROS  negative psych ROS   GI/Hepatic Neg liver ROS, GERD  Medicated,  Endo/Other  Morbid obesity  Renal/GU Renal InsufficiencyRenal disease  negative genitourinary   Musculoskeletal negative musculoskeletal ROS (+)   Abdominal   Peds negative pediatric ROS (+)  Hematology negative hematology ROS (+)   Anesthesia Other Findings   Reproductive/Obstetrics negative OB ROS                             Anesthesia Physical  Anesthesia Plan  ASA: III  Anesthesia Plan: MAC   Post-op Pain Management:  Regional for Post-op pain   Induction: Intravenous  PONV Risk Score and Plan:   Airway Management Planned: Mask and Natural Airway  Additional Equipment:   Intra-op Plan:   Post-operative Plan: Extubation in OR  Informed Consent: I have reviewed the patients History and Physical, chart, labs and discussed the procedure including the risks, benefits and alternatives for the proposed anesthesia with the patient or authorized representative who has indicated his/her understanding and acceptance.     Dental advisory given  Plan Discussed with: CRNA, Surgeon and Anesthesiologist  Anesthesia Plan Comments:         Anesthesia Quick  Evaluation

## 2019-04-22 NOTE — Anesthesia Procedure Notes (Signed)
Anesthesia Regional Block: Adductor canal block   Pre-Anesthetic Checklist: ,, timeout performed, Correct Patient, Correct Site, Correct Laterality, Correct Procedure, Correct Position, site marked, Risks and benefits discussed,  Surgical consent,  Pre-op evaluation,  At surgeon's request and post-op pain management  Laterality: Left  Prep: chloraprep       Needles:  Injection technique: Single-shot  Needle Type: Echogenic Stimulator Needle     Needle Length: 5cm  Needle Gauge: 22     Additional Needles:   Procedures:, nerve stimulator,,, ultrasound used (permanent image in chart),,,,  Narrative:  Start time: 04/22/2019 12:15 PM End time: 04/22/2019 12:25 PM Injection made incrementally with aspirations every 5 mL.  Performed by: Personally  Anesthesiologist: Janeece Riggers, MD  Additional Notes: Functioning IV was confirmed and monitors were applied.  A 43mm 22ga Arrow echogenic stimulator needle was used. Sterile prep and drape,hand hygiene and sterile gloves were used. Ultrasound guidance: relevant anatomy identified, needle position confirmed, local anesthetic spread visualized around nerve(s)., vascular puncture avoided.  Image printed for medical record. Negative aspiration and negative test dose prior to incremental administration of local anesthetic. The patient tolerated the procedure well.

## 2019-04-22 NOTE — Anesthesia Postprocedure Evaluation (Signed)
Anesthesia Post Note  Patient: [name]  Procedure(s) Performed: Left hallux metatarsal phalangeal joint cheilectomy and joint resurfacing (Left Foot)     Patient location during evaluation: PACU Anesthesia Type: MAC and Regional Level of consciousness: awake and alert Pain management: pain level controlled Vital Signs Assessment: post-procedure vital signs reviewed and stable Respiratory status: spontaneous breathing, nonlabored ventilation, respiratory function stable and patient connected to nasal cannula oxygen Cardiovascular status: stable and blood pressure returned to baseline Postop Assessment: no apparent nausea or vomiting Anesthetic complications: no    Last Vitals:  Vitals:   04/22/19 1345 04/22/19 1413  BP: (!) 107/56 (!) 106/58  Pulse: (!) 42 (!) 53  Resp: 13 16  Temp:  36.6 C  SpO2: 100% 98%    Last Pain:  Vitals:   04/22/19 1413  TempSrc:   PainSc: 0-No pain                 [name]

## 2019-05-05 ENCOUNTER — Other Ambulatory Visit: Payer: Self-pay

## 2019-05-05 ENCOUNTER — Encounter (INDEPENDENT_AMBULATORY_CARE_PROVIDER_SITE_OTHER): Payer: Self-pay | Admitting: Physician Assistant

## 2019-05-05 ENCOUNTER — Ambulatory Visit (INDEPENDENT_AMBULATORY_CARE_PROVIDER_SITE_OTHER): Payer: 59 | Admitting: Physician Assistant

## 2019-05-05 VITALS — BP 117/67 | HR 72 | Temp 98.3°F | Ht 66.0 in | Wt 193.0 lb

## 2019-05-05 DIAGNOSIS — E669 Obesity, unspecified: Secondary | ICD-10-CM

## 2019-05-05 DIAGNOSIS — E559 Vitamin D deficiency, unspecified: Secondary | ICD-10-CM

## 2019-05-05 DIAGNOSIS — Z6831 Body mass index (BMI) 31.0-31.9, adult: Secondary | ICD-10-CM

## 2019-05-05 DIAGNOSIS — E8881 Metabolic syndrome: Secondary | ICD-10-CM | POA: Diagnosis not present

## 2019-05-05 DIAGNOSIS — Z9189 Other specified personal risk factors, not elsewhere classified: Secondary | ICD-10-CM | POA: Diagnosis not present

## 2019-05-05 MED ORDER — VITAMIN D (ERGOCALCIFEROL) 1.25 MG (50000 UNIT) PO CAPS: 50000.0000 [IU] | ORAL_CAPSULE | ORAL | 0 refills | Status: DC

## 2019-05-05 MED ORDER — METFORMIN HCL 1000 MG PO TABS: 1000.0000 mg | ORAL_TABLET | Freq: Two times a day (BID) | ORAL | 0 refills | Status: DC

## 2019-05-05 MED FILL — metFORMIN HCL 1000 MG TABS: 1000 | 30 days supply | Qty: 60 | Fill #0

## 2019-05-05 MED FILL — VIT D2 1.25 MG (50,000 UNIT: 1.25 MG | 28 days supply | Qty: 4 | Fill #0

## 2019-05-09 NOTE — Progress Notes (Signed)
Chief Complaint:   OBESITY [name] is here to discuss her progress with her obesity treatment plan along with follow-up of her obesity related diagnoses. [name] is on the Category 2 Plan and states she is following her eating plan approximately 60% of the time. [name] states she is exercising 0 minutes 0 times per week.  Today's visit was #: 3 Starting weight: 209 lbs Starting date: 04/29/2018 Today's weight: 193 lbs Today's date: 05/05/2019 Total lbs lost to date: 16 Total lbs lost since last in-office visit: 5  Interim History: [name] reports that she tried journaling, but her MyFitnessPal was not working. She reports cravings in the afternoon and evening. She has not yet started her Contrave.  Subjective:   Vitamin D deficiency  [name]'s Vitamin D level was 42.8 on 11/19/18. She is due for labs. [name] is on vitamin D. She denies nausea, vomiting or muscle weakness.   Insulin resistance  [name] has a diagnosis of insulin resistance based on her elevated fasting insulin level >5. She is on metformin twice daily. [name] denies nausea, vomiting or diarrhea. Her last A1c was 5.2 and her last insulin level was 17.6 (11/19/18). [name] is due for labs. She continues to work on diet and exercise to decrease her risk of diabetes.  Lab Results  Component Value Date   INSULIN 17.6 11/19/2018   INSULIN 25.8 (H) 04/29/2018   Lab Results  Component Value Date   HGBA1C 5.2 11/19/2018   At risk for diabetes mellitus [name] is at higher than average risk for developing diabetes due to her obesity and insulin resistance.   Assessment/Plan:   Vitamin D deficiency Low Vitamin D level contributes to fatigue and are associated with obesity, breast, and colon cancer. [name] agrees to continue to take prescription Vitamin D @50 ,000 IU every week #4 with no refills and she will follow-up for routine testing of Vitamin D, at least 2-3 times per year to avoid over-replacement.  Insulin resistance    [name] will continue to work on weight loss, exercise, and decreasing simple carbohydrates to help decrease the risk of diabetes. [name] agreed to continue metformin 1,000 mg two times daily with a meal #60 with no refills and follow-up with [name] as directed to closely monitor her progress.  At risk for diabetes mellitus [name] was given approximately 15 minutes of diabetes education and counseling today. We discussed intensive lifestyle modifications today with an emphasis on weight loss as well as increasing exercise and decreasing simple carbohydrates in her diet. We also reviewed medication options with an emphasis on risk versus benefit of those discussed.   Obesity [name] is currently in the action stage of change. As such, her goal is to continue with weight loss efforts. She has agreed to on the Category 2 Plan and keeping a food journal and adhering to recommended goals of 400 to 500 calories and 35 grams of protein at supper daily.   Exercise goals: For substantial health benefits, adults should do at least 150 minutes (2 hours and 30 minutes) a week of moderate-intensity, or 75 minutes (1 hour and 15 minutes) a week of vigorous-intensity aerobic physical activity, or an equivalent combination of moderate- and vigorous-intensity aerobic activity. Aerobic activity should be performed in episodes of at least 10 minutes, and preferably, it should be spread throughout the week. Adults should also include muscle-strengthening activities that involve all major muscle groups on 2 or more days a week.  Behavioral modification strategies: meal planning and cooking  strategies and keeping healthy foods in the home.  [name] has agreed to follow-up with our clinic in 3 weeks. She was informed of the importance of frequent follow-up visits to maximize her success with intensive lifestyle modifications for her multiple health conditions.   Objective:   Blood pressure 117/67, pulse 72, temperature 98.3 F (36.8  C), temperature source Oral, height 5\' 6"  (1.676 m), weight 193 lb (87.5 kg), SpO2 96 %. Body mass index is 31.15 kg/m.  General: Cooperative, alert, well developed, in no acute distress. HEENT: Conjunctivae and lids unremarkable. Cardiovascular: Regular rhythm.  Lungs: Normal work of breathing. Neurologic: No focal deficits.   Lab Results  Component Value Date   CREATININE 1.46 (H) 04/05/2019   BUN 23 04/05/2019   NA 139 04/05/2019   K 4.6 04/05/2019   CL 102 04/05/2019   CO2 28 04/05/2019   Lab Results  Component Value Date   ALT 15 11/19/2018   AST 19 11/19/2018   ALKPHOS 73 11/19/2018   BILITOT 0.2 11/19/2018   Lab Results  Component Value Date   HGBA1C 5.2 11/19/2018   HGBA1C 5.5 04/29/2018   Lab Results  Component Value Date   INSULIN 17.6 11/19/2018   INSULIN 25.8 (H) 04/29/2018   Lab Results  Component Value Date   TSH 0.564 04/29/2018   Lab Results  Component Value Date   CHOL 224 (H) 11/19/2018   HDL 49 11/19/2018   LDLCALC 135 (H) 11/19/2018   TRIG 200 (H) 11/19/2018   Lab Results  Component Value Date   WBC 5.3 04/29/2018   HGB 14.5 04/29/2018   HCT 42.5 04/29/2018   MCV 91 04/29/2018   PLT 268 10/04/2016   No results found for: IRON, TIBC, FERRITIN   Ref. Range 11/19/2018 09:45  Vitamin D, 25-Hydroxy Latest Ref Range: 30.0 - 100.0 ng/mL 42.8    Attestation Statements:   Reviewed by clinician on day of visit: allergies, medications, problem list, medical history, surgical history, family history, social history, and previous encounter notes.  Corey Skains, am acting as Location manager for [employer], PA-C.  I have reviewed the above documentation for accuracy and completeness, and I agree with the above. Abby Potash, PA-C

## 2019-05-26 ENCOUNTER — Other Ambulatory Visit: Payer: Self-pay

## 2019-05-26 ENCOUNTER — Inpatient Hospital Stay: Payer: 59 | Attending: Hematology

## 2019-05-26 DIAGNOSIS — Z6831 Body mass index (BMI) 31.0-31.9, adult: Secondary | ICD-10-CM | POA: Insufficient documentation

## 2019-05-26 DIAGNOSIS — N183 Chronic kidney disease, stage 3 unspecified: Secondary | ICD-10-CM | POA: Diagnosis not present

## 2019-05-26 DIAGNOSIS — K219 Gastro-esophageal reflux disease without esophagitis: Secondary | ICD-10-CM | POA: Diagnosis not present

## 2019-05-26 DIAGNOSIS — Z791 Long term (current) use of non-steroidal anti-inflammatories (NSAID): Secondary | ICD-10-CM | POA: Insufficient documentation

## 2019-05-26 DIAGNOSIS — C786 Secondary malignant neoplasm of retroperitoneum and peritoneum: Secondary | ICD-10-CM | POA: Diagnosis not present

## 2019-05-26 DIAGNOSIS — F419 Anxiety disorder, unspecified: Secondary | ICD-10-CM | POA: Insufficient documentation

## 2019-05-26 DIAGNOSIS — M1990 Unspecified osteoarthritis, unspecified site: Secondary | ICD-10-CM | POA: Diagnosis not present

## 2019-05-26 DIAGNOSIS — Z79818 Long term (current) use of other agents affecting estrogen receptors and estrogen levels: Secondary | ICD-10-CM | POA: Insufficient documentation

## 2019-05-26 DIAGNOSIS — C772 Secondary and unspecified malignant neoplasm of intra-abdominal lymph nodes: Secondary | ICD-10-CM | POA: Insufficient documentation

## 2019-05-26 DIAGNOSIS — Z90710 Acquired absence of both cervix and uterus: Secondary | ICD-10-CM | POA: Insufficient documentation

## 2019-05-26 DIAGNOSIS — Z9221 Personal history of antineoplastic chemotherapy: Secondary | ICD-10-CM | POA: Insufficient documentation

## 2019-05-26 DIAGNOSIS — Z79899 Other long term (current) drug therapy: Secondary | ICD-10-CM | POA: Diagnosis not present

## 2019-05-26 DIAGNOSIS — E669 Obesity, unspecified: Secondary | ICD-10-CM | POA: Diagnosis not present

## 2019-05-26 DIAGNOSIS — Z7984 Long term (current) use of oral hypoglycemic drugs: Secondary | ICD-10-CM | POA: Insufficient documentation

## 2019-05-26 DIAGNOSIS — Z171 Estrogen receptor negative status [ER-]: Secondary | ICD-10-CM | POA: Insufficient documentation

## 2019-05-26 DIAGNOSIS — Z03818 Encounter for observation for suspected exposure to other biological agents ruled out: Secondary | ICD-10-CM | POA: Diagnosis not present

## 2019-05-26 DIAGNOSIS — Z90722 Acquired absence of ovaries, bilateral: Secondary | ICD-10-CM | POA: Insufficient documentation

## 2019-05-26 DIAGNOSIS — C569 Malignant neoplasm of unspecified ovary: Secondary | ICD-10-CM | POA: Insufficient documentation

## 2019-05-26 LAB — BASIC METABOLIC PANEL
Anion gap: 12 (ref 5–15)
BUN: 25 mg/dL — ABNORMAL HIGH (ref 8–23)
CO2: 27 mmol/L (ref 22–32)
Calcium: 9.5 mg/dL (ref 8.9–10.3)
Chloride: 101 mmol/L (ref 98–111)
Creatinine, Ser: 1.11 mg/dL — ABNORMAL HIGH (ref 0.44–1.00)
GFR calc Af Amer: >60 mL/min (ref >60)
GFR calc non Af Amer: 53 mL/min — ABNORMAL LOW (ref >60)
Glucose, Bld: 86 mg/dL (ref 70–99)
Potassium: 4.3 mmol/L (ref 3.5–5.1)
Sodium: 140 mmol/L (ref 135–145)

## 2019-05-31 ENCOUNTER — Ambulatory Visit (INDEPENDENT_AMBULATORY_CARE_PROVIDER_SITE_OTHER): Payer: 59 | Admitting: Physician Assistant

## 2019-05-31 ENCOUNTER — Encounter (INDEPENDENT_AMBULATORY_CARE_PROVIDER_SITE_OTHER): Payer: Self-pay | Admitting: Physician Assistant

## 2019-05-31 ENCOUNTER — Other Ambulatory Visit: Payer: Self-pay

## 2019-05-31 VITALS — BP 106/66 | HR 80 | Temp 97.6°F | Ht 66.0 in | Wt 192.0 lb

## 2019-05-31 DIAGNOSIS — E669 Obesity, unspecified: Secondary | ICD-10-CM

## 2019-05-31 DIAGNOSIS — E7849 Other hyperlipidemia: Secondary | ICD-10-CM

## 2019-05-31 DIAGNOSIS — E8881 Metabolic syndrome: Secondary | ICD-10-CM | POA: Diagnosis not present

## 2019-05-31 DIAGNOSIS — E559 Vitamin D deficiency, unspecified: Secondary | ICD-10-CM

## 2019-05-31 DIAGNOSIS — R739 Hyperglycemia, unspecified: Secondary | ICD-10-CM | POA: Diagnosis not present

## 2019-05-31 DIAGNOSIS — Z6831 Body mass index (BMI) 31.0-31.9, adult: Secondary | ICD-10-CM

## 2019-05-31 DIAGNOSIS — Z9189 Other specified personal risk factors, not elsewhere classified: Secondary | ICD-10-CM

## 2019-05-31 MED ORDER — METFORMIN HCL 1000 MG PO TABS: 1000.0000 mg | ORAL_TABLET | Freq: Two times a day (BID) | ORAL | 0 refills | Status: DC

## 2019-05-31 MED ORDER — VITAMIN D (ERGOCALCIFEROL) 1.25 MG (50000 UNIT) PO CAPS: 50000.0000 [IU] | ORAL_CAPSULE | ORAL | 0 refills | Status: DC

## 2019-05-31 MED FILL — metFORMIN HCL 1000 MG TABS: 1000 | 30 days supply | Qty: 60 | Fill #0

## 2019-05-31 MED FILL — VIT D2 1.25 MG (50,000 UNIT: 1.25 MG | 28 days supply | Qty: 4 | Fill #0

## 2019-05-31 NOTE — Progress Notes (Signed)
Chief Complaint:   OBESITY [name] is here to discuss her progress with her obesity treatment plan along with follow-up of her obesity related diagnoses. [name] is on the Category 2 Plan and states she is following her eating plan approximately 80% of the time. [name] states she is exercising for 0 minutes 0 times per week.  Today's visit was #: 20 Starting weight: 209 lbs Starting date: 05/05/2019 Today's weight: 192 lbs Today's date: 05/31/2019 Total lbs lost to date: 17 lbs Total lbs lost since last in-office visit: 1 lb  Interim History: [name] states that she has been eating apple pie recently because it is in her house and she has trouble controlling her portions. She denies cravings.  She is not getting all of her protein in, especially at dinner.  She has not yet started Contrave.  Subjective:   1. Vitamin D deficiency Sunday's Vitamin D level was 42.8 on 11/19/2018. She is currently taking vit D. She denies nausea, vomiting or muscle weakness.  2. Insulin resistance [name] has a diagnosis of insulin resistance based on her elevated fasting insulin level >5. She continues to work on diet and exercise to decrease her risk of diabetes.  She says that she is forgetting to take her metformin twice daily.  Lab Results  Component Value Date   INSULIN 17.6 11/19/2018   INSULIN 25.8 (H) 04/29/2018   Lab Results  Component Value Date   HGBA1C 5.2 11/19/2018   3. Other hyperlipidemia [name] has hyperlipidemia and has been trying to improve her cholesterol levels with intensive lifestyle modification including a low saturated fat diet, exercise and weight loss. She denies any chest pain, claudication or myalgias.  Lab Results  Component Value Date   ALT 15 11/19/2018   AST 19 11/19/2018   ALKPHOS 73 11/19/2018   BILITOT 0.2 11/19/2018   Lab Results  Component Value Date   CHOL 224 (H) 11/19/2018   HDL 49 11/19/2018   LDLCALC 135 (H) 11/19/2018   TRIG 200 (H) 11/19/2018   4.  Hyperglycemia [name] has had no recent episodes.  Her last A1c was 5.2.  No cravings.  She is due for labs.  5. At risk for diabetes mellitus [name] is at higher than average risk for developing diabetes due to her obesity.   Assessment/Plan:   1. Vitamin D deficiency Low Vitamin D level contributes to fatigue and are associated with obesity, breast, and colon cancer. She agrees to continue to take prescription Vitamin D @50 ,000 IU every week and will follow-up for routine testing of Vitamin D, at least 2-3 times per year to avoid over-replacement. - Vitamin D, Ergocalciferol, (DRISDOL) 1.25 MG (50000 UNIT) CAPS capsule; Take 1 capsule (50,000 Units total) by mouth every 7 (seven) days.  Dispense: 4 capsule; Refill: 0 - VITAMIN D 25 Hydroxy (Vit-D Deficiency, Fractures)  2. Insulin resistance [name] will continue to work on weight loss, exercise, and decreasing simple carbohydrates to help decrease the risk of diabetes. [name] agreed to follow-up with [name] as directed to closely monitor her progress. - metFORMIN (GLUCOPHAGE) 1000 MG tablet; Take 1 tablet (1,000 mg total) by mouth 2 (two) times daily with a meal.  Dispense: 60 tablet; Refill: 0  3. Other hyperlipidemia Cardiovascular risk and specific lipid/LDL goals reviewed.  We discussed several lifestyle modifications today and [name] will continue to work on diet, exercise and weight loss efforts. Orders and follow up as documented in patient record.   Counseling Intensive lifestyle modifications are the  first line treatment for this issue. . Dietary changes: Increase soluble fiber. Decrease simple carbohydrates. . Exercise changes: Moderate to vigorous-intensity aerobic activity 150 minutes per week if tolerated. . Lipid-lowering medications: see documented in medical record. - Comprehensive metabolic panel - Hemoglobin A1c - Insulin, random - Lipid Panel With LDL/HDL Ratio  4. Hyperglycemia Fasting labs will be obtained and results with  be discussed with [name] in 2 weeks at her follow up visit. In the meanwhile [name] was started on a lower simple carbohydrate diet and will work on weight loss efforts. - Hemoglobin A1c - Insulin, random  5. At risk for diabetes mellitus [name] was given approximately 15 minutes of diabetes education and counseling today. We discussed intensive lifestyle modifications today with an emphasis on weight loss as well as increasing exercise and decreasing simple carbohydrates in her diet. We also reviewed medication options with an emphasis on risk versus benefit of those discussed.   Repetitive spaced learning was employed today to elicit superior memory formation and behavioral change.  6. Class 1 obesity with serious comorbidity and body mass index (BMI) of 31.0 to 31.9 in adult, unspecified obesity type [name] is currently in the action stage of change. As such, her goal is to continue with weight loss efforts. She has agreed to the Category 2 Plan.   Exercise goals: For substantial health benefits, adults should do at least 150 minutes (2 hours and 30 minutes) a week of moderate-intensity, or 75 minutes (1 hour and 15 minutes) a week of vigorous-intensity aerobic physical activity, or an equivalent combination of moderate- and vigorous-intensity aerobic activity. Aerobic activity should be performed in episodes of at least 10 minutes, and preferably, it should be spread throughout the week.  Behavioral modification strategies: increasing lean protein intake and meal planning and cooking strategies.  [name] has agreed to follow-up with our clinic in 2 weeks. She was informed of the importance of frequent follow-up visits to maximize her success with intensive lifestyle modifications for her multiple health conditions.   [name] was informed we would discuss her lab results at her next visit unless there is a critical issue that needs to be addressed sooner. [name] agreed to keep her next visit at the  agreed upon time to discuss these results.  Objective:   Blood pressure 106/66, pulse 80, temperature 97.6 F (36.4 C), temperature source Oral, height 5\' 6"  (1.676 m), weight 192 lb (87.1 kg), SpO2 97 %. Body mass index is 30.99 kg/m.  General: Cooperative, alert, well developed, in no acute distress. HEENT: Conjunctivae and lids unremarkable. Cardiovascular: Regular rhythm.  Lungs: Normal work of breathing. Neurologic: No focal deficits.   Lab Results  Component Value Date   CREATININE 1.11 (H) 05/26/2019   BUN 25 (H) 05/26/2019   NA 140 05/26/2019   K 4.3 05/26/2019   CL 101 05/26/2019   CO2 27 05/26/2019   Lab Results  Component Value Date   ALT 15 11/19/2018   AST 19 11/19/2018   ALKPHOS 73 11/19/2018   BILITOT 0.2 11/19/2018   Lab Results  Component Value Date   HGBA1C 5.2 11/19/2018   HGBA1C 5.5 04/29/2018   Lab Results  Component Value Date   INSULIN 17.6 11/19/2018   INSULIN 25.8 (H) 04/29/2018   Lab Results  Component Value Date   TSH 0.564 04/29/2018   Lab Results  Component Value Date   CHOL 224 (H) 11/19/2018   HDL 49 11/19/2018   LDLCALC 135 (H) 11/19/2018   TRIG 200 (  H) 11/19/2018   Lab Results  Component Value Date   WBC 5.3 04/29/2018   HGB 14.5 04/29/2018   HCT 42.5 04/29/2018   MCV 91 04/29/2018   PLT 268 10/04/2016   Attestation Statements:   Reviewed by clinician on day of visit: allergies, medications, problem list, medical history, surgical history, family history, social history, and previous encounter notes.  I, Water quality scientist, CMA, am acting as Location manager for [employer], PA-C.  I have reviewed the above documentation for accuracy and completeness, and I agree with the above. Abby Potash, PA-C

## 2019-06-01 LAB — COMPREHENSIVE METABOLIC PANEL
ALT: 19 IU/L (ref 0–32)
AST: 19 IU/L (ref 0–40)
Albumin/Globulin Ratio: 2.1 (ref 1.2–2.2)
Albumin: 4.5 g/dL (ref 3.8–4.8)
Alkaline Phosphatase: 72 IU/L (ref 39–117)
BUN/Creatinine Ratio: 17 (ref 12–28)
BUN: 18 mg/dL (ref 8–27)
Bilirubin Total: 0.3 mg/dL (ref 0.0–1.2)
CO2: 22 mmol/L (ref 20–29)
Calcium: 9.8 mg/dL (ref 8.7–10.3)
Chloride: 105 mmol/L (ref 96–106)
Creatinine, Ser: 1.04 mg/dL — ABNORMAL HIGH (ref 0.57–1.00)
GFR calc Af Amer: 67 mL/min/{1.73_m2} (ref >59)
GFR calc non Af Amer: 58 mL/min/{1.73_m2} — ABNORMAL LOW (ref >59)
Globulin, Total: 2.1 g/dL (ref 1.5–4.5)
Glucose: 97 mg/dL (ref 65–99)
Potassium: 4.7 mmol/L (ref 3.5–5.2)
Sodium: 141 mmol/L (ref 134–144)
Total Protein: 6.6 g/dL (ref 6.0–8.5)

## 2019-06-01 LAB — LIPID PANEL WITH LDL/HDL RATIO
Cholesterol, Total: 230 mg/dL — ABNORMAL HIGH (ref 100–199)
HDL: 50 mg/dL (ref >39)
LDL Chol Calc (NIH): 142 mg/dL — ABNORMAL HIGH (ref 0–99)
LDL/HDL Ratio: 2.8 ratio (ref 0.0–3.2)
Triglycerides: 211 mg/dL — ABNORMAL HIGH (ref 0–149)
VLDL Cholesterol Cal: 38 mg/dL (ref 5–40)

## 2019-06-01 LAB — VITAMIN D 25 HYDROXY (VIT D DEFICIENCY, FRACTURES): Vit D, 25-Hydroxy: 54 ng/mL (ref 30.0–100.0)

## 2019-06-01 LAB — HEMOGLOBIN A1C
Est. average glucose Bld gHb Est-mCnc: 105 mg/dL
Hgb A1c MFr Bld: 5.3 % (ref 4.8–5.6)

## 2019-06-01 LAB — INHIBIN B: Inhibin B: 90.2 pg/mL — ABNORMAL HIGH (ref 0.0–16.9)

## 2019-06-01 LAB — INSULIN, RANDOM: INSULIN: 16.5 u[IU]/mL (ref 2.6–24.9)

## 2019-06-02 ENCOUNTER — Ambulatory Visit (HOSPITAL_COMMUNITY)
Admission: RE | Admit: 2019-06-02 | Discharge: 2019-06-02 | Disposition: A | Discharge status: Home / Self Care | Payer: 59 | Source: Ambulatory Visit | Attending: Gynecologic Oncology | Admitting: Gynecologic Oncology

## 2019-06-02 ENCOUNTER — Other Ambulatory Visit: Payer: Self-pay

## 2019-06-02 DIAGNOSIS — C569 Malignant neoplasm of unspecified ovary: Secondary | ICD-10-CM

## 2019-06-02 DIAGNOSIS — K760 Fatty (change of) liver, not elsewhere classified: Secondary | ICD-10-CM | POA: Diagnosis not present

## 2019-06-02 DIAGNOSIS — D3502 Benign neoplasm of left adrenal gland: Secondary | ICD-10-CM | POA: Diagnosis not present

## 2019-06-02 LAB — ANTI MULLERIAN HORMONE: ANTI-MULLERIAN HORMONE (AMH): 9.04 ng/mL

## 2019-06-02 MED ORDER — GADOBUTROL 1 MMOL/ML IV SOLN
8.0000 mL | Freq: Once | INTRAVENOUS | Status: AC | PRN
  Administered 2019-06-02: 08:00:00 8 mL via INTRAVENOUS

## 2019-06-03 DIAGNOSIS — M2022 Hallux rigidus, left foot: Secondary | ICD-10-CM | POA: Diagnosis not present

## 2019-06-03 DIAGNOSIS — Z4889 Encounter for other specified surgical aftercare: Secondary | ICD-10-CM | POA: Diagnosis not present

## 2019-06-03 NOTE — Progress Notes (Signed)
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 Unfortunately, no sufficient tissue for Foundation One testing. ER was 50% and PR 90%.  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     - 09/2000 Chemotherapy   6 cycles of IP cisplatin and etoposide    02/2009 Relapse/Recurrence   CT showed increased size of nodules in pelvis   2010 Surgery   Exlap with section of tumor nodules near cecum and left pelvic sidewall. Tumor: ER negative, PR positive    Treatment Plan Change   Alternated 2 week courses of Megace and Tamoxifen - ended 03/2012   03/2012 PET scan   CT - progressive disease   2013 Treatment Plan Change   Letrozole   01/2016 Imaging   MRI showed progressive disease   2017 Treatment Plan Change   Two weeks of alternating Tamoxifen 20mg  daily and then Megace 40mg  TID   08/2016 Imaging   MRI showed progressive disease with peritoneal implants near liver, in pelvis   01/2017 Treatment Plan Change   Lupron 11.25 q 3 months   11/2017 Imaging   Overall mixed response.   Mixed cystic/solid lesions in the left pelvis are mildly improved.   Cystic peritoneal disease, including the dominant lesion along the posterior right hepatic lobe, is mildly progressed.    Subcapsular lesion along the posterior right hepatic lobe is unchanged.   03/2018 Imaging   MRI A/P: Mixed response of individual peritoneal metastases in the pelvis, as described above. Overall, there has been no significant change in bulk of disease.   Stable cystic peritoneal metastatic disease along the capsular surfaces of the liver and spleen.   No new sites of metastatic disease identified within the abdomen or pelvis.   08/2018 Imaging   MRI A/P: Status post hysterectomy and bilateral salpingo-oophorectomy.   Mixed cystic/solid peritoneal implants in the abdomen/pelvis, as above. Dominant cystic implant along the posterior liver surface is mildly increased. Remaining lesions are overall grossly unchanged.   No new lesions are identified.   01/28/2019 Imaging   Mri A/P: 1. Relatively similar appearance of peritoneal metastasis. A posterior right hepatic capsular based lesion is similar to minimally decreased in size. Left pelvic implants are primarily similar with possible enlargement of an anterior high left pelvic cystic implant. No new disease identified. 2.  Aortic Atherosclerosis (ICD10-I70.0). 3. Hepatic steatosis. 4. Left adrenal adenoma.     Interval History: Overall, the patient is doing well since her last visit which was by phone with Dr. Fermin Schwab.  At the end of December, she had surgery on her left big toe.  She continues on Contrave and is working with our nutritionist for weight loss.  She is down from approximately 215 pounds.  She denies any vaginal bleeding or discharge.  She denies abdominal or pelvic pain.  She reports regular bowel function.  She has had  some recurrence of her stress urinary incontinence after sling procedure years ago.  She endorses a good appetite without nausea or emesis.  In terms of her more recent treatment history, she began Lupron 11.25 mg IM every 3 months in October 2018.    She initially had response on imaging and  more recently has had stable disease or slight progression.  Past Medical/Surgical History: Past Medical History:  Diagnosis Date  . Anxiety   . Arthritis   . Cervical syndrome   . CKD (chronic kidney disease), stage III   . Constipation   . COVID-19 03/26/2019  . Fatty liver   . GERD (gastroesophageal reflux disease)   . Granulosa cell tumor of ovary   . History of hiatal hernia   . Joint pain   . Obesity   . Osteoarthritis   . Ovarian cancer ([place])   . PONV (postoperative nausea and vomiting)    history of n/v,  past surgeries no n/v  . Sleep apnea     Past Surgical History:  Procedure Laterality Date  . ABDOMINAL HYSTERECTOMY     TAH/BSO  . APPENDECTOMY    . EXPLORATORY LAPAROTOMY     for bowel obstruction  . Secondary tumor debulking  2002  . SHOULDER SURGERY     2017 left  . Tumor debulking  2010  . VENTRAL HERNIA REPAIR      Family History  Problem Relation Age of Onset  . Basal cell carcinoma Mother   . Thyroid disease Mother   . Stroke Father   . Colon cancer Father   . Hypertension Father   . Heart disease Father   . Colon cancer Paternal Uncle     Social History   Socioeconomic History  . Marital status: Married    Spouse name: Zohal Reburn  . Number of children: 2  . Years of education: Not on file  . Highest education level: Not on file  Occupational History  . Occupation: Therapist, sports  Tobacco Use  . Smoking status: Never Smoker  . Smokeless tobacco: Never Used  Substance and Sexual Activity  . Alcohol use: Yes    Comment: One 8oz glass, socially  . Drug use: No  . Sexual activity: Not Currently  Other Topics Concern  . Not on file  Social History Narrative  . Not on file   Social Determinants of Health   Financial Resource Strain:   . Difficulty of Paying Living Expenses: Not on file  Food Insecurity:   . Worried About Charity fundraiser in the Last Year: Not on file  . Ran Out of Food in the Last Year: Not on file  Transportation  Needs:   . Lack of Transportation (Medical): Not on file  . Lack of Transportation (Non-Medical): Not on file  Physical Activity:   . Days of Exercise per Week: Not on file  . Minutes of Exercise per Session: Not on file  Stress:   . Feeling of Stress : Not on file  Social Connections:   . Frequency of Communication with Friends and Family: Not on file  . Frequency of Social Gatherings with Friends and Family: Not on file  . Attends Religious Services: Not on file  . Active Member of Clubs or Organizations: Not on file  . Attends Archivist Meetings: Not on file  . Marital Status: Not on file    Review of Systems: Denies appetite changes, fevers, chills, fatigue, unexplained weight changes. Denies hearing loss, neck lumps  or masses, mouth sores, ringing in ears or voice changes. Denies cough or wheezing.  Denies shortness of breath. Denies chest pain or palpitations. Denies leg swelling. Denies abdominal distention, pain, blood in stools, constipation, diarrhea, nausea, vomiting, or early satiety. Denies pain with intercourse, dysuria, frequency, hematuria or incontinence. Denies hot flashes, pelvic pain, vaginal bleeding or vaginal discharge.   Denies joint pain, back pain or muscle pain/cramps. Denies itching, rash, or wounds. Denies dizziness, headaches, numbness or seizures. Denies swollen lymph nodes or glands, denies easy bruising or bleeding. Denies anxiety, depression, confusion, or decreased concentration.  Physical Exam: BP 126/63   Pulse (!) 53   Temp (!) 97.5 F (36.4 C) (Temporal)   Resp 16   Ht 5\' 6"  (1.676 m)   Wt 194 lb (88 kg)   SpO2 100%   BMI 31.31 kg/m  General: Alert, oriented, no acute distress. HEENT: Atraumatic, normocephalic, sclera anicteric. Chest: Unlabored breathing on room air. Abdomen: Obese, soft, nontender.  No masses or hepatosplenomegaly appreciated.  Well-healed scars. Extremities: Grossly normal range of motion.  Warm, well  perfused.  No edema bilaterally. Skin: No rashes or lesions noted. Lymphatics: No cervical, supraclavicular, or inguinal adenopathy. GU: Normal appearing external genitalia without erythema, excoriation, or lesions.  Speculum exam reveals mildly atrophic vaginal mucosa, cuff intact no lesions or masses.  Bimanual exam reveals smooth cuff, no nodularity or masses.  Rectovaginal exam deferred.  Laboratory & Radiologic Studies: MRI A/P on 2/10: 1. Potential slight enlargement of dominant cystic area and solid component, associated with rind like signal variation on T2 along the inferior right hepatic margin, also potentially slightly increased. Findings may still be within the realm of measurement and technical variation. Close attention on follow-up. 2. Subtle cystic changes along the cephalad margin of the spleen are difficult to see on previous imaging, perhaps new compared with prior imaging studies. 3. Pelvic implants and left lower quadrant lesion with similar size, of the area along the left iliac vasculature may be slightly larger than on the prior study. 4. Signs of extensive retroperitoneal and pelvic lymphadenectomy. 5. Hepatic steatosis. 6. 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  Assessment & Plan: DEMETA LIGHTBODY is a 64 y.o. woman with recurrent granulosa cell tumor.   The patient has a long and extensive history of granulosa cell tumor initially diagnosed in 1994.  She remains relatively asymptomatic and has been on Lupron injections since the end of 2018.  On her recent imaging, there is some evidence of disease progression.  Based on her disease burden, we discussed that I do not think she is a candidate for any surgical intervention.  I would favor that we change therapies given her tumor markers, which continue to increase over time and given some evidence of progression on imaging.  We discussed options including cytotoxic  chemotherapy (carboplatin/Taxol combination therapy, single agent bevacizumab), combination of everolimus and Aromasin (there is some data that this commendation can help reset the tumors sensitivity to hormonal therapy), and combination of letrozole and Metformin.  The patient is already on Metformin, and after our discussion, she is wanting to avoid significant side effects and cytotoxic therapy at this time if possible.  I recommended that she meet with our medical oncologist, Dr. Alvy Bimler, to establish a relationship and to help with counseling and treatment moving forward.  The patient was open to this and an appointment was scheduled on Monday.  22 minutes of total time was  spent for this patient encounter, including preparation, face-to-face counseling with the patient and coordination of care, and documentation of the encounter.  Jeral Pinch, MD  Division of Gynecologic Oncology  Department of Obstetrics and Gynecology  [facility]

## 2019-06-04 ENCOUNTER — Encounter: Payer: Self-pay | Admitting: Oncology

## 2019-06-04 ENCOUNTER — Encounter: Payer: Self-pay | Admitting: Gynecologic Oncology

## 2019-06-04 ENCOUNTER — Inpatient Hospital Stay (HOSPITAL_BASED_OUTPATIENT_CLINIC_OR_DEPARTMENT_OTHER): Payer: 59 | Admitting: Gynecologic Oncology

## 2019-06-04 ENCOUNTER — Other Ambulatory Visit: Payer: Self-pay

## 2019-06-04 VITALS — BP 126/63 | HR 53 | Temp 97.5°F | Resp 16 | Ht 66.0 in | Wt 194.0 lb

## 2019-06-04 DIAGNOSIS — C569 Malignant neoplasm of unspecified ovary: Secondary | ICD-10-CM | POA: Diagnosis not present

## 2019-06-04 DIAGNOSIS — C786 Secondary malignant neoplasm of retroperitoneum and peritoneum: Secondary | ICD-10-CM

## 2019-06-04 DIAGNOSIS — F419 Anxiety disorder, unspecified: Secondary | ICD-10-CM | POA: Diagnosis not present

## 2019-06-04 DIAGNOSIS — Z90722 Acquired absence of ovaries, bilateral: Secondary | ICD-10-CM | POA: Diagnosis not present

## 2019-06-04 DIAGNOSIS — Z171 Estrogen receptor negative status [ER-]: Secondary | ICD-10-CM | POA: Diagnosis not present

## 2019-06-04 DIAGNOSIS — Z90710 Acquired absence of both cervix and uterus: Secondary | ICD-10-CM

## 2019-06-04 DIAGNOSIS — C772 Secondary and unspecified malignant neoplasm of intra-abdominal lymph nodes: Secondary | ICD-10-CM

## 2019-06-04 DIAGNOSIS — Z9221 Personal history of antineoplastic chemotherapy: Secondary | ICD-10-CM | POA: Diagnosis not present

## 2019-06-04 DIAGNOSIS — Z79818 Long term (current) use of other agents affecting estrogen receptors and estrogen levels: Secondary | ICD-10-CM | POA: Diagnosis not present

## 2019-06-04 NOTE — Patient Instructions (Signed)
It was wonderful to meet you today.  I will follow up on the discussion that you have after meeting with Dr. Alvy Bimler on Monday.  We will plan based on that when to repeat imaging.  Please call the clinic if you need anything at [phone].

## 2019-06-07 ENCOUNTER — Encounter: Payer: Self-pay | Admitting: Oncology

## 2019-06-07 ENCOUNTER — Inpatient Hospital Stay (HOSPITAL_BASED_OUTPATIENT_CLINIC_OR_DEPARTMENT_OTHER): Payer: 59 | Admitting: Hematology and Oncology

## 2019-06-07 ENCOUNTER — Encounter: Payer: Self-pay | Admitting: Hematology and Oncology

## 2019-06-07 ENCOUNTER — Other Ambulatory Visit: Payer: Self-pay

## 2019-06-07 VITALS — BP 123/71 | HR 52 | Resp 18 | Ht 66.0 in | Wt 193.3 lb

## 2019-06-07 DIAGNOSIS — C569 Malignant neoplasm of unspecified ovary: Secondary | ICD-10-CM | POA: Diagnosis not present

## 2019-06-07 DIAGNOSIS — E669 Obesity, unspecified: Secondary | ICD-10-CM

## 2019-06-07 DIAGNOSIS — C772 Secondary and unspecified malignant neoplasm of intra-abdominal lymph nodes: Secondary | ICD-10-CM | POA: Diagnosis not present

## 2019-06-07 DIAGNOSIS — C786 Secondary malignant neoplasm of retroperitoneum and peritoneum: Secondary | ICD-10-CM | POA: Diagnosis not present

## 2019-06-07 DIAGNOSIS — G8929 Other chronic pain: Secondary | ICD-10-CM | POA: Diagnosis not present

## 2019-06-07 DIAGNOSIS — Z90722 Acquired absence of ovaries, bilateral: Secondary | ICD-10-CM | POA: Diagnosis not present

## 2019-06-07 DIAGNOSIS — Z9221 Personal history of antineoplastic chemotherapy: Secondary | ICD-10-CM | POA: Diagnosis not present

## 2019-06-07 DIAGNOSIS — M549 Dorsalgia, unspecified: Secondary | ICD-10-CM

## 2019-06-07 DIAGNOSIS — Z79818 Long term (current) use of other agents affecting estrogen receptors and estrogen levels: Secondary | ICD-10-CM | POA: Diagnosis not present

## 2019-06-07 DIAGNOSIS — Z7189 Other specified counseling: Secondary | ICD-10-CM | POA: Diagnosis not present

## 2019-06-07 DIAGNOSIS — F419 Anxiety disorder, unspecified: Secondary | ICD-10-CM | POA: Diagnosis not present

## 2019-06-07 DIAGNOSIS — Z6831 Body mass index (BMI) 31.0-31.9, adult: Secondary | ICD-10-CM | POA: Diagnosis not present

## 2019-06-07 DIAGNOSIS — Z171 Estrogen receptor negative status [ER-]: Secondary | ICD-10-CM | POA: Diagnosis not present

## 2019-06-07 DIAGNOSIS — Z90710 Acquired absence of both cervix and uterus: Secondary | ICD-10-CM | POA: Diagnosis not present

## 2019-06-07 NOTE — Progress Notes (Signed)
Requested PD-L1 on accession [id number] with [facility] pathology via email.

## 2019-06-08 ENCOUNTER — Encounter: Payer: Self-pay | Admitting: Hematology and Oncology

## 2019-06-08 DIAGNOSIS — M549 Dorsalgia, unspecified: Secondary | ICD-10-CM | POA: Insufficient documentation

## 2019-06-08 DIAGNOSIS — Z7189 Other specified counseling: Secondary | ICD-10-CM | POA: Insufficient documentation

## 2019-06-08 DIAGNOSIS — G8929 Other chronic pain: Secondary | ICD-10-CM | POA: Insufficient documentation

## 2019-06-08 NOTE — Assessment & Plan Note (Signed)
I have reviewed her chart extensively and summarized some of the findings in her oncologic history She had multiple cancer recurrence She has not been treated with chemotherapy for over 15 years The patient has progressed on multiple antiestrogen therapy I recommend calling pathologist to see if PD-L1 of the MMR/MSI testing can be done to see if she will qualify for pembrolizumab I do not favor following imaging study with MRI According to prior documentation, the recent CT was discontinued and switched to MRI was because her prior oncologist wants to see soft tissue disease better in 1 imaging study and then since then, she continues to have MRI only I felt that there is additional benefit of ordering CT of the chest, abdomen and pelvis for proper staging and to see if she is at imminent risk of bowel obstruction We discussed the current guidelines and conventional approach to treatment of recurrent granulosa cell carcinoma  We discussed switching to another antiestrogen therapy versus chemotherapy with single agent carboplatin only to cytoreduce the disease before putting her on maintenance treatment of another antiestrogen therapy versus bevacizumab After a lot of discussion, she is in agreement to proceed with additional testing with pathologist, CT scan of the chest, abdomen and pelvis for staging and consideration for chemotherapy in the future I will see her back next week for further follow-up

## 2019-06-08 NOTE — Assessment & Plan Note (Signed)
I reviewed the MRI which showed lymphadenopathy It is possible that her back pain is related to the retroperitoneal lymphadenopathy causing pain Observe for now I plan to order CT imaging as above

## 2019-06-08 NOTE — Progress Notes (Signed)
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 female was transferred to my care after her prior physician has left.  I reviewed the patient's records extensive and collaborated the history with the patient. Summary of her history is as follows: Oncology History Overview Note  2018 - Core biopsy for ER/PR and Foundation One testing performed. Unfortunately, no sufficient tissue for Foundation One testing. ER was 50% and PR 90%.  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     - 09/2000 Chemotherapy   6 cycles of IP cisplatin and etoposide    02/2009 Relapse/Recurrence   CT showed increased size of nodules in pelvis   2010 Surgery   Exlap with section of tumor nodules near cecum and left pelvic sidewall. Tumor: ER negative, PR positive    Treatment Plan Change   Alternated 2 week courses of Megace and Tamoxifen - ended 03/2012   03/2012 PET scan   CT - progressive disease   2013 Treatment Plan Change   Letrozole   01/2016 Imaging   MRI showed progressive disease   2017 Treatment Plan Change   Two weeks of alternating Tamoxifen 53m daily and then Megace 4104mTID   08/2016 Imaging   MRI showed progressive disease with peritoneal implants  near liver, in pelvis   01/2017 Treatment Plan Change   Lupron 11.25 q 3 months   11/2017 Imaging   Overall mixed response.   Mixed cystic/solid lesions in the left pelvis are mildly improved.   Cystic peritoneal disease, including the dominant lesion along the posterior right hepatic lobe, is mildly progressed.   Subcapsular lesion along the posterior right hepatic lobe is unchanged.   03/2018 Imaging   MRI A/P: Mixed response of individual peritoneal metastases in the pelvis, as described above. Overall, there has been no significant change in bulk of disease.   Stable cystic peritoneal metastatic disease along the capsular surfaces of the liver and spleen.   No new sites of metastatic disease identified within the abdomen or pelvis.   08/2018 Imaging   MRI A/P: Status post hysterectomy and bilateral salpingo-oophorectomy.   Mixed cystic/solid peritoneal implants in the abdomen/pelvis, as above. Dominant cystic implant along the posterior liver surface is mildly increased. Remaining lesions are overall grossly unchanged.   No new lesions are identified.   01/28/2019 Imaging   Mri A/P: 1. Relatively similar appearance of peritoneal metastasis. A posterior right hepatic capsular based lesion is similar to minimally decreased in size. Left pelvic implants are primarily similar with possible enlargement of an anterior high left pelvic cystic implant. No new disease identified. 2.  Aortic Atherosclerosis (ICD10-I70.0). 3. Hepatic steatosis. 4. Left adrenal adenoma.   06/02/2019 Imaging   MRI 1. Potential slight enlargement of dominant cystic area and solid component, associated with rind like signal variation on T2 along the inferior right hepatic  margin, also potentially slightly increased. Findings may still be within the realm of measurement and technical variation. Close attention on follow-up. 2. Subtle cystic changes along the cephalad margin of the spleen are difficult to see  on previous imaging, perhaps new compared with prior imaging studies. 3. Pelvic implants and left lower quadrant lesion with similar size, of the area along the left iliac vasculature may be slightly larger than on the prior study. 4. Signs of extensive retroperitoneal and pelvic lymphadenectomy. 5. Hepatic steatosis. 6. 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     06/08/2019 Cancer Staging   Staging form: Ovary, AJCC 7th Edition - Clinical: Stage IIIC (rT2, N1, M0) - Signed by Heath Lark, MD on 06/08/2019    She is not symptomatic despite multiple findings on MRI recently which show signs of disease progression Specifically, she denies nausea, changes in bowel habits or bloating She does have occasional back pain especially on the left flank site but she does not know whether this could be due to her arthritis She has lost some weight through intentional weight loss effort  MEDICAL HISTORY:  Past Medical History:  Diagnosis Date  . Anxiety   . Arthritis   . Cervical syndrome   . CKD (chronic kidney disease), stage III   . Constipation   . COVID-19 03/26/2019  . Fatty liver   . GERD (gastroesophageal reflux disease)   . Granulosa cell tumor of ovary   . History of hiatal hernia   . Joint pain   . Obesity   . Osteoarthritis   . Ovarian cancer ([place])   . PONV (postoperative nausea and vomiting)    history of n/v,  past surgeries no n/v  . Sleep apnea     SURGICAL HISTORY: Past Surgical History:  Procedure Laterality Date  . ABDOMINAL HYSTERECTOMY     TAH/BSO  . APPENDECTOMY    . EXPLORATORY LAPAROTOMY     for bowel obstruction  . left toe surgery Left   . Secondary tumor debulking  2002  . SHOULDER SURGERY     2017 left  . Tumor debulking  2010  . VENTRAL HERNIA REPAIR      SOCIAL HISTORY: Social History   Socioeconomic History  . Marital status: Married    Spouse name: Allan Bacigalupi  . Number of children: 2  .  Years of education: Not on file  . Highest education level: Not on file  Occupational History  . Occupation: Therapist, sports  Tobacco Use  . Smoking status: Never Smoker  . Smokeless tobacco: Never Used  Substance and Sexual Activity  . Alcohol use: Yes    Comment: One 8oz glass, socially  . Drug use: No  . Sexual activity: Not Currently  Other Topics Concern  . Not on file  Social History Narrative  . Not on file   Social Determinants of Health   Financial Resource Strain:   . Difficulty of Paying Living Expenses: Not on file  Food Insecurity:   . Worried About Charity fundraiser in the Last Year: Not on file  . Ran Out of Food in the Last Year: Not on file  Transportation Needs:   . Lack of Transportation (Medical): Not on file  . Lack of Transportation (Non-Medical): Not on file  Physical Activity:   . Days of Exercise per Week: Not on file  . Minutes of Exercise per Session: Not on file  Stress:   . Feeling  of Stress : Not on file  Social Connections:   . Frequency of Communication with Friends and Family: Not on file  . Frequency of Social Gatherings with Friends and Family: Not on file  . Attends Religious Services: Not on file  . Active Member of Clubs or Organizations: Not on file  . Attends Archivist Meetings: Not on file  . Marital Status: Not on file  Intimate Partner Violence:   . Fear of Current or Ex-Partner: Not on file  . Emotionally Abused: Not on file  . Physically Abused: Not on file  . Sexually Abused: Not on file    FAMILY HISTORY: Family History  Problem Relation Age of Onset  . Basal cell carcinoma Mother   . Thyroid disease Mother   . Stroke Father   . Colon cancer Father   . Hypertension Father   . Heart disease Father   . Colon cancer Paternal Uncle     ALLERGIES:  is allergic to codeine; oxycodone; sodium thiosalicylate; ciprofloxacin; codone [hydrocodone bitartrate]; daypro [oxaprozin]; levofloxacin; stadol [butorphanol tartrate];  and sulfa antibiotics.  MEDICATIONS:  Current Outpatient Medications  Medication Sig Dispense Refill  . Naltrexone-buPROPion HCl ER (CONTRAVE) 8-90 MG TB12 Take 2 tablets by mouth daily.    . Biotin 10 MG CAPS Take by mouth.    . calcium carbonate (CALCIUM 600) 600 MG TABS tablet Take by mouth.    . escitalopram (LEXAPRO) 20 MG tablet Take 10 mg by mouth daily.     . metFORMIN (GLUCOPHAGE) 1000 MG tablet Take 1 tablet (1,000 mg total) by mouth 2 (two) times daily with a meal. 60 tablet 0  . Multiple Vitamin (MULTIVITAMIN) capsule Take 1 capsule by mouth daily.    . naproxen (NAPROSYN) 500 MG tablet Take 500 mg by mouth 2 (two) times daily with a meal.    . omeprazole (PRILOSEC) 20 MG capsule Take 20 mg by mouth daily as needed (heartburn).     . Probiotic Product (ALIGN PO) Take 1 tablet by mouth daily.     . Vitamin D, Ergocalciferol, (DRISDOL) 1.25 MG (50000 UNIT) CAPS capsule Take 1 capsule (50,000 Units total) by mouth every 7 (seven) days. 4 capsule 0   No current facility-administered medications for this visit.    REVIEW OF SYSTEMS:   Constitutional: Denies fevers, chills or abnormal night sweats Eyes: Denies blurriness of vision, double vision or watery eyes Ears, nose, mouth, throat, and face: Denies mucositis or sore throat Respiratory: Denies cough, dyspnea or wheezes Cardiovascular: Denies palpitation, chest discomfort or lower extremity swelling Gastrointestinal:  Denies nausea, heartburn or change in bowel habits Skin: Denies abnormal skin rashes Lymphatics: Denies new lymphadenopathy or easy bruising Neurological:Denies numbness, tingling or new weaknesses Behavioral/Psych: Mood is stable, no new changes  All other systems were reviewed with the patient and are negative.  PHYSICAL EXAMINATION: ECOG PERFORMANCE STATUS: 1 - Symptomatic but completely ambulatory  Vitals:   06/07/19 1328  BP: 123/71  Pulse: (!) 52  Resp: 18  SpO2: 99%   Filed Weights   06/07/19  1328  Weight: 193 lb 4.8 oz (87.7 kg)    GENERAL:alert, no distress and comfortable SKIN: skin color, texture, turgor are normal, no rashes or significant lesions EYES: normal, conjunctiva are pink and non-injected, sclera clear OROPHARYNX:no exudate, normal lips, buccal mucosa, and tongue  NECK: supple, thyroid normal size, non-tender, without nodularity LYMPH:  no palpable lymphadenopathy in the cervical, axillary or inguinal LUNGS: clear to auscultation and percussion with normal  breathing effort HEART: regular rate & rhythm and no murmurs without lower extremity edema ABDOMEN:abdomen soft, non-tender and normal bowel sounds.  Noted well-healed surgical scars Musculoskeletal:no cyanosis of digits and no clubbing  PSYCH: alert & oriented x 3 with fluent speech NEURO: no focal motor/sensory deficits  LABORATORY DATA:  I have reviewed the data as listed Lab Results  Component Value Date   WBC 5.3 04/29/2018   HGB 14.5 04/29/2018   HCT 42.5 04/29/2018   MCV 91 04/29/2018   PLT 268 10/04/2016   Recent Labs    11/19/18 0945 01/14/19 0855 04/05/19 1200 05/26/19 1450 05/31/19 0910  NA 141   < > 139 140 141  K 4.7   < > 4.6 4.3 4.7  CL 100   < > 102 101 105  CO2 23   < > 28 27 22   GLUCOSE 82   < > 93 86 97  BUN 23   < > 23 25* 18  CREATININE 1.11*   < > 1.46* 1.11* 1.04*  CALCIUM 9.7   < > 9.4 9.5 9.8  GFRNONAA 54*   < > 38* 53* 58*  GFRAA 62   < > 44* >60 67  PROT 6.3  --   --   --  6.6  ALBUMIN 4.3  --   --   --  4.5  AST 19  --   --   --  19  ALT 15  --   --   --  19  ALKPHOS 73  --   --   --  72  BILITOT 0.2  --   --   --  0.3   < > = values in this interval not displayed.    RADIOGRAPHIC STUDIES: I have personally reviewed the radiological images as listed and agreed with the findings in the report. MR Pelvis W Wo Contrast  Result Date: 06/02/2019 CLINICAL DATA:  History of ovarian neoplasm, follow-up evaluation. History of malignant granulosa cell tumor of the  ovary. EXAM: MRI ABDOMEN AND PELVIS WITHOUT AND WITH CONTRAST TECHNIQUE: Multiplanar multisequence MR imaging of the abdomen and pelvis was performed both before and after the administration of intravenous contrast. CONTRAST:  82m GADAVIST GADOBUTROL 1 MMOL/ML IV SOLN COMPARISON:  01/28/2019 FINDINGS: COMBINED FINDINGS FOR BOTH MR ABDOMEN AND PELVIS Lower chest: Incidental imaging of the lung bases shows no effusion or signs of consolidation. Hepatobiliary: Signs of mild hepatic steatosis. Cystic implants along posterior right hemi liver, see below. No signs of biliary ductal dilation or pericholecystic inflammation. Pancreas:  Pancreas normal without ductal distension. Spleen: Spleen normal size, small cyst in the inferior aspect of the spleen. Cystic implant along the margin of the spleen, see below. Adrenals/Urinary Tract: Left adrenal adenoma measures 1.8 x 1.5 cm unchanged compared to previous imaging. Cyst with thin septations, Bosniak 2 lesion in the left kidney is unchanged. Stomach/Bowel: Signs of colonic diverticulosis. No acute bowel process. Limited assessment of bowel on MRI, not protocol for bowel evaluation. Vascular/Lymphatic: Patent abdominal vasculature. Signs of extensive retroperitoneal and pelvic lymphadenectomy. The Reproductive: Is a post hysterectomy. Signs of pelvic lymphadenectomy. Other: A cystic area along the inferior right hemi liver, dominant cystic area measuring 3.9 x 3.3 cm with a 1.8 cm solid enhancing component previously measured approximately 3.7 x 3.5 cm, the solid enhancing component may have enlarged slightly, approximately 1.4 cm in greatest dimension on the prior study. Other small cystic changes along the inferior right hepatic margin appear similar. There is also  soft tissue (image 17, series 30) measuring approximately 9 mm greatest thickness in the axial plane perhaps slightly more extensive than on the previous study though difficult to assess given orientation and  location. Cystic lesion along the inferior margin of the spleen showing similar size at approximately 13 mm. The will Subtle cystic changes along the cephalad margin of the spleen difficult to see on previous imaging, perhaps new compared to prior imaging studies (image 9, series 30) measuring 5 mm. Left lower quadrant cystic and solid area (image 10, series 3) measuring 1.8 cm similar to study of May of 2020. Along the left external iliac vessels another mixed cystic and solid area (image 10, series 3) also with similar size approximately 1.8 cm based on comparison with May of 2020, approximately 1.5 cm on the the most recent study. Left pelvic implant adjacent to pelvic sidewall (image 23, series 3) 1.9 cm, within 1 mm of previous size. Musculoskeletal: No suspicious bone lesions identified. IMPRESSION: 1. Potential slight enlargement of dominant cystic area and solid component, associated with rind like signal variation on T2 along the inferior right hepatic margin, also potentially slightly increased. Findings may still be within the realm of measurement and technical variation. Close attention on follow-up. 2. Subtle cystic changes along the cephalad margin of the spleen are difficult to see on previous imaging, perhaps new compared with prior imaging studies. 3. Pelvic implants and left lower quadrant lesion with similar size, of the area along the left iliac vasculature may be slightly larger than on the prior study. 4. Signs of extensive retroperitoneal and pelvic lymphadenectomy. 5. Hepatic steatosis. 6. 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 Electronically Signed   By: Zetta Bills M.D.   On: 06/02/2019 10:26   MR ABDOMEN WWO CONTRAST  Result Date: 06/02/2019 CLINICAL DATA:  History of ovarian neoplasm, follow-up evaluation. History of malignant granulosa cell tumor of the ovary. EXAM: MRI ABDOMEN AND PELVIS WITHOUT AND WITH CONTRAST TECHNIQUE:  Multiplanar multisequence MR imaging of the abdomen and pelvis was performed both before and after the administration of intravenous contrast. CONTRAST:  71m GADAVIST GADOBUTROL 1 MMOL/ML IV SOLN COMPARISON:  01/28/2019 FINDINGS: COMBINED FINDINGS FOR BOTH MR ABDOMEN AND PELVIS Lower chest: Incidental imaging of the lung bases shows no effusion or signs of consolidation. Hepatobiliary: Signs of mild hepatic steatosis. Cystic implants along posterior right hemi liver, see below. No signs of biliary ductal dilation or pericholecystic inflammation. Pancreas:  Pancreas normal without ductal distension. Spleen: Spleen normal size, small cyst in the inferior aspect of the spleen. Cystic implant along the margin of the spleen, see below. Adrenals/Urinary Tract: Left adrenal adenoma measures 1.8 x 1.5 cm unchanged compared to previous imaging. Cyst with thin septations, Bosniak 2 lesion in the left kidney is unchanged. Stomach/Bowel: Signs of colonic diverticulosis. No acute bowel process. Limited assessment of bowel on MRI, not protocol for bowel evaluation. Vascular/Lymphatic: Patent abdominal vasculature. Signs of extensive retroperitoneal and pelvic lymphadenectomy. The Reproductive: Is a post hysterectomy. Signs of pelvic lymphadenectomy. Other: A cystic area along the inferior right hemi liver, dominant cystic area measuring 3.9 x 3.3 cm with a 1.8 cm solid enhancing component previously measured approximately 3.7 x 3.5 cm, the solid enhancing component may have enlarged slightly, approximately 1.4 cm in greatest dimension on the prior study. Other small cystic changes along the inferior right hepatic margin appear similar. There is also soft tissue (image 17, series 30) measuring approximately 9 mm  greatest thickness in the axial plane perhaps slightly more extensive than on the previous study though difficult to assess given orientation and location. Cystic lesion along the inferior margin of the spleen showing  similar size at approximately 13 mm. The will Subtle cystic changes along the cephalad margin of the spleen difficult to see on previous imaging, perhaps new compared to prior imaging studies (image 9, series 30) measuring 5 mm. Left lower quadrant cystic and solid area (image 10, series 3) measuring 1.8 cm similar to study of May of 2020. Along the left external iliac vessels another mixed cystic and solid area (image 10, series 3) also with similar size approximately 1.8 cm based on comparison with May of 2020, approximately 1.5 cm on the the most recent study. Left pelvic implant adjacent to pelvic sidewall (image 23, series 3) 1.9 cm, within 1 mm of previous size. Musculoskeletal: No suspicious bone lesions identified. IMPRESSION: 1. Potential slight enlargement of dominant cystic area and solid component, associated with rind like signal variation on T2 along the inferior right hepatic margin, also potentially slightly increased. Findings may still be within the realm of measurement and technical variation. Close attention on follow-up. 2. Subtle cystic changes along the cephalad margin of the spleen are difficult to see on previous imaging, perhaps new compared with prior imaging studies. 3. Pelvic implants and left lower quadrant lesion with similar size, of the area along the left iliac vasculature may be slightly larger than on the prior study. 4. Signs of extensive retroperitoneal and pelvic lymphadenectomy. 5. Hepatic steatosis. 6. 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 Electronically Signed   By: Zetta Bills M.D.   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[place]) I have reviewed her chart extensively and summarized some of the findings in her oncologic history She had multiple cancer recurrence She has not been treated with chemotherapy for over 15 years The patient has progressed on multiple  antiestrogen therapy I recommend calling pathologist to see if PD-L1 of the MMR/MSI testing can be done to see if she will qualify for pembrolizumab I do not favor following imaging study with MRI According to prior documentation, the recent CT was discontinued and switched to MRI was because her prior oncologist wants to see soft tissue disease better in 1 imaging study and then since then, she continues to have MRI only I felt that there is additional benefit of ordering CT of the chest, abdomen and pelvis for proper staging and to see if she is at imminent risk of bowel obstruction We discussed the current guidelines and conventional approach to treatment of recurrent granulosa cell carcinoma  We discussed switching to another antiestrogen therapy versus chemotherapy with single agent carboplatin only to cytoreduce the disease before putting her on maintenance treatment of another antiestrogen therapy versus bevacizumab After a lot of discussion, she is in agreement to proceed with additional testing with pathologist, CT scan of the chest, abdomen and pelvis for staging and consideration for chemotherapy in the future I will see her back next week for further follow-up  Class 1 obesity with serious comorbidity and body mass index (BMI) of 31.0 to 31.9 in adult We have extensive discussion about the importance of dietary modification and weight loss, especially with her disease type that is hormone sensitive She will continue to take her weight loss medication as prescribed  Goals of care, counseling/discussion We  have brief discussions about goals of care She understood, given her recurrence of disease, surgery is not indicated and her disease is not considered curative but treatable at this point  Back pain, chronic I reviewed the MRI which showed lymphadenopathy It is possible that her back pain is related to the retroperitoneal lymphadenopathy causing pain Observe for now I plan to order  CT imaging as above   Orders Placed This Encounter  Procedures  . CT CHEST W CONTRAST    Standing Status:   Future    Standing Expiration Date:   06/06/2020    Order Specific Question:   If indicated for the ordered procedure, I authorize the administration of contrast media per Radiology protocol    Answer:   Yes    Order Specific Question:   Preferred imaging location?    Answer:   [facility]    Order Specific Question:   Radiology Contrast Protocol - do NOT remove file path    Answer:   [email or web address]  . CT ABDOMEN PELVIS W CONTRAST    Standing Status:   Future    Standing Expiration Date:   06/06/2020    Order Specific Question:   If indicated for the ordered procedure, I authorize the administration of contrast media per Radiology protocol    Answer:   Yes    Order Specific Question:   Preferred imaging location?    Answer:   [facility]    Order Specific Question:   Radiology Contrast Protocol - do NOT remove file path    Answer:   [email or web address]    All questions were answered. The patient knows to call the clinic with any problems, questions or concerns. The total time spent in the appointment was 60 minutes encounter with patients including review of chart and various tests results, discussions about plan of care and coordination of care plan   Heath Lark, MD 06/08/2019 10:59 AM

## 2019-06-08 NOTE — Assessment & Plan Note (Signed)
We have brief discussions about goals of care She understood, given her recurrence of disease, surgery is not indicated and her disease is not considered curative but treatable at this point

## 2019-06-08 NOTE — Assessment & Plan Note (Signed)
We have extensive discussion about the importance of dietary modification and weight loss, especially with her disease type that is hormone sensitive She will continue to take her weight loss medication as prescribed

## 2019-06-09 ENCOUNTER — Ambulatory Visit (HOSPITAL_COMMUNITY)
Admission: RE | Admit: 2019-06-09 | Discharge: 2019-06-09 | Disposition: A | Discharge status: Home / Self Care | Payer: 59 | Source: Ambulatory Visit | Attending: Hematology and Oncology | Admitting: Hematology and Oncology

## 2019-06-09 ENCOUNTER — Other Ambulatory Visit: Payer: Self-pay

## 2019-06-09 DIAGNOSIS — C569 Malignant neoplasm of unspecified ovary: Secondary | ICD-10-CM | POA: Insufficient documentation

## 2019-06-09 MED ORDER — IOHEXOL 300 MG/ML  SOLN
100.0000 mL | Freq: Once | INTRAMUSCULAR | Status: AC | PRN
  Administered 2019-06-09: 100 mL via INTRAVENOUS

## 2019-06-09 MED ORDER — SODIUM CHLORIDE (PF) 0.9 % IJ SOLN
INTRAMUSCULAR | Status: AC
  Filled 2019-06-09: qty 50

## 2019-06-10 ENCOUNTER — Ambulatory Visit (HOSPITAL_COMMUNITY): Payer: 59

## 2019-06-14 ENCOUNTER — Ambulatory Visit (HOSPITAL_COMMUNITY): Payer: 59

## 2019-06-15 ENCOUNTER — Other Ambulatory Visit: Payer: Self-pay

## 2019-06-15 ENCOUNTER — Inpatient Hospital Stay (HOSPITAL_BASED_OUTPATIENT_CLINIC_OR_DEPARTMENT_OTHER): Payer: 59 | Admitting: Hematology and Oncology

## 2019-06-15 ENCOUNTER — Encounter: Payer: Self-pay | Admitting: Hematology and Oncology

## 2019-06-15 DIAGNOSIS — Z171 Estrogen receptor negative status [ER-]: Secondary | ICD-10-CM | POA: Diagnosis not present

## 2019-06-15 DIAGNOSIS — M549 Dorsalgia, unspecified: Secondary | ICD-10-CM

## 2019-06-15 DIAGNOSIS — Z9221 Personal history of antineoplastic chemotherapy: Secondary | ICD-10-CM | POA: Diagnosis not present

## 2019-06-15 DIAGNOSIS — Z7189 Other specified counseling: Secondary | ICD-10-CM | POA: Diagnosis not present

## 2019-06-15 DIAGNOSIS — G8929 Other chronic pain: Secondary | ICD-10-CM | POA: Diagnosis not present

## 2019-06-15 DIAGNOSIS — C786 Secondary malignant neoplasm of retroperitoneum and peritoneum: Secondary | ICD-10-CM | POA: Diagnosis not present

## 2019-06-15 DIAGNOSIS — C569 Malignant neoplasm of unspecified ovary: Secondary | ICD-10-CM | POA: Diagnosis not present

## 2019-06-15 DIAGNOSIS — C772 Secondary and unspecified malignant neoplasm of intra-abdominal lymph nodes: Secondary | ICD-10-CM | POA: Diagnosis not present

## 2019-06-15 DIAGNOSIS — Z90722 Acquired absence of ovaries, bilateral: Secondary | ICD-10-CM | POA: Diagnosis not present

## 2019-06-15 DIAGNOSIS — N183 Chronic kidney disease, stage 3 unspecified: Secondary | ICD-10-CM | POA: Insufficient documentation

## 2019-06-15 DIAGNOSIS — F419 Anxiety disorder, unspecified: Secondary | ICD-10-CM | POA: Diagnosis not present

## 2019-06-15 DIAGNOSIS — Z90710 Acquired absence of both cervix and uterus: Secondary | ICD-10-CM | POA: Diagnosis not present

## 2019-06-15 DIAGNOSIS — Z79818 Long term (current) use of other agents affecting estrogen receptors and estrogen levels: Secondary | ICD-10-CM | POA: Diagnosis not present

## 2019-06-15 MED ORDER — PROCHLORPERAZINE MALEATE 10 MG PO TABS: 10.0000 mg | ORAL_TABLET | Freq: Four times a day (QID) | ORAL | 1 refills | Status: DC | PRN

## 2019-06-15 MED ORDER — ONDANSETRON HCL 8 MG PO TABS: 8.0000 mg | ORAL_TABLET | Freq: Three times a day (TID) | ORAL | 1 refills | Status: DC | PRN

## 2019-06-15 MED FILL — PROCHLORPERAZINE 10 MG TAB: 10 | 8 days supply | Qty: 30 | Fill #0

## 2019-06-15 MED FILL — ONDANSETRON HCL 8 MG TABLET: 8 | 10 days supply | Qty: 30 | Fill #0

## 2019-06-15 NOTE — Progress Notes (Signed)
START ON PATHWAY REGIMEN - Ovarian     A cycle is every 21 days:     Carboplatin   **Always confirm dose/schedule in your pharmacy ordering system**  Administration Notes: AUC of 5 only  Patient Characteristics: Recurrent or Progressive Disease, Second Line, Platinum Sensitive and ? 6 Months Since Last Therapy, Not a Candidate for Secondary Debulking Surgery Therapeutic Status: Recurrent or Progressive Disease BRCA Mutation Status: Absent Line of Therapy: Second Line  Intent of Therapy: Non-Curative / Palliative Intent, Discussed with Patient

## 2019-06-15 NOTE — Assessment & Plan Note (Signed)
We discussed goals of care discussion briefly She understood the goals of care is palliative intent

## 2019-06-15 NOTE — Assessment & Plan Note (Signed)
I have reviewed multiple imaging studies with the patient Currently, she is relatively asymptomatic although she have intermittent back pain which I cannot explain based on imaging findings We discussed the current guidelines We discussed the risks, benefits, side effects of systemic treatment versus further antiestrogen therapy Ultimately, she agreed to try single agent carboplatin for cytoreduction of disease burden I recommend minimum 4 cycles of treatment before repeat CT imaging for assessment of response to therapy If she have excellent response to treatment, we could potentially go on a few more cycles or switch her to maintenance bevacizumab  We discussed the role of chemotherapy. The intent is of palliative intent.  We discussed some of the risks, benefits, side-effects of carboplatin  Some of the short term side-effects included, though not limited to, including weight loss, life threatening infections, risk of allergic reactions, need for transfusions of blood products, nausea, vomiting, change in bowel habits, loss of hair, admission to hospital for various reasons, and risks of death.   Long term side-effects are also discussed including risks of infertility, permanent damage to nerve function, hearing loss, chronic fatigue, kidney damage with possibility needing hemodialysis, and rare secondary malignancy including bone marrow disorders.  The patient is aware that the response rates discussed earlier is not guaranteed.  After a long discussion, patient made an informed decision to proceed with the prescribed plan of care.   Patient education material was dispensed. I will prescribe carboplatin at AUC of 5 She has reasonable venous access.  I do not believe she would need port placement She had received chemotherapy before.  She would not need chemo education class I will prescribe antiemetics as needed I will adjust her kidney function carefully We will start her first dose of  treatment next week I will see her prior to cycle 2 of therapy

## 2019-06-15 NOTE — Assessment & Plan Note (Signed)
She has stable chronic kidney disease stage III We will adjust the dose of carboplatin accordingly

## 2019-06-15 NOTE — Progress Notes (Signed)
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[place]) I have reviewed multiple imaging studies with the patient Currently, she is relatively asymptomatic although she have intermittent back pain which I cannot explain based on imaging findings We discussed the current guidelines We discussed the risks, benefits, side effects of systemic treatment versus further antiestrogen therapy Ultimately, she agreed to try single agent carboplatin for cytoreduction of disease burden I recommend minimum 4 cycles of treatment before repeat CT imaging for assessment of response to therapy If she have excellent response to treatment, we could potentially go on a few more cycles or switch her to maintenance bevacizumab  We discussed the role of chemotherapy. The intent is of palliative intent.  We discussed some of the risks, benefits, side-effects of carboplatin  Some of the short term side-effects included, though not limited to, including weight loss, life threatening infections, risk of allergic reactions, need for transfusions of blood products, nausea, vomiting, change in bowel habits, loss of hair, admission to hospital for various reasons, and risks of death.   Long term side-effects are also discussed including risks of infertility, permanent damage to nerve function, hearing loss, chronic fatigue, kidney damage with possibility needing hemodialysis, and rare secondary malignancy including bone marrow disorders.  The patient is aware that the response rates discussed earlier is not guaranteed.  After a long discussion, patient made an informed decision to proceed with the prescribed plan of care.   Patient education material was dispensed. I will prescribe carboplatin at AUC of 5 She has reasonable venous access.  I do not believe she would need port  placement She had received chemotherapy before.  She would not need chemo education class I will prescribe antiemetics as needed I will adjust her kidney function carefully We will start her first dose of treatment next week I will see her prior to cycle 2 of therapy    Back pain, chronic Based on recent imaging study, I do not see any bone lesions We will continue conservative approach  CKD (chronic kidney disease), stage III She has stable chronic kidney disease stage III We will adjust the dose of carboplatin accordingly  Goals of care, counseling/discussion We discussed goals of care discussion briefly She understood the goals of care is palliative intent   Orders Placed This Encounter  Procedures  . CBC with Differential (Cancer Center Only)    Standing Status:   Standing    Number of Occurrences:   20    Standing Expiration Date:   06/14/2020  . Comprehensive metabolic panel    Standing Status:   Standing    Number of Occurrences:   22    Standing Expiration Date:   06/14/2020  . Inhibin B    Standing Status:   Standing    Number of Occurrences:   9    Standing Expiration Date:   06/14/2020    All questions were answered. The patient knows to call the clinic with any problems, questions or concerns. The total time spent in the appointment was 30 minutes encounter with patients including review of chart and various tests results, discussions about plan of care and coordination of care plan   Heath Lark, MD 06/15/2019 9:22 AM  INTERVAL HISTORY: Please see below for problem oriented charting. She returns to review test results Since last time I saw her, she has occasional  back pain that comes and goes Otherwise, she denies abdominal pain, nausea or changes in bowel habits  SUMMARY OF ONCOLOGIC HISTORY: Oncology History Overview Note  2018 - Core biopsy for ER/PR and Foundation One testing performed. Unfortunately, no sufficient tissue for Foundation One testing. ER  was 50% and PR 90%.  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     - 09/2000 Chemotherapy   6 cycles of IP cisplatin and etoposide    02/2009 Relapse/Recurrence   CT showed increased size of nodules in pelvis   2010 Surgery   Exlap with section of tumor nodules near cecum and left pelvic sidewall. Tumor: ER negative, PR positive    Treatment Plan Change   Alternated 2 week courses of Megace and Tamoxifen - ended 03/2012   03/2012 PET scan   CT - progressive disease   2013 Treatment Plan Change   Letrozole   01/2016 Imaging   MRI showed progressive disease   2017 Treatment Plan Change   Two weeks of alternating Tamoxifen 20mg  daily and then Megace 40mg  TID   08/2016 Imaging   MRI showed progressive disease with peritoneal implants near liver, in pelvis   01/2017 Treatment Plan Change   Lupron 11.25 q 3 months   11/2017 Imaging   Overall mixed response.   Mixed cystic/solid lesions in the left pelvis are mildly improved.   Cystic peritoneal disease, including the dominant lesion along the posterior right hepatic lobe, is mildly progressed.   Subcapsular lesion along the posterior right hepatic lobe is unchanged.   03/2018 Imaging   MRI A/P: Mixed response of individual peritoneal metastases in the pelvis, as described above. Overall, there has been no significant change in bulk of disease.   Stable cystic peritoneal metastatic disease along the capsular surfaces of the liver  and spleen.   No new sites of metastatic disease identified within the abdomen or pelvis.   08/2018 Imaging   MRI A/P: Status post hysterectomy and bilateral salpingo-oophorectomy.   Mixed cystic/solid peritoneal implants in the abdomen/pelvis, as above. Dominant cystic implant along the posterior liver surface is mildly increased. Remaining lesions are overall grossly unchanged.   No new lesions are identified.   01/28/2019 Imaging   Mri A/P: 1. Relatively similar appearance of peritoneal metastasis. A posterior right hepatic capsular based lesion is similar to minimally decreased in size. Left pelvic implants are primarily similar with possible enlargement of an anterior high left pelvic cystic implant. No new disease identified. 2.  Aortic Atherosclerosis (ICD10-I70.0). 3. Hepatic steatosis. 4. Left adrenal adenoma.   06/02/2019 Imaging   MRI 1. Potential slight enlargement of dominant cystic area and solid component, associated with rind like signal variation on T2 along the inferior right hepatic margin, also potentially slightly increased. Findings may still be within the realm of measurement and technical variation. Close attention on follow-up. 2. Subtle cystic changes along the cephalad margin of the spleen are difficult to see on previous imaging, perhaps new compared with prior imaging studies. 3. Pelvic implants and left lower quadrant lesion with similar  size, of the area along the left iliac vasculature may be slightly larger than on the prior study. 4. Signs of extensive retroperitoneal and pelvic lymphadenectomy. 5. Hepatic steatosis. 6. 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     06/08/2019 Cancer Staging   Staging form: [place], AJCC 7th Edition - Clinical: Stage IIIC (rT2, N1, M0) - Signed by Heath Lark, MD on 06/08/2019   06/10/2019 Imaging   1. Multiple redemonstrated partially solid metastatic implants in the  hepatorenal recess, left paracolic gutter, left pelvis, and likely the tip of the spleen as detailed above and as seen on recent prior MRI dated 06/02/2019. These findings are slightly worsened in comparison to a remote prior CT examination dated 10/04/2014.   2.  No evidence of metastatic disease in the chest.   3. Status post hysterectomy, pelvic and retroperitoneal lymph node dissection, and ventral hernia mesh repair.   4.  Hepatic steatosis.   5.  Aortic Atherosclerosis (ICD10-I70.0).   06/24/2019 -  Chemotherapy   The patient had palonosetron (ALOXI) injection 0.25 mg, 0.25 mg, Intravenous,  Once, 0 of 6 cycles CARBOplatin (PARAPLATIN) 510 mg in sodium chloride 0.9 % 250 mL chemo infusion, 510 mg (100 % of original dose 513.5 mg), Intravenous,  Once, 0 of 6 cycles Dose modification:   (original dose 513.5 mg, Cycle 1) fosaprepitant (EMEND) 150 mg in sodium chloride 0.9 % 145 mL IVPB, 150 mg, Intravenous,  Once, 0 of 6 cycles  for chemotherapy treatment.      REVIEW OF SYSTEMS:   Constitutional: Denies fevers, chills or abnormal weight loss Eyes: Denies blurriness of vision Ears, nose, mouth, throat, and face: Denies mucositis or sore throat Respiratory: Denies cough, dyspnea or wheezes Cardiovascular: Denies palpitation, chest discomfort or lower extremity swelling Gastrointestinal:  Denies nausea, heartburn or change in bowel habits Skin: Denies abnormal skin rashes Lymphatics: Denies new lymphadenopathy or easy bruising Neurological:Denies numbness, tingling or new weaknesses Behavioral/Psych: Mood is stable, no new changes  All other systems were reviewed with the patient and are negative.  I have reviewed the past medical history, past surgical history, social history and family history with the patient and they are unchanged from previous note.  ALLERGIES:  is allergic to codeine; oxycodone; sodium thiosalicylate; ciprofloxacin; codone [hydrocodone bitartrate]; daypro  [oxaprozin]; levofloxacin; stadol [butorphanol tartrate]; and sulfa antibiotics.  MEDICATIONS:  Current Outpatient Medications  Medication Sig Dispense Refill  . Biotin 10 MG CAPS Take by mouth.    . calcium carbonate (CALCIUM 600) 600 MG TABS tablet Take by mouth.    . escitalopram (LEXAPRO) 20 MG tablet Take 10 mg by mouth daily.     . metFORMIN (GLUCOPHAGE) 1000 MG tablet Take 1 tablet (1,000 mg total) by mouth 2 (two) times daily with a meal. 60 tablet 0  . Multiple Vitamin (MULTIVITAMIN) capsule Take 1 capsule by mouth daily.    . Naltrexone-buPROPion HCl ER (CONTRAVE) 8-90 MG TB12 Take 2 tablets by mouth daily.    . naproxen (NAPROSYN) 500 MG tablet Take 500 mg by mouth 2 (two) times daily with a meal.    . omeprazole (PRILOSEC) 20 MG capsule Take 20 mg by mouth daily as needed (heartburn).     . ondansetron (ZOFRAN) 8 MG tablet Take 1 tablet (8 mg total) by mouth every 8 (eight) hours as needed for refractory nausea / vomiting. Start on day 3 after carboplatin chemo. 30 tablet 1  . Probiotic Product (ALIGN PO)  Take 1 tablet by mouth daily.     . prochlorperazine (COMPAZINE) 10 MG tablet Take 1 tablet (10 mg total) by mouth every 6 (six) hours as needed (Nausea or vomiting). 30 tablet 1  . Vitamin D, Ergocalciferol, (DRISDOL) 1.25 MG (50000 UNIT) CAPS capsule Take 1 capsule (50,000 Units total) by mouth every 7 (seven) days. 4 capsule 0   No current facility-administered medications for this visit.    PHYSICAL EXAMINATION: ECOG PERFORMANCE STATUS: 1 - Symptomatic but completely ambulatory GENERAL:alert, no distress and comfortable NEURO: alert & oriented x 3 with fluent speech, no focal motor/sensory deficits  LABORATORY DATA:  I have reviewed the data as listed    Component Value Date/Time   NA 141 05/31/2019 0910   NA 141 09/24/2013 1131   K 4.7 05/31/2019 0910   K 4.5 09/24/2013 1131   CL 105 05/31/2019 0910   CO2 22 05/31/2019 0910   CO2 24 09/24/2013 1131   GLUCOSE 97  05/31/2019 0910   GLUCOSE 86 05/26/2019 1450   GLUCOSE 90 09/24/2013 1131   BUN 18 05/31/2019 0910   BUN 21.2 09/24/2013 1131   CREATININE 1.04 (H) 05/31/2019 0910   CREATININE 1.18 (H) 09/02/2018 0917   CREATININE 1.1 09/24/2013 1131   CALCIUM 9.8 05/31/2019 0910   CALCIUM 9.5 09/24/2013 1131   PROT 6.6 05/31/2019 0910   PROT 6.8 09/24/2013 1131   ALBUMIN 4.5 05/31/2019 0910   ALBUMIN 3.9 09/24/2013 1131   AST 19 05/31/2019 0910   AST 23 09/24/2013 1131   ALT 19 05/31/2019 0910   ALT 27 09/24/2013 1131   ALKPHOS 72 05/31/2019 0910   ALKPHOS 81 09/24/2013 1131   BILITOT 0.3 05/31/2019 0910   BILITOT 0.53 09/24/2013 1131   GFRNONAA 58 (L) 05/31/2019 0910   GFRNONAA 50 (L) 09/02/2018 0917   GFRAA 67 05/31/2019 0910   GFRAA 58 (L) 09/02/2018 0917    No results found for: SPEP, UPEP  Lab Results  Component Value Date   WBC 5.3 04/29/2018   NEUTROABS 3.2 04/29/2018   HGB 14.5 04/29/2018   HCT 42.5 04/29/2018   MCV 91 04/29/2018   PLT 268 10/04/2016      Chemistry      Component Value Date/Time   NA 141 05/31/2019 0910   NA 141 09/24/2013 1131   K 4.7 05/31/2019 0910   K 4.5 09/24/2013 1131   CL 105 05/31/2019 0910   CO2 22 05/31/2019 0910   CO2 24 09/24/2013 1131   BUN 18 05/31/2019 0910   BUN 21.2 09/24/2013 1131   CREATININE 1.04 (H) 05/31/2019 0910   CREATININE 1.18 (H) 09/02/2018 0917   CREATININE 1.1 09/24/2013 1131      Component Value Date/Time   CALCIUM 9.8 05/31/2019 0910   CALCIUM 9.5 09/24/2013 1131   ALKPHOS 72 05/31/2019 0910   ALKPHOS 81 09/24/2013 1131   AST 19 05/31/2019 0910   AST 23 09/24/2013 1131   ALT 19 05/31/2019 0910   ALT 27 09/24/2013 1131   BILITOT 0.3 05/31/2019 0910   BILITOT 0.53 09/24/2013 1131       RADIOGRAPHIC STUDIES: I have reviewed multiple imaging studies with the patient I have personally reviewed the radiological images as listed and agreed with the findings in the report. CT CHEST W CONTRAST  Result Date:  06/10/2019 CLINICAL DATA:  Ovarian cancer staging EXAM: CT CHEST, ABDOMEN, AND PELVIS WITH CONTRAST TECHNIQUE: Multidetector CT imaging of the chest, abdomen and pelvis was performed following the standard protocol  during bolus administration of intravenous contrast. CONTRAST:  158mL OMNIPAQUE IOHEXOL 300 MG/ML SOLN, additional oral enteric contrast COMPARISON:  MR abdomen pelvis, 06/02/2019, 01/28/2019, CT abdomen pelvis, 10/04/2014 FINDINGS: CT CHEST FINDINGS Cardiovascular: Aortic atherosclerosis. Normal heart size. No pericardial effusion. Mediastinum/Nodes: No enlarged mediastinal, hilar, or axillary lymph nodes. Thyroid gland, trachea, and esophagus demonstrate no significant findings. Lungs/Pleura: Lungs are clear. No pleural effusion or pneumothorax. Musculoskeletal: No chest wall mass or suspicious bone lesions identified. CT ABDOMEN PELVIS FINDINGS Hepatobiliary: No solid liver abnormality is seen. Hepatic steatosis. No gallstones, gallbladder wall thickening, or biliary dilatation. Pancreas: Unremarkable. No pancreatic ductal dilatation or surrounding inflammatory changes. Spleen: Normal in size without significant parenchymal abnormality. Probable small implant about the tip of the spleen measuring 1.0 cm (series 2, image 59) Adrenals/Urinary Tract: Stable, benign left adrenal adenoma. Kidneys are normal, without renal calculi, solid lesion, or hydronephrosis. Bladder is unremarkable. Stomach/Bowel: Stomach is within normal limits. Colonic diverticulosis. No evidence of bowel wall thickening, distention, or inflammatory changes. Vascular/Lymphatic: Aortic atherosclerosis. Unchanged enlarged left iliac lymph node or soft tissue nodule, measuring 1.7 by 1.4 cm (series 2, image 99). No other enlarged abdominal or pelvic lymph nodes. Postoperative findings of extensive retroperitoneal, left iliac, and left pelvic sidewall lymph node resection Reproductive: Status post hysterectomy. Mixed solid and cystic  nodule in the left pelvic sidewall measuring 1.8 x 1.4 cm (series 2, image 111). Other: Ventral hernia mesh. No abdominopelvic ascites. Unchanged partially solid nodularity in the hepatorenal recess, a dominant nodule measuring 1.1 cm (series 2, image 60). There is a large predominantly cystic nodule versus exophytic renal cyst in this vicinity measuring 4.0 cm (series 2, image 61). Unchanged partially solid nodule in the left paracolic gutter, measuring 1.6 cm (series 2, image 100). Musculoskeletal: No acute or significant osseous findings. IMPRESSION: 1. 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 These findings are slightly worsened in comparison to a remote prior CT examination dated 10/04/2014. 2.  No evidence of metastatic disease in the chest. 3. Status post hysterectomy, pelvic and retroperitoneal lymph node dissection, and ventral hernia mesh repair. 4.  Hepatic steatosis. 5.  Aortic Atherosclerosis (ICD10-I70.0). Electronically Signed   By: Eddie Candle M.D.   On: 06/10/2019 10:40   MR Pelvis W Wo Contrast  Result Date: 06/02/2019 CLINICAL DATA:  History of ovarian neoplasm, follow-up evaluation. History of malignant granulosa cell tumor of the ovary. EXAM: MRI ABDOMEN AND PELVIS WITHOUT AND WITH CONTRAST TECHNIQUE: Multiplanar multisequence MR imaging of the abdomen and pelvis was performed both before and after the administration of intravenous contrast. CONTRAST:  24mL GADAVIST GADOBUTROL 1 MMOL/ML IV SOLN COMPARISON:  01/28/2019 FINDINGS: COMBINED FINDINGS FOR BOTH MR ABDOMEN AND PELVIS Lower chest: Incidental imaging of the lung bases shows no effusion or signs of consolidation. Hepatobiliary: Signs of mild hepatic steatosis. Cystic implants along posterior right hemi liver, see below. No signs of biliary ductal dilation  or pericholecystic inflammation. Pancreas:  Pancreas normal without ductal distension. Spleen: Spleen normal size, small cyst in the inferior aspect of the spleen. Cystic implant along the margin of the spleen, see below. Adrenals/Urinary Tract: Left adrenal adenoma measures 1.8 x 1.5 cm unchanged compared to previous imaging. Cyst with thin septations, Bosniak 2 lesion in the left kidney is unchanged. Stomach/Bowel: Signs of colonic diverticulosis. No acute bowel process. Limited assessment of bowel on  MRI, not protocol for bowel evaluation. Vascular/Lymphatic: Patent abdominal vasculature. Signs of extensive retroperitoneal and pelvic lymphadenectomy. The Reproductive: Is a post hysterectomy. Signs of pelvic lymphadenectomy. Other: A cystic area along the inferior right hemi liver, dominant cystic area measuring 3.9 x 3.3 cm with a 1.8 cm solid enhancing component previously measured approximately 3.7 x 3.5 cm, the solid enhancing component may have enlarged slightly, approximately 1.4 cm in greatest dimension on the prior study. Other small cystic changes along the inferior right hepatic margin appear similar. There is also soft tissue (image 17, series 30) measuring approximately 9 mm greatest thickness in the axial plane perhaps slightly more extensive than on the previous study though difficult to assess given orientation and location. Cystic lesion along the inferior margin of the spleen showing similar size at approximately 13 mm. The will Subtle cystic changes along the cephalad margin of the spleen difficult to see on previous imaging, perhaps new compared to prior imaging studies (image 9, series 30) measuring 5 mm. Left lower quadrant cystic and solid area (image 10, series 3) measuring 1.8 cm similar to study of May of 2020. Along the left external iliac vessels another mixed cystic and solid area (image 10, series 3) also with similar size approximately 1.8 cm based on comparison with May of 2020,  approximately 1.5 cm on the the most recent study. Left pelvic implant adjacent to pelvic sidewall (image 23, series 3) 1.9 cm, within 1 mm of previous size. Musculoskeletal: No suspicious bone lesions identified. IMPRESSION: 1. Potential slight enlargement of dominant cystic area and solid component, associated with rind like signal variation on T2 along the inferior right hepatic margin, also potentially slightly increased. Findings may still be within the realm of measurement and technical variation. Close attention on follow-up. 2. Subtle cystic changes along the cephalad margin of the spleen are difficult to see on previous imaging, perhaps new compared with prior imaging studies. 3. Pelvic implants and left lower quadrant lesion with similar size, of the area along the left iliac vasculature may be slightly larger than on the prior study. 4. Signs of extensive retroperitoneal and pelvic lymphadenectomy. 5. Hepatic steatosis. 6. 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 Electronically Signed   By: Zetta Bills M.D.   On: 06/02/2019 10:26   MR ABDOMEN WWO CONTRAST  Result Date: 06/02/2019 CLINICAL DATA:  History of ovarian neoplasm, follow-up evaluation. History of malignant granulosa cell tumor of the ovary. EXAM: MRI ABDOMEN AND PELVIS WITHOUT AND WITH CONTRAST TECHNIQUE: Multiplanar multisequence MR imaging of the abdomen and pelvis was performed both before and after the administration of intravenous contrast. CONTRAST:  44mL GADAVIST GADOBUTROL 1 MMOL/ML IV SOLN COMPARISON:  01/28/2019 FINDINGS: COMBINED FINDINGS FOR BOTH MR ABDOMEN AND PELVIS Lower chest: Incidental imaging of the lung bases shows no effusion or signs of consolidation. Hepatobiliary: Signs of mild hepatic steatosis. Cystic implants along posterior right hemi liver, see below. No signs of biliary ductal dilation or pericholecystic inflammation. Pancreas:  Pancreas normal without ductal  distension. Spleen: Spleen normal size, small cyst in the inferior aspect of the spleen. Cystic implant along the margin of the spleen, see below. Adrenals/Urinary Tract: Left adrenal adenoma measures 1.8 x 1.5 cm unchanged compared to previous imaging. Cyst with thin septations, Bosniak 2 lesion in the left kidney is unchanged. Stomach/Bowel: Signs of colonic diverticulosis. No acute bowel process. Limited assessment of bowel on MRI, not protocol for bowel evaluation. Vascular/Lymphatic: Patent abdominal vasculature.  Signs of extensive retroperitoneal and pelvic lymphadenectomy. The Reproductive: Is a post hysterectomy. Signs of pelvic lymphadenectomy. Other: A cystic area along the inferior right hemi liver, dominant cystic area measuring 3.9 x 3.3 cm with a 1.8 cm solid enhancing component previously measured approximately 3.7 x 3.5 cm, the solid enhancing component may have enlarged slightly, approximately 1.4 cm in greatest dimension on the prior study. Other small cystic changes along the inferior right hepatic margin appear similar. There is also soft tissue (image 17, series 30) measuring approximately 9 mm greatest thickness in the axial plane perhaps slightly more extensive than on the previous study though difficult to assess given orientation and location. Cystic lesion along the inferior margin of the spleen showing similar size at approximately 13 mm. The will Subtle cystic changes along the cephalad margin of the spleen difficult to see on previous imaging, perhaps new compared to prior imaging studies (image 9, series 30) measuring 5 mm. Left lower quadrant cystic and solid area (image 10, series 3) measuring 1.8 cm similar to study of May of 2020. Along the left external iliac vessels another mixed cystic and solid area (image 10, series 3) also with similar size approximately 1.8 cm based on comparison with May of 2020, approximately 1.5 cm on the the most recent study. Left pelvic implant adjacent  to pelvic sidewall (image 23, series 3) 1.9 cm, within 1 mm of previous size. Musculoskeletal: No suspicious bone lesions identified. IMPRESSION: 1. Potential slight enlargement of dominant cystic area and solid component, associated with rind like signal variation on T2 along the inferior right hepatic margin, also potentially slightly increased. Findings may still be within the realm of measurement and technical variation. Close attention on follow-up. 2. Subtle cystic changes along the cephalad margin of the spleen are difficult to see on previous imaging, perhaps new compared with prior imaging studies. 3. Pelvic implants and left lower quadrant lesion with similar size, of the area along the left iliac vasculature may be slightly larger than on the prior study. 4. Signs of extensive retroperitoneal and pelvic lymphadenectomy. 5. Hepatic steatosis. 6. 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 Electronically Signed   By: Zetta Bills M.D.   On: 06/02/2019 10:26   CT ABDOMEN PELVIS W CONTRAST  Result Date: 06/10/2019 CLINICAL DATA:  Ovarian cancer staging EXAM: CT CHEST, ABDOMEN, AND PELVIS WITH CONTRAST TECHNIQUE: Multidetector CT imaging of the chest, abdomen and pelvis was performed following the standard protocol during bolus administration of intravenous contrast. CONTRAST:  19mL OMNIPAQUE IOHEXOL 300 MG/ML SOLN, additional oral enteric contrast COMPARISON:  MR abdomen pelvis, 06/02/2019, 01/28/2019, CT abdomen pelvis, 10/04/2014 FINDINGS: CT CHEST FINDINGS Cardiovascular: Aortic atherosclerosis. Normal heart size. No pericardial effusion. Mediastinum/Nodes: No enlarged mediastinal, hilar, or axillary lymph nodes. Thyroid gland, trachea, and esophagus demonstrate no significant findings. Lungs/Pleura: Lungs are clear. No pleural effusion or pneumothorax. Musculoskeletal: No chest wall mass or suspicious bone lesions identified. CT ABDOMEN PELVIS FINDINGS  Hepatobiliary: No solid liver abnormality is seen. Hepatic steatosis. No gallstones, gallbladder wall thickening, or biliary dilatation. Pancreas: Unremarkable. No pancreatic ductal dilatation or surrounding inflammatory changes. Spleen: Normal in size without significant parenchymal abnormality. Probable small implant about the tip of the spleen measuring 1.0 cm (series 2, image 59) Adrenals/Urinary Tract: Stable, benign left adrenal adenoma. Kidneys are normal, without renal calculi, solid lesion, or hydronephrosis. Bladder is unremarkable. Stomach/Bowel: Stomach is within normal limits. Colonic diverticulosis. No evidence of bowel wall thickening, distention,  or inflammatory changes. Vascular/Lymphatic: Aortic atherosclerosis. Unchanged enlarged left iliac lymph node or soft tissue nodule, measuring 1.7 by 1.4 cm (series 2, image 99). No other enlarged abdominal or pelvic lymph nodes. Postoperative findings of extensive retroperitoneal, left iliac, and left pelvic sidewall lymph node resection Reproductive: Status post hysterectomy. Mixed solid and cystic nodule in the left pelvic sidewall measuring 1.8 x 1.4 cm (series 2, image 111). Other: Ventral hernia mesh. No abdominopelvic ascites. Unchanged partially solid nodularity in the hepatorenal recess, a dominant nodule measuring 1.1 cm (series 2, image 60). There is a large predominantly cystic nodule versus exophytic renal cyst in this vicinity measuring 4.0 cm (series 2, image 61). Unchanged partially solid nodule in the left paracolic gutter, measuring 1.6 cm (series 2, image 100). Musculoskeletal: No acute or significant osseous findings. IMPRESSION: 1. 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 These findings are slightly worsened in comparison to a remote prior CT examination  dated 10/04/2014. 2.  No evidence of metastatic disease in the chest. 3. Status post hysterectomy, pelvic and retroperitoneal lymph node dissection, and ventral hernia mesh repair. 4.  Hepatic steatosis. 5.  Aortic Atherosclerosis (ICD10-I70.0). Electronically Signed   By: Eddie Candle M.D.   On: 06/10/2019 10:40

## 2019-06-15 NOTE — Assessment & Plan Note (Signed)
Based on recent imaging study, I do not see any bone lesions We will continue conservative approach

## 2019-06-16 ENCOUNTER — Encounter (INDEPENDENT_AMBULATORY_CARE_PROVIDER_SITE_OTHER): Payer: Self-pay | Admitting: Physician Assistant

## 2019-06-16 ENCOUNTER — Telehealth: Payer: Self-pay | Admitting: Hematology and Oncology

## 2019-06-16 ENCOUNTER — Ambulatory Visit (INDEPENDENT_AMBULATORY_CARE_PROVIDER_SITE_OTHER): Payer: 59 | Admitting: Physician Assistant

## 2019-06-16 VITALS — BP 125/75 | HR 51 | Temp 97.4°F | Ht 66.0 in | Wt 195.0 lb

## 2019-06-16 DIAGNOSIS — E559 Vitamin D deficiency, unspecified: Secondary | ICD-10-CM

## 2019-06-16 DIAGNOSIS — E669 Obesity, unspecified: Secondary | ICD-10-CM

## 2019-06-16 DIAGNOSIS — Z6831 Body mass index (BMI) 31.0-31.9, adult: Secondary | ICD-10-CM

## 2019-06-16 NOTE — Progress Notes (Signed)
Chief Complaint:   OBESITY [name] is here to discuss her progress with her obesity treatment plan along with follow-up of her obesity related diagnoses. [name] is on the Category 2 Plan and states she is following her eating plan approximately 80% of the time. [name] states she is doing 0 minutes 0 times per week.  Today's visit was #: 21 Starting weight: 209 lbs Starting date: 04/29/2018 Today's weight: 195 lbs Today's date: 06/16/2019 Total lbs lost to date: 14 Total lbs lost since last in-office visit: 0  Interim History: [name] states that she has been drinking protein shakes with bananas 3-4 days a week. She is not eating enough protein at lunch. She is starting chemo soon for ovarian cancer.  Subjective:   1. Vitamin D deficiency [name] is on Vit D weekly. Last Vit D level was at goal. She denies nausea, vomiting, or muscle weakness.  Assessment/Plan:   1. Vitamin D deficiency Low Vitamin D level contributes to fatigue and are associated with obesity, breast, and colon cancer. [name] agreed to continue taking prescription Vitamin D 50,000 IU every week and will follow-up for routine testing of Vitamin D, at least 2-3 times per year to avoid over-replacement.  2. Class 1 obesity with serious comorbidity and body mass index (BMI) of 31.0 to 31.9 in adult, unspecified obesity type [name] is currently in the action stage of change. As such, her goal is to continue with weight loss efforts. She has agreed to the [employer].   Exercise goals: No exercise has been prescribed at this time.  Behavioral modification strategies: increasing lean protein intake, decreasing simple carbohydrates and no skipping meals.  [name] has agreed to follow-up with our clinic in 3 weeks. She was informed of the importance of frequent follow-up visits to maximize her success with intensive lifestyle modifications for her multiple health conditions.   Objective:   Blood pressure 125/75, pulse (!)  51, temperature (!) 97.4 F (36.3 C), temperature source Oral, height 5\' 6"  (1.676 m), weight 195 lb (88.5 kg), SpO2 99 %. Body mass index is 31.47 kg/m.  General: Cooperative, alert, well developed, in no acute distress. HEENT: Conjunctivae and lids unremarkable. Cardiovascular: Regular rhythm.  Lungs: Normal work of breathing. Neurologic: No focal deficits.   Lab Results  Component Value Date   CREATININE 1.04 (H) 05/31/2019   BUN 18 05/31/2019   NA 141 05/31/2019   K 4.7 05/31/2019   CL 105 05/31/2019   CO2 22 05/31/2019   Lab Results  Component Value Date   ALT 19 05/31/2019   AST 19 05/31/2019   ALKPHOS 72 05/31/2019   BILITOT 0.3 05/31/2019   Lab Results  Component Value Date   HGBA1C 5.3 05/31/2019   HGBA1C 5.2 11/19/2018   HGBA1C 5.5 04/29/2018   Lab Results  Component Value Date   INSULIN 16.5 05/31/2019   INSULIN 17.6 11/19/2018   INSULIN 25.8 (H) 04/29/2018   Lab Results  Component Value Date   TSH 0.564 04/29/2018   Lab Results  Component Value Date   CHOL 230 (H) 05/31/2019   HDL 50 05/31/2019   LDLCALC 142 (H) 05/31/2019   TRIG 211 (H) 05/31/2019   Lab Results  Component Value Date   WBC 5.3 04/29/2018   HGB 14.5 04/29/2018   HCT 42.5 04/29/2018   MCV 91 04/29/2018   PLT 268 10/04/2016   No results found for: IRON, TIBC, FERRITIN  Attestation Statements:   Reviewed by clinician on day of visit:  allergies, medications, problem list, medical history, surgical history, family history, social history, and previous encounter notes.  Time spent on visit including pre-visit chart review and post-visit care was 25 minutes.    Wilhemena Durie, am acting as transcriptionist for [employer], PA-C.  I have reviewed the above documentation for accuracy and completeness, and I agree with the above. Abby Potash, PA-C

## 2019-06-16 NOTE — Telephone Encounter (Signed)
Scheduled appt per 2/23 sch message - pt aware of appts date and times

## 2019-06-18 ENCOUNTER — Inpatient Hospital Stay: Payer: 59

## 2019-06-22 MED FILL — OMEPRAZOLE 20 MG CAP: 20 | 90 days supply | Qty: 90 | Fill #3

## 2019-06-23 ENCOUNTER — Other Ambulatory Visit: Payer: Self-pay

## 2019-06-23 ENCOUNTER — Inpatient Hospital Stay: Payer: 59 | Attending: Hematology

## 2019-06-23 ENCOUNTER — Telehealth: Payer: Self-pay

## 2019-06-23 ENCOUNTER — Other Ambulatory Visit: Payer: Self-pay | Admitting: Hematology and Oncology

## 2019-06-23 DIAGNOSIS — N183 Chronic kidney disease, stage 3 unspecified: Secondary | ICD-10-CM | POA: Insufficient documentation

## 2019-06-23 DIAGNOSIS — C569 Malignant neoplasm of unspecified ovary: Secondary | ICD-10-CM | POA: Insufficient documentation

## 2019-06-23 DIAGNOSIS — Z6831 Body mass index (BMI) 31.0-31.9, adult: Secondary | ICD-10-CM | POA: Insufficient documentation

## 2019-06-23 DIAGNOSIS — Z5111 Encounter for antineoplastic chemotherapy: Secondary | ICD-10-CM | POA: Insufficient documentation

## 2019-06-23 DIAGNOSIS — Z7189 Other specified counseling: Secondary | ICD-10-CM

## 2019-06-23 DIAGNOSIS — E669 Obesity, unspecified: Secondary | ICD-10-CM | POA: Insufficient documentation

## 2019-06-23 LAB — CBC WITH DIFFERENTIAL (CANCER CENTER ONLY)
Abs Immature Granulocytes: 0.01 10*3/uL (ref 0.00–0.07)
Basophils Absolute: 0.1 10*3/uL (ref 0.0–0.1)
Basophils Relative: 1 %
Eosinophils Absolute: 0.2 10*3/uL (ref 0.0–0.5)
Eosinophils Relative: 3 %
HCT: 45.9 % (ref 36.0–46.0)
Hemoglobin: 15.6 g/dL — ABNORMAL HIGH (ref 12.0–15.0)
Immature Granulocytes: 0 %
Lymphocytes Relative: 29 %
Lymphs Abs: 1.8 10*3/uL (ref 0.7–4.0)
MCH: 30.8 pg (ref 26.0–34.0)
MCHC: 34 g/dL (ref 30.0–36.0)
MCV: 90.5 fL (ref 80.0–100.0)
Monocytes Absolute: 0.6 10*3/uL (ref 0.1–1.0)
Monocytes Relative: 10 %
Neutro Abs: 3.6 10*3/uL (ref 1.7–7.7)
Neutrophils Relative %: 57 %
Platelet Count: 304 10*3/uL (ref 150–400)
RBC: 5.07 MIL/uL (ref 3.87–5.11)
RDW: 11.5 % (ref 11.5–15.5)
WBC Count: 6.2 10*3/uL (ref 4.0–10.5)
nRBC: 0 % (ref 0.0–0.2)

## 2019-06-23 LAB — COMPREHENSIVE METABOLIC PANEL
ALT: 19 U/L (ref 0–44)
AST: 17 U/L (ref 15–41)
Albumin: 4.1 g/dL (ref 3.5–5.0)
Alkaline Phosphatase: 64 U/L (ref 38–126)
Anion gap: 10 (ref 5–15)
BUN: 27 mg/dL — ABNORMAL HIGH (ref 8–23)
CO2: 29 mmol/L (ref 22–32)
Calcium: 9.7 mg/dL (ref 8.9–10.3)
Chloride: 101 mmol/L (ref 98–111)
Creatinine, Ser: 1.22 mg/dL — ABNORMAL HIGH (ref 0.44–1.00)
GFR calc Af Amer: 55 mL/min — ABNORMAL LOW (ref >60)
GFR calc non Af Amer: 47 mL/min — ABNORMAL LOW (ref >60)
Glucose, Bld: 87 mg/dL (ref 70–99)
Potassium: 4.4 mmol/L (ref 3.5–5.1)
Sodium: 140 mmol/L (ref 135–145)
Total Bilirubin: 0.4 mg/dL (ref 0.3–1.2)
Total Protein: 7.4 g/dL (ref 6.5–8.1)

## 2019-06-23 NOTE — Telephone Encounter (Signed)
Pt called to ask if / when she should get covid vaccine. She had covid in Dec 2020 and was antibody positive in mid Feb 2021. Per Dr Alvy Bimler pt should wait at least 3 months. Pt made aware and verbalizes understanding.

## 2019-06-24 ENCOUNTER — Other Ambulatory Visit: Payer: Self-pay | Admitting: Hematology and Oncology

## 2019-06-24 ENCOUNTER — Other Ambulatory Visit: Payer: Self-pay

## 2019-06-24 ENCOUNTER — Inpatient Hospital Stay: Payer: 59

## 2019-06-24 VITALS — HR 63 | Temp 97.9°F | Resp 16

## 2019-06-24 DIAGNOSIS — C569 Malignant neoplasm of unspecified ovary: Secondary | ICD-10-CM

## 2019-06-24 DIAGNOSIS — Z7189 Other specified counseling: Secondary | ICD-10-CM

## 2019-06-24 DIAGNOSIS — E669 Obesity, unspecified: Secondary | ICD-10-CM | POA: Diagnosis not present

## 2019-06-24 DIAGNOSIS — N183 Chronic kidney disease, stage 3 unspecified: Secondary | ICD-10-CM | POA: Diagnosis not present

## 2019-06-24 DIAGNOSIS — Z5111 Encounter for antineoplastic chemotherapy: Secondary | ICD-10-CM | POA: Diagnosis not present

## 2019-06-24 DIAGNOSIS — Z6831 Body mass index (BMI) 31.0-31.9, adult: Secondary | ICD-10-CM | POA: Diagnosis not present

## 2019-06-24 LAB — INHIBIN B: Inhibin B: 108 pg/mL — ABNORMAL HIGH (ref 0.0–16.9)

## 2019-06-24 MED ORDER — PALONOSETRON HCL INJECTION 0.25 MG/5ML
0.2500 mg | Freq: Once | INTRAVENOUS | Status: AC
  Administered 2019-06-24: 0.25 mg via INTRAVENOUS

## 2019-06-24 MED ORDER — SODIUM CHLORIDE 0.9 % IV SOLN
456.0000 mg | Freq: Once | INTRAVENOUS | Status: AC
  Administered 2019-06-24: 460 mg via INTRAVENOUS
  Filled 2019-06-24: qty 46

## 2019-06-24 MED ORDER — PALONOSETRON HCL INJECTION 0.25 MG/5ML
INTRAVENOUS | Status: AC
  Filled 2019-06-24: qty 5

## 2019-06-24 MED ORDER — DEXAMETHASONE SODIUM PHOSPHATE 10 MG/ML IJ SOLN
INTRAMUSCULAR | Status: AC
  Filled 2019-06-24: qty 1

## 2019-06-24 MED ORDER — SODIUM CHLORIDE 0.9 % IV SOLN
150.0000 mg | Freq: Once | INTRAVENOUS | Status: AC
  Administered 2019-06-24: 150 mg via INTRAVENOUS
  Filled 2019-06-24: qty 150

## 2019-06-24 MED ORDER — DEXAMETHASONE SODIUM PHOSPHATE 10 MG/ML IJ SOLN
10.0000 mg | Freq: Once | INTRAMUSCULAR | Status: AC
  Administered 2019-06-24: 10 mg via INTRAVENOUS

## 2019-06-24 MED ORDER — SODIUM CHLORIDE 0.9 % IV SOLN
Freq: Once | INTRAVENOUS | Status: AC
  Filled 2019-06-24: qty 250

## 2019-06-24 NOTE — Patient Instructions (Signed)
[facility] Discharge Instructions for Patients Receiving Chemotherapy  Today you received the following chemotherapy agents: carboplatin.  To help prevent nausea and vomiting after your treatment, we encourage you to take your nausea medication as directed.   If you develop nausea and vomiting that is not controlled by your nausea medication, call the clinic.   BELOW ARE SYMPTOMS THAT SHOULD BE REPORTED IMMEDIATELY:  *FEVER GREATER THAN 100.5 F  *CHILLS WITH OR WITHOUT FEVER  NAUSEA AND VOMITING THAT IS NOT CONTROLLED WITH YOUR NAUSEA MEDICATION  *UNUSUAL SHORTNESS OF BREATH  *UNUSUAL BRUISING OR BLEEDING  TENDERNESS IN MOUTH AND THROAT WITH OR WITHOUT PRESENCE OF ULCERS  *URINARY PROBLEMS  *BOWEL PROBLEMS  UNUSUAL RASH Items with * indicate a potential emergency and should be followed up as soon as possible.  Feel free to call the clinic should you have any questions or concerns. The clinic phone number is [phone].  Please show the [facility] at check-in to the Emergency Department and triage nurse.  Carboplatin injection What is this medicine? CARBOPLATIN (KAR boe pla tin) is a chemotherapy drug. It targets fast dividing cells, like cancer cells, and causes these cells to die. This medicine is used to treat ovarian cancer and many other cancers. This medicine may be used for other purposes; ask your health care provider or pharmacist if you have questions. COMMON BRAND NAME(S): Paraplatin What should I tell my health care provider before I take this medicine? They need to know if you have any of these conditions:  blood disorders  hearing problems  kidney disease  recent or ongoing radiation therapy  an unusual or allergic reaction to carboplatin, cisplatin, other chemotherapy, other medicines, foods, dyes, or preservatives  pregnant or trying to get pregnant  breast-feeding How should I use this medicine? This drug is usually  given as an infusion into a vein. It is administered in a hospital or clinic by a specially trained health care professional. Talk to your pediatrician regarding the use of this medicine in children. Special care may be needed. Overdosage: If you think you have taken too much of this medicine contact a poison control center or emergency room at once. NOTE: This medicine is only for you. Do not share this medicine with others. What if I miss a dose? It is important not to miss a dose. Call your doctor or health care professional if you are unable to keep an appointment. What may interact with this medicine?  medicines for seizures  medicines to increase blood counts like filgrastim, pegfilgrastim, sargramostim  some antibiotics like amikacin, gentamicin, neomycin, streptomycin, tobramycin  vaccines Talk to your doctor or health care professional before taking any of these medicines:  acetaminophen  aspirin  ibuprofen  ketoprofen  naproxen This list may not describe all possible interactions. Give your health care provider a list of all the medicines, herbs, non-prescription drugs, or dietary supplements you use. Also tell them if you smoke, drink alcohol, or use illegal drugs. Some items may interact with your medicine. What should I watch for while using this medicine? Your condition will be monitored carefully while you are receiving this medicine. You will need important blood work done while you are taking this medicine. This drug may make you feel generally unwell. This is not uncommon, as chemotherapy can affect healthy cells as well as cancer cells. Report any side effects. Continue your course of treatment even though you feel ill unless your doctor tells you to stop.  In some cases, you may be given additional medicines to help with side effects. Follow all directions for their use. Call your doctor or health care professional for advice if you get a fever, chills or sore  throat, or other symptoms of a cold or flu. Do not treat yourself. This drug decreases your body's ability to fight infections. Try to avoid being around people who are sick. This medicine may increase your risk to bruise or bleed. Call your doctor or health care professional if you notice any unusual bleeding. Be careful brushing and flossing your teeth or using a toothpick because you may get an infection or bleed more easily. If you have any dental work done, tell your dentist you are receiving this medicine. Avoid taking products that contain aspirin, acetaminophen, ibuprofen, naproxen, or ketoprofen unless instructed by your doctor. These medicines may hide a fever. Do not become pregnant while taking this medicine. Women should inform their doctor if they wish to become pregnant or think they might be pregnant. There is a potential for serious side effects to an unborn child. Talk to your health care professional or pharmacist for more information. Do not breast-feed an infant while taking this medicine. What side effects may I notice from receiving this medicine? Side effects that you should report to your doctor or health care professional as soon as possible:  allergic reactions like skin rash, itching or hives, swelling of the face, lips, or tongue  signs of infection - fever or chills, cough, sore throat, pain or difficulty passing urine  signs of decreased platelets or bleeding - bruising, pinpoint red spots on the skin, black, tarry stools, nosebleeds  signs of decreased red blood cells - unusually weak or tired, fainting spells, lightheadedness  breathing problems  changes in hearing  changes in vision  chest pain  high blood pressure  low blood counts - This drug may decrease the number of white blood cells, red blood cells and platelets. You may be at increased risk for infections and bleeding.  nausea and vomiting  pain, swelling, redness or irritation at the injection  site  pain, tingling, numbness in the hands or feet  problems with balance, talking, walking  trouble passing urine or change in the amount of urine Side effects that usually do not require medical attention (report to your doctor or health care professional if they continue or are bothersome):  hair loss  loss of appetite  metallic taste in the mouth or changes in taste This list may not describe all possible side effects. Call your doctor for medical advice about side effects. You may report side effects to FDA at 1-800-FDA-1088. Where should I keep my medicine? This drug is given in a hospital or clinic and will not be stored at home. NOTE: This sheet is a summary. It may not cover all possible information. If you have questions about this medicine, talk to your doctor, pharmacist, or health care provider.  2020 Elsevier/Gold Standard (2007-07-14 14:38:05)   

## 2019-06-25 ENCOUNTER — Telehealth: Payer: Self-pay | Admitting: *Deleted

## 2019-06-25 ENCOUNTER — Telehealth: Payer: Self-pay

## 2019-06-25 NOTE — Telephone Encounter (Signed)
Called to follow up after first treatment. She is doing well, with no complaints. Drinking a lot of fluids. Instructed to call the office if needed.

## 2019-06-25 NOTE — Telephone Encounter (Signed)
FMLA successfully faxed to [employer] at [phone].  Original copy to be picked up by patient.

## 2019-06-25 NOTE — Telephone Encounter (Signed)
-----   Message from Sinda Du, RN sent at 06/24/2019  8:59 AM EST ----- Regarding: Dr. Alvy Bimler - 1st chemo f/u 1st chemo f/u

## 2019-07-07 ENCOUNTER — Encounter (INDEPENDENT_AMBULATORY_CARE_PROVIDER_SITE_OTHER): Payer: Self-pay | Admitting: Physician Assistant

## 2019-07-07 ENCOUNTER — Other Ambulatory Visit: Payer: Self-pay

## 2019-07-07 ENCOUNTER — Telehealth (INDEPENDENT_AMBULATORY_CARE_PROVIDER_SITE_OTHER): Payer: 59 | Admitting: Physician Assistant

## 2019-07-07 DIAGNOSIS — Z6831 Body mass index (BMI) 31.0-31.9, adult: Secondary | ICD-10-CM

## 2019-07-07 DIAGNOSIS — E559 Vitamin D deficiency, unspecified: Secondary | ICD-10-CM

## 2019-07-07 DIAGNOSIS — E8881 Metabolic syndrome: Secondary | ICD-10-CM | POA: Diagnosis not present

## 2019-07-07 DIAGNOSIS — E669 Obesity, unspecified: Secondary | ICD-10-CM | POA: Diagnosis not present

## 2019-07-08 MED ORDER — METFORMIN HCL 1000 MG PO TABS: 1000.0000 mg | ORAL_TABLET | Freq: Two times a day (BID) | ORAL | 0 refills | Status: DC

## 2019-07-08 MED ORDER — VITAMIN D (ERGOCALCIFEROL) 1.25 MG (50000 UNIT) PO CAPS: 50000.0000 [IU] | ORAL_CAPSULE | ORAL | 0 refills | Status: DC

## 2019-07-08 MED FILL — VIT D2 1.25 MG (50,000 UNIT: 1.25 MG | 28 days supply | Qty: 4 | Fill #0

## 2019-07-08 MED FILL — METFORMIN HCL 1000 MG TABS: 1000 | 30 days supply | Qty: 60 | Fill #0

## 2019-07-08 NOTE — Progress Notes (Signed)
TeleHealth Visit:  Due to the COVID-19 pandemic, this visit was completed with telemedicine (audio/video) technology to reduce patient and provider exposure as well as to preserve personal protective equipment.   [name] has verbally consented to this TeleHealth visit. The patient is located at work, the provider is located at the [facility] office. The participants in this visit include the listed provider and patient. The visit was conducted today via FaceTime.  Chief Complaint: OBESITY [name] is here to discuss her progress with her obesity treatment plan along with follow-up of her obesity related diagnoses. [name] is on the Category 2 Plan and journaling 1200 calories + 85 grams of protein and states she is following her eating plan approximately 80% of the time. [name] states she is walking on the treadmill 15-20 minutes 2 times per week.  Today's visit was #: 22 Starting weight: 209 lbs Starting date: 04/29/2018  Interim History: [name] reports that she started chemo and is doing well overall. She has a steroid prep before chemo and noted that it increases her cravings for carbs. She is journaling her food.  Subjective:   Insulin resistance. [name] has a diagnosis of insulin resistance based on her elevated fasting insulin level >5. She continues to work on diet and exercise to decrease her risk of diabetes. [name] is on metformin. No nausea, vomiting, diarrhea, or polyphagia.  Lab Results  Component Value Date   INSULIN 16.5 05/31/2019   INSULIN 17.6 11/19/2018   INSULIN 25.8 (H) 04/29/2018   Lab Results  Component Value Date   HGBA1C 5.3 05/31/2019   Vitamin D deficiency. [name] is on Vitamin D. No nausea, vomiting, or muscle weakness. She is walking outside twice weekly. Last Vitamin D 54.0 on 05/31/2019.  Assessment/Plan:   Insulin resistance. [name] will continue to work on weight loss, exercise, and decreasing simple carbohydrates to help decrease the  risk of diabetes. [name] agreed to follow-up with [name] as directed to closely monitor her progress. She was given a refill on her metFORMIN (GLUCOPHAGE) 1000 MG tablet #60 with 0 refills.  Vitamin D deficiency. Low Vitamin D level contributes to fatigue and are associated with obesity, breast, and colon cancer. She was given a refill on her Vitamin D, Ergocalciferol, (DRISDOL) 1.25 MG (50000 UNIT) CAPS capsule every week #4 with 0 refills and will follow-up for routine testing of Vitamin D, at least 2-3 times per year to avoid over-replacement.   Class 1 obesity with serious comorbidity and body mass index (BMI) of 31.0 to 31.9 in adult, unspecified obesity type.  [name] is currently in the action stage of change. As such, her goal is to continue with weight loss efforts. She has agreed to keeping a food journal and adhering to recommended goals of 1200 calories and 85 grams of protein daily.   Exercise goals: For substantial health benefits, adults should do at least 150 minutes (2 hours and 30 minutes) a week of moderate-intensity, or 75 minutes (1 hour and 15 minutes) a week of vigorous-intensity aerobic physical activity, or an equivalent combination of moderate- and vigorous-intensity aerobic activity. Aerobic activity should be performed in episodes of at least 10 minutes, and preferably, it should be spread throughout the week.  Behavioral modification strategies: meal planning and cooking strategies and keeping healthy foods in the home.  [name] has agreed to follow-up with our clinic in 3-4 weeks. She was informed of the importance of frequent follow-up visits to maximize her success with intensive lifestyle  modifications for her multiple health conditions.  Objective:   VITALS: Per patient if applicable, see vitals. GENERAL: Alert and in no acute distress. CARDIOPULMONARY: No increased WOB. Speaking in clear sentences.  PSYCH: Pleasant and cooperative. Speech normal rate and rhythm. Affect is  appropriate. Insight and judgement are appropriate. Attention is focused, linear, and appropriate.  NEURO: Oriented as arrived to appointment on time with no prompting.   Lab Results  Component Value Date   CREATININE 1.22 (H) 06/23/2019   BUN 27 (H) 06/23/2019   NA 140 06/23/2019   K 4.4 06/23/2019   CL 101 06/23/2019   CO2 29 06/23/2019   Lab Results  Component Value Date   ALT 19 06/23/2019   AST 17 06/23/2019   ALKPHOS 64 06/23/2019   BILITOT 0.4 06/23/2019   Lab Results  Component Value Date   HGBA1C 5.3 05/31/2019   HGBA1C 5.2 11/19/2018   HGBA1C 5.5 04/29/2018   Lab Results  Component Value Date   INSULIN 16.5 05/31/2019   INSULIN 17.6 11/19/2018   INSULIN 25.8 (H) 04/29/2018   Lab Results  Component Value Date   TSH 0.564 04/29/2018   Lab Results  Component Value Date   CHOL 230 (H) 05/31/2019   HDL 50 05/31/2019   LDLCALC 142 (H) 05/31/2019   TRIG 211 (H) 05/31/2019   Lab Results  Component Value Date   WBC 6.2 06/23/2019   HGB 15.6 (H) 06/23/2019   HCT 45.9 06/23/2019   MCV 90.5 06/23/2019   PLT 304 06/23/2019   No results found for: IRON, TIBC, FERRITIN  Attestation Statements:   Reviewed by clinician on day of visit: allergies, medications, problem list, medical history, surgical history, family history, social history, and previous encounter notes.  IMichaelene Song, am acting as transcriptionist for Abby Potash, PA-C   I have reviewed the above documentation for accuracy and completeness, and I agree with the above. Abby Potash, PA-C

## 2019-07-09 NOTE — Progress Notes (Signed)
Pharmacist Chemotherapy Monitoring - Follow Up Assessment    I verify that I have reviewed each item in the below checklist:  . Regimen for the patient is scheduled for the appropriate day and plan matches scheduled date. Marland Kitchen Appropriate non-routine labs are ordered dependent on drug ordered. . If applicable, additional medications reviewed and ordered per protocol based on lifetime cumulative doses and/or treatment regimen.   Plan for follow-up and/or issues identified: No . I-vent associated with next due treatment: No . MD and/or nursing notified: No  Philomena Course 07/09/2019 8:11 AM

## 2019-07-14 ENCOUNTER — Inpatient Hospital Stay: Payer: 59

## 2019-07-14 ENCOUNTER — Other Ambulatory Visit: Payer: Self-pay | Admitting: Hematology and Oncology

## 2019-07-14 ENCOUNTER — Other Ambulatory Visit: Payer: Self-pay

## 2019-07-14 DIAGNOSIS — Z5111 Encounter for antineoplastic chemotherapy: Secondary | ICD-10-CM | POA: Diagnosis not present

## 2019-07-14 DIAGNOSIS — C569 Malignant neoplasm of unspecified ovary: Secondary | ICD-10-CM | POA: Diagnosis not present

## 2019-07-14 DIAGNOSIS — Z6831 Body mass index (BMI) 31.0-31.9, adult: Secondary | ICD-10-CM | POA: Diagnosis not present

## 2019-07-14 DIAGNOSIS — N183 Chronic kidney disease, stage 3 unspecified: Secondary | ICD-10-CM | POA: Diagnosis not present

## 2019-07-14 DIAGNOSIS — E669 Obesity, unspecified: Secondary | ICD-10-CM | POA: Diagnosis not present

## 2019-07-14 DIAGNOSIS — Z7189 Other specified counseling: Secondary | ICD-10-CM

## 2019-07-14 LAB — CBC WITH DIFFERENTIAL (CANCER CENTER ONLY)
Abs Immature Granulocytes: 0.01 10*3/uL (ref 0.00–0.07)
Basophils Absolute: 0 10*3/uL (ref 0.0–0.1)
Basophils Relative: 1 %
Eosinophils Absolute: 0.1 10*3/uL (ref 0.0–0.5)
Eosinophils Relative: 1 %
HCT: 37.8 % (ref 36.0–46.0)
Hemoglobin: 12.9 g/dL (ref 12.0–15.0)
Immature Granulocytes: 0 %
Lymphocytes Relative: 45 %
Lymphs Abs: 1.9 10*3/uL (ref 0.7–4.0)
MCH: 31.2 pg (ref 26.0–34.0)
MCHC: 34.1 g/dL (ref 30.0–36.0)
MCV: 91.5 fL (ref 80.0–100.0)
Monocytes Absolute: 0.4 10*3/uL (ref 0.1–1.0)
Monocytes Relative: 9 %
Neutro Abs: 1.8 10*3/uL (ref 1.7–7.7)
Neutrophils Relative %: 44 %
Platelet Count: 156 10*3/uL (ref 150–400)
RBC: 4.13 MIL/uL (ref 3.87–5.11)
RDW: 12 % (ref 11.5–15.5)
WBC Count: 4.2 10*3/uL (ref 4.0–10.5)
nRBC: 0 % (ref 0.0–0.2)

## 2019-07-14 LAB — COMPREHENSIVE METABOLIC PANEL
ALT: 23 U/L (ref 0–44)
AST: 19 U/L (ref 15–41)
Albumin: 3.8 g/dL (ref 3.5–5.0)
Alkaline Phosphatase: 68 U/L (ref 38–126)
Anion gap: 9 (ref 5–15)
BUN: 22 mg/dL (ref 8–23)
CO2: 29 mmol/L (ref 22–32)
Calcium: 9.2 mg/dL (ref 8.9–10.3)
Chloride: 103 mmol/L (ref 98–111)
Creatinine, Ser: 1.12 mg/dL — ABNORMAL HIGH (ref 0.44–1.00)
GFR calc Af Amer: >60 mL/min (ref >60)
GFR calc non Af Amer: 53 mL/min — ABNORMAL LOW (ref >60)
Glucose, Bld: 102 mg/dL — ABNORMAL HIGH (ref 70–99)
Potassium: 3.8 mmol/L (ref 3.5–5.1)
Sodium: 141 mmol/L (ref 135–145)
Total Bilirubin: 0.4 mg/dL (ref 0.3–1.2)
Total Protein: 6.6 g/dL (ref 6.5–8.1)

## 2019-07-15 ENCOUNTER — Inpatient Hospital Stay (HOSPITAL_BASED_OUTPATIENT_CLINIC_OR_DEPARTMENT_OTHER): Payer: 59 | Admitting: Hematology and Oncology

## 2019-07-15 ENCOUNTER — Encounter: Payer: Self-pay | Admitting: Hematology and Oncology

## 2019-07-15 ENCOUNTER — Other Ambulatory Visit: Payer: 59

## 2019-07-15 ENCOUNTER — Inpatient Hospital Stay: Payer: 59

## 2019-07-15 ENCOUNTER — Other Ambulatory Visit: Payer: Self-pay

## 2019-07-15 DIAGNOSIS — C569 Malignant neoplasm of unspecified ovary: Secondary | ICD-10-CM

## 2019-07-15 DIAGNOSIS — E669 Obesity, unspecified: Secondary | ICD-10-CM | POA: Diagnosis not present

## 2019-07-15 DIAGNOSIS — E66811 Obesity, class 1: Secondary | ICD-10-CM

## 2019-07-15 DIAGNOSIS — N183 Chronic kidney disease, stage 3 unspecified: Secondary | ICD-10-CM | POA: Diagnosis not present

## 2019-07-15 DIAGNOSIS — Z5111 Encounter for antineoplastic chemotherapy: Secondary | ICD-10-CM | POA: Diagnosis not present

## 2019-07-15 DIAGNOSIS — Z6831 Body mass index (BMI) 31.0-31.9, adult: Secondary | ICD-10-CM

## 2019-07-15 DIAGNOSIS — Z7189 Other specified counseling: Secondary | ICD-10-CM

## 2019-07-15 MED ORDER — DEXAMETHASONE SODIUM PHOSPHATE 10 MG/ML IJ SOLN
INTRAMUSCULAR | Status: AC
  Filled 2019-07-15: qty 1

## 2019-07-15 MED ORDER — SODIUM CHLORIDE 0.9 % IV SOLN
485.5000 mg | Freq: Once | INTRAVENOUS | Status: AC
  Administered 2019-07-15: 490 mg via INTRAVENOUS
  Filled 2019-07-15: qty 49

## 2019-07-15 MED ORDER — SODIUM CHLORIDE 0.9 % IV SOLN
150.0000 mg | Freq: Once | INTRAVENOUS | Status: AC
  Administered 2019-07-15: 150 mg via INTRAVENOUS
  Filled 2019-07-15: qty 150

## 2019-07-15 MED ORDER — DEXAMETHASONE SODIUM PHOSPHATE 10 MG/ML IJ SOLN
10.0000 mg | Freq: Once | INTRAMUSCULAR | Status: AC
  Administered 2019-07-15: 10 mg via INTRAVENOUS

## 2019-07-15 MED ORDER — PALONOSETRON HCL INJECTION 0.25 MG/5ML
0.2500 mg | Freq: Once | INTRAVENOUS | Status: AC
  Administered 2019-07-15: 0.25 mg via INTRAVENOUS

## 2019-07-15 MED ORDER — PALONOSETRON HCL INJECTION 0.25 MG/5ML
INTRAVENOUS | Status: AC
  Filled 2019-07-15: qty 5

## 2019-07-15 MED ORDER — SODIUM CHLORIDE 0.9 % IV SOLN
Freq: Once | INTRAVENOUS | Status: AC
  Filled 2019-07-15: qty 250

## 2019-07-15 NOTE — Patient Instructions (Signed)
[facility] Discharge Instructions for Patients Receiving Chemotherapy  Today you received the following chemotherapy agents: Carboplatin (Paraplatin)  To help prevent nausea and vomiting after your treatment, we encourage you to take your nausea medication as directed by your provider   If you develop nausea and vomiting that is not controlled by your nausea medication, call the clinic.   BELOW ARE SYMPTOMS THAT SHOULD BE REPORTED IMMEDIATELY:  *FEVER GREATER THAN 100.5 F  *CHILLS WITH OR WITHOUT FEVER  NAUSEA AND VOMITING THAT IS NOT CONTROLLED WITH YOUR NAUSEA MEDICATION  *UNUSUAL SHORTNESS OF BREATH  *UNUSUAL BRUISING OR BLEEDING  TENDERNESS IN MOUTH AND THROAT WITH OR WITHOUT PRESENCE OF ULCERS  *URINARY PROBLEMS  *BOWEL PROBLEMS  UNUSUAL RASH Items with * indicate a potential emergency and should be followed up as soon as possible.  Feel free to call the clinic should you have any questions or concerns. The clinic phone number is [phone].  Please show the CHEMO ALERT CARD at check-in to the Emergency Department and triage nurse.   

## 2019-07-15 NOTE — Assessment & Plan Note (Signed)
She has intermittent fluctuation of creatinine function We will adjust her carboplatin dose accordingly

## 2019-07-15 NOTE — Assessment & Plan Note (Signed)
So far, she tolerated single agent carboplatin well except for superficial thrombophlebitis at previous venous access site Per discussion with pharmacist, we will dilute the carboplatin a little further to see if that would help minimize recurrent superficial thrombophlebitis I recommend minimum 4 cycles of treatment before repeat imaging study She is in agreement

## 2019-07-15 NOTE — Assessment & Plan Note (Signed)
She is doing well with weight management Her recent blood sugar was mildly elevated but it was done without fasting which I think is acceptable She will continue her best effort

## 2019-07-15 NOTE — Progress Notes (Signed)
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[place]) So far, she tolerated single agent carboplatin well except for superficial thrombophlebitis at previous venous access site Per discussion with pharmacist, we will dilute the carboplatin a little further to see if that would help minimize recurrent superficial thrombophlebitis I recommend minimum 4 cycles of treatment before repeat imaging study She is in agreement  CKD (chronic kidney disease), stage III She has intermittent fluctuation of creatinine function We will adjust her carboplatin dose accordingly  Class 1 obesity with serious comorbidity and body mass index (BMI) of 31.0 to 31.9 in adult She is doing well with weight management Her recent blood sugar was mildly elevated but it was done without fasting which I think is acceptable She will continue her best effort   No orders of the defined types were placed in this encounter.   All questions were answered. The patient knows to call the clinic with any problems, questions or concerns. The total time spent in the appointment was 20 minutes encounter with patients including review of chart and various tests results, discussions about plan of care and coordination of care plan   Heath Lark, MD 07/15/2019 9:12 AM  INTERVAL HISTORY: Please see below for problem oriented charting. She is seen prior to cycle 2 of treatment She had no nausea She have very slight heartburn recently She also complained of mild superficial thrombophlebitis at the site of IV access No changes in bowel habits such as constipation Overall, she is doing well with treatment No infusion reaction  SUMMARY OF ONCOLOGIC HISTORY: Oncology History Overview Note  2018 - Core biopsy for ER/PR and Foundation One testing performed. Unfortunately, no sufficient tissue for  Foundation One testing. ER was 50% and PR 90%.  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     - 09/2000 Chemotherapy   6 cycles of IP cisplatin and etoposide    02/2009 Relapse/Recurrence   CT showed increased size of nodules in pelvis   2010 Surgery   Exlap with section of tumor nodules near cecum and left pelvic sidewall. Tumor: ER negative, PR positive    Treatment Plan Change   Alternated 2 week courses of Megace and Tamoxifen - ended 03/2012   03/2012 PET scan   CT - progressive disease   2013 Treatment Plan Change   Letrozole   01/2016 Imaging   MRI showed progressive disease   2017 Treatment Plan Change   Two weeks of alternating Tamoxifen 20mg  daily and then Megace 40mg  TID   08/2016 Imaging   MRI showed progressive disease with peritoneal implants near liver, in pelvis   01/2017 Treatment Plan Change   Lupron 11.25 q 3 months   11/2017 Imaging   Overall mixed response.   Mixed cystic/solid lesions in the left pelvis are mildly improved.   Cystic peritoneal disease, including the dominant lesion along the posterior right hepatic lobe, is mildly progressed.  Subcapsular lesion along the posterior right hepatic lobe is unchanged.   03/2018 Imaging   MRI A/P: Mixed response of individual peritoneal metastases in the pelvis, as described above. Overall, there has been no significant change in bulk of disease.   Stable cystic peritoneal metastatic disease  along the capsular surfaces of the liver and spleen.   No new sites of metastatic disease identified within the abdomen or pelvis.   08/2018 Imaging   MRI A/P: Status post hysterectomy and bilateral salpingo-oophorectomy.   Mixed cystic/solid peritoneal implants in the abdomen/pelvis, as above. Dominant cystic implant along the posterior liver surface is mildly increased. Remaining lesions are overall grossly unchanged.   No new lesions are identified.   01/28/2019 Imaging   Mri A/P: 1. Relatively similar appearance of peritoneal metastasis. A posterior right hepatic capsular based lesion is similar to minimally decreased in size. Left pelvic implants are primarily similar with possible enlargement of an anterior high left pelvic cystic implant. No new disease identified. 2.  Aortic Atherosclerosis (ICD10-I70.0). 3. Hepatic steatosis. 4. Left adrenal adenoma.   06/02/2019 Imaging   MRI 1. Potential slight enlargement of dominant cystic area and solid component, associated with rind like signal variation on T2 along the inferior right hepatic margin, also potentially slightly increased. Findings may still be within the realm of measurement and technical variation. Close attention on follow-up. 2. Subtle cystic changes along the cephalad margin of the spleen are difficult to see on previous imaging, perhaps new compared with prior imaging studies. 3. Pelvic implants and left lower quadrant lesion with similar size, of the area along the left iliac vasculature may be slightly larger than on the prior study. 4. Signs of extensive retroperitoneal and pelvic lymphadenectomy. 5. Hepatic steatosis. 6. 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     06/08/2019 Cancer Staging   Staging form: [place], AJCC 7th Edition - Clinical: Stage IIIC (rT2, N1, M0) - Signed by Heath Lark, MD on 06/08/2019   06/10/2019 Imaging   1. Multiple redemonstrated partially  solid metastatic implants in the hepatorenal recess, left paracolic gutter, left pelvis, and likely the tip of the spleen as detailed above and as seen on recent prior MRI dated 06/02/2019. These findings are slightly worsened in comparison to a remote prior CT examination dated 10/04/2014.   2.  No evidence of metastatic disease in the chest.   3. Status post hysterectomy, pelvic and retroperitoneal lymph node dissection, and ventral hernia mesh repair.   4.  Hepatic steatosis.   5.  Aortic Atherosclerosis (ICD10-I70.0).   06/24/2019 -  Chemotherapy   The patient had carboplatin for chemotherapy treatment.       REVIEW OF SYSTEMS:   Constitutional: Denies fevers, chills or abnormal weight loss Eyes: Denies blurriness of vision Ears, nose, mouth, throat, and face: Denies mucositis or sore throat Respiratory: Denies cough, dyspnea or wheezes Cardiovascular: Denies palpitation, chest discomfort or lower extremity swelling Skin: Denies abnormal skin rashes Lymphatics: Denies new lymphadenopathy or easy bruising Neurological:Denies numbness, tingling or new weaknesses Behavioral/Psych: Mood is stable, no new changes  All other systems were reviewed with the patient and are negative.  I have reviewed the past medical history, past surgical history, social history and family history with the patient and they are unchanged from previous note.  ALLERGIES:  is allergic to codeine; oxycodone; sodium thiosalicylate; ciprofloxacin; codone [hydrocodone bitartrate]; daypro [oxaprozin]; levofloxacin; stadol [butorphanol tartrate]; and sulfa antibiotics.  MEDICATIONS:  Current Outpatient  Medications  Medication Sig Dispense Refill  . Biotin 10 MG CAPS Take by mouth.    . Calcium Citrate 200 MG TABS Take by mouth.    . CARBOplatin 1000 MG/100ML SOLN Inject into the vein.    Marland Kitchen escitalopram (LEXAPRO) 20 MG tablet Take 10 mg by mouth daily.     . metFORMIN (GLUCOPHAGE) 1000 MG tablet Take 1 tablet  (1,000 mg total) by mouth 2 (two) times daily with a meal. 60 tablet 0  . Multiple Vitamin (MULTIVITAMIN) capsule Take 1 capsule by mouth daily.    . Naltrexone-buPROPion HCl ER (CONTRAVE) 8-90 MG TB12 Take 2 tablets by mouth daily.    . naproxen (NAPROSYN) 500 MG tablet Take 500 mg by mouth 2 (two) times daily with a meal.    . omeprazole (PRILOSEC) 20 MG capsule Take 20 mg by mouth daily.     . ondansetron (ZOFRAN) 8 MG tablet Take 1 tablet (8 mg total) by mouth every 8 (eight) hours as needed for refractory nausea / vomiting. Start on day 3 after carboplatin chemo. 30 tablet 1  . Probiotic Product (ALIGN PO) Take 1 tablet by mouth daily.     . prochlorperazine (COMPAZINE) 10 MG tablet Take 1 tablet (10 mg total) by mouth every 6 (six) hours as needed (Nausea or vomiting). 30 tablet 1  . Vitamin D, Ergocalciferol, (DRISDOL) 1.25 MG (50000 UNIT) CAPS capsule Take 1 capsule (50,000 Units total) by mouth every 7 (seven) days. 4 capsule 0   No current facility-administered medications for this visit.   Facility-Administered Medications Ordered in Other Visits  Medication Dose Route Frequency Provider Last Rate Last Admin  . CARBOplatin (PARAPLATIN) 490 mg in sodium chloride 0.9 % 500 mL chemo infusion  490 mg Intravenous Once Alvy Bimler, Jasin Brazel, MD      . dexamethasone (DECADRON) injection 10 mg  10 mg Intravenous Once Alvy Bimler, Naveyah Iacovelli, MD      . fosaprepitant (EMEND) 150 mg in sodium chloride 0.9 % 145 mL IVPB  150 mg Intravenous Once Alvy Bimler, Shanyla Marconi, MD      . palonosetron (ALOXI) injection 0.25 mg  0.25 mg Intravenous Once Alvy Bimler, Townsend Cudworth, MD        PHYSICAL EXAMINATION: ECOG PERFORMANCE STATUS: 1 - Symptomatic but completely ambulatory  Vitals:   07/15/19 0843  BP: 139/76  Pulse: (!) 58  Resp: 18  Temp: 97.9 F (36.6 C)  SpO2: 99%   Filed Weights   07/15/19 0843  Weight: 196 lb 9.6 oz (89.2 kg)    GENERAL:alert, no distress and comfortable SKIN: skin color, texture, turgor are normal, no rashes  or significant lesions EYES: normal, Conjunctiva are pink and non-injected, sclera clear OROPHARYNX:no exudate, no erythema and lips, buccal mucosa, and tongue normal  NECK: supple, thyroid normal size, non-tender, without nodularity LYMPH:  no palpable lymphadenopathy in the cervical, axillary or inguinal LUNGS: clear to auscultation and percussion with normal breathing effort HEART: regular rate & rhythm and no murmurs and no lower extremity edema ABDOMEN:abdomen soft, non-tender and normal bowel sounds Musculoskeletal:no cyanosis of digits and no clubbing  NEURO: alert & oriented x 3 with fluent speech, no focal motor/sensory deficits  LABORATORY DATA:  I have reviewed the data as listed    Component Value Date/Time   NA 141 07/14/2019 1515   NA 141 05/31/2019 0910   NA 141 09/24/2013 1131   K 3.8 07/14/2019 1515   K 4.5 09/24/2013 1131   CL 103 07/14/2019 1515   CO2 29 07/14/2019 1515  CO2 24 09/24/2013 1131   GLUCOSE 102 (H) 07/14/2019 1515   GLUCOSE 90 09/24/2013 1131   BUN 22 07/14/2019 1515   BUN 18 05/31/2019 0910   BUN 21.2 09/24/2013 1131   CREATININE 1.12 (H) 07/14/2019 1515   CREATININE 1.18 (H) 09/02/2018 0917   CREATININE 1.1 09/24/2013 1131   CALCIUM 9.2 07/14/2019 1515   CALCIUM 9.5 09/24/2013 1131   PROT 6.6 07/14/2019 1515   PROT 6.6 05/31/2019 0910   PROT 6.8 09/24/2013 1131   ALBUMIN 3.8 07/14/2019 1515   ALBUMIN 4.5 05/31/2019 0910   ALBUMIN 3.9 09/24/2013 1131   AST 19 07/14/2019 1515   AST 23 09/24/2013 1131   ALT 23 07/14/2019 1515   ALT 27 09/24/2013 1131   ALKPHOS 68 07/14/2019 1515   ALKPHOS 81 09/24/2013 1131   BILITOT 0.4 07/14/2019 1515   BILITOT 0.3 05/31/2019 0910   BILITOT 0.53 09/24/2013 1131   GFRNONAA 53 (L) 07/14/2019 1515   GFRNONAA 50 (L) 09/02/2018 0917   GFRAA >60 07/14/2019 1515   GFRAA 58 (L) 09/02/2018 0917    No results found for: SPEP, UPEP  Lab Results  Component Value Date   WBC 4.2 07/14/2019   NEUTROABS 1.8  07/14/2019   HGB 12.9 07/14/2019   HCT 37.8 07/14/2019   MCV 91.5 07/14/2019   PLT 156 07/14/2019      Chemistry      Component Value Date/Time   NA 141 07/14/2019 1515   NA 141 05/31/2019 0910   NA 141 09/24/2013 1131   K 3.8 07/14/2019 1515   K 4.5 09/24/2013 1131   CL 103 07/14/2019 1515   CO2 29 07/14/2019 1515   CO2 24 09/24/2013 1131   BUN 22 07/14/2019 1515   BUN 18 05/31/2019 0910   BUN 21.2 09/24/2013 1131   CREATININE 1.12 (H) 07/14/2019 1515   CREATININE 1.18 (H) 09/02/2018 0917   CREATININE 1.1 09/24/2013 1131      Component Value Date/Time   CALCIUM 9.2 07/14/2019 1515   CALCIUM 9.5 09/24/2013 1131   ALKPHOS 68 07/14/2019 1515   ALKPHOS 81 09/24/2013 1131   AST 19 07/14/2019 1515   AST 23 09/24/2013 1131   ALT 23 07/14/2019 1515   ALT 27 09/24/2013 1131   BILITOT 0.4 07/14/2019 1515   BILITOT 0.3 05/31/2019 0910   BILITOT 0.53 09/24/2013 1131

## 2019-07-20 MED FILL — ESCITALOPRAM 20 MG TABLET: 20 | 90 days supply | Qty: 90 | Fill #0

## 2019-07-26 ENCOUNTER — Encounter: Payer: Self-pay | Admitting: Hematology and Oncology

## 2019-07-26 ENCOUNTER — Other Ambulatory Visit: Payer: Self-pay

## 2019-07-26 ENCOUNTER — Telehealth: Payer: Self-pay

## 2019-07-26 ENCOUNTER — Inpatient Hospital Stay: Payer: 59 | Attending: Hematology | Admitting: Hematology and Oncology

## 2019-07-26 DIAGNOSIS — L0390 Cellulitis, unspecified: Secondary | ICD-10-CM | POA: Insufficient documentation

## 2019-07-26 DIAGNOSIS — N183 Chronic kidney disease, stage 3 unspecified: Secondary | ICD-10-CM | POA: Insufficient documentation

## 2019-07-26 DIAGNOSIS — C786 Secondary malignant neoplasm of retroperitoneum and peritoneum: Secondary | ICD-10-CM | POA: Insufficient documentation

## 2019-07-26 DIAGNOSIS — C569 Malignant neoplasm of unspecified ovary: Secondary | ICD-10-CM | POA: Insufficient documentation

## 2019-07-26 DIAGNOSIS — D61818 Other pancytopenia: Secondary | ICD-10-CM | POA: Insufficient documentation

## 2019-07-26 DIAGNOSIS — J029 Acute pharyngitis, unspecified: Secondary | ICD-10-CM | POA: Diagnosis not present

## 2019-07-26 DIAGNOSIS — Z5111 Encounter for antineoplastic chemotherapy: Secondary | ICD-10-CM | POA: Diagnosis not present

## 2019-07-26 MED ORDER — CEPHALEXIN 500 MG PO CAPS: 500.0000 mg | ORAL_CAPSULE | Freq: Two times a day (BID) | ORAL | 0 refills | Status: DC

## 2019-07-26 MED FILL — CEPHALEXIN 500 MG CAPSULE: 500 | 7 days supply | Qty: 14 | Fill #0

## 2019-07-26 NOTE — Telephone Encounter (Signed)
Pt scheduled, and notified.

## 2019-07-26 NOTE — Telephone Encounter (Signed)
Pt called reporting knot to left forearm.  Sore and warm to the touch.  Pt noticed knot over the weekend, applied heat to area X 2-3 days.  Small amount of redness.  No recent trauma to area.    Pt reports this is around same site where IV was placed on 3/25, unsure if its related.   Will forward to MD for review/recommendations.

## 2019-07-26 NOTE — Assessment & Plan Note (Signed)
Unfortunately, she developed mild cellulitis at the site of prior IV insertion I recommend a course of antibiotics I also recommend some ice rather than warm compress along with acetaminophen I will call her and reassess in a few days

## 2019-07-26 NOTE — Telephone Encounter (Signed)
Can she come over at 12 pm so I can see her? 15 mins appt

## 2019-07-26 NOTE — Progress Notes (Signed)
[place] OFFICE PROGRESS NOTE  Patient Care Team: [name], MD as PCP - General (Internal Medicine)  ASSESSMENT & PLAN:  Cellulitis Unfortunately, she developed mild cellulitis at the site of prior IV insertion I recommend a course of antibiotics I also recommend some ice rather than warm compress along with acetaminophen I will call her and reassess in a few days   No orders of the defined types were placed in this encounter.   All questions were answered. The patient knows to call the clinic with any problems, questions or concerns. The total time spent in the appointment was 10 minutes encounter with patients including review of chart and various tests results, discussions about plan of care and coordination of care plan   Heath Lark, MD 07/26/2019 12:37 PM  INTERVAL HISTORY: Please see below for problem oriented charting. She is seen urgently due to new findings at prior IV site for chemotherapy With the first dose of chemotherapy, she developed superficial thrombophlebitis At this time, she noticed some redness and significant soft tissue swelling She denies fever or chills She tried warm compress and naproxen without success  SUMMARY OF ONCOLOGIC HISTORY: Oncology History Overview Note  2018 - Core biopsy for ER/PR and Foundation One testing performed. Unfortunately, no sufficient tissue for Foundation One testing. ER was 50% and PR 90%.  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     - 09/2000 Chemotherapy   6 cycles of IP cisplatin and etoposide    02/2009 Relapse/Recurrence   CT showed increased size of nodules in pelvis   2010 Surgery   Exlap with section of tumor nodules near cecum and left pelvic sidewall. Tumor: ER negative, PR positive    Treatment Plan Change   Alternated 2 week courses of Megace and Tamoxifen - ended 03/2012   03/2012 PET scan   CT - progressive disease   2013 Treatment Plan Change   Letrozole   01/2016 Imaging   MRI showed progressive disease   2017 Treatment Plan Change   Two weeks of alternating Tamoxifen 20mg  daily and then Megace 40mg  TID   08/2016 Imaging   MRI showed progressive disease with peritoneal implants near liver, in pelvis   01/2017 Treatment Plan Change   Lupron 11.25 q 3 months   11/2017 Imaging   Overall mixed response.   Mixed cystic/solid lesions in the left pelvis are mildly improved.   Cystic peritoneal disease, including the dominant lesion along the posterior right hepatic lobe, is mildly progressed.   Subcapsular lesion along the posterior right hepatic lobe is unchanged.   03/2018 Imaging   MRI A/P: Mixed response of individual peritoneal metastases in the pelvis, as described above. Overall, there has been no significant change in bulk of disease.   Stable cystic peritoneal metastatic disease along the capsular surfaces of the liver and spleen.   No new sites of metastatic disease identified within the abdomen or pelvis.   08/2018 Imaging   MRI A/P: Status post hysterectomy and bilateral salpingo-oophorectomy.   Mixed cystic/solid  peritoneal implants in the abdomen/pelvis, as above. Dominant cystic implant along the posterior liver surface is mildly increased. Remaining lesions are overall grossly unchanged.   No new lesions are identified.   01/28/2019 Imaging   Mri A/P: 1. Relatively similar appearance of peritoneal metastasis. A posterior right  hepatic capsular based lesion is similar to minimally decreased in size. Left pelvic implants are primarily similar with possible enlargement of an anterior high left pelvic cystic implant. No new disease identified. 2.  Aortic Atherosclerosis (ICD10-I70.0). 3. Hepatic steatosis. 4. Left adrenal adenoma.   06/02/2019 Imaging   MRI 1. Potential slight enlargement of dominant cystic area and solid component, associated with rind like signal variation on T2 along the inferior right hepatic margin, also potentially slightly increased. Findings may still be within the realm of measurement and technical variation. Close attention on follow-up. 2. Subtle cystic changes along the cephalad margin of the spleen are difficult to see on previous imaging, perhaps new compared with prior imaging studies. 3. Pelvic implants and left lower quadrant lesion with similar size, of the area along the left iliac vasculature may be slightly larger than on the prior study. 4. Signs of extensive retroperitoneal and pelvic lymphadenectomy. 5. Hepatic steatosis. 6. 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     06/08/2019 Cancer Staging   Staging form: [place], AJCC 7th Edition - Clinical: Stage IIIC (rT2, N1, M0) - Signed by Heath Lark, MD on 06/08/2019   06/10/2019 Imaging   1. 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 These findings are slightly worsened in comparison to a remote prior CT examination dated 10/04/2014.   2.  No evidence of metastatic disease in the chest.   3. Status post hysterectomy, pelvic and retroperitoneal lymph node dissection, and ventral hernia mesh repair.   4.  Hepatic steatosis.   5.  Aortic Atherosclerosis (ICD10-I70.0).   06/24/2019 -  Chemotherapy   The patient  had carboplatin for chemotherapy treatment.       REVIEW OF SYSTEMS:   Constitutional: Denies fevers, chills or abnormal weight loss Eyes: Denies blurriness of vision Ears, nose, mouth, throat, and face: Denies mucositis or sore throat Respiratory: Denies cough, dyspnea or wheezes Cardiovascular: Denies palpitation, chest discomfort or lower extremity swelling Gastrointestinal:  Denies nausea, heartburn or change in bowel habits Lymphatics: Denies new lymphadenopathy or easy bruising Neurological:Denies numbness, tingling or new weaknesses Behavioral/Psych: Mood is stable, no new changes  All other systems were reviewed with the patient and are negative.  I have reviewed the past medical history, past surgical history, social history and family history with the patient and they are unchanged from previous note.  ALLERGIES:  is allergic to codeine; oxycodone; sodium thiosalicylate; ciprofloxacin; codone [hydrocodone bitartrate]; daypro [oxaprozin]; levofloxacin; stadol [butorphanol tartrate]; and sulfa antibiotics.  MEDICATIONS:  Current Outpatient Medications  Medication Sig Dispense Refill  . Biotin 10 MG CAPS Take by mouth.    . Calcium Citrate 200 MG TABS Take by mouth.    . CARBOplatin 1000 MG/100ML SOLN Inject into the vein.    . cephALEXin (KEFLEX) 500 MG capsule Take 1 capsule (500 mg total) by mouth 2 (two) times daily. 14 capsule 0  . escitalopram (LEXAPRO) 20 MG tablet Take 10 mg by mouth daily.     . metFORMIN (GLUCOPHAGE) 1000  MG tablet Take 1 tablet (1,000 mg total) by mouth 2 (two) times daily with a meal. 60 tablet 0  . Multiple Vitamin (MULTIVITAMIN) capsule Take 1 capsule by mouth daily.    . Naltrexone-buPROPion HCl ER (CONTRAVE) 8-90 MG TB12 Take 2 tablets by mouth daily.    . naproxen (NAPROSYN) 500 MG tablet Take 500 mg by mouth 2 (two) times daily with a meal.    . omeprazole (PRILOSEC) 20 MG capsule Take 20 mg by mouth daily.     . ondansetron (ZOFRAN) 8 MG  tablet Take 1 tablet (8 mg total) by mouth every 8 (eight) hours as needed for refractory nausea / vomiting. Start on day 3 after carboplatin chemo. 30 tablet 1  . Probiotic Product (ALIGN PO) Take 1 tablet by mouth daily.     . prochlorperazine (COMPAZINE) 10 MG tablet Take 1 tablet (10 mg total) by mouth every 6 (six) hours as needed (Nausea or vomiting). 30 tablet 1  . Vitamin D, Ergocalciferol, (DRISDOL) 1.25 MG (50000 UNIT) CAPS capsule Take 1 capsule (50,000 Units total) by mouth every 7 (seven) days. 4 capsule 0   No current facility-administered medications for this visit.    PHYSICAL EXAMINATION: ECOG PERFORMANCE STATUS: 1 - Symptomatic but completely ambulatory  Vitals:   07/26/19 1231  BP: (!) 122/51  Pulse: (!) 57  Resp: 18  SpO2: 99%   Filed Weights   07/26/19 1231  Weight: 193 lb 12.8 oz (87.9 kg)    GENERAL:alert, no distress and comfortable SKIN: Noted soft tissue infection at the site of prior IV site. NEURO: alert & oriented x 3 with fluent speech, no focal motor/sensory deficits  LABORATORY DATA:  I have reviewed the data as listed    Component Value Date/Time   NA 141 07/14/2019 1515   NA 141 05/31/2019 0910   NA 141 09/24/2013 1131   K 3.8 07/14/2019 1515   K 4.5 09/24/2013 1131   CL 103 07/14/2019 1515   CO2 29 07/14/2019 1515   CO2 24 09/24/2013 1131   GLUCOSE 102 (H) 07/14/2019 1515   GLUCOSE 90 09/24/2013 1131   BUN 22 07/14/2019 1515   BUN 18 05/31/2019 0910   BUN 21.2 09/24/2013 1131   CREATININE 1.12 (H) 07/14/2019 1515   CREATININE 1.18 (H) 09/02/2018 0917   CREATININE 1.1 09/24/2013 1131   CALCIUM 9.2 07/14/2019 1515   CALCIUM 9.5 09/24/2013 1131   PROT 6.6 07/14/2019 1515   PROT 6.6 05/31/2019 0910   PROT 6.8 09/24/2013 1131   ALBUMIN 3.8 07/14/2019 1515   ALBUMIN 4.5 05/31/2019 0910   ALBUMIN 3.9 09/24/2013 1131   AST 19 07/14/2019 1515   AST 23 09/24/2013 1131   ALT 23 07/14/2019 1515   ALT 27 09/24/2013 1131   ALKPHOS 68  07/14/2019 1515   ALKPHOS 81 09/24/2013 1131   BILITOT 0.4 07/14/2019 1515   BILITOT 0.3 05/31/2019 0910   BILITOT 0.53 09/24/2013 1131   GFRNONAA 53 (L) 07/14/2019 1515   GFRNONAA 50 (L) 09/02/2018 0917   GFRAA >60 07/14/2019 1515   GFRAA 58 (L) 09/02/2018 0917    No results found for: SPEP, UPEP  Lab Results  Component Value Date   WBC 4.2 07/14/2019   NEUTROABS 1.8 07/14/2019   HGB 12.9 07/14/2019   HCT 37.8 07/14/2019   MCV 91.5 07/14/2019   PLT 156 07/14/2019      Chemistry      Component Value Date/Time   NA 141 07/14/2019 1515  NA 141 05/31/2019 0910   NA 141 09/24/2013 1131   K 3.8 07/14/2019 1515   K 4.5 09/24/2013 1131   CL 103 07/14/2019 1515   CO2 29 07/14/2019 1515   CO2 24 09/24/2013 1131   BUN 22 07/14/2019 1515   BUN 18 05/31/2019 0910   BUN 21.2 09/24/2013 1131   CREATININE 1.12 (H) 07/14/2019 1515   CREATININE 1.18 (H) 09/02/2018 0917   CREATININE 1.1 09/24/2013 1131      Component Value Date/Time   CALCIUM 9.2 07/14/2019 1515   CALCIUM 9.5 09/24/2013 1131   ALKPHOS 68 07/14/2019 1515   ALKPHOS 81 09/24/2013 1131   AST 19 07/14/2019 1515   AST 23 09/24/2013 1131   ALT 23 07/14/2019 1515   ALT 27 09/24/2013 1131   BILITOT 0.4 07/14/2019 1515   BILITOT 0.3 05/31/2019 0910   BILITOT 0.53 09/24/2013 1131

## 2019-07-29 ENCOUNTER — Telehealth: Payer: Self-pay

## 2019-07-29 ENCOUNTER — Other Ambulatory Visit: Payer: Self-pay

## 2019-07-29 ENCOUNTER — Inpatient Hospital Stay: Payer: 59

## 2019-07-29 DIAGNOSIS — C569 Malignant neoplasm of unspecified ovary: Secondary | ICD-10-CM | POA: Diagnosis not present

## 2019-07-29 DIAGNOSIS — C786 Secondary malignant neoplasm of retroperitoneum and peritoneum: Secondary | ICD-10-CM | POA: Diagnosis not present

## 2019-07-29 DIAGNOSIS — N183 Chronic kidney disease, stage 3 unspecified: Secondary | ICD-10-CM | POA: Diagnosis not present

## 2019-07-29 DIAGNOSIS — J029 Acute pharyngitis, unspecified: Secondary | ICD-10-CM | POA: Diagnosis not present

## 2019-07-29 DIAGNOSIS — Z5111 Encounter for antineoplastic chemotherapy: Secondary | ICD-10-CM | POA: Diagnosis not present

## 2019-07-29 DIAGNOSIS — D61818 Other pancytopenia: Secondary | ICD-10-CM | POA: Diagnosis not present

## 2019-07-29 DIAGNOSIS — L0390 Cellulitis, unspecified: Secondary | ICD-10-CM | POA: Diagnosis not present

## 2019-07-29 DIAGNOSIS — Z7189 Other specified counseling: Secondary | ICD-10-CM

## 2019-07-29 LAB — CBC WITH DIFFERENTIAL (CANCER CENTER ONLY)
Abs Immature Granulocytes: 0 10*3/uL (ref 0.00–0.07)
Basophils Absolute: 0 10*3/uL (ref 0.0–0.1)
Basophils Relative: 1 %
Eosinophils Absolute: 0 10*3/uL (ref 0.0–0.5)
Eosinophils Relative: 1 %
HCT: 36 % (ref 36.0–46.0)
Hemoglobin: 12.4 g/dL (ref 12.0–15.0)
Immature Granulocytes: 0 %
Lymphocytes Relative: 48 %
Lymphs Abs: 1.7 10*3/uL (ref 0.7–4.0)
MCH: 31.7 pg (ref 26.0–34.0)
MCHC: 34.4 g/dL (ref 30.0–36.0)
MCV: 92.1 fL (ref 80.0–100.0)
Monocytes Absolute: 0.4 10*3/uL (ref 0.1–1.0)
Monocytes Relative: 13 %
Neutro Abs: 1.3 10*3/uL — ABNORMAL LOW (ref 1.7–7.7)
Neutrophils Relative %: 37 %
Platelet Count: 156 10*3/uL (ref 150–400)
RBC: 3.91 MIL/uL (ref 3.87–5.11)
RDW: 12.8 % (ref 11.5–15.5)
WBC Count: 3.4 10*3/uL — ABNORMAL LOW (ref 4.0–10.5)
nRBC: 0 % (ref 0.0–0.2)

## 2019-07-29 NOTE — Telephone Encounter (Signed)
Called back and scheduled lab appt at 1230 and given below message. She verbalized understanding.

## 2019-07-29 NOTE — Telephone Encounter (Signed)
-----   Message from Heath Lark, MD sent at 07/29/2019  9:00 AM EDT ----- Regarding: RE: can you call and ask how is her arm looks like now with a few days of antibiotics? Yes Can she stop by just for me to look at it? No appt needed, maybe we can wait until after CBC is done ----- Message ----- From: Flo Shanks, RN Sent: 07/29/2019   8:51 AM EDT To: Heath Lark, MD Subject: RE: can you call and ask how is her arm look#  She just called. The arm cellulitis has extended past the area that was circled on Monday appt. She thinks that is may be a little better. She is still having pain in that area. She is tired and exhausted. She is at work today. She is asking if you would do a CBC today? ----- Message ----- From: Heath Lark, MD Sent: 07/29/2019   8:02 AM EDT To: Flo Shanks, RN Subject: can you call and ask how is her arm looks li#

## 2019-07-30 NOTE — Progress Notes (Signed)
Pharmacist Chemotherapy Monitoring - Follow Up Assessment    I verify that I have reviewed each item in the below checklist:  . Regimen for the patient is scheduled for the appropriate day and plan matches scheduled date. Marland Kitchen Appropriate non-routine labs are ordered dependent on drug ordered. . If applicable, additional medications reviewed and ordered per protocol based on lifetime cumulative doses and/or treatment regimen.   Plan for follow-up and/or issues identified: No . I-vent associated with next due treatment: No [name] D 07/30/2019 3:09 PM

## 2019-08-04 ENCOUNTER — Other Ambulatory Visit: Payer: Self-pay

## 2019-08-04 ENCOUNTER — Inpatient Hospital Stay: Payer: 59

## 2019-08-04 DIAGNOSIS — Z5111 Encounter for antineoplastic chemotherapy: Secondary | ICD-10-CM | POA: Diagnosis not present

## 2019-08-04 DIAGNOSIS — C569 Malignant neoplasm of unspecified ovary: Secondary | ICD-10-CM

## 2019-08-04 DIAGNOSIS — D61818 Other pancytopenia: Secondary | ICD-10-CM | POA: Diagnosis not present

## 2019-08-04 DIAGNOSIS — C786 Secondary malignant neoplasm of retroperitoneum and peritoneum: Secondary | ICD-10-CM | POA: Diagnosis not present

## 2019-08-04 DIAGNOSIS — L0390 Cellulitis, unspecified: Secondary | ICD-10-CM | POA: Diagnosis not present

## 2019-08-04 DIAGNOSIS — N183 Chronic kidney disease, stage 3 unspecified: Secondary | ICD-10-CM | POA: Diagnosis not present

## 2019-08-04 DIAGNOSIS — Z7189 Other specified counseling: Secondary | ICD-10-CM

## 2019-08-04 DIAGNOSIS — M793 Panniculitis, unspecified: Secondary | ICD-10-CM | POA: Diagnosis not present

## 2019-08-04 DIAGNOSIS — L603 Nail dystrophy: Secondary | ICD-10-CM | POA: Diagnosis not present

## 2019-08-04 DIAGNOSIS — J029 Acute pharyngitis, unspecified: Secondary | ICD-10-CM | POA: Diagnosis not present

## 2019-08-04 LAB — CBC WITH DIFFERENTIAL (CANCER CENTER ONLY)
Abs Immature Granulocytes: 0.01 10*3/uL (ref 0.00–0.07)
Basophils Absolute: 0 10*3/uL (ref 0.0–0.1)
Basophils Relative: 1 %
Eosinophils Absolute: 0 10*3/uL (ref 0.0–0.5)
Eosinophils Relative: 1 %
HCT: 35.6 % — ABNORMAL LOW (ref 36.0–46.0)
Hemoglobin: 12.3 g/dL (ref 12.0–15.0)
Immature Granulocytes: 0 %
Lymphocytes Relative: 40 %
Lymphs Abs: 1.7 10*3/uL (ref 0.7–4.0)
MCH: 31.9 pg (ref 26.0–34.0)
MCHC: 34.6 g/dL (ref 30.0–36.0)
MCV: 92.2 fL (ref 80.0–100.0)
Monocytes Absolute: 0.4 10*3/uL (ref 0.1–1.0)
Monocytes Relative: 9 %
Neutro Abs: 2 10*3/uL (ref 1.7–7.7)
Neutrophils Relative %: 49 %
Platelet Count: 111 10*3/uL — ABNORMAL LOW (ref 150–400)
RBC: 3.86 MIL/uL — ABNORMAL LOW (ref 3.87–5.11)
RDW: 13.8 % (ref 11.5–15.5)
WBC Count: 4.1 10*3/uL (ref 4.0–10.5)
nRBC: 0 % (ref 0.0–0.2)

## 2019-08-04 LAB — COMPREHENSIVE METABOLIC PANEL
ALT: 18 U/L (ref 0–44)
AST: 16 U/L (ref 15–41)
Albumin: 3.8 g/dL (ref 3.5–5.0)
Alkaline Phosphatase: 85 U/L (ref 38–126)
Anion gap: 8 (ref 5–15)
BUN: 18 mg/dL (ref 8–23)
CO2: 26 mmol/L (ref 22–32)
Calcium: 9.2 mg/dL (ref 8.9–10.3)
Chloride: 105 mmol/L (ref 98–111)
Creatinine, Ser: 1.01 mg/dL — ABNORMAL HIGH (ref 0.44–1.00)
GFR calc Af Amer: >60 mL/min (ref >60)
GFR calc non Af Amer: 60 mL/min — ABNORMAL LOW (ref >60)
Glucose, Bld: 109 mg/dL — ABNORMAL HIGH (ref 70–99)
Potassium: 4.4 mmol/L (ref 3.5–5.1)
Sodium: 139 mmol/L (ref 135–145)
Total Bilirubin: 0.4 mg/dL (ref 0.3–1.2)
Total Protein: 7 g/dL (ref 6.5–8.1)

## 2019-08-04 MED FILL — TRETINOIN 0.025% CREAM: 0.025 | 20 days supply | Qty: 20 | Fill #0

## 2019-08-04 MED FILL — Fosaprepitant Dimeglumine For IV Infusion 150 MG (Base Eq): INTRAVENOUS | Qty: 5 | Status: AC

## 2019-08-04 MED FILL — Dexamethasone Sodium Phosphate Inj 100 MG/10ML: INTRAMUSCULAR | Qty: 1 | Status: AC

## 2019-08-05 ENCOUNTER — Inpatient Hospital Stay: Payer: 59

## 2019-08-05 ENCOUNTER — Encounter: Payer: Self-pay | Admitting: Hematology and Oncology

## 2019-08-05 ENCOUNTER — Inpatient Hospital Stay (HOSPITAL_BASED_OUTPATIENT_CLINIC_OR_DEPARTMENT_OTHER): Payer: 59 | Admitting: Hematology and Oncology

## 2019-08-05 ENCOUNTER — Other Ambulatory Visit: Payer: Self-pay

## 2019-08-05 ENCOUNTER — Telehealth: Payer: Self-pay | Admitting: Hematology and Oncology

## 2019-08-05 ENCOUNTER — Ambulatory Visit (INDEPENDENT_AMBULATORY_CARE_PROVIDER_SITE_OTHER): Payer: 59 | Admitting: Physician Assistant

## 2019-08-05 DIAGNOSIS — L0390 Cellulitis, unspecified: Secondary | ICD-10-CM | POA: Diagnosis not present

## 2019-08-05 DIAGNOSIS — N183 Chronic kidney disease, stage 3 unspecified: Secondary | ICD-10-CM | POA: Diagnosis not present

## 2019-08-05 DIAGNOSIS — J029 Acute pharyngitis, unspecified: Secondary | ICD-10-CM | POA: Diagnosis not present

## 2019-08-05 DIAGNOSIS — D61818 Other pancytopenia: Secondary | ICD-10-CM | POA: Insufficient documentation

## 2019-08-05 DIAGNOSIS — C569 Malignant neoplasm of unspecified ovary: Secondary | ICD-10-CM | POA: Diagnosis not present

## 2019-08-05 NOTE — Progress Notes (Signed)
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[facility]) She is not feeling well today We will cancel her treatment I suggest her to be evaluated to rule out Covid infection Her CBC show no evidence of leukocytosis It certainly could be a call of viral in nature We will hold off rescheduling her treatment until further notice  Acute sore throat Due to the acute sore throat situation, I will cancel her treatment today As the patient has not been vaccinated against COVID-19, I recommend her to get evaluated as soon as possible We will reschedule her treatment next week  Cellulitis The cellulitis has resolved but when she saw her dermatologist recently, he thought that she might have panniculitis I do not know how to treat this and recommend she contact her dermatologist for further evaluation and suggestion of treatment if needed  Pancytopenia, acquired [facility]) She has mild occasional intermittent leukopenia or thrombocytopenia We will observe only for now  CKD (chronic kidney disease), stage III She has intermittent fluctuation of creatinine function We will adjust her carboplatin dose accordingly Recommend against taking too much NSAID   No orders of the defined types were placed in this encounter.   All questions were answered. The patient knows to call the clinic with any problems, questions or concerns. The total time spent in the appointment was 25 minutes encounter with patients including review of chart and various tests results, discussions about plan of care and coordination of care plan   Heath Lark, MD 08/05/2019 10:54 AM  INTERVAL HISTORY: Please see below for problem oriented charting. She returns for further follow-up and chemotherapy Her cellulitis has resolved When she saw her dermatologist recently for an unrelated issue, he  commented that she might have panniculitis but did not offer any suggestions on treatment to her She did not feel well today She woke up with a sore throat this morning She denies cough, fever or chills Denies abdominal pain no nausea from recent therapy  SUMMARY OF ONCOLOGIC HISTORY: Oncology History Overview Note  2018 - Core biopsy for ER/PR and Foundation One testing performed. Unfortunately, no sufficient tissue for Foundation One testing. ER was 50% and PR 90%.  AM[facility]: 06/23/19: 108 05/26/19: 9.04 01/05/19: 6.84 09/02/18: 3.72 06/01/18: 3.84 02/24/18: 2.6 11/21/17: 3.26 08/26/17: 1.77 05/27/17: 2.12 01/28/17: 2.47 06/10/16: 2.68 02/06/16: 1.84 05/12/15: 3.27 10/03/14: 2.12  Inhibin B 05/26/19: 90.2 01/05/19: 92 09/02/18: 70.9 06/04/18: 70 02/24/18: 79.5 11/21/17: 85.8 08/26/17: 65.6 05/27/17: 58.9 01/28/17: 198 06/10/16: 118.1 02/06/16: 62.4 05/12/15: 64.6 10/03/14: 58   Malignant granulosa cell tumor of ovary ([facility])  1994 Initial Diagnosis   1994   2002 Relapse/Recurrence   Upper abdominal recurrence, resected.     - 09/2000 Chemotherapy   6 cycles of IP cisplatin and etoposide    02/2009 Relapse/Recurrence   CT showed increased size of nodules in pelvis   2010 Surgery   Exlap with section of tumor nodules near cecum and left pelvic sidewall. Tumor: ER negative, PR positive    Treatment Plan Change   Alternated 2 week courses of Megace and Tamoxifen - ended 03/2012   03/2012 PET scan   CT - progressive disease   2013 Treatment Plan Change   Letrozole   01/2016 Imaging   MRI showed progressive disease   2017 Treatment Plan Change   Two weeks of alternating Tamoxifen 20mg  daily and  then Megace 40mg  TID   08/2016 Imaging   MRI showed progressive disease with peritoneal implants near liver, in pelvis   01/2017 Treatment Plan Change   Lupron 11.25 q 3 months   11/2017 Imaging   Overall mixed response.   Mixed cystic/solid lesions in the left pelvis are mildly improved.    Cystic peritoneal disease, including the dominant lesion along the posterior right hepatic lobe, is mildly progressed.   Subcapsular lesion along the posterior right hepatic lobe is unchanged.   03/2018 Imaging   MRI A/P: Mixed response of individual peritoneal metastases in the pelvis, as described above. Overall, there has been no significant change in bulk of disease.   Stable cystic peritoneal metastatic disease along the capsular surfaces of the liver and spleen.   No new sites of metastatic disease identified within the abdomen or pelvis.   08/2018 Imaging   MRI A/P: Status post hysterectomy and bilateral salpingo-oophorectomy.   Mixed cystic/solid peritoneal implants in the abdomen/pelvis, as above. Dominant cystic implant along the posterior liver surface is mildly increased. Remaining lesions are overall grossly unchanged.   No new lesions are identified.   01/28/2019 Imaging   Mri A/P: 1. Relatively similar appearance of peritoneal metastasis. A posterior right hepatic capsular based lesion is similar to minimally decreased in size. Left pelvic implants are primarily similar with possible enlargement of an anterior high left pelvic cystic implant. No new disease identified. 2.  Aortic Atherosclerosis (ICD10-I70.0). 3. Hepatic steatosis. 4. Left adrenal adenoma.   06/02/2019 Imaging   MRI 1. Potential slight enlargement of dominant cystic area and solid component, associated with rind like signal variation on T2 along the inferior right hepatic margin, also potentially slightly increased. Findings may still be within the realm of measurement and technical variation. Close attention on follow-up. 2. Subtle cystic changes along the cephalad margin of the spleen are difficult to see on previous imaging, perhaps new compared with prior imaging studies. 3. Pelvic implants and left lower quadrant lesion with similar size, of the area along the left iliac vasculature may be slightly  larger than on the prior study. 4. Signs of extensive retroperitoneal and pelvic lymphadenectomy. 5. Hepatic steatosis. 6. 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     06/08/2019 Cancer Staging   Staging form: Ovary, AJCC 7th Edition - Clinical: Stage IIIC (rT2, N1, M0) - Signed by Heath Lark, MD on 06/08/2019   06/10/2019 Imaging   1. 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 These findings are slightly worsened in comparison to a remote prior CT examination dated 10/04/2014.   2.  No evidence of metastatic disease in the chest.   3. Status post hysterectomy, pelvic and retroperitoneal lymph node dissection, and ventral hernia mesh repair.   4.  Hepatic steatosis.   5.  Aortic Atherosclerosis (ICD10-I70.0).   06/24/2019 -  Chemotherapy   The patient had carboplatin for chemotherapy treatment.       REVIEW OF SYSTEMS:   Constitutional: Denies fevers, chills or abnormal weight loss Eyes: Denies blurriness of vision Respiratory: Denies cough, dyspnea or wheezes Cardiovascular: Denies palpitation, chest discomfort or lower extremity swelling Gastrointestinal:  Denies nausea, heartburn or change in bowel habits Lymphatics: Denies new lymphadenopathy or easy bruising Neurological:Denies numbness, tingling or new weaknesses Behavioral/Psych: Mood is stable,  no new changes  All other systems were reviewed with the patient and are negative.  I have reviewed the past medical history, past surgical history, social history and family history with the patient and they are unchanged from previous note.  ALLERGIES:  is allergic to codeine; oxycodone; sodium thiosalicylate; ciprofloxacin; codone [hydrocodone bitartrate]; daypro [oxaprozin]; levofloxacin; stadol  [butorphanol tartrate]; and sulfa antibiotics.  MEDICATIONS:  Current Outpatient Medications  Medication Sig Dispense Refill  . Biotin 10 MG CAPS Take by mouth.    . Calcium Citrate 200 MG TABS Take by mouth.    . CARBOplatin 1000 MG/100ML SOLN Inject into the vein.    . cephALEXin (KEFLEX) 500 MG capsule Take 1 capsule (500 mg total) by mouth 2 (two) times daily. 14 capsule 0  . escitalopram (LEXAPRO) 20 MG tablet Take 10 mg by mouth daily.     . metFORMIN (GLUCOPHAGE) 1000 MG tablet Take 1 tablet (1,000 mg total) by mouth 2 (two) times daily with a meal. 60 tablet 0  . Multiple Vitamin (MULTIVITAMIN) capsule Take 1 capsule by mouth daily.    . Naltrexone-buPROPion HCl ER (CONTRAVE) 8-90 MG TB12 Take 2 tablets by mouth daily.    . naproxen (NAPROSYN) 500 MG tablet Take 500 mg by mouth 2 (two) times daily with a meal.    . omeprazole (PRILOSEC) 20 MG capsule Take 20 mg by mouth daily.     . ondansetron (ZOFRAN) 8 MG tablet Take 1 tablet (8 mg total) by mouth every 8 (eight) hours as needed for refractory nausea / vomiting. Start on day 3 after carboplatin chemo. 30 tablet 1  . Probiotic Product (ALIGN PO) Take 1 tablet by mouth daily.     . prochlorperazine (COMPAZINE) 10 MG tablet Take 1 tablet (10 mg total) by mouth every 6 (six) hours as needed (Nausea or vomiting). 30 tablet 1  . Vitamin D, Ergocalciferol, (DRISDOL) 1.25 MG (50000 UNIT) CAPS capsule Take 1 capsule (50,000 Units total) by mouth every 7 (seven) days. 4 capsule 0   No current facility-administered medications for this visit.    PHYSICAL EXAMINATION: ECOG PERFORMANCE STATUS: 1 - Symptomatic but completely ambulatory  Vitals:   08/05/19 0841  BP: 123/63  Pulse: 64  Resp: 17  Temp: 98 F (36.7 C)  SpO2: 100%   Filed Weights   08/05/19 0841  Weight: 197 lb (89.4 kg)    GENERAL:alert, no distress and comfortable Previous area of cellulitis is resolved NEURO: alert & oriented x 3 with fluent speech, no focal  motor/sensory deficits  LABORATORY DATA:  I have reviewed the data as listed    Component Value Date/Time   NA 139 08/04/2019 1145   NA 141 05/31/2019 0910   NA 141 09/24/2013 1131   K 4.4 08/04/2019 1145   K 4.5 09/24/2013 1131   CL 105 08/04/2019 1145   CO2 26 08/04/2019 1145   CO2 24 09/24/2013 1131   GLUCOSE 109 (H) 08/04/2019 1145   GLUCOSE 90 09/24/2013 1131   BUN 18 08/04/2019 1145   BUN 18 05/31/2019 0910   BUN 21.2 09/24/2013 1131   CREATININE 1.01 (H) 08/04/2019 1145   CREATININE 1.18 (H) 09/02/2018 0917   CREATININE 1.1 09/24/2013 1131   CALCIUM 9.2 08/04/2019 1145   CALCIUM 9.5 09/24/2013 1131   PROT 7.0 08/04/2019 1145   PROT 6.6 05/31/2019 0910   PROT 6.8 09/24/2013 1131   ALBUMIN 3.8 08/04/2019 1145   ALBUMIN 4.5 05/31/2019 0910   ALBUMIN 3.9 09/24/2013 1131  AST 16 08/04/2019 1145   AST 23 09/24/2013 1131   ALT 18 08/04/2019 1145   ALT 27 09/24/2013 1131   ALKPHOS 85 08/04/2019 1145   ALKPHOS 81 09/24/2013 1131   BILITOT 0.4 08/04/2019 1145   BILITOT 0.3 05/31/2019 0910   BILITOT 0.53 09/24/2013 1131   GFRNONAA 60 (L) 08/04/2019 1145   GFRNONAA 50 (L) 09/02/2018 0917   GFRAA >60 08/04/2019 1145   GFRAA 58 (L) 09/02/2018 0917    No results found for: SPEP, UPEP  Lab Results  Component Value Date   WBC 4.1 08/04/2019   NEUTROABS 2.0 08/04/2019   HGB 12.3 08/04/2019   HCT 35.6 (L) 08/04/2019   MCV 92.2 08/04/2019   PLT 111 (L) 08/04/2019      Chemistry      Component Value Date/Time   NA 139 08/04/2019 1145   NA 141 05/31/2019 0910   NA 141 09/24/2013 1131   K 4.4 08/04/2019 1145   K 4.5 09/24/2013 1131   CL 105 08/04/2019 1145   CO2 26 08/04/2019 1145   CO2 24 09/24/2013 1131   BUN 18 08/04/2019 1145   BUN 18 05/31/2019 0910   BUN 21.2 09/24/2013 1131   CREATININE 1.01 (H) 08/04/2019 1145   CREATININE 1.18 (H) 09/02/2018 0917   CREATININE 1.1 09/24/2013 1131      Component Value Date/Time   CALCIUM 9.2 08/04/2019 1145    CALCIUM 9.5 09/24/2013 1131   ALKPHOS 85 08/04/2019 1145   ALKPHOS 81 09/24/2013 1131   AST 16 08/04/2019 1145   AST 23 09/24/2013 1131   ALT 18 08/04/2019 1145   ALT 27 09/24/2013 1131   BILITOT 0.4 08/04/2019 1145   BILITOT 0.3 05/31/2019 0910   BILITOT 0.53 09/24/2013 1131

## 2019-08-05 NOTE — Assessment & Plan Note (Signed)
She has mild occasional intermittent leukopenia or thrombocytopenia We will observe only for now

## 2019-08-05 NOTE — Telephone Encounter (Signed)
No 4/15 los/sch msg. No changes made to pt's schedule.  

## 2019-08-05 NOTE — Assessment & Plan Note (Signed)
The cellulitis has resolved but when she saw her dermatologist recently, he thought that she might have panniculitis I do not know how to treat this and recommend she contact her dermatologist for further evaluation and suggestion of treatment if needed

## 2019-08-05 NOTE — Assessment & Plan Note (Signed)
She is not feeling well today We will cancel her treatment I suggest her to be evaluated to rule out Covid infection Her CBC show no evidence of leukocytosis It certainly could be a call of viral in nature We will hold off rescheduling her treatment until further notice

## 2019-08-05 NOTE — Assessment & Plan Note (Signed)
She has intermittent fluctuation of creatinine function We will adjust her carboplatin dose accordingly Recommend against taking too much NSAID

## 2019-08-05 NOTE — Assessment & Plan Note (Signed)
Due to the acute sore throat situation, I will cancel her treatment today As the patient has not been vaccinated against COVID-19, I recommend her to get evaluated as soon as possible We will reschedule her treatment next week

## 2019-08-06 ENCOUNTER — Telehealth: Payer: Self-pay

## 2019-08-06 ENCOUNTER — Other Ambulatory Visit: Payer: Self-pay | Admitting: Hematology and Oncology

## 2019-08-06 MED ORDER — CEPHALEXIN 500 MG PO CAPS: 500.0000 mg | ORAL_CAPSULE | Freq: Two times a day (BID) | ORAL | 0 refills | Status: DC

## 2019-08-06 NOTE — Telephone Encounter (Signed)
I refilled it

## 2019-08-06 NOTE — Telephone Encounter (Signed)
She called. COVID test is negative.

## 2019-08-06 NOTE — Telephone Encounter (Signed)
She called, asking for Keflex Rx to help with the places on her arms. The antibiotic that she has recently helped with the place. She is trying to get appt with dermatologist. Pharmacy is [facility] in [place]. She is still waiting on COVID test results and will call back with results. She still is complaining of a sore throat, nasal congestion and cough. She feels like it is a bad cold.

## 2019-08-06 NOTE — Telephone Encounter (Signed)
Since I am prescribing keflex for her veins, we can give her a bit of break I suggest resume chemo next Thursday if she agrees Let me know and I will put in chemo orders and scehduling

## 2019-08-06 NOTE — Telephone Encounter (Signed)
Called and given below message. She verbalized understanding. 

## 2019-08-06 NOTE — Telephone Encounter (Signed)
Called and given below message. She verbalized understanding and agrees to resume chemo next Thursday.

## 2019-08-10 NOTE — Progress Notes (Signed)
Pharmacist Chemotherapy Monitoring - Follow Up Assessment    I verify that I have reviewed each item in the below checklist:  . Regimen for the patient is scheduled for the appropriate day and plan matches scheduled date. Marland Kitchen Appropriate non-routine labs are ordered dependent on drug ordered. . If applicable, additional medications reviewed and ordered per protocol based on lifetime cumulative doses and/or treatment regimen.   Plan for follow-up and/or issues identified: Yes . I-vent associated with next due treatment: Yes  . MD and/or nursing notified: No   Kennith Center, Pharm.D., CPP 08/10/2019@4 :32 PM

## 2019-08-11 ENCOUNTER — Other Ambulatory Visit: Payer: Self-pay

## 2019-08-11 ENCOUNTER — Inpatient Hospital Stay: Payer: 59

## 2019-08-11 DIAGNOSIS — C569 Malignant neoplasm of unspecified ovary: Secondary | ICD-10-CM

## 2019-08-11 DIAGNOSIS — J029 Acute pharyngitis, unspecified: Secondary | ICD-10-CM | POA: Diagnosis not present

## 2019-08-11 DIAGNOSIS — M793 Panniculitis, unspecified: Secondary | ICD-10-CM | POA: Diagnosis not present

## 2019-08-11 DIAGNOSIS — D61818 Other pancytopenia: Secondary | ICD-10-CM | POA: Diagnosis not present

## 2019-08-11 DIAGNOSIS — C786 Secondary malignant neoplasm of retroperitoneum and peritoneum: Secondary | ICD-10-CM | POA: Diagnosis not present

## 2019-08-11 DIAGNOSIS — N183 Chronic kidney disease, stage 3 unspecified: Secondary | ICD-10-CM | POA: Diagnosis not present

## 2019-08-11 DIAGNOSIS — Z5111 Encounter for antineoplastic chemotherapy: Secondary | ICD-10-CM | POA: Diagnosis not present

## 2019-08-11 DIAGNOSIS — Z7189 Other specified counseling: Secondary | ICD-10-CM

## 2019-08-11 DIAGNOSIS — L0390 Cellulitis, unspecified: Secondary | ICD-10-CM | POA: Diagnosis not present

## 2019-08-11 LAB — CBC WITH DIFFERENTIAL (CANCER CENTER ONLY)
Abs Immature Granulocytes: 0.01 10*3/uL (ref 0.00–0.07)
Basophils Absolute: 0 10*3/uL (ref 0.0–0.1)
Basophils Relative: 0 %
Eosinophils Absolute: 0.1 10*3/uL (ref 0.0–0.5)
Eosinophils Relative: 1 %
HCT: 35.1 % — ABNORMAL LOW (ref 36.0–46.0)
Hemoglobin: 11.9 g/dL — ABNORMAL LOW (ref 12.0–15.0)
Immature Granulocytes: 0 %
Lymphocytes Relative: 37 %
Lymphs Abs: 1.9 10*3/uL (ref 0.7–4.0)
MCH: 31.6 pg (ref 26.0–34.0)
MCHC: 33.9 g/dL (ref 30.0–36.0)
MCV: 93.4 fL (ref 80.0–100.0)
Monocytes Absolute: 0.8 10*3/uL (ref 0.1–1.0)
Monocytes Relative: 15 %
Neutro Abs: 2.4 10*3/uL (ref 1.7–7.7)
Neutrophils Relative %: 47 %
Platelet Count: 355 10*3/uL (ref 150–400)
RBC: 3.76 MIL/uL — ABNORMAL LOW (ref 3.87–5.11)
RDW: 14.5 % (ref 11.5–15.5)
WBC Count: 5.2 10*3/uL (ref 4.0–10.5)
nRBC: 0 % (ref 0.0–0.2)

## 2019-08-11 LAB — COMPREHENSIVE METABOLIC PANEL
ALT: 13 U/L (ref 0–44)
AST: 13 U/L — ABNORMAL LOW (ref 15–41)
Albumin: 3.4 g/dL — ABNORMAL LOW (ref 3.5–5.0)
Alkaline Phosphatase: 85 U/L (ref 38–126)
Anion gap: 11 (ref 5–15)
BUN: 23 mg/dL (ref 8–23)
CO2: 26 mmol/L (ref 22–32)
Calcium: 9.5 mg/dL (ref 8.9–10.3)
Chloride: 102 mmol/L (ref 98–111)
Creatinine, Ser: 1.08 mg/dL — ABNORMAL HIGH (ref 0.44–1.00)
GFR calc Af Amer: >60 mL/min (ref >60)
GFR calc non Af Amer: 55 mL/min — ABNORMAL LOW (ref >60)
Glucose, Bld: 88 mg/dL (ref 70–99)
Potassium: 4.6 mmol/L (ref 3.5–5.1)
Sodium: 139 mmol/L (ref 135–145)
Total Bilirubin: 0.3 mg/dL (ref 0.3–1.2)
Total Protein: 7.4 g/dL (ref 6.5–8.1)

## 2019-08-12 ENCOUNTER — Inpatient Hospital Stay (HOSPITAL_BASED_OUTPATIENT_CLINIC_OR_DEPARTMENT_OTHER): Payer: 59 | Admitting: Hematology and Oncology

## 2019-08-12 ENCOUNTER — Other Ambulatory Visit: Payer: Self-pay | Admitting: Hematology and Oncology

## 2019-08-12 ENCOUNTER — Encounter: Payer: Self-pay | Admitting: Hematology and Oncology

## 2019-08-12 ENCOUNTER — Inpatient Hospital Stay: Payer: 59

## 2019-08-12 ENCOUNTER — Other Ambulatory Visit: Payer: Self-pay

## 2019-08-12 DIAGNOSIS — Z7189 Other specified counseling: Secondary | ICD-10-CM

## 2019-08-12 DIAGNOSIS — D61818 Other pancytopenia: Secondary | ICD-10-CM | POA: Diagnosis not present

## 2019-08-12 DIAGNOSIS — I878 Other specified disorders of veins: Secondary | ICD-10-CM | POA: Diagnosis not present

## 2019-08-12 DIAGNOSIS — J029 Acute pharyngitis, unspecified: Secondary | ICD-10-CM | POA: Diagnosis not present

## 2019-08-12 DIAGNOSIS — L0390 Cellulitis, unspecified: Secondary | ICD-10-CM | POA: Diagnosis not present

## 2019-08-12 DIAGNOSIS — C569 Malignant neoplasm of unspecified ovary: Secondary | ICD-10-CM

## 2019-08-12 DIAGNOSIS — C786 Secondary malignant neoplasm of retroperitoneum and peritoneum: Secondary | ICD-10-CM | POA: Diagnosis not present

## 2019-08-12 DIAGNOSIS — N183 Chronic kidney disease, stage 3 unspecified: Secondary | ICD-10-CM | POA: Diagnosis not present

## 2019-08-12 DIAGNOSIS — Z5111 Encounter for antineoplastic chemotherapy: Secondary | ICD-10-CM | POA: Diagnosis not present

## 2019-08-12 MED ORDER — SODIUM CHLORIDE 0.9 % IV SOLN
10.0000 mg | Freq: Once | INTRAVENOUS | Status: AC
  Administered 2019-08-12: 10 mg via INTRAVENOUS
  Filled 2019-08-12: qty 10

## 2019-08-12 MED ORDER — SODIUM CHLORIDE 0.9 % IV SOLN
150.0000 mg | Freq: Once | INTRAVENOUS | Status: AC
  Administered 2019-08-12: 15:00:00 150 mg via INTRAVENOUS
  Filled 2019-08-12: qty 150

## 2019-08-12 MED ORDER — SODIUM CHLORIDE 0.9 % IV SOLN
499.0000 mg | Freq: Once | INTRAVENOUS | Status: AC
  Administered 2019-08-12: 500 mg via INTRAVENOUS
  Filled 2019-08-12: qty 50

## 2019-08-12 MED ORDER — PALONOSETRON HCL INJECTION 0.25 MG/5ML
INTRAVENOUS | Status: AC
  Filled 2019-08-12: qty 5

## 2019-08-12 MED ORDER — PALONOSETRON HCL INJECTION 0.25 MG/5ML
0.2500 mg | Freq: Once | INTRAVENOUS | Status: AC
  Administered 2019-08-12: 0.25 mg via INTRAVENOUS

## 2019-08-12 MED ORDER — SODIUM CHLORIDE 0.9 % IV SOLN
Freq: Once | INTRAVENOUS | Status: AC
  Filled 2019-08-12: qty 250

## 2019-08-12 NOTE — Assessment & Plan Note (Signed)
She has fully recovered from recent sore throat and her cold She underwent biopsy with dermatologist to evaluate for possible skin condition We will resume chemotherapy today I recommend minimum 4 cycles of treatment before repeat CT imaging So far, she tolerated treatment well except for some occasional constipation and skin changes at the IV insertion site

## 2019-08-12 NOTE — Assessment & Plan Note (Signed)
She has intermittent fluctuation of creatinine function We will adjust her carboplatin dose accordingly Recommend against taking too much NSAID

## 2019-08-12 NOTE — Progress Notes (Signed)
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[facility]) She has fully recovered from recent sore throat and her cold She underwent biopsy with dermatologist to evaluate for possible skin condition We will resume chemotherapy today I recommend minimum 4 cycles of treatment before repeat CT imaging So far, she tolerated treatment well except for some occasional constipation and skin changes at the IV insertion site  CKD (chronic kidney disease), stage III She has intermittent fluctuation of creatinine function We will adjust her carboplatin dose accordingly Recommend against taking too much NSAID  Poor venous access She has recurrent thrombophlebitis after each IV insertion for chemotherapy We discussed the risk and benefits of port placement in the future For now, we will prefer to hold off at least until after CT imaging is completed for assessment and evaluation of response to treatment   No orders of the defined types were placed in this encounter.   All questions were answered. The patient knows to call the clinic with any problems, questions or concerns. The total time spent in the appointment was 20 minutes encounter with patients including review of chart and various tests results, discussions about plan of care and coordination of care plan   Heath Lark, MD 08/12/2019 9:15 AM  INTERVAL HISTORY: Please see below for problem oriented charting. She is seen prior to cycle 3 of chemotherapy Her sore throat has healed Her previous IV insertion site has improved She underwent skin biopsy yesterday with dermatologist for evaluation of possible panniculitis She has some mild constipation of the chemo but otherwise doing well Denies abdominal pain, nausea or bloating  SUMMARY OF ONCOLOGIC HISTORY: Oncology History Overview Note  2018 - Core  biopsy for ER/PR and Foundation One testing performed. Unfortunately, no sufficient tissue for Foundation One testing. ER was 50% and PR 90%.  [facility]: 06/23/19: 108 05/26/19: 9.04 01/05/19: 6.84 09/02/18: 3.72 06/01/18: 3.84 02/24/18: 2.6 11/21/17: 3.26 08/26/17: 1.77 05/27/17: 2.12 01/28/17: 2.47 06/10/16: 2.68 02/06/16: 1.84 05/12/15: 3.27 10/03/14: 2.12  Inhibin B 05/26/19: 90.2 01/05/19: 92 09/02/18: 70.9 06/04/18: 70 02/24/18: 79.5 11/21/17: 85.8 08/26/17: 65.6 05/27/17: 58.9 01/28/17: 198 06/10/16: 118.1 02/06/16: 62.4 05/12/15: 64.6 10/03/14: 58   Malignant granulosa cell tumor of ovary ([place])  1994 Initial Diagnosis   1994   2002 Relapse/Recurrence   Upper abdominal recurrence, resected.     - 09/2000 Chemotherapy   6 cycles of IP cisplatin and etoposide    02/2009 Relapse/Recurrence   CT showed increased size of nodules in pelvis   2010 Surgery   Exlap with section of tumor nodules near cecum and left pelvic sidewall. Tumor: ER negative, PR positive    Treatment Plan Change   Alternated 2 week courses of Megace and Tamoxifen - ended 03/2012   03/2012 PET scan   CT - progressive disease   2013 Treatment Plan Change   Letrozole   01/2016 Imaging   MRI showed progressive disease   2017 Treatment Plan Change   Two weeks of alternating Tamoxifen 20mg  daily and then Megace 40mg  TID   08/2016 Imaging   MRI showed progressive disease with peritoneal implants near liver, in pelvis   01/2017 Treatment Plan Change   Lupron 11.25 q 3 months   11/2017 Imaging   Overall mixed response.   Mixed cystic/solid lesions in the left pelvis are mildly improved.   Cystic peritoneal disease, including the  dominant lesion along the posterior right hepatic lobe, is mildly progressed.   Subcapsular lesion along the posterior right hepatic lobe is unchanged.   03/2018 Imaging   MRI A/P: Mixed response of individual peritoneal metastases in the pelvis, as described above. Overall, there has  been no significant change in bulk of disease.   Stable cystic peritoneal metastatic disease along the capsular surfaces of the liver and spleen.   No new sites of metastatic disease identified within the abdomen or pelvis.   08/2018 Imaging   MRI A/P: Status post hysterectomy and bilateral salpingo-oophorectomy.   Mixed cystic/solid peritoneal implants in the abdomen/pelvis, as above. Dominant cystic implant along the posterior liver surface is mildly increased. Remaining lesions are overall grossly unchanged.   No new lesions are identified.   01/28/2019 Imaging   Mri A/P: 1. Relatively similar appearance of peritoneal metastasis. A posterior right hepatic capsular based lesion is similar to minimally decreased in size. Left pelvic implants are primarily similar with possible enlargement of an anterior high left pelvic cystic implant. No new disease identified. 2.  Aortic Atherosclerosis (ICD10-I70.0). 3. Hepatic steatosis. 4. Left adrenal adenoma.   06/02/2019 Imaging   MRI 1. Potential slight enlargement of dominant cystic area and solid component, associated with rind like signal variation on T2 along the inferior right hepatic margin, also potentially slightly increased. Findings may still be within the realm of measurement and technical variation. Close attention on follow-up. 2. Subtle cystic changes along the cephalad margin of the spleen are difficult to see on previous imaging, perhaps new compared with prior imaging studies. 3. Pelvic implants and left lower quadrant lesion with similar size, of the area along the left iliac vasculature may be slightly larger than on the prior study. 4. Signs of extensive retroperitoneal and pelvic lymphadenectomy. 5. Hepatic steatosis. 6. 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     06/08/2019 Cancer Staging   Staging form: Ovary, AJCC 7th Edition - Clinical: Stage IIIC (rT2, N1, M0) - Signed  by Heath Lark, MD on 06/08/2019   06/10/2019 Imaging   1. 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 These findings are slightly worsened in comparison to a remote prior CT examination dated 10/04/2014.   2.  No evidence of metastatic disease in the chest.   3. Status post hysterectomy, pelvic and retroperitoneal lymph node dissection, and ventral hernia mesh repair.   4.  Hepatic steatosis.   5.  Aortic Atherosclerosis (ICD10-I70.0).   06/24/2019 -  Chemotherapy   The patient had carboplatin for chemotherapy treatment.       REVIEW OF SYSTEMS:   Constitutional: Denies fevers, chills or abnormal weight loss Eyes: Denies blurriness of vision Ears, nose, mouth, throat, and face: Denies mucositis or sore throat Respiratory: Denies cough, dyspnea or wheezes Cardiovascular: Denies palpitation, chest discomfort or lower extremity swelling Skin: Denies abnormal skin rashes Lymphatics: Denies new lymphadenopathy or easy bruising Neurological:Denies numbness, tingling or new weaknesses Behavioral/Psych: Mood is stable, no new changes  All other systems were reviewed with the patient and are negative.  I have reviewed the past medical history, past surgical history, social history and family history with the patient and they are unchanged from previous note.  ALLERGIES:  is allergic to codeine; oxycodone; sodium thiosalicylate; ciprofloxacin; codone [hydrocodone bitartrate]; daypro [  oxaprozin]; levofloxacin; stadol [butorphanol tartrate]; and sulfa antibiotics.  MEDICATIONS:  Current Outpatient Medications  Medication Sig Dispense Refill  . Biotin 10 MG CAPS Take by mouth.    . Calcium Citrate 200 MG TABS Take by mouth.    . CARBOplatin 1000 MG/100ML SOLN Inject into the vein.    Marland Kitchen escitalopram (LEXAPRO) 20 MG  tablet Take 10 mg by mouth daily.     . metFORMIN (GLUCOPHAGE) 1000 MG tablet Take 1 tablet (1,000 mg total) by mouth 2 (two) times daily with a meal. 60 tablet 0  . Multiple Vitamin (MULTIVITAMIN) capsule Take 1 capsule by mouth daily.    . Naltrexone-buPROPion HCl ER (CONTRAVE) 8-90 MG TB12 Take 2 tablets by mouth daily.    . naproxen (NAPROSYN) 500 MG tablet Take 500 mg by mouth 2 (two) times daily with a meal.    . omeprazole (PRILOSEC) 20 MG capsule Take 20 mg by mouth daily.     . ondansetron (ZOFRAN) 8 MG tablet Take 1 tablet (8 mg total) by mouth every 8 (eight) hours as needed for refractory nausea / vomiting. Start on day 3 after carboplatin chemo. 30 tablet 1  . Probiotic Product (ALIGN PO) Take 1 tablet by mouth daily.     . prochlorperazine (COMPAZINE) 10 MG tablet Take 1 tablet (10 mg total) by mouth every 6 (six) hours as needed (Nausea or vomiting). 30 tablet 1  . Vitamin D, Ergocalciferol, (DRISDOL) 1.25 MG (50000 UNIT) CAPS capsule Take 1 capsule (50,000 Units total) by mouth every 7 (seven) days. 4 capsule 0   No current facility-administered medications for this visit.    PHYSICAL EXAMINATION: ECOG PERFORMANCE STATUS: 1 - Symptomatic but completely ambulatory  Vitals:   08/12/19 0904  BP: (!) 119/58  Pulse: (!) 58  Resp: 18  Temp: 97.8 F (36.6 C)  SpO2: 99%   Filed Weights   08/12/19 0904  Weight: 195 lb 6.4 oz (88.6 kg)    GENERAL:alert, no distress and comfortable SKIN: No other skin changes at the site of biopsy EYES: normal, Conjunctiva are pink and non-injected, sclera clear OROPHARYNX:no exudate, no erythema and lips, buccal mucosa, and tongue normal  NECK: supple, thyroid normal size, non-tender, without nodularity LYMPH:  no palpable lymphadenopathy in the cervical, axillary or inguinal LUNGS: clear to auscultation and percussion with normal breathing effort HEART: regular rate & rhythm and no murmurs and no lower extremity edema ABDOMEN:abdomen  soft, non-tender and normal bowel sounds Musculoskeletal:no cyanosis of digits and no clubbing  NEURO: alert & oriented x 3 with fluent speech, no focal motor/sensory deficits  LABORATORY DATA:  I have reviewed the data as listed    Component Value Date/Time   NA 139 08/11/2019 1208   NA 141 05/31/2019 0910   NA 141 09/24/2013 1131   K 4.6 08/11/2019 1208   K 4.5 09/24/2013 1131   CL 102 08/11/2019 1208   CO2 26 08/11/2019 1208   CO2 24 09/24/2013 1131   GLUCOSE 88 08/11/2019 1208   GLUCOSE 90 09/24/2013 1131   BUN 23 08/11/2019 1208   BUN 18 05/31/2019 0910   BUN 21.2 09/24/2013 1131   CREATININE 1.08 (H) 08/11/2019 1208   CREATININE 1.18 (H) 09/02/2018 0917   CREATININE 1.1 09/24/2013 1131   CALCIUM 9.5 08/11/2019 1208   CALCIUM 9.5 09/24/2013 1131   PROT 7.4 08/11/2019 1208   PROT 6.6 05/31/2019 0910   PROT 6.8 09/24/2013 1131   ALBUMIN 3.4 (L) 08/11/2019 1208   ALBUMIN 4.5  05/31/2019 0910   ALBUMIN 3.9 09/24/2013 1131   AST 13 (L) 08/11/2019 1208   AST 23 09/24/2013 1131   ALT 13 08/11/2019 1208   ALT 27 09/24/2013 1131   ALKPHOS 85 08/11/2019 1208   ALKPHOS 81 09/24/2013 1131   BILITOT 0.3 08/11/2019 1208   BILITOT 0.3 05/31/2019 0910   BILITOT 0.53 09/24/2013 1131   GFRNONAA 55 (L) 08/11/2019 1208   GFRNONAA 50 (L) 09/02/2018 0917   GFRAA >60 08/11/2019 1208   GFRAA 58 (L) 09/02/2018 0917    No results found for: SPEP, UPEP  Lab Results  Component Value Date   WBC 5.2 08/11/2019   NEUTROABS 2.4 08/11/2019   HGB 11.9 (L) 08/11/2019   HCT 35.1 (L) 08/11/2019   MCV 93.4 08/11/2019   PLT 355 08/11/2019      Chemistry      Component Value Date/Time   NA 139 08/11/2019 1208   NA 141 05/31/2019 0910   NA 141 09/24/2013 1131   K 4.6 08/11/2019 1208   K 4.5 09/24/2013 1131   CL 102 08/11/2019 1208   CO2 26 08/11/2019 1208   CO2 24 09/24/2013 1131   BUN 23 08/11/2019 1208   BUN 18 05/31/2019 0910   BUN 21.2 09/24/2013 1131   CREATININE 1.08 (H)  08/11/2019 1208   CREATININE 1.18 (H) 09/02/2018 0917   CREATININE 1.1 09/24/2013 1131      Component Value Date/Time   CALCIUM 9.5 08/11/2019 1208   CALCIUM 9.5 09/24/2013 1131   ALKPHOS 85 08/11/2019 1208   ALKPHOS 81 09/24/2013 1131   AST 13 (L) 08/11/2019 1208   AST 23 09/24/2013 1131   ALT 13 08/11/2019 1208   ALT 27 09/24/2013 1131   BILITOT 0.3 08/11/2019 1208   BILITOT 0.3 05/31/2019 0910   BILITOT 0.53 09/24/2013 1131

## 2019-08-12 NOTE — Patient Instructions (Signed)
[facility] Discharge Instructions for Patients Receiving Chemotherapy  Today you received the following chemotherapy agents Carboplatin  To help prevent nausea and vomiting after your treatment, we encourage you to take your nausea medication as directed   If you develop nausea and vomiting that is not controlled by your nausea medication, call the clinic.   BELOW ARE SYMPTOMS THAT SHOULD BE REPORTED IMMEDIATELY:  *FEVER GREATER THAN 100.5 F  *CHILLS WITH OR WITHOUT FEVER  NAUSEA AND VOMITING THAT IS NOT CONTROLLED WITH YOUR NAUSEA MEDICATION  *UNUSUAL SHORTNESS OF BREATH  *UNUSUAL BRUISING OR BLEEDING  TENDERNESS IN MOUTH AND THROAT WITH OR WITHOUT PRESENCE OF ULCERS  *URINARY PROBLEMS  *BOWEL PROBLEMS  UNUSUAL RASH Items with * indicate a potential emergency and should be followed up as soon as possible.  Feel free to call the clinic should you have any questions or concerns. The clinic phone number is [phone].  Please show the CHEMO ALERT CARD at check-in to the Emergency Department and triage nurse.   

## 2019-08-12 NOTE — Assessment & Plan Note (Signed)
She has recurrent thrombophlebitis after each IV insertion for chemotherapy We discussed the risk and benefits of port placement in the future For now, we will prefer to hold off at least until after CT imaging is completed for assessment and evaluation of response to treatment

## 2019-08-13 ENCOUNTER — Telehealth: Payer: Self-pay | Admitting: Hematology and Oncology

## 2019-08-13 NOTE — Telephone Encounter (Signed)
Scheduled appts per 4/22 sch msg. appt dates and times were confirmed.

## 2019-08-17 DIAGNOSIS — G4733 Obstructive sleep apnea (adult) (pediatric): Secondary | ICD-10-CM | POA: Diagnosis not present

## 2019-08-18 ENCOUNTER — Encounter (INDEPENDENT_AMBULATORY_CARE_PROVIDER_SITE_OTHER): Payer: Self-pay | Admitting: Physician Assistant

## 2019-08-18 ENCOUNTER — Other Ambulatory Visit: Payer: Self-pay

## 2019-08-18 ENCOUNTER — Ambulatory Visit (INDEPENDENT_AMBULATORY_CARE_PROVIDER_SITE_OTHER): Payer: 59 | Admitting: Physician Assistant

## 2019-08-18 VITALS — BP 101/56 | HR 69 | Temp 98.0°F | Ht 66.0 in | Wt 191.0 lb

## 2019-08-18 DIAGNOSIS — E669 Obesity, unspecified: Secondary | ICD-10-CM | POA: Diagnosis not present

## 2019-08-18 DIAGNOSIS — Z9189 Other specified personal risk factors, not elsewhere classified: Secondary | ICD-10-CM | POA: Diagnosis not present

## 2019-08-18 DIAGNOSIS — Z6830 Body mass index (BMI) 30.0-30.9, adult: Secondary | ICD-10-CM

## 2019-08-18 DIAGNOSIS — R7303 Prediabetes: Secondary | ICD-10-CM | POA: Diagnosis not present

## 2019-08-18 MED ORDER — METFORMIN HCL 1000 MG PO TABS: 1000.0000 mg | ORAL_TABLET | Freq: Two times a day (BID) | ORAL | 0 refills | Status: DC

## 2019-08-18 MED FILL — METFORMIN HCL 1000 MG TABS: 1000 | 30 days supply | Qty: 60 | Fill #0

## 2019-08-18 NOTE — Progress Notes (Signed)
Chief Complaint:   OBESITY [name] is here to discuss her progress with her obesity treatment plan along with follow-up of her obesity related diagnoses. [name] is keeping a food journal and adhering to recommended goals of 1200 calories and 85 grams of protein and states she is following her eating plan approximately 70% of the time. [name] states she is exercising 0 minutes 0 times per week.  Today's visit was #: 23 Starting weight: 209 lbs Starting date: 04/29/2018 Today's weight: 191 lbs Today's date: 08/18/2019 Total lbs lost to date: 18 Total lbs lost since last in-office visit: 4  Interim History: [name] continues to get chemo every 3 weeks for her ovarian cancer. She notes some nausea intermittently and hasn't been able to eat as much meat, but is doing a great job of getting in other forms of protein.  Subjective:   Prediabetes. [name] has a diagnosis of prediabetes based on her elevated HgA1c and was informed this puts her at greater risk of developing diabetes. She continues to work on diet and exercise to decrease her risk of diabetes. She denies nausea related to metformin. No polyphagia. [name] is on metformin.  Lab Results  Component Value Date   HGBA1C 5.3 05/31/2019   Lab Results  Component Value Date   INSULIN 16.5 05/31/2019   INSULIN 17.6 11/19/2018   INSULIN 25.8 (H) 04/29/2018   At risk for diabetes mellitus. [name] is at higher than average risk for developing diabetes due to her obesity.   Assessment/Plan:   Prediabetes. [name] will continue to work on weight loss, exercise, and decreasing simple carbohydrates to help decrease the risk of diabetes. Refill was given for metFORMIN (GLUCOPHAGE) 1000 MG tablet #60 with 0 refills.  At risk for diabetes mellitus. [name] was given approximately 15 minutes of diabetes education and counseling today. We discussed intensive lifestyle modifications today with an emphasis on weight loss as well as increasing  exercise and decreasing simple carbohydrates in her diet. We also reviewed medication options with an emphasis on risk versus benefit of those discussed.   Repetitive spaced learning was employed today to elicit superior memory formation and behavioral change.  Class 1 obesity with serious comorbidity and body mass index (BMI) of 30.0 to 30.9 in adult, unspecified obesity type. Refill was given on Contrave #120 with 0 refills,  [name] is currently in the action stage of change. As such, her goal is to continue with weight loss efforts. She has agreed to keeping a food journal and adhering to recommended goals of 1200 calories and 85 grams of protein.   Exercise goals: For substantial health benefits, adults should do at least 150 minutes (2 hours and 30 minutes) a week of moderate-intensity, or 75 minutes (1 hour and 15 minutes) a week of vigorous-intensity aerobic physical activity, or an equivalent combination of moderate- and vigorous-intensity aerobic activity. Aerobic activity should be performed in episodes of at least 10 minutes, and preferably, it should be spread throughout the week.  Behavioral modification strategies: increasing lean protein intake and no skipping meals.  [name] has agreed to follow-up with our clinic in 3 weeks. She was informed of the importance of frequent follow-up visits to maximize her success with intensive lifestyle modifications for her multiple health conditions.   Objective:   Blood pressure (!) 101/56, pulse 69, temperature 98 F (36.7 C), temperature source Oral, height 5\' 6"  (1.676 m), weight 191 lb (86.6 kg), SpO2 99 %. Body mass index is 30.83 kg/m.  General: Cooperative, alert, well developed, in no acute distress. HEENT: Conjunctivae and lids unremarkable. Cardiovascular: Regular rhythm.  Lungs: Normal work of breathing. Neurologic: No focal deficits.   Lab Results  Component Value Date   CREATININE 1.08 (H) 08/11/2019   BUN 23 08/11/2019    NA 139 08/11/2019   K 4.6 08/11/2019   CL 102 08/11/2019   CO2 26 08/11/2019   Lab Results  Component Value Date   ALT 13 08/11/2019   AST 13 (L) 08/11/2019   ALKPHOS 85 08/11/2019   BILITOT 0.3 08/11/2019   Lab Results  Component Value Date   HGBA1C 5.3 05/31/2019   HGBA1C 5.2 11/19/2018   HGBA1C 5.5 04/29/2018   Lab Results  Component Value Date   INSULIN 16.5 05/31/2019   INSULIN 17.6 11/19/2018   INSULIN 25.8 (H) 04/29/2018   Lab Results  Component Value Date   TSH 0.564 04/29/2018   Lab Results  Component Value Date   CHOL 230 (H) 05/31/2019   HDL 50 05/31/2019   LDLCALC 142 (H) 05/31/2019   TRIG 211 (H) 05/31/2019   Lab Results  Component Value Date   WBC 5.2 08/11/2019   HGB 11.9 (L) 08/11/2019   HCT 35.1 (L) 08/11/2019   MCV 93.4 08/11/2019   PLT 355 08/11/2019   No results found for: IRON, TIBC, FERRITIN  Attestation Statements:   Reviewed by clinician on day of visit: allergies, medications, problem list, medical history, surgical history, family history, social history, and previous encounter notes.  IMichaelene Song, am acting as transcriptionist for Abby Potash, PA-C   I have reviewed the above documentation for accuracy and completeness, and I agree with the above. Abby Potash, PA-C

## 2019-08-24 ENCOUNTER — Encounter (INDEPENDENT_AMBULATORY_CARE_PROVIDER_SITE_OTHER): Payer: Self-pay | Admitting: Physician Assistant

## 2019-08-25 ENCOUNTER — Encounter (INDEPENDENT_AMBULATORY_CARE_PROVIDER_SITE_OTHER): Payer: Self-pay | Admitting: Physician Assistant

## 2019-08-25 ENCOUNTER — Other Ambulatory Visit: Payer: 59

## 2019-08-25 ENCOUNTER — Other Ambulatory Visit (INDEPENDENT_AMBULATORY_CARE_PROVIDER_SITE_OTHER): Payer: Self-pay | Admitting: Physician Assistant

## 2019-08-25 DIAGNOSIS — E559 Vitamin D deficiency, unspecified: Secondary | ICD-10-CM

## 2019-08-25 DIAGNOSIS — Z6841 Body Mass Index (BMI) 40.0 and over, adult: Secondary | ICD-10-CM

## 2019-08-25 MED ORDER — CONTRAVE 8-90 MG PO TB12: 2.0000 | ORAL_TABLET | Freq: Every day | ORAL | 0 refills | Status: DC

## 2019-08-25 NOTE — Telephone Encounter (Signed)
Please advise 

## 2019-08-25 NOTE — Telephone Encounter (Signed)
Ok to refill contrave. Thanks

## 2019-08-25 NOTE — Telephone Encounter (Signed)
Please review--- next appt 09/09/19

## 2019-08-25 NOTE — Telephone Encounter (Signed)
Sent!

## 2019-08-26 ENCOUNTER — Ambulatory Visit: Payer: 59

## 2019-08-26 ENCOUNTER — Ambulatory Visit: Payer: 59 | Admitting: Hematology and Oncology

## 2019-08-27 NOTE — Progress Notes (Signed)
Pharmacist Chemotherapy Monitoring - Follow Up Assessment    I verify that I have reviewed each item in the below checklist:  . Regimen for the patient is scheduled for the appropriate day and plan matches scheduled date. Marland Kitchen Appropriate non-routine labs are ordered dependent on drug ordered. . If applicable, additional medications reviewed and ordered per protocol based on lifetime cumulative doses and/or treatment regimen.   Plan for follow-up and/or issues identified: No . I-vent associated with next due treatment: No . MD and/or nursing notified: No  Xiana Carns D 08/27/2019 12:14 PM

## 2019-09-01 ENCOUNTER — Inpatient Hospital Stay: Payer: 59 | Attending: Hematology

## 2019-09-01 ENCOUNTER — Other Ambulatory Visit: Payer: Self-pay

## 2019-09-01 DIAGNOSIS — D630 Anemia in neoplastic disease: Secondary | ICD-10-CM | POA: Insufficient documentation

## 2019-09-01 DIAGNOSIS — Z5111 Encounter for antineoplastic chemotherapy: Secondary | ICD-10-CM | POA: Diagnosis not present

## 2019-09-01 DIAGNOSIS — N183 Chronic kidney disease, stage 3 unspecified: Secondary | ICD-10-CM | POA: Insufficient documentation

## 2019-09-01 DIAGNOSIS — Z7189 Other specified counseling: Secondary | ICD-10-CM

## 2019-09-01 DIAGNOSIS — C569 Malignant neoplasm of unspecified ovary: Secondary | ICD-10-CM | POA: Insufficient documentation

## 2019-09-01 LAB — CBC WITH DIFFERENTIAL (CANCER CENTER ONLY)
Abs Immature Granulocytes: 0.01 10*3/uL (ref 0.00–0.07)
Basophils Absolute: 0 10*3/uL (ref 0.0–0.1)
Basophils Relative: 1 %
Eosinophils Absolute: 0.1 10*3/uL (ref 0.0–0.5)
Eosinophils Relative: 1 %
HCT: 35.4 % — ABNORMAL LOW (ref 36.0–46.0)
Hemoglobin: 11.9 g/dL — ABNORMAL LOW (ref 12.0–15.0)
Immature Granulocytes: 0 %
Lymphocytes Relative: 45 %
Lymphs Abs: 2 10*3/uL (ref 0.7–4.0)
MCH: 32.2 pg (ref 26.0–34.0)
MCHC: 33.6 g/dL (ref 30.0–36.0)
MCV: 95.9 fL (ref 80.0–100.0)
Monocytes Absolute: 0.6 10*3/uL (ref 0.1–1.0)
Monocytes Relative: 13 %
Neutro Abs: 1.7 10*3/uL (ref 1.7–7.7)
Neutrophils Relative %: 40 %
Platelet Count: 153 10*3/uL (ref 150–400)
RBC: 3.69 MIL/uL — ABNORMAL LOW (ref 3.87–5.11)
RDW: 16.4 % — ABNORMAL HIGH (ref 11.5–15.5)
WBC Count: 4.3 10*3/uL (ref 4.0–10.5)
nRBC: 0 % (ref 0.0–0.2)

## 2019-09-01 LAB — COMPREHENSIVE METABOLIC PANEL
ALT: 20 U/L (ref 0–44)
AST: 19 U/L (ref 15–41)
Albumin: 3.8 g/dL (ref 3.5–5.0)
Alkaline Phosphatase: 74 U/L (ref 38–126)
Anion gap: 11 (ref 5–15)
BUN: 21 mg/dL (ref 8–23)
CO2: 28 mmol/L (ref 22–32)
Calcium: 9.2 mg/dL (ref 8.9–10.3)
Chloride: 102 mmol/L (ref 98–111)
Creatinine, Ser: 1.05 mg/dL — ABNORMAL HIGH (ref 0.44–1.00)
GFR calc Af Amer: >60 mL/min (ref >60)
GFR calc non Af Amer: 57 mL/min — ABNORMAL LOW (ref >60)
Glucose, Bld: 83 mg/dL (ref 70–99)
Potassium: 4.1 mmol/L (ref 3.5–5.1)
Sodium: 141 mmol/L (ref 135–145)
Total Bilirubin: 0.3 mg/dL (ref 0.3–1.2)
Total Protein: 6.9 g/dL (ref 6.5–8.1)

## 2019-09-02 ENCOUNTER — Inpatient Hospital Stay: Payer: 59

## 2019-09-02 ENCOUNTER — Other Ambulatory Visit: Payer: Self-pay

## 2019-09-02 ENCOUNTER — Inpatient Hospital Stay (HOSPITAL_BASED_OUTPATIENT_CLINIC_OR_DEPARTMENT_OTHER): Payer: 59 | Admitting: Hematology and Oncology

## 2019-09-02 ENCOUNTER — Encounter: Payer: Self-pay | Admitting: Hematology and Oncology

## 2019-09-02 VITALS — BP 125/66 | HR 54 | Temp 97.8°F | Resp 17 | Ht 66.0 in | Wt 198.2 lb

## 2019-09-02 DIAGNOSIS — C569 Malignant neoplasm of unspecified ovary: Secondary | ICD-10-CM | POA: Diagnosis not present

## 2019-09-02 DIAGNOSIS — N183 Chronic kidney disease, stage 3 unspecified: Secondary | ICD-10-CM

## 2019-09-02 DIAGNOSIS — D630 Anemia in neoplastic disease: Secondary | ICD-10-CM

## 2019-09-02 DIAGNOSIS — Z5111 Encounter for antineoplastic chemotherapy: Secondary | ICD-10-CM | POA: Diagnosis not present

## 2019-09-02 DIAGNOSIS — Z7189 Other specified counseling: Secondary | ICD-10-CM

## 2019-09-02 LAB — INHIBIN B: Inhibin B: 94 pg/mL — ABNORMAL HIGH (ref 0.0–16.9)

## 2019-09-02 MED ORDER — SODIUM CHLORIDE 0.9 % IV SOLN
150.0000 mg | Freq: Once | INTRAVENOUS | Status: AC
  Administered 2019-09-02: 150 mg via INTRAVENOUS
  Filled 2019-09-02: qty 150

## 2019-09-02 MED ORDER — PALONOSETRON HCL INJECTION 0.25 MG/5ML
0.2500 mg | Freq: Once | INTRAVENOUS | Status: AC
  Administered 2019-09-02: 0.25 mg via INTRAVENOUS

## 2019-09-02 MED ORDER — SODIUM CHLORIDE 0.9 % IV SOLN
10.0000 mg | Freq: Once | INTRAVENOUS | Status: AC
  Administered 2019-09-02: 10 mg via INTRAVENOUS
  Filled 2019-09-02: qty 10

## 2019-09-02 MED ORDER — SODIUM CHLORIDE 0.9 % IV SOLN
Freq: Once | INTRAVENOUS | Status: AC
  Filled 2019-09-02: qty 250

## 2019-09-02 MED ORDER — SODIUM CHLORIDE 0.9 % IV SOLN
510.0000 mg | Freq: Once | INTRAVENOUS | Status: AC
  Administered 2019-09-02: 510 mg via INTRAVENOUS
  Filled 2019-09-02: qty 51

## 2019-09-02 MED ORDER — PALONOSETRON HCL INJECTION 0.25 MG/5ML
INTRAVENOUS | Status: AC
  Filled 2019-09-02: qty 5

## 2019-09-02 NOTE — Patient Instructions (Signed)
[facility] Discharge Instructions for Patients Receiving Chemotherapy  Today you received the following chemotherapy agents: carboplatin.  To help prevent nausea and vomiting after your treatment, we encourage you to take your nausea medication as directed.   If you develop nausea and vomiting that is not controlled by your nausea medication, call the clinic.   BELOW ARE SYMPTOMS THAT SHOULD BE REPORTED IMMEDIATELY:  *FEVER GREATER THAN 100.5 F  *CHILLS WITH OR WITHOUT FEVER  NAUSEA AND VOMITING THAT IS NOT CONTROLLED WITH YOUR NAUSEA MEDICATION  *UNUSUAL SHORTNESS OF BREATH  *UNUSUAL BRUISING OR BLEEDING  TENDERNESS IN MOUTH AND THROAT WITH OR WITHOUT PRESENCE OF ULCERS  *URINARY PROBLEMS  *BOWEL PROBLEMS  UNUSUAL RASH Items with * indicate a potential emergency and should be followed up as soon as possible.  Feel free to call the clinic should you have any questions or concerns. The clinic phone number is [phone].  Please show the CHEMO ALERT CARD at check-in to the Emergency Department and triage nurse.   

## 2019-09-02 NOTE — Progress Notes (Signed)
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[place]) She tolerated last cycle of treatment fairly well without any venous access issue or phlebitis We will proceed with cycle 4 of treatment today Before cycle 5, I plan to order CT imaging to assess response to therapy She is in agreement  CKD (chronic kidney disease), stage III She has intermittent fluctuation of creatinine function We will adjust her carboplatin dose accordingly  Anemia in neoplastic disease She is mildly anemic but not symptomatic This is multifactorial, likely due to side effects of treatment and chronic kidney disease Observe only for now   Orders Placed This Encounter  Procedures  . CT ABDOMEN PELVIS W CONTRAST    Standing Status:   Future    Standing Expiration Date:   09/01/2020    Order Specific Question:   If indicated for the ordered procedure, I authorize the administration of contrast media per Radiology protocol    Answer:   Yes    Order Specific Question:   Preferred imaging location?    Answer:   [facility]    Order Specific Question:   Radiology Contrast Protocol - do NOT remove file path    Answer:   [email or web address]    All questions were answered. The patient knows to call the clinic with any problems, questions or concerns. The total time spent in the appointment was 20 minutes encounter with patients including review of chart and various tests results, discussions about plan of care and coordination of care plan   Heath Lark, MD 09/02/2019 9:13 AM  INTERVAL HISTORY: Please see below for problem oriented charting. She returns for chemotherapy and follow-up She tolerated last cycle of therapy well Denies significant nausea or side effects Mild constipation is stable She denies significant problems with her venous  access issue of phlebitis with this last cycle  SUMMARY OF ONCOLOGIC HISTORY: Oncology History Overview Note  2018 - Core biopsy for ER/PR and Foundation One testing performed. Unfortunately, no sufficient tissue for Foundation One testing. ER was 50% and PR 90%.  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     - 09/2000 Chemotherapy   6 cycles of IP cisplatin and etoposide    02/2009 Relapse/Recurrence   CT showed increased size of nodules in pelvis   2010 Surgery   Exlap with section of tumor nodules near cecum and left pelvic sidewall. Tumor: ER negative, PR positive    Treatment Plan Change   Alternated 2 week courses of Megace and Tamoxifen - ended 03/2012   03/2012 PET scan   CT - progressive disease   2013 Treatment Plan Change   Letrozole   01/2016 Imaging   MRI showed progressive disease   2017 Treatment Plan Change   Two weeks of alternating Tamoxifen 20mg  daily and then Megace 40mg  TID   08/2016 Imaging   MRI showed progressive disease with peritoneal implants near liver, in pelvis  01/2017 Treatment Plan Change   Lupron 11.25 q 3 months   11/2017 Imaging   Overall mixed response.   Mixed cystic/solid lesions in the left pelvis are mildly improved.   Cystic peritoneal disease, including the dominant lesion along the posterior right hepatic lobe, is mildly progressed.   Subcapsular lesion along the posterior right hepatic lobe is unchanged.   03/2018  Imaging   MRI A/P: Mixed response of individual peritoneal metastases in the pelvis, as described above. Overall, there has been no significant change in bulk of disease.   Stable cystic peritoneal metastatic disease along the capsular surfaces of the liver and spleen.   No new sites of metastatic disease identified within the abdomen or pelvis.   08/2018 Imaging   MRI A/P: Status post hysterectomy and bilateral salpingo-oophorectomy.   Mixed cystic/solid peritoneal implants in the abdomen/pelvis, as above. Dominant cystic implant along the posterior liver surface is mildly increased. Remaining lesions are overall grossly unchanged.   No new lesions are identified.   01/28/2019 Imaging   Mri A/P: 1. Relatively similar appearance of peritoneal metastasis. A posterior right hepatic capsular based lesion is similar to minimally decreased in size. Left pelvic implants are primarily similar with possible enlargement of an anterior high left pelvic cystic implant. No new disease identified. 2.  Aortic Atherosclerosis (ICD10-I70.0). 3. Hepatic steatosis. 4. Left adrenal adenoma.   06/02/2019 Imaging   MRI 1. Potential slight enlargement of dominant cystic area and solid component, associated with rind like signal variation on T2 along the inferior right hepatic margin, also potentially slightly increased. Findings may still be within the realm of measurement and technical variation. Close attention on follow-up. 2. Subtle cystic changes along the cephalad margin of the spleen are difficult to see on previous imaging, perhaps new compared with prior imaging studies. 3. Pelvic implants and left lower quadrant lesion with similar size, of the area along the left iliac vasculature may be slightly larger than on the prior study. 4. Signs of extensive retroperitoneal and pelvic lymphadenectomy. 5. Hepatic steatosis. 6. Left adrenal adenoma along with stable appearance of Bosniak 2 lesion in the left  kidney.     06/08/2019 Cancer Staging   Staging form: [place], AJCC 7th Edition - Clinical: Stage IIIC (rT2, N1, M0) - Signed by Heath Lark, MD on 06/08/2019   06/10/2019 Imaging   1. 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 These findings are slightly worsened in comparison to a remote prior CT examination dated 10/04/2014.   2.  No evidence of metastatic disease in the chest.   3. Status post hysterectomy, pelvic and retroperitoneal lymph node dissection, and ventral hernia mesh repair.   4.  Hepatic steatosis.   5.  Aortic Atherosclerosis (ICD10-I70.0).   06/24/2019 -  Chemotherapy   The patient had carboplatin for chemotherapy treatment.       REVIEW OF SYSTEMS:   Constitutional: Denies fevers, chills or abnormal weight loss Eyes: Denies blurriness of vision Ears, nose, mouth, throat, and face: Denies mucositis or sore throat Respiratory: Denies cough, dyspnea or wheezes Cardiovascular: Denies palpitation, chest discomfort or lower extremity swelling Gastrointestinal:  Denies nausea, heartburn or change in bowel habits Skin: Denies abnormal skin rashes Lymphatics: Denies new lymphadenopathy or easy bruising Neurological:Denies numbness, tingling or new weaknesses Behavioral/Psych: Mood is stable, no new changes  All other systems  were reviewed with the patient and are negative.  I have reviewed the past medical history, past surgical history, social history and family history with the patient and they are unchanged from previous note.  ALLERGIES:  is allergic to codeine; oxycodone; sodium thiosalicylate; ciprofloxacin; codone [hydrocodone bitartrate]; daypro [oxaprozin]; levofloxacin; stadol [butorphanol tartrate]; and sulfa antibiotics.  MEDICATIONS:  Current Outpatient Medications  Medication  Sig Dispense Refill  . Biotin 10 MG CAPS Take by mouth.    . Calcium Citrate 200 MG TABS Take by mouth.    . CARBOplatin 1000 MG/100ML SOLN Inject into the vein.    Marland Kitchen escitalopram (LEXAPRO) 20 MG tablet Take 10 mg by mouth daily.     . metFORMIN (GLUCOPHAGE) 1000 MG tablet Take 1 tablet (1,000 mg total) by mouth 2 (two) times daily with a meal. 60 tablet 0  . Multiple Vitamin (MULTIVITAMIN) capsule Take 1 capsule by mouth daily.    . Naltrexone-buPROPion HCl ER (CONTRAVE) 8-90 MG TB12 Take 2 tablets by mouth daily. 120 tablet 0  . naproxen (NAPROSYN) 500 MG tablet Take 500 mg by mouth 2 (two) times daily with a meal.    . omeprazole (PRILOSEC) 20 MG capsule Take 20 mg by mouth daily.     . ondansetron (ZOFRAN) 8 MG tablet Take 1 tablet (8 mg total) by mouth every 8 (eight) hours as needed for refractory nausea / vomiting. Start on day 3 after carboplatin chemo. 30 tablet 1  . Probiotic Product (ALIGN PO) Take 1 tablet by mouth daily.     . prochlorperazine (COMPAZINE) 10 MG tablet Take 1 tablet (10 mg total) by mouth every 6 (six) hours as needed (Nausea or vomiting). 30 tablet 1  . Vitamin D, Ergocalciferol, (DRISDOL) 1.25 MG (50000 UNIT) CAPS capsule Take 1 capsule (50,000 Units total) by mouth every 7 (seven) days. 4 capsule 0   No current facility-administered medications for this visit.    PHYSICAL EXAMINATION: ECOG PERFORMANCE STATUS: 1 - Symptomatic but completely ambulatory  Vitals:   09/02/19 0831  BP: 125/66  Pulse: (!) 54  Resp: 17  Temp: 97.8 F (36.6 C)  SpO2: 100%   Filed Weights   09/02/19 0831  Weight: 198 lb 3.2 oz (89.9 kg)    GENERAL:alert, no distress and comfortable SKIN: skin color, texture, turgor are normal, no rashes or significant lesions EYES: normal, Conjunctiva are pink and non-injected, sclera clear OROPHARYNX:no exudate, no erythema and lips, buccal mucosa, and tongue normal  NECK: supple, thyroid normal size, non-tender, without  nodularity LYMPH:  no palpable lymphadenopathy in the cervical, axillary or inguinal LUNGS: clear to auscultation and percussion with normal breathing effort HEART: regular rate & rhythm and no murmurs and no lower extremity edema ABDOMEN:abdomen soft, non-tender and normal bowel sounds Musculoskeletal:no cyanosis of digits and no clubbing  NEURO: alert & oriented x 3 with fluent speech, no focal motor/sensory deficits  LABORATORY DATA:  I have reviewed the data as listed    Component Value Date/Time   NA 141 09/01/2019 1452   NA 141 05/31/2019 0910   NA 141 09/24/2013 1131   K 4.1 09/01/2019 1452   K 4.5 09/24/2013 1131   CL 102 09/01/2019 1452   CO2 28 09/01/2019 1452   CO2 24 09/24/2013 1131   GLUCOSE 83 09/01/2019 1452   GLUCOSE 90 09/24/2013 1131   BUN 21 09/01/2019 1452   BUN 18 05/31/2019 0910   BUN 21.2 09/24/2013 1131   CREATININE 1.05 (H) 09/01/2019 1452   CREATININE  1.18 (H) 09/02/2018 0917   CREATININE 1.1 09/24/2013 1131   CALCIUM 9.2 09/01/2019 1452   CALCIUM 9.5 09/24/2013 1131   PROT 6.9 09/01/2019 1452   PROT 6.6 05/31/2019 0910   PROT 6.8 09/24/2013 1131   ALBUMIN 3.8 09/01/2019 1452   ALBUMIN 4.5 05/31/2019 0910   ALBUMIN 3.9 09/24/2013 1131   AST 19 09/01/2019 1452   AST 23 09/24/2013 1131   ALT 20 09/01/2019 1452   ALT 27 09/24/2013 1131   ALKPHOS 74 09/01/2019 1452   ALKPHOS 81 09/24/2013 1131   BILITOT 0.3 09/01/2019 1452   BILITOT 0.3 05/31/2019 0910   BILITOT 0.53 09/24/2013 1131   GFRNONAA 57 (L) 09/01/2019 1452   GFRNONAA 50 (L) 09/02/2018 0917   GFRAA >60 09/01/2019 1452   GFRAA 58 (L) 09/02/2018 0917    No results found for: SPEP, UPEP  Lab Results  Component Value Date   WBC 4.3 09/01/2019   NEUTROABS 1.7 09/01/2019   HGB 11.9 (L) 09/01/2019   HCT 35.4 (L) 09/01/2019   MCV 95.9 09/01/2019   PLT 153 09/01/2019      Chemistry      Component Value Date/Time   NA 141 09/01/2019 1452   NA 141 05/31/2019 0910   NA 141  09/24/2013 1131   K 4.1 09/01/2019 1452   K 4.5 09/24/2013 1131   CL 102 09/01/2019 1452   CO2 28 09/01/2019 1452   CO2 24 09/24/2013 1131   BUN 21 09/01/2019 1452   BUN 18 05/31/2019 0910   BUN 21.2 09/24/2013 1131   CREATININE 1.05 (H) 09/01/2019 1452   CREATININE 1.18 (H) 09/02/2018 0917   CREATININE 1.1 09/24/2013 1131      Component Value Date/Time   CALCIUM 9.2 09/01/2019 1452   CALCIUM 9.5 09/24/2013 1131   ALKPHOS 74 09/01/2019 1452   ALKPHOS 81 09/24/2013 1131   AST 19 09/01/2019 1452   AST 23 09/24/2013 1131   ALT 20 09/01/2019 1452   ALT 27 09/24/2013 1131   BILITOT 0.3 09/01/2019 1452   BILITOT 0.3 05/31/2019 0910   BILITOT 0.53 09/24/2013 1131

## 2019-09-02 NOTE — Assessment & Plan Note (Signed)
She is mildly anemic but not symptomatic This is multifactorial, likely due to side effects of treatment and chronic kidney disease Observe only for now

## 2019-09-02 NOTE — Assessment & Plan Note (Addendum)
She has intermittent fluctuation of creatinine function We will adjust her carboplatin dose accordingly

## 2019-09-02 NOTE — Assessment & Plan Note (Signed)
She tolerated last cycle of treatment fairly well without any venous access issue or phlebitis We will proceed with cycle 4 of treatment today Before cycle 5, I plan to order CT imaging to assess response to therapy She is in agreement

## 2019-09-08 ENCOUNTER — Ambulatory Visit (INDEPENDENT_AMBULATORY_CARE_PROVIDER_SITE_OTHER): Payer: 59 | Admitting: Physician Assistant

## 2019-09-08 ENCOUNTER — Encounter (INDEPENDENT_AMBULATORY_CARE_PROVIDER_SITE_OTHER): Payer: Self-pay | Admitting: Physician Assistant

## 2019-09-08 ENCOUNTER — Other Ambulatory Visit: Payer: Self-pay

## 2019-09-08 VITALS — BP 97/59 | HR 67 | Temp 98.0°F | Ht 66.0 in | Wt 193.0 lb

## 2019-09-08 DIAGNOSIS — E559 Vitamin D deficiency, unspecified: Secondary | ICD-10-CM

## 2019-09-08 DIAGNOSIS — E7849 Other hyperlipidemia: Secondary | ICD-10-CM | POA: Diagnosis not present

## 2019-09-08 DIAGNOSIS — Z9189 Other specified personal risk factors, not elsewhere classified: Secondary | ICD-10-CM | POA: Diagnosis not present

## 2019-09-08 DIAGNOSIS — E669 Obesity, unspecified: Secondary | ICD-10-CM

## 2019-09-08 DIAGNOSIS — Z6831 Body mass index (BMI) 31.0-31.9, adult: Secondary | ICD-10-CM | POA: Diagnosis not present

## 2019-09-08 MED ORDER — VITAMIN D (ERGOCALCIFEROL) 1.25 MG (50000 UNIT) PO CAPS: 50000.0000 [IU] | ORAL_CAPSULE | ORAL | 0 refills | Status: DC

## 2019-09-08 MED FILL — VIT D2 1.25 MG (50,000 UNIT: 1.25 MG | 28 days supply | Qty: 4 | Fill #0

## 2019-09-09 ENCOUNTER — Ambulatory Visit (INDEPENDENT_AMBULATORY_CARE_PROVIDER_SITE_OTHER): Payer: 59 | Admitting: Physician Assistant

## 2019-09-09 NOTE — Progress Notes (Signed)
Chief Complaint:   OBESITY [name] is here to discuss her progress with her obesity treatment plan along with follow-up of her obesity related diagnoses. [name] is on keeping a food journal and adhering to recommended goals of 1200 calories and 85 grams of protein daily and states she is following her eating plan approximately 50% of the time. [name] states she is doing 0 minutes 0 times per week.  Today's visit was #: 24 Starting weight: 209 lbs Starting date: 04/29/2018 Today's weight: 193 lbs Today's date: 09/08/2019 Total lbs lost to date: 16 Total lbs lost since last in-office visit: 0  Interim History: [place] had her last chemo treatment last week. She continues with some nausea and is unable to get in all of her protein. She is eating a lot of carbohydrates, and she is not journaling.  Subjective:   1. Vitamin D deficiency [name] denies nausea, vomiting, or muscle weakness on Vit D.  2. Other hyperlipidemia [name] is not currently on any medications, and she denies chest pain.  3. At risk for osteoporosis [name] is at higher risk of osteopenia and osteoporosis due to Vitamin D deficiency.   Assessment/Plan:   1. Vitamin D deficiency Low Vitamin D level contributes to fatigue and are associated with obesity, breast, and colon cancer. We will refill prescription Vitamin D for 1 month. [name] will follow-up for routine testing of Vitamin D, at least 2-3 times per year to avoid over-replacement.  - Vitamin D, Ergocalciferol, (DRISDOL) 1.25 MG (50000 UNIT) CAPS capsule; Take 1 capsule (50,000 Units total) by mouth every 7 (seven) days.  Dispense: 4 capsule; Refill: 0  2. Other hyperlipidemia Cardiovascular risk and specific lipid/LDL goals reviewed. We discussed several lifestyle modifications today and [name] will continue to work on diet, exercise and weight loss efforts. Orders and follow up as documented in patient record.   Counseling Intensive lifestyle modifications are the  first line treatment for this issue. . Dietary changes: Increase soluble fiber. Decrease simple carbohydrates. . Exercise changes: Moderate to vigorous-intensity aerobic activity 150 minutes per week if tolerated. . Lipid-lowering medications: see documented in medical record.  3. At risk for osteoporosis [name] was given approximately 15 minutes of osteoporosis prevention counseling today. [name] is at risk for osteopenia and osteoporosis due to her Vitamin D deficiency. She was encouraged to take her Vitamin D and follow her higher calcium diet and increase strengthening exercise to help strengthen her bones and decrease her risk of osteopenia and osteoporosis.  Repetitive spaced learning was employed today to elicit superior memory formation and behavioral change.  4. Class 1 obesity with serious comorbidity and body mass index (BMI) of 31.0 to 31.9 in adult, unspecified obesity type [name] is currently in the action stage of change. As such, her goal is to continue with weight loss efforts. She has agreed to keeping a food journal and adhering to recommended goals of 1200 calories and 85 grams of protein daily.   We will recheck labs at her next visit.  Exercise goals: No exercise has been prescribed at this time.  Behavioral modification strategies: meal planning and cooking strategies and keeping healthy foods in the home.  [name] has agreed to follow-up with our clinic in 3 weeks. She was informed of the importance of frequent follow-up visits to maximize her success with intensive lifestyle modifications for her multiple health conditions.   Objective:   Blood pressure (!) 97/59, pulse 67, temperature 98 F (36.7 C), temperature source Oral, height 5'  6" (1.676 m), weight 193 lb (87.5 kg), SpO2 99 %. Body mass index is 31.15 kg/m.  General: Cooperative, alert, well developed, in no acute distress. HEENT: Conjunctivae and lids unremarkable. Cardiovascular: Regular rhythm.  Lungs:  Normal work of breathing. Neurologic: No focal deficits.   Lab Results  Component Value Date   CREATININE 1.05 (H) 09/01/2019   BUN 21 09/01/2019   NA 141 09/01/2019   K 4.1 09/01/2019   CL 102 09/01/2019   CO2 28 09/01/2019   Lab Results  Component Value Date   ALT 20 09/01/2019   AST 19 09/01/2019   ALKPHOS 74 09/01/2019   BILITOT 0.3 09/01/2019   Lab Results  Component Value Date   HGBA1C 5.3 05/31/2019   HGBA1C 5.2 11/19/2018   HGBA1C 5.5 04/29/2018   Lab Results  Component Value Date   INSULIN 16.5 05/31/2019   INSULIN 17.6 11/19/2018   INSULIN 25.8 (H) 04/29/2018   Lab Results  Component Value Date   TSH 0.564 04/29/2018   Lab Results  Component Value Date   CHOL 230 (H) 05/31/2019   HDL 50 05/31/2019   LDLCALC 142 (H) 05/31/2019   TRIG 211 (H) 05/31/2019   Lab Results  Component Value Date   WBC 4.3 09/01/2019   HGB 11.9 (L) 09/01/2019   HCT 35.4 (L) 09/01/2019   MCV 95.9 09/01/2019   PLT 153 09/01/2019   No results found for: IRON, TIBC, FERRITIN  Attestation Statements:   Reviewed by clinician on day of visit: allergies, medications, problem list, medical history, surgical history, family history, social history, and previous encounter notes.   Wilhemena Durie, am acting as transcriptionist for [employer], PA-C.  I have reviewed the above documentation for accuracy and completeness, and I agree with the above. Abby Potash, PA-C

## 2019-09-13 ENCOUNTER — Encounter (INDEPENDENT_AMBULATORY_CARE_PROVIDER_SITE_OTHER): Payer: Self-pay

## 2019-09-13 MED FILL — CONTRAVE ER 8-90 MG TABLET: 8-90 | 60 days supply | Qty: 120 | Fill #0

## 2019-09-14 DIAGNOSIS — Z20828 Contact with and (suspected) exposure to other viral communicable diseases: Secondary | ICD-10-CM | POA: Diagnosis not present

## 2019-09-21 DIAGNOSIS — R32 Unspecified urinary incontinence: Secondary | ICD-10-CM | POA: Diagnosis not present

## 2019-09-22 ENCOUNTER — Inpatient Hospital Stay: Payer: 59 | Attending: Hematology

## 2019-09-22 ENCOUNTER — Other Ambulatory Visit: Payer: Self-pay

## 2019-09-22 ENCOUNTER — Encounter (HOSPITAL_COMMUNITY): Payer: Self-pay

## 2019-09-22 ENCOUNTER — Ambulatory Visit (HOSPITAL_COMMUNITY)
Admission: RE | Admit: 2019-09-22 | Discharge: 2019-09-22 | Disposition: A | Discharge status: Home / Self Care | Payer: 59 | Source: Ambulatory Visit | Attending: Hematology and Oncology | Admitting: Hematology and Oncology

## 2019-09-22 DIAGNOSIS — C569 Malignant neoplasm of unspecified ovary: Secondary | ICD-10-CM | POA: Insufficient documentation

## 2019-09-22 DIAGNOSIS — D61818 Other pancytopenia: Secondary | ICD-10-CM | POA: Insufficient documentation

## 2019-09-22 DIAGNOSIS — D3502 Benign neoplasm of left adrenal gland: Secondary | ICD-10-CM | POA: Diagnosis not present

## 2019-09-22 DIAGNOSIS — Z7189 Other specified counseling: Secondary | ICD-10-CM

## 2019-09-22 LAB — CBC WITH DIFFERENTIAL (CANCER CENTER ONLY)
Abs Immature Granulocytes: 0 10*3/uL (ref 0.00–0.07)
Basophils Absolute: 0 10*3/uL (ref 0.0–0.1)
Basophils Relative: 0 %
Eosinophils Absolute: 0 10*3/uL (ref 0.0–0.5)
Eosinophils Relative: 1 %
HCT: 29.3 % — ABNORMAL LOW (ref 36.0–46.0)
Hemoglobin: 10.1 g/dL — ABNORMAL LOW (ref 12.0–15.0)
Immature Granulocytes: 0 %
Lymphocytes Relative: 44 %
Lymphs Abs: 1.1 10*3/uL (ref 0.7–4.0)
MCH: 34.6 pg — ABNORMAL HIGH (ref 26.0–34.0)
MCHC: 34.5 g/dL (ref 30.0–36.0)
MCV: 100.3 fL — ABNORMAL HIGH (ref 80.0–100.0)
Monocytes Absolute: 0.3 10*3/uL (ref 0.1–1.0)
Monocytes Relative: 13 %
Neutro Abs: 1.1 10*3/uL — ABNORMAL LOW (ref 1.7–7.7)
Neutrophils Relative %: 42 %
Platelet Count: 86 10*3/uL — ABNORMAL LOW (ref 150–400)
RBC: 2.92 MIL/uL — ABNORMAL LOW (ref 3.87–5.11)
RDW: 17.5 % — ABNORMAL HIGH (ref 11.5–15.5)
WBC Count: 2.6 10*3/uL — ABNORMAL LOW (ref 4.0–10.5)
nRBC: 0 % (ref 0.0–0.2)

## 2019-09-22 LAB — COMPREHENSIVE METABOLIC PANEL
ALT: 19 U/L (ref 0–44)
AST: 16 U/L (ref 15–41)
Albumin: 3.7 g/dL (ref 3.5–5.0)
Alkaline Phosphatase: 60 U/L (ref 38–126)
Anion gap: 12 (ref 5–15)
BUN: 27 mg/dL — ABNORMAL HIGH (ref 8–23)
CO2: 23 mmol/L (ref 22–32)
Calcium: 9.1 mg/dL (ref 8.9–10.3)
Chloride: 106 mmol/L (ref 98–111)
Creatinine, Ser: 1.02 mg/dL — ABNORMAL HIGH (ref 0.44–1.00)
GFR calc Af Amer: >60 mL/min (ref >60)
GFR calc non Af Amer: 59 mL/min — ABNORMAL LOW (ref >60)
Glucose, Bld: 103 mg/dL — ABNORMAL HIGH (ref 70–99)
Potassium: 4.4 mmol/L (ref 3.5–5.1)
Sodium: 141 mmol/L (ref 135–145)
Total Bilirubin: 0.4 mg/dL (ref 0.3–1.2)
Total Protein: 6.4 g/dL — ABNORMAL LOW (ref 6.5–8.1)

## 2019-09-22 MED ORDER — SODIUM CHLORIDE (PF) 0.9 % IJ SOLN
INTRAMUSCULAR | Status: AC
  Filled 2019-09-22: qty 50

## 2019-09-22 MED ORDER — IOHEXOL 300 MG/ML  SOLN
100.0000 mL | Freq: Once | INTRAMUSCULAR | Status: AC | PRN
  Administered 2019-09-22: 100 mL via INTRAVENOUS

## 2019-09-22 MED FILL — Fosaprepitant Dimeglumine For IV Infusion 150 MG (Base Eq): INTRAVENOUS | Qty: 5 | Status: AC

## 2019-09-22 MED FILL — Dexamethasone Sodium Phosphate Inj 100 MG/10ML: INTRAMUSCULAR | Qty: 1 | Status: AC

## 2019-09-23 ENCOUNTER — Inpatient Hospital Stay: Payer: 59

## 2019-09-23 ENCOUNTER — Other Ambulatory Visit: Payer: Self-pay

## 2019-09-23 ENCOUNTER — Inpatient Hospital Stay: Payer: 59 | Admitting: Hematology and Oncology

## 2019-09-23 ENCOUNTER — Ambulatory Visit: Payer: 59

## 2019-09-23 ENCOUNTER — Encounter: Payer: Self-pay | Admitting: Hematology and Oncology

## 2019-09-23 ENCOUNTER — Other Ambulatory Visit (HOSPITAL_COMMUNITY): Payer: Self-pay | Admitting: Internal Medicine

## 2019-09-23 ENCOUNTER — Inpatient Hospital Stay (HOSPITAL_BASED_OUTPATIENT_CLINIC_OR_DEPARTMENT_OTHER): Payer: 59 | Admitting: Hematology and Oncology

## 2019-09-23 DIAGNOSIS — Z7189 Other specified counseling: Secondary | ICD-10-CM | POA: Diagnosis not present

## 2019-09-23 DIAGNOSIS — C569 Malignant neoplasm of unspecified ovary: Secondary | ICD-10-CM

## 2019-09-23 DIAGNOSIS — D61818 Other pancytopenia: Secondary | ICD-10-CM | POA: Diagnosis not present

## 2019-09-23 LAB — INHIBIN B: Inhibin B: 95.9 pg/mL — ABNORMAL HIGH (ref 0.0–16.9)

## 2019-09-23 MED FILL — OMEPRAZOLE 20 MG CAP: 20 | 90 days supply | Qty: 90 | Fill #0

## 2019-09-23 NOTE — Progress Notes (Signed)
[place] OFFICE PROGRESS NOTE  Patient Care Team: [name], MD as PCP - General (Internal Medicine)  ASSESSMENT & PLAN:  Malignant granulosa cell tumor of ovary [name] I reviewed multiple imaging studies with the patient Unfortunately, she has signs of disease progression Clinically, she is not symptomatic We discussed the reason why repeat surgery is not indicated We discussed the risk and benefits of performing additional biopsy for molecular studies We also reviewed the current guidelines and treatment options We discussed the risk and benefits of clinical trial  She is leaning towards single agent bevacizumab which is reasonable choice We discussed risks, benefits, side effects of single agent bevacizumab including risk of hypertension, proteinuria, poor wound healing, risk of bleeding and blood clots  Given history of poor venous access, I recommend port placement before we proceed with therapy We also discussed the risk and benefits of prophylactic anticoagulation therapy The patient seems to be somewhat hesitant about starting chemotherapy so soon She has plans made for the summer and would like to wait until after mid July I told her, in general, it is not recommended to wait more than a month after CT imaging due to potential growth between 1 imaging study to another She will think about her decision after discussion with the family and will get back to me with her final plan   Pancytopenia, acquired [facility]) She has profound pancytopenia due to recent chemotherapy but she is not symptomatic Observe only for now   Goals of care, counseling/discussion We have multiple goals of care discussions in the past She is aware that treatment goal is palliative Currently, she is not symptomatic and would like to hold off treatment   No orders of the defined types were placed in this encounter.   All questions were answered. The patient knows to call the clinic  with any problems, questions or concerns. The total time spent in the appointment was 40 minutes encounter with patients including review of chart and various tests results, discussions about plan of care and coordination of care plan   Heath Lark, MD 09/23/2019 4:43 PM  INTERVAL HISTORY: Please see below for problem oriented charting. She returns to review test results She denies major complication from recent chemotherapy She denies abdominal pain or changes in bowel habits No recent infection, fever or chills The patient denies any recent signs or symptoms of bleeding such as spontaneous epistaxis, hematuria or hematochezia.   SUMMARY OF ONCOLOGIC HISTORY: Oncology History Overview Note  2018 - Core biopsy for ER/PR and Foundation One testing performed. Unfortunately, no sufficient tissue for Foundation One testing. ER was 50% and PR 90%. Progressed on carboplatin, Tamoxifen/Megace and letrozole, mixed response on Lupron  AMH: 06/23/19: 108 05/26/19: 9.04 01/05/19: 6.84 09/02/18: 3.72 06/01/18: 3.84 02/24/18: 2.6 11/21/17: 3.26 08/26/17: 1.77 05/27/17: 2.12 01/28/17: 2.47 06/10/16: 2.68 02/06/16: 1.84 05/12/15: 3.27 10/03/14: 2.12  Inhibin B 09/22/2019: 95.9 05/26/19: 90.2 01/05/19: 92 09/02/18: 70.9 06/04/18: 70 02/24/18: 79.5 11/21/17: 85.8 08/26/17: 65.6 05/27/17: 58.9 01/28/17: 198 06/10/16: 118.1 02/06/16: 62.4 05/12/15: 64.6 10/03/14: 58   Malignant granulosa cell tumor of ovary ([place])  1994 Initial Diagnosis   1994   2002 Relapse/Recurrence   Upper abdominal recurrence, resected.     - 09/2000 Chemotherapy   6 cycles of IP cisplatin and etoposide    02/2009 Relapse/Recurrence   CT showed increased size of nodules in pelvis   2010 Surgery   Exlap with section of tumor nodules near cecum and left pelvic sidewall.  Tumor: ER negative, PR positive    Treatment Plan Change   Alternated 2 week courses of Megace and Tamoxifen - ended 03/2012   03/2012 PET scan   CT - progressive  disease   2013 Treatment Plan Change   Letrozole   01/2016 Imaging   MRI showed progressive disease   2017 Treatment Plan Change   Two weeks of alternating Tamoxifen 20mg  daily and then Megace 40mg  TID   08/2016 Imaging   MRI showed progressive disease with peritoneal implants near liver, in pelvis   01/2017 Treatment Plan Change   Lupron 11.25 q 3 months   11/2017 Imaging   Overall mixed response.   Mixed cystic/solid lesions in the left pelvis are mildly improved.   Cystic peritoneal disease, including the dominant lesion along the posterior right hepatic lobe, is mildly progressed.   Subcapsular lesion along the posterior right hepatic lobe is unchanged.   03/2018 Imaging   MRI A/P: Mixed response of individual peritoneal metastases in the pelvis, as described above. Overall, there has been no significant change in bulk of disease.   Stable cystic peritoneal metastatic disease along the capsular surfaces of the liver and spleen.   No new sites of metastatic disease identified within the abdomen or pelvis.   08/2018 Imaging   MRI A/P: Status post hysterectomy and bilateral salpingo-oophorectomy.   Mixed cystic/solid peritoneal implants in the abdomen/pelvis, as above. Dominant cystic implant along the posterior liver surface is mildly increased. Remaining lesions are overall grossly unchanged.   No new lesions are identified.   01/28/2019 Imaging   Mri A/P: 1. Relatively similar appearance of peritoneal metastasis. A posterior right hepatic capsular based lesion is similar to minimally decreased in size. Left pelvic implants are primarily similar with possible enlargement of an anterior high left pelvic cystic implant. No new disease identified. 2.  Aortic Atherosclerosis (ICD10-I70.0). 3. Hepatic steatosis. 4. Left adrenal adenoma.   06/02/2019 Imaging   MRI 1. Potential slight enlargement of dominant cystic area and solid component, associated with rind like signal  variation on T2 along the inferior right hepatic margin, also potentially slightly increased. Findings may still be within the realm of measurement and technical variation. Close attention on follow-up. 2. Subtle cystic changes along the cephalad margin of the spleen are difficult to see on previous imaging, perhaps new compared with prior imaging studies. 3. Pelvic implants and left lower quadrant lesion with similar size, of the area along the left iliac vasculature may be slightly larger than on the prior study. 4. Signs of extensive retroperitoneal and pelvic lymphadenectomy. 5. Hepatic steatosis. 6. 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     06/08/2019 Cancer Staging   Staging form: Ovary, AJCC 7th Edition - Clinical: Stage IIIC (rT2, N1, M0) - Signed by Heath Lark, MD on 06/08/2019   06/10/2019 Imaging   1. 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 These findings are slightly worsened in comparison to a remote prior CT examination dated 10/04/2014.   2.  No evidence of metastatic disease in the chest.   3. Status post hysterectomy, pelvic and retroperitoneal lymph node dissection, and ventral hernia mesh repair.   4.  Hepatic steatosis.   5.  Aortic Atherosclerosis (ICD10-I70.0).   06/24/2019 - 09/02/2019 Chemotherapy   The patient had  carboplatin for chemotherapy treatment.     09/02/2019 Tumor Marker   Patient's tumor was tested for the following markers: Inhibin B Results of the tumor marker test revealed 94   09/23/2019 Imaging   1. Interval progression of the soft tissue lesions in the anterior left pelvis, likely peritoneal implants, compatible with disease progression. Remaining sites of apparent disease along the liver capsule and posterior spleen  are stable 2. Stable 2 cm left adrenal adenoma. 3. Hepatic steatosis. 4. Right-side predominant colonic diverticulosis without diverticulitis. 5. Aortic Atherosclerosis (ICD10-I70.0).     REVIEW OF SYSTEMS:   Constitutional: Denies fevers, chills or abnormal weight loss Eyes: Denies blurriness of vision Ears, nose, mouth, throat, and face: Denies mucositis or sore throat Respiratory: Denies cough, dyspnea or wheezes Cardiovascular: Denies palpitation, chest discomfort or lower extremity swelling Gastrointestinal:  Denies nausea, heartburn or change in bowel habits Skin: Denies abnormal skin rashes Lymphatics: Denies new lymphadenopathy or easy bruising Neurological:Denies numbness, tingling or new weaknesses Behavioral/Psych: Mood is stable, no new changes  All other systems were reviewed with the patient and are negative.  I have reviewed the past medical history, past surgical history, social history and family history with the patient and they are unchanged from previous note.  ALLERGIES:  is allergic to codeine; oxycodone; sodium thiosalicylate; ciprofloxacin; codone [hydrocodone bitartrate]; daypro [oxaprozin]; levofloxacin; stadol [butorphanol tartrate]; and sulfa antibiotics.  MEDICATIONS:  Current Outpatient Medications  Medication Sig Dispense Refill   Biotin 10 MG CAPS Take by mouth.     Calcium Citrate 200 MG TABS Take by mouth.     CARBOplatin 1000 MG/100ML SOLN Inject into the vein.     escitalopram (LEXAPRO) 20 MG tablet Take 10 mg by mouth daily.      metFORMIN (GLUCOPHAGE) 1000 MG tablet Take 1 tablet (1,000 mg total) by mouth 2 (two) times daily with a meal. 60 tablet 0   Multiple Vitamin (MULTIVITAMIN) capsule Take 1 capsule by mouth daily.     Naltrexone-buPROPion HCl ER (CONTRAVE) 8-90 MG TB12 Take 2 tablets by mouth daily. 120 tablet 0   naproxen (NAPROSYN) 500 MG tablet Take 500 mg by mouth 2 (two) times daily with a meal.     omeprazole (PRILOSEC)  20 MG capsule Take 20 mg by mouth daily.      ondansetron (ZOFRAN) 8 MG tablet Take 1 tablet (8 mg total) by mouth every 8 (eight) hours as needed for refractory nausea / vomiting. Start on day 3 after carboplatin chemo. 30 tablet 1   Probiotic Product (ALIGN PO) Take 1 tablet by mouth daily.      prochlorperazine (COMPAZINE) 10 MG tablet Take 1 tablet (10 mg total) by mouth every 6 (six) hours as needed (Nausea or vomiting). 30 tablet 1   Vitamin D, Ergocalciferol, (DRISDOL) 1.25 MG (50000 UNIT) CAPS capsule Take 1 capsule (50,000 Units total) by mouth every 7 (seven) days. 4 capsule 0   No current facility-administered medications for this visit.    PHYSICAL EXAMINATION: ECOG PERFORMANCE STATUS: 0 - Asymptomatic  Vitals:   09/23/19 1535  BP: 115/63  Pulse: 62  Resp: 17  Temp: 98.2 F (36.8 C)  SpO2: 100%   Filed Weights   09/23/19 1535  Weight: 200 lb 3.2 oz (90.8 kg)    GENERAL:alert, no distress and comfortable  LABORATORY DATA:  I have reviewed the data as listed    Component Value Date/Time   NA 141 09/22/2019 0833   NA 141 05/31/2019 0910   NA 141  09/24/2013 1131   K 4.4 09/22/2019 0833   K 4.5 09/24/2013 1131   CL 106 09/22/2019 0833   CO2 23 09/22/2019 0833   CO2 24 09/24/2013 1131   GLUCOSE 103 (H) 09/22/2019 0833   GLUCOSE 90 09/24/2013 1131   BUN 27 (H) 09/22/2019 0833   BUN 18 05/31/2019 0910   BUN 21.2 09/24/2013 1131   CREATININE 1.02 (H) 09/22/2019 0833   CREATININE 1.18 (H) 09/02/2018 0917   CREATININE 1.1 09/24/2013 1131   CALCIUM 9.1 09/22/2019 0833   CALCIUM 9.5 09/24/2013 1131   PROT 6.4 (L) 09/22/2019 0833   PROT 6.6 05/31/2019 0910   PROT 6.8 09/24/2013 1131   ALBUMIN 3.7 09/22/2019 0833   ALBUMIN 4.5 05/31/2019 0910   ALBUMIN 3.9 09/24/2013 1131   AST 16 09/22/2019 0833   AST 23 09/24/2013 1131   ALT 19 09/22/2019 0833   ALT 27 09/24/2013 1131   ALKPHOS 60 09/22/2019 0833   ALKPHOS 81 09/24/2013 1131   BILITOT 0.4 09/22/2019  0833   BILITOT 0.3 05/31/2019 0910   BILITOT 0.53 09/24/2013 1131   GFRNONAA 59 (L) 09/22/2019 0833   GFRNONAA 50 (L) 09/02/2018 0917   GFRAA >60 09/22/2019 0833   GFRAA 58 (L) 09/02/2018 0917    No results found for: SPEP, UPEP  Lab Results  Component Value Date   WBC 2.6 (L) 09/22/2019   NEUTROABS 1.1 (L) 09/22/2019   HGB 10.1 (L) 09/22/2019   HCT 29.3 (L) 09/22/2019   MCV 100.3 (H) 09/22/2019   PLT 86 (L) 09/22/2019      Chemistry      Component Value Date/Time   NA 141 09/22/2019 0833   NA 141 05/31/2019 0910   NA 141 09/24/2013 1131   K 4.4 09/22/2019 0833   K 4.5 09/24/2013 1131   CL 106 09/22/2019 0833   CO2 23 09/22/2019 0833   CO2 24 09/24/2013 1131   BUN 27 (H) 09/22/2019 0833   BUN 18 05/31/2019 0910   BUN 21.2 09/24/2013 1131   CREATININE 1.02 (H) 09/22/2019 0833   CREATININE 1.18 (H) 09/02/2018 0917   CREATININE 1.1 09/24/2013 1131      Component Value Date/Time   CALCIUM 9.1 09/22/2019 0833   CALCIUM 9.5 09/24/2013 1131   ALKPHOS 60 09/22/2019 0833   ALKPHOS 81 09/24/2013 1131   AST 16 09/22/2019 0833   AST 23 09/24/2013 1131   ALT 19 09/22/2019 0833   ALT 27 09/24/2013 1131   BILITOT 0.4 09/22/2019 0833   BILITOT 0.3 05/31/2019 0910   BILITOT 0.53 09/24/2013 1131       RADIOGRAPHIC STUDIES: I have reviewed multiple imaging studies with her I have personally reviewed the radiological images as listed and agreed with the findings in the report. CT ABDOMEN PELVIS W CONTRAST  Result Date: 09/23/2019 CLINICAL DATA:  Malignant granulosa cell tumor of the ovary with recurrence. Prior total abdominal hysterectomy and appendectomy. Restaging. EXAM: CT ABDOMEN AND PELVIS WITH CONTRAST TECHNIQUE: Multidetector CT imaging of the abdomen and pelvis was performed using the standard protocol following bolus administration of intravenous contrast. CONTRAST:  156mL OMNIPAQUE IOHEXOL 300 MG/ML  SOLN COMPARISON:  06/09/2019. FINDINGS: Lower chest: Unremarkable.  Hepatobiliary: The liver shows diffusely decreased attenuation suggesting fat deposition. No suspicious focal abnormality within the liver parenchyma. There is no evidence for gallstones, gallbladder wall thickening, or pericholecystic fluid. No intrahepatic or extrahepatic biliary dilation. Pancreas: No focal mass lesion. No dilatation of the main duct. No intraparenchymal cyst. No peripancreatic  edema. Spleen: No splenomegaly. No focal mass lesion. Adrenals/Urinary Tract: Right adrenal gland unremarkable. 2 cm left adrenal nodule is stable in size with relative washout value compatible with adenoma. Right kidney unremarkable. Similar appearance 2 cm lower pole cyst left kidney. No evidence for hydroureter. The urinary bladder appears normal for the degree of distention. Stomach/Bowel: Stomach is unremarkable. No gastric wall thickening. No evidence of outlet obstruction. Duodenum is normally positioned as is the ligament of Treitz. No small bowel wall thickening. No small bowel dilatation. The terminal ileum is normal. The appendix is not visualized, but there is no edema or inflammation in the region of the cecum. No gross colonic mass. No colonic wall thickening. Diverticular disease is right-side predominant without diverticulitis. Vascular/Lymphatic: There is abdominal aortic atherosclerosis without aneurysm. There is no gastrohepatic or hepatoduodenal ligament lymphadenopathy. No retroperitoneal or mesenteric lymphadenopathy. Scattered implants along the surface of the liver are similar to prior including 11 mm posterior right capsular implant on 25/2. Cystic and solid lesion along the medial liver capsule measured previously at 4 cm is 4.1 cm today, not substantially changed. Small low-density lesion along the posterior spleen is stable. The left peritoneal soft tissue versus external iliac lymph node (73/2) has progressed in the interval, measuring 2.7 x 2.1 cm today compared to 1.8 x 1.4 cm previously. A  second soft tissue nodule in this vicinity (63/2 is probably peritoneal measuring 2.1 x 1.6 cm today compared to 1.6 x 1.5 cm previously. Reproductive: Uterus surgically absent.  There is no adnexal mass. Other: No intraperitoneal free fluid. Musculoskeletal: Status post ventral hernia repair with mesh placement. Tiny recurrent hernia identified along the inferior margin of the mesh. Degenerative changes noted both hips. No worrisome lytic or sclerotic osseous abnormality. IMPRESSION: 1. Interval progression of the soft tissue lesions in the anterior left pelvis, likely peritoneal implants, compatible with disease progression. Remaining sites of apparent disease along the liver capsule and posterior spleen are stable 2. Stable 2 cm left adrenal adenoma. 3. Hepatic steatosis. 4. Right-side predominant colonic diverticulosis without diverticulitis. 5. Aortic Atherosclerosis (ICD10-I70.0). Electronically Signed   By: Misty Stanley M.D.   On: 09/23/2019 12:53

## 2019-09-23 NOTE — Assessment & Plan Note (Signed)
I reviewed multiple imaging studies with the patient Unfortunately, she has signs of disease progression Clinically, she is not symptomatic We discussed the reason why repeat surgery is not indicated We discussed the risk and benefits of performing additional biopsy for molecular studies We also reviewed the current guidelines and treatment options We discussed the risk and benefits of clinical trial  She is leaning towards single agent bevacizumab which is reasonable choice We discussed risks, benefits, side effects of single agent bevacizumab including risk of hypertension, proteinuria, poor wound healing, risk of bleeding and blood clots  Given history of poor venous access, I recommend port placement before we proceed with therapy We also discussed the risk and benefits of prophylactic anticoagulation therapy The patient seems to be somewhat hesitant about starting chemotherapy so soon She has plans made for the summer and would like to wait until after mid July I told her, in general, it is not recommended to wait more than a month after CT imaging due to potential growth between 1 imaging study to another She will think about her decision after discussion with the family and will get back to me with her final plan

## 2019-09-23 NOTE — Assessment & Plan Note (Signed)
We have multiple goals of care discussions in the past She is aware that treatment goal is palliative Currently, she is not symptomatic and would like to hold off treatment

## 2019-09-23 NOTE — Assessment & Plan Note (Signed)
She has profound pancytopenia due to recent chemotherapy but she is not symptomatic Observe only for now

## 2019-09-30 ENCOUNTER — Telehealth: Payer: Self-pay | Admitting: Hematology and Oncology

## 2019-09-30 ENCOUNTER — Other Ambulatory Visit: Payer: Self-pay | Admitting: Hematology and Oncology

## 2019-09-30 DIAGNOSIS — C569 Malignant neoplasm of unspecified ovary: Secondary | ICD-10-CM

## 2019-09-30 DIAGNOSIS — Z7189 Other specified counseling: Secondary | ICD-10-CM

## 2019-09-30 NOTE — Progress Notes (Signed)
DISCONTINUE ON PATHWAY REGIMEN - Ovarian     A cycle is every 21 days:     Carboplatin   **Always confirm dose/schedule in your pharmacy ordering system**  REASON: Disease Progression PRIOR TREATMENT: [id number]: Carboplatin AUC=6 q21 Days; Stop Treatment After 6 Cycles If Complete Response, Otherwise Continue Treatment Until Progression or Unacceptable Toxicity TREATMENT RESPONSE: Progressive Disease (PD)  START OFF PATHWAY REGIMEN - Ovarian   [id number]:Bevacizumab 15 mg/kg q21d:   A cycle is every 21 days:     Bevacizumab-xxxx   **Always confirm dose/schedule in your pharmacy ordering system**  Patient Characteristics: Recurrent or Progressive Disease, Third Line, Platinum Resistant or < 6 Months Since Last Platinum Therapy Therapeutic Status: Recurrent or Progressive Disease BRCA Mutation Status: Absent Line of Therapy: Third Line  Intent of Therapy: Non-Curative / Palliative Intent, Discussed with Patient

## 2019-09-30 NOTE — Telephone Encounter (Signed)
Scheduled appt per 6/10 sch message - pt  Is aware .

## 2019-10-07 ENCOUNTER — Encounter (HOSPITAL_COMMUNITY): Payer: Self-pay

## 2019-10-07 ENCOUNTER — Emergency Department (HOSPITAL_COMMUNITY)
Admission: EM | Admit: 2019-10-07 | Discharge: 2019-10-07 | Discharge status: Against Medical Advice | Payer: 59 | Attending: Emergency Medicine | Admitting: Emergency Medicine

## 2019-10-07 ENCOUNTER — Telehealth: Payer: Self-pay

## 2019-10-07 ENCOUNTER — Other Ambulatory Visit: Payer: Self-pay

## 2019-10-07 DIAGNOSIS — R1010 Upper abdominal pain, unspecified: Secondary | ICD-10-CM | POA: Insufficient documentation

## 2019-10-07 DIAGNOSIS — R110 Nausea: Secondary | ICD-10-CM | POA: Diagnosis not present

## 2019-10-07 DIAGNOSIS — Z5321 Procedure and treatment not carried out due to patient leaving prior to being seen by health care provider: Secondary | ICD-10-CM | POA: Insufficient documentation

## 2019-10-07 MED ORDER — SODIUM CHLORIDE 0.9% FLUSH: 3.0000 mL | Freq: Once | INTRAVENOUS | Status: DC

## 2019-10-07 NOTE — Telephone Encounter (Signed)
She went to the ER and was not seen. She has now left the ER. Her pain/cramping is less frequent. Episodes are about every hour. Pain is less intense when she has the episodes. She will go to urgent care if she starts to feel worse.  Instructed per Dr. Alvy Bimler to stay NPO and call back in the am at 8 am with update. She verbalized understanding.

## 2019-10-07 NOTE — ED Triage Notes (Signed)
Patient reports that she woke with upper abdominal pain . Patient states the pain ws coming every 5 minutes and now it is much farther apart when episodes occur. Patient also reports that she had a normal BM. patient also c/o nausea, but no emesis.  Patient states her last chemo treatment was 4 weeks ago.

## 2019-10-07 NOTE — Telephone Encounter (Signed)
She called and left a message to call her.  Called back. She is complaining of intense abdominal cramping that is coming about every 5 minutes. The pain is very intense and feels like a bowel obstruction. She has had a bowel obstruction in the past.  Given above message to Dr. Alvy Bimler. Instructed per Dr. Alvy Bimler, to go to the ER now to be evaluated. She verbalized understanding and will go to ER.

## 2019-10-07 NOTE — ED Notes (Signed)
Patient refused lab draw. States " I am leaving." S. Melina Modena, RN made aware.

## 2019-10-08 ENCOUNTER — Telehealth: Payer: Self-pay

## 2019-10-08 NOTE — Telephone Encounter (Signed)
Called for update. She is feeling better. Cramping sensation has resolved. She is doing clear liquids and will progress to regular foods. She is back at work today. Instructed to call the office back if needed or appt needed. She verbalized understanding.  FYI

## 2019-10-13 MED FILL — ESCITALOPRAM 20 MG TABLET: 20 | 90 days supply | Qty: 90 | Fill #1

## 2019-11-03 ENCOUNTER — Ambulatory Visit (INDEPENDENT_AMBULATORY_CARE_PROVIDER_SITE_OTHER): Payer: 59 | Admitting: Adult Health

## 2019-11-03 ENCOUNTER — Other Ambulatory Visit: Payer: Self-pay

## 2019-11-03 ENCOUNTER — Encounter (INDEPENDENT_AMBULATORY_CARE_PROVIDER_SITE_OTHER): Payer: Self-pay | Admitting: Adult Health

## 2019-11-03 VITALS — BP 113/68 | HR 55 | Temp 98.0°F | Ht 66.0 in | Wt 191.0 lb

## 2019-11-03 DIAGNOSIS — E559 Vitamin D deficiency, unspecified: Secondary | ICD-10-CM

## 2019-11-03 DIAGNOSIS — Z9189 Other specified personal risk factors, not elsewhere classified: Secondary | ICD-10-CM

## 2019-11-03 DIAGNOSIS — Z6830 Body mass index (BMI) 30.0-30.9, adult: Secondary | ICD-10-CM | POA: Diagnosis not present

## 2019-11-03 DIAGNOSIS — E669 Obesity, unspecified: Secondary | ICD-10-CM | POA: Diagnosis not present

## 2019-11-03 DIAGNOSIS — R7303 Prediabetes: Secondary | ICD-10-CM | POA: Diagnosis not present

## 2019-11-03 MED ORDER — METFORMIN HCL 1000 MG PO TABS: 1000.0000 mg | ORAL_TABLET | Freq: Two times a day (BID) | ORAL | 0 refills | Status: DC

## 2019-11-03 MED ORDER — VITAMIN D (ERGOCALCIFEROL) 1.25 MG (50000 UNIT) PO CAPS: 50000.0000 [IU] | ORAL_CAPSULE | ORAL | 0 refills | Status: DC

## 2019-11-03 MED FILL — METFORMIN HCL 1000 MG TABS: 1000 | 30 days supply | Qty: 60 | Fill #0

## 2019-11-03 MED FILL — VIT D2 1.25 MG (50,000 UNIT: 1.25 MG | 28 days supply | Qty: 4 | Fill #0

## 2019-11-04 DIAGNOSIS — R7303 Prediabetes: Secondary | ICD-10-CM | POA: Insufficient documentation

## 2019-11-04 NOTE — Progress Notes (Signed)
Chief Complaint:   OBESITY [name] is here to discuss her progress with her obesity treatment plan along with follow-up of her obesity related diagnoses. [name] is keeping a food journal and adhering to recommended goals of 1200 calories and 85 grams of protein and states she is following her eating plan approximately 50% of the time. [name] states she is swimming/treadmill 30-60 minutes 1 time per week.  Today's visit was #: 25 Starting weight: 209 lbs Starting date: 04/29/2018 Today's weight: 191 lbs Today's date: 11/03/2019 Total lbs lost to date: 18 Total lbs lost since last in-office visit: 2  Interim History: [name] estimates to track 50% of the time and hitting calorie and protein goals about 50-75% of the time. She reports mild polyphagia in the afternoon and states she has only been 1 tab of Contrave daily - not 2 tabs QAM as ordered.  Subjective:   Vitamin D deficiency. [name] is on prescription strength Vitamin D supplementation and denies nausea, vomiting, or muscle weakness.    Ref. Range 05/31/2019 09:10  Vitamin D, 25-Hydroxy Latest Ref Range: 30.0 - 100.0 ng/mL 54.0   Prediabetes. [name] has a diagnosis of prediabetes based on her elevated HgA1c and was informed this puts her at greater risk of developing diabetes. She continues to work on diet and exercise to decrease her risk of diabetes. She denies nausea or hypoglycemia. [name] is on metformin 1000 mg BID and denies GI upset.  Lab Results  Component Value Date   HGBA1C 5.3 05/31/2019   Lab Results  Component Value Date   INSULIN 16.5 05/31/2019   INSULIN 17.6 11/19/2018   INSULIN 25.8 (H) 04/29/2018   At risk for diabetes mellitus. [name] is at higher than average risk for developing diabetes due to prediabetes and obesity.   Assessment/Plan:   Vitamin D deficiency. Low Vitamin D level contributes to fatigue and are associated with obesity, breast, and colon cancer. She was given a refill on her Vitamin D,  Ergocalciferol, (DRISDOL) 1.25 MG (50000 UNIT) CAPS capsule every week #4 with 0 refills and will follow-up for routine testing of Vitamin D at her next office visit.   Prediabetes. [name] will continue to work on weight loss, exercise, and decreasing simple carbohydrates to help decrease the risk of diabetes. Refill was given for metFORMIN (GLUCOPHAGE) 1000 MG tablet BID #60 with 0 refills. Labs will be checked at her next office visit.  At risk for diabetes mellitus. [name] was given approximately 15 minutes of diabetes education and counseling today. We discussed intensive lifestyle modifications today with an emphasis on weight loss as well as increasing exercise and decreasing simple carbohydrates in her diet. We also reviewed medication options with an emphasis on risk versus benefit of those discussed.   Repetitive spaced learning was employed today to elicit superior memory formation and behavioral change.  Class 1 obesity with serious comorbidity and body mass index (BMI) of 30.0 to 30.9 in adult, unspecified obesity type.  [name] is currently in the action stage of change. As such, her goal is to continue with weight loss efforts. She has agreed to keeping a food journal and adhering to recommended goals of 1200 calories and 85+ grams of protein daily.   Exercise goals: [name] will continue her current exercise regimen.   Behavioral modification strategies: increasing lean protein intake, meal planning and cooking strategies, planning for success and keeping a strict food journal.  [name] has agreed to follow-up with our clinic in 3 weeks.  She was informed of the importance of frequent follow-up visits to maximize her success with intensive lifestyle modifications for her multiple health conditions.   Objective:   Blood pressure 113/68, pulse (!) 55, temperature 98 F (36.7 C), temperature source Oral, height 5\' 6"  (1.676 m), weight 191 lb (86.6 kg), SpO2 98 %. Body mass index is 30.83  kg/m.  General: Cooperative, alert, well developed, in no acute distress. HEENT: Conjunctivae and lids unremarkable. Cardiovascular: Regular rhythm.  Lungs: Normal work of breathing. Neurologic: No focal deficits.   Lab Results  Component Value Date   CREATININE 1.02 (H) 09/22/2019   BUN 27 (H) 09/22/2019   NA 141 09/22/2019   K 4.4 09/22/2019   CL 106 09/22/2019   CO2 23 09/22/2019   Lab Results  Component Value Date   ALT 19 09/22/2019   AST 16 09/22/2019   ALKPHOS 60 09/22/2019   BILITOT 0.4 09/22/2019   Lab Results  Component Value Date   HGBA1C 5.3 05/31/2019   HGBA1C 5.2 11/19/2018   HGBA1C 5.5 04/29/2018   Lab Results  Component Value Date   INSULIN 16.5 05/31/2019   INSULIN 17.6 11/19/2018   INSULIN 25.8 (H) 04/29/2018   Lab Results  Component Value Date   TSH 0.564 04/29/2018   Lab Results  Component Value Date   CHOL 230 (H) 05/31/2019   HDL 50 05/31/2019   LDLCALC 142 (H) 05/31/2019   TRIG 211 (H) 05/31/2019   Lab Results  Component Value Date   WBC 2.6 (L) 09/22/2019   HGB 10.1 (L) 09/22/2019   HCT 29.3 (L) 09/22/2019   MCV 100.3 (H) 09/22/2019   PLT 86 (L) 09/22/2019   No results found for: IRON, TIBC, FERRITIN  Attestation Statements:   Reviewed by clinician on day of visit: allergies, medications, problem list, medical history, surgical history, family history, social history, and previous encounter notes.  I, Michaelene Song, am acting as Location manager for [employer], NP-C   I have reviewed the above documentation for accuracy and completeness, and I agree with the above. -  Esaw Grandchild, NP

## 2019-11-09 MED FILL — MYRBETRIQ ER 50 MG TABLET: 50 | 30 days supply | Qty: 30 | Fill #0

## 2019-11-12 DIAGNOSIS — Z20828 Contact with and (suspected) exposure to other viral communicable diseases: Secondary | ICD-10-CM | POA: Diagnosis not present

## 2019-11-23 ENCOUNTER — Encounter: Payer: Self-pay | Admitting: Hematology and Oncology

## 2019-11-30 ENCOUNTER — Encounter (INDEPENDENT_AMBULATORY_CARE_PROVIDER_SITE_OTHER): Payer: Self-pay | Admitting: Adult Health

## 2019-11-30 ENCOUNTER — Ambulatory Visit (INDEPENDENT_AMBULATORY_CARE_PROVIDER_SITE_OTHER): Payer: 59 | Admitting: Adult Health

## 2019-11-30 ENCOUNTER — Other Ambulatory Visit: Payer: Self-pay

## 2019-11-30 VITALS — BP 96/67 | HR 59 | Temp 97.5°F | Ht 66.0 in | Wt 191.0 lb

## 2019-11-30 DIAGNOSIS — Z6830 Body mass index (BMI) 30.0-30.9, adult: Secondary | ICD-10-CM | POA: Diagnosis not present

## 2019-11-30 DIAGNOSIS — R7303 Prediabetes: Secondary | ICD-10-CM

## 2019-11-30 DIAGNOSIS — E7849 Other hyperlipidemia: Secondary | ICD-10-CM

## 2019-11-30 DIAGNOSIS — E559 Vitamin D deficiency, unspecified: Secondary | ICD-10-CM | POA: Diagnosis not present

## 2019-11-30 DIAGNOSIS — E669 Obesity, unspecified: Secondary | ICD-10-CM

## 2019-11-30 DIAGNOSIS — N183 Chronic kidney disease, stage 3 unspecified: Secondary | ICD-10-CM | POA: Diagnosis not present

## 2019-11-30 DIAGNOSIS — Z9189 Other specified personal risk factors, not elsewhere classified: Secondary | ICD-10-CM

## 2019-11-30 DIAGNOSIS — E66811 Obesity, class 1: Secondary | ICD-10-CM

## 2019-11-30 MED ORDER — METFORMIN HCL 1000 MG PO TABS: ORAL_TABLET | ORAL | 0 refills | Status: DC

## 2019-11-30 MED ORDER — CONTRAVE 8-90 MG PO TB12: 2.0000 | ORAL_TABLET | Freq: Every day | ORAL | 0 refills | Status: DC

## 2019-11-30 MED ORDER — VITAMIN D (ERGOCALCIFEROL) 1.25 MG (50000 UNIT) PO CAPS: 50000.0000 [IU] | ORAL_CAPSULE | ORAL | 0 refills | Status: DC

## 2019-11-30 MED FILL — CONTRAVE ER 8-90 MG TABLET: 8-90 | 60 days supply | Qty: 120 | Fill #0

## 2019-11-30 MED FILL — METFORMIN HCL 1000 MG TABS: 1000 | 60 days supply | Qty: 60 | Fill #0

## 2019-11-30 MED FILL — VIT D2 1.25 MG (50,000 UNIT: 1.25 MG | 28 days supply | Qty: 4 | Fill #0

## 2019-11-30 NOTE — Progress Notes (Signed)
Chief Complaint:   OBESITY [name] is here to discuss her progress with her obesity treatment plan along with follow-up of her obesity related diagnoses. [name] is keeping a food journal and adhering to recommended goals of 1200 calories and 85+ grams of protein and states she is following her eating plan approximately 70% of the time. [name] states she is swimming/cycling/weights 30 minutes 4 times per week.  Today's visit was #: 12 Starting weight: 209 lbs Starting date: 04/29/2018 Today's weight: 191 lbs Today's date: 11/30/2019 Total lbs lost to date: 18 Total lbs lost since last in-office visit: 0  Interim History: [name] estimates to hit her protein and calorie goals and tracking both 70% of the time. She has been busy with work and will focus on protein at each meal when short on time to eat. She reports resolution of polyphagia since increasing Contrave to 2 tabs QAM. Blood pressure is excellent today at 96/67.  Subjective:   Prediabetes. [name] has a diagnosis of prediabetes based on her elevated HgA1c and was informed this puts her at greater risk of developing diabetes. She continues to work on diet and exercise to decrease her risk of diabetes. She denies nausea or hypoglycemia. [name] is on metformin 1000 mg BID. B12 in January 2020 was within normal limits - needs to be updated today. She denies polyphagia and A1c has been normal over the last several checks (5.3-5.5).  She is agreeable to decreasing metformin.  Lab Results  Component Value Date   HGBA1C 5.3 05/31/2019   Lab Results  Component Value Date   INSULIN 16.5 05/31/2019   INSULIN 17.6 11/19/2018   INSULIN 25.8 (H) 04/29/2018   Vitamin D deficiency. [name] is on Ergocalciferol. No nausea, vomiting, or muscle weakness.    Ref. Range 05/31/2019 09:10  Vitamin D, 25-Hydroxy Latest Ref Range: 30.0 - 100.0 ng/mL 54.0   Stage 3 chronic kidney disease, unspecified whether stage 3a or 3b CKD. GFR has been hovering  in the mid to upper 50's over the last several months.  Other hyperlipidemia, mixed. [name] is not on a statin. LDL and total have both been above goal at the last 2 checks.   Lab Results  Component Value Date   CHOL 230 (H) 05/31/2019   HDL 50 05/31/2019   LDLCALC 142 (H) 05/31/2019   TRIG 211 (H) 05/31/2019   Lab Results  Component Value Date   ALT 19 09/22/2019   AST 16 09/22/2019   ALKPHOS 60 09/22/2019   BILITOT 0.4 09/22/2019   The 10-year ASCVD risk score [name] DC Jr., et al., 2013) is: 2.7%   Values used to calculate the score:     Age: 64 years     Sex: Female     Is Non-Hispanic African American: No     Diabetic: No     Tobacco smoker: No     Systolic Blood Pressure: 96 mmHg     Is BP treated: No     HDL Cholesterol: 50 mg/dL     Total Cholesterol: 230 mg/dL  At risk for complication associated with hypotension. The patient is at a higher than average risk of hypotension due to consistent weight loss. [name] denies dizziness with position changes. She is not on anti-hypertensive medication.   Assessment/Plan:   Prediabetes. [name] will continue to work on weight loss, exercise, and decreasing simple carbohydrates to help decrease the risk of diabetes. She will decrease metFORMIN (GLUCOPHAGE) to 500 MG tablet BID with  0 refills. Labs will be checked today.   Vitamin D deficiency. Low Vitamin D level contributes to fatigue and are associated with obesity, breast, and colon cancer. She was given a refill on her Vitamin D, Ergocalciferol, (DRISDOL) 1.25 MG (50000 UNIT) CAPS capsule every week #4 with 0 refills and VITAMIN D 25 Hydroxy (Vit-D Deficiency, Fractures) level will be checked today.  Stage 3 chronic kidney disease, unspecified whether stage 3a or 3b CKD. [name] was advised to avoid NSAIDS. Labs will be checked today.   Other hyperlipidemia, mixed. Cardiovascular risk and specific lipid/LDL goals reviewed.  We discussed several lifestyle modifications today and  [name] will continue to work on diet, exercise and weight loss efforts. Orders and follow up as documented in patient record. Labs will be checked today.  Counseling Intensive lifestyle modifications are the first line treatment for this issue. . Dietary changes: Increase soluble fiber. Decrease simple carbohydrates. . Exercise changes: Moderate to vigorous-intensity aerobic activity 150 minutes per week if tolerated. . Lipid-lowering medications: see documented in medical record.  At risk for complication associated with hypotension. [name] was given approximately 15 minutes of education and counseling today to help avoid hypotension. We discussed risks of hypotension with weight loss and signs of hypotension such as feeling lightheaded or unsteady.  Repetitive spaced learning was employed today to elicit superior memory formation and behavioral change.  Class 1 obesity with serious comorbidity and body mass index (BMI) of 30.0 to 30.9 in adult, unspecified obesity type. Refill was given for Naltrexone-buPROPion HCl ER (CONTRAVE) 8-90 MG TB12 2 tabs daily #120 with 0 refills.  [name] is currently in the action stage of change. As such, her goal is to continue with weight loss efforts. She has agreed to keeping a food journal and adhering to recommended goals of 1200 calories and 85 grams protein daily.   Exercise goals: [name] will continue her current exercise regimen.   Behavioral modification strategies: increasing lean protein intake, increasing water intake, meal planning and cooking strategies, planning for success and keeping a strict food journal.  [name] has agreed to follow-up with our clinic in 2 weeks. She was informed of the importance of frequent follow-up visits to maximize her success with intensive lifestyle modifications for her multiple health conditions.   [name] was informed we would discuss her lab results at her next visit unless there is a critical issue that needs to be  addressed sooner. [name] agreed to keep her next visit at the agreed upon time to discuss these results.  Objective:   Blood pressure 96/67, pulse (!) 59, temperature (!) 97.5 F (36.4 C), temperature source Oral, height 5\' 6"  (1.676 m), weight 191 lb (86.6 kg), SpO2 98 %. Body mass index is 30.83 kg/m.  General: Cooperative, alert, well developed, in no acute distress. HEENT: Conjunctivae and lids unremarkable. Cardiovascular: Regular rhythm.  Lungs: Normal work of breathing. Neurologic: No focal deficits.   Lab Results  Component Value Date   CREATININE 1.02 (H) 09/22/2019   BUN 27 (H) 09/22/2019   NA 141 09/22/2019   K 4.4 09/22/2019   CL 106 09/22/2019   CO2 23 09/22/2019   Lab Results  Component Value Date   ALT 19 09/22/2019   AST 16 09/22/2019   ALKPHOS 60 09/22/2019   BILITOT 0.4 09/22/2019   Lab Results  Component Value Date   HGBA1C 5.3 05/31/2019   HGBA1C 5.2 11/19/2018   HGBA1C 5.5 04/29/2018   Lab Results  Component Value Date   INSULIN  16.5 05/31/2019   INSULIN 17.6 11/19/2018   INSULIN 25.8 (H) 04/29/2018   Lab Results  Component Value Date   TSH 0.564 04/29/2018   Lab Results  Component Value Date   CHOL 230 (H) 05/31/2019   HDL 50 05/31/2019   LDLCALC 142 (H) 05/31/2019   TRIG 211 (H) 05/31/2019   Lab Results  Component Value Date   WBC 2.6 (L) 09/22/2019   HGB 10.1 (L) 09/22/2019   HCT 29.3 (L) 09/22/2019   MCV 100.3 (H) 09/22/2019   PLT 86 (L) 09/22/2019   No results found for: IRON, TIBC, FERRITIN  Attestation Statements:   Reviewed by clinician on day of visit: allergies, medications, problem list, medical history, surgical history, family history, social history, and previous encounter notes.  I, Michaelene Song, am acting as Location manager for [employer], NP-C   I have reviewed the above documentation for accuracy and completeness, and I agree with the above. -  Esaw Grandchild, NP

## 2019-12-01 LAB — COMPREHENSIVE METABOLIC PANEL
ALT: 17 IU/L (ref 0–32)
AST: 16 IU/L (ref 0–40)
Albumin/Globulin Ratio: 1.6 (ref 1.2–2.2)
Albumin: 4.3 g/dL (ref 3.8–4.8)
Alkaline Phosphatase: 82 IU/L (ref 48–121)
BUN/Creatinine Ratio: 27 (ref 12–28)
BUN: 32 mg/dL — ABNORMAL HIGH (ref 8–27)
Bilirubin Total: <0.2 mg/dL (ref 0.0–1.2)
CO2: 23 mmol/L (ref 20–29)
Calcium: 10.1 mg/dL (ref 8.7–10.3)
Chloride: 98 mmol/L (ref 96–106)
Creatinine, Ser: 1.19 mg/dL — ABNORMAL HIGH (ref 0.57–1.00)
GFR calc Af Amer: 57 mL/min/{1.73_m2} — ABNORMAL LOW (ref >59)
GFR calc non Af Amer: 49 mL/min/{1.73_m2} — ABNORMAL LOW (ref >59)
Globulin, Total: 2.7 g/dL (ref 1.5–4.5)
Glucose: 100 mg/dL — ABNORMAL HIGH (ref 65–99)
Potassium: 4.8 mmol/L (ref 3.5–5.2)
Sodium: 139 mmol/L (ref 134–144)
Total Protein: 7 g/dL (ref 6.0–8.5)

## 2019-12-01 LAB — LIPID PANEL WITH LDL/HDL RATIO
Cholesterol, Total: 224 mg/dL — ABNORMAL HIGH (ref 100–199)
HDL: 52 mg/dL (ref >39)
LDL Chol Calc (NIH): 136 mg/dL — ABNORMAL HIGH (ref 0–99)
LDL/HDL Ratio: 2.6 ratio (ref 0.0–3.2)
Triglycerides: 201 mg/dL — ABNORMAL HIGH (ref 0–149)
VLDL Cholesterol Cal: 36 mg/dL (ref 5–40)

## 2019-12-01 LAB — INSULIN, RANDOM: INSULIN: 23.8 u[IU]/mL (ref 2.6–24.9)

## 2019-12-01 LAB — T4: T4, Total: 6.9 ug/dL (ref 4.5–12.0)

## 2019-12-01 LAB — HEMOGLOBIN A1C
Est. average glucose Bld gHb Est-mCnc: 103 mg/dL
Hgb A1c MFr Bld: 5.2 % (ref 4.8–5.6)

## 2019-12-01 LAB — VITAMIN B12: Vitamin B-12: 375 pg/mL (ref 232–1245)

## 2019-12-01 LAB — VITAMIN D 25 HYDROXY (VIT D DEFICIENCY, FRACTURES): Vit D, 25-Hydroxy: 59 ng/mL (ref 30.0–100.0)

## 2019-12-01 LAB — T3: T3, Total: 105 ng/dL (ref 71–180)

## 2019-12-01 LAB — TSH: TSH: 0.707 u[IU]/mL (ref 0.450–4.500)

## 2019-12-03 DIAGNOSIS — M1812 Unilateral primary osteoarthritis of first carpometacarpal joint, left hand: Secondary | ICD-10-CM | POA: Diagnosis not present

## 2019-12-03 DIAGNOSIS — M189 Osteoarthritis of first carpometacarpal joint, unspecified: Secondary | ICD-10-CM | POA: Diagnosis not present

## 2019-12-03 DIAGNOSIS — M79645 Pain in left finger(s): Secondary | ICD-10-CM | POA: Diagnosis not present

## 2019-12-14 ENCOUNTER — Other Ambulatory Visit: Payer: Self-pay

## 2019-12-14 ENCOUNTER — Encounter (INDEPENDENT_AMBULATORY_CARE_PROVIDER_SITE_OTHER): Payer: Self-pay | Admitting: Family Medicine

## 2019-12-14 ENCOUNTER — Ambulatory Visit (INDEPENDENT_AMBULATORY_CARE_PROVIDER_SITE_OTHER): Payer: 59 | Admitting: Family Medicine

## 2019-12-14 VITALS — BP 110/66 | HR 64 | Temp 97.6°F | Ht 66.0 in | Wt 194.0 lb

## 2019-12-14 DIAGNOSIS — E66811 Obesity, class 1: Secondary | ICD-10-CM

## 2019-12-14 DIAGNOSIS — E669 Obesity, unspecified: Secondary | ICD-10-CM

## 2019-12-14 DIAGNOSIS — Z6831 Body mass index (BMI) 31.0-31.9, adult: Secondary | ICD-10-CM

## 2019-12-14 DIAGNOSIS — N1831 Chronic kidney disease, stage 3a: Secondary | ICD-10-CM | POA: Diagnosis not present

## 2019-12-14 DIAGNOSIS — E559 Vitamin D deficiency, unspecified: Secondary | ICD-10-CM

## 2019-12-14 DIAGNOSIS — E7849 Other hyperlipidemia: Secondary | ICD-10-CM | POA: Diagnosis not present

## 2019-12-14 MED ORDER — VITAMIN D (ERGOCALCIFEROL) 1.25 MG (50000 UNIT) PO CAPS: 50000.0000 [IU] | ORAL_CAPSULE | ORAL | 0 refills | Status: DC

## 2019-12-14 NOTE — Progress Notes (Signed)
The 10-year ASCVD risk score Mikey Bussing DC Brooke Bonito., et al., 2013) is: 3.7%   Values used to calculate the score:     Age: 64 years     Sex: Female     Is Non-Hispanic African American: No     Diabetic: No     Tobacco smoker: No     Systolic Blood Pressure: 733 mmHg     Is BP treated: No     HDL Cholesterol: 52 mg/dL     Total Cholesterol: 224 mg/dL

## 2019-12-15 ENCOUNTER — Encounter (INDEPENDENT_AMBULATORY_CARE_PROVIDER_SITE_OTHER): Payer: Self-pay | Admitting: Family Medicine

## 2019-12-15 NOTE — Progress Notes (Signed)
Chief Complaint:   OBESITY [name] is here to discuss her progress with her obesity treatment plan along with follow-up of her obesity related diagnoses. [name] is on keeping a food journal and adhering to recommended goals of 1200 calories and 85 grams of protein daily and states she is following her eating plan approximately 80% of the time. [name] states she is walking for 30 minutes 2 times per week.  Today's visit was #: 66 Starting weight: 209 lbs Starting date: 04/29/2018 Today's weight: 194 lbs Today's date: 12/14/2019 Total lbs lost to date: 15 Total lbs lost since last in-office visit: 0  Interim History: [name] has been journaling about 50% of the time. She feels she may need more structure with her food plan. She is on Contrave 1 pill BID, and she notes she is not sure it is helping much with polyphagia.  Subjective:   1. Vitamin D deficiency [name]'s Vit D is at goal at 6. She has only increased by 5 ng/ml  on 6 months of weekly prescription Vit D.  2. Stage 3a chronic kidney disease [name]'s GFR is 49 (down from 59). She is aware of chronic kidney disease and she does avoid NSAIDS. I discussed labs with the patient today.  3. Other hyperlipidemia [name] has hyperlipidemia and has been trying to improve her cholesterol levels with intensive lifestyle modification including a low saturated fat diet, exercise and weight loss. [name]'s LDL is elevated at 136, and triglycerides are elevated at 201 but HDL is normal at 57. She is not on statin, and her 10 years ASCVD risk score is 3.7%. She notes a family history of high cholesterol.   Lab Results  Component Value Date   ALT 17 11/30/2019   AST 16 11/30/2019   ALKPHOS 82 11/30/2019   BILITOT <0.2 11/30/2019   Lab Results  Component Value Date   CHOL 224 (H) 11/30/2019   HDL 52 11/30/2019   LDLCALC 136 (H) 11/30/2019   TRIG 201 (H) 11/30/2019   Assessment/Plan:   1. Vitamin D deficiency Low Vitamin D level contributes  to fatigue and are associated with obesity, breast, and colon cancer. We will refill prescription Vitamin D for 1 month. [name] will follow-up for routine testing of Vitamin D, at least 2-3 times per year to avoid over-replacement.  - Vitamin D, Ergocalciferol, (DRISDOL) 1.25 MG (50000 UNIT) CAPS capsule; Take 1 capsule (50,000 Units total) by mouth every 7 (seven) days.  Dispense: 4 capsule; Refill: 0  2. Stage 3a chronic kidney disease [name] will continue to avoid NSAIDS, and she will increase her water intake.  3. Other hyperlipidemia Cardiovascular risk and specific lipid/LDL goals reviewed. We discussed several lifestyle modifications today. No statin indicated. [name] will continue her meal plan, and will continue to work on exercise and weight loss efforts.   4. Class 1 obesity with serious comorbidity and body mass index (BMI) of 31.0 to 31.9 in adult, unspecified obesity type [name] is currently in the action stage of change. As such, her goal is to continue with weight loss efforts. She has agreed to the Category 2 Plan.   [name] will switch to the Category 2 plan.   We discussed various medication options to help [name] with her weight loss efforts and we discussed Wegovy and she will consider this. [name] will continue Contrave 1 pill BID and she was advised to not increase dose due to chronic kidney disease. We discussed JKDTOI and she will consider this. It would  be a good fit for her as their is no renal dosage adjustment.   Exercise goals: As is.  Behavioral modification strategies: increasing lean protein intake and decreasing simple carbohydrates.  [name] has agreed to follow-up with our clinic in 2 to 3 weeks. She was informed of the importance of frequent follow-up visits to maximize her success with intensive lifestyle modifications for her multiple health conditions.   Objective:   Blood pressure 110/66, pulse 64, temperature 97.6 F (36.4 C), temperature source Oral, height  5\' 6"  (1.676 m), weight 194 lb (88 kg), SpO2 97 %. Body mass index is 31.31 kg/m.  General: Cooperative, alert, well developed, in no acute distress. HEENT: Conjunctivae and lids unremarkable. Cardiovascular: Regular rhythm.  Lungs: Normal work of breathing. Neurologic: No focal deficits.   Lab Results  Component Value Date   CREATININE 1.19 (H) 11/30/2019   BUN 32 (H) 11/30/2019   NA 139 11/30/2019   K 4.8 11/30/2019   CL 98 11/30/2019   CO2 23 11/30/2019   Lab Results  Component Value Date   ALT 17 11/30/2019   AST 16 11/30/2019   ALKPHOS 82 11/30/2019   BILITOT <0.2 11/30/2019   Lab Results  Component Value Date   HGBA1C 5.2 11/30/2019   HGBA1C 5.3 05/31/2019   HGBA1C 5.2 11/19/2018   HGBA1C 5.5 04/29/2018   Lab Results  Component Value Date   INSULIN 23.8 11/30/2019   INSULIN 16.5 05/31/2019   INSULIN 17.6 11/19/2018   INSULIN 25.8 (H) 04/29/2018   Lab Results  Component Value Date   TSH 0.707 11/30/2019   Lab Results  Component Value Date   CHOL 224 (H) 11/30/2019   HDL 52 11/30/2019   LDLCALC 136 (H) 11/30/2019   TRIG 201 (H) 11/30/2019   Lab Results  Component Value Date   WBC 2.6 (L) 09/22/2019   HGB 10.1 (L) 09/22/2019   HCT 29.3 (L) 09/22/2019   MCV 100.3 (H) 09/22/2019   PLT 86 (L) 09/22/2019   No results found for: IRON, TIBC, FERRITIN  Attestation Statements:   Reviewed by clinician on day of visit: allergies, medications, problem list, medical history, surgical history, family history, social history, and previous encounter notes.   Wilhemena Durie, am acting as Location manager for [employer], FNP-C.  I have reviewed the above documentation for accuracy and completeness, and I agree with the above. -  Georgianne Fick, FNP

## 2019-12-21 ENCOUNTER — Inpatient Hospital Stay: Payer: 59 | Attending: Hematology

## 2019-12-21 ENCOUNTER — Other Ambulatory Visit: Payer: Self-pay | Admitting: *Deleted

## 2019-12-21 ENCOUNTER — Other Ambulatory Visit: Payer: Self-pay

## 2019-12-21 DIAGNOSIS — C569 Malignant neoplasm of unspecified ovary: Secondary | ICD-10-CM

## 2019-12-21 DIAGNOSIS — Z7189 Other specified counseling: Secondary | ICD-10-CM

## 2019-12-21 LAB — CBC WITH DIFFERENTIAL (CANCER CENTER ONLY)
Abs Immature Granulocytes: 0 10*3/uL (ref 0.00–0.07)
Basophils Absolute: 0 10*3/uL (ref 0.0–0.1)
Basophils Relative: 1 %
Eosinophils Absolute: 0.3 10*3/uL (ref 0.0–0.5)
Eosinophils Relative: 5 %
HCT: 40.3 % (ref 36.0–46.0)
Hemoglobin: 13.7 g/dL (ref 12.0–15.0)
Immature Granulocytes: 0 %
Lymphocytes Relative: 40 %
Lymphs Abs: 2 10*3/uL (ref 0.7–4.0)
MCH: 32.5 pg (ref 26.0–34.0)
MCHC: 34 g/dL (ref 30.0–36.0)
MCV: 95.5 fL (ref 80.0–100.0)
Monocytes Absolute: 0.5 10*3/uL (ref 0.1–1.0)
Monocytes Relative: 9 %
Neutro Abs: 2.3 10*3/uL (ref 1.7–7.7)
Neutrophils Relative %: 45 %
Platelet Count: 254 10*3/uL (ref 150–400)
RBC: 4.22 MIL/uL (ref 3.87–5.11)
RDW: 11.5 % (ref 11.5–15.5)
WBC Count: 5.1 10*3/uL (ref 4.0–10.5)
nRBC: 0 % (ref 0.0–0.2)

## 2019-12-21 LAB — CMP (CANCER CENTER ONLY)
ALT: 18 U/L (ref 0–44)
AST: 18 U/L (ref 15–41)
Albumin: 3.8 g/dL (ref 3.5–5.0)
Alkaline Phosphatase: 74 U/L (ref 38–126)
Anion gap: 7 (ref 5–15)
BUN: 29 mg/dL — ABNORMAL HIGH (ref 8–23)
CO2: 28 mmol/L (ref 22–32)
Calcium: 9.8 mg/dL (ref 8.9–10.3)
Chloride: 102 mmol/L (ref 98–111)
Creatinine: 1.18 mg/dL — ABNORMAL HIGH (ref 0.44–1.00)
GFR, Est AFR Am: 57 mL/min — ABNORMAL LOW (ref >60)
GFR, Estimated: 49 mL/min — ABNORMAL LOW (ref >60)
Glucose, Bld: 103 mg/dL — ABNORMAL HIGH (ref 70–99)
Potassium: 4.3 mmol/L (ref 3.5–5.1)
Sodium: 137 mmol/L (ref 135–145)
Total Bilirubin: 0.3 mg/dL (ref 0.3–1.2)
Total Protein: 7 g/dL (ref 6.5–8.1)

## 2019-12-22 ENCOUNTER — Ambulatory Visit (HOSPITAL_COMMUNITY)
Admission: RE | Admit: 2019-12-22 | Discharge: 2019-12-22 | Disposition: A | Discharge status: Home / Self Care | Payer: 59 | Source: Ambulatory Visit | Attending: Hematology and Oncology | Admitting: Hematology and Oncology

## 2019-12-22 ENCOUNTER — Encounter (HOSPITAL_COMMUNITY): Payer: Self-pay

## 2019-12-22 ENCOUNTER — Inpatient Hospital Stay: Payer: 59

## 2019-12-22 DIAGNOSIS — R1907 Generalized intra-abdominal and pelvic swelling, mass and lump: Secondary | ICD-10-CM | POA: Diagnosis not present

## 2019-12-22 DIAGNOSIS — R918 Other nonspecific abnormal finding of lung field: Secondary | ICD-10-CM | POA: Diagnosis not present

## 2019-12-22 DIAGNOSIS — C569 Malignant neoplasm of unspecified ovary: Secondary | ICD-10-CM | POA: Diagnosis not present

## 2019-12-22 MED ORDER — IOHEXOL 9 MG/ML PO SOLN
ORAL | Status: AC
  Filled 2019-12-22: qty 1000

## 2019-12-22 MED ORDER — IOHEXOL 300 MG/ML  SOLN
100.0000 mL | Freq: Once | INTRAMUSCULAR | Status: AC | PRN
  Administered 2019-12-22: 100 mL via INTRAVENOUS

## 2019-12-22 MED ORDER — IOHEXOL 9 MG/ML PO SOLN
500.0000 mL | ORAL | Status: AC
  Administered 2019-12-22: 500 mL via ORAL

## 2019-12-23 ENCOUNTER — Inpatient Hospital Stay: Payer: 59 | Attending: Hematology | Admitting: Hematology and Oncology

## 2019-12-23 ENCOUNTER — Other Ambulatory Visit (HOSPITAL_COMMUNITY): Payer: 59

## 2019-12-23 ENCOUNTER — Other Ambulatory Visit: Payer: Self-pay

## 2019-12-23 ENCOUNTER — Encounter: Payer: Self-pay | Admitting: Hematology and Oncology

## 2019-12-23 DIAGNOSIS — Z5112 Encounter for antineoplastic immunotherapy: Secondary | ICD-10-CM | POA: Insufficient documentation

## 2019-12-23 DIAGNOSIS — I878 Other specified disorders of veins: Secondary | ICD-10-CM | POA: Diagnosis not present

## 2019-12-23 DIAGNOSIS — N183 Chronic kidney disease, stage 3 unspecified: Secondary | ICD-10-CM | POA: Insufficient documentation

## 2019-12-23 DIAGNOSIS — Z7189 Other specified counseling: Secondary | ICD-10-CM | POA: Diagnosis not present

## 2019-12-23 DIAGNOSIS — C569 Malignant neoplasm of unspecified ovary: Secondary | ICD-10-CM | POA: Diagnosis not present

## 2019-12-23 LAB — INHIBIN B: Inhibin B: 94.9 pg/mL — ABNORMAL HIGH (ref 0.0–16.9)

## 2019-12-23 NOTE — Assessment & Plan Note (Signed)
We have multiple goals of care discussions in the past She is aware that treatment goal is palliative

## 2019-12-23 NOTE — Progress Notes (Signed)
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[place]) I have reviewed multiple CT imaging with the patient She has significant change in the pelvic mass on the left inguinal region and she has intermittent pain We discussed the risk, benefits, side effects of bevacizumab and she is in agreement to proceed Due to the busy schedule, we have plan for her to start treatment in 2 weeks I will see her back prior to cycle 2 for toxicity review She is instructed to check her blood pressure closely while on bevacizumab I recommend minimum 4 doses before repeat CT imaging  CKD (chronic kidney disease), stage III We will monitor her creatinine carefully Uncontrolled hypertension can cause renal function abnormalities I will call her within a week after treatment to check on her and see how her blood pressure is doing  Poor venous access We discussed the risk and benefits of port placement She declined port placement  Goals of care, counseling/discussion We have multiple goals of care discussions in the past She is aware that treatment goal is palliative   No orders of the defined types were placed in this encounter.   All questions were answered. The patient knows to call the clinic with any problems, questions or concerns. The total time spent in the appointment was 30 minutes encounter with patients including review of chart and various tests results, discussions about plan of care and coordination of care plan   Heath Lark, MD 12/23/2019 2:17 PM  INTERVAL HISTORY: Please see below for problem oriented charting. She returns for further follow-up She feels well She has intermittent mild left inguinal area discomfort Denies abdominal pain, nausea or changes in bowel habits  SUMMARY OF ONCOLOGIC HISTORY: Oncology History Overview Note  2018 - Core  biopsy for ER/PR and Foundation One testing performed. Unfortunately, no sufficient tissue for Foundation One testing. ER was 50% and PR 90%. 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     - 09/2000 Chemotherapy   6 cycles of IP cisplatin and etoposide    02/2009 Relapse/Recurrence   CT showed increased size of nodules in pelvis   2010 Surgery   Exlap with section of tumor nodules near cecum and left pelvic sidewall. Tumor: ER negative, PR positive    Treatment Plan Change   Alternated 2 week courses of Megace and Tamoxifen - ended 03/2012   03/2012 PET scan   CT - progressive disease   2013 Treatment Plan Change   Letrozole   01/2016 Imaging   MRI showed progressive disease   2017 Treatment Plan Change   Two weeks of alternating Tamoxifen 20mg  daily and then Megace 40mg  TID   08/2016 Imaging   MRI showed progressive disease with peritoneal implants near liver, in pelvis   01/2017 Treatment Plan Change   Lupron 11.25 q 3 months   11/2017  Imaging   Overall mixed response.   Mixed cystic/solid lesions in the left pelvis are mildly improved.   Cystic peritoneal disease, including the dominant lesion along the posterior right hepatic lobe, is mildly progressed.   Subcapsular lesion along the posterior right hepatic lobe is unchanged.   03/2018 Imaging   MRI A/P: Mixed  response of individual peritoneal metastases in the pelvis, as described above. Overall, there has been no significant change in bulk of disease.   Stable cystic peritoneal metastatic disease along the capsular surfaces of the liver and spleen.   No new sites of metastatic disease identified within the abdomen or pelvis.   08/2018 Imaging   MRI A/P: Status post hysterectomy and bilateral salpingo-oophorectomy.   Mixed cystic/solid peritoneal implants in the abdomen/pelvis, as above. Dominant cystic implant along the posterior liver surface is mildly increased. Remaining lesions are overall grossly unchanged.   No new lesions are identified.   01/28/2019 Imaging   Mri A/P: 1. Relatively similar appearance of peritoneal metastasis. A posterior right hepatic capsular based lesion is similar to minimally decreased in size. Left pelvic implants are primarily similar with possible enlargement of an anterior high left pelvic cystic implant. No new disease identified. 2.  Aortic Atherosclerosis (ICD10-I70.0). 3. Hepatic steatosis. 4. Left adrenal adenoma.   06/02/2019 Imaging   MRI 1. Potential slight enlargement of dominant cystic area and solid component, associated with rind like signal variation on T2 along the inferior right hepatic margin, also potentially slightly increased. Findings may still be within the realm of measurement and technical variation. Close attention on follow-up. 2. Subtle cystic changes along the cephalad margin of the spleen are difficult to see on previous imaging, perhaps new compared with prior imaging studies. 3. Pelvic implants and left lower quadrant lesion with similar size, of the area along the left iliac vasculature may be slightly larger than on the prior study. 4. Signs of extensive retroperitoneal and pelvic lymphadenectomy. 5. Hepatic steatosis. 6. 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     06/08/2019  Cancer Staging   Staging form: [place], AJCC 7th Edition - Clinical: Stage IIIC (rT2, N1, M0) - Signed by Heath Lark, MD on 06/08/2019   06/10/2019 Imaging   1. 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 These findings are slightly worsened in comparison to a remote prior CT examination dated 10/04/2014.   2.  No evidence of metastatic disease in the chest.   3. Status post hysterectomy, pelvic and retroperitoneal lymph node dissection, and ventral hernia mesh repair.   4.  Hepatic steatosis.   5.  Aortic Atherosclerosis (ICD10-I70.0).   06/24/2019 - 09/02/2019 Chemotherapy   The patient had carboplatin for chemotherapy treatment.     09/02/2019 Tumor Marker   Patient's tumor was tested for the following markers: Inhibin B Results of the tumor marker test revealed 94   09/23/2019 Imaging   1. Interval progression of the soft tissue lesions in the anterior left pelvis, likely peritoneal implants, compatible with disease progression. Remaining sites of apparent disease along the liver capsule and posterior spleen are stable 2. Stable 2 cm left adrenal adenoma. 3. Hepatic steatosis. 4. Right-side predominant colonic diverticulosis without diverticulitis. 5. Aortic Atherosclerosis (ICD10-I70.0).   12/23/2019 Imaging   1. Slight interval increase in size of a mixed  solid and cystic nodule in the left hemipelvis measuring 3.0 x 2.9 cm, previously 2.7 x 2.1 cm. 2. Interval decrease in size of a nodule in the left paracolic gutter measuring 1.0 x 0.8 cm, previously 2.1 x 1.6 cm. 3. Stable peritoneal nodules in the hepatorenal recess and at the inferior tip of the spleen. 4. Unchanged nodule or lymph node overlying the left external iliac artery. 5. Enlargement of dominant left pelvic nodule is concerning for disease  progression despite interval decrease in size of a nodule in the left paracolic gutter and stability of other nodules. 6. No evidence of metastatic disease in the chest. 7. Stable, benign left adrenal adenoma. 8. Ventral hernia mesh repair with a small component of recurrent hernia inferiorly, containing a single nonobstructed loop of small bowel. 9. Hepatic steatosis. 10. Aortic Atherosclerosis (ICD10-I70.0).     REVIEW OF SYSTEMS:   Constitutional: Denies fevers, chills or abnormal weight loss Eyes: Denies blurriness of vision Ears, nose, mouth, throat, and face: Denies mucositis or sore throat Respiratory: Denies cough, dyspnea or wheezes Cardiovascular: Denies palpitation, chest discomfort or lower extremity swelling Gastrointestinal:  Denies nausea, heartburn or change in bowel habits Skin: Denies abnormal skin rashes Lymphatics: Denies new lymphadenopathy or easy bruising Neurological:Denies numbness, tingling or new weaknesses Behavioral/Psych: Mood is stable, no new changes  All other systems were reviewed with the patient and are negative.  I have reviewed the past medical history, past surgical history, social history and family history with the patient and they are unchanged from previous note.  ALLERGIES:  is allergic to codeine, oxycodone, sodium thiosalicylate, ciprofloxacin, codone [hydrocodone bitartrate], daypro [oxaprozin], levofloxacin, stadol [butorphanol tartrate], and sulfa antibiotics.  MEDICATIONS:  Current Outpatient Medications  Medication Sig Dispense Refill  . Biotin 10 MG CAPS Take by mouth.    . Calcium Citrate 200 MG TABS Take by mouth.    . escitalopram (LEXAPRO) 20 MG tablet Take 10 mg by mouth daily.     . metFORMIN (GLUCOPHAGE) 1000 MG tablet 1/2 tab BID with meals 60 tablet 0  . mirabegron ER (MYRBETRIQ) 25 MG TB24 tablet Take 25 mg by mouth daily.    . Multiple Vitamin (MULTIVITAMIN) capsule Take 1 capsule by mouth daily.    .  Naltrexone-buPROPion HCl ER (CONTRAVE) 8-90 MG TB12 Take 2 tablets by mouth daily. 120 tablet 0  . naproxen (NAPROSYN) 500 MG tablet Take 500 mg by mouth 2 (two) times daily with a meal.    . omeprazole (PRILOSEC) 20 MG capsule Take 20 mg by mouth daily.     . ondansetron (ZOFRAN) 8 MG tablet Take 1 tablet (8 mg total) by mouth every 8 (eight) hours as needed for refractory nausea / vomiting. Start on day 3 after carboplatin chemo. 30 tablet 1  . Probiotic Product (ALIGN PO) Take 1 tablet by mouth daily.     . prochlorperazine (COMPAZINE) 10 MG tablet Take 1 tablet (10 mg total) by mouth every 6 (six) hours as needed (Nausea or vomiting). 30 tablet 1  . Vitamin D, Ergocalciferol, (DRISDOL) 1.25 MG (50000 UNIT) CAPS capsule Take 1 capsule (50,000 Units total) by mouth every 7 (seven) days. 4 capsule 0   No current facility-administered medications for this visit.    PHYSICAL EXAMINATION: ECOG PERFORMANCE STATUS: 1 - Symptomatic but completely ambulatory  Vitals:   12/23/19 0921  BP: (!) 133/58  Pulse: (!) 53  Resp: 18  Temp: (!) 97.3 F (36.3 C)  SpO2: 100%   Filed Weights  12/23/19 0921  Weight: 196 lb 3.2 oz (89 kg)    GENERAL:alert, no distress and comfortable NEURO: alert & oriented x 3 with fluent speech, no focal motor/sensory deficits  LABORATORY DATA:  I have reviewed the data as listed    Component Value Date/Time   NA 137 12/21/2019 1259   NA 139 11/30/2019 0913   NA 141 09/24/2013 1131   K 4.3 12/21/2019 1259   K 4.5 09/24/2013 1131   CL 102 12/21/2019 1259   CO2 28 12/21/2019 1259   CO2 24 09/24/2013 1131   GLUCOSE 103 (H) 12/21/2019 1259   GLUCOSE 90 09/24/2013 1131   BUN 29 (H) 12/21/2019 1259   BUN 32 (H) 11/30/2019 0913   BUN 21.2 09/24/2013 1131   CREATININE 1.18 (H) 12/21/2019 1259   CREATININE 1.1 09/24/2013 1131   CALCIUM 9.8 12/21/2019 1259   CALCIUM 9.5 09/24/2013 1131   PROT 7.0 12/21/2019 1259   PROT 7.0 11/30/2019 0913   PROT 6.8  09/24/2013 1131   ALBUMIN 3.8 12/21/2019 1259   ALBUMIN 4.3 11/30/2019 0913   ALBUMIN 3.9 09/24/2013 1131   AST 18 12/21/2019 1259   AST 23 09/24/2013 1131   ALT 18 12/21/2019 1259   ALT 27 09/24/2013 1131   ALKPHOS 74 12/21/2019 1259   ALKPHOS 81 09/24/2013 1131   BILITOT 0.3 12/21/2019 1259   BILITOT 0.53 09/24/2013 1131   GFRNONAA 49 (L) 12/21/2019 1259   GFRAA 57 (L) 12/21/2019 1259    No results found for: SPEP, UPEP  Lab Results  Component Value Date   WBC 5.1 12/21/2019   NEUTROABS 2.3 12/21/2019   HGB 13.7 12/21/2019   HCT 40.3 12/21/2019   MCV 95.5 12/21/2019   PLT 254 12/21/2019      Chemistry      Component Value Date/Time   NA 137 12/21/2019 1259   NA 139 11/30/2019 0913   NA 141 09/24/2013 1131   K 4.3 12/21/2019 1259   K 4.5 09/24/2013 1131   CL 102 12/21/2019 1259   CO2 28 12/21/2019 1259   CO2 24 09/24/2013 1131   BUN 29 (H) 12/21/2019 1259   BUN 32 (H) 11/30/2019 0913   BUN 21.2 09/24/2013 1131   CREATININE 1.18 (H) 12/21/2019 1259   CREATININE 1.1 09/24/2013 1131      Component Value Date/Time   CALCIUM 9.8 12/21/2019 1259   CALCIUM 9.5 09/24/2013 1131   ALKPHOS 74 12/21/2019 1259   ALKPHOS 81 09/24/2013 1131   AST 18 12/21/2019 1259   AST 23 09/24/2013 1131   ALT 18 12/21/2019 1259   ALT 27 09/24/2013 1131   BILITOT 0.3 12/21/2019 1259   BILITOT 0.53 09/24/2013 1131       RADIOGRAPHIC STUDIES: I have reviewed multiple CT imaging with the patient I have personally reviewed the radiological images as listed and agreed with the findings in the report. CT CHEST W CONTRAST  Result Date: 12/23/2019 CLINICAL DATA:  Ovarian cancer surveillance, assess for disease progression EXAM: CT CHEST, ABDOMEN, AND PELVIS WITH CONTRAST TECHNIQUE: Multidetector CT imaging of the chest, abdomen and pelvis was performed following the standard protocol during bolus administration of intravenous contrast. CONTRAST:  130mL OMNIPAQUE IOHEXOL 300 MG/ML SOLN,  additional oral enteric contrast COMPARISON:  09/23/2019, 06/09/2019 FINDINGS: CT CHEST FINDINGS Cardiovascular: Aortic atherosclerosis. Normal heart size. No pericardial effusion. Mediastinum/Nodes: No enlarged mediastinal, hilar, or axillary lymph nodes. Thyroid gland, trachea, and esophagus demonstrate no significant findings. Lungs/Pleura: Lungs are clear. No pleural effusion or  pneumothorax. Musculoskeletal: No chest wall mass or suspicious bone lesions identified. CT ABDOMEN PELVIS FINDINGS Hepatobiliary: No solid liver abnormality is seen. Hepatic steatosis. No gallstones, gallbladder wall thickening, or biliary dilatation. Pancreas: Unremarkable. No pancreatic ductal dilatation or surrounding inflammatory changes. Spleen: Normal in size without significant abnormality. Adrenals/Urinary Tract: Stable, benign 2.0 cm left adrenal adenoma (series 2, image 56). Kidneys are normal, without renal calculi, solid lesion, or hydronephrosis. Bladder is unremarkable. Stomach/Bowel: Stomach is within normal limits. No evidence of bowel wall thickening, distention, or inflammatory changes. Cecal diverticulosis. Large burden of stool and stool balls in the distal colon and rectum. Vascular/Lymphatic: Aortic atherosclerosis. No definite enlarged abdominal or pelvic lymph nodes. Reproductive: Status post hysterectomy and oophorectomy. Slight interval increase in size of a mixed solid and cystic nodule in the left hemipelvis measuring 3.0 x 2.9 cm, previously 2.7 x 2.1 cm (series 2, image 110). Other: Ventral hernia mesh repair with a small component of recurrent hernia inferiorly, containing a single nonobstructed loop of small bowel (series 2, image 107). No abdominopelvic ascites. Stable peritoneal nodules in the hepatorenal recess (series 2, image 58) and at the inferior tip of the spleen (series 2, image 57). Unchanged nodule or lymph node overlying the left external iliac artery (series 2, image 97). Interval decrease  in size of a nodule in the left paracolic gutter measuring 1.0 x 0.8 cm, previously 2.1 x 1.6 cm (series 2, image 99). Musculoskeletal: No acute or significant osseous findings. IMPRESSION: 1. Slight interval increase in size of a mixed solid and cystic nodule in the left hemipelvis measuring 3.0 x 2.9 cm, previously 2.7 x 2.1 cm. 2. Interval decrease in size of a nodule in the left paracolic gutter measuring 1.0 x 0.8 cm, previously 2.1 x 1.6 cm. 3. Stable peritoneal nodules in the hepatorenal recess and at the inferior tip of the spleen. 4. Unchanged nodule or lymph node overlying the left external iliac artery. 5. Enlargement of dominant left pelvic nodule is concerning for disease progression despite interval decrease in size of a nodule in the left paracolic gutter and stability of other nodules. 6. No evidence of metastatic disease in the chest. 7. Stable, benign left adrenal adenoma. 8. Ventral hernia mesh repair with a small component of recurrent hernia inferiorly, containing a single nonobstructed loop of small bowel. 9. Hepatic steatosis. 10. Aortic Atherosclerosis (ICD10-I70.0). Electronically Signed   By: Eddie Candle M.D.   On: 12/23/2019 08:45   CT ABDOMEN PELVIS W CONTRAST  Result Date: 12/23/2019 CLINICAL DATA:  Ovarian cancer surveillance, assess for disease progression EXAM: CT CHEST, ABDOMEN, AND PELVIS WITH CONTRAST TECHNIQUE: Multidetector CT imaging of the chest, abdomen and pelvis was performed following the standard protocol during bolus administration of intravenous contrast. CONTRAST:  158mL OMNIPAQUE IOHEXOL 300 MG/ML SOLN, additional oral enteric contrast COMPARISON:  09/23/2019, 06/09/2019 FINDINGS: CT CHEST FINDINGS Cardiovascular: Aortic atherosclerosis. Normal heart size. No pericardial effusion. Mediastinum/Nodes: No enlarged mediastinal, hilar, or axillary lymph nodes. Thyroid gland, trachea, and esophagus demonstrate no significant findings. Lungs/Pleura: Lungs are clear. No  pleural effusion or pneumothorax. Musculoskeletal: No chest wall mass or suspicious bone lesions identified. CT ABDOMEN PELVIS FINDINGS Hepatobiliary: No solid liver abnormality is seen. Hepatic steatosis. No gallstones, gallbladder wall thickening, or biliary dilatation. Pancreas: Unremarkable. No pancreatic ductal dilatation or surrounding inflammatory changes. Spleen: Normal in size without significant abnormality. Adrenals/Urinary Tract: Stable, benign 2.0 cm left adrenal adenoma (series 2, image 56). Kidneys are normal, without renal calculi, solid lesion, or hydronephrosis. Bladder is  unremarkable. Stomach/Bowel: Stomach is within normal limits. No evidence of bowel wall thickening, distention, or inflammatory changes. Cecal diverticulosis. Large burden of stool and stool balls in the distal colon and rectum. Vascular/Lymphatic: Aortic atherosclerosis. No definite enlarged abdominal or pelvic lymph nodes. Reproductive: Status post hysterectomy and oophorectomy. Slight interval increase in size of a mixed solid and cystic nodule in the left hemipelvis measuring 3.0 x 2.9 cm, previously 2.7 x 2.1 cm (series 2, image 110). Other: Ventral hernia mesh repair with a small component of recurrent hernia inferiorly, containing a single nonobstructed loop of small bowel (series 2, image 107). No abdominopelvic ascites. Stable peritoneal nodules in the hepatorenal recess (series 2, image 58) and at the inferior tip of the spleen (series 2, image 57). Unchanged nodule or lymph node overlying the left external iliac artery (series 2, image 97). Interval decrease in size of a nodule in the left paracolic gutter measuring 1.0 x 0.8 cm, previously 2.1 x 1.6 cm (series 2, image 99). Musculoskeletal: No acute or significant osseous findings. IMPRESSION: 1. Slight interval increase in size of a mixed solid and cystic nodule in the left hemipelvis measuring 3.0 x 2.9 cm, previously 2.7 x 2.1 cm. 2. Interval decrease in size of a  nodule in the left paracolic gutter measuring 1.0 x 0.8 cm, previously 2.1 x 1.6 cm. 3. Stable peritoneal nodules in the hepatorenal recess and at the inferior tip of the spleen. 4. Unchanged nodule or lymph node overlying the left external iliac artery. 5. Enlargement of dominant left pelvic nodule is concerning for disease progression despite interval decrease in size of a nodule in the left paracolic gutter and stability of other nodules. 6. No evidence of metastatic disease in the chest. 7. Stable, benign left adrenal adenoma. 8. Ventral hernia mesh repair with a small component of recurrent hernia inferiorly, containing a single nonobstructed loop of small bowel. 9. Hepatic steatosis. 10. Aortic Atherosclerosis (ICD10-I70.0). Electronically Signed   By: Eddie Candle M.D.   On: 12/23/2019 08:45

## 2019-12-23 NOTE — Assessment & Plan Note (Signed)
We will monitor her creatinine carefully Uncontrolled hypertension can cause renal function abnormalities I will call her within a week after treatment to check on her and see how her blood pressure is doing

## 2019-12-23 NOTE — Assessment & Plan Note (Signed)
I have reviewed multiple CT imaging with the patient She has significant change in the pelvic mass on the left inguinal region and she has intermittent pain We discussed the risk, benefits, side effects of bevacizumab and she is in agreement to proceed Due to the busy schedule, we have plan for her to start treatment in 2 weeks I will see her back prior to cycle 2 for toxicity review She is instructed to check her blood pressure closely while on bevacizumab I recommend minimum 4 doses before repeat CT imaging

## 2019-12-23 NOTE — Assessment & Plan Note (Signed)
We discussed the risk and benefits of port placement She declined port placement

## 2019-12-29 MED FILL — OMEPRAZOLE 20 MG CAP: 20 | 90 days supply | Qty: 90 | Fill #1

## 2019-12-31 ENCOUNTER — Other Ambulatory Visit: Payer: Self-pay | Admitting: Hematology and Oncology

## 2019-12-31 NOTE — Progress Notes (Signed)
Pharmacist Chemotherapy Monitoring - Initial Assessment    Anticipated start date: 01/06/2020   Regimen:  . Are orders appropriate based on the patient's diagnosis, regimen, and cycle? Yes . Does the plan date match the patient's scheduled date? Yes . Is the sequencing of drugs appropriate? Yes . Are the premedications appropriate for the patient's regimen? Yes . Prior Authorization for treatment is: Pending o If applicable, is the correct biosimilar selected based on the patient's insurance? yes  Organ Function and Labs: Marland Kitchen Are dose adjustments needed based on the patient's renal function, hepatic function, or hematologic function? Yes . Are appropriate labs ordered prior to the start of patient's treatment? Yes . Other organ system assessment, if indicated: bevacizumab: baseline BP . The following baseline labs, if indicated, have been ordered: bevacizumab: urine protein  Dose Assessment: . Are the drug doses appropriate? Yes . Are the following correct: o Drug concentrations Yes o IV fluid compatible with drug Yes o Administration routes Yes o Timing of therapy Yes . If applicable, does the patient have documented access for treatment and/or plans for port-a-cath placement? no . If applicable, have lifetime cumulative doses been properly documented and assessed? not applicable Lifetime Dose Tracking  . Carboplatin: 1,960 mg = 0.01 % of the maximum lifetime dose of 999,999,999 mg  o   Toxicity Monitoring/Prevention: . The patient has the following take home antiemetics prescribed: Ondansetron and Prochlorperazine . The patient has the following take home medications prescribed: N/A . Medication allergies and previous infusion related reactions, if applicable, have been reviewed and addressed. No . The patient's current medication list has been assessed for drug-drug interactions with their chemotherapy regimen. no significant drug-drug interactions were identified on  review.  Order Review: . Are the treatment plan orders signed? Yes . Is the patient scheduled to see a provider prior to their treatment? No  I verify that I have reviewed each item in the above checklist and answered each question accordingly.  Tushar Enns D 12/31/2019 8:14 AM

## 2020-01-05 ENCOUNTER — Other Ambulatory Visit: Payer: Self-pay

## 2020-01-05 ENCOUNTER — Inpatient Hospital Stay: Payer: 59

## 2020-01-05 DIAGNOSIS — N183 Chronic kidney disease, stage 3 unspecified: Secondary | ICD-10-CM | POA: Diagnosis not present

## 2020-01-05 DIAGNOSIS — Z7189 Other specified counseling: Secondary | ICD-10-CM

## 2020-01-05 DIAGNOSIS — C569 Malignant neoplasm of unspecified ovary: Secondary | ICD-10-CM

## 2020-01-05 DIAGNOSIS — Z5112 Encounter for antineoplastic immunotherapy: Secondary | ICD-10-CM | POA: Diagnosis not present

## 2020-01-05 LAB — COMPREHENSIVE METABOLIC PANEL
ALT: 19 U/L (ref 0–44)
AST: 21 U/L (ref 15–41)
Albumin: 3.9 g/dL (ref 3.5–5.0)
Alkaline Phosphatase: 74 U/L (ref 38–126)
Anion gap: 8 (ref 5–15)
BUN: 27 mg/dL — ABNORMAL HIGH (ref 8–23)
CO2: 28 mmol/L (ref 22–32)
Calcium: 9.5 mg/dL (ref 8.9–10.3)
Chloride: 102 mmol/L (ref 98–111)
Creatinine, Ser: 1.2 mg/dL — ABNORMAL HIGH (ref 0.44–1.00)
GFR calc Af Amer: 56 mL/min — ABNORMAL LOW (ref >60)
GFR calc non Af Amer: 48 mL/min — ABNORMAL LOW (ref >60)
Glucose, Bld: 92 mg/dL (ref 70–99)
Potassium: 4.5 mmol/L (ref 3.5–5.1)
Sodium: 138 mmol/L (ref 135–145)
Total Bilirubin: 0.3 mg/dL (ref 0.3–1.2)
Total Protein: 7.3 g/dL (ref 6.5–8.1)

## 2020-01-05 LAB — CBC WITH DIFFERENTIAL (CANCER CENTER ONLY)
Abs Immature Granulocytes: 0.01 10*3/uL (ref 0.00–0.07)
Basophils Absolute: 0 10*3/uL (ref 0.0–0.1)
Basophils Relative: 1 %
Eosinophils Absolute: 0.4 10*3/uL (ref 0.0–0.5)
Eosinophils Relative: 7 %
HCT: 41.5 % (ref 36.0–46.0)
Hemoglobin: 14 g/dL (ref 12.0–15.0)
Immature Granulocytes: 0 %
Lymphocytes Relative: 32 %
Lymphs Abs: 1.8 10*3/uL (ref 0.7–4.0)
MCH: 31.8 pg (ref 26.0–34.0)
MCHC: 33.7 g/dL (ref 30.0–36.0)
MCV: 94.3 fL (ref 80.0–100.0)
Monocytes Absolute: 0.5 10*3/uL (ref 0.1–1.0)
Monocytes Relative: 9 %
Neutro Abs: 2.9 10*3/uL (ref 1.7–7.7)
Neutrophils Relative %: 51 %
Platelet Count: 263 10*3/uL (ref 150–400)
RBC: 4.4 MIL/uL (ref 3.87–5.11)
RDW: 11.5 % (ref 11.5–15.5)
WBC Count: 5.6 10*3/uL (ref 4.0–10.5)
nRBC: 0 % (ref 0.0–0.2)

## 2020-01-05 LAB — TOTAL PROTEIN, URINE DIPSTICK: Protein, ur: NEGATIVE mg/dL

## 2020-01-06 ENCOUNTER — Inpatient Hospital Stay: Payer: 59

## 2020-01-06 VITALS — BP 142/67 | HR 49 | Temp 98.0°F | Resp 16

## 2020-01-06 DIAGNOSIS — N183 Chronic kidney disease, stage 3 unspecified: Secondary | ICD-10-CM | POA: Diagnosis not present

## 2020-01-06 DIAGNOSIS — C569 Malignant neoplasm of unspecified ovary: Secondary | ICD-10-CM

## 2020-01-06 DIAGNOSIS — Z7189 Other specified counseling: Secondary | ICD-10-CM

## 2020-01-06 DIAGNOSIS — Z5112 Encounter for antineoplastic immunotherapy: Secondary | ICD-10-CM | POA: Diagnosis not present

## 2020-01-06 MED ORDER — SODIUM CHLORIDE 0.9 % IV SOLN
15.0000 mg/kg | INTRAVENOUS | Status: DC
  Administered 2020-01-06: 1400 mg via INTRAVENOUS
  Filled 2020-01-06: qty 48

## 2020-01-06 MED ORDER — SODIUM CHLORIDE 0.9 % IV SOLN
Freq: Once | INTRAVENOUS | Status: AC
  Filled 2020-01-06: qty 250

## 2020-01-06 NOTE — Patient Instructions (Addendum)
[facility] Discharge Instructions for Patients Receiving Chemotherapy  Today you received the following chemotherapy agents: bevacizumab.   To help prevent nausea and vomiting after your treatment, we encourage you to take your nausea medication as directed.   If you develop nausea and vomiting that is not controlled by your nausea medication, call the clinic.   BELOW ARE SYMPTOMS THAT SHOULD BE REPORTED IMMEDIATELY:  *FEVER GREATER THAN 100.5 F  *CHILLS WITH OR WITHOUT FEVER  NAUSEA AND VOMITING THAT IS NOT CONTROLLED WITH YOUR NAUSEA MEDICATION  *UNUSUAL SHORTNESS OF BREATH  *UNUSUAL BRUISING OR BLEEDING  TENDERNESS IN MOUTH AND THROAT WITH OR WITHOUT PRESENCE OF ULCERS  *URINARY PROBLEMS  *BOWEL PROBLEMS  UNUSUAL RASH Items with * indicate a potential emergency and should be followed up as soon as possible.  Feel free to call the clinic should you have any questions or concerns. The clinic phone number is [phone].  Please show the [facility] at check-in to the Emergency Department and triage nurse.  Bevacizumab injection What is this medicine? BEVACIZUMAB (be va SIZ yoo mab) is a monoclonal antibody. It is used to treat many types of cancer. This medicine may be used for other purposes; ask your health care provider or pharmacist if you have questions. COMMON BRAND NAME(S): Avastin, MVASI, Zirabev What should I tell my health care provider before I take this medicine? They need to know if you have any of these conditions:  diabetes  heart disease  high blood pressure  history of coughing up blood  prior anthracycline chemotherapy (e.g., doxorubicin, daunorubicin, epirubicin)  recent or ongoing radiation therapy  recent or planning to have surgery  stroke  an unusual or allergic reaction to bevacizumab, hamster proteins, mouse proteins, other medicines, foods, dyes, or preservatives  pregnant or trying to get  pregnant  breast-feeding How should I use this medicine? This medicine is for infusion into a vein. It is given by a health care professional in a hospital or clinic setting. Talk to your pediatrician regarding the use of this medicine in children. Special care may be needed. Overdosage: If you think you have taken too much of this medicine contact a poison control center or emergency room at once. NOTE: This medicine is only for you. Do not share this medicine with others. What if I miss a dose? It is important not to miss your dose. Call your doctor or health care professional if you are unable to keep an appointment. What may interact with this medicine? Interactions are not expected. This list may not describe all possible interactions. Give your health care provider a list of all the medicines, herbs, non-prescription drugs, or dietary supplements you use. Also tell them if you smoke, drink alcohol, or use illegal drugs. Some items may interact with your medicine. What should I watch for while using this medicine? Your condition will be monitored carefully while you are receiving this medicine. You will need important blood work and urine testing done while you are taking this medicine. This medicine may increase your risk to bruise or bleed. Call your doctor or health care professional if you notice any unusual bleeding. Before having surgery, talk to your health care provider to make sure it is ok. This drug can increase the risk of poor healing of your surgical site or wound. You will need to stop this drug for 28 days before surgery. After surgery, wait at least 28 days before restarting this drug. Make sure the  surgical site or wound is healed enough before restarting this drug. Talk to your health care provider if questions. Do not become pregnant while taking this medicine or for 6 months after stopping it. Women should inform their doctor if they wish to become pregnant or think they  might be pregnant. There is a potential for serious side effects to an unborn child. Talk to your health care professional or pharmacist for more information. Do not breast-feed an infant while taking this medicine and for 6 months after the last dose. This medicine has caused ovarian failure in some women. This medicine may interfere with the ability to have a child. You should talk to your doctor or health care professional if you are concerned about your fertility. What side effects may I notice from receiving this medicine? Side effects that you should report to your doctor or health care professional as soon as possible:  allergic reactions like skin rash, itching or hives, swelling of the face, lips, or tongue  chest pain or chest tightness  chills  coughing up blood  high fever  seizures  severe constipation  signs and symptoms of bleeding such as bloody or black, tarry stools; red or dark-brown urine; spitting up blood or brown material that looks like coffee grounds; red spots on the skin; unusual bruising or bleeding from the eye, gums, or nose  signs and symptoms of a blood clot such as breathing problems; chest pain; severe, sudden headache; pain, swelling, warmth in the leg  signs and symptoms of a stroke like changes in vision; confusion; trouble speaking or understanding; severe headaches; sudden numbness or weakness of the face, arm or leg; trouble walking; dizziness; loss of balance or coordination  stomach pain  sweating  swelling of legs or ankles  vomiting  weight gain Side effects that usually do not require medical attention (report to your doctor or health care professional if they continue or are bothersome):  back pain  changes in taste  decreased appetite  dry skin  nausea  tiredness This list may not describe all possible side effects. Call your doctor for medical advice about side effects. You may report side effects to FDA at  1-800-FDA-1088. Where should I keep my medicine? This drug is given in a hospital or clinic and will not be stored at home. NOTE: This sheet is a summary. It may not cover all possible information. If you have questions about this medicine, talk to your doctor, pharmacist, or health care provider.  2020 Elsevier/Gold Standard (2019-02-03 10:50:46)

## 2020-01-07 ENCOUNTER — Other Ambulatory Visit (HOSPITAL_COMMUNITY): Payer: Self-pay | Admitting: Urology

## 2020-01-07 MED FILL — ESCITALOPRAM 20 MG TABLET: 20 | 90 days supply | Qty: 90 | Fill #2

## 2020-01-07 MED FILL — MYRBETRIQ ER 50 MG TABLET: 50 | 90 days supply | Qty: 90 | Fill #0

## 2020-01-13 ENCOUNTER — Telehealth: Payer: Self-pay

## 2020-01-13 NOTE — Telephone Encounter (Signed)
Called and given below message. She verbalzied understanding. She will send a mychart message with bp readings.

## 2020-01-13 NOTE — Telephone Encounter (Signed)
-----   Message from Heath Lark, MD sent at 01/13/2020 11:06 AM EDT ----- Regarding: can you call her and ask how is her BP doing?

## 2020-01-17 ENCOUNTER — Telehealth: Payer: Self-pay

## 2020-01-17 NOTE — Telephone Encounter (Signed)
[name] called and left a message. [name] is not feeling well today and [name] is working. After resting her blood pressure was 125/104. [name] is complaining of a headache. [name] rechecked her blood pressure and it is down to 144/90.  Given above message to Dr. Alvy Bimler. Instructed to take tylenol for headache prn and go home to rest today. Instructed to take bp BID and call Thursday with bp readings. [name] verbalized understanding.

## 2020-01-18 ENCOUNTER — Ambulatory Visit (INDEPENDENT_AMBULATORY_CARE_PROVIDER_SITE_OTHER): Payer: 59 | Admitting: Family Medicine

## 2020-01-18 ENCOUNTER — Other Ambulatory Visit: Payer: Self-pay

## 2020-01-18 VITALS — BP 115/76 | HR 58 | Temp 97.7°F | Ht 64.0 in | Wt 192.8 lb

## 2020-01-18 DIAGNOSIS — Z9189 Other specified personal risk factors, not elsewhere classified: Secondary | ICD-10-CM | POA: Diagnosis not present

## 2020-01-18 DIAGNOSIS — E559 Vitamin D deficiency, unspecified: Secondary | ICD-10-CM | POA: Diagnosis not present

## 2020-01-18 DIAGNOSIS — Z6833 Body mass index (BMI) 33.0-33.9, adult: Secondary | ICD-10-CM | POA: Diagnosis not present

## 2020-01-18 DIAGNOSIS — K5901 Slow transit constipation: Secondary | ICD-10-CM | POA: Diagnosis not present

## 2020-01-18 DIAGNOSIS — C569 Malignant neoplasm of unspecified ovary: Secondary | ICD-10-CM | POA: Diagnosis not present

## 2020-01-18 DIAGNOSIS — Z800 Family history of malignant neoplasm of digestive organs: Secondary | ICD-10-CM | POA: Diagnosis not present

## 2020-01-18 DIAGNOSIS — E669 Obesity, unspecified: Secondary | ICD-10-CM

## 2020-01-18 DIAGNOSIS — R7303 Prediabetes: Secondary | ICD-10-CM

## 2020-01-18 DIAGNOSIS — K219 Gastro-esophageal reflux disease without esophagitis: Secondary | ICD-10-CM | POA: Diagnosis not present

## 2020-01-18 MED ORDER — VITAMIN D (ERGOCALCIFEROL) 1.25 MG (50000 UNIT) PO CAPS: 50000.0000 [IU] | ORAL_CAPSULE | ORAL | 0 refills | Status: DC

## 2020-01-18 MED FILL — VIT D2 1.25 MG (50,000 UNIT: 1.25 MG | 28 days supply | Qty: 4 | Fill #0

## 2020-01-20 NOTE — Progress Notes (Signed)
Chief Complaint:   OBESITY [name] is here to discuss her progress with her obesity treatment plan along with follow-up of her obesity related diagnoses. [name] is on the Category 2 Plan and states she is following her eating plan approximately 30% of the time. [name] states she is walking for 45 minutes 3 times per week.  Today's visit was #: 28 Starting weight: 209 lbs Starting date: 04/29/2018 Today's weight: 192 lbs Today's date: 01/18/2020 Total lbs lost to date: 17 lbs Total lbs lost since last in-office visit: 2 lbs  Interim History: [name] did not really do well over the past 2 weeks.  She says she was craving sweets.  She is not getting proteins in.  She says she is sick of chicken and [place].  This is her first office visit with me today.  She was last seen by my colleague, Jake Bathe, FNP, 1 month ago.  She has been sporadic with her visits with [name].  She states she is very busy, but acknowledges if she came more often she would likely be more successful wit her weight loss goals.  She was told to follow-up in 2-3 weeks at her last visit.  Subjective:   1. Vitamin D deficiency [name]'s Vitamin D level was 59.0 on 11/30/2019. She is currently taking prescription vitamin D 50,000 IU each week. She denies nausea, vomiting or muscle weakness.    2. Prediabetes [name] has a diagnosis of prediabetes based on her elevated HgA1c and was informed this puts her at greater risk of developing diabetes. She continues to work on diet and exercise to decrease her risk of diabetes. She denies nausea or hypoglycemia.  She is on metformin, which she takes sporadically, not regularly.  Lab Results  Component Value Date   HGBA1C 5.2 11/30/2019   Lab Results  Component Value Date   INSULIN 23.8 11/30/2019   INSULIN 16.5 05/31/2019   INSULIN 17.6 11/19/2018   INSULIN 25.8 (H) 04/29/2018   3. At risk for osteoporosis [name] was given approximately 16 minutes of osteoporosis prevention counseling  today.   [name] is at risk for osteopenia and osteoporosis due to Vitamin D deficiency, as well as other risk factors.  We discussed the importance of prudent screenings through her PCP's office for prevention.     [name] was encouraged to take her Vitamin D and follow her calcium rich diet.  We will continue to monitor vitamin D levels to ensure treatment is appropriate.   It is recommended that she eventually engage in weight bearing exercises and muscle strengthening exercises to help improve bone density and decrease her risk of osteopenia and osteoporosis.  Assessment/Plan:   1. Vitamin D deficiency Low Vitamin D level contributes to fatigue and are associated with obesity, breast, and colon cancer. She agrees to continue to take prescription Vitamin D @50 ,000 IU every week and will follow-up for routine testing of Vitamin D, at least 2-3 times per year to avoid over-replacement.  Will need lab recheck after she is on supplement for 3 months or so.  -Refill Vitamin D, Ergocalciferol, (DRISDOL) 1.25 MG (50000 UNIT) CAPS capsule; Take 1 capsule (50,000 Units total) by mouth every 7 (seven) days.  Dispense: 4 capsule; Refill: 0  2. Prediabetes [name] will continue to work on weight loss, exercise, and decreasing simple carbohydrates to help decrease the risk of diabetes.  Importance of medication adherence discussed with her to help with sweets/carb cravings in addition to increasing proteins and following the plan.  Labs are UTD.   3. At risk for osteoporosis [name] was given approximately 16 minutes of osteoporosis prevention counseling today.   [name] is at risk for osteopenia and osteoporosis due to Vitamin D deficiency, as well as other risk factors.  We discussed the importance of prudent screenings through her PCP's office for prevention.     [name] was encouraged to take her Vitamin D and follow her calcium rich diet.  We will continue to monitor vitamin D levels to ensure treatment is appropriate.    It is recommended that she eventually engage in weight bearing exercises and muscle strengthening exercises to help improve bone density and decrease her risk of osteopenia and osteoporosis.  4. Class 1 obesity with serious comorbidity and body mass index (BMI) of 33.0 to 33.9 in adult, unspecified obesity type  [name] is currently in the action stage of change. As such, her goal is to continue with weight loss efforts. She has agreed to the Category 2 Plan.   Exercise goals: As is.  Behavioral modification strategies: increasing lean protein intake, decreasing simple carbohydrates, meal planning and cooking strategies, keeping healthy foods in the home, avoiding temptations and planning for success.  [name] has agreed to follow-up with our clinic in 2-3 weeks. She was informed of the importance of frequent follow-up visits to maximize her success with intensive lifestyle modifications for her multiple health conditions.   Objective:   Blood pressure 115/76, pulse (!) 58, temperature 97.7 F (36.5 C), height 5\' 4"  (1.626 m), weight 192 lb 12.8 oz (87.5 kg), SpO2 97 %. Body mass index is 33.09 kg/m.  General: Cooperative, alert, well developed, in no acute distress. HEENT: Conjunctivae and lids unremarkable. Cardiovascular: Regular rhythm.  Lungs: Normal work of breathing. Neurologic: No focal deficits.   Lab Results  Component Value Date   CREATININE 1.20 (H) 01/05/2020   BUN 27 (H) 01/05/2020   NA 138 01/05/2020   K 4.5 01/05/2020   CL 102 01/05/2020   CO2 28 01/05/2020   Lab Results  Component Value Date   ALT 19 01/05/2020   AST 21 01/05/2020   ALKPHOS 74 01/05/2020   BILITOT 0.3 01/05/2020   Lab Results  Component Value Date   HGBA1C 5.2 11/30/2019   HGBA1C 5.3 05/31/2019   HGBA1C 5.2 11/19/2018   HGBA1C 5.5 04/29/2018   Lab Results  Component Value Date   INSULIN 23.8 11/30/2019   INSULIN 16.5 05/31/2019   INSULIN 17.6 11/19/2018   INSULIN 25.8 (H) 04/29/2018     Lab Results  Component Value Date   TSH 0.707 11/30/2019   Lab Results  Component Value Date   CHOL 224 (H) 11/30/2019   HDL 52 11/30/2019   LDLCALC 136 (H) 11/30/2019   TRIG 201 (H) 11/30/2019   Lab Results  Component Value Date   WBC 5.6 01/05/2020   HGB 14.0 01/05/2020   HCT 41.5 01/05/2020   MCV 94.3 01/05/2020   PLT 263 01/05/2020   Attestation Statements:   Reviewed by clinician on day of visit: allergies, medications, problem list, medical history, surgical history, family history, social history, and previous encounter notes.  I, Water quality scientist, CMA, am acting as Location manager for [employer], DO.  I have reviewed the above documentation for accuracy and completeness, and I agree with the above. Mellody Dance, DO

## 2020-01-21 ENCOUNTER — Other Ambulatory Visit: Payer: Self-pay | Admitting: Hematology and Oncology

## 2020-01-21 ENCOUNTER — Telehealth: Payer: Self-pay | Admitting: Hematology and Oncology

## 2020-01-21 ENCOUNTER — Telehealth: Payer: Self-pay

## 2020-01-21 DIAGNOSIS — C569 Malignant neoplasm of unspecified ovary: Secondary | ICD-10-CM

## 2020-01-21 DIAGNOSIS — K0889 Other specified disorders of teeth and supporting structures: Secondary | ICD-10-CM | POA: Insufficient documentation

## 2020-01-21 MED ORDER — AMOXICILLIN 500 MG PO TABS
500.0000 mg | ORAL_TABLET | Freq: Two times a day (BID) | ORAL | 0 refills | Status: DC
Start: 2020-01-21 — End: 2020-02-01

## 2020-01-21 MED FILL — AMOXICILLIN 500 MG CAPSULE: 500 | 7 days supply | Qty: 14 | Fill #0

## 2020-01-21 NOTE — Telephone Encounter (Signed)
Called and given below message. She verbalized understanding. 

## 2020-01-21 NOTE — Telephone Encounter (Signed)
Scheduled appt per 9/30 sch msg - pt is aware of appt date and time.

## 2020-01-21 NOTE — Telephone Encounter (Signed)
I sent in amoxicillin I told her to call back if not better by Monday

## 2020-01-21 NOTE — Telephone Encounter (Signed)
She called and left a message. She is having a very sore mouth. 3  weeks ago she saw her dentist for a cleaning and x-rays. She is using salt water rinses.  Called back and she will come by the office later today and let Dr. Alvy Bimler look inside of her mouth.

## 2020-01-24 ENCOUNTER — Inpatient Hospital Stay: Payer: 59 | Attending: Hematology

## 2020-01-24 ENCOUNTER — Other Ambulatory Visit: Payer: Self-pay

## 2020-01-24 ENCOUNTER — Encounter: Payer: Self-pay | Admitting: Hematology and Oncology

## 2020-01-24 DIAGNOSIS — Z7189 Other specified counseling: Secondary | ICD-10-CM

## 2020-01-24 DIAGNOSIS — C569 Malignant neoplasm of unspecified ovary: Secondary | ICD-10-CM | POA: Diagnosis not present

## 2020-01-24 DIAGNOSIS — K047 Periapical abscess without sinus: Secondary | ICD-10-CM | POA: Insufficient documentation

## 2020-01-24 DIAGNOSIS — Z5112 Encounter for antineoplastic immunotherapy: Secondary | ICD-10-CM | POA: Insufficient documentation

## 2020-01-24 LAB — COMPREHENSIVE METABOLIC PANEL
ALT: 20 U/L (ref 0–44)
AST: 22 U/L (ref 15–41)
Albumin: 3.9 g/dL (ref 3.5–5.0)
Alkaline Phosphatase: 82 U/L (ref 38–126)
Anion gap: 6 (ref 5–15)
BUN: 34 mg/dL — ABNORMAL HIGH (ref 8–23)
CO2: 28 mmol/L (ref 22–32)
Calcium: 9.7 mg/dL (ref 8.9–10.3)
Chloride: 105 mmol/L (ref 98–111)
Creatinine, Ser: 1.22 mg/dL — ABNORMAL HIGH (ref 0.44–1.00)
GFR calc Af Amer: 55 mL/min — ABNORMAL LOW (ref >60)
GFR calc non Af Amer: 47 mL/min — ABNORMAL LOW (ref >60)
Glucose, Bld: 92 mg/dL (ref 70–99)
Potassium: 4.2 mmol/L (ref 3.5–5.1)
Sodium: 139 mmol/L (ref 135–145)
Total Bilirubin: 0.4 mg/dL (ref 0.3–1.2)
Total Protein: 7.3 g/dL (ref 6.5–8.1)

## 2020-01-24 LAB — CBC WITH DIFFERENTIAL (CANCER CENTER ONLY)
Abs Immature Granulocytes: 0.01 10*3/uL (ref 0.00–0.07)
Basophils Absolute: 0.1 10*3/uL (ref 0.0–0.1)
Basophils Relative: 1 %
Eosinophils Absolute: 0.9 10*3/uL — ABNORMAL HIGH (ref 0.0–0.5)
Eosinophils Relative: 14 %
HCT: 42.2 % (ref 36.0–46.0)
Hemoglobin: 14.5 g/dL (ref 12.0–15.0)
Immature Granulocytes: 0 %
Lymphocytes Relative: 32 %
Lymphs Abs: 2 10*3/uL (ref 0.7–4.0)
MCH: 31.5 pg (ref 26.0–34.0)
MCHC: 34.4 g/dL (ref 30.0–36.0)
MCV: 91.5 fL (ref 80.0–100.0)
Monocytes Absolute: 0.4 10*3/uL (ref 0.1–1.0)
Monocytes Relative: 7 %
Neutro Abs: 2.9 10*3/uL (ref 1.7–7.7)
Neutrophils Relative %: 46 %
Platelet Count: 239 10*3/uL (ref 150–400)
RBC: 4.61 MIL/uL (ref 3.87–5.11)
RDW: 11.5 % (ref 11.5–15.5)
WBC Count: 6.3 10*3/uL (ref 4.0–10.5)
nRBC: 0 % (ref 0.0–0.2)

## 2020-01-24 LAB — TOTAL PROTEIN, URINE DIPSTICK: Protein, ur: NEGATIVE mg/dL

## 2020-01-25 ENCOUNTER — Encounter: Payer: Self-pay | Admitting: Hematology and Oncology

## 2020-01-25 ENCOUNTER — Other Ambulatory Visit: Payer: Self-pay

## 2020-01-25 ENCOUNTER — Inpatient Hospital Stay (HOSPITAL_BASED_OUTPATIENT_CLINIC_OR_DEPARTMENT_OTHER): Payer: 59 | Admitting: Hematology and Oncology

## 2020-01-25 DIAGNOSIS — C569 Malignant neoplasm of unspecified ovary: Secondary | ICD-10-CM | POA: Diagnosis not present

## 2020-01-25 DIAGNOSIS — Z5112 Encounter for antineoplastic immunotherapy: Secondary | ICD-10-CM | POA: Diagnosis not present

## 2020-01-25 DIAGNOSIS — K047 Periapical abscess without sinus: Secondary | ICD-10-CM

## 2020-01-25 LAB — INHIBIN B: Inhibin B: 116.7 pg/mL — ABNORMAL HIGH (ref 0.0–16.9)

## 2020-01-25 NOTE — Progress Notes (Signed)
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[facility]) Unfortunately, the patient developed root canal problem/abscess We will hold her treatment and cancelled her treatment this week Due to the urgency of the situation, I recommend she proceed with dental procedure as soon as possible  Dental abscess Unfortunately, she developed dental abscess She need root canal procedure performed as soon as possible I have prescribed antibiotics of her recently and that seems to control the infection so far She has no fever or leukocytosis I will send a letter to her dental surgeon to authorize urgent procedure as soon as possible   No orders of the defined types were placed in this encounter.   All questions were answered. The patient knows to call the clinic with any problems, questions or concerns. The total time spent in the appointment was 20 minutes encounter with patients including review of chart and various tests results, discussions about plan of care and coordination of care plan   Heath Lark, MD 01/25/2020 3:11 PM  INTERVAL HISTORY: Please see below for problem oriented charting. She is seen before cycle two of bevacizumab Her blood pressure was well controlled Last week, she complained of significant dental pain She was prescribed amoxicillin She had urgent dental evaluation this morning and was told she had dental abscess and needed a root canal procedure No recent fever or chills  SUMMARY OF ONCOLOGIC HISTORY: Oncology History Overview Note  2018 - Core biopsy for ER/PR and Foundation One testing performed. Unfortunately, no sufficient tissue for Foundation One testing. ER was 50% and PR 90%. Progressed on carboplatin, Tamoxifen/Megace and letrozole, mixed response on Lupron  AMH: 06/23/19: 108 05/26/19: 9.04 01/05/19: 6.84 09/02/18:  3.72 06/01/18: 3.84 02/24/18: 2.6 11/21/17: 3.26 08/26/17: 1.77 05/27/17: 2.12 01/28/17: 2.47 06/10/16: 2.68 02/06/16: 1.84 05/12/15: 3.27 10/03/14: 2.12  Inhibin B 09/22/2019: 95.9 05/26/19: 90.2 01/05/19: 92 09/02/18: 70.9 06/04/18: 70 02/24/18: 79.5 11/21/17: 85.8 08/26/17: 65.6 05/27/17: 58.9 01/28/17: 198 06/10/16: 118.1 02/06/16: 62.4 05/12/15: 64.6 10/03/14: 58   Malignant granulosa cell tumor of ovary ([place])  1994 Initial Diagnosis   1994   2002 Relapse/Recurrence   Upper abdominal recurrence, resected.     - 09/2000 Chemotherapy   6 cycles of IP cisplatin and etoposide    02/2009 Relapse/Recurrence   CT showed increased size of nodules in pelvis   2010 Surgery   Exlap with section of tumor nodules near cecum and left pelvic sidewall. Tumor: ER negative, PR positive    Treatment Plan Change   Alternated 2 week courses of Megace and Tamoxifen - ended 03/2012   03/2012 PET scan   CT - progressive disease   2013 Treatment Plan Change   Letrozole   01/2016 Imaging   MRI showed progressive disease   2017 Treatment Plan Change   Two weeks of alternating Tamoxifen 20mg  daily and then Megace 40mg  TID   08/2016 Imaging   MRI showed progressive disease with peritoneal implants near liver, in pelvis   01/2017 Treatment Plan Change   Lupron 11.25 q 3 months   11/2017 Imaging   Overall mixed response.   Mixed cystic/solid lesions in the left pelvis are mildly improved.   Cystic peritoneal disease, including the dominant lesion along the posterior right hepatic lobe, is mildly progressed.   Subcapsular lesion along the posterior right hepatic lobe is unchanged.   03/2018 Imaging  MRI A/P: Mixed response of individual peritoneal metastases in the pelvis, as described above. Overall, there has been no significant change in bulk of disease.   Stable cystic peritoneal metastatic disease along the capsular surfaces of the liver and spleen.   No new sites of metastatic disease  identified within the abdomen or pelvis.   08/2018 Imaging   MRI A/P: Status post hysterectomy and bilateral salpingo-oophorectomy.   Mixed cystic/solid peritoneal implants in the abdomen/pelvis, as above. Dominant cystic implant along the posterior liver surface is mildly increased. Remaining lesions are overall grossly unchanged.   No new lesions are identified.   01/28/2019 Imaging   Mri A/P: 1. Relatively similar appearance of peritoneal metastasis. A posterior right hepatic capsular based lesion is similar to minimally decreased in size. Left pelvic implants are primarily similar with possible enlargement of an anterior high left pelvic cystic implant. No new disease identified. 2.  Aortic Atherosclerosis (ICD10-I70.0). 3. Hepatic steatosis. 4. Left adrenal adenoma.   06/02/2019 Imaging   MRI 1. Potential slight enlargement of dominant cystic area and solid component, associated with rind like signal variation on T2 along the inferior right hepatic margin, also potentially slightly increased. Findings may still be within the realm of measurement and technical variation. Close attention on follow-up. 2. Subtle cystic changes along the cephalad margin of the spleen are difficult to see on previous imaging, perhaps new compared with prior imaging studies. 3. Pelvic implants and left lower quadrant lesion with similar size, of the area along the left iliac vasculature may be slightly larger than on the prior study. 4. Signs of extensive retroperitoneal and pelvic lymphadenectomy. 5. Hepatic steatosis. 6. 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     06/08/2019 Cancer Staging   Staging form: [place], AJCC 7th Edition - Clinical: Stage IIIC (rT2, N1, M0) - Signed by Heath Lark, MD on 06/08/2019   06/10/2019 Imaging   1. Multiple redemonstrated partially solid metastatic implants in the hepatorenal recess, left paracolic gutter, left pelvis, and  likely the tip of the spleen as detailed above and as seen on recent prior MRI dated 06/02/2019. These findings are slightly worsened in comparison to a remote prior CT examination dated 10/04/2014.   2.  No evidence of metastatic disease in the chest.   3. Status post hysterectomy, pelvic and retroperitoneal lymph node dissection, and ventral hernia mesh repair.   4.  Hepatic steatosis.   5.  Aortic Atherosclerosis (ICD10-I70.0).   06/24/2019 - 09/02/2019 Chemotherapy   The patient had carboplatin for chemotherapy treatment.     09/02/2019 Tumor Marker   Patient's tumor was tested for the following markers: Inhibin B Results of the tumor marker test revealed 94   09/23/2019 Imaging   1. Interval progression of the soft tissue lesions in the anterior left pelvis, likely peritoneal implants, compatible with disease progression. Remaining sites of apparent disease along the liver capsule and posterior spleen are stable 2. Stable 2 cm left adrenal adenoma. 3. Hepatic steatosis. 4. Right-side predominant colonic diverticulosis without diverticulitis. 5. Aortic Atherosclerosis (ICD10-I70.0).   12/23/2019 Imaging   1. Slight interval increase in size of a mixed solid and cystic nodule in the left hemipelvis measuring 3.0 x 2.9 cm, previously 2.7 x 2.1 cm. 2. Interval decrease in size of a nodule in the left paracolic gutter measuring 1.0 x 0.8 cm, previously 2.1 x 1.6 cm. 3. Stable peritoneal nodules in the hepatorenal recess and at the inferior tip  of the spleen. 4. Unchanged nodule or lymph node overlying the left external iliac artery. 5. Enlargement of dominant left pelvic nodule is concerning for disease progression despite interval decrease in size of a nodule in the left paracolic gutter and stability of other nodules. 6. No evidence of metastatic disease in the chest. 7. Stable, benign left adrenal adenoma. 8. Ventral hernia mesh repair with a small component of recurrent hernia  inferiorly, containing a single nonobstructed loop of small bowel. 9. Hepatic steatosis. 10. Aortic Atherosclerosis (ICD10-I70.0).   12/23/2019 Tumor Marker   Patient's tumor was tested for the following markers: Inhibin B Results of the tumor marker test revealed 94.9     REVIEW OF SYSTEMS:   Constitutional: Denies fevers, chills or abnormal weight loss Eyes: Denies blurriness of vision Ears, nose, mouth, throat, and face: Denies mucositis or sore throat Respiratory: Denies cough, dyspnea or wheezes Cardiovascular: Denies palpitation, chest discomfort or lower extremity swelling Gastrointestinal:  Denies nausea, heartburn or change in bowel habits Skin: Denies abnormal skin rashes Lymphatics: Denies new lymphadenopathy or easy bruising Neurological:Denies numbness, tingling or new weaknesses Behavioral/Psych: Mood is stable, no new changes  All other systems were reviewed with the patient and are negative.  I have reviewed the past medical history, past surgical history, social history and family history with the patient and they are unchanged from previous note.  ALLERGIES:  is allergic to codeine, oxycodone, sodium thiosalicylate, ciprofloxacin, codone [hydrocodone bitartrate], daypro [oxaprozin], levofloxacin, stadol [butorphanol tartrate], and sulfa antibiotics.  MEDICATIONS:  Current Outpatient Medications  Medication Sig Dispense Refill  . amoxicillin (AMOXIL) 500 MG tablet Take 1 tablet (500 mg total) by mouth 2 (two) times daily. 14 tablet 0  . aspirin 325 MG tablet Take 325 mg by mouth daily.    . Bevacizumab (AVASTIN IV) Inject into the vein. Every 3 weeks    . Biotin 10 MG CAPS Take by mouth.    . Calcium Citrate 200 MG TABS Take by mouth.    . escitalopram (LEXAPRO) 20 MG tablet Take 10 mg by mouth daily.     . metFORMIN (GLUCOPHAGE) 1000 MG tablet 1/2 tab BID with meals 60 tablet 0  . mirabegron ER (MYRBETRIQ) 25 MG TB24 tablet Take 25 mg by mouth daily.    .  Multiple Vitamin (MULTIVITAMIN) capsule Take 1 capsule by mouth daily.    . Naltrexone-buPROPion HCl ER (CONTRAVE) 8-90 MG TB12 Take 2 tablets by mouth daily. 120 tablet 0  . omeprazole (PRILOSEC) 20 MG capsule Take 20 mg by mouth daily.     . Probiotic Product (ALIGN PO) Take 1 tablet by mouth daily.     . Vitamin D, Ergocalciferol, (DRISDOL) 1.25 MG (50000 UNIT) CAPS capsule Take 1 capsule (50,000 Units total) by mouth every 7 (seven) days. 4 capsule 0   No current facility-administered medications for this visit.    PHYSICAL EXAMINATION: ECOG PERFORMANCE STATUS: 1 - Symptomatic but completely ambulatory  Vitals:   01/25/20 1217  BP: (!) 129/54  Pulse: 63  Resp: 18  Temp: 97.6 F (36.4 C)  SpO2: 99%   Filed Weights   01/25/20 1217  Weight: 196 lb 12.8 oz (89.3 kg)    GENERAL:alert, no distress and comfortable NEURO: alert & oriented x 3 with fluent speech, no focal motor/sensory deficits  LABORATORY DATA:  I have reviewed the data as listed    Component Value Date/Time   NA 139 01/24/2020 1530   NA 139 11/30/2019 0913   NA 141 09/24/2013  1131   K 4.2 01/24/2020 1530   K 4.5 09/24/2013 1131   CL 105 01/24/2020 1530   CO2 28 01/24/2020 1530   CO2 24 09/24/2013 1131   GLUCOSE 92 01/24/2020 1530   GLUCOSE 90 09/24/2013 1131   BUN 34 (H) 01/24/2020 1530   BUN 32 (H) 11/30/2019 0913   BUN 21.2 09/24/2013 1131   CREATININE 1.22 (H) 01/24/2020 1530   CREATININE 1.18 (H) 12/21/2019 1259   CREATININE 1.1 09/24/2013 1131   CALCIUM 9.7 01/24/2020 1530   CALCIUM 9.5 09/24/2013 1131   PROT 7.3 01/24/2020 1530   PROT 7.0 11/30/2019 0913   PROT 6.8 09/24/2013 1131   ALBUMIN 3.9 01/24/2020 1530   ALBUMIN 4.3 11/30/2019 0913   ALBUMIN 3.9 09/24/2013 1131   AST 22 01/24/2020 1530   AST 18 12/21/2019 1259   AST 23 09/24/2013 1131   ALT 20 01/24/2020 1530   ALT 18 12/21/2019 1259   ALT 27 09/24/2013 1131   ALKPHOS 82 01/24/2020 1530   ALKPHOS 81 09/24/2013 1131   BILITOT  0.4 01/24/2020 1530   BILITOT 0.3 12/21/2019 1259   BILITOT 0.53 09/24/2013 1131   GFRNONAA 47 (L) 01/24/2020 1530   GFRNONAA 49 (L) 12/21/2019 1259   GFRAA 55 (L) 01/24/2020 1530   GFRAA 57 (L) 12/21/2019 1259    No results found for: SPEP, UPEP  Lab Results  Component Value Date   WBC 6.3 01/24/2020   NEUTROABS 2.9 01/24/2020   HGB 14.5 01/24/2020   HCT 42.2 01/24/2020   MCV 91.5 01/24/2020   PLT 239 01/24/2020      Chemistry      Component Value Date/Time   NA 139 01/24/2020 1530   NA 139 11/30/2019 0913   NA 141 09/24/2013 1131   K 4.2 01/24/2020 1530   K 4.5 09/24/2013 1131   CL 105 01/24/2020 1530   CO2 28 01/24/2020 1530   CO2 24 09/24/2013 1131   BUN 34 (H) 01/24/2020 1530   BUN 32 (H) 11/30/2019 0913   BUN 21.2 09/24/2013 1131   CREATININE 1.22 (H) 01/24/2020 1530   CREATININE 1.18 (H) 12/21/2019 1259   CREATININE 1.1 09/24/2013 1131      Component Value Date/Time   CALCIUM 9.7 01/24/2020 1530   CALCIUM 9.5 09/24/2013 1131   ALKPHOS 82 01/24/2020 1530   ALKPHOS 81 09/24/2013 1131   AST 22 01/24/2020 1530   AST 18 12/21/2019 1259   AST 23 09/24/2013 1131   ALT 20 01/24/2020 1530   ALT 18 12/21/2019 1259   ALT 27 09/24/2013 1131   BILITOT 0.4 01/24/2020 1530   BILITOT 0.3 12/21/2019 1259   BILITOT 0.53 09/24/2013 1131

## 2020-01-25 NOTE — Assessment & Plan Note (Signed)
Unfortunately, the patient developed root canal problem/abscess We will hold her treatment and cancelled her treatment this week Due to the urgency of the situation, I recommend she proceed with dental procedure as soon as possible

## 2020-01-25 NOTE — Assessment & Plan Note (Signed)
Unfortunately, she developed dental abscess She need root canal procedure performed as soon as possible I have prescribed antibiotics of her recently and that seems to control the infection so far She has no fever or leukocytosis I will send a letter to her dental surgeon to authorize urgent procedure as soon as possible

## 2020-01-25 NOTE — Progress Notes (Signed)
Per pt request, Dr Alvy Bimler last progress note sent to endodontist, Dr Anselm Lis Fax# [phone]. Fax confirmation received.

## 2020-01-26 ENCOUNTER — Other Ambulatory Visit: Payer: 59

## 2020-01-26 ENCOUNTER — Ambulatory Visit: Payer: 59 | Admitting: Hematology and Oncology

## 2020-01-27 ENCOUNTER — Telehealth: Payer: Self-pay

## 2020-01-27 ENCOUNTER — Ambulatory Visit: Payer: 59

## 2020-01-27 NOTE — Telephone Encounter (Signed)
Called and given message from Dr. Alvy Bimler. Okay to take the tylenol and motrin for pain. She verbalized understanding.

## 2020-01-27 NOTE — Telephone Encounter (Signed)
-----   Message from Heath Lark, MD sent at 01/27/2020  9:35 AM EDT ----- Regarding: RE: pain medication yes ----- Message ----- From: Flo Shanks, RN Sent: 01/26/2020   4:12 PM EDT To: Heath Lark, MD Subject: pain medication                                She called and left a message. For her pain they are recommending extra-strength tylenol and motrin 400 mg for pain. Are you okay with that?

## 2020-02-01 ENCOUNTER — Other Ambulatory Visit (INDEPENDENT_AMBULATORY_CARE_PROVIDER_SITE_OTHER): Payer: Self-pay | Admitting: Bariatrics

## 2020-02-01 ENCOUNTER — Encounter (INDEPENDENT_AMBULATORY_CARE_PROVIDER_SITE_OTHER): Payer: Self-pay | Admitting: Bariatrics

## 2020-02-01 ENCOUNTER — Other Ambulatory Visit: Payer: Self-pay

## 2020-02-01 ENCOUNTER — Ambulatory Visit (INDEPENDENT_AMBULATORY_CARE_PROVIDER_SITE_OTHER): Payer: 59 | Admitting: Bariatrics

## 2020-02-01 VITALS — BP 108/67 | HR 66 | Temp 97.6°F | Ht 64.0 in | Wt 191.0 lb

## 2020-02-01 DIAGNOSIS — F5089 Other specified eating disorder: Secondary | ICD-10-CM

## 2020-02-01 DIAGNOSIS — Z9189 Other specified personal risk factors, not elsewhere classified: Secondary | ICD-10-CM | POA: Diagnosis not present

## 2020-02-01 DIAGNOSIS — E669 Obesity, unspecified: Secondary | ICD-10-CM

## 2020-02-01 DIAGNOSIS — Z6830 Body mass index (BMI) 30.0-30.9, adult: Secondary | ICD-10-CM | POA: Diagnosis not present

## 2020-02-01 DIAGNOSIS — E559 Vitamin D deficiency, unspecified: Secondary | ICD-10-CM

## 2020-02-01 DIAGNOSIS — R7303 Prediabetes: Secondary | ICD-10-CM | POA: Diagnosis not present

## 2020-02-01 MED ORDER — BUPROPION HCL ER (SR) 200 MG PO TB12: 200.0000 mg | ORAL_TABLET | Freq: Every day | ORAL | 0 refills | Status: DC

## 2020-02-01 MED FILL — BUPROPION HCL SR 200 MG TAB: 200 | 30 days supply | Qty: 30 | Fill #0

## 2020-02-02 ENCOUNTER — Encounter (INDEPENDENT_AMBULATORY_CARE_PROVIDER_SITE_OTHER): Payer: Self-pay | Admitting: Bariatrics

## 2020-02-02 NOTE — Progress Notes (Signed)
Chief Complaint:   OBESITY [name] is here to discuss her progress with her obesity treatment plan along with follow-up of her obesity related diagnoses. [name] is on the Category 2 Plan and states she is following her eating plan approximately 60% of the time. [name] states she is walking 30 minutes 3 times per week and abs 20 minutes 2 times per week.  Today's visit was #: 33 Starting weight: 209 lbs Starting date: 04/29/2018 Today's weight: 191 lbs Today's date: 02/01/2020 Total lbs lost to date: 18 Total lbs lost since last in-office visit: 1  Interim History: [name] is down 1 additional lb since her last visit. She currently is having dental pain and doing more carbohydrates. She has taken Contrave, but wants to stop as it is not effective.  Subjective:   Prediabetes. [name] has a diagnosis of prediabetes based on her elevated HgA1c and was informed this puts her at greater risk of developing diabetes. She continues to work on diet and exercise to decrease her risk of diabetes. She denies nausea or hypoglycemia. [name] is taking metformin.  Lab Results  Component Value Date   HGBA1C 5.2 11/30/2019   Lab Results  Component Value Date   INSULIN 23.8 11/30/2019   INSULIN 16.5 05/31/2019   INSULIN 17.6 11/19/2018   INSULIN 25.8 (H) 04/29/2018   Vitamin D deficiency. [name] is taking high dose Vitamin D supplementation.    Ref. Range 11/30/2019 09:13  Vitamin D, 25-Hydroxy Latest Ref Range: 30.0 - 100.0 ng/mL 59.0   Other disorder of eating. [name] reports stress and emotional eating.  At risk for diabetes mellitus. [name] is at higher than average risk for developing diabetes due to her obesity.   Assessment/Plan:   Prediabetes. [name] will continue to work on weight loss, exercise, and decreasing simple carbohydrates to help decrease the risk of diabetes. She will continue metformin as directed.   Vitamin D deficiency. Low Vitamin D level contributes to fatigue and  are associated with obesity, breast, and colon cancer. She agrees to continue to take Vitamin D as directed and will follow-up for routine testing of Vitamin D, at least 2-3 times per year to avoid over-replacement.  Other disorder of eating. Prescription was given for buPROPion (WELLBUTRIN SR) 200 MG 12 hr tablet 1 PO daily #30 with 0 refills.  At risk for diabetes mellitus. [name] was given approximately 15 minutes of diabetes education and counseling today. We discussed intensive lifestyle modifications today with an emphasis on weight loss as well as increasing exercise and decreasing simple carbohydrates in her diet. We also reviewed medication options with an emphasis on risk versus benefit of those discussed.   Repetitive spaced learning was employed today to elicit superior memory formation and behavioral change.  Class 1 obesity with serious comorbidity and body mass index (BMI) of 30.0 to 30.9 in adult, unspecified obesity type.  [name] is currently in the action stage of change. As such, her goal is to continue with weight loss efforts. She has agreed to the Category 2 Plan.   She will work on meal planning and intentional eating.   She will stop the Contrave.  Exercise goals: [name] will continue walking 3 times per week.  Behavioral modification strategies: increasing lean protein intake, decreasing simple carbohydrates, increasing vegetables, increasing water intake, decreasing eating out, no skipping meals, meal planning and cooking strategies, keeping healthy foods in the home and planning for success.  [name] has agreed to follow-up with our clinic in  2 weeks. She was informed of the importance of frequent follow-up visits to maximize her success with intensive lifestyle modifications for her multiple health conditions.   Objective:   Blood pressure 108/67, pulse 66, temperature 97.6 F (36.4 C), height 5\' 4"  (1.626 m), weight 191 lb (86.6 kg), SpO2 97 %. Body mass index is  32.79 kg/m.  General: Cooperative, alert, well developed, in no acute distress. HEENT: Conjunctivae and lids unremarkable. Cardiovascular: Regular rhythm.  Lungs: Normal work of breathing. Neurologic: No focal deficits.   Lab Results  Component Value Date   CREATININE 1.22 (H) 01/24/2020   BUN 34 (H) 01/24/2020   NA 139 01/24/2020   K 4.2 01/24/2020   CL 105 01/24/2020   CO2 28 01/24/2020   Lab Results  Component Value Date   ALT 20 01/24/2020   AST 22 01/24/2020   ALKPHOS 82 01/24/2020   BILITOT 0.4 01/24/2020   Lab Results  Component Value Date   HGBA1C 5.2 11/30/2019   HGBA1C 5.3 05/31/2019   HGBA1C 5.2 11/19/2018   HGBA1C 5.5 04/29/2018   Lab Results  Component Value Date   INSULIN 23.8 11/30/2019   INSULIN 16.5 05/31/2019   INSULIN 17.6 11/19/2018   INSULIN 25.8 (H) 04/29/2018   Lab Results  Component Value Date   TSH 0.707 11/30/2019   Lab Results  Component Value Date   CHOL 224 (H) 11/30/2019   HDL 52 11/30/2019   LDLCALC 136 (H) 11/30/2019   TRIG 201 (H) 11/30/2019   Lab Results  Component Value Date   WBC 6.3 01/24/2020   HGB 14.5 01/24/2020   HCT 42.2 01/24/2020   MCV 91.5 01/24/2020   PLT 239 01/24/2020   No results found for: IRON, TIBC, FERRITIN  Attestation Statements:   Reviewed by clinician on day of visit: allergies, medications, problem list, medical history, surgical history, family history, social history, and previous encounter notes.  Migdalia Dk, am acting as Location manager for [employer], DO   I have reviewed the above documentation for accuracy and completeness, and I agree with the above. Jearld Lesch, DO

## 2020-02-03 ENCOUNTER — Ambulatory Visit (INDEPENDENT_AMBULATORY_CARE_PROVIDER_SITE_OTHER): Payer: 59 | Admitting: Adult Health

## 2020-02-03 ENCOUNTER — Encounter: Payer: Self-pay | Admitting: Hematology and Oncology

## 2020-02-07 ENCOUNTER — Encounter: Payer: Self-pay | Admitting: Hematology and Oncology

## 2020-02-07 ENCOUNTER — Other Ambulatory Visit: Payer: Self-pay | Admitting: Hematology and Oncology

## 2020-02-07 ENCOUNTER — Telehealth: Payer: Self-pay | Admitting: Hematology and Oncology

## 2020-02-07 NOTE — Telephone Encounter (Signed)
Scheduled appt per 10/18 sch msg - pt is aware of appt date and time

## 2020-02-09 ENCOUNTER — Inpatient Hospital Stay: Payer: 59

## 2020-02-09 ENCOUNTER — Other Ambulatory Visit: Payer: Self-pay

## 2020-02-09 DIAGNOSIS — C569 Malignant neoplasm of unspecified ovary: Secondary | ICD-10-CM | POA: Diagnosis not present

## 2020-02-09 DIAGNOSIS — Z7189 Other specified counseling: Secondary | ICD-10-CM

## 2020-02-09 DIAGNOSIS — K047 Periapical abscess without sinus: Secondary | ICD-10-CM | POA: Diagnosis not present

## 2020-02-09 DIAGNOSIS — Z5112 Encounter for antineoplastic immunotherapy: Secondary | ICD-10-CM | POA: Diagnosis not present

## 2020-02-09 LAB — COMPREHENSIVE METABOLIC PANEL
ALT: 17 U/L (ref 0–44)
AST: 19 U/L (ref 15–41)
Albumin: 3.9 g/dL (ref 3.5–5.0)
Alkaline Phosphatase: 75 U/L (ref 38–126)
Anion gap: 9 (ref 5–15)
BUN: 32 mg/dL — ABNORMAL HIGH (ref 8–23)
CO2: 23 mmol/L (ref 22–32)
Calcium: 9.9 mg/dL (ref 8.9–10.3)
Chloride: 104 mmol/L (ref 98–111)
Creatinine, Ser: 1.29 mg/dL — ABNORMAL HIGH (ref 0.44–1.00)
GFR, Estimated: 44 mL/min — ABNORMAL LOW (ref >60)
Glucose, Bld: 115 mg/dL — ABNORMAL HIGH (ref 70–99)
Potassium: 4.2 mmol/L (ref 3.5–5.1)
Sodium: 136 mmol/L (ref 135–145)
Total Bilirubin: 0.3 mg/dL (ref 0.3–1.2)
Total Protein: 7.3 g/dL (ref 6.5–8.1)

## 2020-02-09 LAB — CBC WITH DIFFERENTIAL (CANCER CENTER ONLY)
Abs Immature Granulocytes: 0.01 10*3/uL (ref 0.00–0.07)
Basophils Absolute: 0 10*3/uL (ref 0.0–0.1)
Basophils Relative: 1 %
Eosinophils Absolute: 0.8 10*3/uL — ABNORMAL HIGH (ref 0.0–0.5)
Eosinophils Relative: 12 %
HCT: 41.2 % (ref 36.0–46.0)
Hemoglobin: 14.2 g/dL (ref 12.0–15.0)
Immature Granulocytes: 0 %
Lymphocytes Relative: 30 %
Lymphs Abs: 2 10*3/uL (ref 0.7–4.0)
MCH: 30.7 pg (ref 26.0–34.0)
MCHC: 34.5 g/dL (ref 30.0–36.0)
MCV: 89 fL (ref 80.0–100.0)
Monocytes Absolute: 0.5 10*3/uL (ref 0.1–1.0)
Monocytes Relative: 7 %
Neutro Abs: 3.4 10*3/uL (ref 1.7–7.7)
Neutrophils Relative %: 50 %
Platelet Count: 269 10*3/uL (ref 150–400)
RBC: 4.63 MIL/uL (ref 3.87–5.11)
RDW: 11.5 % (ref 11.5–15.5)
WBC Count: 6.8 10*3/uL (ref 4.0–10.5)
nRBC: 0 % (ref 0.0–0.2)

## 2020-02-09 LAB — TOTAL PROTEIN, URINE DIPSTICK: Protein, ur: NEGATIVE mg/dL

## 2020-02-09 NOTE — Progress Notes (Signed)
Order obtained to initiate IV access. IV started without difficulty.

## 2020-02-10 ENCOUNTER — Inpatient Hospital Stay: Payer: 59

## 2020-02-10 ENCOUNTER — Other Ambulatory Visit: Payer: Self-pay

## 2020-02-10 VITALS — BP 111/53 | HR 64 | Temp 97.9°F | Resp 18 | Wt 196.2 lb

## 2020-02-10 DIAGNOSIS — C569 Malignant neoplasm of unspecified ovary: Secondary | ICD-10-CM

## 2020-02-10 DIAGNOSIS — K047 Periapical abscess without sinus: Secondary | ICD-10-CM | POA: Diagnosis not present

## 2020-02-10 DIAGNOSIS — Z7189 Other specified counseling: Secondary | ICD-10-CM

## 2020-02-10 DIAGNOSIS — Z5112 Encounter for antineoplastic immunotherapy: Secondary | ICD-10-CM | POA: Diagnosis not present

## 2020-02-10 MED ORDER — SODIUM CHLORIDE 0.9 % IV SOLN
15.0000 mg/kg | INTRAVENOUS | Status: DC
  Administered 2020-02-10: 1400 mg via INTRAVENOUS
  Filled 2020-02-10: qty 8

## 2020-02-10 MED ORDER — SODIUM CHLORIDE 0.9 % IV SOLN
Freq: Once | INTRAVENOUS | Status: AC
  Filled 2020-02-10: qty 250

## 2020-02-10 NOTE — Patient Instructions (Signed)
[facility] Discharge Instructions for Patients Receiving Chemotherapy  Today you received the following chemotherapy agents: bevacizumab  To help prevent nausea and vomiting after your treatment, we encourage you to take your nausea medication as directed.   If you develop nausea and vomiting that is not controlled by your nausea medication, call the clinic.   BELOW ARE SYMPTOMS THAT SHOULD BE REPORTED IMMEDIATELY:  *FEVER GREATER THAN 100.5 F  *CHILLS WITH OR WITHOUT FEVER  NAUSEA AND VOMITING THAT IS NOT CONTROLLED WITH YOUR NAUSEA MEDICATION  *UNUSUAL SHORTNESS OF BREATH  *UNUSUAL BRUISING OR BLEEDING  TENDERNESS IN MOUTH AND THROAT WITH OR WITHOUT PRESENCE OF ULCERS  *URINARY PROBLEMS  *BOWEL PROBLEMS  UNUSUAL RASH Items with * indicate a potential emergency and should be followed up as soon as possible.  Feel free to call the clinic should you have any questions or concerns. The clinic phone number is [phone].  Please show the CHEMO ALERT CARD at check-in to the Emergency Department and triage nurse.   

## 2020-02-14 ENCOUNTER — Encounter: Payer: Self-pay | Admitting: Genetic Counselor

## 2020-02-14 ENCOUNTER — Inpatient Hospital Stay (HOSPITAL_BASED_OUTPATIENT_CLINIC_OR_DEPARTMENT_OTHER): Payer: 59 | Admitting: Genetic Counselor

## 2020-02-14 ENCOUNTER — Inpatient Hospital Stay: Payer: 59

## 2020-02-14 ENCOUNTER — Other Ambulatory Visit: Payer: Self-pay

## 2020-02-14 DIAGNOSIS — R5383 Other fatigue: Secondary | ICD-10-CM | POA: Diagnosis not present

## 2020-02-14 DIAGNOSIS — Z20828 Contact with and (suspected) exposure to other viral communicable diseases: Secondary | ICD-10-CM | POA: Diagnosis not present

## 2020-02-14 DIAGNOSIS — Z800 Family history of malignant neoplasm of digestive organs: Secondary | ICD-10-CM

## 2020-02-14 DIAGNOSIS — R070 Pain in throat: Secondary | ICD-10-CM | POA: Diagnosis not present

## 2020-02-14 DIAGNOSIS — R519 Headache, unspecified: Secondary | ICD-10-CM | POA: Diagnosis not present

## 2020-02-14 DIAGNOSIS — C569 Malignant neoplasm of unspecified ovary: Secondary | ICD-10-CM

## 2020-02-14 DIAGNOSIS — J069 Acute upper respiratory infection, unspecified: Secondary | ICD-10-CM | POA: Diagnosis not present

## 2020-02-14 DIAGNOSIS — R051 Acute cough: Secondary | ICD-10-CM | POA: Diagnosis not present

## 2020-02-15 ENCOUNTER — Encounter: Payer: Self-pay | Admitting: Hematology and Oncology

## 2020-02-15 ENCOUNTER — Encounter: Payer: Self-pay | Admitting: Genetic Counselor

## 2020-02-15 DIAGNOSIS — Z800 Family history of malignant neoplasm of digestive organs: Secondary | ICD-10-CM

## 2020-02-15 HISTORY — DX: Family history of malignant neoplasm of digestive organs: Z80.0

## 2020-02-15 NOTE — Progress Notes (Signed)
REFERRING PROVIDER: Ronald Lobo, MD [address] [place],  [place] [address]  PRIMARY PROVIDER:  [name], MD  PRIMARY REASON FOR VISIT:  1. Malignant granulosa cell tumor of ovary, unspecified laterality ([place])   2. Family history of colon cancer    HISTORY OF PRESENT ILLNESS:   [name], a 64 y.o. female, was seen for a [facility] cancer genetics consultation at the request of Dr. Cristina Gong due to a personal and family history of cancer.  [name] presents to clinic today to discuss the possibility of a hereditary predisposition to cancer, genetic testing, and to further clarify her future cancer risks, as well as potential cancer risks for family members.   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  In 2000, she had a total abdominal hysterectomy and bilateral salpingo-oophorectomy.   CANCER HISTORY:  Oncology History Overview Note  2018 - Core biopsy for ER/PR and Foundation One testing performed. Unfortunately, no sufficient tissue for Foundation One testing. ER was 50% and PR 90%. Progressed on carboplatin, Tamoxifen/Megace and letrozole, mixed response on Lupron  AMH: 06/23/19: 108 05/26/19: 9.04 01/05/19: 6.84 09/02/18: 3.72 06/01/18: 3.84 02/24/18: 2.6 11/21/17: 3.26 08/26/17: 1.77 05/27/17: 2.12 01/28/17: 2.47 06/10/16: 2.68 02/06/16: 1.84 05/12/15: 3.27 10/03/14: 2.12  Inhibin B 09/22/2019: 95.9 05/26/19: 90.2 01/05/19: 92 09/02/18: 70.9 06/04/18: 70 02/24/18: 79.5 11/21/17: 85.8 08/26/17: 65.6 05/27/17: 58.9 01/28/17: 198 06/10/16: 118.1 02/06/16: 62.4 05/12/15: 64.6 10/03/14: 58   Malignant granulosa cell tumor of ovary ([place])  1994 Initial Diagnosis   1994   2002 Relapse/Recurrence   Upper abdominal recurrence, resected.     - 09/2000 Chemotherapy   6 cycles of IP cisplatin and etoposide    02/2009 Relapse/Recurrence   CT showed increased size of nodules in pelvis     2010 Surgery   Exlap with section of tumor nodules near cecum and left pelvic sidewall. Tumor: ER negative, PR positive    Treatment Plan Change   Alternated 2 week courses of Megace and Tamoxifen - ended 03/2012   03/2012 PET scan   CT - progressive disease   2013 Treatment Plan Change   Letrozole   01/2016 Imaging   MRI showed progressive disease   2017 Treatment Plan Change   Two weeks of alternating Tamoxifen 73m daily and then Megace 426mTID   08/2016 Imaging   MRI showed progressive disease with peritoneal implants near liver, in pelvis   01/2017 Treatment Plan Change   Lupron 11.25 q 3 months   11/2017 Imaging   Overall mixed response.   Mixed cystic/solid lesions in the left pelvis are mildly improved.   Cystic peritoneal disease, including the dominant lesion along the posterior right hepatic lobe, is mildly progressed.   Subcapsular lesion along the posterior right hepatic lobe is unchanged.   03/2018 Imaging   MRI A/P: Mixed response of individual peritoneal metastases in the pelvis, as described above. Overall, there has been no significant change in bulk of disease.   Stable cystic peritoneal metastatic disease along the capsular surfaces of the liver and spleen.   No new sites of metastatic disease identified within the abdomen or pelvis.   08/2018 Imaging   MRI A/P: Status post hysterectomy and bilateral salpingo-oophorectomy.   Mixed cystic/solid peritoneal implants in the abdomen/pelvis, as above. Dominant cystic implant along the posterior liver surface is mildly increased. Remaining lesions are overall grossly unchanged.  No new lesions are identified.   01/28/2019 Imaging   Mri A/P: 1. Relatively similar appearance of peritoneal metastasis. A posterior right hepatic capsular based lesion is similar to minimally decreased in size. Left pelvic implants are primarily similar with possible enlargement of an anterior high left pelvic cystic implant.  No new disease identified. 2.  Aortic Atherosclerosis (ICD10-I70.0). 3. Hepatic steatosis. 4. Left adrenal adenoma.   06/02/2019 Imaging   MRI 1. Potential slight enlargement of dominant cystic area and solid component, associated with rind like signal variation on T2 along the inferior right hepatic margin, also potentially slightly increased. Findings may still be within the realm of measurement and technical variation. Close attention on follow-up. 2. Subtle cystic changes along the cephalad margin of the spleen are difficult to see on previous imaging, perhaps new compared with prior imaging studies. 3. Pelvic implants and left lower quadrant lesion with similar size, of the area along the left iliac vasculature may be slightly larger than on the prior study. 4. Signs of extensive retroperitoneal and pelvic lymphadenectomy. 5. Hepatic steatosis. 6. 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     06/08/2019 Cancer Staging   Staging form: Ovary, AJCC 7th Edition - Clinical: Stage IIIC (rT2, N1, M0) - Signed by Heath Lark, MD on 06/08/2019   06/10/2019 Imaging   1. 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 These findings are slightly worsened in comparison to a remote prior CT examination dated 10/04/2014.   2.  No evidence of metastatic disease in the chest.   3. Status post hysterectomy, pelvic and retroperitoneal lymph node dissection, and ventral hernia mesh repair.   4.  Hepatic steatosis.   5.  Aortic Atherosclerosis (ICD10-I70.0).   06/24/2019 - 09/02/2019 Chemotherapy   The patient had carboplatin for chemotherapy treatment.     09/02/2019 Tumor Marker   Patient's tumor was tested for the following markers: Inhibin B Results of the tumor marker test  revealed 94   09/23/2019 Imaging   1. Interval progression of the soft tissue lesions in the anterior left pelvis, likely peritoneal implants, compatible with disease progression. Remaining sites of apparent disease along the liver capsule and posterior spleen are stable 2. Stable 2 cm left adrenal adenoma. 3. Hepatic steatosis. 4. Right-side predominant colonic diverticulosis without diverticulitis. 5. Aortic Atherosclerosis (ICD10-I70.0).   12/23/2019 Imaging   1. Slight interval increase in size of a mixed solid and cystic nodule in the left hemipelvis measuring 3.0 x 2.9 cm, previously 2.7 x 2.1 cm. 2. Interval decrease in size of a nodule in the left paracolic gutter measuring 1.0 x 0.8 cm, previously 2.1 x 1.6 cm. 3. Stable peritoneal nodules in the hepatorenal recess and at the inferior tip of the spleen. 4. Unchanged nodule or lymph node overlying the left external iliac artery. 5. Enlargement of dominant left pelvic nodule is concerning for disease progression despite interval decrease in size of a nodule in the left paracolic gutter and stability of other nodules. 6. No evidence of metastatic disease in the chest. 7. Stable, benign left adrenal adenoma. 8. Ventral hernia mesh repair with a small component of recurrent hernia inferiorly, containing a single nonobstructed loop of small bowel. 9. Hepatic steatosis. 10. Aortic Atherosclerosis (ICD10-I70.0).   12/23/2019 Tumor Marker   Patient's tumor was tested  for the following markers: Inhibin B Results of the tumor marker test revealed 94.9   01/25/2020 Tumor Marker   Patient's tumor was tested for the following markers: Inhibin B Results of the tumor marker test revealed 116.7    RISK FACTORS:  First live birth at age 41.  Ovaries intact: no; see history noted above Hysterectomy: yes; see history noted above  Menopausal status: postmenopausal.  Colonoscopy: yes; 10 years ago. Mammogram within the last year: no; most recent  mammogram two years ago per patient Number of breast biopsies: 0. Up to date with pelvic exams: yes.  Past Medical History:  Diagnosis Date  . Anxiety   . Arthritis   . Cervical syndrome   . CKD (chronic kidney disease), stage III ([place])   . Constipation   . COVID-19 03/26/2019  . Family history of colon cancer 02/15/2020  . Fatty liver   . GERD (gastroesophageal reflux disease)   . Granulosa cell tumor of ovary   . History of hiatal hernia   . Joint pain   . Obesity   . Osteoarthritis   . Ovarian cancer ([place])   . PONV (postoperative nausea and vomiting)    history of n/v,  past surgeries no n/v  . Sleep apnea     Past Surgical History:  Procedure Laterality Date  . ABDOMINAL HYSTERECTOMY     TAH/BSO  . APPENDECTOMY    . EXPLORATORY LAPAROTOMY     for bowel obstruction  . left toe surgery Left   . Secondary tumor debulking  2002  . SHOULDER SURGERY     2017 left  . Tumor debulking  2010  . VENTRAL HERNIA REPAIR      Social History   Socioeconomic History  . Marital status: Married    Spouse name: Chiyeko Ferre  . Number of children: 2  . Years of education: Not on file  . Highest education level: Not on file  Occupational History  . Occupation: Therapist, sports  Tobacco Use  . Smoking status: Never Smoker  . Smokeless tobacco: Never Used  Vaping Use  . Vaping Use: Never used  Substance and Sexual Activity  . Alcohol use: Yes    Comment: One 8oz glass, socially  . Drug use: No  . Sexual activity: Not Currently  Other Topics Concern  . Not on file  Social History Narrative  . Not on file   Social Determinants of Health   Financial Resource Strain:   . Difficulty of Paying Living Expenses: Not on file  Food Insecurity:   . Worried About Charity fundraiser in the Last Year: Not on file  . Ran Out of Food in the Last Year: Not on file  Transportation Needs:   . Lack of Transportation (Medical): Not on file  . Lack of Transportation (Non-Medical): Not on file    Physical Activity:   . Days of Exercise per Week: Not on file  . Minutes of Exercise per Session: Not on file  Stress:   . Feeling of Stress : Not on file  Social Connections:   . Frequency of Communication with Friends and Family: Not on file  . Frequency of Social Gatherings with Friends and Family: Not on file  . Attends Religious Services: Not on file  . Active Member of Clubs or Organizations: Not on file  . Attends Archivist Meetings: Not on file  . Marital Status: Not on file     FAMILY HISTORY:  We obtained a detailed,  4-generation family history.  Significant diagnoses are listed below: Family History  Problem Relation Age of Onset  . Basal cell carcinoma Mother        34s  . Colon cancer Father 11  . Colon cancer Paternal Uncle        dx 63s  . Colon cancer Cousin        maternal cousin; dx 48s     [name] has two daughters, ages 81 and 4, without a history of cancer.  [name] has three brothers and one sister, who are all cancer free.  [name] mother is 49 years old and was diagnosed with basal cell carcinoma in her 48s.  [name] has a maternal cousin who passed away from colon cancer in his 45s.  [name] maternal grandfather was diagnosed with an unknown type of cancer in his 40s, possibly skin cancer.  No other maternal family history of cancer was reported.  [name] father was diagnosed with colon cancer at age 59 and passed away at 101.  [name] had a paternal uncle diagnosed with colon cancer in his 56s.  No other paternal family history of cancer was reported.   [name] is unaware of previous family history of genetic testing for hereditary cancer risks. Patient's maternal ancestors are of [place] descent, and paternal ancestors are of [name] descent. There is no reported Ashkenazi Jewish ancestry. There is no known consanguinity.  GENETIC COUNSELING ASSESSMENT: [name] is a 64 y.o. female with a personal and family history of cancer which  is somewhat suggestive of a hereditary cancer syndrome and predisposition to cancer given related diagnoses in multiple generations of the family. We, therefore, discussed and recommended the following at today's visit.   DISCUSSION: We discussed that 5 - 10% of cancer is hereditary, with most cases of hereditary colon cancer associated with Lynch syndrome.  We discussed that Lynch syndrome increases risk for colon, endometrial, and other cancers, such as epithelial ovarian cancer.  There are other genes that can be associated with colon cancer and sex cord stromal tumors. Type of cancer risk and level of risk are gene-specific.  We discussed that testing is beneficial for several reasons including knowing how to follow individuals for cancer risks understanding if other family members could be at risk for cancer and allowing them to undergo genetic testing.   We reviewed the characteristics, features and inheritance patterns of hereditary cancer syndromes. We also discussed genetic testing, including the appropriate family members to test, the process of testing, insurance coverage and turn-around-time for results. We discussed the implications of a negative, positive, carrier and/or variant of uncertain significant result. We recommended [name] pursue genetic testing for a panel that includes genes associated with sex cord stromal tumors and colon cancer.   [name]  was offered a common hereditary cancer panel (48 genes) and an expanded pan-cancer panel (85 genes). [name] was informed of the benefits and limitations of each panel, including that expanded pan-cancer panels contain several preliminary evidence genes that do not have clear management guidelines at this point in time.  We also discussed that as the number of genes included on a panel increases, the chances of variants of uncertain significance increases.  After considering the benefits and limitations of each gene panel, [name]  elected  to have an expanded pan-cancer panel through [facility].  The Multi-Cancer Panel offered by [facility] includes sequencing and/or deletion duplication testing of the following 85 genes: AIP,  ALK, APC, ATM, AXIN2,BAP1,  BARD1, BLM, BMPR1A, BRCA1, BRCA2, BRIP1, CASR, CDC73, CDH1, CDK4, CDKN1B, CDKN1C, CDKN2A (p14ARF), CDKN2A (p16INK4a), CEBPA, CHEK2, CTNNA1, DICER1, DIS3L2, EGFR (c.2369C>T, p.Thr790Met variant only), EPCAM (Deletion/duplication testing only), FH, FLCN, GATA2, GPC3, GREM1 (Promoter region deletion/duplication testing only), HOXB13 (c.251G>A, p.Gly84Glu), HRAS, KIT, MAX, MEN1, MET, MITF (c.952G>A, p.Glu318Lys variant only), MLH1, MSH2, MSH3, MSH6, MUTYH, NBN, NF1, NF2, NTHL1, PALB2, PDGFRA, PHOX2B, PMS2, POLD1, POLE, POT1, PRKAR1A, PTCH1, PTEN, RAD50, RAD51C, RAD51D, RB1, RECQL4, RET, RNF43, RUNX1, SDHAF2, SDHA (sequence changes only), SDHB, SDHC, SDHD, SMAD4, SMARCA4, SMARCB1, SMARCE1, STK11, SUFU, TERC, TERT, TMEM127, TP53, TSC1, TSC2, VHL, WRN and WT1.   Based on Ms. Gladman's personal and family history of cancer, she meets medical criteria for genetic testing. Despite that she meets criteria, she may still have an out of pocket cost. We discussed that if her out of pocket cost for testing is over $100, the laboratory will call and confirm whether she wants to proceed with testing.  If the out of pocket cost of testing is less than $100 she will be billed by the genetic testing laboratory.   PLAN: After considering the risks, benefits, and limitations, Ms. Qazi provided informed consent to pursue genetic testing and the blood sample was sent to [facility] for analysis of the Multi-Cancer Panel. Results should be available within approximately 3 weeks' time, at which point they will be disclosed by telephone to Ms. Tryon, as will any additional recommendations warranted by these results. Ms. Savard will receive a summary of her genetic counseling visit and a copy of her results once available.  This information will also be available in Epic.   Lastly, we encouraged Ms. Obarr to remain in contact with cancer genetics annually so that we can continuously update the family history and inform her of any changes in cancer genetics and testing that may be of benefit for this family.   Ms. Dimario questions were answered to her satisfaction today. Our contact information was provided should additional questions or concerns arise. Thank you for the referral and allowing [name] to share in the care of your patient.   Mikea Quadros M. Joette Catching, [place], [facility] Certified [email or web address] [email or web address] (P) [phone]  The patient was seen for a total of 20 minutes in face-to-face genetic counseling.  This patient was discussed with Drs. Magrinat, Lindi Adie and/or Burr Medico who agrees with the above.    _______________________________________________________________________ For Office Staff:  Number of people involved in session: 1 Was an Intern/ student involved with case: no

## 2020-02-17 DIAGNOSIS — H2513 Age-related nuclear cataract, bilateral: Secondary | ICD-10-CM | POA: Diagnosis not present

## 2020-02-17 DIAGNOSIS — H524 Presbyopia: Secondary | ICD-10-CM | POA: Diagnosis not present

## 2020-02-17 DIAGNOSIS — H35372 Puckering of macula, left eye: Secondary | ICD-10-CM | POA: Diagnosis not present

## 2020-02-17 DIAGNOSIS — H25013 Cortical age-related cataract, bilateral: Secondary | ICD-10-CM | POA: Diagnosis not present

## 2020-02-21 ENCOUNTER — Telehealth: Payer: Self-pay | Admitting: Genetic Counselor

## 2020-02-21 NOTE — Telephone Encounter (Signed)
Revealed negative genetic testing and VUS in HOXB13.  Discussed that we do not know why she has cancer or why there is cancer in the family. It could be sporadic, due to a different gene that we are not testing, or maybe our current technology may not be able to pick something up.  It will be important for her to keep in contact with genetics to keep up with whether additional testing may be needed.

## 2020-02-23 ENCOUNTER — Ambulatory Visit: Payer: Self-pay | Admitting: Genetic Counselor

## 2020-02-23 ENCOUNTER — Encounter: Payer: Self-pay | Admitting: Genetic Counselor

## 2020-02-23 DIAGNOSIS — C569 Malignant neoplasm of unspecified ovary: Secondary | ICD-10-CM

## 2020-02-23 DIAGNOSIS — Z1379 Encounter for other screening for genetic and chromosomal anomalies: Secondary | ICD-10-CM

## 2020-02-23 DIAGNOSIS — Z800 Family history of malignant neoplasm of digestive organs: Secondary | ICD-10-CM

## 2020-02-23 NOTE — Progress Notes (Signed)
HPI:  Ms. Marich was previously seen in the [facility] clinic due to a personal and family history of cancer and concerns regarding a hereditary predisposition to cancer. Please refer to our prior cancer genetics clinic note for more information regarding our discussion, assessment and recommendations, at the time. Ms. Mamaril recent genetic test results were disclosed to her, as were recommendations warranted by these results. These results and recommendations are discussed in more detail below.  CANCER HISTORY:  Oncology History Overview Note  2018 - Core biopsy for ER/PR and Foundation One testing performed. Unfortunately, no sufficient tissue for Foundation One testing. ER was 50% and PR 90%. 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     - 09/2000 Chemotherapy   6 cycles of IP cisplatin and etoposide    02/2009 Relapse/Recurrence   CT showed increased size of nodules in pelvis   2010 Surgery   Exlap with section of tumor nodules near cecum and left pelvic sidewall. Tumor: ER negative, PR positive    Treatment Plan Change   Alternated 2 week courses of Megace and Tamoxifen - ended 03/2012   03/2012 PET scan   CT - progressive disease   2013 Treatment Plan Change   Letrozole   01/2016 Imaging   MRI showed progressive disease   2017 Treatment Plan Change   Two  weeks of alternating Tamoxifen 43m daily and then Megace 474mTID   08/2016 Imaging   MRI showed progressive disease with peritoneal implants near liver, in pelvis   01/2017 Treatment Plan Change   Lupron 11.25 q 3 months   11/2017 Imaging   Overall mixed response.   Mixed cystic/solid lesions in the left pelvis are mildly improved.   Cystic peritoneal disease, including the dominant lesion along the posterior right hepatic lobe, is mildly progressed.   Subcapsular lesion along the posterior right hepatic lobe is unchanged.   03/2018 Imaging   MRI A/P: Mixed response of individual peritoneal metastases in the pelvis, as described above. Overall, there has been no significant change in bulk of disease.   Stable cystic peritoneal metastatic disease along the capsular surfaces of the liver and spleen.   No new sites of metastatic disease identified within the abdomen or pelvis.   08/2018 Imaging   MRI A/P: Status post hysterectomy and bilateral salpingo-oophorectomy.   Mixed cystic/solid peritoneal implants in the abdomen/pelvis, as above. Dominant cystic implant along the posterior liver surface is mildly increased. Remaining lesions are overall grossly unchanged.   No new lesions are identified.   01/28/2019 Imaging   Mri A/P: 1. Relatively similar appearance of peritoneal metastasis. A posterior right hepatic capsular based lesion is similar to minimally decreased in size. Left pelvic implants are primarily similar with possible enlargement of an anterior high left pelvic cystic implant. No new disease identified. 2.  Aortic Atherosclerosis (ICD10-I70.0). 3. Hepatic steatosis. 4. Left adrenal adenoma.   06/02/2019 Imaging   MRI 1. Potential slight enlargement of dominant  cystic area and solid component, associated with rind like signal variation on T2 along the inferior right hepatic margin, also potentially slightly increased. Findings may still be within the realm of  measurement and technical variation. Close attention on follow-up. 2. Subtle cystic changes along the cephalad margin of the spleen are difficult to see on previous imaging, perhaps new compared with prior imaging studies. 3. Pelvic implants and left lower quadrant lesion with similar size, of the area along the left iliac vasculature may be slightly larger than on the prior study. 4. Signs of extensive retroperitoneal and pelvic lymphadenectomy. 5. Hepatic steatosis. 6. 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     06/08/2019 Cancer Staging   Staging form: [place], AJCC 7th Edition - Clinical: Stage IIIC (rT2, N1, M0) - Signed by Heath Lark, MD on 06/08/2019   06/10/2019 Imaging   1. 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 These findings are slightly worsened in comparison to a remote prior CT examination dated 10/04/2014.   2.  No evidence of metastatic disease in the chest.   3. Status post hysterectomy, pelvic and retroperitoneal lymph node dissection, and ventral hernia mesh repair.   4.  Hepatic steatosis.   5.  Aortic Atherosclerosis (ICD10-I70.0).   06/24/2019 - 09/02/2019 Chemotherapy   The patient had carboplatin for chemotherapy treatment.     09/02/2019 Tumor Marker   Patient's tumor was tested for the following markers: Inhibin B Results of the tumor marker test revealed 94   09/23/2019 Imaging   1. Interval progression of the soft tissue lesions in the anterior left pelvis, likely peritoneal implants, compatible with disease progression. Remaining sites of apparent disease along the liver capsule and posterior spleen are stable 2. Stable 2 cm left adrenal adenoma. 3. Hepatic steatosis. 4. Right-side predominant colonic diverticulosis without  diverticulitis. 5. Aortic Atherosclerosis (ICD10-I70.0).   12/23/2019 Imaging   1. Slight interval increase in size of a mixed solid and cystic nodule in the left hemipelvis measuring 3.0 x 2.9 cm, previously 2.7 x 2.1 cm. 2. Interval decrease in size of a nodule in the left paracolic gutter measuring 1.0 x 0.8 cm, previously 2.1 x 1.6 cm. 3. Stable peritoneal nodules in the hepatorenal recess and at the inferior tip of the spleen. 4. Unchanged nodule or lymph node overlying the left external iliac artery. 5. Enlargement of dominant left pelvic nodule is concerning for disease progression despite interval decrease in size of a nodule in the left paracolic gutter and stability of other nodules. 6. No evidence of metastatic disease in the chest. 7. Stable, benign left adrenal adenoma. 8. Ventral hernia mesh repair with a small component of recurrent hernia inferiorly, containing a single nonobstructed loop of small bowel. 9. Hepatic steatosis. 10. Aortic Atherosclerosis (ICD10-I70.0).   12/23/2019 Tumor Marker   Patient's tumor was tested for the following markers: Inhibin B Results of the tumor marker test revealed 94.9   01/25/2020 Tumor Marker   Patient's tumor was tested for the following markers: Inhibin B Results of the tumor marker test revealed 116.7   02/20/2020 Genetic Testing   Negative genetic testing: no pathogenic variants detected in [facility] Multi-Cancer Panel.  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  The report date is February 20, 2020.   The Multi-Cancer Panel  offered by [facility] includes sequencing and/or deletion duplication testing of the following 85 genes: AIP, ALK, APC, ATM, AXIN2,BAP1,  BARD1, BLM, BMPR1A, BRCA1, BRCA2, BRIP1, CASR, CDC73, CDH1, CDK4, CDKN1B, CDKN1C, CDKN2A (p14ARF), CDKN2A (p16INK4a), CEBPA, CHEK2, CTNNA1, DICER1, DIS3L2, EGFR (c.2369C>T, p.Thr790Met variant only), EPCAM (Deletion/duplication testing only), FH, FLCN, GATA2,  GPC3, GREM1 (Promoter region deletion/duplication testing only), HOXB13 (c.251G>A, p.Gly84Glu), HRAS, KIT, MAX, MEN1, MET, MITF (c.952G>A, p.Glu318Lys variant only), MLH1, MSH2, MSH3, MSH6, MUTYH, NBN, NF1, NF2, NTHL1, PALB2, PDGFRA, PHOX2B, PMS2, POLD1, POLE, POT1, PRKAR1A, PTCH1, PTEN, RAD50, RAD51C, RAD51D, RB1, RECQL4, RET, RNF43, RUNX1, SDHAF2, SDHA (sequence changes only), SDHB, SDHC, SDHD, SMAD4, SMARCA4, SMARCB1, SMARCE1, STK11, SUFU, TERC, TERT, TMEM127, TP53, TSC1, TSC2, VHL, WRN and WT1.      FAMILY HISTORY:  We obtained a detailed, 4-generation family history.  Significant diagnoses are listed below: Family History  Problem Relation Age of Onset  . Basal cell carcinoma Mother        34s  . Colon cancer Father 19  . Colon cancer Paternal Uncle        dx 50s  . Colon cancer Cousin        maternal cousin; dx 31s      Ms. Zaugg has two daughters, ages 49 and 67, without a history of cancer.  Ms. Cockerill has three brothers and one sister, who are all cancer free.  Ms. Beazer mother is 82 years old and was diagnosed with basal cell carcinoma in her 51s.  Ms. [name] has a maternal cousin who passed away from colon cancer in his 82s.  Ms. Brummet maternal grandfather was diagnosed with an unknown type of cancer in his 67s, possibly skin cancer.  No other maternal family history of cancer was reported.  Ms. Jicha father was diagnosed with colon cancer at age 65 and passed away at 44.  Ms. Giacobbe had a paternal uncle diagnosed with colon cancer in his 79s.  No other paternal family history of cancer was reported.   Ms. Hirst is unaware of previous family history of genetic testing for hereditary cancer risks. Patient's maternal ancestors are of [place] descent, and paternal ancestors are of [name] descent. There is no reported Ashkenazi Jewish ancestry. There is no known consanguinity.  GENETIC TEST RESULTS: Genetic testing reported out on February 20, 2020.  The [facility] Multi-Cancer Panel no  pathogenic mutations. The Multi-Cancer Panel offered by [facility] includes sequencing and/or deletion duplication testing of the following 85 genes: AIP, ALK, APC, ATM, AXIN2,BAP1,  BARD1, BLM, BMPR1A, BRCA1, BRCA2, BRIP1, CASR, CDC73, CDH1, CDK4, CDKN1B, CDKN1C, CDKN2A (p14ARF), CDKN2A (p16INK4a), CEBPA, CHEK2, CTNNA1, DICER1, DIS3L2, EGFR (c.2369C>T, p.Thr790Met variant only), EPCAM (Deletion/duplication testing only), FH, FLCN, GATA2, GPC3, GREM1 (Promoter region deletion/duplication testing only), HOXB13, HRAS, KIT, MAX, MEN1, MET, MITF (c.952G>A, p.Glu318Lys variant only), MLH1, MSH2, MSH3, MSH6, MUTYH, NBN, NF1, NF2, NTHL1, PALB2, PDGFRA, PHOX2B, PMS2, POLD1, POLE, POT1, PRKAR1A, PTCH1, PTEN, RAD50, RAD51C, RAD51D, RB1, RECQL4, RET, RNF43, RUNX1, SDHAF2, SDHA (sequence changes only), SDHB, SDHC, SDHD, SMAD4, SMARCA4, SMARCB1, SMARCE1, STK11, SUFU, TERC, TERT, TMEM127, TP53, TSC1, TSC2, VHL, WRN and WT1.    The test report has been scanned into EPIC and is located under the Molecular Pathology section of the Results Review tab.  A portion of the result report is included below for reference.     We discussed with Ms. Manke that because current genetic testing is not perfect, it is possible there may be a gene mutation in one of these genes that current  testing cannot detect, but that chance is small.  We also discussed, that there could be another gene that has not yet been discovered, or that we have not yet tested, that is responsible for the cancer diagnoses in the family. It is also possible there is a hereditary cause for the cancer in the family that Ms. Fossum did not inherit and therefore was not identified in her testing.  Therefore, it is important to remain in touch with cancer genetics in the future so that we can continue to offer Ms. Cashen the most up to date genetic testing.   Genetic testing did identify a variant of uncertain significance (VUS) was identified in the HOXB13 gene called  c.649C>T (p.Arg217Cys).  At this time, it is unknown if this variant is associated with increased cancer risk or if this is a normal finding, but most variants such as this get reclassified to being inconsequential. It should not be used to make medical management decisions. With time, we suspect the lab will determine the significance of this variant, if any. If we do learn more about it, we will try to contact Ms. Odle to discuss it further. However, it is important to stay in touch with [name] periodically and keep the address and phone number up to date.  ADDITIONAL GENETIC TESTING: We discussed with Ms. Charbonnet that her genetic testing was fairly extensive.  If there are genes identified to increase cancer risk that can be analyzed in the future, we would be happy to discuss and coordinate this testing at that time.    CANCER SCREENING RECOMMENDATIONS: Ms. Ackley test result is considered negative (normal).  This means that we have not identified a hereditary cause for her personal and family history of cancer at this time. Most cancers happen by chance and this negative test suggests that her cancer may fall into this category.    While reassuring, this does not definitively rule out a hereditary predisposition to cancer. It is still possible that there could be genetic mutations that are undetectable by current technology. There could be genetic mutations in genes that have not been tested or identified to increase cancer risk.  Therefore, it is recommended she continue to follow the cancer management and screening guidelines provided by her oncology and primary healthcare provider.   An individual's cancer risk and medical management are not determined by genetic test results alone. Overall cancer risk assessment incorporates additional factors, including personal medical history, family history, and any available genetic information that may result in a personalized plan for cancer prevention and  surveillance  RECOMMENDATIONS FOR FAMILY MEMBERS:  Individuals in this family might be at some increased risk of developing cancer, over the general population risk, simply due to the family history of cancer.  We recommended women in this family have a yearly mammogram beginning at age 56, or 48 years younger than the earliest onset of cancer, an annual clinical breast exam, and perform monthly breast self-exams. Women in this family should also have a gynecological exam as recommended by their primary provider. All family members should be referred for colonoscopy starting at age 81.  It is also possible there is a hereditary cause for the cancer in Ms. Vandivier's family that she did not inherit and therefore was not identified in her.  Based on Ms. Lybarger's family history, we recommended first degree relatives of the maternal cousin who was diagnosed with colon cancer in their 65s have genetic counseling and testing. Ms. Shone will let  us know if we can be of any assistance in coordinating genetic counseling and/or testing for this family member.   FOLLOW-UP: Lastly, we discussed with Ms. Bromwell that cancer genetics is a rapidly advancing field and it is possible that new genetic tests will be appropriate for her and/or her family members in the future. We encouraged her to remain in contact with cancer genetics on an annual basis so we can update her personal and family histories and let her know of advances in cancer genetics that may benefit this family.   Our contact number was provided. Ms. Wille questions were answered to her satisfaction, and she knows she is welcome to call us at anytime with additional questions or concerns.   Virna Livengood M. Joette Catching, [place], [facility] Certified Film/video [email or web address] (P) [phone]

## 2020-02-24 ENCOUNTER — Encounter: Payer: Self-pay | Admitting: Hematology and Oncology

## 2020-02-29 DIAGNOSIS — M25572 Pain in left ankle and joints of left foot: Secondary | ICD-10-CM | POA: Diagnosis not present

## 2020-02-29 DIAGNOSIS — M1712 Unilateral primary osteoarthritis, left knee: Secondary | ICD-10-CM | POA: Diagnosis not present

## 2020-03-01 ENCOUNTER — Encounter: Payer: Self-pay | Admitting: Hematology and Oncology

## 2020-03-01 ENCOUNTER — Inpatient Hospital Stay: Payer: 59 | Attending: Hematology

## 2020-03-01 ENCOUNTER — Other Ambulatory Visit: Payer: Self-pay

## 2020-03-01 ENCOUNTER — Encounter (INDEPENDENT_AMBULATORY_CARE_PROVIDER_SITE_OTHER): Payer: Self-pay | Admitting: Family Medicine

## 2020-03-01 ENCOUNTER — Other Ambulatory Visit (INDEPENDENT_AMBULATORY_CARE_PROVIDER_SITE_OTHER): Payer: Self-pay | Admitting: Family Medicine

## 2020-03-01 ENCOUNTER — Inpatient Hospital Stay (HOSPITAL_BASED_OUTPATIENT_CLINIC_OR_DEPARTMENT_OTHER): Payer: 59 | Admitting: Hematology and Oncology

## 2020-03-01 ENCOUNTER — Inpatient Hospital Stay: Payer: 59

## 2020-03-01 ENCOUNTER — Ambulatory Visit (INDEPENDENT_AMBULATORY_CARE_PROVIDER_SITE_OTHER): Payer: 59 | Admitting: Family Medicine

## 2020-03-01 VITALS — BP 145/74 | HR 62 | Temp 97.7°F | Ht 66.0 in | Wt 193.0 lb

## 2020-03-01 VITALS — BP 122/57 | HR 61

## 2020-03-01 DIAGNOSIS — N183 Chronic kidney disease, stage 3 unspecified: Secondary | ICD-10-CM | POA: Diagnosis not present

## 2020-03-01 DIAGNOSIS — E669 Obesity, unspecified: Secondary | ICD-10-CM

## 2020-03-01 DIAGNOSIS — Z7189 Other specified counseling: Secondary | ICD-10-CM

## 2020-03-01 DIAGNOSIS — Z9189 Other specified personal risk factors, not elsewhere classified: Secondary | ICD-10-CM

## 2020-03-01 DIAGNOSIS — Z6831 Body mass index (BMI) 31.0-31.9, adult: Secondary | ICD-10-CM

## 2020-03-01 DIAGNOSIS — C569 Malignant neoplasm of unspecified ovary: Secondary | ICD-10-CM | POA: Insufficient documentation

## 2020-03-01 DIAGNOSIS — F3289 Other specified depressive episodes: Secondary | ICD-10-CM | POA: Diagnosis not present

## 2020-03-01 DIAGNOSIS — D3502 Benign neoplasm of left adrenal gland: Secondary | ICD-10-CM | POA: Insufficient documentation

## 2020-03-01 DIAGNOSIS — Z5112 Encounter for antineoplastic immunotherapy: Secondary | ICD-10-CM | POA: Insufficient documentation

## 2020-03-01 DIAGNOSIS — E559 Vitamin D deficiency, unspecified: Secondary | ICD-10-CM | POA: Diagnosis not present

## 2020-03-01 DIAGNOSIS — D72829 Elevated white blood cell count, unspecified: Secondary | ICD-10-CM | POA: Insufficient documentation

## 2020-03-01 DIAGNOSIS — D72825 Bandemia: Secondary | ICD-10-CM

## 2020-03-01 LAB — COMPREHENSIVE METABOLIC PANEL
ALT: 19 U/L (ref 0–44)
AST: 16 U/L (ref 15–41)
Albumin: 3.8 g/dL (ref 3.5–5.0)
Alkaline Phosphatase: 62 U/L (ref 38–126)
Anion gap: 10 (ref 5–15)
BUN: 27 mg/dL — ABNORMAL HIGH (ref 8–23)
CO2: 20 mmol/L — ABNORMAL LOW (ref 22–32)
Calcium: 9.6 mg/dL (ref 8.9–10.3)
Chloride: 107 mmol/L (ref 98–111)
Creatinine, Ser: 1.19 mg/dL — ABNORMAL HIGH (ref 0.44–1.00)
GFR, Estimated: 51 mL/min — ABNORMAL LOW (ref >60)
Glucose, Bld: 114 mg/dL — ABNORMAL HIGH (ref 70–99)
Potassium: 4.5 mmol/L (ref 3.5–5.1)
Sodium: 137 mmol/L (ref 135–145)
Total Bilirubin: 0.5 mg/dL (ref 0.3–1.2)
Total Protein: 7.3 g/dL (ref 6.5–8.1)

## 2020-03-01 LAB — CBC WITH DIFFERENTIAL (CANCER CENTER ONLY)
Abs Immature Granulocytes: 0.12 10*3/uL — ABNORMAL HIGH (ref 0.00–0.07)
Basophils Absolute: 0 10*3/uL (ref 0.0–0.1)
Basophils Relative: 0 %
Eosinophils Absolute: 0 10*3/uL (ref 0.0–0.5)
Eosinophils Relative: 0 %
HCT: 39.9 % (ref 36.0–46.0)
Hemoglobin: 13.7 g/dL (ref 12.0–15.0)
Immature Granulocytes: 1 %
Lymphocytes Relative: 6 %
Lymphs Abs: 1.2 10*3/uL (ref 0.7–4.0)
MCH: 31.1 pg (ref 26.0–34.0)
MCHC: 34.3 g/dL (ref 30.0–36.0)
MCV: 90.7 fL (ref 80.0–100.0)
Monocytes Absolute: 1.1 10*3/uL — ABNORMAL HIGH (ref 0.1–1.0)
Monocytes Relative: 6 %
Neutro Abs: 17 10*3/uL — ABNORMAL HIGH (ref 1.7–7.7)
Neutrophils Relative %: 87 %
Platelet Count: 295 10*3/uL (ref 150–400)
RBC: 4.4 MIL/uL (ref 3.87–5.11)
RDW: 11.9 % (ref 11.5–15.5)
WBC Count: 19.5 10*3/uL — ABNORMAL HIGH (ref 4.0–10.5)
nRBC: 0 % (ref 0.0–0.2)

## 2020-03-01 LAB — TOTAL PROTEIN, URINE DIPSTICK: Protein, ur: NEGATIVE mg/dL

## 2020-03-01 MED ORDER — SODIUM CHLORIDE 0.9 % IV SOLN
15.0000 mg/kg | INTRAVENOUS | Status: DC
  Administered 2020-03-01: 1400 mg via INTRAVENOUS
  Filled 2020-03-01: qty 48

## 2020-03-01 MED ORDER — VITAMIN D (ERGOCALCIFEROL) 1.25 MG (50000 UNIT) PO CAPS: 50000.0000 [IU] | ORAL_CAPSULE | ORAL | 0 refills | Status: DC

## 2020-03-01 MED ORDER — BUPROPION HCL ER (SR) 200 MG PO TB12: 200.0000 mg | ORAL_TABLET | Freq: Every day | ORAL | 0 refills | Status: DC

## 2020-03-01 MED ORDER — SODIUM CHLORIDE 0.9 % IV SOLN
Freq: Once | INTRAVENOUS | Status: AC
  Filled 2020-03-01: qty 250

## 2020-03-01 MED FILL — VIT D2 1.25 MG (50,000 UNIT: 1.25 MG | 56 days supply | Qty: 4 | Fill #0

## 2020-03-01 MED FILL — BUPROPION HCL SR 200 MG TAB: 200 | 30 days supply | Qty: 30 | Fill #0

## 2020-03-01 NOTE — Assessment & Plan Note (Signed)
Overall, she tolerated bevacizumab well Her blood pressure control is satisfactory All her dental issues has resolved We will proceed with treatment as scheduled I recommend repeating CT imaging after cycle 4 therapy

## 2020-03-01 NOTE — Assessment & Plan Note (Addendum)
This is likely due to corticosteroid injection to her knee recently Observe only for now She has no localizing signs to suggest infection

## 2020-03-01 NOTE — Progress Notes (Signed)
Chief Complaint:   OBESITY [name] is here to discuss her progress with her obesity treatment plan along with follow-up of her obesity related diagnoses. [name] is on the Category 2 Plan and states she is following her eating plan approximately 70% of the time. [name] states she is walking for 30 minutes 1-3 times per week, and doing floor exercises for 20 minutes 2 times per week.  Today's visit was #: 55 Starting weight: 209 lbs Starting date: 04/29/2018 Today's weight: 193 lbs Today's date: 03/01/2020 Total lbs lost to date: 16 Total lbs lost since last in-office visit: 0  Interim History: [name] has been eating more splurge type foods in the past few weeks. She is going out for Thanksgiving dinner in [place]. [name] did well with Category 2 but she doesn't really like meat right now. She has no plans for the holidays.  Subjective:   1. Vitamin D deficiency [name] denies nausea, vomiting, or muscle weakness, but notes fatigue. She is on prescription Vit D.  2. Other depression, with emotional eating [name] denies suicidal or homicidal ideas. She is on Wellbutrin and Lexapro.  3. At risk for deficient intake of food The patient is at a higher than average risk of deficient intake of food secondary to cancer treatment.  Assessment/Plan:   1. Vitamin D deficiency Low Vitamin D level contributes to fatigue and are associated with obesity, breast, and colon cancer. We will refill prescription Vitamin D for 2 months. [name] will follow-up for routine testing of Vitamin D, at least 2-3 times per year to avoid over-replacement.  - Vitamin D, Ergocalciferol, (DRISDOL) 1.25 MG (50000 UNIT) CAPS capsule; Take 1 capsule (50,000 Units total) by mouth every 14 (fourteen) days.  Dispense: 4 capsule; Refill: 0  2. Other depression, with emotional eating Behavior modification techniques were discussed today to help [name] deal with her emotional/non-hunger eating behaviors. We will refill Wellbutrin  SR for 1 month. Orders and follow up as documented in patient record.   - buPROPion (WELLBUTRIN SR) 200 MG 12 hr tablet; Take 1 tablet (200 mg total) by mouth daily.  Dispense: 30 tablet; Refill: 0  3. At risk for deficient intake of food [name] was given approximately 15 minutes of deficit intake of food prevention counseling today. [name] is at risk for eating too few calories based on current food recall. She was encouraged to focus on meeting caloric and protein goals according to her recommended meal plan.   4. Class 1 obesity with serious comorbidity and body mass index (BMI) of 31.0 to 31.9 in adult, unspecified obesity type [name] is currently in the action stage of change. As such, her goal is to continue with weight loss efforts. She has agreed to the [employer].   Exercise goals: As is.  Behavioral modification strategies: increasing lean protein intake, meal planning and cooking strategies, keeping healthy foods in the home and planning for success.  [name] has agreed to follow-up with our clinic in 3 weeks. She was informed of the importance of frequent follow-up visits to maximize her success with intensive lifestyle modifications for her multiple health conditions.   Objective:   Blood pressure (!) 145/74, pulse 62, temperature 97.7 F (36.5 C), temperature source Oral, height 5\' 6"  (1.676 m), weight 193 lb (87.5 kg), SpO2 96 %. Body mass index is 31.15 kg/m.  General: Cooperative, alert, well developed, in no acute distress. HEENT: Conjunctivae and lids unremarkable. Cardiovascular: Regular rhythm.  Lungs: Normal work of breathing. Neurologic: No  focal deficits.   Lab Results  Component Value Date   CREATININE 1.19 (H) 03/01/2020   BUN 27 (H) 03/01/2020   NA 137 03/01/2020   K 4.5 03/01/2020   CL 107 03/01/2020   CO2 20 (L) 03/01/2020   Lab Results  Component Value Date   ALT 19 03/01/2020   AST 16 03/01/2020   ALKPHOS 62 03/01/2020   BILITOT 0.5  03/01/2020   Lab Results  Component Value Date   HGBA1C 5.2 11/30/2019   HGBA1C 5.3 05/31/2019   HGBA1C 5.2 11/19/2018   HGBA1C 5.5 04/29/2018   Lab Results  Component Value Date   INSULIN 23.8 11/30/2019   INSULIN 16.5 05/31/2019   INSULIN 17.6 11/19/2018   INSULIN 25.8 (H) 04/29/2018   Lab Results  Component Value Date   TSH 0.707 11/30/2019   Lab Results  Component Value Date   CHOL 224 (H) 11/30/2019   HDL 52 11/30/2019   LDLCALC 136 (H) 11/30/2019   TRIG 201 (H) 11/30/2019   Lab Results  Component Value Date   WBC 19.5 (H) 03/01/2020   HGB 13.7 03/01/2020   HCT 39.9 03/01/2020   MCV 90.7 03/01/2020   PLT 295 03/01/2020   No results found for: IRON, TIBC, FERRITIN  Attestation Statements:   Reviewed by clinician on day of visit: allergies, medications, problem list, medical history, surgical history, family history, social history, and previous encounter notes.   I, Trixie Dredge, am acting as transcriptionist for Coralie Common, MD.  I have reviewed the above documentation for accuracy and completeness, and I agree with the above. - Jinny Blossom, MD

## 2020-03-01 NOTE — Assessment & Plan Note (Signed)
We will monitor her creatinine carefully Her renal function is stable

## 2020-03-01 NOTE — Patient Instructions (Signed)
[facility] Discharge Instructions for Patients Receiving Chemotherapy  Today you received the following chemotherapy agents: bevacizumab  To help prevent nausea and vomiting after your treatment, we encourage you to take your nausea medication as directed.   If you develop nausea and vomiting that is not controlled by your nausea medication, call the clinic.   BELOW ARE SYMPTOMS THAT SHOULD BE REPORTED IMMEDIATELY:  *FEVER GREATER THAN 100.5 F  *CHILLS WITH OR WITHOUT FEVER  NAUSEA AND VOMITING THAT IS NOT CONTROLLED WITH YOUR NAUSEA MEDICATION  *UNUSUAL SHORTNESS OF BREATH  *UNUSUAL BRUISING OR BLEEDING  TENDERNESS IN MOUTH AND THROAT WITH OR WITHOUT PRESENCE OF ULCERS  *URINARY PROBLEMS  *BOWEL PROBLEMS  UNUSUAL RASH Items with * indicate a potential emergency and should be followed up as soon as possible.  Feel free to call the clinic should you have any questions or concerns. The clinic phone number is [phone].  Please show the CHEMO ALERT CARD at check-in to the Emergency Department and triage nurse.   

## 2020-03-01 NOTE — Progress Notes (Signed)
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   All questions were answered. The patient knows to call the clinic with any problems, questions or concerns. The total time spent in the appointment was 20 minutes encounter with patients including review of chart and various tests results, discussions about plan of care and coordination of care plan   Heath Lark, MD 03/01/2020 11:35 AM  INTERVAL HISTORY: Please see below for problem oriented charting. She is seen prior to cycle 3 of bevacizumab Her dental issues is resolved She denies recent abdominal pain, changes in bowel habits or nausea Blood pressure control at home is satisfactory She have no venous access issue recently SUMMARY OF ONCOLOGIC HISTORY: Oncology History Overview Note  2018 - Core biopsy for ER/PR and Foundation One testing performed. Unfortunately, no sufficient tissue for Foundation One testing. ER was 50% and PR 90%. 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     - 09/2000 Chemotherapy   6 cycles of IP cisplatin and etoposide    02/2009 Relapse/Recurrence   CT showed increased size of nodules in pelvis   2010 Surgery   Exlap with section of tumor nodules near cecum and left pelvic sidewall. Tumor: ER negative, PR positive    Treatment Plan Change   Alternated 2 week courses of Megace and Tamoxifen - ended 03/2012   03/2012 PET scan   CT - progressive disease   2013 Treatment Plan Change   Letrozole   01/2016 Imaging   MRI showed progressive disease   2017 Treatment Plan Change   Two weeks of alternating Tamoxifen 86m daily and then Megace 458mTID   08/2016 Imaging   MRI showed progressive disease with peritoneal implants near liver, in pelvis   01/2017 Treatment Plan Change   Lupron 11.25 q 3 months   11/2017 Imaging   Overall mixed response.   Mixed cystic/solid lesions in the left pelvis are mildly improved.   Cystic peritoneal disease, including the dominant lesion along the posterior right hepatic lobe, is mildly progressed.   Subcapsular lesion along the posterior right hepatic lobe is unchanged.   03/2018 Imaging   MRI A/P: Mixed response of individual peritoneal metastases in the pelvis, as described above. Overall, there has been no significant change in bulk of disease.   Stable cystic peritoneal  metastatic disease along the capsular surfaces of the liver and spleen.   No new sites of metastatic disease identified within the abdomen or pelvis.   08/2018 Imaging   MRI A/P: Status post hysterectomy and bilateral salpingo-oophorectomy.   Mixed cystic/solid peritoneal implants  in the abdomen/pelvis, as above. Dominant cystic implant along the posterior liver surface is mildly increased. Remaining lesions are overall grossly unchanged.   No new lesions are identified.   01/28/2019 Imaging   Mri A/P: 1. Relatively similar appearance of peritoneal metastasis. A posterior right hepatic capsular based lesion is similar to minimally decreased in size. Left pelvic implants are primarily similar with possible enlargement of an anterior high left pelvic cystic implant. No new disease identified. 2.  Aortic Atherosclerosis (ICD10-I70.0). 3. Hepatic steatosis. 4. Left adrenal adenoma.   06/02/2019 Imaging   MRI 1. Potential slight enlargement of dominant cystic area and solid component, associated with rind like signal variation on T2 along the inferior right hepatic margin, also potentially slightly increased. Findings may still be within the realm of measurement and technical variation. Close attention on follow-up. 2. Subtle cystic changes along the cephalad margin of the spleen are difficult to see on previous imaging, perhaps new compared with prior imaging studies. 3. Pelvic implants and left lower quadrant lesion with similar size, of the area along the left iliac vasculature may be slightly larger than on the prior study. 4. Signs of extensive retroperitoneal and pelvic lymphadenectomy. 5. Hepatic steatosis. 6. 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     06/08/2019 Cancer Staging   Staging form: [place], AJCC 7th Edition - Clinical: Stage IIIC (rT2, N1, M0) - Signed by Heath Lark, MD on 06/08/2019   06/10/2019 Imaging   1. 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 These findings are slightly worsened in comparison to a remote prior CT  examination dated 10/04/2014.   2.  No evidence of metastatic disease in the chest.   3. Status post hysterectomy, pelvic and retroperitoneal lymph node dissection, and ventral hernia mesh repair.   4.  Hepatic steatosis.   5.  Aortic Atherosclerosis (ICD10-I70.0).   06/24/2019 - 09/02/2019 Chemotherapy   The patient had carboplatin for chemotherapy treatment.     09/02/2019 Tumor Marker   Patient's tumor was tested for the following markers: Inhibin B Results of the tumor marker test revealed 94   09/23/2019 Imaging   1. Interval progression of the soft tissue lesions in the anterior left pelvis, likely peritoneal implants, compatible with disease progression. Remaining sites of apparent disease along the liver capsule and posterior spleen are stable 2. Stable 2 cm left adrenal adenoma. 3. Hepatic steatosis. 4. Right-side predominant colonic diverticulosis without diverticulitis. 5. Aortic Atherosclerosis (ICD10-I70.0).   12/23/2019 Imaging   1. Slight interval increase in size of a mixed solid and cystic nodule in the left hemipelvis measuring 3.0 x 2.9 cm, previously 2.7 x 2.1 cm. 2. Interval decrease in size of a nodule in the left paracolic gutter measuring 1.0 x 0.8 cm, previously 2.1 x 1.6 cm. 3. Stable peritoneal nodules in the hepatorenal recess and at the inferior tip of the spleen. 4. Unchanged nodule or lymph node overlying the left external iliac artery. 5. Enlargement of dominant left pelvic nodule is concerning for disease progression despite interval decrease  in size of a nodule in the left paracolic gutter and stability of other nodules. 6. No evidence of metastatic disease in the chest. 7. Stable, benign left adrenal adenoma. 8. Ventral hernia mesh repair with a small component of recurrent hernia inferiorly, containing a single nonobstructed loop of small bowel. 9. Hepatic steatosis. 10. Aortic Atherosclerosis (ICD10-I70.0).   12/23/2019 Tumor Marker   Patient's tumor  was tested for the following markers: Inhibin B Results of the tumor marker test revealed 94.9   01/25/2020 Tumor Marker   Patient's tumor was tested for the following markers: Inhibin B Results of the tumor marker test revealed 116.7   02/20/2020 Genetic Testing   Negative genetic testing: no pathogenic variants detected in [facility] Multi-Cancer Panel.  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  The report date is February 20, 2020.   The Multi-Cancer Panel offered by [facility] includes sequencing and/or deletion duplication testing of the following 85 genes: AIP, ALK, APC, ATM, AXIN2,BAP1,  BARD1, BLM, BMPR1A, BRCA1, BRCA2, BRIP1, CASR, CDC73, CDH1, CDK4, CDKN1B, CDKN1C, CDKN2A (p14ARF), CDKN2A (p16INK4a), CEBPA, CHEK2, CTNNA1, DICER1, DIS3L2, EGFR (c.2369C>T, p.Thr790Met variant only), EPCAM (Deletion/duplication testing only), FH, FLCN, GATA2, GPC3, GREM1 (Promoter region deletion/duplication testing only), HOXB13 (c.251G>A, p.Gly84Glu), HRAS, KIT, MAX, MEN1, MET, MITF (c.952G>A, p.Glu318Lys variant only), MLH1, MSH2, MSH3, MSH6, MUTYH, NBN, NF1, NF2, NTHL1, PALB2, PDGFRA, PHOX2B, PMS2, POLD1, POLE, POT1, PRKAR1A, PTCH1, PTEN, RAD50, RAD51C, RAD51D, RB1, RECQL4, RET, RNF43, RUNX1, SDHAF2, SDHA (sequence changes only), SDHB, SDHC, SDHD, SMAD4, SMARCA4, SMARCB1, SMARCE1, STK11, SUFU, TERC, TERT, TMEM127, TP53, TSC1, TSC2, VHL, WRN and WT1.      REVIEW OF SYSTEMS:   Constitutional: Denies fevers, chills or abnormal weight loss Eyes: Denies blurriness of vision Ears, nose, mouth, throat, and face: Denies mucositis or sore throat Respiratory: Denies cough, dyspnea or wheezes Cardiovascular: Denies palpitation, chest discomfort or lower extremity swelling Gastrointestinal:  Denies nausea, heartburn or change in bowel habits Skin: Denies abnormal skin rashes Lymphatics: Denies new lymphadenopathy or easy bruising Neurological:Denies numbness, tingling or new  weaknesses Behavioral/Psych: Mood is stable, no new changes  All other systems were reviewed with the patient and are negative.  I have reviewed the past medical history, past surgical history, social history and family history with the patient and they are unchanged from previous note.  ALLERGIES:  is allergic to codeine, oxycodone, sodium thiosalicylate, ciprofloxacin, codone [hydrocodone bitartrate], daypro [oxaprozin], levofloxacin, stadol [butorphanol tartrate], and sulfa antibiotics.  MEDICATIONS:  Current Outpatient Medications  Medication Sig Dispense Refill  . aspirin 325 MG tablet Take 325 mg by mouth daily.    . Bevacizumab (AVASTIN IV) Inject into the vein. Every 3 weeks    . Biotin 10 MG CAPS Take by mouth.    Marland Kitchen buPROPion (WELLBUTRIN SR) 200 MG 12 hr tablet Take 1 tablet (200 mg total) by mouth daily. 30 tablet 0  . Calcium Citrate 200 MG TABS Take by mouth.    . escitalopram (LEXAPRO) 20 MG tablet Take 10 mg by mouth daily.     . metFORMIN (GLUCOPHAGE) 1000 MG tablet 1/2 tab BID with meals 60 tablet 0  . mirabegron ER (MYRBETRIQ) 25 MG TB24 tablet Take 25 mg by mouth daily.    . Multiple Vitamin (MULTIVITAMIN) capsule Take 1 capsule by mouth daily.    Marland Kitchen omeprazole (PRILOSEC) 20 MG capsule Take 20 mg by mouth daily.     . Probiotic Product (ALIGN PO) Take 1 tablet by mouth daily.     . Vitamin  D, Ergocalciferol, (DRISDOL) 1.25 MG (50000 UNIT) CAPS capsule Take 1 capsule (50,000 Units total) by mouth every 14 (fourteen) days. 4 capsule 0   No current facility-administered medications for this visit.    PHYSICAL EXAMINATION: ECOG PERFORMANCE STATUS: 0 - Asymptomatic  Vitals:   03/01/20 1112  BP: (!) 145/74  Pulse: 62  Resp: 18  Temp: 97.7 F (36.5 C)  SpO2: 96%   Filed Weights   03/01/20 1112  Weight: 193 lb (87.5 kg)    GENERAL:alert, no distress and comfortable SKIN: skin color, texture, turgor are normal, no rashes or significant lesions EYES: normal,  Conjunctiva are pink and non-injected, sclera clear OROPHARYNX:no exudate, no erythema and lips, buccal mucosa, and tongue normal  NECK: supple, thyroid normal size, non-tender, without nodularity LYMPH:  no palpable lymphadenopathy in the cervical, axillary or inguinal LUNGS: clear to auscultation and percussion with normal breathing effort HEART: regular rate & rhythm and no murmurs and no lower extremity edema ABDOMEN:abdomen soft, non-tender and normal bowel sounds Musculoskeletal:no cyanosis of digits and no clubbing  NEURO: alert & oriented x 3 with fluent speech, no focal motor/sensory deficits  LABORATORY DATA:  I have reviewed the data as listed    Component Value Date/Time   NA 137 03/01/2020 0903   NA 139 11/30/2019 0913   NA 141 09/24/2013 1131   K 4.5 03/01/2020 0903   K 4.5 09/24/2013 1131   CL 107 03/01/2020 0903   CO2 20 (L) 03/01/2020 0903   CO2 24 09/24/2013 1131   GLUCOSE 114 (H) 03/01/2020 0903   GLUCOSE 90 09/24/2013 1131   BUN 27 (H) 03/01/2020 0903   BUN 32 (H) 11/30/2019 0913   BUN 21.2 09/24/2013 1131   CREATININE 1.19 (H) 03/01/2020 0903   CREATININE 1.18 (H) 12/21/2019 1259   CREATININE 1.1 09/24/2013 1131   CALCIUM 9.6 03/01/2020 0903   CALCIUM 9.5 09/24/2013 1131   PROT 7.3 03/01/2020 0903   PROT 7.0 11/30/2019 0913   PROT 6.8 09/24/2013 1131   ALBUMIN 3.8 03/01/2020 0903   ALBUMIN 4.3 11/30/2019 0913   ALBUMIN 3.9 09/24/2013 1131   AST 16 03/01/2020 0903   AST 18 12/21/2019 1259   AST 23 09/24/2013 1131   ALT 19 03/01/2020 0903   ALT 18 12/21/2019 1259   ALT 27 09/24/2013 1131   ALKPHOS 62 03/01/2020 0903   ALKPHOS 81 09/24/2013 1131   BILITOT 0.5 03/01/2020 0903   BILITOT 0.3 12/21/2019 1259   BILITOT 0.53 09/24/2013 1131   GFRNONAA 51 (L) 03/01/2020 0903   GFRNONAA 49 (L) 12/21/2019 1259   GFRAA 55 (L) 01/24/2020 1530   GFRAA 57 (L) 12/21/2019 1259    No results found for: SPEP, UPEP  Lab Results  Component Value Date   WBC  19.5 (H) 03/01/2020   NEUTROABS 17.0 (H) 03/01/2020   HGB 13.7 03/01/2020   HCT 39.9 03/01/2020   MCV 90.7 03/01/2020   PLT 295 03/01/2020      Chemistry      Component Value Date/Time   NA 137 03/01/2020 0903   NA 139 11/30/2019 0913   NA 141 09/24/2013 1131   K 4.5 03/01/2020 0903   K 4.5 09/24/2013 1131   CL 107 03/01/2020 0903   CO2 20 (L) 03/01/2020 0903   CO2 24 09/24/2013 1131   BUN 27 (H) 03/01/2020 0903   BUN 32 (H) 11/30/2019 0913   BUN 21.2 09/24/2013 1131   CREATININE 1.19 (H) 03/01/2020 0903   CREATININE 1.18 (H)  12/21/2019 1259   CREATININE 1.1 09/24/2013 1131      Component Value Date/Time   CALCIUM 9.6 03/01/2020 0903   CALCIUM 9.5 09/24/2013 1131   ALKPHOS 62 03/01/2020 0903   ALKPHOS 81 09/24/2013 1131   AST 16 03/01/2020 0903   AST 18 12/21/2019 1259   AST 23 09/24/2013 1131   ALT 19 03/01/2020 0903   ALT 18 12/21/2019 1259   ALT 27 09/24/2013 1131   BILITOT 0.5 03/01/2020 0903   BILITOT 0.3 12/21/2019 1259   BILITOT 0.53 09/24/2013 1131

## 2020-03-02 LAB — INHIBIN B: Inhibin B: 80.8 pg/mL — ABNORMAL HIGH (ref 0.0–16.9)

## 2020-03-03 ENCOUNTER — Telehealth: Payer: Self-pay | Admitting: Internal Medicine

## 2020-03-10 NOTE — Telephone Encounter (Signed)
Last seen 2019 for sleep apnea. Patient will need an OV for [name] to follow up with her OSA. lmtcb for pt to schedule appt.

## 2020-03-13 NOTE — Telephone Encounter (Signed)
lmtcb for pt.   Needs OV with CY for OSA.

## 2020-03-14 DIAGNOSIS — M1712 Unilateral primary osteoarthritis, left knee: Secondary | ICD-10-CM | POA: Diagnosis not present

## 2020-03-17 ENCOUNTER — Encounter: Payer: Self-pay | Admitting: Hematology and Oncology

## 2020-03-20 ENCOUNTER — Telehealth: Payer: Self-pay

## 2020-03-20 NOTE — Telephone Encounter (Signed)
She called regarding mychart message sent on Friday. Appts canceled for this week and sent a scheduling message to reschedule to 12/7 or 12/9.

## 2020-03-21 ENCOUNTER — Inpatient Hospital Stay: Payer: 59

## 2020-03-21 ENCOUNTER — Telehealth: Payer: Self-pay | Admitting: Hematology and Oncology

## 2020-03-21 ENCOUNTER — Inpatient Hospital Stay: Payer: 59 | Admitting: Hematology and Oncology

## 2020-03-21 DIAGNOSIS — M1712 Unilateral primary osteoarthritis, left knee: Secondary | ICD-10-CM | POA: Diagnosis not present

## 2020-03-21 NOTE — Telephone Encounter (Signed)
Lmtcb for pt.  Due to several unsuccessful attempts to reach pt the message will be closed per triage protocol.

## 2020-03-21 NOTE — Telephone Encounter (Signed)
Scheduled appt per 11/29 sch msg - pt is aware of apt date and time

## 2020-03-22 ENCOUNTER — Inpatient Hospital Stay: Payer: 59

## 2020-03-22 MED FILL — OMEPRAZOLE 20 MG CAP: 20 | 90 days supply | Qty: 90 | Fill #2

## 2020-03-28 DIAGNOSIS — M1712 Unilateral primary osteoarthritis, left knee: Secondary | ICD-10-CM | POA: Diagnosis not present

## 2020-03-28 MED ORDER — KETAMINE HCL 10 MG/ML IJ SOLN
INTRAMUSCULAR | Status: AC
  Filled 2020-03-28: qty 1

## 2020-03-28 MED ORDER — LIDOCAINE HCL (PF) 2 % IJ SOLN
INTRAMUSCULAR | Status: AC
  Filled 2020-03-28: qty 5

## 2020-03-28 MED ORDER — FENTANYL CITRATE (PF) 100 MCG/2ML IJ SOLN
INTRAMUSCULAR | Status: AC
  Filled 2020-03-28: qty 2

## 2020-03-28 MED ORDER — LABETALOL HCL 5 MG/ML IV SOLN
INTRAVENOUS | Status: AC
  Filled 2020-03-28: qty 4

## 2020-03-28 MED ORDER — ROCURONIUM BROMIDE 10 MG/ML (PF) SYRINGE
PREFILLED_SYRINGE | INTRAVENOUS | Status: AC
  Filled 2020-03-28: qty 10

## 2020-03-28 MED ORDER — MIDAZOLAM HCL 2 MG/2ML IJ SOLN
INTRAMUSCULAR | Status: AC
  Filled 2020-03-28: qty 2

## 2020-03-28 MED ORDER — ONDANSETRON HCL 4 MG/2ML IJ SOLN
INTRAMUSCULAR | Status: AC
  Filled 2020-03-28: qty 2

## 2020-03-28 MED ORDER — DEXAMETHASONE SODIUM PHOSPHATE 10 MG/ML IJ SOLN
INTRAMUSCULAR | Status: AC
  Filled 2020-03-28: qty 1

## 2020-03-28 MED ORDER — PROPOFOL 10 MG/ML IV BOLUS
INTRAVENOUS | Status: AC
  Filled 2020-03-28: qty 20

## 2020-03-29 ENCOUNTER — Ambulatory Visit (INDEPENDENT_AMBULATORY_CARE_PROVIDER_SITE_OTHER): Payer: 59 | Admitting: Family Medicine

## 2020-03-30 ENCOUNTER — Inpatient Hospital Stay: Payer: 59

## 2020-03-30 ENCOUNTER — Encounter: Payer: Self-pay | Admitting: Hematology and Oncology

## 2020-03-30 ENCOUNTER — Other Ambulatory Visit: Payer: Self-pay

## 2020-03-30 ENCOUNTER — Inpatient Hospital Stay: Payer: 59 | Attending: Hematology

## 2020-03-30 ENCOUNTER — Inpatient Hospital Stay (HOSPITAL_BASED_OUTPATIENT_CLINIC_OR_DEPARTMENT_OTHER): Payer: 59 | Admitting: Hematology and Oncology

## 2020-03-30 VITALS — BP 127/60

## 2020-03-30 VITALS — BP 117/74 | HR 60 | Temp 97.5°F | Resp 20 | Ht 66.0 in | Wt 195.0 lb

## 2020-03-30 DIAGNOSIS — C569 Malignant neoplasm of unspecified ovary: Secondary | ICD-10-CM

## 2020-03-30 DIAGNOSIS — N183 Chronic kidney disease, stage 3 unspecified: Secondary | ICD-10-CM | POA: Diagnosis not present

## 2020-03-30 DIAGNOSIS — Z5112 Encounter for antineoplastic immunotherapy: Secondary | ICD-10-CM | POA: Diagnosis not present

## 2020-03-30 DIAGNOSIS — C786 Secondary malignant neoplasm of retroperitoneum and peritoneum: Secondary | ICD-10-CM | POA: Insufficient documentation

## 2020-03-30 DIAGNOSIS — Z7189 Other specified counseling: Secondary | ICD-10-CM

## 2020-03-30 LAB — COMPREHENSIVE METABOLIC PANEL
ALT: 23 U/L (ref 0–44)
AST: 20 U/L (ref 15–41)
Albumin: 3.8 g/dL (ref 3.5–5.0)
Alkaline Phosphatase: 53 U/L (ref 38–126)
Anion gap: 9 (ref 5–15)
BUN: 33 mg/dL — ABNORMAL HIGH (ref 8–23)
CO2: 23 mmol/L (ref 22–32)
Calcium: 9.2 mg/dL (ref 8.9–10.3)
Chloride: 106 mmol/L (ref 98–111)
Creatinine, Ser: 1.34 mg/dL — ABNORMAL HIGH (ref 0.44–1.00)
GFR, Estimated: 45 mL/min — ABNORMAL LOW (ref >60)
Glucose, Bld: 94 mg/dL (ref 70–99)
Potassium: 4.8 mmol/L (ref 3.5–5.1)
Sodium: 138 mmol/L (ref 135–145)
Total Bilirubin: 0.4 mg/dL (ref 0.3–1.2)
Total Protein: 7.1 g/dL (ref 6.5–8.1)

## 2020-03-30 LAB — CBC WITH DIFFERENTIAL (CANCER CENTER ONLY)
Abs Immature Granulocytes: 0.01 10*3/uL (ref 0.00–0.07)
Basophils Absolute: 0.1 10*3/uL (ref 0.0–0.1)
Basophils Relative: 1 %
Eosinophils Absolute: 0.3 10*3/uL (ref 0.0–0.5)
Eosinophils Relative: 5 %
HCT: 40.7 % (ref 36.0–46.0)
Hemoglobin: 14.1 g/dL (ref 12.0–15.0)
Immature Granulocytes: 0 %
Lymphocytes Relative: 27 %
Lymphs Abs: 1.4 10*3/uL (ref 0.7–4.0)
MCH: 31.3 pg (ref 26.0–34.0)
MCHC: 34.6 g/dL (ref 30.0–36.0)
MCV: 90.2 fL (ref 80.0–100.0)
Monocytes Absolute: 0.5 10*3/uL (ref 0.1–1.0)
Monocytes Relative: 10 %
Neutro Abs: 2.9 10*3/uL (ref 1.7–7.7)
Neutrophils Relative %: 57 %
Platelet Count: 279 10*3/uL (ref 150–400)
RBC: 4.51 MIL/uL (ref 3.87–5.11)
RDW: 12.1 % (ref 11.5–15.5)
WBC Count: 5.1 10*3/uL (ref 4.0–10.5)
nRBC: 0 % (ref 0.0–0.2)

## 2020-03-30 LAB — TOTAL PROTEIN, URINE DIPSTICK: Protein, ur: NEGATIVE mg/dL

## 2020-03-30 MED ORDER — SODIUM CHLORIDE 0.9 % IV SOLN
INTRAVENOUS | Status: AC
  Filled 2020-03-30 (×2): qty 250

## 2020-03-30 MED ORDER — SODIUM CHLORIDE 0.9 % IV SOLN
1400.0000 mg | INTRAVENOUS | Status: DC
  Administered 2020-03-30: 1400 mg via INTRAVENOUS
  Filled 2020-03-30: qty 48

## 2020-03-30 MED ORDER — SODIUM CHLORIDE 0.9 % IV SOLN
INTRAVENOUS | Status: DC
  Filled 2020-03-30: qty 250

## 2020-03-30 NOTE — Assessment & Plan Note (Signed)
She admits she did not drink enough fluids Per patient request, I have added normal saline to be given today while she is receiving treatment

## 2020-03-30 NOTE — Assessment & Plan Note (Signed)
Overall, she tolerated bevacizumab well Her blood pressure control is satisfactory We will proceed with treatment as scheduled I recommend repeating CT imaging in 2 weeks

## 2020-03-30 NOTE — Patient Instructions (Signed)
Cancer Center Discharge Instructions for Patients Receiving Chemotherapy  Today you received the following chemotherapy agents: bevacizumab  To help prevent nausea and vomiting after your treatment, we encourage you to take your nausea medication as directed.   If you develop nausea and vomiting that is not controlled by your nausea medication, call the clinic.   BELOW ARE SYMPTOMS THAT SHOULD BE REPORTED IMMEDIATELY:  *FEVER GREATER THAN 100.5 F  *CHILLS WITH OR WITHOUT FEVER  NAUSEA AND VOMITING THAT IS NOT CONTROLLED WITH YOUR NAUSEA MEDICATION  *UNUSUAL SHORTNESS OF BREATH  *UNUSUAL BRUISING OR BLEEDING  TENDERNESS IN MOUTH AND THROAT WITH OR WITHOUT PRESENCE OF ULCERS  *URINARY PROBLEMS  *BOWEL PROBLEMS  UNUSUAL RASH Items with * indicate a potential emergency and should be followed up as soon as possible.  Feel free to call the clinic should you have any questions or concerns. The clinic phone number is [phone].  Please show the CHEMO ALERT CARD at check-in to the Emergency Department and triage nurse.   

## 2020-03-30 NOTE — Progress Notes (Signed)
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 CT ABDOMEN PELVIS W CONTRAST    Standing Status:   Future    Standing Expiration Date:   03/30/2021    Order Specific Question:   If indicated for the ordered procedure, I authorize the administration of contrast media per Radiology protocol    Answer:   Yes    Order Specific Question:   Preferred imaging location?    Answer:   [facility]    Order Specific Question:   Radiology Contrast Protocol - do NOT remove file path    Answer:   [email or web address]    All questions were answered. The patient knows to call the clinic with any problems, questions or concerns. The total time spent in the appointment was 20 minutes encounter with patients including review of chart and various tests results, discussions about plan of care and coordination of care plan   Heath Lark, MD 03/30/2020 11:00 AM  INTERVAL HISTORY: Please see below for problem oriented charting. She returns for further follow-up She is doing well No side effects of treatment so far Her blood pressure control at home is satisfactory Denies abdominal pain, bloating or changes in bowel habits She is noted to have elevated serum creatinine. She admits she did not drink enough fluid this morning  SUMMARY OF ONCOLOGIC HISTORY: Oncology History Overview Note  2018 - Core biopsy for ER/PR and  Foundation One testing performed. Unfortunately, no sufficient tissue for Foundation One testing. ER was 50% and PR 90%. 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     - 09/2000 Chemotherapy   6 cycles of IP cisplatin and etoposide    02/2009 Relapse/Recurrence   CT showed increased size of nodules in pelvis   2010 Surgery   Exlap with section of tumor nodules near cecum and left pelvic sidewall. Tumor: ER negative, PR positive    Treatment Plan Change   Alternated 2 week courses of Megace and Tamoxifen - ended 03/2012   03/2012 PET scan   CT - progressive disease   2013 Treatment Plan Change   Letrozole   01/2016 Imaging   MRI showed progressive disease   2017 Treatment Plan Change   Two weeks of alternating Tamoxifen 43m daily and then Megace 455mTID   08/2016 Imaging   MRI showed progressive disease with peritoneal implants near liver, in pelvis   01/2017 Treatment Plan Change   Lupron 11.25  q 3 months   11/2017 Imaging   Overall mixed response.   Mixed cystic/solid lesions in the left pelvis are mildly improved.   Cystic peritoneal disease, including the dominant lesion along the posterior right hepatic lobe, is mildly progressed.   Subcapsular lesion along the posterior right hepatic lobe is unchanged.   03/2018 Imaging   MRI A/P: Mixed response of individual  peritoneal metastases in the pelvis, as described above. Overall, there has been no significant change in bulk of disease.   Stable cystic peritoneal metastatic disease along the capsular surfaces of the liver and spleen.   No new sites of metastatic disease identified within the abdomen or pelvis.   08/2018 Imaging   MRI A/P: Status post hysterectomy and bilateral salpingo-oophorectomy.   Mixed cystic/solid peritoneal implants in the abdomen/pelvis, as above. Dominant cystic implant along the posterior liver surface is mildly increased. Remaining lesions are overall grossly unchanged.   No new lesions are identified.   01/28/2019 Imaging   Mri A/P: 1. Relatively similar appearance of peritoneal metastasis. A posterior right hepatic capsular based lesion is similar to minimally decreased in size. Left pelvic implants are primarily similar with possible enlargement of an anterior high left pelvic cystic implant. No new disease identified. 2.  Aortic Atherosclerosis (ICD10-I70.0). 3. Hepatic steatosis. 4. Left adrenal adenoma.   06/02/2019 Imaging   MRI 1. Potential slight enlargement of dominant cystic area and solid component, associated with rind like signal variation on T2 along the inferior right hepatic margin, also potentially slightly increased. Findings may still be within the realm of measurement and technical variation. Close attention on follow-up. 2. Subtle cystic changes along the cephalad margin of the spleen are difficult to see on previous imaging, perhaps new compared with prior imaging studies. 3. Pelvic implants and left lower quadrant lesion with similar size, of the area along the left iliac vasculature may be slightly larger than on the prior study. 4. Signs of extensive retroperitoneal and pelvic lymphadenectomy. 5. Hepatic steatosis. 6. 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     06/08/2019 Cancer Staging   Staging  form: [place], AJCC 7th Edition - Clinical: Stage IIIC (rT2, N1, M0) - Signed by Heath Lark, MD on 06/08/2019   06/10/2019 Imaging   1. 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 These findings are slightly worsened in comparison to a remote prior CT examination dated 10/04/2014.   2.  No evidence of metastatic disease in the chest.   3. Status post hysterectomy, pelvic and retroperitoneal lymph node dissection, and ventral hernia mesh repair.   4.  Hepatic steatosis.   5.  Aortic Atherosclerosis (ICD10-I70.0).   06/24/2019 - 09/02/2019 Chemotherapy   The patient had carboplatin for chemotherapy treatment.     09/02/2019 Tumor Marker   Patient's tumor was tested for the following markers: Inhibin B Results of the tumor marker test revealed 94   09/23/2019 Imaging   1. Interval progression of the soft tissue lesions in the anterior left pelvis, likely peritoneal implants, compatible with disease progression. Remaining sites of apparent disease along the liver capsule and posterior spleen are stable 2. Stable 2 cm left adrenal adenoma. 3. Hepatic steatosis. 4. Right-side predominant colonic diverticulosis without diverticulitis. 5. Aortic Atherosclerosis (ICD10-I70.0).   12/23/2019 Imaging   1. Slight interval  increase in size of a mixed solid and cystic nodule in the left hemipelvis measuring 3.0 x 2.9 cm, previously 2.7 x 2.1 cm. 2. Interval decrease in size of a nodule in the left paracolic gutter measuring 1.0 x 0.8 cm, previously 2.1 x 1.6 cm. 3. Stable peritoneal nodules in the hepatorenal recess and at the inferior tip of the spleen. 4. Unchanged nodule or lymph node overlying the left external iliac artery. 5. Enlargement of dominant left pelvic nodule is concerning for disease progression despite interval  decrease in size of a nodule in the left paracolic gutter and stability of other nodules. 6. No evidence of metastatic disease in the chest. 7. Stable, benign left adrenal adenoma. 8. Ventral hernia mesh repair with a small component of recurrent hernia inferiorly, containing a single nonobstructed loop of small bowel. 9. Hepatic steatosis. 10. Aortic Atherosclerosis (ICD10-I70.0).   12/23/2019 Tumor Marker   Patient's tumor was tested for the following markers: Inhibin B Results of the tumor marker test revealed 94.9   01/25/2020 Tumor Marker   Patient's tumor was tested for the following markers: Inhibin B Results of the tumor marker test revealed 116.7   02/20/2020 Genetic Testing   Negative genetic testing: no pathogenic variants detected in [facility] Multi-Cancer Panel.  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  The report date is February 20, 2020.   The Multi-Cancer Panel offered by [facility] includes sequencing and/or deletion duplication testing of the following 85 genes: AIP, ALK, APC, ATM, AXIN2,BAP1,  BARD1, BLM, BMPR1A, BRCA1, BRCA2, BRIP1, CASR, CDC73, CDH1, CDK4, CDKN1B, CDKN1C, CDKN2A (p14ARF), CDKN2A (p16INK4a), CEBPA, CHEK2, CTNNA1, DICER1, DIS3L2, EGFR (c.2369C>T, p.Thr790Met variant only), EPCAM (Deletion/duplication testing only), FH, FLCN, GATA2, GPC3, GREM1 (Promoter region deletion/duplication testing only), HOXB13 (c.251G>A, p.Gly84Glu), HRAS, KIT, MAX, MEN1, MET, MITF (c.952G>A, p.Glu318Lys variant only), MLH1, MSH2, MSH3, MSH6, MUTYH, NBN, NF1, NF2, NTHL1, PALB2, PDGFRA, PHOX2B, PMS2, POLD1, POLE, POT1, PRKAR1A, PTCH1, PTEN, RAD50, RAD51C, RAD51D, RB1, RECQL4, RET, RNF43, RUNX1, SDHAF2, SDHA (sequence changes only), SDHB, SDHC, SDHD, SMAD4, SMARCA4, SMARCB1, SMARCE1, STK11, SUFU, TERC, TERT, TMEM127, TP53, TSC1, TSC2, VHL, WRN and WT1.    03/02/2020 Tumor Marker   Patient's tumor was tested for the following markers: Inhibin B Results of the  tumor marker test revealed 80.8     REVIEW OF SYSTEMS:   Constitutional: Denies fevers, chills or abnormal weight loss Eyes: Denies blurriness of vision Ears, nose, mouth, throat, and face: Denies mucositis or sore throat Respiratory: Denies cough, dyspnea or wheezes Cardiovascular: Denies palpitation, chest discomfort or lower extremity swelling Gastrointestinal:  Denies nausea, heartburn or change in bowel habits Skin: Denies abnormal skin rashes Lymphatics: Denies new lymphadenopathy or easy bruising Neurological:Denies numbness, tingling or new weaknesses Behavioral/Psych: Mood is stable, no new changes  All other systems were reviewed with the patient and are negative.  I have reviewed the past medical history, past surgical history, social history and family history with the patient and they are unchanged from previous note.  ALLERGIES:  is allergic to codeine, oxycodone, sodium thiosalicylate, ciprofloxacin, codone [hydrocodone bitartrate], daypro [oxaprozin], levofloxacin, stadol [butorphanol tartrate], and sulfa antibiotics.  MEDICATIONS:  Current Outpatient Medications  Medication Sig Dispense Refill  . aspirin 325 MG tablet Take 325 mg by mouth daily.    . Bevacizumab (AVASTIN IV) Inject into the vein. Every 3 weeks    . Biotin 10 MG CAPS Take by mouth.    Marland Kitchen buPROPion (WELLBUTRIN SR) 200 MG 12 hr tablet Take 1 tablet (200 mg  total) by mouth daily. 30 tablet 0  . Calcium Citrate 200 MG TABS Take by mouth.    . escitalopram (LEXAPRO) 20 MG tablet Take 10 mg by mouth daily.     . metFORMIN (GLUCOPHAGE) 1000 MG tablet 1/2 tab BID with meals 60 tablet 0  . mirabegron ER (MYRBETRIQ) 25 MG TB24 tablet Take 25 mg by mouth daily.    . Multiple Vitamin (MULTIVITAMIN) capsule Take 1 capsule by mouth daily.    Marland Kitchen omeprazole (PRILOSEC) 20 MG capsule Take 20 mg by mouth daily.     . Probiotic Product (ALIGN PO) Take 1 tablet by mouth daily.     . Vitamin D, Ergocalciferol, (DRISDOL)  1.25 MG (50000 UNIT) CAPS capsule Take 1 capsule (50,000 Units total) by mouth every 14 (fourteen) days. 4 capsule 0   No current facility-administered medications for this visit.    PHYSICAL EXAMINATION: ECOG PERFORMANCE STATUS: 1 - Symptomatic but completely ambulatory  Vitals:   03/30/20 1030  BP: 117/74  Pulse: 60  Resp: 20  Temp: (!) 97.5 F (36.4 C)  SpO2: 99%   Filed Weights   03/30/20 1030  Weight: 195 lb (88.5 kg)    GENERAL:alert, no distress and comfortable NEURO: alert & oriented x 3 with fluent speech, no focal motor/sensory deficits  LABORATORY DATA:  I have reviewed the data as listed    Component Value Date/Time   NA 138 03/30/2020 0841   NA 139 11/30/2019 0913   NA 141 09/24/2013 1131   K 4.8 03/30/2020 0841   K 4.5 09/24/2013 1131   CL 106 03/30/2020 0841   CO2 23 03/30/2020 0841   CO2 24 09/24/2013 1131   GLUCOSE 94 03/30/2020 0841   GLUCOSE 90 09/24/2013 1131   BUN 33 (H) 03/30/2020 0841   BUN 32 (H) 11/30/2019 0913   BUN 21.2 09/24/2013 1131   CREATININE 1.34 (H) 03/30/2020 0841   CREATININE 1.18 (H) 12/21/2019 1259   CREATININE 1.1 09/24/2013 1131   CALCIUM 9.2 03/30/2020 0841   CALCIUM 9.5 09/24/2013 1131   PROT 7.1 03/30/2020 0841   PROT 7.0 11/30/2019 0913   PROT 6.8 09/24/2013 1131   ALBUMIN 3.8 03/30/2020 0841   ALBUMIN 4.3 11/30/2019 0913   ALBUMIN 3.9 09/24/2013 1131   AST 20 03/30/2020 0841   AST 18 12/21/2019 1259   AST 23 09/24/2013 1131   ALT 23 03/30/2020 0841   ALT 18 12/21/2019 1259   ALT 27 09/24/2013 1131   ALKPHOS 53 03/30/2020 0841   ALKPHOS 81 09/24/2013 1131   BILITOT 0.4 03/30/2020 0841   BILITOT 0.3 12/21/2019 1259   BILITOT 0.53 09/24/2013 1131   GFRNONAA 45 (L) 03/30/2020 0841   GFRNONAA 49 (L) 12/21/2019 1259   GFRAA 55 (L) 01/24/2020 1530   GFRAA 57 (L) 12/21/2019 1259    No results found for: SPEP, UPEP  Lab Results  Component Value Date   WBC 5.1 03/30/2020   NEUTROABS 2.9 03/30/2020   HGB  14.1 03/30/2020   HCT 40.7 03/30/2020   MCV 90.2 03/30/2020   PLT 279 03/30/2020      Chemistry      Component Value Date/Time   NA 138 03/30/2020 0841   NA 139 11/30/2019 0913   NA 141 09/24/2013 1131   K 4.8 03/30/2020 0841   K 4.5 09/24/2013 1131   CL 106 03/30/2020 0841   CO2 23 03/30/2020 0841   CO2 24 09/24/2013 1131   BUN 33 (H) 03/30/2020 4818  BUN 32 (H) 11/30/2019 0913   BUN 21.2 09/24/2013 1131   CREATININE 1.34 (H) 03/30/2020 0841   CREATININE 1.18 (H) 12/21/2019 1259   CREATININE 1.1 09/24/2013 1131      Component Value Date/Time   CALCIUM 9.2 03/30/2020 0841   CALCIUM 9.5 09/24/2013 1131   ALKPHOS 53 03/30/2020 0841   ALKPHOS 81 09/24/2013 1131   AST 20 03/30/2020 0841   AST 18 12/21/2019 1259   AST 23 09/24/2013 1131   ALT 23 03/30/2020 0841   ALT 18 12/21/2019 1259   ALT 27 09/24/2013 1131   BILITOT 0.4 03/30/2020 0841   BILITOT 0.3 12/21/2019 1259   BILITOT 0.53 09/24/2013 1131

## 2020-04-03 ENCOUNTER — Encounter (INDEPENDENT_AMBULATORY_CARE_PROVIDER_SITE_OTHER): Payer: Self-pay | Admitting: Family Medicine

## 2020-04-05 ENCOUNTER — Encounter (INDEPENDENT_AMBULATORY_CARE_PROVIDER_SITE_OTHER): Payer: Self-pay | Admitting: Family Medicine

## 2020-04-05 ENCOUNTER — Other Ambulatory Visit: Payer: Self-pay

## 2020-04-05 ENCOUNTER — Other Ambulatory Visit (INDEPENDENT_AMBULATORY_CARE_PROVIDER_SITE_OTHER): Payer: Self-pay | Admitting: Family Medicine

## 2020-04-05 ENCOUNTER — Encounter: Payer: Self-pay | Admitting: Hematology and Oncology

## 2020-04-05 ENCOUNTER — Ambulatory Visit (INDEPENDENT_AMBULATORY_CARE_PROVIDER_SITE_OTHER): Payer: 59 | Admitting: Family Medicine

## 2020-04-05 VITALS — BP 119/70 | HR 63 | Temp 98.0°F | Ht 66.0 in | Wt 191.0 lb

## 2020-04-05 DIAGNOSIS — F3289 Other specified depressive episodes: Secondary | ICD-10-CM | POA: Diagnosis not present

## 2020-04-05 DIAGNOSIS — K219 Gastro-esophageal reflux disease without esophagitis: Secondary | ICD-10-CM | POA: Diagnosis not present

## 2020-04-05 DIAGNOSIS — Z9189 Other specified personal risk factors, not elsewhere classified: Secondary | ICD-10-CM | POA: Diagnosis not present

## 2020-04-05 DIAGNOSIS — E669 Obesity, unspecified: Secondary | ICD-10-CM | POA: Diagnosis not present

## 2020-04-05 DIAGNOSIS — Z6830 Body mass index (BMI) 30.0-30.9, adult: Secondary | ICD-10-CM | POA: Diagnosis not present

## 2020-04-05 MED ORDER — BUPROPION HCL ER (XL) 300 MG PO TB24: 300.0000 mg | ORAL_TABLET | Freq: Every day | ORAL | 0 refills | Status: DC

## 2020-04-05 MED FILL — buPROPion HCL ER (XL) 300 M: 300 | 30 days supply | Qty: 30 | Fill #0

## 2020-04-06 ENCOUNTER — Telehealth: Payer: Self-pay

## 2020-04-06 DIAGNOSIS — M1812 Unilateral primary osteoarthritis of first carpometacarpal joint, left hand: Secondary | ICD-10-CM | POA: Diagnosis not present

## 2020-04-06 DIAGNOSIS — M189 Osteoarthritis of first carpometacarpal joint, unspecified: Secondary | ICD-10-CM | POA: Diagnosis not present

## 2020-04-06 NOTE — Telephone Encounter (Signed)
Called and left a message for her to call the office back regarding scheduling appts after CT.

## 2020-04-07 MED FILL — MYRBETRIQ ER 50 MG TABLET: 50 | 90 days supply | Qty: 90 | Fill #1

## 2020-04-07 MED FILL — ESCITALOPRAM 20 MG TABLET: 20 | 90 days supply | Qty: 90 | Fill #3

## 2020-04-07 NOTE — Telephone Encounter (Signed)
She called back. She will keep CT as scheduled. She is agreeable to schedule appt with Dr. Alvy Bimler on 1/5 at 2 pm. Appt scheduled.

## 2020-04-10 NOTE — Progress Notes (Signed)
Chief Complaint:   OBESITY [name] is here to discuss her progress with her obesity treatment plan along with follow-up of her obesity related diagnoses. [name] is on the [employer] and states she is following her eating plan approximately 50% of the time. [name] states she is walking for 30 minutes 2 times per week, and aerobics for 60 minutes 1 time per week.  Today's visit was #: 52 Starting weight: 209 lbs Starting date: 04/29/2018 Today's weight: 191 lbs Today's date: 04/05/2020 Total lbs lost to date: 18 Total lbs lost since last in-office visit: 2  Interim History: [name] has been craving sweets more in the past few weeks than she was previously. She is noticing that she isn't as able to control cravings. She finds that dinner, she is often favoring a protein shake instead of dinner secondary to fatigue.  Subjective:   1. Gastroesophageal reflux disease without esophagitis [name]'s reflux is well controlled with Prilosec, and she denies symptoms currently.  2. Other depression, with emotional eating [name] denies suicidal ideas or homicidal ideas. She notes increase in sweets recently. She denies side effects of elevated blood pressure or dry mouth.  3. At risk for side effect of medication [name] is at risk for drug side effects due to increased dose of Wellbutrin.  Assessment/Plan:   1. Gastroesophageal reflux disease without esophagitis Intensive lifestyle modifications are the first line treatment for this issue. We discussed several lifestyle modifications today. Ariba will continue Prilosec, no refill needed. She will continue to work on diet, exercise and weight loss efforts. Orders and follow up as documented in patient record.   Counseling . If a person has gastroesophageal reflux disease (GERD), food and stomach acid move back up into the esophagus and cause symptoms or problems such as damage to the esophagus. . Anti-reflux measures include: raising the head of  the bed, avoiding tight clothing or belts, avoiding eating late at night, not lying down shortly after mealtime, and achieving weight loss. . Avoid ASA, NSAID's, caffeine, alcohol, and tobacco.  . OTC Pepcid and/or Tums are often very helpful for as needed use.  Marland Kitchen However, for persisting chronic or daily symptoms, stronger medications like Omeprazole may be needed. . You may need to avoid foods and drinks such as: ? Coffee and tea (with or without caffeine). ? Drinks that contain alcohol. ? Energy drinks and sports drinks. ? Bubbly (carbonated) drinks or sodas. ? Chocolate and cocoa. ? Peppermint and mint flavorings. ? Garlic and onions. ? Horseradish. ? Spicy and acidic foods. These include peppers, chili powder, curry powder, vinegar, hot sauces, and BBQ sauce. ? Citrus fruit juices and citrus fruits, such as oranges, lemons, and limes. ? Tomato-based foods. These include red sauce, chili, salsa, and pizza with red sauce. ? Fried and fatty foods. These include donuts, french fries, potato chips, and high-fat dressings. ? High-fat meats. These include hot dogs, rib eye steak, sausage, ham, and bacon.  2. Other depression, with emotional eating Behavior modification techniques were discussed today to help [name] deal with her emotional/non-hunger eating behaviors. [name] agreed to increase Wellbutrin XL to 300 mg PO daily #30 with no refills. Orders and follow up as documented in patient record.   - buPROPion (WELLBUTRIN XL) 300 MG 24 hr tablet; Take 1 tablet (300 mg total) by mouth daily.  Dispense: 30 tablet; Refill: 0  3. At risk for side effect of medication [name] was given approximately 15 minutes of drug side effect counseling today. We  discussed side effect possibility and risk versus benefits. [name] agreed to the medication and will contact this office if these side effects are intolerable.  Repetitive spaced learning was employed today to elicit superior memory formation and  behavioral change.  4. Class 1 obesity with serious comorbidity and body mass index (BMI) of 30.0 to 30.9 in adult, unspecified obesity type [name] is currently in the action stage of change. As such, her goal is to continue with weight loss efforts. She has agreed to the [employer].   Exercise goals: As is.  Behavioral modification strategies: increasing lean protein intake, meal planning and cooking strategies, keeping healthy foods in the home, holiday eating strategies  and planning for success.  [name] has agreed to follow-up with our clinic in 4 weeks. She was informed of the importance of frequent follow-up visits to maximize her success with intensive lifestyle modifications for her multiple health conditions.   Objective:   Blood pressure 119/70, pulse 63, temperature 98 F (36.7 C), temperature source Oral, height 5\' 6"  (1.676 m), weight 191 lb (86.6 kg), SpO2 98 %. Body mass index is 30.83 kg/m.  General: Cooperative, alert, well developed, in no acute distress. HEENT: Conjunctivae and lids unremarkable. Cardiovascular: Regular rhythm.  Lungs: Normal work of breathing. Neurologic: No focal deficits.   Lab Results  Component Value Date   CREATININE 1.34 (H) 03/30/2020   BUN 33 (H) 03/30/2020   NA 138 03/30/2020   K 4.8 03/30/2020   CL 106 03/30/2020   CO2 23 03/30/2020   Lab Results  Component Value Date   ALT 23 03/30/2020   AST 20 03/30/2020   ALKPHOS 53 03/30/2020   BILITOT 0.4 03/30/2020   Lab Results  Component Value Date   HGBA1C 5.2 11/30/2019   HGBA1C 5.3 05/31/2019   HGBA1C 5.2 11/19/2018   HGBA1C 5.5 04/29/2018   Lab Results  Component Value Date   INSULIN 23.8 11/30/2019   INSULIN 16.5 05/31/2019   INSULIN 17.6 11/19/2018   INSULIN 25.8 (H) 04/29/2018   Lab Results  Component Value Date   TSH 0.707 11/30/2019   Lab Results  Component Value Date   CHOL 224 (H) 11/30/2019   HDL 52 11/30/2019   LDLCALC 136 (H) 11/30/2019   TRIG 201  (H) 11/30/2019   Lab Results  Component Value Date   WBC 5.1 03/30/2020   HGB 14.1 03/30/2020   HCT 40.7 03/30/2020   MCV 90.2 03/30/2020   PLT 279 03/30/2020   No results found for: IRON, TIBC, FERRITIN  Attestation Statements:   Reviewed by clinician on day of visit: allergies, medications, problem list, medical history, surgical history, family history, social history, and previous encounter notes.   I, Trixie Dredge, am acting as transcriptionist for Coralie Common, MD.  I have reviewed the above documentation for accuracy and completeness, and I agree with the above. - Jinny Blossom, MD

## 2020-04-12 ENCOUNTER — Inpatient Hospital Stay: Payer: 59 | Admitting: Hematology and Oncology

## 2020-04-17 ENCOUNTER — Other Ambulatory Visit: Payer: Self-pay

## 2020-04-17 ENCOUNTER — Ambulatory Visit (HOSPITAL_COMMUNITY)
Admission: RE | Admit: 2020-04-17 | Discharge: 2020-04-17 | Disposition: A | Discharge status: Home / Self Care | Payer: 59 | Source: Ambulatory Visit | Attending: Hematology and Oncology | Admitting: Hematology and Oncology

## 2020-04-17 DIAGNOSIS — C569 Malignant neoplasm of unspecified ovary: Secondary | ICD-10-CM | POA: Insufficient documentation

## 2020-04-17 DIAGNOSIS — K439 Ventral hernia without obstruction or gangrene: Secondary | ICD-10-CM | POA: Diagnosis not present

## 2020-04-17 DIAGNOSIS — E278 Other specified disorders of adrenal gland: Secondary | ICD-10-CM | POA: Diagnosis not present

## 2020-04-17 DIAGNOSIS — N281 Cyst of kidney, acquired: Secondary | ICD-10-CM | POA: Diagnosis not present

## 2020-04-17 MED ORDER — IOHEXOL 9 MG/ML PO SOLN: 1000.0000 mL | ORAL | Status: AC

## 2020-04-17 MED ORDER — IOHEXOL 300 MG/ML  SOLN
75.0000 mL | Freq: Once | INTRAMUSCULAR | Status: AC | PRN
  Administered 2020-04-17: 75 mL via INTRAVENOUS

## 2020-04-17 MED ORDER — IOHEXOL 9 MG/ML PO SOLN
ORAL | Status: AC
  Administered 2020-04-17: 500 mL via ORAL
  Filled 2020-04-17: qty 1000

## 2020-04-18 DIAGNOSIS — H35372 Puckering of macula, left eye: Secondary | ICD-10-CM | POA: Diagnosis not present

## 2020-04-18 DIAGNOSIS — H5213 Myopia, bilateral: Secondary | ICD-10-CM | POA: Diagnosis not present

## 2020-04-18 DIAGNOSIS — H52223 Regular astigmatism, bilateral: Secondary | ICD-10-CM | POA: Diagnosis not present

## 2020-04-18 DIAGNOSIS — H43812 Vitreous degeneration, left eye: Secondary | ICD-10-CM | POA: Diagnosis not present

## 2020-04-19 ENCOUNTER — Other Ambulatory Visit: Payer: 59

## 2020-04-19 ENCOUNTER — Ambulatory Visit: Payer: 59

## 2020-04-25 ENCOUNTER — Other Ambulatory Visit: Payer: Self-pay

## 2020-04-25 ENCOUNTER — Inpatient Hospital Stay: Payer: 59 | Attending: Hematology | Admitting: Hematology and Oncology

## 2020-04-25 ENCOUNTER — Other Ambulatory Visit: Payer: Self-pay | Admitting: Hematology and Oncology

## 2020-04-25 ENCOUNTER — Telehealth: Payer: Self-pay

## 2020-04-25 ENCOUNTER — Telehealth: Payer: Self-pay | Admitting: Hematology and Oncology

## 2020-04-25 VITALS — BP 140/52 | HR 62 | Temp 95.7°F | Resp 18 | Ht 66.0 in

## 2020-04-25 DIAGNOSIS — Z7189 Other specified counseling: Secondary | ICD-10-CM

## 2020-04-25 DIAGNOSIS — I10 Essential (primary) hypertension: Secondary | ICD-10-CM | POA: Insufficient documentation

## 2020-04-25 DIAGNOSIS — M810 Age-related osteoporosis without current pathological fracture: Secondary | ICD-10-CM | POA: Insufficient documentation

## 2020-04-25 DIAGNOSIS — C569 Malignant neoplasm of unspecified ovary: Secondary | ICD-10-CM | POA: Diagnosis not present

## 2020-04-25 DIAGNOSIS — Z5112 Encounter for antineoplastic immunotherapy: Secondary | ICD-10-CM | POA: Insufficient documentation

## 2020-04-25 DIAGNOSIS — N183 Chronic kidney disease, stage 3 unspecified: Secondary | ICD-10-CM | POA: Diagnosis not present

## 2020-04-25 DIAGNOSIS — R030 Elevated blood-pressure reading, without diagnosis of hypertension: Secondary | ICD-10-CM

## 2020-04-25 MED ORDER — EXEMESTANE 25 MG PO TABS: 25.0000 mg | ORAL_TABLET | Freq: Every day | ORAL | 11 refills | Status: DC

## 2020-04-25 MED FILL — EXEMESTANE 25 MG TABS: 25 | 90 days supply | Qty: 90 | Fill #0

## 2020-04-25 NOTE — Telephone Encounter (Signed)
Called and scheduled appt today at 10 am. Appt canceled for tomorrow. She is aware of appt time.

## 2020-04-25 NOTE — Telephone Encounter (Signed)
Spoke to patient regarding scheduling follow-up appointments per 1/4 schedule message. Patient stated she will call back to schedule.

## 2020-04-26 ENCOUNTER — Encounter: Payer: Self-pay | Admitting: Hematology and Oncology

## 2020-04-26 ENCOUNTER — Inpatient Hospital Stay: Payer: 59 | Admitting: Hematology and Oncology

## 2020-04-26 DIAGNOSIS — R030 Elevated blood-pressure reading, without diagnosis of hypertension: Secondary | ICD-10-CM | POA: Insufficient documentation

## 2020-04-26 NOTE — Assessment & Plan Note (Signed)
I have reviewed multiple imaging studies with the patient She has mixed response Majority of the nodules are small except for one of the nodules I recommend the addition of antiestrogen therapy We discussed the risk, benefits, side effects of antiestrogen therapy including weight gain, depression, mood swing and osteoporosis and she is in agreement to proceed She will continue bevacizumab every 3 weeks I will see her back next month for further follow-up and review of toxicity I recommend we get bone density scan done soon

## 2020-04-26 NOTE — Progress Notes (Signed)
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[place]) I have reviewed multiple imaging studies with the patient She has mixed response Majority of the nodules are small except for one of the nodules I recommend the addition of antiestrogen therapy We discussed the risk, benefits, side effects of antiestrogen therapy including weight gain, depression, mood swing and osteoporosis and she is in agreement to proceed She will continue bevacizumab every 3 weeks I will see her back next month for further follow-up and review of toxicity I recommend we get bone density scan done soon  CKD (chronic kidney disease), stage III She has intermittent elevated renal function likely secondary to dehydration We discussed the importance of aggressive risk factor management  Elevated BP without diagnosis of hypertension She is noted to have slightly elevated blood pressure but could be due to anxiety Monitor closely for now  Goals of care, counseling/discussion We discussed the goals of care We discussed the rationale behind additional antiestrogen therapy She is in agreement to proceed She is aware that treatment goal is palliative   Orders Placed This Encounter  Procedures  . DG Bone Density    Standing Status:   Future    Standing Expiration Date:   04/25/2021    Order Specific Question:   Reason for Exam (SYMPTOM  OR DIAGNOSIS REQUIRED)    Answer:   osteoporosis    Order Specific Question:   Preferred imaging location?    Answer:   [facility]    All questions were answered. The patient knows to call the clinic with any problems, questions or concerns. The total time spent in the appointment was 40 minutes encounter with patients including review of chart and various tests results, discussions about plan of care and coordination of care plan   Heath Lark, MD 04/26/2020 3:34 PM  INTERVAL HISTORY: Please see below for problem oriented charting. She returns for further follow-up She feels well Denies recent abdominal pain, changes in bowel habits or nausea Her appetite is fair No venous access issue Denies recent headache or significant changes in her blood pressure  SUMMARY OF ONCOLOGIC HISTORY: Oncology History Overview Note  2018 - Core biopsy for ER/PR and Foundation One testing performed. Unfortunately, no sufficient tissue for Foundation One testing. ER was 50% and PR 90%. 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     - 09/2000 Chemotherapy   6 cycles of IP cisplatin and etoposide    02/2009 Relapse/Recurrence   CT showed increased size of nodules in pelvis   2010 Surgery   Exlap with section of tumor nodules near cecum and left pelvic sidewall. Tumor: ER negative, PR positive    Treatment Plan Change   Alternated 2 week courses of Megace and Tamoxifen - ended 03/2012   03/2012 PET scan   CT - progressive disease  2013 Treatment Plan Change   Letrozole   01/2016 Imaging   MRI showed progressive disease   2017 Treatment Plan Change   Two weeks of alternating Tamoxifen 5m daily and then Megace 480mTID   08/2016 Imaging   MRI showed progressive disease with peritoneal implants near liver, in pelvis    01/2017 Treatment Plan Change   Lupron 11.25 q 3 months   11/2017 Imaging   Overall mixed response.   Mixed cystic/solid lesions in the left pelvis are mildly improved.   Cystic peritoneal disease, including the dominant lesion along the posterior right hepatic lobe, is mildly progressed.   Subcapsular lesion along the posterior right hepatic lobe is unchanged.   03/2018 Imaging   MRI A/P: Mixed response of individual peritoneal metastases in the pelvis, as described above. Overall, there has been no significant change in bulk of disease.   Stable cystic peritoneal metastatic disease along the capsular surfaces of the liver and spleen.   No new sites of metastatic disease identified within the abdomen or pelvis.   08/2018 Imaging   MRI A/P: Status post hysterectomy and bilateral salpingo-oophorectomy.   Mixed cystic/solid peritoneal implants in the abdomen/pelvis, as above. Dominant cystic implant along the posterior liver surface is mildly increased. Remaining lesions are overall grossly unchanged.   No new lesions are identified.   01/28/2019 Imaging   Mri A/P: 1. Relatively similar appearance of peritoneal metastasis. A posterior right hepatic capsular based lesion is similar to minimally decreased in size. Left pelvic implants are primarily similar with possible enlargement of an anterior high left pelvic cystic implant. No new disease identified. 2.  Aortic Atherosclerosis (ICD10-I70.0). 3. Hepatic steatosis. 4. Left adrenal adenoma.   06/02/2019 Imaging   MRI 1. Potential slight enlargement of dominant cystic area and solid component, associated with rind like signal variation on T2 along the inferior right hepatic margin, also potentially slightly increased. Findings may still be within the realm of measurement and technical variation. Close attention on follow-up. 2. Subtle cystic changes along the cephalad margin of the spleen are difficult to see on previous imaging,  perhaps new compared with prior imaging studies. 3. Pelvic implants and left lower quadrant lesion with similar size, of the area along the left iliac vasculature may be slightly larger than on the prior study. 4. Signs of extensive retroperitoneal and pelvic lymphadenectomy. 5. Hepatic steatosis. 6. 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     06/08/2019 Cancer Staging   Staging form: [place], AJCC 7th Edition - Clinical: Stage IIIC (rT2, N1, M0) - Signed by GoHeath LarkMD on 06/08/2019   06/10/2019 Imaging   1. 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 These findings are slightly worsened in comparison to a remote prior CT examination dated 10/04/2014.   2.  No evidence of metastatic disease in the chest.   3. Status post hysterectomy, pelvic and retroperitoneal lymph node dissection, and ventral hernia mesh repair.   4.  Hepatic steatosis.   5.  Aortic Atherosclerosis (ICD10-I70.0).   06/24/2019 - 09/02/2019 Chemotherapy   The patient had carboplatin for chemotherapy treatment.     09/02/2019 Tumor Marker   Patient's tumor was tested for the following markers: Inhibin B Results of the tumor marker test revealed 94   09/23/2019 Imaging  1. Interval progression of the soft tissue lesions in the anterior left pelvis, likely peritoneal implants, compatible with disease progression. Remaining sites of apparent disease along the liver capsule and posterior spleen are stable 2. Stable 2 cm left adrenal adenoma. 3. Hepatic steatosis. 4. Right-side predominant colonic diverticulosis without diverticulitis. 5. Aortic Atherosclerosis (ICD10-I70.0).   12/23/2019 Imaging   1. Slight interval increase in size of a mixed solid and cystic nodule in the left hemipelvis  measuring 3.0 x 2.9 cm, previously 2.7 x 2.1 cm. 2. Interval decrease in size of a nodule in the left paracolic gutter measuring 1.0 x 0.8 cm, previously 2.1 x 1.6 cm. 3. Stable peritoneal nodules in the hepatorenal recess and at the inferior tip of the spleen. 4. Unchanged nodule or lymph node overlying the left external iliac artery. 5. Enlargement of dominant left pelvic nodule is concerning for disease progression despite interval decrease in size of a nodule in the left paracolic gutter and stability of other nodules. 6. No evidence of metastatic disease in the chest. 7. Stable, benign left adrenal adenoma. 8. Ventral hernia mesh repair with a small component of recurrent hernia inferiorly, containing a single nonobstructed loop of small bowel. 9. Hepatic steatosis. 10. Aortic Atherosclerosis (ICD10-I70.0).   12/23/2019 Tumor Marker   Patient's tumor was tested for the following markers: Inhibin B Results of the tumor marker test revealed 94.9   01/25/2020 Tumor Marker   Patient's tumor was tested for the following markers: Inhibin B Results of the tumor marker test revealed 116.7   02/20/2020 Genetic Testing   Negative genetic testing: no pathogenic variants detected in [facility] Multi-Cancer Panel.  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  The report date is February 20, 2020.   The Multi-Cancer Panel offered by [facility] includes sequencing and/or deletion duplication testing of the following 85 genes: AIP, ALK, APC, ATM, AXIN2,BAP1,  BARD1, BLM, BMPR1A, BRCA1, BRCA2, BRIP1, CASR, CDC73, CDH1, CDK4, CDKN1B, CDKN1C, CDKN2A (p14ARF), CDKN2A (p16INK4a), CEBPA, CHEK2, CTNNA1, DICER1, DIS3L2, EGFR (c.2369C>T, p.Thr790Met variant only), EPCAM (Deletion/duplication testing only), FH, FLCN, GATA2, GPC3, GREM1 (Promoter region deletion/duplication testing only), HOXB13 (c.251G>A, p.Gly84Glu), HRAS, KIT, MAX, MEN1, MET, MITF (c.952G>A, p.Glu318Lys variant only), MLH1, MSH2,  MSH3, MSH6, MUTYH, NBN, NF1, NF2, NTHL1, PALB2, PDGFRA, PHOX2B, PMS2, POLD1, POLE, POT1, PRKAR1A, PTCH1, PTEN, RAD50, RAD51C, RAD51D, RB1, RECQL4, RET, RNF43, RUNX1, SDHAF2, SDHA (sequence changes only), SDHB, SDHC, SDHD, SMAD4, SMARCA4, SMARCB1, SMARCE1, STK11, SUFU, TERC, TERT, TMEM127, TP53, TSC1, TSC2, VHL, WRN and WT1.    03/02/2020 Tumor Marker   Patient's tumor was tested for the following markers: Inhibin B Results of the tumor marker test revealed 80.8   04/17/2020 Imaging   1. Overall, exam is stable. Multiple peritoneal nodules are again seen. The index nodule in the left lower quadrant is mildly increased in size in the interval. The lesion within the anterior left pelvis has decreased in size in the interval. There has also been decrease in size of cystic lesion within the a hepatorenal recess. The remaining peritoneal lesions are unchanged. No new lesions identified. 2. Stable left adrenal nodule. 3.  Aortic Atherosclerosis (ICD10-I70.0).       REVIEW OF SYSTEMS:   Constitutional: Denies fevers, chills or abnormal weight loss Eyes: Denies blurriness of vision Ears, nose, mouth, throat, and face: Denies mucositis or sore throat Respiratory: Denies cough, dyspnea or wheezes Cardiovascular: Denies palpitation, chest discomfort or lower extremity swelling Gastrointestinal:  Denies nausea, heartburn or change in bowel habits Skin: Denies abnormal  skin rashes Lymphatics: Denies new lymphadenopathy or easy bruising Neurological:Denies numbness, tingling or new weaknesses Behavioral/Psych: Mood is stable, no new changes  All other systems were reviewed with the patient and are negative.  I have reviewed the past medical history, past surgical history, social history and family history with the patient and they are unchanged from previous note.  ALLERGIES:  is allergic to codeine, oxycodone, sodium thiosalicylate, ciprofloxacin, codone [hydrocodone bitartrate], daypro [oxaprozin],  levofloxacin, stadol [butorphanol tartrate], and sulfa antibiotics.  MEDICATIONS:  Current Outpatient Medications  Medication Sig Dispense Refill  . exemestane (AROMASIN) 25 MG tablet Take 1 tablet (25 mg total) by mouth daily after breakfast. 90 tablet 11  . aspirin 325 MG tablet Take 325 mg by mouth daily.    . Bevacizumab (AVASTIN IV) Inject into the vein. Every 3 weeks    . Biotin 10 MG CAPS Take by mouth.    Marland Kitchen buPROPion (WELLBUTRIN XL) 300 MG 24 hr tablet Take 1 tablet (300 mg total) by mouth daily. 30 tablet 0  . Calcium Citrate 200 MG TABS Take by mouth.    . escitalopram (LEXAPRO) 20 MG tablet Take 10 mg by mouth daily.     . metFORMIN (GLUCOPHAGE) 1000 MG tablet 1/2 tab BID with meals 60 tablet 0  . mirabegron ER (MYRBETRIQ) 25 MG TB24 tablet Take 25 mg by mouth daily.    . Multiple Vitamin (MULTIVITAMIN) capsule Take 1 capsule by mouth daily.    Marland Kitchen omeprazole (PRILOSEC) 20 MG capsule Take 20 mg by mouth daily.    . Probiotic Product (ALIGN PO) Take 1 tablet by mouth daily.     . Vitamin D, Ergocalciferol, (DRISDOL) 1.25 MG (50000 UNIT) CAPS capsule Take 1 capsule (50,000 Units total) by mouth every 14 (fourteen) days. 4 capsule 0   No current facility-administered medications for this visit.    PHYSICAL EXAMINATION: ECOG PERFORMANCE STATUS: 1 - Symptomatic but completely ambulatory  Vitals:   04/25/20 0955  BP: (!) 140/52  Pulse: 62  Resp: 18  Temp: (!) 95.7 F (35.4 C)  SpO2: 98%   There were no vitals filed for this visit.  GENERAL:alert, no distress and comfortable Musculoskeletal:no cyanosis of digits and no clubbing  NEURO: alert & oriented x 3 with fluent speech, no focal motor/sensory deficits  LABORATORY DATA:  I have reviewed the data as listed    Component Value Date/Time   NA 138 03/30/2020 0841   NA 139 11/30/2019 0913   NA 141 09/24/2013 1131   K 4.8 03/30/2020 0841   K 4.5 09/24/2013 1131   CL 106 03/30/2020 0841   CO2 23 03/30/2020 0841   CO2  24 09/24/2013 1131   GLUCOSE 94 03/30/2020 0841   GLUCOSE 90 09/24/2013 1131   BUN 33 (H) 03/30/2020 0841   BUN 32 (H) 11/30/2019 0913   BUN 21.2 09/24/2013 1131   CREATININE 1.34 (H) 03/30/2020 0841   CREATININE 1.18 (H) 12/21/2019 1259   CREATININE 1.1 09/24/2013 1131   CALCIUM 9.2 03/30/2020 0841   CALCIUM 9.5 09/24/2013 1131   PROT 7.1 03/30/2020 0841   PROT 7.0 11/30/2019 0913   PROT 6.8 09/24/2013 1131   ALBUMIN 3.8 03/30/2020 0841   ALBUMIN 4.3 11/30/2019 0913   ALBUMIN 3.9 09/24/2013 1131   AST 20 03/30/2020 0841   AST 18 12/21/2019 1259   AST 23 09/24/2013 1131   ALT 23 03/30/2020 0841   ALT 18 12/21/2019 1259   ALT 27 09/24/2013 1131   ALKPHOS 53  03/30/2020 0841   ALKPHOS 81 09/24/2013 1131   BILITOT 0.4 03/30/2020 0841   BILITOT 0.3 12/21/2019 1259   BILITOT 0.53 09/24/2013 1131   GFRNONAA 45 (L) 03/30/2020 0841   GFRNONAA 49 (L) 12/21/2019 1259   GFRAA 55 (L) 01/24/2020 1530   GFRAA 57 (L) 12/21/2019 1259    No results found for: SPEP, UPEP  Lab Results  Component Value Date   WBC 5.1 03/30/2020   NEUTROABS 2.9 03/30/2020   HGB 14.1 03/30/2020   HCT 40.7 03/30/2020   MCV 90.2 03/30/2020   PLT 279 03/30/2020      Chemistry      Component Value Date/Time   NA 138 03/30/2020 0841   NA 139 11/30/2019 0913   NA 141 09/24/2013 1131   K 4.8 03/30/2020 0841   K 4.5 09/24/2013 1131   CL 106 03/30/2020 0841   CO2 23 03/30/2020 0841   CO2 24 09/24/2013 1131   BUN 33 (H) 03/30/2020 0841   BUN 32 (H) 11/30/2019 0913   BUN 21.2 09/24/2013 1131   CREATININE 1.34 (H) 03/30/2020 0841   CREATININE 1.18 (H) 12/21/2019 1259   CREATININE 1.1 09/24/2013 1131      Component Value Date/Time   CALCIUM 9.2 03/30/2020 0841   CALCIUM 9.5 09/24/2013 1131   ALKPHOS 53 03/30/2020 0841   ALKPHOS 81 09/24/2013 1131   AST 20 03/30/2020 0841   AST 18 12/21/2019 1259   AST 23 09/24/2013 1131   ALT 23 03/30/2020 0841   ALT 18 12/21/2019 1259   ALT 27 09/24/2013 1131    BILITOT 0.4 03/30/2020 0841   BILITOT 0.3 12/21/2019 1259   BILITOT 0.53 09/24/2013 1131       RADIOGRAPHIC STUDIES: I have reviewed multiple imaging studies with the patient I have personally reviewed the radiological images as listed and agreed with the findings in the report. CT ABDOMEN PELVIS W CONTRAST  Result Date: 04/17/2020 CLINICAL DATA:  Evaluate restaging ovarian cancer. EXAM: CT ABDOMEN AND PELVIS WITH CONTRAST TECHNIQUE: Multidetector CT imaging of the abdomen and pelvis was performed using the standard protocol following bolus administration of intravenous contrast. CONTRAST:  69m OMNIPAQUE IOHEXOL 300 MG/ML  SOLN COMPARISON:  12/22/2019 FINDINGS: Lower chest: No acute abnormality. Hepatobiliary: Tiny nonspecific nodule in left lower lobe is stable measuring 3 mm, image 26/6. No acute or suspicious findings identified within the imaged portions of the lung bases. Pancreas: Unremarkable. No pancreatic ductal dilatation or surrounding inflammatory changes. Spleen: Normal size spleen.  Stable from previous exam. Adrenals/Urinary Tract: Normal right adrenal gland. Left adrenal nodule is stable measuring 1.8 x 1.5 cm, image 23/2. Unchanged bilateral kidney cysts. No hydronephrosis identified bilaterally. Normal appearance of the urinary bladder. Stomach/Bowel: Stomach is unremarkable. No bowel wall thickening, inflammation, or distension. Moderate stool burden noted throughout the colon. Vascular/Lymphatic: Aortic atherosclerosis. No aneurysm. Left retroperitoneal and left iliac nodal dissection has been performed. No abdominopelvic adenopathy. Reproductive: Hysterectomy. Other: No ascites or focal fluid collections. Multiple peritoneal lesions are again noted. Exophytic, low-density structure arising off the inferior tip of spleen measures 1.1 cm, image 69/4. Unchanged. The left adnexal lesion measures 1.5 x 1.3 cm, image 63/2. Unchanged. Peritoneal nodule within the left lower quadrant  measures 1.4 by 1.0 cm, image 65/2. Previously 1.0 x 0.8 cm. Anterior left pelvis nodule measures 1.9 x 1.7 cm, image 77/2. Previously 3.0 by 2.9 cm. Cystic lesion within the hepatorenal recess measures 3.7 x 3.2 cm, image 26/2. This is compared with 3.9 x 3.4  cm previously. Status post ventral hernia repair. Along the inferior margin of the hernia mesh there is of residual, ventral midline hernia which contains a nonobstructed loop of small bowel, image 76/2. Unchanged from previous exam. Musculoskeletal: No acute or significant osseous findings. IMPRESSION: 1. Overall, exam is stable. Multiple peritoneal nodules are again seen. The index nodule in the left lower quadrant is mildly increased in size in the interval. The lesion within the anterior left pelvis has decreased in size in the interval. There has also been decrease in size of cystic lesion within the a hepatorenal recess. The remaining peritoneal lesions are unchanged. No new lesions identified. 2. Stable left adrenal nodule. 3.  Aortic Atherosclerosis (ICD10-I70.0). Electronically Signed   By: Kerby Moors M.D.   On: 04/17/2020 15:46

## 2020-04-26 NOTE — Assessment & Plan Note (Signed)
She has intermittent elevated renal function likely secondary to dehydration We discussed the importance of aggressive risk factor management 

## 2020-04-26 NOTE — Assessment & Plan Note (Signed)
She is noted to have slightly elevated blood pressure but could be due to anxiety Monitor closely for now

## 2020-04-26 NOTE — Assessment & Plan Note (Signed)
We discussed the goals of care We discussed the rationale behind additional antiestrogen therapy She is in agreement to proceed She is aware that treatment goal is palliative

## 2020-05-02 ENCOUNTER — Ambulatory Visit (INDEPENDENT_AMBULATORY_CARE_PROVIDER_SITE_OTHER): Payer: 59 | Admitting: Family Medicine

## 2020-05-03 ENCOUNTER — Other Ambulatory Visit: Payer: Self-pay | Admitting: Hematology and Oncology

## 2020-05-04 ENCOUNTER — Encounter: Payer: Self-pay | Admitting: Hematology and Oncology

## 2020-05-04 ENCOUNTER — Inpatient Hospital Stay: Payer: 59

## 2020-05-04 ENCOUNTER — Other Ambulatory Visit: Payer: Self-pay

## 2020-05-04 DIAGNOSIS — N183 Chronic kidney disease, stage 3 unspecified: Secondary | ICD-10-CM | POA: Diagnosis not present

## 2020-05-04 DIAGNOSIS — C569 Malignant neoplasm of unspecified ovary: Secondary | ICD-10-CM

## 2020-05-04 DIAGNOSIS — M810 Age-related osteoporosis without current pathological fracture: Secondary | ICD-10-CM | POA: Diagnosis not present

## 2020-05-04 DIAGNOSIS — Z7189 Other specified counseling: Secondary | ICD-10-CM

## 2020-05-04 DIAGNOSIS — Z5112 Encounter for antineoplastic immunotherapy: Secondary | ICD-10-CM | POA: Diagnosis not present

## 2020-05-04 DIAGNOSIS — I10 Essential (primary) hypertension: Secondary | ICD-10-CM | POA: Diagnosis not present

## 2020-05-04 LAB — CBC WITH DIFFERENTIAL (CANCER CENTER ONLY)
Abs Immature Granulocytes: 0.01 10*3/uL (ref 0.00–0.07)
Basophils Absolute: 0 10*3/uL (ref 0.0–0.1)
Basophils Relative: 0 %
Eosinophils Absolute: 0.2 10*3/uL (ref 0.0–0.5)
Eosinophils Relative: 4 %
HCT: 43.2 % (ref 36.0–46.0)
Hemoglobin: 14.8 g/dL (ref 12.0–15.0)
Immature Granulocytes: 0 %
Lymphocytes Relative: 36 %
Lymphs Abs: 2.2 10*3/uL (ref 0.7–4.0)
MCH: 31.4 pg (ref 26.0–34.0)
MCHC: 34.3 g/dL (ref 30.0–36.0)
MCV: 91.5 fL (ref 80.0–100.0)
Monocytes Absolute: 0.4 10*3/uL (ref 0.1–1.0)
Monocytes Relative: 7 %
Neutro Abs: 3.2 10*3/uL (ref 1.7–7.7)
Neutrophils Relative %: 53 %
Platelet Count: 270 10*3/uL (ref 150–400)
RBC: 4.72 MIL/uL (ref 3.87–5.11)
RDW: 11.8 % (ref 11.5–15.5)
WBC Count: 6 10*3/uL (ref 4.0–10.5)
nRBC: 0 % (ref 0.0–0.2)

## 2020-05-04 LAB — COMPREHENSIVE METABOLIC PANEL
ALT: 23 U/L (ref 0–44)
AST: 22 U/L (ref 15–41)
Albumin: 4.1 g/dL (ref 3.5–5.0)
Alkaline Phosphatase: 60 U/L (ref 38–126)
Anion gap: 10 (ref 5–15)
BUN: 24 mg/dL — ABNORMAL HIGH (ref 8–23)
CO2: 27 mmol/L (ref 22–32)
Calcium: 9.5 mg/dL (ref 8.9–10.3)
Chloride: 103 mmol/L (ref 98–111)
Creatinine, Ser: 1.29 mg/dL — ABNORMAL HIGH (ref 0.44–1.00)
GFR, Estimated: 47 mL/min — ABNORMAL LOW (ref >60)
Glucose, Bld: 100 mg/dL — ABNORMAL HIGH (ref 70–99)
Potassium: 4.4 mmol/L (ref 3.5–5.1)
Sodium: 140 mmol/L (ref 135–145)
Total Bilirubin: 0.6 mg/dL (ref 0.3–1.2)
Total Protein: 7.5 g/dL (ref 6.5–8.1)

## 2020-05-04 LAB — TOTAL PROTEIN, URINE DIPSTICK: Protein, ur: NEGATIVE mg/dL

## 2020-05-05 ENCOUNTER — Inpatient Hospital Stay: Payer: 59

## 2020-05-05 ENCOUNTER — Other Ambulatory Visit: Payer: Self-pay

## 2020-05-05 ENCOUNTER — Telehealth: Payer: Self-pay | Admitting: Hematology and Oncology

## 2020-05-05 VITALS — BP 127/49 | HR 58 | Temp 98.0°F | Resp 18 | Wt 193.4 lb

## 2020-05-05 DIAGNOSIS — Z5112 Encounter for antineoplastic immunotherapy: Secondary | ICD-10-CM | POA: Diagnosis not present

## 2020-05-05 DIAGNOSIS — N183 Chronic kidney disease, stage 3 unspecified: Secondary | ICD-10-CM | POA: Diagnosis not present

## 2020-05-05 DIAGNOSIS — M810 Age-related osteoporosis without current pathological fracture: Secondary | ICD-10-CM | POA: Diagnosis not present

## 2020-05-05 DIAGNOSIS — Z7189 Other specified counseling: Secondary | ICD-10-CM

## 2020-05-05 DIAGNOSIS — C569 Malignant neoplasm of unspecified ovary: Secondary | ICD-10-CM

## 2020-05-05 DIAGNOSIS — I10 Essential (primary) hypertension: Secondary | ICD-10-CM | POA: Diagnosis not present

## 2020-05-05 MED ORDER — SODIUM CHLORIDE 0.9 % IV SOLN
15.0000 mg/kg | INTRAVENOUS | Status: DC
  Administered 2020-05-05: 1300 mg via INTRAVENOUS
  Filled 2020-05-05: qty 48

## 2020-05-05 MED ORDER — SODIUM CHLORIDE 0.9 % IV SOLN
Freq: Once | INTRAVENOUS | Status: AC
  Filled 2020-05-05: qty 250

## 2020-05-05 NOTE — Telephone Encounter (Signed)
Spoke with patient about 1/4 sch msg :   Scheduling Message Entered by [facility], [place] on 04/25/2020 at 10:08 AM Priority: Routine EST PT 20  Department: [facility]COL[facility]  Provider: Heath Lark, MD  Scheduling Notes:  Labs and avastin only next week, no need to see me  In about 3 weeks, labs and see me one day, avastin next day      Per patient they will call back to schedule 3 wk apts.

## 2020-05-05 NOTE — Patient Instructions (Signed)
[facility] Discharge Instructions for Patients Receiving Chemotherapy  Today you received the following chemotherapy agents: bevacizumab  To help prevent nausea and vomiting after your treatment, we encourage you to take your nausea medication as directed.   If you develop nausea and vomiting that is not controlled by your nausea medication, call the clinic.   BELOW ARE SYMPTOMS THAT SHOULD BE REPORTED IMMEDIATELY:  *FEVER GREATER THAN 100.5 F  *CHILLS WITH OR WITHOUT FEVER  NAUSEA AND VOMITING THAT IS NOT CONTROLLED WITH YOUR NAUSEA MEDICATION  *UNUSUAL SHORTNESS OF BREATH  *UNUSUAL BRUISING OR BLEEDING  TENDERNESS IN MOUTH AND THROAT WITH OR WITHOUT PRESENCE OF ULCERS  *URINARY PROBLEMS  *BOWEL PROBLEMS  UNUSUAL RASH Items with * indicate a potential emergency and should be followed up as soon as possible.  Feel free to call the clinic should you have any questions or concerns. The clinic phone number is [phone].  Please show the CHEMO ALERT CARD at check-in to the Emergency Department and triage nurse.   

## 2020-05-09 LAB — INHIBIN B: Inhibin B: 112 pg/mL — ABNORMAL HIGH (ref 0.0–16.9)

## 2020-05-11 ENCOUNTER — Other Ambulatory Visit (INDEPENDENT_AMBULATORY_CARE_PROVIDER_SITE_OTHER): Payer: Self-pay | Admitting: Family Medicine

## 2020-05-11 DIAGNOSIS — F3289 Other specified depressive episodes: Secondary | ICD-10-CM

## 2020-05-11 NOTE — Telephone Encounter (Signed)
Dr Ukleja pt °

## 2020-05-24 ENCOUNTER — Inpatient Hospital Stay: Payer: 59

## 2020-05-24 ENCOUNTER — Encounter: Payer: Self-pay | Admitting: Hematology and Oncology

## 2020-05-24 ENCOUNTER — Inpatient Hospital Stay: Payer: 59 | Attending: Hematology | Admitting: Hematology and Oncology

## 2020-05-24 ENCOUNTER — Other Ambulatory Visit (HOSPITAL_COMMUNITY): Payer: Self-pay | Admitting: Gastroenterology

## 2020-05-24 ENCOUNTER — Other Ambulatory Visit: Payer: Self-pay

## 2020-05-24 DIAGNOSIS — E860 Dehydration: Secondary | ICD-10-CM | POA: Diagnosis not present

## 2020-05-24 DIAGNOSIS — C569 Malignant neoplasm of unspecified ovary: Secondary | ICD-10-CM | POA: Insufficient documentation

## 2020-05-24 DIAGNOSIS — E669 Obesity, unspecified: Secondary | ICD-10-CM | POA: Diagnosis not present

## 2020-05-24 DIAGNOSIS — Z800 Family history of malignant neoplasm of digestive organs: Secondary | ICD-10-CM | POA: Diagnosis not present

## 2020-05-24 DIAGNOSIS — Z9221 Personal history of antineoplastic chemotherapy: Secondary | ICD-10-CM | POA: Insufficient documentation

## 2020-05-24 DIAGNOSIS — Z1211 Encounter for screening for malignant neoplasm of colon: Secondary | ICD-10-CM | POA: Diagnosis not present

## 2020-05-24 DIAGNOSIS — K219 Gastro-esophageal reflux disease without esophagitis: Secondary | ICD-10-CM | POA: Diagnosis not present

## 2020-05-24 DIAGNOSIS — K5901 Slow transit constipation: Secondary | ICD-10-CM | POA: Diagnosis not present

## 2020-05-24 DIAGNOSIS — N183 Chronic kidney disease, stage 3 unspecified: Secondary | ICD-10-CM | POA: Insufficient documentation

## 2020-05-24 DIAGNOSIS — Z7189 Other specified counseling: Secondary | ICD-10-CM

## 2020-05-24 DIAGNOSIS — Z6831 Body mass index (BMI) 31.0-31.9, adult: Secondary | ICD-10-CM | POA: Diagnosis not present

## 2020-05-24 DIAGNOSIS — Z79899 Other long term (current) drug therapy: Secondary | ICD-10-CM | POA: Diagnosis not present

## 2020-05-24 DIAGNOSIS — Z7982 Long term (current) use of aspirin: Secondary | ICD-10-CM | POA: Diagnosis not present

## 2020-05-24 LAB — TOTAL PROTEIN, URINE DIPSTICK: Protein, ur: NEGATIVE mg/dL

## 2020-05-24 LAB — COMPREHENSIVE METABOLIC PANEL
ALT: 24 U/L (ref 0–44)
AST: 21 U/L (ref 15–41)
Albumin: 4.1 g/dL (ref 3.5–5.0)
Alkaline Phosphatase: 58 U/L (ref 38–126)
Anion gap: 8 (ref 5–15)
BUN: 25 mg/dL — ABNORMAL HIGH (ref 8–23)
CO2: 27 mmol/L (ref 22–32)
Calcium: 9.6 mg/dL (ref 8.9–10.3)
Chloride: 104 mmol/L (ref 98–111)
Creatinine, Ser: 1.21 mg/dL — ABNORMAL HIGH (ref 0.44–1.00)
GFR, Estimated: 50 mL/min — ABNORMAL LOW (ref >60)
Glucose, Bld: 102 mg/dL — ABNORMAL HIGH (ref 70–99)
Potassium: 4.6 mmol/L (ref 3.5–5.1)
Sodium: 139 mmol/L (ref 135–145)
Total Bilirubin: 0.5 mg/dL (ref 0.3–1.2)
Total Protein: 7.3 g/dL (ref 6.5–8.1)

## 2020-05-24 LAB — CBC WITH DIFFERENTIAL (CANCER CENTER ONLY)
Abs Immature Granulocytes: 0.01 10*3/uL (ref 0.00–0.07)
Basophils Absolute: 0.1 10*3/uL (ref 0.0–0.1)
Basophils Relative: 1 %
Eosinophils Absolute: 0.2 10*3/uL (ref 0.0–0.5)
Eosinophils Relative: 3 %
HCT: 43.6 % (ref 36.0–46.0)
Hemoglobin: 15 g/dL (ref 12.0–15.0)
Immature Granulocytes: 0 %
Lymphocytes Relative: 31 %
Lymphs Abs: 1.5 10*3/uL (ref 0.7–4.0)
MCH: 31.3 pg (ref 26.0–34.0)
MCHC: 34.4 g/dL (ref 30.0–36.0)
MCV: 90.8 fL (ref 80.0–100.0)
Monocytes Absolute: 0.5 10*3/uL (ref 0.1–1.0)
Monocytes Relative: 10 %
Neutro Abs: 2.7 10*3/uL (ref 1.7–7.7)
Neutrophils Relative %: 55 %
Platelet Count: 282 10*3/uL (ref 150–400)
RBC: 4.8 MIL/uL (ref 3.87–5.11)
RDW: 11.5 % (ref 11.5–15.5)
WBC Count: 4.9 10*3/uL (ref 4.0–10.5)
nRBC: 0 % (ref 0.0–0.2)

## 2020-05-24 NOTE — Assessment & Plan Note (Signed)
She is concerned about missing her screening colonoscopy We will cancel her treatment tomorrow She has appointment scheduled to see gastroenterologist to get the colonoscopy scheduled I recommend minimum 6 weeks interval between last treatment to the date of colonoscopy She can resume within 24 to 48 hours after colonoscopy provided no excessive biopsy was performed

## 2020-05-24 NOTE — Assessment & Plan Note (Signed)
She is not consistently taking her medicine that was prescribed for weight loss I have noted that the patient has gained some weight We discussed the importance of lifestyle modification and weight loss in her condition

## 2020-05-24 NOTE — Progress Notes (Signed)
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[facility]) She is concerned about missing her screening colonoscopy We will cancel her treatment tomorrow She has appointment scheduled to see gastroenterologist to get the colonoscopy scheduled I recommend minimum 6 weeks interval between last treatment to the date of colonoscopy She can resume within 24 to 48 hours after colonoscopy provided no excessive biopsy was performed  CKD (chronic kidney disease), stage III She has intermittent elevated renal function likely secondary to dehydration We discussed the importance of aggressive risk factor management  Class 1 obesity with serious comorbidity and body mass index (BMI) of 31.0 to 31.9 in adult She is not consistently taking her medicine that was prescribed for weight loss I have noted that the patient has gained some weight We discussed the importance of lifestyle modification and weight loss in her condition   No orders of the defined types were placed in this encounter.   All questions were answered. The patient knows to call the clinic with any problems, questions or concerns. The total time spent in the appointment was 20 minutes encounter with patients including review of chart and various tests results, discussions about plan of care and coordination of care plan   Heath Lark, MD 05/24/2020 11:12 AM  INTERVAL HISTORY: Please see below for problem oriented charting. She returns for further follow-up She is compliant taking Aromasin as directed She would like to defer her bevacizumab treatment tomorrow in anticipation for colonoscopy She tolerated treatment well except for some mild weight gain Denies recent hot flashes or mood swings Her blood pressure is well controlled She denies abdominal symptoms such as nausea, changes in bowel habits or  bloating  SUMMARY OF ONCOLOGIC HISTORY: Oncology History Overview Note  2018 - Core biopsy for ER/PR and Foundation One testing performed. Unfortunately, no sufficient tissue for Foundation One testing. ER was 50% and PR 90%. Progressed on carboplatin, Tamoxifen/Megace and letrozole, mixed response on Lupron  AMH: 06/23/19: 108 05/26/19: 9.04 01/05/19: 6.84 09/02/18: 3.72 06/01/18: 3.84 02/24/18: 2.6 11/21/17: 3.26 08/26/17: 1.77 05/27/17: 2.12 01/28/17: 2.47 06/10/16: 2.68 02/06/16: 1.84 05/12/15: 3.27 10/03/14: 2.12  Inhibin B 09/22/2019: 95.9 05/26/19: 90.2 01/05/19: 92 09/02/18: 70.9 06/04/18: 70 02/24/18: 79.5 11/21/17: 85.8 08/26/17: 65.6 05/27/17: 58.9 01/28/17: 198 06/10/16: 118.1 02/06/16: 62.4 05/12/15: 64.6 10/03/14: 58   Malignant granulosa cell tumor of ovary ([place])  1994 Initial Diagnosis   1994   2002 Relapse/Recurrence   Upper abdominal recurrence, resected.     - 09/2000 Chemotherapy   6 cycles of IP cisplatin and etoposide    02/2009 Relapse/Recurrence   CT showed increased size of nodules in pelvis   2010 Surgery   Exlap with section of tumor nodules near cecum and left pelvic sidewall. Tumor: ER negative, PR positive    Treatment Plan Change   Alternated 2 week courses of Megace and Tamoxifen - ended 03/2012   03/2012 PET scan   CT - progressive disease   2013 Treatment Plan Change   Letrozole   01/2016 Imaging   MRI showed progressive disease   2017 Treatment Plan Change   Two weeks of alternating Tamoxifen 23m daily and then Megace 467mTID   08/2016 Imaging   MRI showed progressive disease with peritoneal implants near liver, in pelvis   01/2017 Treatment Plan Change   Lupron 11.25 q 3 months   11/2017 Imaging  Overall mixed response.   Mixed cystic/solid lesions in the left pelvis are mildly improved.   Cystic peritoneal disease, including the dominant lesion along the posterior right hepatic lobe, is mildly progressed.   Subcapsular lesion along  the posterior right hepatic lobe is unchanged.   03/2018 Imaging   MRI A/P: Mixed response of individual peritoneal metastases in the pelvis, as described above. Overall, there has been no significant change in bulk of disease.   Stable cystic peritoneal metastatic disease along the capsular surfaces of the liver and spleen.   No new sites of metastatic disease identified within the abdomen or pelvis.   08/2018 Imaging   MRI A/P: Status post hysterectomy and bilateral salpingo-oophorectomy.   Mixed cystic/solid peritoneal implants in the abdomen/pelvis, as above. Dominant cystic implant along the posterior liver surface is mildly increased. Remaining lesions are overall grossly unchanged.   No new lesions are identified.   01/28/2019 Imaging   Mri A/P: 1. Relatively similar appearance of peritoneal metastasis. A posterior right hepatic capsular based lesion is similar to minimally decreased in size. Left pelvic implants are primarily similar with possible enlargement of an anterior high left pelvic cystic implant. No new disease identified. 2.  Aortic Atherosclerosis (ICD10-I70.0). 3. Hepatic steatosis. 4. Left adrenal adenoma.   06/02/2019 Imaging   MRI 1. Potential slight enlargement of dominant cystic area and solid component, associated with rind like signal variation on T2 along the inferior right hepatic margin, also potentially slightly increased. Findings may still be within the realm of measurement and technical variation. Close attention on follow-up. 2. Subtle cystic changes along the cephalad margin of the spleen are difficult to see on previous imaging, perhaps new compared with prior imaging studies. 3. Pelvic implants and left lower quadrant lesion with similar size, of the area along the left iliac vasculature may be slightly larger than on the prior study. 4. Signs of extensive retroperitoneal and pelvic lymphadenectomy. 5. Hepatic steatosis. 6. Left adrenal adenoma  along with stable appearance of Bosniak 2 lesion in the left kidney.     06/08/2019 Cancer Staging   Staging form: [place], AJCC 7th Edition - Clinical: Stage IIIC (rT2, N1, M0) - Signed by Heath Lark, MD on 06/08/2019   06/10/2019 Imaging   1. 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 These findings are slightly worsened in comparison to a remote prior CT examination dated 10/04/2014.   2.  No evidence of metastatic disease in the chest.   3. Status post hysterectomy, pelvic and retroperitoneal lymph node dissection, and ventral hernia mesh repair.   4.  Hepatic steatosis.   5.  Aortic Atherosclerosis (ICD10-I70.0).   06/24/2019 - 09/02/2019 Chemotherapy   The patient had carboplatin for chemotherapy treatment.     09/02/2019 Tumor Marker   Patient's tumor was tested for the following markers: Inhibin B Results of the tumor marker test revealed 94   09/23/2019 Imaging   1. Interval progression of the soft tissue lesions in the anterior left pelvis, likely peritoneal implants, compatible with disease progression. Remaining sites of apparent disease along the liver capsule and posterior spleen are stable 2. Stable 2 cm left adrenal adenoma. 3. Hepatic steatosis. 4. Right-side predominant colonic diverticulosis without diverticulitis. 5. Aortic Atherosclerosis (ICD10-I70.0).   12/23/2019 Imaging   1. Slight interval increase in size of a mixed solid and cystic  nodule in the left hemipelvis measuring 3.0 x 2.9 cm, previously 2.7 x 2.1 cm. 2. Interval decrease in size of a nodule in the left paracolic gutter measuring 1.0 x 0.8 cm, previously 2.1 x 1.6 cm. 3. Stable peritoneal nodules in the hepatorenal recess and at the inferior tip of the spleen. 4. Unchanged nodule or lymph node overlying the left external iliac  artery. 5. Enlargement of dominant left pelvic nodule is concerning for disease progression despite interval decrease in size of a nodule in the left paracolic gutter and stability of other nodules. 6. No evidence of metastatic disease in the chest. 7. Stable, benign left adrenal adenoma. 8. Ventral hernia mesh repair with a small component of recurrent hernia inferiorly, containing a single nonobstructed loop of small bowel. 9. Hepatic steatosis. 10. Aortic Atherosclerosis (ICD10-I70.0).   12/23/2019 Tumor Marker   Patient's tumor was tested for the following markers: Inhibin B Results of the tumor marker test revealed 94.9   01/25/2020 Tumor Marker   Patient's tumor was tested for the following markers: Inhibin B Results of the tumor marker test revealed 116.7   02/20/2020 Genetic Testing   Negative genetic testing: no pathogenic variants detected in [facility] Multi-Cancer Panel.  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  The report date is February 20, 2020.   The Multi-Cancer Panel offered by [facility] includes sequencing and/or deletion duplication testing of the following 85 genes: AIP, ALK, APC, ATM, AXIN2,BAP1,  BARD1, BLM, BMPR1A, BRCA1, BRCA2, BRIP1, CASR, CDC73, CDH1, CDK4, CDKN1B, CDKN1C, CDKN2A (p14ARF), CDKN2A (p16INK4a), CEBPA, CHEK2, CTNNA1, DICER1, DIS3L2, EGFR (c.2369C>T, p.Thr790Met variant only), EPCAM (Deletion/duplication testing only), FH, FLCN, GATA2, GPC3, GREM1 (Promoter region deletion/duplication testing only), HOXB13 (c.251G>A, p.Gly84Glu), HRAS, KIT, MAX, MEN1, MET, MITF (c.952G>A, p.Glu318Lys variant only), MLH1, MSH2, MSH3, MSH6, MUTYH, NBN, NF1, NF2, NTHL1, PALB2, PDGFRA, PHOX2B, PMS2, POLD1, POLE, POT1, PRKAR1A, PTCH1, PTEN, RAD50, RAD51C, RAD51D, RB1, RECQL4, RET, RNF43, RUNX1, SDHAF2, SDHA (sequence changes only), SDHB, SDHC, SDHD, SMAD4, SMARCA4, SMARCB1, SMARCE1, STK11, SUFU, TERC, TERT, TMEM127, TP53, TSC1, TSC2, VHL, WRN and WT1.     03/02/2020 Tumor Marker   Patient's tumor was tested for the following markers: Inhibin B Results of the tumor marker test revealed 80.8   04/17/2020 Imaging   1. Overall, exam is stable. Multiple peritoneal nodules are again seen. The index nodule in the left lower quadrant is mildly increased in size in the interval. The lesion within the anterior left pelvis has decreased in size in the interval. There has also been decrease in size of cystic lesion within the a hepatorenal recess. The remaining peritoneal lesions are unchanged. No new lesions identified. 2. Stable left adrenal nodule. 3.  Aortic Atherosclerosis (ICD10-I70.0).       REVIEW OF SYSTEMS:   Constitutional: Denies fevers, chills or abnormal weight loss Eyes: Denies blurriness of vision Ears, nose, mouth, throat, and face: Denies mucositis or sore throat Respiratory: Denies cough, dyspnea or wheezes Cardiovascular: Denies palpitation, chest discomfort or lower extremity swelling Gastrointestinal:  Denies nausea, heartburn or change in bowel habits Skin: Denies abnormal skin rashes Lymphatics: Denies new lymphadenopathy or easy bruising Neurological:Denies numbness, tingling or new weaknesses Behavioral/Psych: Mood is stable, no new changes  All other systems were reviewed with the patient and are negative.  I have reviewed the past medical history, past surgical history, social history and family history with the patient and they are unchanged from previous note.  ALLERGIES:  is allergic to codeine, oxycodone, sodium thiosalicylate, ciprofloxacin, codone [hydrocodone  bitartrate], daypro [oxaprozin], levofloxacin, stadol [butorphanol tartrate], and sulfa antibiotics.  MEDICATIONS:  Current Outpatient Medications  Medication Sig Dispense Refill  . aspirin 325 MG tablet Take 325 mg by mouth daily.    . Bevacizumab (AVASTIN IV) Inject into the vein. Every 3 weeks    . Biotin 10 MG CAPS Take by mouth.    Marland Kitchen buPROPion  (WELLBUTRIN XL) 300 MG 24 hr tablet Take 1 tablet (300 mg total) by mouth daily. 30 tablet 0  . Calcium Citrate 200 MG TABS Take by mouth.    . escitalopram (LEXAPRO) 20 MG tablet Take 10 mg by mouth daily.     Marland Kitchen exemestane (AROMASIN) 25 MG tablet Take 1 tablet (25 mg total) by mouth daily after breakfast. 90 tablet 11  . metFORMIN (GLUCOPHAGE) 1000 MG tablet 1/2 tab BID with meals 60 tablet 0  . mirabegron ER (MYRBETRIQ) 25 MG TB24 tablet Take 25 mg by mouth daily.    . Multiple Vitamin (MULTIVITAMIN) capsule Take 1 capsule by mouth daily.    Marland Kitchen omeprazole (PRILOSEC) 20 MG capsule Take 20 mg by mouth daily.    . Probiotic Product (ALIGN PO) Take 1 tablet by mouth daily.     . Vitamin D, Ergocalciferol, (DRISDOL) 1.25 MG (50000 UNIT) CAPS capsule Take 1 capsule (50,000 Units total) by mouth every 14 (fourteen) days. 4 capsule 0   No current facility-administered medications for this visit.    PHYSICAL EXAMINATION: ECOG PERFORMANCE STATUS: 1 - Symptomatic but completely ambulatory  Vitals:   05/24/20 0955  BP: 133/84  Pulse: (!) 51  Resp: 17  Temp: (!) 97.3 F (36.3 C)  SpO2: 99%   Filed Weights   05/24/20 0955  Weight: 196 lb 1.6 oz (89 kg)    GENERAL:alert, no distress and comfortable NEURO: alert & oriented x 3 with fluent speech, no focal motor/sensory deficits  LABORATORY DATA:  I have reviewed the data as listed    Component Value Date/Time   NA 139 05/24/2020 0928   NA 139 11/30/2019 0913   NA 141 09/24/2013 1131   K 4.6 05/24/2020 0928   K 4.5 09/24/2013 1131   CL 104 05/24/2020 0928   CO2 27 05/24/2020 0928   CO2 24 09/24/2013 1131   GLUCOSE 102 (H) 05/24/2020 0928   GLUCOSE 90 09/24/2013 1131   BUN 25 (H) 05/24/2020 0928   BUN 32 (H) 11/30/2019 0913   BUN 21.2 09/24/2013 1131   CREATININE 1.21 (H) 05/24/2020 0928   CREATININE 1.18 (H) 12/21/2019 1259   CREATININE 1.1 09/24/2013 1131   CALCIUM 9.6 05/24/2020 0928   CALCIUM 9.5 09/24/2013 1131   PROT 7.3  05/24/2020 0928   PROT 7.0 11/30/2019 0913   PROT 6.8 09/24/2013 1131   ALBUMIN 4.1 05/24/2020 0928   ALBUMIN 4.3 11/30/2019 0913   ALBUMIN 3.9 09/24/2013 1131   AST 21 05/24/2020 0928   AST 18 12/21/2019 1259   AST 23 09/24/2013 1131   ALT 24 05/24/2020 0928   ALT 18 12/21/2019 1259   ALT 27 09/24/2013 1131   ALKPHOS 58 05/24/2020 0928   ALKPHOS 81 09/24/2013 1131   BILITOT 0.5 05/24/2020 0928   BILITOT 0.3 12/21/2019 1259   BILITOT 0.53 09/24/2013 1131   GFRNONAA 50 (L) 05/24/2020 0928   GFRNONAA 49 (L) 12/21/2019 1259   GFRAA 55 (L) 01/24/2020 1530   GFRAA 57 (L) 12/21/2019 1259    No results found for: SPEP, UPEP  Lab Results  Component Value Date  WBC 4.9 05/24/2020   NEUTROABS 2.7 05/24/2020   HGB 15.0 05/24/2020   HCT 43.6 05/24/2020   MCV 90.8 05/24/2020   PLT 282 05/24/2020      Chemistry      Component Value Date/Time   NA 139 05/24/2020 0928   NA 139 11/30/2019 0913   NA 141 09/24/2013 1131   K 4.6 05/24/2020 0928   K 4.5 09/24/2013 1131   CL 104 05/24/2020 0928   CO2 27 05/24/2020 0928   CO2 24 09/24/2013 1131   BUN 25 (H) 05/24/2020 0928   BUN 32 (H) 11/30/2019 0913   BUN 21.2 09/24/2013 1131   CREATININE 1.21 (H) 05/24/2020 0928   CREATININE 1.18 (H) 12/21/2019 1259   CREATININE 1.1 09/24/2013 1131      Component Value Date/Time   CALCIUM 9.6 05/24/2020 0928   CALCIUM 9.5 09/24/2013 1131   ALKPHOS 58 05/24/2020 0928   ALKPHOS 81 09/24/2013 1131   AST 21 05/24/2020 0928   AST 18 12/21/2019 1259   AST 23 09/24/2013 1131   ALT 24 05/24/2020 0928   ALT 18 12/21/2019 1259   ALT 27 09/24/2013 1131   BILITOT 0.5 05/24/2020 0928   BILITOT 0.3 12/21/2019 1259   BILITOT 0.53 09/24/2013 1131

## 2020-05-24 NOTE — Assessment & Plan Note (Signed)
She has intermittent elevated renal function likely secondary to dehydration We discussed the importance of aggressive risk factor management

## 2020-05-25 ENCOUNTER — Telehealth (INDEPENDENT_AMBULATORY_CARE_PROVIDER_SITE_OTHER): Payer: 59 | Admitting: Family Medicine

## 2020-05-25 ENCOUNTER — Encounter (INDEPENDENT_AMBULATORY_CARE_PROVIDER_SITE_OTHER): Payer: Self-pay

## 2020-05-25 ENCOUNTER — Inpatient Hospital Stay: Payer: 59

## 2020-05-25 ENCOUNTER — Encounter (INDEPENDENT_AMBULATORY_CARE_PROVIDER_SITE_OTHER): Payer: Self-pay | Admitting: Family Medicine

## 2020-05-25 ENCOUNTER — Other Ambulatory Visit (INDEPENDENT_AMBULATORY_CARE_PROVIDER_SITE_OTHER): Payer: Self-pay | Admitting: Family Medicine

## 2020-05-25 DIAGNOSIS — E8881 Metabolic syndrome: Secondary | ICD-10-CM | POA: Diagnosis not present

## 2020-05-25 DIAGNOSIS — E669 Obesity, unspecified: Secondary | ICD-10-CM | POA: Diagnosis not present

## 2020-05-25 DIAGNOSIS — F3289 Other specified depressive episodes: Secondary | ICD-10-CM | POA: Diagnosis not present

## 2020-05-25 DIAGNOSIS — R7303 Prediabetes: Secondary | ICD-10-CM

## 2020-05-25 DIAGNOSIS — Z6830 Body mass index (BMI) 30.0-30.9, adult: Secondary | ICD-10-CM | POA: Diagnosis not present

## 2020-05-25 MED ORDER — METFORMIN HCL 1000 MG PO TABS
ORAL_TABLET | ORAL | 0 refills | Status: DC
Start: 2020-05-25 — End: 2020-05-25

## 2020-05-25 MED ORDER — BUPROPION HCL ER (XL) 300 MG PO TB24: 300.0000 mg | ORAL_TABLET | Freq: Every day | ORAL | 0 refills | Status: DC

## 2020-05-25 MED FILL — buPROPion HCL ER (XL) 300 M: 300 | 30 days supply | Qty: 30 | Fill #0

## 2020-05-25 MED FILL — METFORMIN HCL 1000 MG TABS: 1000 | 60 days supply | Qty: 60 | Fill #0

## 2020-05-29 ENCOUNTER — Encounter: Payer: Self-pay | Admitting: Hematology and Oncology

## 2020-05-29 NOTE — Progress Notes (Signed)
TeleHealth Visit:  Due to the COVID-19 pandemic, this visit was completed with telemedicine (audio/video) technology to reduce patient and provider exposure as well as to preserve personal protective equipment.   [name] has verbally consented to this TeleHealth visit. The patient is located at home, the provider is located at the [facility] office. The participants in this visit include the listed provider and patient. The visit was conducted today via video.  Chief Complaint: OBESITY [name] is here to discuss her progress with her obesity treatment plan along with follow-up of her obesity related diagnoses. [name] is on the [employer] and states she is following her eating plan approximately 60% of the time. [name] states she is going to the gym 60 minutes 2 times per week.  Today's visit was #: 35 Starting weight: 209 lbs Starting date: 04/29/2018  Interim History: Pt reports a weight of 196 lbs yesterday. She has been following the plan 60% of the time secondary to increased cravings and desires to snack. She is eating more indulgent foods of cookies and tater tots. She has started removing some of the indulgent snacks from the house.  Subjective:   1. Insulin resistance Pt is on  Metformin with no GI side effects. Last labs about 5 months ago.  2. Other depression, with emotional eating Pt denies suicidal or homicidal ideations. She reports increased snacking with running out of Wellbutrin.  Assessment/Plan:   1. Insulin resistance Tekelia will continue to work on weight loss, exercise, and decreasing simple carbohydrates to help decrease the risk of diabetes. Leianna agreed to follow-up with [name] as directed to closely monitor her progress. Will need to repeat labs at next OV.  - metFORMIN (GLUCOPHAGE) 1000 MG tablet; 1/2 tab BID with meals  Dispense: 60 tablet; Refill: 0  2. Other depression, with emotional eating Behavior modification techniques were discussed  today to help [name] deal with her emotional/non-hunger eating behaviors.  Orders and follow up as documented in patient record.    - buPROPion (WELLBUTRIN XL) 300 MG 24 hr tablet; Take 1 tablet (300 mg total) by mouth daily.  Dispense: 30 tablet; Refill: 0  3. Class 1 obesity with serious comorbidity and body mass index (BMI) of 30.0 to 30.9 in adult, unspecified obesity type [name] is currently in the action stage of change. As such, her goal is to continue with weight loss efforts. She has agreed to the [employer].   Repeat IC at next appointment.  Exercise goals: No exercise has been prescribed at this time.  Behavioral modification strategies: increasing lean protein intake, meal planning and cooking strategies, keeping healthy foods in the home, better snacking choices and planning for success.  [name] has agreed to follow-up with our clinic in 2 weeks. She was informed of the importance of frequent follow-up visits to maximize her success with intensive lifestyle modifications for her multiple health conditions.  Objective:   VITALS: Per patient if applicable, see vitals. GENERAL: Alert and in no acute distress. CARDIOPULMONARY: No increased WOB. Speaking in clear sentences.  PSYCH: Pleasant and cooperative. Speech normal rate and rhythm. Affect is appropriate. Insight and judgement are appropriate. Attention is focused, linear, and appropriate.  NEURO: Oriented as arrived to appointment on time with no prompting.   Lab Results  Component Value Date   CREATININE 1.21 (H) 05/24/2020   BUN 25 (H) 05/24/2020   NA 139 05/24/2020   K 4.6 05/24/2020   CL 104 05/24/2020   CO2 27 05/24/2020  Lab Results  Component Value Date   ALT 24 05/24/2020   AST 21 05/24/2020   ALKPHOS 58 05/24/2020   BILITOT 0.5 05/24/2020   Lab Results  Component Value Date   HGBA1C 5.2 11/30/2019   HGBA1C 5.3 05/31/2019   HGBA1C 5.2 11/19/2018   HGBA1C 5.5 04/29/2018   Lab Results  Component  Value Date   INSULIN 23.8 11/30/2019   INSULIN 16.5 05/31/2019   INSULIN 17.6 11/19/2018   INSULIN 25.8 (H) 04/29/2018   Lab Results  Component Value Date   TSH 0.707 11/30/2019   Lab Results  Component Value Date   CHOL 224 (H) 11/30/2019   HDL 52 11/30/2019   LDLCALC 136 (H) 11/30/2019   TRIG 201 (H) 11/30/2019   Lab Results  Component Value Date   WBC 4.9 05/24/2020   HGB 15.0 05/24/2020   HCT 43.6 05/24/2020   MCV 90.8 05/24/2020   PLT 282 05/24/2020   No results found for: IRON, TIBC, FERRITIN  Attestation Statements:   Reviewed by clinician on day of visit: allergies, medications, problem list, medical history, surgical history, family history, social history, and previous encounter notes.  Coral Ceo, am acting as transcriptionist for Coralie Common, MD.   I have reviewed the above documentation for accuracy and completeness, and I agree with the above. - Jinny Blossom, MD

## 2020-05-31 ENCOUNTER — Telehealth: Payer: Self-pay

## 2020-05-31 NOTE — Telephone Encounter (Signed)
Called and left a message asking her to call the office back. Does she want to resume treatment on 3/7?

## 2020-06-08 DIAGNOSIS — R4189 Other symptoms and signs involving cognitive functions and awareness: Secondary | ICD-10-CM | POA: Diagnosis not present

## 2020-06-08 DIAGNOSIS — M79642 Pain in left hand: Secondary | ICD-10-CM | POA: Diagnosis not present

## 2020-06-08 DIAGNOSIS — J310 Chronic rhinitis: Secondary | ICD-10-CM | POA: Diagnosis not present

## 2020-06-08 DIAGNOSIS — M436 Torticollis: Secondary | ICD-10-CM | POA: Diagnosis not present

## 2020-06-08 DIAGNOSIS — H9193 Unspecified hearing loss, bilateral: Secondary | ICD-10-CM | POA: Diagnosis not present

## 2020-06-12 MED FILL — PEG-3350 SOLUTION: 420 | 1 days supply | Qty: 4000 | Fill #0

## 2020-06-15 ENCOUNTER — Other Ambulatory Visit: Payer: Self-pay | Admitting: Hematology and Oncology

## 2020-06-15 ENCOUNTER — Ambulatory Visit (INDEPENDENT_AMBULATORY_CARE_PROVIDER_SITE_OTHER): Payer: 59 | Admitting: Family Medicine

## 2020-06-15 ENCOUNTER — Inpatient Hospital Stay: Payer: 59

## 2020-06-15 ENCOUNTER — Other Ambulatory Visit: Payer: Self-pay

## 2020-06-15 ENCOUNTER — Encounter (INDEPENDENT_AMBULATORY_CARE_PROVIDER_SITE_OTHER): Payer: Self-pay | Admitting: Family Medicine

## 2020-06-15 VITALS — BP 126/71 | HR 57 | Temp 97.6°F | Ht 66.0 in | Wt 188.0 lb

## 2020-06-15 DIAGNOSIS — C569 Malignant neoplasm of unspecified ovary: Secondary | ICD-10-CM | POA: Diagnosis not present

## 2020-06-15 DIAGNOSIS — Z9221 Personal history of antineoplastic chemotherapy: Secondary | ICD-10-CM | POA: Diagnosis not present

## 2020-06-15 DIAGNOSIS — E669 Obesity, unspecified: Secondary | ICD-10-CM

## 2020-06-15 DIAGNOSIS — Z9189 Other specified personal risk factors, not elsewhere classified: Secondary | ICD-10-CM | POA: Diagnosis not present

## 2020-06-15 DIAGNOSIS — N183 Chronic kidney disease, stage 3 unspecified: Secondary | ICD-10-CM | POA: Diagnosis not present

## 2020-06-15 DIAGNOSIS — R0602 Shortness of breath: Secondary | ICD-10-CM

## 2020-06-15 DIAGNOSIS — F3289 Other specified depressive episodes: Secondary | ICD-10-CM | POA: Diagnosis not present

## 2020-06-15 DIAGNOSIS — Z7982 Long term (current) use of aspirin: Secondary | ICD-10-CM | POA: Diagnosis not present

## 2020-06-15 DIAGNOSIS — Z6830 Body mass index (BMI) 30.0-30.9, adult: Secondary | ICD-10-CM

## 2020-06-15 DIAGNOSIS — E860 Dehydration: Secondary | ICD-10-CM | POA: Diagnosis not present

## 2020-06-15 DIAGNOSIS — Z79899 Other long term (current) drug therapy: Secondary | ICD-10-CM | POA: Diagnosis not present

## 2020-06-15 MED ORDER — SODIUM CHLORIDE 0.9 % IV SOLN
Freq: Once | INTRAVENOUS | Status: AC
  Filled 2020-06-15: qty 250

## 2020-06-15 MED ORDER — ONDANSETRON HCL 8 MG PO TABS
8.0000 mg | ORAL_TABLET | Freq: Once | ORAL | Status: AC
  Administered 2020-06-15: 8 mg via ORAL

## 2020-06-15 MED ORDER — BUPROPION HCL ER (XL) 300 MG PO TB24: 300.0000 mg | ORAL_TABLET | Freq: Every day | ORAL | 0 refills | Status: DC

## 2020-06-19 NOTE — Progress Notes (Signed)
Chief Complaint:   OBESITY [name] is here to discuss her progress with her obesity treatment plan along with follow-up of her obesity related diagnoses. [name] is on the Category 3 Plan and states she is following her eating plan approximately 50% of the time. [name] states she is aerobics and walking 45-60 minutes 2 times per week.  Today's visit was #: 29 Starting weight: 209 lbs Starting date: 04/29/2018 Today's weight: 188 lbs Today's date: 06/15/2020 Total lbs lost to date: 21 lbs Total lbs lost since last in-office visit: 3 lbs  Interim History: [name] denies any hunger on category 2. She has been on plan 50% of the time and pt voices she is just not eating much after 6 PM. She has a colonoscopy next week. Pt states she realizes she likely isn't eating enough as she isn't eating all of her dinner. Breakfast and lunch are closer to plan. She is often resorting to an Organic shake at dinner.  Subjective:   1. SOB (shortness of breath) on exertion Pt has improved symptoms since initial appointment in 2020. Her last RMR was 1400.  2. Other depression, with emotional eating Pt denies suicidal or homicidal ideations on Wellbutrin. Her BP is well controlled and she has no sleep issues. Her symptoms of cravings is better.  3. At risk for deficient intake of food [name] is at risk for deficient intake of food due to skipping dinner.  Assessment/Plan:   1. SOB (shortness of breath) on exertion [name] does not feel that she gets out of breath more easily that she used to when she exercises. [name]'s shortness of breath appears to be obesity related and exercise induced. She has agreed to work on weight loss and gradually increase exercise to treat her exercise induced shortness of breath. Will continue to monitor closely. IC today.  2. Other depression, with emotional eating Behavior modification techniques were discussed today to help [name] deal with her emotional/non-hunger eating  behaviors.  Orders and follow up as documented in patient record.   - buPROPion (WELLBUTRIN XL) 300 MG 24 hr tablet; Take 1 tablet (300 mg total) by mouth daily.  Dispense: 30 tablet; Refill: 0  3. At risk for deficient intake of food [name] was given approximately 15 minutes of deficit intake of food prevention counseling today. [name] is at risk for eating too few calories based on current food recall. She was encouraged to focus on meeting caloric and protein goals according to her recommended meal plan.   4. Class 1 obesity with serious comorbidity and body mass index (BMI) of 30.0 to 30.9 in adult, unspecified obesity type [name] is currently in the action stage of change. As such, her goal is to continue with weight loss efforts. She has agreed to the Category 3 Plan.   Exercise goals: As is  Behavioral modification strategies: increasing lean protein intake, meal planning and cooking strategies, keeping healthy foods in the home and planning for success.  Ji has agreed to follow-up with our clinic in 3 weeks. She was informed of the importance of frequent follow-up visits to maximize her success with intensive lifestyle modifications for her multiple health conditions.   Objective:   Blood pressure 126/71, pulse (!) 57, temperature 97.6 F (36.4 C), temperature source Oral, height 5\' 6"  (1.676 m), weight 188 lb (85.3 kg), SpO2 97 %. Body mass index is 30.34 kg/m.  General: Cooperative, alert, well developed, in no acute distress. HEENT: Conjunctivae and lids unremarkable. Cardiovascular: Regular rhythm.  Lungs: Normal work of breathing. Neurologic: No focal deficits.   Lab Results  Component Value Date   CREATININE 1.21 (H) 05/24/2020   BUN 25 (H) 05/24/2020   NA 139 05/24/2020   K 4.6 05/24/2020   CL 104 05/24/2020   CO2 27 05/24/2020   Lab Results  Component Value Date   ALT 24 05/24/2020   AST 21 05/24/2020   ALKPHOS 58 05/24/2020   BILITOT 0.5 05/24/2020   Lab  Results  Component Value Date   HGBA1C 5.2 11/30/2019   HGBA1C 5.3 05/31/2019   HGBA1C 5.2 11/19/2018   HGBA1C 5.5 04/29/2018   Lab Results  Component Value Date   INSULIN 23.8 11/30/2019   INSULIN 16.5 05/31/2019   INSULIN 17.6 11/19/2018   INSULIN 25.8 (H) 04/29/2018   Lab Results  Component Value Date   TSH 0.707 11/30/2019   Lab Results  Component Value Date   CHOL 224 (H) 11/30/2019   HDL 52 11/30/2019   LDLCALC 136 (H) 11/30/2019   TRIG 201 (H) 11/30/2019   Lab Results  Component Value Date   WBC 4.9 05/24/2020   HGB 15.0 05/24/2020   HCT 43.6 05/24/2020   MCV 90.8 05/24/2020   PLT 282 05/24/2020    Attestation Statements:   Reviewed by clinician on day of visit: allergies, medications, problem list, medical history, surgical history, family history, social history, and previous encounter notes.  [name], am acting as transcriptionist for Coralie Common, MD.   I have reviewed the above documentation for accuracy and completeness, and I agree with the above. - Jacqueline Blossom, MD

## 2020-06-20 DIAGNOSIS — Z01812 Encounter for preprocedural laboratory examination: Secondary | ICD-10-CM | POA: Diagnosis not present

## 2020-06-21 ENCOUNTER — Other Ambulatory Visit: Payer: Self-pay | Admitting: Hematology and Oncology

## 2020-06-23 DIAGNOSIS — K317 Polyp of stomach and duodenum: Secondary | ICD-10-CM | POA: Diagnosis not present

## 2020-06-23 DIAGNOSIS — K219 Gastro-esophageal reflux disease without esophagitis: Secondary | ICD-10-CM | POA: Diagnosis not present

## 2020-06-23 DIAGNOSIS — Z800 Family history of malignant neoplasm of digestive organs: Secondary | ICD-10-CM | POA: Diagnosis not present

## 2020-06-23 DIAGNOSIS — Z1211 Encounter for screening for malignant neoplasm of colon: Secondary | ICD-10-CM | POA: Diagnosis not present

## 2020-06-26 ENCOUNTER — Encounter: Payer: Self-pay | Admitting: Hematology and Oncology

## 2020-06-26 ENCOUNTER — Telehealth: Payer: Self-pay

## 2020-06-26 NOTE — Telephone Encounter (Signed)
-----   Message from Heath Lark, MD sent at 06/26/2020  1:50 PM EST ----- We can restart anytime infusion room is available Can you call? Labs see me and avastin only

## 2020-06-26 NOTE — Telephone Encounter (Signed)
Called and given below message. She verbalized understanding. She ask that I send a low priority message for appts. She will have to look at schedule prior to scheduling.

## 2020-06-27 DIAGNOSIS — H905 Unspecified sensorineural hearing loss: Secondary | ICD-10-CM | POA: Diagnosis not present

## 2020-07-03 ENCOUNTER — Other Ambulatory Visit (HOSPITAL_COMMUNITY): Payer: Self-pay | Admitting: Internal Medicine

## 2020-07-05 ENCOUNTER — Inpatient Hospital Stay: Payer: 59 | Attending: Hematology | Admitting: Hematology and Oncology

## 2020-07-05 ENCOUNTER — Other Ambulatory Visit (HOSPITAL_COMMUNITY): Payer: Self-pay | Admitting: Internal Medicine

## 2020-07-05 ENCOUNTER — Other Ambulatory Visit: Payer: Self-pay

## 2020-07-05 ENCOUNTER — Inpatient Hospital Stay: Payer: 59

## 2020-07-05 DIAGNOSIS — M2550 Pain in unspecified joint: Secondary | ICD-10-CM

## 2020-07-05 DIAGNOSIS — R635 Abnormal weight gain: Secondary | ICD-10-CM

## 2020-07-05 DIAGNOSIS — C569 Malignant neoplasm of unspecified ovary: Secondary | ICD-10-CM

## 2020-07-05 DIAGNOSIS — Z5112 Encounter for antineoplastic immunotherapy: Secondary | ICD-10-CM | POA: Diagnosis not present

## 2020-07-05 DIAGNOSIS — M79642 Pain in left hand: Secondary | ICD-10-CM

## 2020-07-05 DIAGNOSIS — Z7189 Other specified counseling: Secondary | ICD-10-CM

## 2020-07-05 LAB — COMPREHENSIVE METABOLIC PANEL
ALT: 18 U/L (ref 0–44)
AST: 17 U/L (ref 15–41)
Albumin: 3.9 g/dL (ref 3.5–5.0)
Alkaline Phosphatase: 60 U/L (ref 38–126)
Anion gap: 9 (ref 5–15)
BUN: 25 mg/dL — ABNORMAL HIGH (ref 8–23)
CO2: 27 mmol/L (ref 22–32)
Calcium: 9.4 mg/dL (ref 8.9–10.3)
Chloride: 103 mmol/L (ref 98–111)
Creatinine, Ser: 1.17 mg/dL — ABNORMAL HIGH (ref 0.44–1.00)
GFR, Estimated: 52 mL/min — ABNORMAL LOW (ref >60)
Glucose, Bld: 101 mg/dL — ABNORMAL HIGH (ref 70–99)
Potassium: 4.2 mmol/L (ref 3.5–5.1)
Sodium: 139 mmol/L (ref 135–145)
Total Bilirubin: 0.3 mg/dL (ref 0.3–1.2)
Total Protein: 7.2 g/dL (ref 6.5–8.1)

## 2020-07-05 LAB — CBC WITH DIFFERENTIAL (CANCER CENTER ONLY)
Abs Immature Granulocytes: 0.01 10*3/uL (ref 0.00–0.07)
Basophils Absolute: 0.1 10*3/uL (ref 0.0–0.1)
Basophils Relative: 1 %
Eosinophils Absolute: 0.1 10*3/uL (ref 0.0–0.5)
Eosinophils Relative: 3 %
HCT: 41.3 % (ref 36.0–46.0)
Hemoglobin: 14.2 g/dL (ref 12.0–15.0)
Immature Granulocytes: 0 %
Lymphocytes Relative: 39 %
Lymphs Abs: 2.1 10*3/uL (ref 0.7–4.0)
MCH: 31.7 pg (ref 26.0–34.0)
MCHC: 34.4 g/dL (ref 30.0–36.0)
MCV: 92.2 fL (ref 80.0–100.0)
Monocytes Absolute: 0.5 10*3/uL (ref 0.1–1.0)
Monocytes Relative: 9 %
Neutro Abs: 2.6 10*3/uL (ref 1.7–7.7)
Neutrophils Relative %: 48 %
Platelet Count: 273 10*3/uL (ref 150–400)
RBC: 4.48 MIL/uL (ref 3.87–5.11)
RDW: 11.5 % (ref 11.5–15.5)
WBC Count: 5.3 10*3/uL (ref 4.0–10.5)
nRBC: 0 % (ref 0.0–0.2)

## 2020-07-05 LAB — TOTAL PROTEIN, URINE DIPSTICK: Protein, ur: NEGATIVE mg/dL

## 2020-07-06 ENCOUNTER — Encounter: Payer: Self-pay | Admitting: Hematology and Oncology

## 2020-07-06 ENCOUNTER — Inpatient Hospital Stay: Payer: 59

## 2020-07-06 VITALS — BP 127/54 | HR 57 | Temp 97.5°F | Resp 16

## 2020-07-06 DIAGNOSIS — Z7189 Other specified counseling: Secondary | ICD-10-CM

## 2020-07-06 DIAGNOSIS — Z5112 Encounter for antineoplastic immunotherapy: Secondary | ICD-10-CM | POA: Diagnosis not present

## 2020-07-06 DIAGNOSIS — M2550 Pain in unspecified joint: Secondary | ICD-10-CM | POA: Insufficient documentation

## 2020-07-06 DIAGNOSIS — R635 Abnormal weight gain: Secondary | ICD-10-CM | POA: Insufficient documentation

## 2020-07-06 DIAGNOSIS — C569 Malignant neoplasm of unspecified ovary: Secondary | ICD-10-CM

## 2020-07-06 MED ORDER — SODIUM CHLORIDE 0.9 % IV SOLN
Freq: Once | INTRAVENOUS | Status: AC
  Filled 2020-07-06: qty 250

## 2020-07-06 MED ORDER — SODIUM CHLORIDE 0.9 % IV SOLN
15.0000 mg/kg | Freq: Once | INTRAVENOUS | Status: AC
  Administered 2020-07-06: 1300 mg via INTRAVENOUS
  Filled 2020-07-06: qty 48

## 2020-07-06 NOTE — Assessment & Plan Note (Signed)
She has profound weight gain due to antiestrogen therapy and recent discontinuation of Wellbutrin We discussed the importance of not gaining more weight while on antiestrogen therapy

## 2020-07-06 NOTE — Progress Notes (Signed)
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[facility]) She has delayed treatment due to many issues I recommend minimum 3 doses starting this week before repeat imaging study I have reminded her that she needs to get her appointment scheduled She will continue close monitoring of her blood pressure at home and call me if her blood pressure is uncontrolled  Weight gain She has profound weight gain due to antiestrogen therapy and recent discontinuation of Wellbutrin We discussed the importance of not gaining more weight while on antiestrogen therapy  Joint pain She has diffuse musculoskeletal pain that could certainly be exacerbated by antiestrogen therapy Recommend she continues high-dose vitamin D and graduated exercise as tolerated   No orders of the defined types were placed in this encounter.   All questions were answered. The patient knows to call the clinic with any problems, questions or concerns. The total time spent in the appointment was 20 minutes encounter with patients including review of chart and various tests results, discussions about plan of care and coordination of care plan   Heath Lark, MD 07/06/2020 8:00 AM  INTERVAL HISTORY: Please see below for problem oriented charting. She has completed recent colonoscopy evaluation which was reported as benign She has gained a lot of weight She admits she has not been watching her diet and has discontinued Wellbutrin She has not been monitoring her blood pressure recently but stated it is probably normal  SUMMARY OF ONCOLOGIC HISTORY: Oncology History Overview Note  2018 - Core biopsy for ER/PR and Foundation One testing performed. Unfortunately, no sufficient tissue for Foundation One testing. ER was 50% and PR 90%. Progressed on carboplatin, Tamoxifen/Megace and letrozole, mixed  response on Lupron  AMH: 06/23/19: 108 05/26/19: 9.04 01/05/19: 6.84 09/02/18: 3.72 06/01/18: 3.84 02/24/18: 2.6 11/21/17: 3.26 08/26/17: 1.77 05/27/17: 2.12 01/28/17: 2.47 06/10/16: 2.68 02/06/16: 1.84 05/12/15: 3.27 10/03/14: 2.12  Inhibin B 09/22/2019: 95.9 05/26/19: 90.2 01/05/19: 92 09/02/18: 70.9 06/04/18: 70 02/24/18: 79.5 11/21/17: 85.8 08/26/17: 65.6 05/27/17: 58.9 01/28/17: 198 06/10/16: 118.1 02/06/16: 62.4 05/12/15: 64.6 10/03/14: 58   Malignant granulosa cell tumor of ovary ([place])  1994 Initial Diagnosis   1994   2002 Relapse/Recurrence   Upper abdominal recurrence, resected.     - 09/2000 Chemotherapy   6 cycles of IP cisplatin and etoposide    02/2009 Relapse/Recurrence   CT showed increased size of nodules in pelvis   2010 Surgery   Exlap with section of tumor nodules near cecum and left pelvic sidewall. Tumor: ER negative, PR positive    Treatment Plan Change   Alternated 2 week courses of Megace and Tamoxifen - ended 03/2012   03/2012 PET scan   CT - progressive disease   2013 Treatment Plan Change   Letrozole   01/2016 Imaging   MRI showed progressive disease   2017 Treatment Plan Change   Two weeks of alternating Tamoxifen 35m daily and then Megace 437mTID   08/2016 Imaging   MRI showed progressive disease with peritoneal implants near liver, in pelvis   01/2017 Treatment Plan Change   Lupron 11.25 q 3 months   11/2017 Imaging   Overall mixed response.   Mixed cystic/solid lesions in the left pelvis are mildly improved.   Cystic peritoneal disease, including the dominant lesion along the posterior right hepatic lobe, is mildly progressed.   Subcapsular lesion along the posterior  right hepatic lobe is unchanged.   03/2018 Imaging   MRI A/P: Mixed response of individual peritoneal metastases in the pelvis, as described above. Overall, there has been no significant change in bulk of disease.   Stable cystic peritoneal metastatic disease along the  capsular surfaces of the liver and spleen.   No new sites of metastatic disease identified within the abdomen or pelvis.   08/2018 Imaging   MRI A/P: Status post hysterectomy and bilateral salpingo-oophorectomy.   Mixed cystic/solid peritoneal implants in the abdomen/pelvis, as above. Dominant cystic implant along the posterior liver surface is mildly increased. Remaining lesions are overall grossly unchanged.   No new lesions are identified.   01/28/2019 Imaging   Mri A/P: 1. Relatively similar appearance of peritoneal metastasis. A posterior right hepatic capsular based lesion is similar to minimally decreased in size. Left pelvic implants are primarily similar with possible enlargement of an anterior high left pelvic cystic implant. No new disease identified. 2.  Aortic Atherosclerosis (ICD10-I70.0). 3. Hepatic steatosis. 4. Left adrenal adenoma.   06/02/2019 Imaging   MRI 1. Potential slight enlargement of dominant cystic area and solid component, associated with rind like signal variation on T2 along the inferior right hepatic margin, also potentially slightly increased. Findings may still be within the realm of measurement and technical variation. Close attention on follow-up. 2. Subtle cystic changes along the cephalad margin of the spleen are difficult to see on previous imaging, perhaps new compared with prior imaging studies. 3. Pelvic implants and left lower quadrant lesion with similar size, of the area along the left iliac vasculature may be slightly larger than on the prior study. 4. Signs of extensive retroperitoneal and pelvic lymphadenectomy. 5. Hepatic steatosis. 6. 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     06/08/2019 Cancer Staging   Staging form: [place], AJCC 7th Edition - Clinical: Stage IIIC (rT2, N1, M0) - Signed by Heath Lark, MD on 06/08/2019   06/10/2019 Imaging   1. Multiple redemonstrated partially solid  metastatic implants in the hepatorenal recess, left paracolic gutter, left pelvis, and likely the tip of the spleen as detailed above and as seen on recent prior MRI dated 06/02/2019. These findings are slightly worsened in comparison to a remote prior CT examination dated 10/04/2014.   2.  No evidence of metastatic disease in the chest.   3. Status post hysterectomy, pelvic and retroperitoneal lymph node dissection, and ventral hernia mesh repair.   4.  Hepatic steatosis.   5.  Aortic Atherosclerosis (ICD10-I70.0).   06/24/2019 - 09/02/2019 Chemotherapy   The patient had carboplatin for chemotherapy treatment.     09/02/2019 Tumor Marker   Patient's tumor was tested for the following markers: Inhibin B Results of the tumor marker test revealed 94   09/23/2019 Imaging   1. Interval progression of the soft tissue lesions in the anterior left pelvis, likely peritoneal implants, compatible with disease progression. Remaining sites of apparent disease along the liver capsule and posterior spleen are stable 2. Stable 2 cm left adrenal adenoma. 3. Hepatic steatosis. 4. Right-side predominant colonic diverticulosis without diverticulitis. 5. Aortic Atherosclerosis (ICD10-I70.0).   12/23/2019 Imaging   1. Slight interval increase in size of a mixed solid and cystic nodule in the left hemipelvis measuring 3.0 x 2.9 cm, previously 2.7 x 2.1 cm. 2. Interval decrease in size of a nodule in the left paracolic gutter measuring 1.0 x 0.8 cm, previously 2.1 x 1.6 cm. 3. Stable  peritoneal nodules in the hepatorenal recess and at the inferior tip of the spleen. 4. Unchanged nodule or lymph node overlying the left external iliac artery. 5. Enlargement of dominant left pelvic nodule is concerning for disease progression despite interval decrease in size of a nodule in the left paracolic gutter and stability of other nodules. 6. No evidence of metastatic disease in the chest. 7. Stable, benign left adrenal  adenoma. 8. Ventral hernia mesh repair with a small component of recurrent hernia inferiorly, containing a single nonobstructed loop of small bowel. 9. Hepatic steatosis. 10. Aortic Atherosclerosis (ICD10-I70.0).   12/23/2019 Tumor Marker   Patient's tumor was tested for the following markers: Inhibin B Results of the tumor marker test revealed 94.9   01/25/2020 Tumor Marker   Patient's tumor was tested for the following markers: Inhibin B Results of the tumor marker test revealed 116.7   02/20/2020 Genetic Testing   Negative genetic testing: no pathogenic variants detected in [facility] Multi-Cancer Panel.  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  The report date is February 20, 2020.   The Multi-Cancer Panel offered by [facility] includes sequencing and/or deletion duplication testing of the following 85 genes: AIP, ALK, APC, ATM, AXIN2,BAP1,  BARD1, BLM, BMPR1A, BRCA1, BRCA2, BRIP1, CASR, CDC73, CDH1, CDK4, CDKN1B, CDKN1C, CDKN2A (p14ARF), CDKN2A (p16INK4a), CEBPA, CHEK2, CTNNA1, DICER1, DIS3L2, EGFR (c.2369C>T, p.Thr790Met variant only), EPCAM (Deletion/duplication testing only), FH, FLCN, GATA2, GPC3, GREM1 (Promoter region deletion/duplication testing only), HOXB13 (c.251G>A, p.Gly84Glu), HRAS, KIT, MAX, MEN1, MET, MITF (c.952G>A, p.Glu318Lys variant only), MLH1, MSH2, MSH3, MSH6, MUTYH, NBN, NF1, NF2, NTHL1, PALB2, PDGFRA, PHOX2B, PMS2, POLD1, POLE, POT1, PRKAR1A, PTCH1, PTEN, RAD50, RAD51C, RAD51D, RB1, RECQL4, RET, RNF43, RUNX1, SDHAF2, SDHA (sequence changes only), SDHB, SDHC, SDHD, SMAD4, SMARCA4, SMARCB1, SMARCE1, STK11, SUFU, TERC, TERT, TMEM127, TP53, TSC1, TSC2, VHL, WRN and WT1.    03/02/2020 Tumor Marker   Patient's tumor was tested for the following markers: Inhibin B Results of the tumor marker test revealed 80.8   04/17/2020 Imaging   1. Overall, exam is stable. Multiple peritoneal nodules are again seen. The index nodule in the left lower quadrant is  mildly increased in size in the interval. The lesion within the anterior left pelvis has decreased in size in the interval. There has also been decrease in size of cystic lesion within the a hepatorenal recess. The remaining peritoneal lesions are unchanged. No new lesions identified. 2. Stable left adrenal nodule. 3.  Aortic Atherosclerosis (ICD10-I70.0).       REVIEW OF SYSTEMS:   Constitutional: Denies fevers, chills or abnormal weight loss Eyes: Denies blurriness of vision Ears, nose, mouth, throat, and face: Denies mucositis or sore throat Respiratory: Denies cough, dyspnea or wheezes Cardiovascular: Denies palpitation, chest discomfort or lower extremity swelling Gastrointestinal:  Denies nausea, heartburn or change in bowel habits Skin: Denies abnormal skin rashes Lymphatics: Denies new lymphadenopathy or easy bruising Neurological:Denies numbness, tingling or new weaknesses Behavioral/Psych: Mood is stable, no new changes  All other systems were reviewed with the patient and are negative.  I have reviewed the past medical history, past surgical history, social history and family history with the patient and they are unchanged from previous note.  ALLERGIES:  is allergic to codeine, oxycodone, sodium thiosalicylate, ciprofloxacin, codone [hydrocodone bitartrate], daypro [oxaprozin], levofloxacin, stadol [butorphanol tartrate], and sulfa antibiotics.  MEDICATIONS:  Current Outpatient Medications  Medication Sig Dispense Refill  . aspirin 325 MG tablet Take 325 mg by mouth daily.    . Bevacizumab (AVASTIN IV)  Inject into the vein. Every 3 weeks    . Biotin 10 MG CAPS Take by mouth.    Marland Kitchen buPROPion (WELLBUTRIN XL) 300 MG 24 hr tablet Take 1 tablet (300 mg total) by mouth daily. 30 tablet 0  . Calcium Citrate 200 MG TABS Take by mouth.    . escitalopram (LEXAPRO) 20 MG tablet Take 10 mg by mouth daily.     Marland Kitchen exemestane (AROMASIN) 25 MG tablet Take 1 tablet (25 mg total) by mouth  daily after breakfast. 90 tablet 11  . metFORMIN (GLUCOPHAGE) 1000 MG tablet 1/2 tab BID with meals 60 tablet 0  . mirabegron ER (MYRBETRIQ) 25 MG TB24 tablet Take 25 mg by mouth daily.    . Multiple Vitamin (MULTIVITAMIN) capsule Take 1 capsule by mouth daily.    Marland Kitchen omeprazole (PRILOSEC) 20 MG capsule Take 20 mg by mouth daily.    . Probiotic Product (ALIGN PO) Take 1 tablet by mouth daily.     . Vitamin D, Ergocalciferol, (DRISDOL) 1.25 MG (50000 UNIT) CAPS capsule Take 1 capsule (50,000 Units total) by mouth every 14 (fourteen) days. 4 capsule 0   No current facility-administered medications for this visit.    PHYSICAL EXAMINATION: ECOG PERFORMANCE STATUS: 1 - Symptomatic but completely ambulatory  Vitals:   07/05/20 1423  BP: (!) 122/55  Pulse: 62  Resp: 18  Temp: (!) 97.2 F (36.2 C)  SpO2: 98%   Filed Weights   07/05/20 1423  Weight: 197 lb 9.6 oz (89.6 kg)    GENERAL:alert, no distress and comfortable SKIN: skin color, texture, turgor are normal, no rashes or significant lesions EYES: normal, Conjunctiva are pink and non-injected, sclera clear OROPHARYNX:no exudate, no erythema and lips, buccal mucosa, and tongue normal  NECK: supple, thyroid normal size, non-tender, without nodularity LYMPH:  no palpable lymphadenopathy in the cervical, axillary or inguinal LUNGS: clear to auscultation and percussion with normal breathing effort HEART: regular rate & rhythm and no murmurs and no lower extremity edema ABDOMEN:abdomen soft, non-tender and normal bowel sounds.  Well-healed surgical scar Musculoskeletal:no cyanosis of digits and no clubbing  NEURO: alert & oriented x 3 with fluent speech, no focal motor/sensory deficits  LABORATORY DATA:  I have reviewed the data as listed    Component Value Date/Time   NA 139 07/05/2020 1412   NA 139 11/30/2019 0913   NA 141 09/24/2013 1131   K 4.2 07/05/2020 1412   K 4.5 09/24/2013 1131   CL 103 07/05/2020 1412   CO2 27  07/05/2020 1412   CO2 24 09/24/2013 1131   GLUCOSE 101 (H) 07/05/2020 1412   GLUCOSE 90 09/24/2013 1131   BUN 25 (H) 07/05/2020 1412   BUN 32 (H) 11/30/2019 0913   BUN 21.2 09/24/2013 1131   CREATININE 1.17 (H) 07/05/2020 1412   CREATININE 1.18 (H) 12/21/2019 1259   CREATININE 1.1 09/24/2013 1131   CALCIUM 9.4 07/05/2020 1412   CALCIUM 9.5 09/24/2013 1131   PROT 7.2 07/05/2020 1412   PROT 7.0 11/30/2019 0913   PROT 6.8 09/24/2013 1131   ALBUMIN 3.9 07/05/2020 1412   ALBUMIN 4.3 11/30/2019 0913   ALBUMIN 3.9 09/24/2013 1131   AST 17 07/05/2020 1412   AST 18 12/21/2019 1259   AST 23 09/24/2013 1131   ALT 18 07/05/2020 1412   ALT 18 12/21/2019 1259   ALT 27 09/24/2013 1131   ALKPHOS 60 07/05/2020 1412   ALKPHOS 81 09/24/2013 1131   BILITOT 0.3 07/05/2020 1412   BILITOT 0.3  12/21/2019 1259   BILITOT 0.53 09/24/2013 1131   GFRNONAA 52 (L) 07/05/2020 1412   GFRNONAA 49 (L) 12/21/2019 1259   GFRAA 55 (L) 01/24/2020 1530   GFRAA 57 (L) 12/21/2019 1259    No results found for: SPEP, UPEP  Lab Results  Component Value Date   WBC 5.3 07/05/2020   NEUTROABS 2.6 07/05/2020   HGB 14.2 07/05/2020   HCT 41.3 07/05/2020   MCV 92.2 07/05/2020   PLT 273 07/05/2020      Chemistry      Component Value Date/Time   NA 139 07/05/2020 1412   NA 139 11/30/2019 0913   NA 141 09/24/2013 1131   K 4.2 07/05/2020 1412   K 4.5 09/24/2013 1131   CL 103 07/05/2020 1412   CO2 27 07/05/2020 1412   CO2 24 09/24/2013 1131   BUN 25 (H) 07/05/2020 1412   BUN 32 (H) 11/30/2019 0913   BUN 21.2 09/24/2013 1131   CREATININE 1.17 (H) 07/05/2020 1412   CREATININE 1.18 (H) 12/21/2019 1259   CREATININE 1.1 09/24/2013 1131      Component Value Date/Time   CALCIUM 9.4 07/05/2020 1412   CALCIUM 9.5 09/24/2013 1131   ALKPHOS 60 07/05/2020 1412   ALKPHOS 81 09/24/2013 1131   AST 17 07/05/2020 1412   AST 18 12/21/2019 1259   AST 23 09/24/2013 1131   ALT 18 07/05/2020 1412   ALT 18 12/21/2019 1259    ALT 27 09/24/2013 1131   BILITOT 0.3 07/05/2020 1412   BILITOT 0.3 12/21/2019 1259   BILITOT 0.53 09/24/2013 1131

## 2020-07-06 NOTE — Assessment & Plan Note (Signed)
She has diffuse musculoskeletal pain that could certainly be exacerbated by antiestrogen therapy Recommend she continues high-dose vitamin D and graduated exercise as tolerated

## 2020-07-06 NOTE — Assessment & Plan Note (Signed)
She has delayed treatment due to many issues I recommend minimum 3 doses starting this week before repeat imaging study I have reminded her that she needs to get her appointment scheduled She will continue close monitoring of her blood pressure at home and call me if her blood pressure is uncontrolled

## 2020-07-10 ENCOUNTER — Other Ambulatory Visit (HOSPITAL_BASED_OUTPATIENT_CLINIC_OR_DEPARTMENT_OTHER): Payer: Self-pay

## 2020-07-11 ENCOUNTER — Ambulatory Visit (HOSPITAL_COMMUNITY)
Admission: RE | Admit: 2020-07-11 | Discharge: 2020-07-11 | Disposition: A | Discharge status: Home / Self Care | Payer: 59 | Source: Ambulatory Visit | Attending: Internal Medicine | Admitting: Internal Medicine

## 2020-07-11 ENCOUNTER — Other Ambulatory Visit: Payer: Self-pay

## 2020-07-11 DIAGNOSIS — M79642 Pain in left hand: Secondary | ICD-10-CM | POA: Insufficient documentation

## 2020-07-11 DIAGNOSIS — M19032 Primary osteoarthritis, left wrist: Secondary | ICD-10-CM | POA: Diagnosis not present

## 2020-07-13 ENCOUNTER — Ambulatory Visit (INDEPENDENT_AMBULATORY_CARE_PROVIDER_SITE_OTHER): Payer: 59 | Admitting: Family Medicine

## 2020-07-13 ENCOUNTER — Other Ambulatory Visit: Payer: Self-pay

## 2020-07-13 ENCOUNTER — Encounter (INDEPENDENT_AMBULATORY_CARE_PROVIDER_SITE_OTHER): Payer: Self-pay | Admitting: Family Medicine

## 2020-07-13 ENCOUNTER — Other Ambulatory Visit (INDEPENDENT_AMBULATORY_CARE_PROVIDER_SITE_OTHER): Payer: Self-pay | Admitting: Family Medicine

## 2020-07-13 VITALS — BP 114/73 | HR 63 | Temp 97.7°F | Ht 66.0 in | Wt 191.0 lb

## 2020-07-13 DIAGNOSIS — E669 Obesity, unspecified: Secondary | ICD-10-CM | POA: Diagnosis not present

## 2020-07-13 DIAGNOSIS — E559 Vitamin D deficiency, unspecified: Secondary | ICD-10-CM | POA: Diagnosis not present

## 2020-07-13 DIAGNOSIS — F3289 Other specified depressive episodes: Secondary | ICD-10-CM | POA: Diagnosis not present

## 2020-07-13 DIAGNOSIS — Z9189 Other specified personal risk factors, not elsewhere classified: Secondary | ICD-10-CM

## 2020-07-13 DIAGNOSIS — Z6833 Body mass index (BMI) 33.0-33.9, adult: Secondary | ICD-10-CM

## 2020-07-13 MED ORDER — BUPROPION HCL ER (SR) 200 MG PO TB12: 200.0000 mg | ORAL_TABLET | Freq: Two times a day (BID) | ORAL | 0 refills | Status: DC

## 2020-07-13 MED FILL — BUPROPION HCL SR 200 MG TAB: 200 | 30 days supply | Qty: 60 | Fill #0

## 2020-07-17 NOTE — Progress Notes (Signed)
Chief Complaint:   OBESITY [name] is here to discuss her progress with her obesity treatment plan along with follow-up of her obesity related diagnoses. [name] is on the Category 3 Plan and states she is following her eating plan approximately 60% of the time. [name] states she is swimming and walking 30 minutes 2 times per week.  Today's visit was #: 70 Starting weight: 209 lbs Starting date: 04/29/2018 Today's weight: 191 lbs Today's date: 07/13/2020 Total lbs lost to date: 18 lbs Total lbs lost since last in-office visit: 0  Interim History: Aizlyn has been working and doing some walking and started swimming with goal to swim 2 times a week. Her daughter brought candy ([employer] and chewy candies) and didn't take it with her when she left.  Subjective:   1. Other depression, with emotional eating [name] denies suicidal or homicidal ideations. She is on Wellbutrin 300 mg daily. She is interested in maybe BID dosing. Her BP is well controlled today.  2. Vitamin D deficiency [name] denies nausea, vomiting, and muscle weakness but notes fatigue. Pt is not on prescription Vit D currently. Her last Vit D level was from August 2021 at 59.0.  3. At risk for side effect of medication [name] is at risk for side effect of medication due to increasing dose of Wellbutrin.  Assessment/Plan:   1. Other depression, with emotional eating Behavior modification techniques were discussed today to help [name] deal with her emotional/non-hunger eating behaviors.  Orders and follow up as documented in patient record. Change Wellbutrin to SR 200 mg, as per below.  - buPROPion (WELLBUTRIN SR) 200 MG 12 hr tablet; Take 1 tablet (200 mg total) by mouth 2 (two) times daily.  Dispense: 60 tablet; Refill: 0  2. Vitamin D deficiency Low Vitamin D level contributes to fatigue and are associated with obesity, breast, and colon cancer. She agrees to follow-up for routine testing of Vitamin D, at least 2-3 times per  year to avoid over-replacement. Check labs at next appointment.  3. At risk for side effect of medication [name] was given approximately 15 minutes of drug side effect counseling today.  We discussed side effect possibility and risk versus benefits. [name] agreed to the medication and will contact this office if these side effects are intolerable.  Repetitive spaced learning was employed today to elicit superior memory formation and behavioral change.  4. BMI 30 currently [name] is currently in the action stage of change. As such, her goal is to continue with weight loss efforts. She has agreed to the Category 3 Plan.   Exercise goals: As is  Behavioral modification strategies: increasing lean protein intake, meal planning and cooking strategies, keeping healthy foods in the home and planning for success.  [name] has agreed to follow-up with our clinic in 3-4 weeks, fasting. She was informed of the importance of frequent follow-up visits to maximize her success with intensive lifestyle modifications for her multiple health conditions.   Objective:   Blood pressure 114/73, pulse 63, temperature 97.7 F (36.5 C), temperature source Oral, height 5\' 6"  (1.676 m), weight 191 lb (86.6 kg), SpO2 97 %. Body mass index is 30.83 kg/m.  General: Cooperative, alert, well developed, in no acute distress. HEENT: Conjunctivae and lids unremarkable. Cardiovascular: Regular rhythm.  Lungs: Normal work of breathing. Neurologic: No focal deficits.   Lab Results  Component Value Date   CREATININE 1.17 (H) 07/05/2020   BUN 25 (H) 07/05/2020   NA 139 07/05/2020   K  4.2 07/05/2020   CL 103 07/05/2020   CO2 27 07/05/2020   Lab Results  Component Value Date   ALT 18 07/05/2020   AST 17 07/05/2020   ALKPHOS 60 07/05/2020   BILITOT 0.3 07/05/2020   Lab Results  Component Value Date   HGBA1C 5.2 11/30/2019   HGBA1C 5.3 05/31/2019   HGBA1C 5.2 11/19/2018   HGBA1C 5.5 04/29/2018   Lab Results   Component Value Date   INSULIN 23.8 11/30/2019   INSULIN 16.5 05/31/2019   INSULIN 17.6 11/19/2018   INSULIN 25.8 (H) 04/29/2018   Lab Results  Component Value Date   TSH 0.707 11/30/2019   Lab Results  Component Value Date   CHOL 224 (H) 11/30/2019   HDL 52 11/30/2019   LDLCALC 136 (H) 11/30/2019   TRIG 201 (H) 11/30/2019   Lab Results  Component Value Date   WBC 5.3 07/05/2020   HGB 14.2 07/05/2020   HCT 41.3 07/05/2020   MCV 92.2 07/05/2020   PLT 273 07/05/2020    Attestation Statements:   Reviewed by clinician on day of visit: allergies, medications, problem list, medical history, surgical history, family history, social history, and previous encounter notes.  Coral Ceo, am acting as transcriptionist for Coralie Common, MD.   I have reviewed the above documentation for accuracy and completeness, and I agree with the above. - Jinny Blossom, MD

## 2020-07-18 DIAGNOSIS — M1812 Unilateral primary osteoarthritis of first carpometacarpal joint, left hand: Secondary | ICD-10-CM | POA: Diagnosis not present

## 2020-07-18 DIAGNOSIS — M189 Osteoarthritis of first carpometacarpal joint, unspecified: Secondary | ICD-10-CM | POA: Diagnosis not present

## 2020-07-27 ENCOUNTER — Inpatient Hospital Stay: Payer: 59 | Attending: Hematology | Admitting: Hematology and Oncology

## 2020-07-27 ENCOUNTER — Other Ambulatory Visit: Payer: Self-pay | Admitting: Hematology and Oncology

## 2020-07-27 ENCOUNTER — Other Ambulatory Visit: Payer: Self-pay

## 2020-07-27 ENCOUNTER — Encounter: Payer: Self-pay | Admitting: Hematology and Oncology

## 2020-07-27 ENCOUNTER — Inpatient Hospital Stay: Payer: 59

## 2020-07-27 DIAGNOSIS — Z5112 Encounter for antineoplastic immunotherapy: Secondary | ICD-10-CM | POA: Diagnosis not present

## 2020-07-27 DIAGNOSIS — E559 Vitamin D deficiency, unspecified: Secondary | ICD-10-CM

## 2020-07-27 DIAGNOSIS — C569 Malignant neoplasm of unspecified ovary: Secondary | ICD-10-CM | POA: Insufficient documentation

## 2020-07-27 DIAGNOSIS — N183 Chronic kidney disease, stage 3 unspecified: Secondary | ICD-10-CM | POA: Diagnosis not present

## 2020-07-27 DIAGNOSIS — Z7189 Other specified counseling: Secondary | ICD-10-CM

## 2020-07-27 LAB — COMPREHENSIVE METABOLIC PANEL
ALT: 18 U/L (ref 0–44)
AST: 20 U/L (ref 15–41)
Albumin: 4.2 g/dL (ref 3.5–5.0)
Alkaline Phosphatase: 61 U/L (ref 38–126)
Anion gap: 12 (ref 5–15)
BUN: 35 mg/dL — ABNORMAL HIGH (ref 8–23)
CO2: 23 mmol/L (ref 22–32)
Calcium: 9.6 mg/dL (ref 8.9–10.3)
Chloride: 100 mmol/L (ref 98–111)
Creatinine, Ser: 1.27 mg/dL — ABNORMAL HIGH (ref 0.44–1.00)
GFR, Estimated: 48 mL/min — ABNORMAL LOW (ref >60)
Glucose, Bld: 85 mg/dL (ref 70–99)
Potassium: 4.8 mmol/L (ref 3.5–5.1)
Sodium: 135 mmol/L (ref 135–145)
Total Bilirubin: 0.6 mg/dL (ref 0.3–1.2)
Total Protein: 7.5 g/dL (ref 6.5–8.1)

## 2020-07-27 LAB — CBC WITH DIFFERENTIAL (CANCER CENTER ONLY)
Abs Immature Granulocytes: 0 10*3/uL (ref 0.00–0.07)
Basophils Absolute: 0 10*3/uL (ref 0.0–0.1)
Basophils Relative: 1 %
Eosinophils Absolute: 0.2 10*3/uL (ref 0.0–0.5)
Eosinophils Relative: 3 %
HCT: 42.9 % (ref 36.0–46.0)
Hemoglobin: 14.9 g/dL (ref 12.0–15.0)
Immature Granulocytes: 0 %
Lymphocytes Relative: 29 %
Lymphs Abs: 1.7 10*3/uL (ref 0.7–4.0)
MCH: 31.7 pg (ref 26.0–34.0)
MCHC: 34.7 g/dL (ref 30.0–36.0)
MCV: 91.3 fL (ref 80.0–100.0)
Monocytes Absolute: 0.5 10*3/uL (ref 0.1–1.0)
Monocytes Relative: 9 %
Neutro Abs: 3.3 10*3/uL (ref 1.7–7.7)
Neutrophils Relative %: 58 %
Platelet Count: 265 10*3/uL (ref 150–400)
RBC: 4.7 MIL/uL (ref 3.87–5.11)
RDW: 11.9 % (ref 11.5–15.5)
WBC Count: 5.7 10*3/uL (ref 4.0–10.5)
nRBC: 0 % (ref 0.0–0.2)

## 2020-07-27 LAB — TOTAL PROTEIN, URINE DIPSTICK: Protein, ur: NEGATIVE mg/dL

## 2020-07-27 MED ORDER — VITAMIN D (ERGOCALCIFEROL) 1.25 MG (50000 UNIT) PO CAPS: ORAL_CAPSULE | ORAL | 0 refills | Status: DC

## 2020-07-27 NOTE — Assessment & Plan Note (Signed)
Multifactorial, exacerbated by poor oral fluid intake I encouraged her to increase oral fluids as tolerated

## 2020-07-27 NOTE — Progress Notes (Signed)
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   All questions were answered. The patient knows to call the clinic with any problems, questions or concerns. The total time spent in the appointment was 20 minutes encounter with patients including review of chart and various tests results, discussions about plan of care and coordination of care plan   Heath Lark, MD 07/27/2020 12:14 PM  INTERVAL HISTORY: Please see below for problem oriented charting. She returns for further follow-up She is doing well Denies abdominal pain or changes in bowel habits No recent nausea Her documented blood pressure at home is satisfactory  SUMMARY OF ONCOLOGIC HISTORY: Oncology History Overview Note  2018 - Core biopsy for ER/PR and Foundation One testing performed. Unfortunately, no sufficient tissue for Foundation One testing. ER was 50% and PR 90%. 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     - 09/2000 Chemotherapy   6 cycles of IP cisplatin and etoposide    02/2009 Relapse/Recurrence   CT showed increased size of nodules in pelvis   2010 Surgery   Exlap with section of tumor nodules near cecum and left pelvic sidewall. Tumor: ER negative, PR positive    Treatment Plan Change   Alternated 2 week courses of Megace and Tamoxifen - ended 03/2012   03/2012 PET scan   CT - progressive disease   2013 Treatment Plan Change   Letrozole   01/2016 Imaging   MRI showed progressive disease   2017 Treatment Plan Change   Two weeks of alternating Tamoxifen 20mg  daily and then Megace 40mg  TID   08/2016 Imaging   MRI showed progressive disease with peritoneal implants near liver, in pelvis   01/2017 Treatment Plan Change   Lupron 11.25 q 3 months   11/2017 Imaging   Overall mixed response.   Mixed cystic/solid lesions in the left pelvis are mildly improved.   Cystic peritoneal disease, including the dominant lesion along the posterior right hepatic lobe, is mildly progressed.   Subcapsular lesion along the posterior right hepatic lobe is unchanged.   03/2018 Imaging   MRI A/P: Mixed response of individual peritoneal metastases in the pelvis, as described above. Overall, there has been no significant change in bulk of disease.   Stable cystic peritoneal metastatic disease along the capsular surfaces of the liver and spleen.   No new sites of metastatic disease identified within the abdomen or pelvis.   08/2018 Imaging   MRI A/P: Status post hysterectomy and bilateral  salpingo-oophorectomy.   Mixed cystic/solid peritoneal implants in the abdomen/pelvis, as above. Dominant cystic implant along the posterior liver surface is mildly increased. Remaining lesions are overall grossly unchanged.   No new lesions are identified.   01/28/2019 Imaging   Mri A/P: 1.  Relatively similar appearance of peritoneal metastasis. A posterior right hepatic capsular based lesion is similar to minimally decreased in size. Left pelvic implants are primarily similar with possible enlargement of an anterior high left pelvic cystic implant. No new disease identified. 2.  Aortic Atherosclerosis (ICD10-I70.0). 3. Hepatic steatosis. 4. Left adrenal adenoma.   06/02/2019 Imaging   MRI 1. Potential slight enlargement of dominant cystic area and solid component, associated with rind like signal variation on T2 along the inferior right hepatic margin, also potentially slightly increased. Findings may still be within the realm of measurement and technical variation. Close attention on follow-up. 2. Subtle cystic changes along the cephalad margin of the spleen are difficult to see on previous imaging, perhaps new compared with prior imaging studies. 3. Pelvic implants and left lower quadrant lesion with similar size, of the area along the left iliac vasculature may be slightly larger than on the prior study. 4. Signs of extensive retroperitoneal and pelvic lymphadenectomy. 5. Hepatic steatosis. 6. 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     06/08/2019 Cancer Staging   Staging form: [place], AJCC 7th Edition - Clinical: Stage IIIC (rT2, N1, M0) - Signed by Artis Delay, MD on 06/08/2019   06/10/2019 Imaging   1. 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 These findings are slightly worsened in comparison to a remote prior CT examination dated 10/04/2014.   2.  No evidence of metastatic disease in the chest.   3. Status post hysterectomy, pelvic and retroperitoneal lymph node dissection, and ventral hernia mesh repair.   4.  Hepatic steatosis.   5.  Aortic  Atherosclerosis (ICD10-I70.0).   06/24/2019 - 09/02/2019 Chemotherapy   The patient had carboplatin for chemotherapy treatment.     09/02/2019 Tumor Marker   Patient's tumor was tested for the following markers: Inhibin B Results of the tumor marker test revealed 94   09/23/2019 Imaging   1. Interval progression of the soft tissue lesions in the anterior left pelvis, likely peritoneal implants, compatible with disease progression. Remaining sites of apparent disease along the liver capsule and posterior spleen are stable 2. Stable 2 cm left adrenal adenoma. 3. Hepatic steatosis. 4. Right-side predominant colonic diverticulosis without diverticulitis. 5. Aortic Atherosclerosis (ICD10-I70.0).   12/23/2019 Imaging   1. Slight interval increase in size of a mixed solid and cystic nodule in the left hemipelvis measuring 3.0 x 2.9 cm, previously 2.7 x 2.1 cm. 2. Interval decrease in size of a nodule in the left paracolic gutter measuring 1.0 x 0.8 cm, previously 2.1 x 1.6 cm. 3. Stable peritoneal nodules in the hepatorenal recess and at the inferior tip of the spleen. 4. Unchanged nodule or lymph node overlying the left external iliac artery. 5. Enlargement of dominant left pelvic nodule is concerning for disease progression despite interval decrease in size of a nodule in the left paracolic gutter and stability of other nodules. 6. No evidence of metastatic disease in the chest. 7. Stable, benign left adrenal adenoma. 8. Ventral hernia mesh repair with a small  component of recurrent hernia inferiorly, containing a single nonobstructed loop of small bowel. 9. Hepatic steatosis. 10. Aortic Atherosclerosis (ICD10-I70.0).   12/23/2019 Tumor Marker   Patient's tumor was tested for the following markers: Inhibin B Results of the tumor marker test revealed 94.9   01/25/2020 Tumor Marker   Patient's tumor was tested for the following markers: Inhibin B Results of the tumor marker test revealed 116.7    02/20/2020 Genetic Testing   Negative genetic testing: no pathogenic variants detected in [facility] Multi-Cancer Panel.  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  The report date is February 20, 2020.   The Multi-Cancer Panel offered by [facility] includes sequencing and/or deletion duplication testing of the following 85 genes: AIP, ALK, APC, ATM, AXIN2,BAP1,  BARD1, BLM, BMPR1A, BRCA1, BRCA2, BRIP1, CASR, CDC73, CDH1, CDK4, CDKN1B, CDKN1C, CDKN2A (p14ARF), CDKN2A (p16INK4a), CEBPA, CHEK2, CTNNA1, DICER1, DIS3L2, EGFR (c.2369C>T, p.Thr790Met variant only), EPCAM (Deletion/duplication testing only), FH, FLCN, GATA2, GPC3, GREM1 (Promoter region deletion/duplication testing only), HOXB13 (c.251G>A, p.Gly84Glu), HRAS, KIT, MAX, MEN1, MET, MITF (c.952G>A, p.Glu318Lys variant only), MLH1, MSH2, MSH3, MSH6, MUTYH, NBN, NF1, NF2, NTHL1, PALB2, PDGFRA, PHOX2B, PMS2, POLD1, POLE, POT1, PRKAR1A, PTCH1, PTEN, RAD50, RAD51C, RAD51D, RB1, RECQL4, RET, RNF43, RUNX1, SDHAF2, SDHA (sequence changes only), SDHB, SDHC, SDHD, SMAD4, SMARCA4, SMARCB1, SMARCE1, STK11, SUFU, TERC, TERT, TMEM127, TP53, TSC1, TSC2, VHL, WRN and WT1.    03/02/2020 Tumor Marker   Patient's tumor was tested for the following markers: Inhibin B Results of the tumor marker test revealed 80.8   04/17/2020 Imaging   1. Overall, exam is stable. Multiple peritoneal nodules are again seen. The index nodule in the left lower quadrant is mildly increased in size in the interval. The lesion within the anterior left pelvis has decreased in size in the interval. There has also been decrease in size of cystic lesion within the a hepatorenal recess. The remaining peritoneal lesions are unchanged. No new lesions identified. 2. Stable left adrenal nodule. 3.  Aortic Atherosclerosis (ICD10-I70.0).       REVIEW OF SYSTEMS:   Constitutional: Denies fevers, chills or abnormal weight loss Eyes: Denies blurriness of vision Ears, nose,  mouth, throat, and face: Denies mucositis or sore throat Respiratory: Denies cough, dyspnea or wheezes Cardiovascular: Denies palpitation, chest discomfort or lower extremity swelling Gastrointestinal:  Denies nausea, heartburn or change in bowel habits Skin: Denies abnormal skin rashes Lymphatics: Denies new lymphadenopathy or easy bruising Neurological:Denies numbness, tingling or new weaknesses Behavioral/Psych: Mood is stable, no new changes  All other systems were reviewed with the patient and are negative.  I have reviewed the past medical history, past surgical history, social history and family history with the patient and they are unchanged from previous note.  ALLERGIES:  is allergic to codeine, oxycodone, sodium thiosalicylate, ciprofloxacin, codone [hydrocodone bitartrate], daypro [oxaprozin], levofloxacin, stadol [butorphanol tartrate], and sulfa antibiotics.  MEDICATIONS:  Current Outpatient Medications  Medication Sig Dispense Refill  . Vitamin D, Ergocalciferol, (DRISDOL) 1.25 MG (50000 UNIT) CAPS capsule Take 50,000 Units by mouth every 7 (seven) days.    Marland Kitchen aspirin 325 MG tablet Take 325 mg by mouth daily.    . Bevacizumab (AVASTIN IV) Inject into the vein. Every 3 weeks    . Biotin 10 MG CAPS Take by mouth.    Marland Kitchen buPROPion (WELLBUTRIN SR) 200 MG 12 hr tablet TAKE 1 TABLET BY MOUTH 2 TIMES DAILY 60 tablet 0  . Calcium Citrate 200 MG TABS Take by mouth.    . escitalopram (  LEXAPRO) 20 MG tablet TAKE 1 TABLET BY MOUTH ONCE A DAY 90 tablet 3  . exemestane (AROMASIN) 25 MG tablet TAKE 1 TABLET (25 MG TOTAL) BY MOUTH DAILY AFTER BREAKFAST. 90 tablet 11  . metFORMIN (GLUCOPHAGE) 1000 MG tablet TAKE 1/2 TABLET BY MOUTH 2 TIMES DAILY WITH MEALS 60 tablet 0  . mirabegron ER (MYRBETRIQ) 25 MG TB24 tablet Take 25 mg by mouth daily.    . mirabegron ER (MYRBETRIQ) 50 MG TB24 tablet TAKE 1 TABLET BY MOUTH DAILY 90 tablet 3  . Multiple Vitamin (MULTIVITAMIN) capsule Take 1 capsule by  mouth daily.    Marland Kitchen omeprazole (PRILOSEC) 20 MG capsule Take 20 mg by mouth daily.    . Probiotic Product (ALIGN PO) Take 1 tablet by mouth daily.      No current facility-administered medications for this visit.    PHYSICAL EXAMINATION: ECOG PERFORMANCE STATUS: 0 - Asymptomatic  Vitals:   07/27/20 1138  BP: (!) 108/58  Pulse: (!) 58  Resp: 18  Temp: (!) 97.4 F (36.3 C)  SpO2: 96%   Filed Weights   07/27/20 1138  Weight: 198 lb 3.2 oz (89.9 kg)    GENERAL:alert, no distress and comfortable SKIN: skin color, texture, turgor are normal, no rashes or significant lesions EYES: normal, Conjunctiva are pink and non-injected, sclera clear OROPHARYNX:no exudate, no erythema and lips, buccal mucosa, and tongue normal  NECK: supple, thyroid normal size, non-tender, without nodularity LYMPH:  no palpable lymphadenopathy in the cervical, axillary or inguinal LUNGS: clear to auscultation and percussion with normal breathing effort HEART: regular rate & rhythm and no murmurs and no lower extremity edema ABDOMEN:abdomen soft, non-tender and normal bowel sounds Musculoskeletal:no cyanosis of digits and no clubbing  NEURO: alert & oriented x 3 with fluent speech, no focal motor/sensory deficits  LABORATORY DATA:  I have reviewed the data as listed    Component Value Date/Time   NA 135 07/27/2020 1043   NA 139 11/30/2019 0913   NA 141 09/24/2013 1131   K 4.8 07/27/2020 1043   K 4.5 09/24/2013 1131   CL 100 07/27/2020 1043   CO2 23 07/27/2020 1043   CO2 24 09/24/2013 1131   GLUCOSE 85 07/27/2020 1043   GLUCOSE 90 09/24/2013 1131   BUN 35 (H) 07/27/2020 1043   BUN 32 (H) 11/30/2019 0913   BUN 21.2 09/24/2013 1131   CREATININE 1.27 (H) 07/27/2020 1043   CREATININE 1.18 (H) 12/21/2019 1259   CREATININE 1.1 09/24/2013 1131   CALCIUM 9.6 07/27/2020 1043   CALCIUM 9.5 09/24/2013 1131   PROT 7.5 07/27/2020 1043   PROT 7.0 11/30/2019 0913   PROT 6.8 09/24/2013 1131   ALBUMIN 4.2  07/27/2020 1043   ALBUMIN 4.3 11/30/2019 0913   ALBUMIN 3.9 09/24/2013 1131   AST 20 07/27/2020 1043   AST 18 12/21/2019 1259   AST 23 09/24/2013 1131   ALT 18 07/27/2020 1043   ALT 18 12/21/2019 1259   ALT 27 09/24/2013 1131   ALKPHOS 61 07/27/2020 1043   ALKPHOS 81 09/24/2013 1131   BILITOT 0.6 07/27/2020 1043   BILITOT 0.3 12/21/2019 1259   BILITOT 0.53 09/24/2013 1131   GFRNONAA 48 (L) 07/27/2020 1043   GFRNONAA 49 (L) 12/21/2019 1259   GFRAA 55 (L) 01/24/2020 1530   GFRAA 57 (L) 12/21/2019 1259    No results found for: SPEP, UPEP  Lab Results  Component Value Date   WBC 5.7 07/27/2020   NEUTROABS 3.3 07/27/2020   HGB 14.9  07/27/2020   HCT 42.9 07/27/2020   MCV 91.3 07/27/2020   PLT 265 07/27/2020      Chemistry      Component Value Date/Time   NA 135 07/27/2020 1043   NA 139 11/30/2019 0913   NA 141 09/24/2013 1131   K 4.8 07/27/2020 1043   K 4.5 09/24/2013 1131   CL 100 07/27/2020 1043   CO2 23 07/27/2020 1043   CO2 24 09/24/2013 1131   BUN 35 (H) 07/27/2020 1043   BUN 32 (H) 11/30/2019 0913   BUN 21.2 09/24/2013 1131   CREATININE 1.27 (H) 07/27/2020 1043   CREATININE 1.18 (H) 12/21/2019 1259   CREATININE 1.1 09/24/2013 1131      Component Value Date/Time   CALCIUM 9.6 07/27/2020 1043   CALCIUM 9.5 09/24/2013 1131   ALKPHOS 61 07/27/2020 1043   ALKPHOS 81 09/24/2013 1131   AST 20 07/27/2020 1043   AST 18 12/21/2019 1259   AST 23 09/24/2013 1131   ALT 18 07/27/2020 1043   ALT 18 12/21/2019 1259   ALT 27 09/24/2013 1131   BILITOT 0.6 07/27/2020 1043   BILITOT 0.3 12/21/2019 1259   BILITOT 0.53 09/24/2013 1131       RADIOGRAPHIC STUDIES: I have personally reviewed the radiological images as listed and agreed with the findings in the report. DG Wrist 2 Views Left  Result Date: 07/13/2020 CLINICAL DATA:  Fall several months ago with persistent wrist pain, initial encounter EXAM: LEFT WRIST - 2 VIEW COMPARISON:  None. FINDINGS: Well corticated  bony density is noted adjacent to the distal ulna consistent with prior ulnar styloid fracture and nonunion. This is of uncertain chronicity. Degenerative changes of the first [facility] joint are seen. No acute fracture or dislocation is noted. No soft tissue changes are seen. IMPRESSION: Chronic degenerative and posttraumatic changes as described. No acute abnormality noted. Electronically Signed   By: Inez Catalina M.D.   On: 07/13/2020 08:40

## 2020-07-27 NOTE — Assessment & Plan Note (Signed)
So far, she tolerated treatment well except for occasional hot flashes She will proceed with treatment as scheduled Plan to repeat imaging study next month

## 2020-07-28 ENCOUNTER — Inpatient Hospital Stay: Payer: 59

## 2020-07-28 VITALS — BP 121/53 | HR 54 | Temp 97.8°F | Resp 16

## 2020-07-28 DIAGNOSIS — N183 Chronic kidney disease, stage 3 unspecified: Secondary | ICD-10-CM | POA: Diagnosis not present

## 2020-07-28 DIAGNOSIS — Z7189 Other specified counseling: Secondary | ICD-10-CM

## 2020-07-28 DIAGNOSIS — Z5112 Encounter for antineoplastic immunotherapy: Secondary | ICD-10-CM | POA: Diagnosis not present

## 2020-07-28 DIAGNOSIS — C569 Malignant neoplasm of unspecified ovary: Secondary | ICD-10-CM

## 2020-07-28 MED ORDER — SODIUM CHLORIDE 0.9 % IV SOLN
Freq: Once | INTRAVENOUS | Status: AC
  Filled 2020-07-28: qty 250

## 2020-07-28 MED ORDER — SODIUM CHLORIDE 0.9 % IV SOLN
15.0000 mg/kg | INTRAVENOUS | Status: DC
  Administered 2020-07-28: 1300 mg via INTRAVENOUS
  Filled 2020-07-28: qty 48

## 2020-07-28 NOTE — Patient Instructions (Signed)
[facility] Discharge Instructions for Patients Receiving Chemotherapy  Today you received the following chemotherapy agents: BEVACIZUMAB.  To help prevent nausea and vomiting after your treatment, we encourage you to take your nausea medication as directed.   If you develop nausea and vomiting that is not controlled by your nausea medication, call the clinic.   BELOW ARE SYMPTOMS THAT SHOULD BE REPORTED IMMEDIATELY:  *FEVER GREATER THAN 100.5 F  *CHILLS WITH OR WITHOUT FEVER  NAUSEA AND VOMITING THAT IS NOT CONTROLLED WITH YOUR NAUSEA MEDICATION  *UNUSUAL SHORTNESS OF BREATH  *UNUSUAL BRUISING OR BLEEDING  TENDERNESS IN MOUTH AND THROAT WITH OR WITHOUT PRESENCE OF ULCERS  *URINARY PROBLEMS  *BOWEL PROBLEMS  UNUSUAL RASH Items with * indicate a potential emergency and should be followed up as soon as possible.  Feel free to call the clinic should you have any questions or concerns. The clinic phone number is [phone].  Please show the [facility] at check-in to the Emergency Department and triage nurse.

## 2020-08-04 ENCOUNTER — Other Ambulatory Visit (HOSPITAL_COMMUNITY): Payer: Self-pay

## 2020-08-04 MED FILL — Exemestane Tab 25 MG: ORAL | 90 days supply | Qty: 90 | Fill #0 | Status: AC

## 2020-08-09 DIAGNOSIS — M79642 Pain in left hand: Secondary | ICD-10-CM | POA: Diagnosis not present

## 2020-08-09 DIAGNOSIS — M13842 Other specified arthritis, left hand: Secondary | ICD-10-CM | POA: Diagnosis not present

## 2020-08-14 ENCOUNTER — Encounter: Payer: Self-pay | Admitting: Hematology and Oncology

## 2020-08-16 ENCOUNTER — Inpatient Hospital Stay: Payer: 59

## 2020-08-16 ENCOUNTER — Inpatient Hospital Stay: Payer: 59 | Admitting: Hematology and Oncology

## 2020-08-17 ENCOUNTER — Inpatient Hospital Stay: Payer: 59

## 2020-08-21 DIAGNOSIS — Z6831 Body mass index (BMI) 31.0-31.9, adult: Secondary | ICD-10-CM | POA: Diagnosis not present

## 2020-08-21 DIAGNOSIS — R8279 Other abnormal findings on microbiological examination of urine: Secondary | ICD-10-CM | POA: Diagnosis not present

## 2020-08-21 DIAGNOSIS — Z1231 Encounter for screening mammogram for malignant neoplasm of breast: Secondary | ICD-10-CM | POA: Diagnosis not present

## 2020-08-21 DIAGNOSIS — Z01419 Encounter for gynecological examination (general) (routine) without abnormal findings: Secondary | ICD-10-CM | POA: Diagnosis not present

## 2020-08-21 DIAGNOSIS — Z13820 Encounter for screening for osteoporosis: Secondary | ICD-10-CM | POA: Diagnosis not present

## 2020-08-22 ENCOUNTER — Encounter: Payer: Self-pay | Admitting: Hematology and Oncology

## 2020-08-28 ENCOUNTER — Encounter: Payer: Self-pay | Admitting: Hematology and Oncology

## 2020-08-28 ENCOUNTER — Other Ambulatory Visit (HOSPITAL_COMMUNITY): Payer: Self-pay

## 2020-08-28 ENCOUNTER — Inpatient Hospital Stay: Payer: 59

## 2020-08-28 ENCOUNTER — Other Ambulatory Visit: Payer: Self-pay

## 2020-08-28 ENCOUNTER — Encounter (INDEPENDENT_AMBULATORY_CARE_PROVIDER_SITE_OTHER): Payer: Self-pay | Admitting: Family Medicine

## 2020-08-28 ENCOUNTER — Inpatient Hospital Stay: Payer: 59 | Attending: Hematology | Admitting: Hematology and Oncology

## 2020-08-28 ENCOUNTER — Ambulatory Visit (INDEPENDENT_AMBULATORY_CARE_PROVIDER_SITE_OTHER): Payer: 59 | Admitting: Family Medicine

## 2020-08-28 VITALS — BP 123/78 | HR 59 | Temp 97.6°F | Ht 66.0 in | Wt 187.0 lb

## 2020-08-28 DIAGNOSIS — R109 Unspecified abdominal pain: Secondary | ICD-10-CM | POA: Insufficient documentation

## 2020-08-28 DIAGNOSIS — K0889 Other specified disorders of teeth and supporting structures: Secondary | ICD-10-CM | POA: Diagnosis not present

## 2020-08-28 DIAGNOSIS — Z7189 Other specified counseling: Secondary | ICD-10-CM

## 2020-08-28 DIAGNOSIS — Z9189 Other specified personal risk factors, not elsewhere classified: Secondary | ICD-10-CM | POA: Diagnosis not present

## 2020-08-28 DIAGNOSIS — E8881 Metabolic syndrome: Secondary | ICD-10-CM

## 2020-08-28 DIAGNOSIS — F3289 Other specified depressive episodes: Secondary | ICD-10-CM | POA: Diagnosis not present

## 2020-08-28 DIAGNOSIS — R1010 Upper abdominal pain, unspecified: Secondary | ICD-10-CM | POA: Diagnosis not present

## 2020-08-28 DIAGNOSIS — C569 Malignant neoplasm of unspecified ovary: Secondary | ICD-10-CM

## 2020-08-28 DIAGNOSIS — F32A Depression, unspecified: Secondary | ICD-10-CM | POA: Insufficient documentation

## 2020-08-28 DIAGNOSIS — Z8639 Personal history of other endocrine, nutritional and metabolic disease: Secondary | ICD-10-CM

## 2020-08-28 DIAGNOSIS — Z5112 Encounter for antineoplastic immunotherapy: Secondary | ICD-10-CM | POA: Insufficient documentation

## 2020-08-28 DIAGNOSIS — Z6833 Body mass index (BMI) 33.0-33.9, adult: Secondary | ICD-10-CM

## 2020-08-28 DIAGNOSIS — E669 Obesity, unspecified: Secondary | ICD-10-CM | POA: Diagnosis not present

## 2020-08-28 DIAGNOSIS — E88819 Insulin resistance, unspecified: Secondary | ICD-10-CM

## 2020-08-28 LAB — CBC WITH DIFFERENTIAL (CANCER CENTER ONLY)
Abs Immature Granulocytes: 0.01 10*3/uL (ref 0.00–0.07)
Basophils Absolute: 0 10*3/uL (ref 0.0–0.1)
Basophils Relative: 1 %
Eosinophils Absolute: 0.4 10*3/uL (ref 0.0–0.5)
Eosinophils Relative: 7 %
HCT: 41.4 % (ref 36.0–46.0)
Hemoglobin: 14.3 g/dL (ref 12.0–15.0)
Immature Granulocytes: 0 %
Lymphocytes Relative: 36 %
Lymphs Abs: 2 10*3/uL (ref 0.7–4.0)
MCH: 31.8 pg (ref 26.0–34.0)
MCHC: 34.5 g/dL (ref 30.0–36.0)
MCV: 92 fL (ref 80.0–100.0)
Monocytes Absolute: 0.5 10*3/uL (ref 0.1–1.0)
Monocytes Relative: 9 %
Neutro Abs: 2.6 10*3/uL (ref 1.7–7.7)
Neutrophils Relative %: 47 %
Platelet Count: 251 10*3/uL (ref 150–400)
RBC: 4.5 MIL/uL (ref 3.87–5.11)
RDW: 12 % (ref 11.5–15.5)
WBC Count: 5.5 10*3/uL (ref 4.0–10.5)
nRBC: 0 % (ref 0.0–0.2)

## 2020-08-28 LAB — COMPREHENSIVE METABOLIC PANEL
ALT: 15 U/L (ref 0–44)
AST: 21 U/L (ref 15–41)
Albumin: 4 g/dL (ref 3.5–5.0)
Alkaline Phosphatase: 55 U/L (ref 38–126)
Anion gap: 12 (ref 5–15)
BUN: 36 mg/dL — ABNORMAL HIGH (ref 8–23)
CO2: 23 mmol/L (ref 22–32)
Calcium: 9.5 mg/dL (ref 8.9–10.3)
Chloride: 104 mmol/L (ref 98–111)
Creatinine, Ser: 1.44 mg/dL — ABNORMAL HIGH (ref 0.44–1.00)
GFR, Estimated: 41 mL/min — ABNORMAL LOW (ref >60)
Glucose, Bld: 97 mg/dL (ref 70–99)
Potassium: 4.6 mmol/L (ref 3.5–5.1)
Sodium: 139 mmol/L (ref 135–145)
Total Bilirubin: 0.4 mg/dL (ref 0.3–1.2)
Total Protein: 7.1 g/dL (ref 6.5–8.1)

## 2020-08-28 LAB — TOTAL PROTEIN, URINE DIPSTICK: Protein, ur: NEGATIVE mg/dL

## 2020-08-28 MED ORDER — METFORMIN HCL 500 MG PO TABS
500.0000 mg | ORAL_TABLET | Freq: Two times a day (BID) | ORAL | 0 refills | Status: DC
  Filled 2020-08-28: qty 60, 30d supply, fill #0

## 2020-08-28 MED ORDER — BUPROPION HCL ER (SR) 200 MG PO TB12
ORAL_TABLET | Freq: Two times a day (BID) | ORAL | 0 refills | Status: DC
  Filled 2020-08-28: qty 60, 30d supply, fill #0

## 2020-08-28 NOTE — Assessment & Plan Note (Signed)
She had nonspecific abdominal pain Her examination is benign She is reassured We will resume treatment without delay

## 2020-08-28 NOTE — Progress Notes (Signed)
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[place]) Her treatment has been interrupted many times due to surgical issues Over the past 8 months, she has only received 7 doses of bevacizumab Her dental work is completed and she has no healing problems I recommend resumption of bevacizumab this week If she can resume treatment in time, I plan to repeat imaging study next month for further follow-up She is in agreement  Pain, dental Her dental issue has resolved We will resume bevacizumab without delay  Abdominal pain She had nonspecific abdominal pain Her examination is benign She is reassured We will resume treatment without delay   No orders of the defined types were placed in this encounter.   All questions were answered. The patient knows to call the clinic with any problems, questions or concerns. The total time spent in the appointment was 20 minutes encounter with patients including review of chart and various tests results, discussions about plan of care and coordination of care plan   Heath Lark, MD 08/28/2020 3:28 PM  INTERVAL HISTORY: Please see below for problem oriented charting. She returns for further follow-up She has completed recent dental extraction Her jaw is healing well and she no longer has pain However, she started to notice some vague intermittent abdominal discomfort located in the upper quadrant of her abdomen that comes and goes They are not related with food She has no nausea or changes in bowel habits No bloating  SUMMARY OF ONCOLOGIC HISTORY: Oncology History Overview Note  2018 - Core biopsy for ER/PR and Foundation One testing performed. Unfortunately, no sufficient tissue for Foundation One testing. ER was 50% and PR 90%. 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     - 09/2000 Chemotherapy   6 cycles of IP cisplatin and etoposide    02/2009 Relapse/Recurrence   CT showed increased size of nodules in pelvis   2010 Surgery   Exlap with section of tumor nodules near cecum and left pelvic sidewall. Tumor: ER negative, PR positive    Treatment Plan Change   Alternated 2 week courses of Megace and Tamoxifen - ended 03/2012   03/2012 PET scan   CT - progressive disease   2013 Treatment Plan Change   Letrozole   01/2016 Imaging   MRI showed progressive disease   2017 Treatment Plan Change   Two weeks of alternating Tamoxifen 71m daily and then Megace 463mTID   08/2016 Imaging   MRI showed progressive disease with peritoneal implants near liver, in pelvis   01/2017 Treatment Plan Change   Lupron 11.25 q 3 months   11/2017 Imaging   Overall mixed response.   Mixed cystic/solid lesions in the left pelvis are mildly improved.   Cystic peritoneal disease, including the dominant lesion along the posterior right hepatic lobe, is mildly progressed.  Subcapsular lesion along the posterior right hepatic lobe is unchanged.   03/2018 Imaging   MRI A/P: Mixed response of individual peritoneal metastases in the pelvis, as described above. Overall, there has been no significant change in bulk of disease.   Stable cystic peritoneal metastatic disease along the capsular surfaces  of the liver and spleen.   No new sites of metastatic disease identified within the abdomen or pelvis.   08/2018 Imaging   MRI A/P: Status post hysterectomy and bilateral salpingo-oophorectomy.   Mixed cystic/solid peritoneal implants in the abdomen/pelvis, as above. Dominant cystic implant along the posterior liver surface is mildly increased. Remaining lesions are overall grossly unchanged.   No new lesions are identified.   01/28/2019 Imaging   Mri A/P: 1. Relatively similar appearance of peritoneal metastasis. A posterior right hepatic capsular based lesion is similar to minimally decreased in size. Left pelvic implants are primarily similar with possible enlargement of an anterior high left pelvic cystic implant. No new disease identified. 2.  Aortic Atherosclerosis (ICD10-I70.0). 3. Hepatic steatosis. 4. Left adrenal adenoma.   06/02/2019 Imaging   MRI 1. Potential slight enlargement of dominant cystic area and solid component, associated with rind like signal variation on T2 along the inferior right hepatic margin, also potentially slightly increased. Findings may still be within the realm of measurement and technical variation. Close attention on follow-up. 2. Subtle cystic changes along the cephalad margin of the spleen are difficult to see on previous imaging, perhaps new compared with prior imaging studies. 3. Pelvic implants and left lower quadrant lesion with similar size, of the area along the left iliac vasculature may be slightly larger than on the prior study. 4. Signs of extensive retroperitoneal and pelvic lymphadenectomy. 5. Hepatic steatosis. 6. 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     06/08/2019 Cancer Staging   Staging form: [facility], AJCC 7th Edition - Clinical: Stage IIIC (rT2, N1, M0) - Signed by Heath Lark, MD on 06/08/2019   06/10/2019 Imaging   1. Multiple redemonstrated partially solid metastatic implants in  the hepatorenal recess, left paracolic gutter, left pelvis, and likely the tip of the spleen as detailed above and as seen on recent prior MRI dated 06/02/2019. These findings are slightly worsened in comparison to a remote prior CT examination dated 10/04/2014.   2.  No evidence of metastatic disease in the chest.   3. Status post hysterectomy, pelvic and retroperitoneal lymph node dissection, and ventral hernia mesh repair.   4.  Hepatic steatosis.   5.  Aortic Atherosclerosis (ICD10-I70.0).   06/24/2019 - 09/02/2019 Chemotherapy   The patient had carboplatin for chemotherapy treatment.     09/02/2019 Tumor Marker   Patient's tumor was tested for the following markers: Inhibin B Results of the tumor marker test revealed 94   09/23/2019 Imaging   1. Interval progression of the soft tissue lesions in the anterior left pelvis, likely peritoneal implants, compatible with disease progression. Remaining sites of apparent disease along the liver capsule and posterior spleen are stable 2. Stable 2 cm left adrenal adenoma. 3. Hepatic steatosis. 4. Right-side predominant colonic diverticulosis without diverticulitis. 5. Aortic Atherosclerosis (ICD10-I70.0).   12/23/2019 Imaging   1. Slight interval increase in size of a mixed solid and cystic nodule in the left hemipelvis measuring 3.0 x 2.9 cm, previously 2.7 x 2.1 cm. 2. Interval decrease in size of a nodule in the left paracolic gutter measuring 1.0 x 0.8 cm, previously 2.1  x 1.6 cm. 3. Stable peritoneal nodules in the hepatorenal recess and at the inferior tip of the spleen. 4. Unchanged nodule or lymph node overlying the left external iliac artery. 5. Enlargement of dominant left pelvic nodule is concerning for disease progression despite interval decrease in size of a nodule in the left paracolic gutter and stability of other nodules. 6. No evidence of metastatic disease in the chest. 7. Stable, benign left adrenal adenoma. 8. Ventral hernia  mesh repair with a small component of recurrent hernia inferiorly, containing a single nonobstructed loop of small bowel. 9. Hepatic steatosis. 10. Aortic Atherosclerosis (ICD10-I70.0).   12/23/2019 Tumor Marker   Patient's tumor was tested for the following markers: Inhibin B Results of the tumor marker test revealed 94.9   01/25/2020 Tumor Marker   Patient's tumor was tested for the following markers: Inhibin B Results of the tumor marker test revealed 116.7   02/20/2020 Genetic Testing   Negative genetic testing: no pathogenic variants detected in [facility] Multi-Cancer Panel.  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  The report date is February 20, 2020.   The Multi-Cancer Panel offered by [facility] includes sequencing and/or deletion duplication testing of the following 85 genes: AIP, ALK, APC, ATM, AXIN2,BAP1,  BARD1, BLM, BMPR1A, BRCA1, BRCA2, BRIP1, CASR, CDC73, CDH1, CDK4, CDKN1B, CDKN1C, CDKN2A (p14ARF), CDKN2A (p16INK4a), CEBPA, CHEK2, CTNNA1, DICER1, DIS3L2, EGFR (c.2369C>T, p.Thr790Met variant only), EPCAM (Deletion/duplication testing only), FH, FLCN, GATA2, GPC3, GREM1 (Promoter region deletion/duplication testing only), HOXB13 (c.251G>A, p.Gly84Glu), HRAS, KIT, MAX, MEN1, MET, MITF (c.952G>A, p.Glu318Lys variant only), MLH1, MSH2, MSH3, MSH6, MUTYH, NBN, NF1, NF2, NTHL1, PALB2, PDGFRA, PHOX2B, PMS2, POLD1, POLE, POT1, PRKAR1A, PTCH1, PTEN, RAD50, RAD51C, RAD51D, RB1, RECQL4, RET, RNF43, RUNX1, SDHAF2, SDHA (sequence changes only), SDHB, SDHC, SDHD, SMAD4, SMARCA4, SMARCB1, SMARCE1, STK11, SUFU, TERC, TERT, TMEM127, TP53, TSC1, TSC2, VHL, WRN and WT1.    03/02/2020 Tumor Marker   Patient's tumor was tested for the following markers: Inhibin B Results of the tumor marker test revealed 80.8   04/17/2020 Imaging   1. Overall, exam is stable. Multiple peritoneal nodules are again seen. The index nodule in the left lower quadrant is mildly increased in size in  the interval. The lesion within the anterior left pelvis has decreased in size in the interval. There has also been decrease in size of cystic lesion within the a hepatorenal recess. The remaining peritoneal lesions are unchanged. No new lesions identified. 2. Stable left adrenal nodule. 3.  Aortic Atherosclerosis (ICD10-I70.0).       REVIEW OF SYSTEMS:   Constitutional: Denies fevers, chills or abnormal weight loss Eyes: Denies blurriness of vision Ears, nose, mouth, throat, and face: Denies mucositis or sore throat Respiratory: Denies cough, dyspnea or wheezes Cardiovascular: Denies palpitation, chest discomfort or lower extremity swelling Gastrointestinal:  Denies nausea, heartburn or change in bowel habits Skin: Denies abnormal skin rashes Lymphatics: Denies new lymphadenopathy or easy bruising Neurological:Denies numbness, tingling or new weaknesses Behavioral/Psych: Mood is stable, no new changes  All other systems were reviewed with the patient and are negative.  I have reviewed the past medical history, past surgical history, social history and family history with the patient and they are unchanged from previous note.  ALLERGIES:  is allergic to codeine, hydrocodone-acetaminophen, other, oxycodone, sodium thiosalicylate, thimerosal, tramadol hcl, ciprofloxacin, codone [hydrocodone bitartrate], daypro [oxaprozin], levofloxacin, stadol [butorphanol tartrate], and sulfa antibiotics.  MEDICATIONS:  Current Outpatient Medications  Medication Sig Dispense Refill  . aspirin 325 MG tablet Take 325 mg  by mouth daily.    . Bevacizumab (AVASTIN IV) Inject into the vein. Every 3 weeks    . Biotin 10 MG CAPS Take by mouth.    Marland Kitchen buPROPion (WELLBUTRIN SR) 200 MG 12 hr tablet TAKE 1 TABLET BY MOUTH 2 TIMES DAILY 60 tablet 0  . Calcium Citrate 200 MG TABS Take by mouth.    . escitalopram (LEXAPRO) 20 MG tablet TAKE 1 TABLET BY MOUTH ONCE A DAY 90 tablet 3  . exemestane (AROMASIN) 25 MG tablet  TAKE 1 TABLET (25 MG TOTAL) BY MOUTH DAILY AFTER BREAKFAST. 90 tablet 11  . metFORMIN (GLUCOPHAGE) 500 MG tablet Take 1 tablet (500 mg total) by mouth 2 (two) times daily with a meal. 60 tablet 0  . mirabegron ER (MYRBETRIQ) 25 MG TB24 tablet Take 25 mg by mouth daily.    . mirabegron ER (MYRBETRIQ) 50 MG TB24 tablet TAKE 1 TABLET BY MOUTH DAILY 90 tablet 3  . Multiple Vitamin (MULTIVITAMIN) capsule Take 1 capsule by mouth daily.    Marland Kitchen omeprazole (PRILOSEC) 20 MG capsule Take 20 mg by mouth daily.    . Probiotic Product (ALIGN PO) Take 1 tablet by mouth daily.     . Vitamin D, Ergocalciferol, (DRISDOL) 1.25 MG (50000 UNIT) CAPS capsule Take 50,000 Units by mouth every 7 (seven) days.     No current facility-administered medications for this visit.    PHYSICAL EXAMINATION: ECOG PERFORMANCE STATUS: 1 - Symptomatic but completely ambulatory  Vitals:   08/28/20 1517  BP: (!) 116/51  Pulse: 67  Resp: 18  Temp: (!) 97.4 F (36.3 C)  SpO2: 100%   Filed Weights   08/28/20 1517  Weight: 189 lb 4.8 oz (85.9 kg)    GENERAL:alert, no distress and comfortable SKIN: skin color, texture, turgor are normal, no rashes or significant lesions EYES: normal, Conjunctiva are pink and non-injected, sclera clear OROPHARYNX:no exudate, no erythema and lips, buccal mucosa, and tongue normal  NECK: supple, thyroid normal size, non-tender, without nodularity LYMPH:  no palpable lymphadenopathy in the cervical, axillary or inguinal LUNGS: clear to auscultation and percussion with normal breathing effort HEART: regular rate & rhythm and no murmurs and no lower extremity edema ABDOMEN:abdomen soft, well-healed surgical scars.  No palpable subcutaneous nodules or carcinomatosis on exam Musculoskeletal:no cyanosis of digits and no clubbing  NEURO: alert & oriented x 3 with fluent speech, no focal motor/sensory deficits  LABORATORY DATA:  I have reviewed the data as listed    Component Value Date/Time   NA  135 07/27/2020 1043   NA 139 11/30/2019 0913   NA 141 09/24/2013 1131   K 4.8 07/27/2020 1043   K 4.5 09/24/2013 1131   CL 100 07/27/2020 1043   CO2 23 07/27/2020 1043   CO2 24 09/24/2013 1131   GLUCOSE 85 07/27/2020 1043   GLUCOSE 90 09/24/2013 1131   BUN 35 (H) 07/27/2020 1043   BUN 32 (H) 11/30/2019 0913   BUN 21.2 09/24/2013 1131   CREATININE 1.27 (H) 07/27/2020 1043   CREATININE 1.18 (H) 12/21/2019 1259   CREATININE 1.1 09/24/2013 1131   CALCIUM 9.6 07/27/2020 1043   CALCIUM 9.5 09/24/2013 1131   PROT 7.5 07/27/2020 1043   PROT 7.0 11/30/2019 0913   PROT 6.8 09/24/2013 1131   ALBUMIN 4.2 07/27/2020 1043   ALBUMIN 4.3 11/30/2019 0913   ALBUMIN 3.9 09/24/2013 1131   AST 20 07/27/2020 1043   AST 18 12/21/2019 1259   AST 23 09/24/2013 1131   ALT  18 07/27/2020 1043   ALT 18 12/21/2019 1259   ALT 27 09/24/2013 1131   ALKPHOS 61 07/27/2020 1043   ALKPHOS 81 09/24/2013 1131   BILITOT 0.6 07/27/2020 1043   BILITOT 0.3 12/21/2019 1259   BILITOT 0.53 09/24/2013 1131   GFRNONAA 48 (L) 07/27/2020 1043   GFRNONAA 49 (L) 12/21/2019 1259   GFRAA 55 (L) 01/24/2020 1530   GFRAA 57 (L) 12/21/2019 1259    No results found for: SPEP, UPEP  Lab Results  Component Value Date   WBC 5.7 07/27/2020   NEUTROABS 3.3 07/27/2020   HGB 14.9 07/27/2020   HCT 42.9 07/27/2020   MCV 91.3 07/27/2020   PLT 265 07/27/2020      Chemistry      Component Value Date/Time   NA 135 07/27/2020 1043   NA 139 11/30/2019 0913   NA 141 09/24/2013 1131   K 4.8 07/27/2020 1043   K 4.5 09/24/2013 1131   CL 100 07/27/2020 1043   CO2 23 07/27/2020 1043   CO2 24 09/24/2013 1131   BUN 35 (H) 07/27/2020 1043   BUN 32 (H) 11/30/2019 0913   BUN 21.2 09/24/2013 1131   CREATININE 1.27 (H) 07/27/2020 1043   CREATININE 1.18 (H) 12/21/2019 1259   CREATININE 1.1 09/24/2013 1131      Component Value Date/Time   CALCIUM 9.6 07/27/2020 1043   CALCIUM 9.5 09/24/2013 1131   ALKPHOS 61 07/27/2020 1043    ALKPHOS 81 09/24/2013 1131   AST 20 07/27/2020 1043   AST 18 12/21/2019 1259   AST 23 09/24/2013 1131   ALT 18 07/27/2020 1043   ALT 18 12/21/2019 1259   ALT 27 09/24/2013 1131   BILITOT 0.6 07/27/2020 1043   BILITOT 0.3 12/21/2019 1259   BILITOT 0.53 09/24/2013 1131

## 2020-08-28 NOTE — Assessment & Plan Note (Signed)
Her dental issue has resolved We will resume bevacizumab without delay

## 2020-08-28 NOTE — Assessment & Plan Note (Signed)
Her treatment has been interrupted many times due to surgical issues Over the past 8 months, she has only received 7 doses of bevacizumab Her dental work is completed and she has no healing problems I recommend resumption of bevacizumab this week If she can resume treatment in time, I plan to repeat imaging study next month for further follow-up She is in agreement

## 2020-08-29 ENCOUNTER — Inpatient Hospital Stay: Payer: 59

## 2020-08-29 ENCOUNTER — Telehealth: Payer: Self-pay | Admitting: Hematology and Oncology

## 2020-08-29 VITALS — BP 140/67 | HR 51 | Temp 97.9°F | Resp 18

## 2020-08-29 DIAGNOSIS — C569 Malignant neoplasm of unspecified ovary: Secondary | ICD-10-CM

## 2020-08-29 DIAGNOSIS — Z5112 Encounter for antineoplastic immunotherapy: Secondary | ICD-10-CM | POA: Diagnosis not present

## 2020-08-29 DIAGNOSIS — R109 Unspecified abdominal pain: Secondary | ICD-10-CM | POA: Diagnosis not present

## 2020-08-29 DIAGNOSIS — Z7189 Other specified counseling: Secondary | ICD-10-CM

## 2020-08-29 MED ORDER — SODIUM CHLORIDE 0.9 % IV SOLN
Freq: Once | INTRAVENOUS | Status: AC
  Filled 2020-08-29: qty 250

## 2020-08-29 MED ORDER — SODIUM CHLORIDE 0.9 % IV SOLN
15.0000 mg/kg | INTRAVENOUS | Status: DC
  Administered 2020-08-29: 1300 mg via INTRAVENOUS
  Filled 2020-08-29: qty 48

## 2020-08-29 NOTE — Telephone Encounter (Signed)
Scheduled appointment  per 05/09 schedule message. Patient is aware. 

## 2020-08-29 NOTE — Patient Instructions (Signed)
[facility]COL[facility]  Discharge Instructions: °Thank you for choosing [facility] to provide your oncology and hematology care.  ° °If you have a lab appointment with the Cancer Center, please go directly to the Cancer Center and check in at the registration area. °  °Wear comfortable clothing and clothing appropriate for easy access to any Portacath or PICC line.  ° °We strive to give you quality time with your provider. You may need to reschedule your appointment if you arrive late (15 or more minutes).  Arriving late affects you and other patients whose appointments are after yours.  Also, if you miss three or more appointments without notifying the office, you may be dismissed from the clinic at the provider’s discretion.    °  °For prescription refill requests, have your pharmacy contact our office and allow 72 hours for refills to be completed.   ° °Today you received the following chemotherapy and/or immunotherapy agents: bevacizumab    °  °To help prevent nausea and vomiting after your treatment, we encourage you to take your nausea medication as directed. ° °BELOW ARE SYMPTOMS THAT SHOULD BE REPORTED IMMEDIATELY: °*FEVER GREATER THAN 100.4 F (38 °C) OR HIGHER °*CHILLS OR SWEATING °*NAUSEA AND VOMITING THAT IS NOT CONTROLLED WITH YOUR NAUSEA MEDICATION °*UNUSUAL SHORTNESS OF BREATH °*UNUSUAL BRUISING OR BLEEDING °*URINARY PROBLEMS (pain or burning when urinating, or frequent urination) °*BOWEL PROBLEMS (unusual diarrhea, constipation, pain near the anus) °TENDERNESS IN MOUTH AND THROAT WITH OR WITHOUT PRESENCE OF ULCERS (sore throat, sores in mouth, or a toothache) °UNUSUAL RASH, SWELLING OR PAIN  °UNUSUAL VAGINAL DISCHARGE OR ITCHING  ° °Items with * indicate a potential emergency and should be followed up as soon as possible or go to the Emergency Department if any problems should occur. ° °Please show the CHEMOTHERAPY ALERT CARD or IMMUNOTHERAPY ALERT CARD at check-in  to the Emergency Department and triage nurse. ° °Should you have questions after your visit or need to cancel or reschedule your appointment, please contact [facility]  Dept: [phone]  and follow the prompts.  Office hours are 8:00 a.m. to 4:30 p.m. Monday - Friday. Please note that voicemails left after 4:00 p.m. may not be returned until the following business day.  We are closed weekends and major holidays. You have access to a nurse at all times for urgent questions. Please call the main number to the clinic Dept: [phone] and follow the prompts. ° ° °For any non-urgent questions, you may also contact your provider using MyChart. We now offer e-Visits for anyone 18 and older to request care online for non-urgent symptoms. For details visit [email or web address]. °  °Also download the MyChart app! Go to the app store, search "MyChart", open the app, select [facility], and log in with your MyChart username and password. ° °Due to Covid, a mask is required upon entering the hospital/clinic. If you do not have a mask, one will be given to you upon arrival. For doctor visits, patients may have 1 support person aged 18 or older with them. For treatment visits, patients cannot have anyone with them due to current Covid guidelines and our immunocompromised population.  ° °

## 2020-08-31 ENCOUNTER — Other Ambulatory Visit: Payer: Self-pay | Admitting: Internal Medicine

## 2020-08-31 ENCOUNTER — Other Ambulatory Visit: Payer: Self-pay

## 2020-08-31 ENCOUNTER — Ambulatory Visit
Admission: RE | Admit: 2020-08-31 | Discharge: 2020-08-31 | Disposition: A | Discharge status: Home / Self Care | Payer: 59 | Source: Ambulatory Visit | Attending: Internal Medicine | Admitting: Internal Medicine

## 2020-08-31 DIAGNOSIS — M542 Cervicalgia: Secondary | ICD-10-CM

## 2020-08-31 DIAGNOSIS — M47812 Spondylosis without myelopathy or radiculopathy, cervical region: Secondary | ICD-10-CM | POA: Diagnosis not present

## 2020-08-31 DIAGNOSIS — M4802 Spinal stenosis, cervical region: Secondary | ICD-10-CM | POA: Diagnosis not present

## 2020-09-04 ENCOUNTER — Other Ambulatory Visit (HOSPITAL_COMMUNITY): Payer: Self-pay

## 2020-09-04 MED FILL — Mirabegron Tab ER 24 HR 50 MG: ORAL | 90 days supply | Qty: 90 | Fill #0 | Status: AC

## 2020-09-05 ENCOUNTER — Ambulatory Visit: Payer: 59

## 2020-09-05 NOTE — Progress Notes (Signed)
Chief Complaint:   OBESITY [name] is here to discuss her progress with her obesity treatment plan along with follow-up of her obesity related diagnoses.   Today's visit was #: 62 Starting weight: 209 lbs Starting date: 04/29/2018 Today's weight: 187 lbs Today's date: 08/28/2020 Weight change since last visit: 4 lbs Total lbs lost to date: 22 lbs Body mass index is 30.18 kg/m.  Total weight loss percentage to date: -10.53%  Interim History:  [name] last office visit was with Dr. Jearld Shines on 07/13/2020; this is her first office visit with me.  She has lost 4 pounds since her last visit.    She says her appetite has decreased.  She had dental work and has been unable to eat real food.  She has been drinking a lot of protein shakes, eating high protein oatmeal, drinking [employer] milk, eating yogurt, etc.  She is not sure of her calorie or protein intake in a day.    Plan:  -Advised patient it would be best for her to journal as we would be able to track her calories and proteins per day.  However, [name] would like to stick with Category 3 as her dental issues are almost resolved and she declines journaling.  Current Meal Plan: the Category 3 Plan for 70% of the time.   Current Exercise Plan: Swimming for 60 minutes 3 times per week.  Assessment/Plan:    Medications Discontinued During This Encounter  Medication Reason  . metFORMIN (GLUCOPHAGE) 1000 MG tablet Discontinued by provider  . buPROPion (WELLBUTRIN SR) 200 MG 12 hr tablet Reorder     Meds ordered this encounter  Medications  . buPROPion (WELLBUTRIN SR) 200 MG 12 hr tablet    Sig: TAKE 1 TABLET BY MOUTH 2 TIMES DAILY    Dispense:  60 tablet    Refill:  0  . metFORMIN (GLUCOPHAGE) 500 MG tablet    Sig: Take 1 tablet (500 mg total) by mouth 2 (two) times daily with a meal.    Dispense:  60 tablet    Refill:  0    1. Insulin resistance Not at goal. Goal is HgbA1c < 5.7, fasting insulin closer to 5.  Medication:  metformin 500 mg twice daily.    Plan:  Refill metformin today at same dose.  She prefers not to cut the pill in half.  She will continue to focus on protein-rich, low simple carbohydrate foods. We reviewed the importance of hydration, regular exercise for stress reduction, and restorative sleep.   Lab Results  Component Value Date   HGBA1C 5.2 11/30/2019   Lab Results  Component Value Date   INSULIN 23.8 11/30/2019   INSULIN 16.5 05/31/2019   INSULIN 17.6 11/19/2018   INSULIN 25.8 (H) 04/29/2018   - Refill metFORMIN (GLUCOPHAGE) 500 MG tablet; Take 1 tablet (500 mg total) by mouth 2 (two) times daily with a meal.  Dispense: 60 tablet; Refill: 0  2. History of vitamin D deficiency Per patient, she was told she did not need a vitamin D supplement anymore in the past by Dr. Jearld Shines.  It was last check around 9 months ago and was 59.0.  She was on medications at that time.  Plan:  Consider rechecking vitamin D and other labs at next office visit.  3. Other depression, with emotional eating Controlled. Medication: Wellbutrin 200 mg twice daily.  Changed to SR formula at last office visit.  Taking twice daily.  She says it helps more with emotional  eating and cravings.  Tolerating well, without side effects.  Plan:  Will refill Wellbutrin today at same dose. Behavior modification techniques were discussed today to help deal with emotional/non-hunger eating behaviors.  - Refill buPROPion (WELLBUTRIN SR) 200 MG 12 hr tablet; TAKE 1 TABLET BY MOUTH 2 TIMES DAILY  Dispense: 60 tablet; Refill: 0  4. At risk for dehydration [name] is at higher than average risk of dehydration.  [name] was given more than 10 minutes of proper hydration counseling today.  We discussed the signs and symptoms of dehydration, some of which may include muscle cramping, constipation or even orthostatic symptoms.  Counseling on the prevention of dehydration was also provided today.  [name] is at risk for dehydration due to  weight loss, lifestyle and behavorial habits and possibly due to taking certain medication(s).  She was encouraged to adequately hydrate and monitor fluid status to avoid dehydration as well as weight loss plateaus.  Unless pre-existing renal or cardiopulmonary conditions exist, in which patient was told to limit their fluid intake, I recommended roughly one half of their weight in pounds to be the approximate ounces of non-caloric, non-caffeinated beverages they should drink per day; including more if they are engaging in exercise.  5. Obesity, current BMI 30.2  Course: [name] is currently in the action stage of change. As such, her goal is to continue with weight loss efforts.   Nutrition goals: She has agreed to the Category 3 Plan.   Exercise goals: As is.  Behavioral modification strategies: increasing lean protein intake, decreasing simple carbohydrates, meal planning and cooking strategies and planning for success.  [name] has agreed to follow-up with our clinic in 3-4 weeks with Dr. Jearld Shines and will likely will need blood work at that time. She was informed of the importance of frequent follow-up visits to maximize her success with intensive lifestyle modifications for her multiple health conditions.   Objective:   Blood pressure 123/78, pulse (!) 59, temperature 97.6 F (36.4 C), height 5\' 6"  (1.676 m), weight 187 lb (84.8 kg), SpO2 100 %. Body mass index is 30.18 kg/m.  General: Cooperative, alert, well developed, in no acute distress. HEENT: Conjunctivae and lids unremarkable. Cardiovascular: Regular rhythm.  Lungs: Normal work of breathing. Neurologic: No focal deficits.   Lab Results  Component Value Date   CREATININE 1.44 (H) 08/28/2020   BUN 36 (H) 08/28/2020   NA 139 08/28/2020   K 4.6 08/28/2020   CL 104 08/28/2020   CO2 23 08/28/2020   Lab Results  Component Value Date   ALT 15 08/28/2020   AST 21 08/28/2020   ALKPHOS 55 08/28/2020   BILITOT 0.4 08/28/2020    Lab Results  Component Value Date   HGBA1C 5.2 11/30/2019   HGBA1C 5.3 05/31/2019   HGBA1C 5.2 11/19/2018   HGBA1C 5.5 04/29/2018   Lab Results  Component Value Date   INSULIN 23.8 11/30/2019   INSULIN 16.5 05/31/2019   INSULIN 17.6 11/19/2018   INSULIN 25.8 (H) 04/29/2018   Lab Results  Component Value Date   TSH 0.707 11/30/2019   Lab Results  Component Value Date   CHOL 224 (H) 11/30/2019   HDL 52 11/30/2019   LDLCALC 136 (H) 11/30/2019   TRIG 201 (H) 11/30/2019   Lab Results  Component Value Date   WBC 5.5 08/28/2020   HGB 14.3 08/28/2020   HCT 41.4 08/28/2020   MCV 92.0 08/28/2020   PLT 251 08/28/2020   Attestation Statements:   Reviewed by clinician on  day of visit: allergies, medications, problem list, medical history, surgical history, family history, social history, and previous encounter notes.  I, Water quality scientist, CMA, am acting as Location manager for [employer], DO.  I have reviewed the above documentation for accuracy and completeness, and I agree with the above. Marjory Sneddon, D.O.  The [place] was signed into law in 2016 which includes the topic of electronic health records.  This provides immediate access to information in MyChart.  This includes consultation notes, operative notes, office notes, lab results and pathology reports.  If you have any questions about what you read please let us know at your next visit so we can discuss your concerns and take corrective action if need be.  We are right here with you.

## 2020-09-06 ENCOUNTER — Inpatient Hospital Stay (HOSPITAL_BASED_OUTPATIENT_CLINIC_OR_DEPARTMENT_OTHER): Payer: 59

## 2020-09-06 DIAGNOSIS — Z23 Encounter for immunization: Secondary | ICD-10-CM | POA: Diagnosis not present

## 2020-09-07 DIAGNOSIS — Z760 Encounter for issue of repeat prescription: Secondary | ICD-10-CM | POA: Diagnosis not present

## 2020-09-08 DIAGNOSIS — M542 Cervicalgia: Secondary | ICD-10-CM | POA: Diagnosis not present

## 2020-09-14 ENCOUNTER — Other Ambulatory Visit: Payer: Self-pay | Admitting: Hematology and Oncology

## 2020-09-20 ENCOUNTER — Telehealth: Payer: Self-pay

## 2020-09-20 ENCOUNTER — Other Ambulatory Visit: Payer: Self-pay

## 2020-09-20 ENCOUNTER — Inpatient Hospital Stay: Payer: 59 | Attending: Hematology

## 2020-09-20 DIAGNOSIS — M542 Cervicalgia: Secondary | ICD-10-CM | POA: Diagnosis not present

## 2020-09-20 DIAGNOSIS — C569 Malignant neoplasm of unspecified ovary: Secondary | ICD-10-CM | POA: Diagnosis not present

## 2020-09-20 DIAGNOSIS — N951 Menopausal and female climacteric states: Secondary | ICD-10-CM | POA: Diagnosis not present

## 2020-09-20 DIAGNOSIS — R110 Nausea: Secondary | ICD-10-CM | POA: Insufficient documentation

## 2020-09-20 DIAGNOSIS — Z5112 Encounter for antineoplastic immunotherapy: Secondary | ICD-10-CM | POA: Insufficient documentation

## 2020-09-20 DIAGNOSIS — Z7189 Other specified counseling: Secondary | ICD-10-CM

## 2020-09-20 DIAGNOSIS — N183 Chronic kidney disease, stage 3 unspecified: Secondary | ICD-10-CM | POA: Insufficient documentation

## 2020-09-20 LAB — COMPREHENSIVE METABOLIC PANEL
ALT: 17 U/L (ref 0–44)
AST: 20 U/L (ref 15–41)
Albumin: 3.8 g/dL (ref 3.5–5.0)
Alkaline Phosphatase: 60 U/L (ref 38–126)
Anion gap: 12 (ref 5–15)
BUN: 28 mg/dL — ABNORMAL HIGH (ref 8–23)
CO2: 22 mmol/L (ref 22–32)
Calcium: 9.7 mg/dL (ref 8.9–10.3)
Chloride: 105 mmol/L (ref 98–111)
Creatinine, Ser: 1.22 mg/dL — ABNORMAL HIGH (ref 0.44–1.00)
GFR, Estimated: 50 mL/min — ABNORMAL LOW (ref >60)
Glucose, Bld: 137 mg/dL — ABNORMAL HIGH (ref 70–99)
Potassium: 4.1 mmol/L (ref 3.5–5.1)
Sodium: 139 mmol/L (ref 135–145)
Total Bilirubin: 0.4 mg/dL (ref 0.3–1.2)
Total Protein: 7 g/dL (ref 6.5–8.1)

## 2020-09-20 LAB — TOTAL PROTEIN, URINE DIPSTICK: Protein, ur: NEGATIVE mg/dL

## 2020-09-20 LAB — CBC WITH DIFFERENTIAL (CANCER CENTER ONLY)
Abs Immature Granulocytes: 0.01 10*3/uL (ref 0.00–0.07)
Basophils Absolute: 0.1 10*3/uL (ref 0.0–0.1)
Basophils Relative: 1 %
Eosinophils Absolute: 0.5 10*3/uL (ref 0.0–0.5)
Eosinophils Relative: 8 %
HCT: 40.6 % (ref 36.0–46.0)
Hemoglobin: 13.9 g/dL (ref 12.0–15.0)
Immature Granulocytes: 0 %
Lymphocytes Relative: 34 %
Lymphs Abs: 2 10*3/uL (ref 0.7–4.0)
MCH: 31.4 pg (ref 26.0–34.0)
MCHC: 34.2 g/dL (ref 30.0–36.0)
MCV: 91.9 fL (ref 80.0–100.0)
Monocytes Absolute: 0.5 10*3/uL (ref 0.1–1.0)
Monocytes Relative: 8 %
Neutro Abs: 3 10*3/uL (ref 1.7–7.7)
Neutrophils Relative %: 49 %
Platelet Count: 249 10*3/uL (ref 150–400)
RBC: 4.42 MIL/uL (ref 3.87–5.11)
RDW: 11.9 % (ref 11.5–15.5)
WBC Count: 6 10*3/uL (ref 4.0–10.5)
nRBC: 0 % (ref 0.0–0.2)

## 2020-09-20 NOTE — Telephone Encounter (Signed)
She called and requested lab appt today at 1200 and move appts for Dr. Alvy Bimler and infusion to tomorrow. Appts moved. She is aware of appt times.

## 2020-09-21 ENCOUNTER — Inpatient Hospital Stay: Payer: 59

## 2020-09-21 ENCOUNTER — Inpatient Hospital Stay (HOSPITAL_BASED_OUTPATIENT_CLINIC_OR_DEPARTMENT_OTHER): Payer: 59 | Admitting: Hematology and Oncology

## 2020-09-21 ENCOUNTER — Encounter: Payer: Self-pay | Admitting: Hematology and Oncology

## 2020-09-21 VITALS — BP 137/83 | HR 56 | Temp 97.4°F | Resp 18 | Ht 66.0 in | Wt 190.5 lb

## 2020-09-21 VITALS — BP 128/74

## 2020-09-21 DIAGNOSIS — N183 Chronic kidney disease, stage 3 unspecified: Secondary | ICD-10-CM | POA: Diagnosis not present

## 2020-09-21 DIAGNOSIS — C569 Malignant neoplasm of unspecified ovary: Secondary | ICD-10-CM | POA: Diagnosis not present

## 2020-09-21 DIAGNOSIS — R232 Flushing: Secondary | ICD-10-CM

## 2020-09-21 DIAGNOSIS — Z7189 Other specified counseling: Secondary | ICD-10-CM

## 2020-09-21 DIAGNOSIS — N951 Menopausal and female climacteric states: Secondary | ICD-10-CM | POA: Diagnosis not present

## 2020-09-21 DIAGNOSIS — R110 Nausea: Secondary | ICD-10-CM | POA: Diagnosis not present

## 2020-09-21 DIAGNOSIS — Z5112 Encounter for antineoplastic immunotherapy: Secondary | ICD-10-CM | POA: Diagnosis not present

## 2020-09-21 DIAGNOSIS — M542 Cervicalgia: Secondary | ICD-10-CM | POA: Diagnosis not present

## 2020-09-21 MED ORDER — SODIUM CHLORIDE 0.9 % IV SOLN
15.0000 mg/kg | INTRAVENOUS | Status: DC
  Administered 2020-09-21: 1300 mg via INTRAVENOUS
  Filled 2020-09-21: qty 48

## 2020-09-21 MED ORDER — SODIUM CHLORIDE 0.9 % IV SOLN
Freq: Once | INTRAVENOUS | Status: AC
  Filled 2020-09-21: qty 250

## 2020-09-21 NOTE — Assessment & Plan Note (Signed)
Multifactorial, exacerbated by poor oral fluid intake I encouraged her to increase oral fluids as tolerated

## 2020-09-21 NOTE — Progress Notes (Signed)
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[facility]) She is doing well with recent resumption of bevacizumab Her documented blood pressure at home is satisfactory We will proceed with treatment as scheduled today I have given her instruction to schedule CT imaging to be done at the end of the month and then I will see her back for objective assessment of response to treatment  She is in agreement  CKD (chronic kidney disease), stage III Multifactorial, exacerbated by poor oral fluid intake I encouraged her to increase oral fluids as tolerated  Hot flashes She has mild hot flashes from treatment but tolerable Observe closely for now   Orders Placed This Encounter  Procedures  . CT Abdomen Pelvis Wo Contrast    Standing Status:   Future    Standing Expiration Date:   09/21/2021    Order Specific Question:   Preferred imaging location?    Answer:   [facility]    Order Specific Question:   Is Oral Contrast requested for this exam?    Answer:   Yes, Per Radiology protocol    All questions were answered. The patient knows to call the clinic with any problems, questions or concerns. The total time spent in the appointment was 20 minutes encounter with patients including review of chart and various tests results, discussions about plan of care and coordination of care plan   Heath Lark, MD 09/21/2020 12:59 PM  INTERVAL HISTORY: Please see below for problem oriented charting. She returns for further follow-up She complained of some minor neck pain and had recent x-ray which showed degenerative changes Denies abdominal pain, nausea or changes in bowel habits Her blood pressure control at home is fair She has some minor hot flashes from her Aromasin but not significant  SUMMARY OF ONCOLOGIC HISTORY: Oncology History Overview Note  2018 -  Core biopsy for ER/PR and Foundation One testing performed. Unfortunately, no sufficient tissue for Foundation One testing. ER was 50% and PR 90%. Progressed on carboplatin, Tamoxifen/Megace and letrozole, mixed response on Lupron  AMH: 06/23/19: 108 05/26/19: 9.04 01/05/19: 6.84 09/02/18: 3.72 06/01/18: 3.84 02/24/18: 2.6 11/21/17: 3.26 08/26/17: 1.77 05/27/17: 2.12 01/28/17: 2.47 06/10/16: 2.68 02/06/16: 1.84 05/12/15: 3.27 10/03/14: 2.12  Inhibin B 09/22/2019: 95.9 05/26/19: 90.2 01/05/19: 92 09/02/18: 70.9 06/04/18: 70 02/24/18: 79.5 11/21/17: 85.8 08/26/17: 65.6 05/27/17: 58.9 01/28/17: 198 06/10/16: 118.1 02/06/16: 62.4 05/12/15: 64.6 10/03/14: 58   Malignant granulosa cell tumor of ovary ([place])  1994 Initial Diagnosis   1994   2002 Relapse/Recurrence   Upper abdominal recurrence, resected.     - 09/2000 Chemotherapy   6 cycles of IP cisplatin and etoposide    02/2009 Relapse/Recurrence   CT showed increased size of nodules in pelvis   2010 Surgery   Exlap with section of tumor nodules near cecum and left pelvic sidewall. Tumor: ER negative, PR positive    Treatment Plan Change   Alternated 2 week courses of Megace and Tamoxifen - ended 03/2012   03/2012 PET scan   CT - progressive disease   2013 Treatment Plan Change   Letrozole   01/2016 Imaging   MRI showed progressive disease   2017 Treatment Plan Change   Two weeks of alternating Tamoxifen 48m daily and then Megace 490mTID   08/2016 Imaging   MRI showed progressive disease with peritoneal implants near liver, in  pelvis   01/2017 Treatment Plan Change   Lupron 11.25 q 3 months   11/2017 Imaging   Overall mixed response.   Mixed cystic/solid lesions in the left pelvis are mildly improved.   Cystic peritoneal disease, including the dominant lesion along the posterior right hepatic lobe, is mildly progressed.   Subcapsular lesion along the posterior right hepatic lobe is unchanged.   03/2018 Imaging   MRI  A/P: Mixed response of individual peritoneal metastases in the pelvis, as described above. Overall, there has been no significant change in bulk of disease.   Stable cystic peritoneal metastatic disease along the capsular surfaces of the liver and spleen.   No new sites of metastatic disease identified within the abdomen or pelvis.   08/2018 Imaging   MRI A/P: Status post hysterectomy and bilateral salpingo-oophorectomy.   Mixed cystic/solid peritoneal implants in the abdomen/pelvis, as above. Dominant cystic implant along the posterior liver surface is mildly increased. Remaining lesions are overall grossly unchanged.   No new lesions are identified.   01/28/2019 Imaging   Mri A/P: 1. Relatively similar appearance of peritoneal metastasis. A posterior right hepatic capsular based lesion is similar to minimally decreased in size. Left pelvic implants are primarily similar with possible enlargement of an anterior high left pelvic cystic implant. No new disease identified. 2.  Aortic Atherosclerosis (ICD10-I70.0). 3. Hepatic steatosis. 4. Left adrenal adenoma.   06/02/2019 Imaging   MRI 1. Potential slight enlargement of dominant cystic area and solid component, associated with rind like signal variation on T2 along the inferior right hepatic margin, also potentially slightly increased. Findings may still be within the realm of measurement and technical variation. Close attention on follow-up. 2. Subtle cystic changes along the cephalad margin of the spleen are difficult to see on previous imaging, perhaps new compared with prior imaging studies. 3. Pelvic implants and left lower quadrant lesion with similar size, of the area along the left iliac vasculature may be slightly larger than on the prior study. 4. Signs of extensive retroperitoneal and pelvic lymphadenectomy. 5. Hepatic steatosis. 6. 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      06/08/2019 Cancer Staging   Staging form: [place], AJCC 7th Edition - Clinical: Stage IIIC (rT2, N1, M0) - Signed by Heath Lark, MD on 06/08/2019   06/10/2019 Imaging   1. 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 These findings are slightly worsened in comparison to a remote prior CT examination dated 10/04/2014.   2.  No evidence of metastatic disease in the chest.   3. Status post hysterectomy, pelvic and retroperitoneal lymph node dissection, and ventral hernia mesh repair.   4.  Hepatic steatosis.   5.  Aortic Atherosclerosis (ICD10-I70.0).   06/24/2019 - 09/02/2019 Chemotherapy   The patient had carboplatin for chemotherapy treatment.     09/02/2019 Tumor Marker   Patient's tumor was tested for the following markers: Inhibin B Results of the tumor marker test revealed 94   09/23/2019 Imaging   1. Interval progression of the soft tissue lesions in the anterior left pelvis, likely peritoneal implants, compatible with disease progression. Remaining sites of apparent disease along the liver capsule and posterior spleen are stable 2. Stable 2 cm left adrenal adenoma. 3. Hepatic steatosis. 4. Right-side predominant colonic diverticulosis without diverticulitis. 5. Aortic  Atherosclerosis (ICD10-I70.0).   12/23/2019 Imaging   1. Slight interval increase in size of a mixed solid and cystic nodule in the left hemipelvis measuring 3.0 x 2.9 cm, previously 2.7 x 2.1 cm. 2. Interval decrease in size of a nodule in the left paracolic gutter measuring 1.0 x 0.8 cm, previously 2.1 x 1.6 cm. 3. Stable peritoneal nodules in the hepatorenal recess and at the inferior tip of the spleen. 4. Unchanged nodule or lymph node overlying the left external iliac artery. 5. Enlargement of dominant left pelvic nodule is concerning for  disease progression despite interval decrease in size of a nodule in the left paracolic gutter and stability of other nodules. 6. No evidence of metastatic disease in the chest. 7. Stable, benign left adrenal adenoma. 8. Ventral hernia mesh repair with a small component of recurrent hernia inferiorly, containing a single nonobstructed loop of small bowel. 9. Hepatic steatosis. 10. Aortic Atherosclerosis (ICD10-I70.0).   12/23/2019 Tumor Marker   Patient's tumor was tested for the following markers: Inhibin B Results of the tumor marker test revealed 94.9   01/25/2020 Tumor Marker   Patient's tumor was tested for the following markers: Inhibin B Results of the tumor marker test revealed 116.7   02/20/2020 Genetic Testing   Negative genetic testing: no pathogenic variants detected in [facility] Multi-Cancer Panel.  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  The report date is February 20, 2020.   The Multi-Cancer Panel offered by [facility] includes sequencing and/or deletion duplication testing of the following 85 genes: AIP, ALK, APC, ATM, AXIN2,BAP1,  BARD1, BLM, BMPR1A, BRCA1, BRCA2, BRIP1, CASR, CDC73, CDH1, CDK4, CDKN1B, CDKN1C, CDKN2A (p14ARF), CDKN2A (p16INK4a), CEBPA, CHEK2, CTNNA1, DICER1, DIS3L2, EGFR (c.2369C>T, p.Thr790Met variant only), EPCAM (Deletion/duplication testing only), FH, FLCN, GATA2, GPC3, GREM1 (Promoter region deletion/duplication testing only), HOXB13 (c.251G>A, p.Gly84Glu), HRAS, KIT, MAX, MEN1, MET, MITF (c.952G>A, p.Glu318Lys variant only), MLH1, MSH2, MSH3, MSH6, MUTYH, NBN, NF1, NF2, NTHL1, PALB2, PDGFRA, PHOX2B, PMS2, POLD1, POLE, POT1, PRKAR1A, PTCH1, PTEN, RAD50, RAD51C, RAD51D, RB1, RECQL4, RET, RNF43, RUNX1, SDHAF2, SDHA (sequence changes only), SDHB, SDHC, SDHD, SMAD4, SMARCA4, SMARCB1, SMARCE1, STK11, SUFU, TERC, TERT, TMEM127, TP53, TSC1, TSC2, VHL, WRN and WT1.    03/02/2020 Tumor Marker   Patient's tumor was tested for the following  markers: Inhibin B Results of the tumor marker test revealed 80.8   04/17/2020 Imaging   1. Overall, exam is stable. Multiple peritoneal nodules are again seen. The index nodule in the left lower quadrant is mildly increased in size in the interval. The lesion within the anterior left pelvis has decreased in size in the interval. There has also been decrease in size of cystic lesion within the a hepatorenal recess. The remaining peritoneal lesions are unchanged. No new lesions identified. 2. Stable left adrenal nodule. 3.  Aortic Atherosclerosis (ICD10-I70.0).     08/21/2020 Imaging   Bone density is normal AP spine T score 0.8 Femoral neck on the left, T score -0.2 Femoral neck on the right ,T score -0.4     REVIEW OF SYSTEMS:   Constitutional: Denies fevers, chills or abnormal weight loss Eyes: Denies blurriness of vision Ears, nose, mouth, throat, and face: Denies mucositis or sore throat Respiratory: Denies cough, dyspnea or wheezes Cardiovascular: Denies palpitation, chest discomfort or lower extremity swelling Gastrointestinal:  Denies nausea, heartburn or change in bowel habits Skin: Denies abnormal skin rashes Lymphatics: Denies new lymphadenopathy or easy bruising Neurological:Denies numbness, tingling or new weaknesses Behavioral/Psych: Mood is stable, no new changes  All other systems were reviewed with the patient and are negative.  I have reviewed the past medical history, past surgical history, social history and family history with the patient and they are unchanged from previous note.  ALLERGIES:  is allergic to codeine, hydrocodone-acetaminophen, other, oxycodone, sodium thiosalicylate, thimerosal, tramadol hcl, ciprofloxacin, codone [hydrocodone bitartrate], daypro [oxaprozin], levofloxacin, stadol [butorphanol tartrate], and sulfa antibiotics.  MEDICATIONS:  Current Outpatient Medications  Medication Sig Dispense Refill  . Bevacizumab (AVASTIN IV) Inject into the  vein. Every 3 weeks    . Biotin 10 MG CAPS Take by mouth.    Marland Kitchen buPROPion (WELLBUTRIN SR) 200 MG 12 hr tablet TAKE 1 TABLET BY MOUTH 2 TIMES DAILY 60 tablet 0  . Calcium Citrate 200 MG TABS Take by mouth.    . escitalopram (LEXAPRO) 20 MG tablet TAKE 1 TABLET BY MOUTH ONCE A DAY 90 tablet 3  . exemestane (AROMASIN) 25 MG tablet TAKE 1 TABLET (25 MG TOTAL) BY MOUTH DAILY AFTER BREAKFAST. 90 tablet 11  . metFORMIN (GLUCOPHAGE) 500 MG tablet Take 1 tablet (500 mg total) by mouth 2 (two) times daily with a meal. 60 tablet 0  . mirabegron ER (MYRBETRIQ) 25 MG TB24 tablet Take 25 mg by mouth daily.    . Multiple Vitamin (MULTIVITAMIN) capsule Take 1 capsule by mouth daily.    Marland Kitchen omeprazole (PRILOSEC) 20 MG capsule Take 20 mg by mouth daily.    . Probiotic Product (ALIGN PO) Take 1 tablet by mouth daily.     . Vitamin D, Ergocalciferol, (DRISDOL) 1.25 MG (50000 UNIT) CAPS capsule Take 50,000 Units by mouth every 7 (seven) days.     No current facility-administered medications for this visit.    PHYSICAL EXAMINATION: ECOG PERFORMANCE STATUS: 0 - Asymptomatic  Vitals:   09/21/20 1018  BP: 137/83  Pulse: (!) 56  Resp: 18  Temp: (!) 97.4 F (36.3 C)  SpO2: 98%   Filed Weights   09/21/20 1018  Weight: 190 lb 8 oz (86.4 kg)    GENERAL:alert, no distress and comfortable SKIN: skin color, texture, turgor are normal, no rashes or significant lesions EYES: normal, Conjunctiva are pink and non-injected, sclera clear OROPHARYNX:no exudate, no erythema and lips, buccal mucosa, and tongue normal  NECK: supple, thyroid normal size, non-tender, without nodularity LYMPH:  no palpable lymphadenopathy in the cervical, axillary or inguinal LUNGS: clear to auscultation and percussion with normal breathing effort HEART: regular rate & rhythm and no murmurs and no lower extremity edema ABDOMEN:abdomen soft, non-tender and normal bowel sounds Musculoskeletal:no cyanosis of digits and no clubbing  NEURO:  alert & oriented x 3 with fluent speech, no focal motor/sensory deficits  LABORATORY DATA:  I have reviewed the data as listed    Component Value Date/Time   NA 139 09/20/2020 1327   NA 139 11/30/2019 0913   NA 141 09/24/2013 1131   K 4.1 09/20/2020 1327   K 4.5 09/24/2013 1131   CL 105 09/20/2020 1327   CO2 22 09/20/2020 1327   CO2 24 09/24/2013 1131   GLUCOSE 137 (H) 09/20/2020 1327   GLUCOSE 90 09/24/2013 1131   BUN 28 (H) 09/20/2020 1327   BUN 32 (H) 11/30/2019 0913   BUN 21.2 09/24/2013 1131   CREATININE 1.22 (H) 09/20/2020 1327   CREATININE 1.18 (H) 12/21/2019 1259   CREATININE 1.1 09/24/2013 1131   CALCIUM 9.7 09/20/2020 1327   CALCIUM 9.5 09/24/2013 1131   PROT 7.0 09/20/2020 1327   PROT 7.0 11/30/2019 0913  PROT 6.8 09/24/2013 1131   ALBUMIN 3.8 09/20/2020 1327   ALBUMIN 4.3 11/30/2019 0913   ALBUMIN 3.9 09/24/2013 1131   AST 20 09/20/2020 1327   AST 18 12/21/2019 1259   AST 23 09/24/2013 1131   ALT 17 09/20/2020 1327   ALT 18 12/21/2019 1259   ALT 27 09/24/2013 1131   ALKPHOS 60 09/20/2020 1327   ALKPHOS 81 09/24/2013 1131   BILITOT 0.4 09/20/2020 1327   BILITOT 0.3 12/21/2019 1259   BILITOT 0.53 09/24/2013 1131   GFRNONAA 50 (L) 09/20/2020 1327   GFRNONAA 49 (L) 12/21/2019 1259   GFRAA 55 (L) 01/24/2020 1530   GFRAA 57 (L) 12/21/2019 1259    No results found for: SPEP, UPEP  Lab Results  Component Value Date   WBC 6.0 09/20/2020   NEUTROABS 3.0 09/20/2020   HGB 13.9 09/20/2020   HCT 40.6 09/20/2020   MCV 91.9 09/20/2020   PLT 249 09/20/2020      Chemistry      Component Value Date/Time   NA 139 09/20/2020 1327   NA 139 11/30/2019 0913   NA 141 09/24/2013 1131   K 4.1 09/20/2020 1327   K 4.5 09/24/2013 1131   CL 105 09/20/2020 1327   CO2 22 09/20/2020 1327   CO2 24 09/24/2013 1131   BUN 28 (H) 09/20/2020 1327   BUN 32 (H) 11/30/2019 0913   BUN 21.2 09/24/2013 1131   CREATININE 1.22 (H) 09/20/2020 1327   CREATININE 1.18 (H)  12/21/2019 1259   CREATININE 1.1 09/24/2013 1131      Component Value Date/Time   CALCIUM 9.7 09/20/2020 1327   CALCIUM 9.5 09/24/2013 1131   ALKPHOS 60 09/20/2020 1327   ALKPHOS 81 09/24/2013 1131   AST 20 09/20/2020 1327   AST 18 12/21/2019 1259   AST 23 09/24/2013 1131   ALT 17 09/20/2020 1327   ALT 18 12/21/2019 1259   ALT 27 09/24/2013 1131   BILITOT 0.4 09/20/2020 1327   BILITOT 0.3 12/21/2019 1259   BILITOT 0.53 09/24/2013 1131       RADIOGRAPHIC STUDIES: I have personally reviewed the radiological images as listed and agreed with the findings in the report. DG Cervical Spine 2 or 3 views  Result Date: 09/02/2020 CLINICAL DATA:  Bilateral neck and shoulder pain, no known injury, initial encounter EXAM: CERVICAL SPINE - 3 VIEW COMPARISON:  04/01/2005 MRI FINDINGS: Seven cervical segments are well visualized. Disc space narrowing and osteophytic changes are again noted at C5-6 and C6-7 somewhat progressed in the interval from the prior exam. Mild facet hypertrophic changes are noted as well. No acute fracture or acute facet abnormality is noted. The odontoid is within normal limits. No soft tissue abnormality is seen. IMPRESSION: Progressive degenerative changes at C5-6 and C6-7. Electronically Signed   By: Inez Catalina M.D.   On: 09/02/2020 12:04

## 2020-09-21 NOTE — Assessment & Plan Note (Signed)
She is doing well with recent resumption of bevacizumab Her documented blood pressure at home is satisfactory We will proceed with treatment as scheduled today I have given her instruction to schedule CT imaging to be done at the end of the month and then I will see her back for objective assessment of response to treatment  She is in agreement

## 2020-09-21 NOTE — Assessment & Plan Note (Addendum)
She has mild hot flashes from treatment but tolerable Observe closely for now

## 2020-09-21 NOTE — Patient Instructions (Signed)
[facility]  Discharge Instructions: °Thank you for choosing [facility] to provide your oncology and hematology care.  ° °If you have a lab appointment with the Cancer Center, please go directly to the Cancer Center and check in at the registration area. °  °Wear comfortable clothing and clothing appropriate for easy access to any Portacath or PICC line.  ° °We strive to give you quality time with your provider. You may need to reschedule your appointment if you arrive late (15 or more minutes).  Arriving late affects you and other patients whose appointments are after yours.  Also, if you miss three or more appointments without notifying the office, you may be dismissed from the clinic at the provider’s discretion.    °  °For prescription refill requests, have your pharmacy contact our office and allow 72 hours for refills to be completed.   ° °Today you received the following chemotherapy and/or immunotherapy agents: bevacizumab    °  °To help prevent nausea and vomiting after your treatment, we encourage you to take your nausea medication as directed. ° °BELOW ARE SYMPTOMS THAT SHOULD BE REPORTED IMMEDIATELY: °*FEVER GREATER THAN 100.4 F (38 °C) OR HIGHER °*CHILLS OR SWEATING °*NAUSEA AND VOMITING THAT IS NOT CONTROLLED WITH YOUR NAUSEA MEDICATION °*UNUSUAL SHORTNESS OF BREATH °*UNUSUAL BRUISING OR BLEEDING °*URINARY PROBLEMS (pain or burning when urinating, or frequent urination) °*BOWEL PROBLEMS (unusual diarrhea, constipation, pain near the anus) °TENDERNESS IN MOUTH AND THROAT WITH OR WITHOUT PRESENCE OF ULCERS (sore throat, sores in mouth, or a toothache) °UNUSUAL RASH, SWELLING OR PAIN  °UNUSUAL VAGINAL DISCHARGE OR ITCHING  ° °Items with * indicate a potential emergency and should be followed up as soon as possible or go to the Emergency Department if any problems should occur. ° °Please show the CHEMOTHERAPY ALERT CARD or IMMUNOTHERAPY ALERT CARD at check-in  to the Emergency Department and triage nurse. ° °Should you have questions after your visit or need to cancel or reschedule your appointment, please contact [facility]  Dept: [phone]  and follow the prompts.  Office hours are 8:00 a.m. to 4:30 p.m. Monday - Friday. Please note that voicemails left after 4:00 p.m. may not be returned until the following business day.  We are closed weekends and major holidays. You have access to a nurse at all times for urgent questions. Please call the main number to the clinic Dept: [phone] and follow the prompts. ° ° °For any non-urgent questions, you may also contact your provider using MyChart. We now offer e-Visits for anyone 18 and older to request care online for non-urgent symptoms. For details visit [email or web address]. °  °Also download the MyChart app! Go to the app store, search "MyChart", open the app, select [facility], and log in with your MyChart username and password. ° °Due to Covid, a mask is required upon entering the hospital/clinic. If you do not have a mask, one will be given to you upon arrival. For doctor visits, patients may have 1 support person aged 18 or older with them. For treatment visits, patients cannot have anyone with them due to current Covid guidelines and our immunocompromised population.  ° °

## 2020-09-22 ENCOUNTER — Inpatient Hospital Stay: Payer: 59

## 2020-09-22 ENCOUNTER — Inpatient Hospital Stay: Payer: 59 | Admitting: Hematology and Oncology

## 2020-09-22 MED ORDER — MAGNESIUM SULFATE 2 GM/50ML IV SOLN
INTRAVENOUS | Status: AC
  Filled 2020-09-22: qty 50

## 2020-09-22 MED ORDER — DIPHENHYDRAMINE HCL 25 MG PO CAPS
ORAL_CAPSULE | ORAL | Status: AC
  Filled 2020-09-22: qty 1

## 2020-09-22 MED ORDER — ACETAMINOPHEN 325 MG PO TABS
ORAL_TABLET | ORAL | Status: AC
  Filled 2020-09-22: qty 2

## 2020-09-27 DIAGNOSIS — H1033 Unspecified acute conjunctivitis, bilateral: Secondary | ICD-10-CM | POA: Diagnosis not present

## 2020-09-28 ENCOUNTER — Other Ambulatory Visit (HOSPITAL_COMMUNITY): Payer: Self-pay

## 2020-09-28 ENCOUNTER — Other Ambulatory Visit (INDEPENDENT_AMBULATORY_CARE_PROVIDER_SITE_OTHER): Payer: Self-pay | Admitting: Family Medicine

## 2020-09-28 DIAGNOSIS — E8881 Metabolic syndrome: Secondary | ICD-10-CM

## 2020-09-28 DIAGNOSIS — F3289 Other specified depressive episodes: Secondary | ICD-10-CM

## 2020-09-28 MED FILL — Escitalopram Oxalate Tab 20 MG (Base Equiv): ORAL | 90 days supply | Qty: 90 | Fill #0 | Status: AC

## 2020-09-28 NOTE — Telephone Encounter (Signed)
Last ssen by Dr. Raliegh Scarlet

## 2020-09-29 ENCOUNTER — Other Ambulatory Visit (HOSPITAL_COMMUNITY): Payer: Self-pay

## 2020-09-29 MED ORDER — OMEPRAZOLE 20 MG PO CPDR
DELAYED_RELEASE_CAPSULE | ORAL | 3 refills | Status: DC
  Filled 2020-09-29: qty 90, 90d supply, fill #0
  Filled 2021-01-12: qty 90, 90d supply, fill #1

## 2020-10-02 ENCOUNTER — Other Ambulatory Visit (HOSPITAL_COMMUNITY): Payer: Self-pay

## 2020-10-02 ENCOUNTER — Other Ambulatory Visit (INDEPENDENT_AMBULATORY_CARE_PROVIDER_SITE_OTHER): Payer: Self-pay | Admitting: Family Medicine

## 2020-10-02 ENCOUNTER — Telehealth: Payer: Self-pay

## 2020-10-02 DIAGNOSIS — F3289 Other specified depressive episodes: Secondary | ICD-10-CM

## 2020-10-02 DIAGNOSIS — H04322 Acute dacryocystitis of left lacrimal passage: Secondary | ICD-10-CM | POA: Diagnosis not present

## 2020-10-02 DIAGNOSIS — E8881 Metabolic syndrome: Secondary | ICD-10-CM

## 2020-10-02 DIAGNOSIS — H25013 Cortical age-related cataract, bilateral: Secondary | ICD-10-CM | POA: Diagnosis not present

## 2020-10-02 MED ORDER — NEOMYCIN-POLYMYXIN-DEXAMETH 0.1 % OP SUSP
OPHTHALMIC | 0 refills | Status: DC
  Filled 2020-10-02: qty 5, 25d supply, fill #0

## 2020-10-02 MED ORDER — NEOMYCIN-POLYMYXIN-DEXAMETH 0.1 % OP OINT
TOPICAL_OINTMENT | OPHTHALMIC | 0 refills | Status: DC
  Filled 2020-10-02: qty 3.5, 5d supply, fill #0

## 2020-10-02 NOTE — Telephone Encounter (Signed)
Called and left a message appt scheduled 6/28 at 1 pm. Ask her to call the office back if this appt is not convenient.

## 2020-10-02 NOTE — Telephone Encounter (Signed)
DR Raliegh Scarlet

## 2020-10-02 NOTE — Telephone Encounter (Signed)
Last seen 5/9, next visit 6/27. Okay to refill?

## 2020-10-02 NOTE — Telephone Encounter (Signed)
-----   Message from Heath Lark, MD sent at 10/02/2020  7:43 AM EDT ----- Regarding: non urgent Can you schedule rtn visit on 6/28 to review test results? 1 pm, 45 mins

## 2020-10-02 NOTE — Telephone Encounter (Signed)
Is this okay to refill? 

## 2020-10-03 ENCOUNTER — Other Ambulatory Visit (HOSPITAL_COMMUNITY): Payer: Self-pay

## 2020-10-03 DIAGNOSIS — M542 Cervicalgia: Secondary | ICD-10-CM | POA: Diagnosis not present

## 2020-10-03 MED ORDER — METHOCARBAMOL 500 MG PO TABS
ORAL_TABLET | ORAL | 1 refills | Status: DC
  Filled 2020-10-03: qty 42, 21d supply, fill #0

## 2020-10-03 MED ORDER — PREDNISONE 5 MG (21) PO TBPK
ORAL_TABLET | ORAL | 1 refills | Status: DC
  Filled 2020-10-03: qty 21, 6d supply, fill #0

## 2020-10-05 ENCOUNTER — Encounter (INDEPENDENT_AMBULATORY_CARE_PROVIDER_SITE_OTHER): Payer: Self-pay | Admitting: Family Medicine

## 2020-10-05 ENCOUNTER — Other Ambulatory Visit: Payer: Self-pay

## 2020-10-05 ENCOUNTER — Ambulatory Visit (INDEPENDENT_AMBULATORY_CARE_PROVIDER_SITE_OTHER): Payer: 59 | Admitting: Family Medicine

## 2020-10-05 ENCOUNTER — Telehealth: Payer: Self-pay

## 2020-10-05 ENCOUNTER — Other Ambulatory Visit (HOSPITAL_COMMUNITY): Payer: Self-pay

## 2020-10-05 VITALS — BP 150/77 | HR 60 | Temp 98.3°F | Ht 66.0 in | Wt 185.0 lb

## 2020-10-05 DIAGNOSIS — F3289 Other specified depressive episodes: Secondary | ICD-10-CM | POA: Diagnosis not present

## 2020-10-05 DIAGNOSIS — Z6833 Body mass index (BMI) 33.0-33.9, adult: Secondary | ICD-10-CM | POA: Diagnosis not present

## 2020-10-05 DIAGNOSIS — E8881 Metabolic syndrome: Secondary | ICD-10-CM | POA: Diagnosis not present

## 2020-10-05 DIAGNOSIS — E669 Obesity, unspecified: Secondary | ICD-10-CM | POA: Diagnosis not present

## 2020-10-05 DIAGNOSIS — Z9189 Other specified personal risk factors, not elsewhere classified: Secondary | ICD-10-CM

## 2020-10-05 MED ORDER — METFORMIN HCL 500 MG PO TABS
500.0000 mg | ORAL_TABLET | Freq: Two times a day (BID) | ORAL | 0 refills | Status: DC
  Filled 2020-10-05: qty 60, 30d supply, fill #0

## 2020-10-05 MED ORDER — BUPROPION HCL ER (SR) 200 MG PO TB12
ORAL_TABLET | Freq: Two times a day (BID) | ORAL | 0 refills | Status: DC
  Filled 2020-10-05: qty 60, 30d supply, fill #0

## 2020-10-05 NOTE — Telephone Encounter (Signed)
She called and left a message appt scheduled on 6/28 is okay with her.

## 2020-10-16 ENCOUNTER — Other Ambulatory Visit: Payer: Self-pay

## 2020-10-16 ENCOUNTER — Ambulatory Visit (HOSPITAL_COMMUNITY)
Admission: RE | Admit: 2020-10-16 | Discharge: 2020-10-16 | Disposition: A | Discharge status: Home / Self Care | Payer: 59 | Source: Ambulatory Visit | Attending: Hematology and Oncology | Admitting: Hematology and Oncology

## 2020-10-16 ENCOUNTER — Telehealth: Payer: Self-pay

## 2020-10-16 ENCOUNTER — Ambulatory Visit (INDEPENDENT_AMBULATORY_CARE_PROVIDER_SITE_OTHER): Payer: 59 | Admitting: Family Medicine

## 2020-10-16 ENCOUNTER — Encounter (HOSPITAL_COMMUNITY): Payer: Self-pay

## 2020-10-16 DIAGNOSIS — C569 Malignant neoplasm of unspecified ovary: Secondary | ICD-10-CM | POA: Diagnosis not present

## 2020-10-16 DIAGNOSIS — N200 Calculus of kidney: Secondary | ICD-10-CM | POA: Diagnosis not present

## 2020-10-16 DIAGNOSIS — N281 Cyst of kidney, acquired: Secondary | ICD-10-CM | POA: Diagnosis not present

## 2020-10-16 DIAGNOSIS — K769 Liver disease, unspecified: Secondary | ICD-10-CM | POA: Diagnosis not present

## 2020-10-16 NOTE — Telephone Encounter (Signed)
She called and complaining of having nausea. She is for scan today. She will attempt to drink the water based contrast. If she is unable to drink the contrast she will cancel appts. I offered to send something for nausea. She declined.  Dr. Alvy Bimler given above message.

## 2020-10-16 NOTE — Progress Notes (Signed)
Chief Complaint:   OBESITY [name] is here to discuss her progress with her obesity treatment plan along with follow-up of her obesity related diagnoses. [name] is on the Category 3 Plan and states she is following her eating plan approximately 50% of the time. [name] states she is swimming 60 minutes 2 times per week.  Today's visit was #: 64 Starting weight: 209 lbs Starting date: 04/29/2018 Today's weight: 185 lbs Today's date: 10/05/2020 Total lbs lost to date: 24 Total lbs lost since last in-office visit: 2  Interim History: [name] has been busy at work, not eating much; not hungry. Breakfast is 1 yogurt and Special K and milk. Lunch is granola bar or oatmeal (yesterday- shrimp and grits). She is going on vacation for 8 days in [place] tomorrow. It has been 5 weeks since her last OV.  Assessment/Plan:   1. Insulin resistance At goal. Goal is HgbA1c < 5.7, fasting insulin closer to 5.  Medication: Metformin, but has been off for 2 weeks or so. She reports no difference on increase.    Plan:  She will continue to focus on protein-rich, low simple carbohydrate foods. We reviewed the importance of hydration, regular exercise for stress reduction, and restorative sleep.   Lab Results  Component Value Date   HGBA1C 5.2 11/30/2019   Lab Results  Component Value Date   INSULIN 23.8 11/30/2019   INSULIN 16.5 05/31/2019   INSULIN 17.6 11/19/2018   INSULIN 25.8 (H) 04/29/2018   Refill- metFORMIN (GLUCOPHAGE) 500 MG tablet; Take 1 tablet (500 mg total) by mouth 2 (two) times daily with a meal.  Dispense: 60 tablet; Refill: 0  2. Other depression, with emotional eating Controlled. Medication: Wellbutrin, but has been off for 1 week. [name] denies side effects.  Behavior modification techniques were discussed today to help deal with emotional/non-hunger eating behaviors.  Refill- buPROPion (WELLBUTRIN SR) 200 MG 12 hr tablet; TAKE 1 TABLET BY MOUTH 2 TIMES DAILY  Dispense: 60 tablet;  Refill: 0  3. At risk for malnutrition [name] was given extensive malnutrition prevention education and counseling today of more than 15 minutes.  Counseled her that malnutrition refers to inappropriate nutrients or not the right balance of nutrients for optimal health.  Discussed with [name] that it is absolutely possible to be malnourished but yet obese.  Risk factors, including but not limited to, inappropriate dietary choices, difficulty with obtaining food due to physical or financial limitations, and various physical and mental health conditions were reviewed with [name].   4. Obesity with current BMI of 30.0  [name] is currently in the action stage of change. As such, her goal is to continue with weight loss efforts. She has agreed to the Category 2 Plan with breakfast options- Pt feels she would eat more of the foods on the plan if it wasn't so much food.   Exercise goals:  As is  Behavioral modification strategies: no skipping meals, holiday eating strategies , and planning for success.  [name] has agreed to follow-up with our clinic in 2-3 weeks. She was informed of the importance of frequent follow-up visits to maximize her success with intensive lifestyle modifications for her multiple health conditions.   Objective:   Blood pressure (!) 150/77, pulse 60, temperature 98.3 F (36.8 C), height 5\' 6"  (1.676 m), weight 185 lb (83.9 kg), SpO2 96 %. Body mass index is 29.86 kg/m.  General: Cooperative, alert, well developed, in no acute distress. HEENT: Conjunctivae and lids  unremarkable. Cardiovascular: Regular rhythm.  Lungs: Normal work of breathing. Neurologic: No focal deficits.   Lab Results  Component Value Date   CREATININE 1.22 (H) 09/20/2020   BUN 28 (H) 09/20/2020   NA 139 09/20/2020   K 4.1 09/20/2020   CL 105 09/20/2020   CO2 22 09/20/2020   Lab Results  Component Value Date   ALT 17 09/20/2020   AST 20 09/20/2020   ALKPHOS 60 09/20/2020   BILITOT  0.4 09/20/2020   Lab Results  Component Value Date   HGBA1C 5.2 11/30/2019   HGBA1C 5.3 05/31/2019   HGBA1C 5.2 11/19/2018   HGBA1C 5.5 04/29/2018   Lab Results  Component Value Date   INSULIN 23.8 11/30/2019   INSULIN 16.5 05/31/2019   INSULIN 17.6 11/19/2018   INSULIN 25.8 (H) 04/29/2018   Lab Results  Component Value Date   TSH 0.707 11/30/2019   Lab Results  Component Value Date   CHOL 224 (H) 11/30/2019   HDL 52 11/30/2019   LDLCALC 136 (H) 11/30/2019   TRIG 201 (H) 11/30/2019   Lab Results  Component Value Date   WBC 6.0 09/20/2020   HGB 13.9 09/20/2020   HCT 40.6 09/20/2020   MCV 91.9 09/20/2020   PLT 249 09/20/2020    Attestation Statements:   Reviewed by clinician on day of visit: allergies, medications, problem list, medical history, surgical history, family history, social history, and previous encounter notes.  Coral Ceo, CMA, am acting as transcriptionist for [employer], DO.  I have reviewed the above documentation for accuracy and completeness, and I agree with the above. Marjory Sneddon, D.O.  [place] was signed into law in 2016 which includes the topic of electronic health records.  This provides immediate access to information in MyChart.  This includes consultation notes, operative notes, office notes, lab results and pathology reports.  If you have any questions about what you read please let us know at your next visit so we can discuss your concerns and take corrective action if need be.  We are right here with you.

## 2020-10-17 ENCOUNTER — Encounter: Payer: Self-pay | Admitting: Hematology and Oncology

## 2020-10-17 ENCOUNTER — Other Ambulatory Visit (HOSPITAL_COMMUNITY): Payer: Self-pay

## 2020-10-17 ENCOUNTER — Inpatient Hospital Stay (HOSPITAL_BASED_OUTPATIENT_CLINIC_OR_DEPARTMENT_OTHER): Payer: 59 | Admitting: Hematology and Oncology

## 2020-10-17 VITALS — BP 156/80 | HR 62 | Temp 97.7°F | Resp 18 | Ht 66.0 in | Wt 188.0 lb

## 2020-10-17 DIAGNOSIS — C569 Malignant neoplasm of unspecified ovary: Secondary | ICD-10-CM | POA: Diagnosis not present

## 2020-10-17 DIAGNOSIS — N951 Menopausal and female climacteric states: Secondary | ICD-10-CM | POA: Diagnosis not present

## 2020-10-17 DIAGNOSIS — Z5112 Encounter for antineoplastic immunotherapy: Secondary | ICD-10-CM | POA: Diagnosis not present

## 2020-10-17 DIAGNOSIS — M542 Cervicalgia: Secondary | ICD-10-CM | POA: Diagnosis not present

## 2020-10-17 DIAGNOSIS — N183 Chronic kidney disease, stage 3 unspecified: Secondary | ICD-10-CM | POA: Diagnosis not present

## 2020-10-17 DIAGNOSIS — Z7189 Other specified counseling: Secondary | ICD-10-CM | POA: Diagnosis not present

## 2020-10-17 DIAGNOSIS — R110 Nausea: Secondary | ICD-10-CM | POA: Diagnosis not present

## 2020-10-17 MED ORDER — LIDOCAINE-PRILOCAINE 2.5-2.5 % EX CREA
TOPICAL_CREAM | CUTANEOUS | 3 refills | Status: DC
  Filled 2020-10-17: qty 30, 30d supply, fill #0

## 2020-10-17 NOTE — Assessment & Plan Note (Signed)
She has significant neck pain She has no response to recent treatment with steroids and acetaminophen We discussed the risk and benefits of MRI evaluation and she is in agreement

## 2020-10-17 NOTE — Assessment & Plan Note (Signed)
Unfortunately, she has signs of disease progression She is also symptomatic At this point in time, I do not recommend the patient to delay chemotherapy further I recommend port placement over the next 2 weeks and to start after the holidays We reviewed the guidelines We discussed the risk and benefits of single agent Taxol versus Taxotere Ultimately, we are in agreement to try to start single agent Taxol I will see her prior to second dose of treatment

## 2020-10-17 NOTE — Progress Notes (Signed)
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

## 2020-10-17 NOTE — Assessment & Plan Note (Signed)
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

## 2020-10-17 NOTE — Progress Notes (Signed)
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[facility]) Unfortunately, she has signs of disease progression She is also symptomatic At this point in time, I do not recommend the patient to delay chemotherapy further I recommend port placement over the next 2 weeks and to start after the holidays We reviewed the guidelines We discussed the risk and benefits of single agent Taxol versus Taxotere Ultimately, we are in agreement to try to start single agent Taxol I will see her prior to second dose of treatment  Cervical pain (neck) She has significant neck pain She has no response to recent treatment with steroids and acetaminophen We discussed the risk and benefits of MRI evaluation and she is in agreement  Nausea without vomiting 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    Answer:   No Sedation    Order Specific Question:   Does the patient have a pacemaker or implanted devices?    Answer:   No    Order Specific Question:   Preferred imaging location?    Answer:   [facility] (table limit - 550 lbs)   IR IMAGING GUIDED PORT INSERTION    Standing Status:   Future    Standing Expiration Date:   10/17/2021    Order Specific Question:   Reason for Exam (SYMPTOM  OR DIAGNOSIS REQUIRED)    Answer:   nened port to start chemo on 7/13    Order Specific Question:   Preferred Imaging Location?    Answer:   [facility]    CBC with Differential ([place] Only)    Standing Status:   Standing    Number of Occurrences:   20    Standing Expiration Date:   10/17/2021   CMP ([place] only)    Standing Status:   Standing    Number of Occurrences:   20    Standing Expiration Date:   10/17/2021    All questions were answered. The patient knows to call the clinic with any problems, questions or concerns. The total time spent in the appointment was 40 minutes encounter with patients including review of chart and various tests results, discussions about plan of care and coordination of care plan   Heath Lark, MD 10/17/2020 11:44 AM  INTERVAL HISTORY: Please see below for problem oriented charting. She returns for further follow-up She has mild intermittent nausea without vomiting She has lost appetite She complained of severe neck pain No recent changes in bowel habits  SUMMARY OF ONCOLOGIC HISTORY: Oncology History Overview Note  2018 - Core biopsy for ER/PR and Foundation One testing performed. Unfortunately, no sufficient tissue for Foundation One testing. ER was 50% and PR 90%. Progressed on carboplatin, exemestane, Avastin,Tamoxifen/Megace and letrozole, mixed response on Lupron  AMH: 06/23/19: 108 05/26/19: 9.04 01/05/19: 6.84 09/02/18: 3.72 06/01/18: 3.84 02/24/18: 2.6 11/21/17: 3.26 08/26/17: 1.77 05/27/17: 2.12 01/28/17:  2.47 06/10/16: 2.68 02/06/16: 1.84 05/12/15: 3.27 10/03/14: 2.12  Inhibin B 09/22/2019: 95.9 05/26/19: 90.2 01/05/19: 92 09/02/18: 70.9 06/04/18: 70 02/24/18: 79.5 11/21/17: 85.8 08/26/17: 65.6 05/27/17: 58.9 01/28/17: 198 06/10/16: 118.1 02/06/16: 62.4 05/12/15: 64.6 10/03/14: 58   Malignant granulosa cell tumor of ovary ([place])  1994 Initial Diagnosis   1994   2002 Relapse/Recurrence   Upper abdominal recurrence, resected.     - 09/2000 Chemotherapy   6 cycles of IP cisplatin and etoposide    02/2009 Relapse/Recurrence   CT showed increased size of nodules in pelvis   2010  Surgery   Exlap with section of tumor nodules near cecum and left pelvic sidewall. Tumor: ER negative, PR positive    Treatment Plan Change   Alternated 2 week courses of Megace and Tamoxifen - ended 03/2012   03/2012 PET scan   CT - progressive disease   2013 Treatment Plan Change   Letrozole   01/2016 Imaging   MRI showed progressive disease   2017 Treatment Plan Change   Two weeks of alternating Tamoxifen 23m daily and then Megace 466mTID   08/2016 Imaging   MRI showed progressive disease with peritoneal implants near liver, in pelvis   01/2017 Treatment Plan Change   Lupron 11.25 q 3 months   11/2017 Imaging   Overall mixed response.   Mixed cystic/solid lesions in the left pelvis are mildly improved.   Cystic peritoneal disease, including the dominant lesion along the posterior right hepatic lobe, is mildly progressed.   Subcapsular lesion along the posterior right hepatic lobe is unchanged.   03/2018 Imaging   MRI A/P: Mixed response of individual peritoneal metastases in the pelvis, as described above. Overall, there has been no significant change in bulk of disease.   Stable cystic peritoneal metastatic disease along the capsular surfaces of the liver and spleen.   No new sites of metastatic disease identified within the abdomen or pelvis.   08/2018 Imaging   MRI A/P: Status post hysterectomy and bilateral salpingo-oophorectomy.   Mixed cystic/solid peritoneal implants in the abdomen/pelvis, as above. Dominant cystic implant along the posterior liver surface is mildly increased. Remaining lesions are overall grossly unchanged.   No new lesions are identified.   01/28/2019 Imaging   Mri A/P: 1. Relatively similar appearance of peritoneal metastasis. A posterior right hepatic capsular based lesion is similar to minimally decreased in size. Left pelvic implants are primarily similar with possible enlargement of an anterior high left pelvic cystic implant. No new  disease identified. 2.  Aortic Atherosclerosis (ICD10-I70.0). 3. Hepatic steatosis. 4. Left adrenal adenoma.   06/02/2019 Imaging   MRI 1. Potential slight enlargement of dominant cystic area and solid component, associated with rind like signal variation on T2 along the inferior right hepatic margin, also potentially slightly increased. Findings may still be within the realm of measurement and technical variation. Close attention on follow-up. 2. Subtle cystic changes along the cephalad margin of the spleen are difficult to see on previous imaging, perhaps new compared with prior imaging studies. 3. Pelvic implants and left lower quadrant lesion with similar size, of the area along the left iliac vasculature may be slightly larger than on the prior study. 4. Signs of extensive retroperitoneal and pelvic lymphadenectomy. 5. Hepatic steatosis. 6. 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     06/08/2019 Cancer Staging   Staging form: [place], AJCC 7th Edition - Clinical: Stage IIIC (rT2, N1, M0) - Signed  by Heath Lark, MD on 06/08/2019    06/10/2019 Imaging   1. 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 These findings are slightly worsened in comparison to a remote prior CT examination dated 10/04/2014.   2.  No evidence of metastatic disease in the chest.   3. Status post hysterectomy, pelvic and retroperitoneal lymph node dissection, and ventral hernia mesh repair.   4.  Hepatic steatosis.   5.  Aortic Atherosclerosis (ICD10-I70.0).   06/24/2019 - 09/02/2019 Chemotherapy   The patient had carboplatin for chemotherapy treatment.     09/02/2019 Tumor Marker   Patient's tumor was tested for the following markers: Inhibin B Results of the tumor marker test revealed  94   09/23/2019 Imaging   1. Interval progression of the soft tissue lesions in the anterior left pelvis, likely peritoneal implants, compatible with disease progression. Remaining sites of apparent disease along the liver capsule and posterior spleen are stable 2. Stable 2 cm left adrenal adenoma. 3. Hepatic steatosis. 4. Right-side predominant colonic diverticulosis without diverticulitis. 5. Aortic Atherosclerosis (ICD10-I70.0).   12/23/2019 Imaging   1. Slight interval increase in size of a mixed solid and cystic nodule in the left hemipelvis measuring 3.0 x 2.9 cm, previously 2.7 x 2.1 cm. 2. Interval decrease in size of a nodule in the left paracolic gutter measuring 1.0 x 0.8 cm, previously 2.1 x 1.6 cm. 3. Stable peritoneal nodules in the hepatorenal recess and at the inferior tip of the spleen. 4. Unchanged nodule or lymph node overlying the left external iliac artery. 5. Enlargement of dominant left pelvic nodule is concerning for disease progression despite interval decrease in size of a nodule in the left paracolic gutter and stability of other nodules. 6. No evidence of metastatic disease in the chest. 7. Stable, benign left adrenal adenoma. 8. Ventral hernia mesh repair with a small component of recurrent hernia inferiorly, containing a single nonobstructed loop of small bowel. 9. Hepatic steatosis. 10. Aortic Atherosclerosis (ICD10-I70.0).   12/23/2019 Tumor Marker   Patient's tumor was tested for the following markers: Inhibin B Results of the tumor marker test revealed 94.9   01/25/2020 Tumor Marker   Patient's tumor was tested for the following markers: Inhibin B Results of the tumor marker test revealed 116.7   02/20/2020 Genetic Testing   Negative genetic testing: no pathogenic variants detected in [facility] Multi-Cancer Panel.  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  The report date is February 20, 2020.   The Multi-Cancer Panel offered by  [facility] includes sequencing and/or deletion duplication testing of the following 85 genes: AIP, ALK, APC, ATM, AXIN2,BAP1,  BARD1, BLM, BMPR1A, BRCA1, BRCA2, BRIP1, CASR, CDC73, CDH1, CDK4, CDKN1B, CDKN1C, CDKN2A (p14ARF), CDKN2A (p16INK4a), CEBPA, CHEK2, CTNNA1, DICER1, DIS3L2, EGFR (c.2369C>T, p.Thr790Met variant only), EPCAM (Deletion/duplication testing only), FH, FLCN, GATA2, GPC3, GREM1 (Promoter region deletion/duplication testing only), HOXB13 (c.251G>A, p.Gly84Glu), HRAS, KIT, MAX, MEN1, MET, MITF (c.952G>A, p.Glu318Lys variant only), MLH1, MSH2, MSH3, MSH6, MUTYH, NBN, NF1, NF2, NTHL1, PALB2, PDGFRA, PHOX2B, PMS2, POLD1, POLE, POT1, PRKAR1A, PTCH1, PTEN, RAD50, RAD51C, RAD51D, RB1, RECQL4, RET, RNF43, RUNX1, SDHAF2, SDHA (sequence changes only), SDHB, SDHC, SDHD, SMAD4, SMARCA4, SMARCB1, SMARCE1, STK11, SUFU, TERC, TERT, TMEM127, TP53, TSC1, TSC2, VHL, WRN and WT1.    03/02/2020 Tumor Marker   Patient's tumor was tested for the following markers: Inhibin B  Results of the tumor marker test revealed 80.8   04/17/2020 Imaging   1. Overall, exam is stable. Multiple peritoneal nodules are again seen. The index nodule in the left lower quadrant is mildly increased in size in the interval. The lesion within the anterior left pelvis has decreased in size in the interval. There has also been decrease in size of cystic lesion within the a hepatorenal recess. The remaining peritoneal lesions are unchanged. No new lesions identified. 2. Stable left adrenal nodule. 3.  Aortic Atherosclerosis (ICD10-I70.0).     08/21/2020 Imaging   Bone density is normal AP spine T score 0.8 Femoral neck on the left, T score -0.2 Femoral neck on the right ,T score -0.4   10/17/2020 Imaging   1. Interval progression of peritoneal nodules along the left pelvic sidewall, concerning for progressive metastatic disease. 2. Small capsular lesion medial right liver and the apparent cystic lesion superior to the right kidney are  similar to prior. 3. Tiny soft tissue nodule anterior aspect of the lateral left pelvis described as decreasing on the prior study has decreased further on today's exam. 4. Stable left adrenal nodule. This cannot be definitively characterized. 5. Tiny nonobstructing stone lower pole right kidney. 6. Aortic Atherosclerosis (ICD10-I70.0).   11/01/2020 -  Chemotherapy    Patient is on Treatment Plan: OVARIAN PACLITAXEL V6,9,45,03 Q28D         REVIEW OF SYSTEMS:   Constitutional: Denies fevers, chills or abnormal weight loss Eyes: Denies blurriness of vision Ears, nose, mouth, throat, and face: Denies mucositis or sore throat Respiratory: Denies cough, dyspnea or wheezes Cardiovascular: Denies palpitation, chest discomfort or lower extremity swelling Skin: Denies abnormal skin rashes Lymphatics: Denies new lymphadenopathy or easy bruising Neurological:Denies numbness, tingling or new weaknesses Behavioral/Psych: Mood is stable, no new changes  All other systems were reviewed with the patient and are negative.  I have reviewed the past medical history, past surgical history, social history and family history with the patient and they are unchanged from previous note.  ALLERGIES:  is allergic to codeine, hydrocodone-acetaminophen, other, oxycodone, sodium thiosalicylate, thimerosal, tramadol hcl, ciprofloxacin, codone [hydrocodone bitartrate], daypro [oxaprozin], levofloxacin, stadol [butorphanol tartrate], and sulfa antibiotics.  MEDICATIONS:  Current Outpatient Medications  Medication Sig Dispense Refill   Bevacizumab (AVASTIN IV) Inject into the vein. Every 3 weeks     Biotin 10 MG CAPS Take by mouth.     buPROPion (WELLBUTRIN SR) 200 MG 12 hr tablet TAKE 1 TABLET BY MOUTH 2 TIMES DAILY 60 tablet 0   Calcium Citrate 200 MG TABS Take by mouth.     escitalopram (LEXAPRO) 20 MG tablet TAKE 1 TABLET BY MOUTH ONCE A DAY 90 tablet 3   exemestane (AROMASIN) 25 MG tablet TAKE 1 TABLET (25  MG TOTAL) BY MOUTH DAILY AFTER BREAKFAST. 90 tablet 11   lidocaine-prilocaine (EMLA) cream Apply to affected area once 30 g 3   metFORMIN (GLUCOPHAGE) 500 MG tablet Take 1 tablet (500 mg total) by mouth 2 (two) times daily with a meal. 60 tablet 0   methocarbamol (ROBAXIN) 500 MG tablet Take 1 tablet by mouth twice daily as needed for 21 days 42 tablet 1   mirabegron ER (MYRBETRIQ) 25 MG TB24 tablet Take 25 mg by mouth daily.     Multiple Vitamin (MULTIVITAMIN) capsule Take 1 capsule by mouth daily.     neomycin-polymyxin-dexameth (MAXITROL) 0.1 % OINT Apply a small amount to left eye every evening 3.5 g 0   omeprazole (PRILOSEC) 20 MG  capsule Take 1 capsule by mouth once daily 90 capsule 3   predniSONE (STERAPRED UNI-PAK 21 TAB) 5 MG (21) TBPK tablet Take as directed per package instructions for 6 days. 21 tablet 1   Probiotic Product (ALIGN PO) Take 1 tablet by mouth daily.      No current facility-administered medications for this visit.    PHYSICAL EXAMINATION: ECOG PERFORMANCE STATUS: 1 - Symptomatic but completely ambulatory  Vitals:   10/17/20 1052  BP: (!) 156/80  Pulse: 62  Resp: 18  Temp: 97.7 F (36.5 C)  SpO2: 100%   Filed Weights   10/17/20 1052  Weight: 188 lb (85.3 kg)    GENERAL:alert, no distress and comfortable NEURO: alert & oriented x 3 with fluent speech, no focal motor/sensory deficits  LABORATORY DATA:  I have reviewed the data as listed    Component Value Date/Time   NA 139 09/20/2020 1327   NA 139 11/30/2019 0913   NA 141 09/24/2013 1131   K 4.1 09/20/2020 1327   K 4.5 09/24/2013 1131   CL 105 09/20/2020 1327   CO2 22 09/20/2020 1327   CO2 24 09/24/2013 1131   GLUCOSE 137 (H) 09/20/2020 1327   GLUCOSE 90 09/24/2013 1131   BUN 28 (H) 09/20/2020 1327   BUN 32 (H) 11/30/2019 0913   BUN 21.2 09/24/2013 1131   CREATININE 1.22 (H) 09/20/2020 1327   CREATININE 1.18 (H) 12/21/2019 1259   CREATININE 1.1 09/24/2013 1131   CALCIUM 9.7 09/20/2020  1327   CALCIUM 9.5 09/24/2013 1131   PROT 7.0 09/20/2020 1327   PROT 7.0 11/30/2019 0913   PROT 6.8 09/24/2013 1131   ALBUMIN 3.8 09/20/2020 1327   ALBUMIN 4.3 11/30/2019 0913   ALBUMIN 3.9 09/24/2013 1131   AST 20 09/20/2020 1327   AST 18 12/21/2019 1259   AST 23 09/24/2013 1131   ALT 17 09/20/2020 1327   ALT 18 12/21/2019 1259   ALT 27 09/24/2013 1131   ALKPHOS 60 09/20/2020 1327   ALKPHOS 81 09/24/2013 1131   BILITOT 0.4 09/20/2020 1327   BILITOT 0.3 12/21/2019 1259   BILITOT 0.53 09/24/2013 1131   GFRNONAA 50 (L) 09/20/2020 1327   GFRNONAA 49 (L) 12/21/2019 1259   GFRAA 55 (L) 01/24/2020 1530   GFRAA 57 (L) 12/21/2019 1259    No results found for: SPEP, UPEP  Lab Results  Component Value Date   WBC 6.0 09/20/2020   NEUTROABS 3.0 09/20/2020   HGB 13.9 09/20/2020   HCT 40.6 09/20/2020   MCV 91.9 09/20/2020   PLT 249 09/20/2020      Chemistry      Component Value Date/Time   NA 139 09/20/2020 1327   NA 139 11/30/2019 0913   NA 141 09/24/2013 1131   K 4.1 09/20/2020 1327   K 4.5 09/24/2013 1131   CL 105 09/20/2020 1327   CO2 22 09/20/2020 1327   CO2 24 09/24/2013 1131   BUN 28 (H) 09/20/2020 1327   BUN 32 (H) 11/30/2019 0913   BUN 21.2 09/24/2013 1131   CREATININE 1.22 (H) 09/20/2020 1327   CREATININE 1.18 (H) 12/21/2019 1259   CREATININE 1.1 09/24/2013 1131      Component Value Date/Time   CALCIUM 9.7 09/20/2020 1327   CALCIUM 9.5 09/24/2013 1131   ALKPHOS 60 09/20/2020 1327   ALKPHOS 81 09/24/2013 1131   AST 20 09/20/2020 1327   AST 18 12/21/2019 1259   AST 23 09/24/2013 1131   ALT 17 09/20/2020 1327  ALT 18 12/21/2019 1259   ALT 27 09/24/2013 1131   BILITOT 0.4 09/20/2020 1327   BILITOT 0.3 12/21/2019 1259   BILITOT 0.53 09/24/2013 1131       RADIOGRAPHIC STUDIES: I have reviewed multiple imaging studies with the patient I have personally reviewed the radiological images as listed and agreed with the findings in the report. CT Abdomen  Pelvis Wo Contrast  Result Date: 10/17/2020 CLINICAL DATA:  Ovarian cancer.  Restaging EXAM: CT ABDOMEN AND PELVIS WITHOUT CONTRAST TECHNIQUE: Multidetector CT imaging of the abdomen and pelvis was performed following the standard protocol without IV contrast. COMPARISON:  04/17/2020 FINDINGS: Lower chest: Unremarkable Hepatobiliary: No focal abnormality in the liver on this study without intravenous contrast. 17 mm capsular lesion medial right liver on 22/2 is similar in best seen on coronal 63/4. Well-defined 3.4 x 3.2 cm homogeneous low-density lesion at the superior pole right kidney was 3.4 x 3.2 cm previously. There is no evidence for gallstones, gallbladder wall thickening, or pericholecystic fluid. No intrahepatic or extrahepatic biliary dilation. Pancreas: No focal mass lesion. No dilatation of the main duct. No intraparenchymal cyst. No peripancreatic edema. Spleen: No splenomegaly. 1.3 cm soft tissue nodule at the inferior tip of the spleen was 1.4 cm (remeasured) previously. Adrenals/Urinary Tract: Right adrenal gland unremarkable. Stable left adrenal nodule. This cannot be definitively characterized but is likely benign. Exophytic cyst upper pole right kidney is similar to prior. Tiny nonobstructing stone noted lower pole right kidney. Left kidney unremarkable. No evidence for hydroureter. The urinary bladder appears normal for the degree of distention. Stomach/Bowel: Stomach is unremarkable. No gastric wall thickening. No evidence of outlet obstruction. Duodenum is normally positioned as is the ligament of Treitz. Insert normal small The terminal ileum is normal. The appendix is not well visualized, but there is no edema or inflammation in the region of the cecum. No gross colonic mass. No colonic wall thickening. Vascular/Lymphatic: There is abdominal aortic atherosclerosis without aneurysm. There is no gastrohepatic or hepatoduodenal ligament lymphadenopathy. No retroperitoneal or mesenteric  lymphadenopathy. No pelvic sidewall lymphadenopathy. Reproductive: Uterus surgically absent.  There is no adnexal mass. Other: No intraperitoneal free fluid. Peritoneal nodule along the left pelvic sidewall measured previously at 1.9 x 1.7 cm is now 2.1 x 2.0 cm (image 77/2). A second peritoneal nodule higher in the left pelvis measured previously at 1.5 x 1.3 cm now measures 2.1 x 1.7 cm (63/2) Nodule in the anterior and lateral left pelvis measured previously at 1.4 x 1.1 cm is 1.1 x 1.0 cm on image 64/2 today. Musculoskeletal: Mesh again noted lower anterior abdominal wall with tiny recurrent midline hernia along the inferior margin containing small bowel without complicating features. Overall appearance is similar to prior. No worrisome lytic or sclerotic osseous abnormality. IMPRESSION: 1. Interval progression of peritoneal nodules along the left pelvic sidewall, concerning for progressive metastatic disease. 2. Small capsular lesion medial right liver and the apparent cystic lesion superior to the right kidney are similar to prior. 3. Tiny soft tissue nodule anterior aspect of the lateral left pelvis described as decreasing on the prior study has decreased further on today's exam. 4. Stable left adrenal nodule. This cannot be definitively characterized. 5. Tiny nonobstructing stone lower pole right kidney. 6. Aortic Atherosclerosis (ICD10-I70.0). Electronically Signed   By: Misty Stanley M.D.   On: 10/17/2020 09:29

## 2020-10-19 ENCOUNTER — Encounter: Payer: Self-pay | Admitting: Hematology and Oncology

## 2020-10-20 ENCOUNTER — Other Ambulatory Visit: Payer: Self-pay | Admitting: Radiology

## 2020-10-20 ENCOUNTER — Other Ambulatory Visit: Payer: Self-pay

## 2020-10-20 ENCOUNTER — Telehealth: Payer: Self-pay

## 2020-10-20 ENCOUNTER — Ambulatory Visit (HOSPITAL_COMMUNITY)
Admission: RE | Admit: 2020-10-20 | Discharge: 2020-10-20 | Disposition: A | Discharge status: Home / Self Care | Payer: 59 | Source: Ambulatory Visit | Attending: Hematology and Oncology | Admitting: Hematology and Oncology

## 2020-10-20 ENCOUNTER — Other Ambulatory Visit: Payer: Self-pay | Admitting: Hematology and Oncology

## 2020-10-20 ENCOUNTER — Other Ambulatory Visit (HOSPITAL_COMMUNITY): Payer: Self-pay

## 2020-10-20 DIAGNOSIS — C569 Malignant neoplasm of unspecified ovary: Secondary | ICD-10-CM | POA: Insufficient documentation

## 2020-10-20 DIAGNOSIS — M47812 Spondylosis without myelopathy or radiculopathy, cervical region: Secondary | ICD-10-CM | POA: Diagnosis not present

## 2020-10-20 DIAGNOSIS — M542 Cervicalgia: Secondary | ICD-10-CM | POA: Insufficient documentation

## 2020-10-20 DIAGNOSIS — M2578 Osteophyte, vertebrae: Secondary | ICD-10-CM | POA: Diagnosis not present

## 2020-10-20 DIAGNOSIS — M4802 Spinal stenosis, cervical region: Secondary | ICD-10-CM | POA: Diagnosis not present

## 2020-10-20 DIAGNOSIS — Z8543 Personal history of malignant neoplasm of ovary: Secondary | ICD-10-CM | POA: Diagnosis not present

## 2020-10-20 MED ORDER — CEPHALEXIN 500 MG PO CAPS
500.0000 mg | ORAL_CAPSULE | Freq: Three times a day (TID) | ORAL | 0 refills | Status: DC
  Filled 2020-10-20: qty 15, 5d supply, fill #0

## 2020-10-20 MED ORDER — GADOBUTROL 1 MMOL/ML IV SOLN
9.0000 mL | Freq: Once | INTRAVENOUS | Status: AC | PRN
  Administered 2020-10-20: 9 mL via INTRAVENOUS

## 2020-10-20 NOTE — Telephone Encounter (Signed)
I told her I sent prescription to [facility] I will check on her again next week

## 2020-10-20 NOTE — Telephone Encounter (Signed)
Returned her call. She got a bug bite to her upper arm last night that is red and swollen. The area looks like it is tracking down her arm.  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

## 2020-10-24 ENCOUNTER — Telehealth: Payer: Self-pay

## 2020-10-24 ENCOUNTER — Other Ambulatory Visit: Payer: Self-pay | Admitting: Student

## 2020-10-24 ENCOUNTER — Inpatient Hospital Stay: Payer: 59 | Attending: Hematology

## 2020-10-24 DIAGNOSIS — C569 Malignant neoplasm of unspecified ovary: Secondary | ICD-10-CM | POA: Insufficient documentation

## 2020-10-24 DIAGNOSIS — K760 Fatty (change of) liver, not elsewhere classified: Secondary | ICD-10-CM | POA: Insufficient documentation

## 2020-10-24 DIAGNOSIS — Z5111 Encounter for antineoplastic chemotherapy: Secondary | ICD-10-CM | POA: Insufficient documentation

## 2020-10-24 DIAGNOSIS — N183 Chronic kidney disease, stage 3 unspecified: Secondary | ICD-10-CM | POA: Insufficient documentation

## 2020-10-24 DIAGNOSIS — R110 Nausea: Secondary | ICD-10-CM | POA: Diagnosis not present

## 2020-10-24 MED ORDER — SODIUM CHLORIDE 0.9 % IV SOLN
INTRAVENOUS | Status: DC
  Filled 2020-10-24 (×2): qty 250

## 2020-10-24 MED ORDER — PROCHLORPERAZINE EDISYLATE 10 MG/2ML IJ SOLN
INTRAMUSCULAR | Status: AC
  Filled 2020-10-24: qty 2

## 2020-10-24 MED ORDER — PROCHLORPERAZINE EDISYLATE 10 MG/2ML IJ SOLN
10.0000 mg | Freq: Once | INTRAMUSCULAR | Status: AC
  Administered 2020-10-24: 10 mg via INTRAVENOUS

## 2020-10-24 MED ORDER — PROCHLORPERAZINE MALEATE 10 MG PO TABS: 10.0000 mg | ORAL_TABLET | Freq: Once | ORAL | Status: DC

## 2020-10-24 MED ORDER — PROCHLORPERAZINE MALEATE 10 MG PO TABS
ORAL_TABLET | ORAL | Status: AC
  Filled 2020-10-24: qty 1

## 2020-10-24 NOTE — Telephone Encounter (Signed)
Returned her call. All of the sudden she started having nausea and has vomited x 1. She said she felt this way about 1 month ago. Complaining of a severe headache.  She is still at work and does not feel she can go home at this time.

## 2020-10-24 NOTE — Telephone Encounter (Signed)
I agree with IVF and IV compazine I asked Loren to update me later if she needs anything

## 2020-10-24 NOTE — Telephone Encounter (Signed)
Scheduled for IV fluids at 1200 today. She prefers Compazine po instead of IV. She is aware of appt and taken to infusion.  Vomited a couple of times now. Put in a order for IV compazine due to being unable to take po.

## 2020-10-24 NOTE — Telephone Encounter (Signed)
Called to see how the area on her arm is today. Redness and swelling is much better. Still itching at times. Per Dr. Alvy Bimler, MRI of neck showed degenerative arthritis. She verbalized understanding.  Ask her to call the office for concerns. She verbalized understanding.

## 2020-10-25 ENCOUNTER — Telehealth: Payer: Self-pay

## 2020-10-25 ENCOUNTER — Ambulatory Visit: Payer: 59

## 2020-10-25 ENCOUNTER — Ambulatory Visit (HOSPITAL_COMMUNITY)
Admission: RE | Admit: 2020-10-25 | Discharge: 2020-10-25 | Disposition: A | Discharge status: Home / Self Care | Payer: 59 | Source: Ambulatory Visit | Attending: Hematology and Oncology | Admitting: Hematology and Oncology

## 2020-10-25 ENCOUNTER — Encounter (HOSPITAL_COMMUNITY): Payer: Self-pay

## 2020-10-25 DIAGNOSIS — Z882 Allergy status to sulfonamides status: Secondary | ICD-10-CM | POA: Diagnosis not present

## 2020-10-25 DIAGNOSIS — Z888 Allergy status to other drugs, medicaments and biological substances status: Secondary | ICD-10-CM | POA: Insufficient documentation

## 2020-10-25 DIAGNOSIS — Z885 Allergy status to narcotic agent status: Secondary | ICD-10-CM | POA: Insufficient documentation

## 2020-10-25 DIAGNOSIS — Z79899 Other long term (current) drug therapy: Secondary | ICD-10-CM | POA: Insufficient documentation

## 2020-10-25 DIAGNOSIS — Z881 Allergy status to other antibiotic agents status: Secondary | ICD-10-CM | POA: Diagnosis not present

## 2020-10-25 DIAGNOSIS — C569 Malignant neoplasm of unspecified ovary: Secondary | ICD-10-CM | POA: Insufficient documentation

## 2020-10-25 DIAGNOSIS — Z452 Encounter for adjustment and management of vascular access device: Secondary | ICD-10-CM | POA: Diagnosis not present

## 2020-10-25 DIAGNOSIS — Z7984 Long term (current) use of oral hypoglycemic drugs: Secondary | ICD-10-CM | POA: Insufficient documentation

## 2020-10-25 HISTORY — PX: IR IMAGING GUIDED PORT INSERTION: IMG5740

## 2020-10-25 MED ORDER — MIDAZOLAM HCL 2 MG/2ML IJ SOLN
INTRAMUSCULAR | Status: AC | PRN
  Administered 2020-10-25: 1 mg via INTRAVENOUS

## 2020-10-25 MED ORDER — LIDOCAINE-EPINEPHRINE 1 %-1:100000 IJ SOLN
INTRAMUSCULAR | Status: AC
  Filled 2020-10-25: qty 1

## 2020-10-25 MED ORDER — MIDAZOLAM HCL 2 MG/2ML IJ SOLN
INTRAMUSCULAR | Status: AC
  Filled 2020-10-25: qty 4

## 2020-10-25 MED ORDER — SODIUM CHLORIDE 0.9 % IV SOLN: INTRAVENOUS | Status: DC

## 2020-10-25 MED ORDER — FENTANYL CITRATE (PF) 100 MCG/2ML IJ SOLN
INTRAMUSCULAR | Status: AC | PRN
  Administered 2020-10-25: 50 ug via INTRAVENOUS

## 2020-10-25 MED ORDER — HEPARIN SOD (PORK) LOCK FLUSH 100 UNIT/ML IV SOLN
INTRAVENOUS | Status: AC | PRN
  Administered 2020-10-25: 500 [IU] via INTRAVENOUS

## 2020-10-25 MED ORDER — LIDOCAINE-EPINEPHRINE 2 %-1:100000 IJ SOLN
INTRAMUSCULAR | Status: AC | PRN
  Administered 2020-10-25: 30 mL

## 2020-10-25 MED ORDER — HEPARIN SOD (PORK) LOCK FLUSH 100 UNIT/ML IV SOLN
INTRAVENOUS | Status: AC
  Filled 2020-10-25: qty 5

## 2020-10-25 MED ORDER — FENTANYL CITRATE (PF) 100 MCG/2ML IJ SOLN
INTRAMUSCULAR | Status: AC
  Filled 2020-10-25: qty 4

## 2020-10-25 NOTE — Progress Notes (Signed)
Ambulated in the hallway and to the bathroom to void tol well.

## 2020-10-25 NOTE — H&P (Signed)
Chief Complaint: Ovarian cancer  Referring Physician(s): Alvy Bimler  Supervising Physician: Mir, Sharen Heck  Patient Status: [facility] - Out-pt  History of Present Illness: [name] is a 65 y.o. 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  CT done on 10/17/20 showed= Interval progression of peritoneal nodules along the left pelvic sidewall, concerning for progressive metastatic disease.  She is here today for placement of a tunneled catheter with port.  She is NPO. She c/o neck pain, some nausea = which she takes compazine. No Fever/chills. Otherwise ROS negative.   Past Medical History:  Diagnosis Date   Anxiety    Arthritis    Cervical syndrome    CKD (chronic kidney disease), stage III (HCC)    Constipation    COVID-19 03/26/2019   Family history of colon cancer 02/15/2020   Fatty liver    GERD (gastroesophageal reflux disease)    Granulosa cell tumor of ovary    History of hiatal hernia    Joint pain    Obesity    Osteoarthritis    Ovarian cancer ([place])    PONV (postoperative nausea and vomiting)    history of n/v,  past surgeries no n/v   Sleep apnea     Past Surgical History:  Procedure Laterality Date   ABDOMINAL HYSTERECTOMY     TAH/BSO   APPENDECTOMY     EXPLORATORY LAPAROTOMY     for bowel obstruction   left toe surgery Left    Secondary tumor debulking  2002   SHOULDER SURGERY     2017 left   Tumor debulking  2010   VENTRAL HERNIA REPAIR      Allergies: Codeine, Hydrocodone-acetaminophen, Oxycodone, Sodium thiosalicylate, Thimerosal, Tramadol hcl, Ciprofloxacin, Daypro [oxaprozin], Levofloxacin, Stadol [butorphanol tartrate], and Sulfa antibiotics  Medications: Prior to Admission medications   Medication Sig Start Date End Date Taking? Authorizing Provider  acetaminophen (TYLENOL) 500 MG tablet Take 1,000 mg by mouth every 6 (six) hours as needed for moderate pain.   Yes [provider]  Biotin 5 MG TABS Take 5 mg by  mouth daily.   Yes [provider]  buPROPion (WELLBUTRIN SR) 200 MG 12 hr tablet TAKE 1 TABLET BY MOUTH 2 TIMES DAILY Patient taking differently: Take 200 mg by mouth 2 (two) times daily. 10/05/20 10/05/21 Yes Opalski, Neoma Laming, DO  Ca Phosphate-Cholecalciferol (EQL CALCIUM GUMMIES) 250-400 MG-UNIT CHEW Chew 1 tablet by mouth 2 (two) times daily.   Yes [provider]  escitalopram (LEXAPRO) 20 MG tablet TAKE 1 TABLET BY MOUTH ONCE A DAY Patient taking differently: Take 20 mg by mouth daily. 07/03/20 07/03/21 Yes [name], MD  metFORMIN (GLUCOPHAGE) 500 MG tablet Take 1 tablet (500 mg total) by mouth 2 (two) times daily with a meal. 10/05/20  Yes Opalski, Deborah, DO  mirabegron ER (MYRBETRIQ) 50 MG TB24 tablet Take 50 mg by mouth daily.   Yes [provider]  omeprazole (PRILOSEC) 20 MG capsule Take 1 capsule by mouth once daily Patient taking differently: Take 20 mg by mouth daily. 09/29/20  Yes   ondansetron (ZOFRAN) 8 MG tablet Take 8 mg by mouth every 8 (eight) hours as needed for nausea or vomiting.   Yes [provider]  Polyethyl Glycol-Propyl Glycol (SYSTANE OP) Place 1 drop into both eyes daily as needed (dry eyes).   Yes [provider]  Probiotic Product (ALIGN PO) Take 1 tablet by mouth daily.    Yes [provider]  prochlorperazine (COMPAZINE)  10 MG tablet Take 10 mg by mouth every 6 (six) hours as needed for nausea or vomiting.   Yes [provider]  cephALEXin (KEFLEX) 500 MG capsule Take 1 capsule (500 mg total) by mouth 3 (three) times daily. Patient not taking: No sig reported 10/20/20   Heath Lark, MD  exemestane (AROMASIN) 25 MG tablet TAKE 1 TABLET (25 MG TOTAL) BY MOUTH DAILY AFTER BREAKFAST. Patient not taking: No sig reported 04/25/20 04/25/21  Heath Lark, MD  lidocaine-prilocaine (EMLA) cream Apply to affected area once Patient taking differently: Apply 1 application topically daily as needed (port access).  10/17/20   Heath Lark, MD  methocarbamol (ROBAXIN) 500 MG tablet Take 1 tablet by mouth twice daily as needed for 21 days Patient not taking: No sig reported 10/03/20     neomycin-polymyxin-dexameth (MAXITROL) 0.1 % OINT Apply a small amount to left eye every evening Patient not taking: No sig reported 10/02/20     predniSONE (STERAPRED UNI-PAK 21 TAB) 5 MG (21) TBPK tablet Take as directed per package instructions for 6 days. Patient not taking: No sig reported 10/03/20        Family History  Problem Relation Age of Onset   Basal cell carcinoma Mother        14s   Thyroid disease Mother    Stroke Father    Colon cancer Father 7   Hypertension Father    Heart disease Father    Colon cancer Paternal Uncle        dx 6s   Colon cancer Cousin        maternal cousin; dx 46s    Social History   Socioeconomic History   Marital status: Married    Spouse name: Pierrette Scheu   Number of children: 2   Years of education: Not on file   Highest education level: Not on file  Occupational History   Occupation: RN  Tobacco Use   Smoking status: Never   Smokeless tobacco: Never  Vaping Use   Vaping Use: Never used  Substance and Sexual Activity   Alcohol use: Yes    Comment: One 8oz glass, socially   Drug use: No   Sexual activity: Not Currently  Other Topics Concern   Not on file  Social History Narrative   Not on file   Social Determinants of Health   Financial Resource Strain: Not on file  Food Insecurity: Not on file  Transportation Needs: Not on file  Physical Activity: Not on file  Stress: Not on file  Social Connections: Not on file     Review of Systems: A 12 point ROS discussed and pertinent positives are indicated in the HPI above.  All other systems are negative.  Review of Systems  Vital Signs: BP 126/66   Pulse (!) 52   Temp 98 F (36.7 C) (Oral)   Ht 5' 5.5" (1.664 m)   Wt 85.3 kg   SpO2 98%   BMI 30.81 kg/m   Physical Exam Vitals reviewed.   Constitutional:      Appearance: Normal appearance.  HENT:     Head: Normocephalic and atraumatic.  Eyes:     Extraocular Movements: Extraocular movements intact.  Cardiovascular:     Rate and Rhythm: Normal rate and regular rhythm.  Pulmonary:     Effort: Pulmonary effort is normal. No respiratory distress.     Breath sounds: Normal breath sounds.  Abdominal:     General: There is no distension.  Palpations: Abdomen is soft.     Tenderness: There is no abdominal tenderness.  Musculoskeletal:        General: Normal range of motion.  Skin:    General: Skin is warm and dry.  Neurological:     General: No focal deficit present.     Mental Status: She is alert and oriented to person, place, and time.  Psychiatric:        Mood and Affect: Mood normal.        Behavior: Behavior normal.        Thought Content: Thought content normal.        Judgment: Judgment normal.    Imaging: CT Abdomen Pelvis Wo Contrast  Result Date: 10/17/2020 CLINICAL DATA:  Ovarian cancer.  Restaging EXAM: CT ABDOMEN AND PELVIS WITHOUT CONTRAST TECHNIQUE: Multidetector CT imaging of the abdomen and pelvis was performed following the standard protocol without IV contrast. COMPARISON:  04/17/2020 FINDINGS: Lower chest: Unremarkable Hepatobiliary: No focal abnormality in the liver on this study without intravenous contrast. 17 mm capsular lesion medial right liver on 22/2 is similar in best seen on coronal 63/4. Well-defined 3.4 x 3.2 cm homogeneous low-density lesion at the superior pole right kidney was 3.4 x 3.2 cm previously. There is no evidence for gallstones, gallbladder wall thickening, or pericholecystic fluid. No intrahepatic or extrahepatic biliary dilation. Pancreas: No focal mass lesion. No dilatation of the main duct. No intraparenchymal cyst. No peripancreatic edema. Spleen: No splenomegaly. 1.3 cm soft tissue nodule at the inferior tip of the spleen was 1.4 cm (remeasured) previously.  Adrenals/Urinary Tract: Right adrenal gland unremarkable. Stable left adrenal nodule. This cannot be definitively characterized but is likely benign. Exophytic cyst upper pole right kidney is similar to prior. Tiny nonobstructing stone noted lower pole right kidney. Left kidney unremarkable. No evidence for hydroureter. The urinary bladder appears normal for the degree of distention. Stomach/Bowel: Stomach is unremarkable. No gastric wall thickening. No evidence of outlet obstruction. Duodenum is normally positioned as is the ligament of Treitz. Insert normal small The terminal ileum is normal. The appendix is not well visualized, but there is no edema or inflammation in the region of the cecum. No gross colonic mass. No colonic wall thickening. Vascular/Lymphatic: There is abdominal aortic atherosclerosis without aneurysm. There is no gastrohepatic or hepatoduodenal ligament lymphadenopathy. No retroperitoneal or mesenteric lymphadenopathy. No pelvic sidewall lymphadenopathy. Reproductive: Uterus surgically absent.  There is no adnexal mass. Other: No intraperitoneal free fluid. Peritoneal nodule along the left pelvic sidewall measured previously at 1.9 x 1.7 cm is now 2.1 x 2.0 cm (image 77/2). A second peritoneal nodule higher in the left pelvis measured previously at 1.5 x 1.3 cm now measures 2.1 x 1.7 cm (63/2) Nodule in the anterior and lateral left pelvis measured previously at 1.4 x 1.1 cm is 1.1 x 1.0 cm on image 64/2 today. Musculoskeletal: Mesh again noted lower anterior abdominal wall with tiny recurrent midline hernia along the inferior margin containing small bowel without complicating features. Overall appearance is similar to prior. No worrisome lytic or sclerotic osseous abnormality. IMPRESSION: 1. Interval progression of peritoneal nodules along the left pelvic sidewall, concerning for progressive metastatic disease. 2. Small capsular lesion medial right liver and the apparent cystic lesion  superior to the right kidney are similar to prior. 3. Tiny soft tissue nodule anterior aspect of the lateral left pelvis described as decreasing on the prior study has decreased further on today's exam. 4. Stable left adrenal nodule. This cannot be definitively  characterized. 5. Tiny nonobstructing stone lower pole right kidney. 6. Aortic Atherosclerosis (ICD10-I70.0). Electronically Signed   By: Misty Stanley M.D.   On: 10/17/2020 09:29   MR CERVICAL SPINE W WO CONTRAST  Result Date: 10/22/2020 CLINICAL DATA:  Neck pain. History of ovarian cancer EXAM: MRI CERVICAL SPINE WITHOUT AND WITH CONTRAST TECHNIQUE: Multiplanar and multiecho pulse sequences of the cervical spine, to include the craniocervical junction and cervicothoracic junction, were obtained without and with intravenous contrast. CONTRAST:  62mL GADAVIST GADOBUTROL 1 MMOL/ML IV SOLN COMPARISON:  03/22/2005 FINDINGS: Alignment: Physiologic. Vertebrae: No acute fracture, evidence of discitis, or aggressive bone lesion. Cord: Normal signal and morphology. Posterior Fossa, vertebral arteries, paraspinal tissues: No acute paraspinal abnormality. Disc levels: Discs: Degenerative disease with disc height loss at C5-6 and C6-7. C2-3: No significant disc bulge. No neural foraminal stenosis. No central canal stenosis. C3-4: No significant disc bulge. Mild right foraminal narrowing. No left foraminal narrowing. Mild left facet arthropathy. C4-5: No significant disc bulge. No neural foraminal stenosis. No central canal stenosis. C5-6: Broad-based disc osteophyte complex. Bilateral uncovertebral degenerative changes. Mild left foraminal stenosis. Severe right foraminal stenosis. Mild spinal stenosis. C6-7: Broad-based disc osteophyte complex. Bilateral uncovertebral degenerative changes. Severe right foraminal stenosis. Mild left foraminal stenosis. No spinal stenosis. C7-T1: No significant disc bulge. No neural foraminal stenosis. No central canal stenosis.  IMPRESSION: 1. At C5-6 there is a broad-based disc osteophyte complex. Bilateral uncovertebral degenerative changes. Mild left foraminal stenosis. Severe right foraminal stenosis. Mild spinal stenosis. 2. At C6-7 there is a broad-based disc osteophyte complex. Bilateral uncovertebral degenerative changes. Mild left foraminal stenosis. Severe right foraminal stenosis. 3. No aggressive osseous lesion to suggest malignancy/metastatic disease. Electronically Signed   By: Kathreen Devoid   On: 10/22/2020 08:54    Labs:  CBC: Recent Labs    07/05/20 1412 07/27/20 1043 08/28/20 1549 09/20/20 1327  WBC 5.3 5.7 5.5 6.0  HGB 14.2 14.9 14.3 13.9  HCT 41.3 42.9 41.4 40.6  PLT 273 265 251 249    COAGS: No results for input(s): INR, APTT in the last 8760 hours.  BMP: Recent Labs    11/30/19 0913 11/30/19 0913 12/21/19 1259 01/05/20 0749 01/24/20 1530 02/09/20 1507 07/05/20 1412 07/27/20 1043 08/28/20 1549 09/20/20 1327  NA 139   < > 137 138 139   < > 139 135 139 139  K 4.8  --  4.3 4.5 4.2   < > 4.2 4.8 4.6 4.1  CL 98  --  102 102 105   < > 103 100 104 105  CO2 23  --  28 28 28    < > 27 23 23 22   GLUCOSE 100*   < > 103* 92 92   < > 101* 85 97 137*  BUN 32*   < > 29* 27* 34*   < > 25* 35* 36* 28*  CALCIUM 10.1  --  9.8 9.5 9.7   < > 9.4 9.6 9.5 9.7  CREATININE 1.19*  --  1.18* 1.20* 1.22*   < > 1.17* 1.27* 1.44* 1.22*  GFRNONAA 49*  --  49* 48* 47*   < > 52* 48* 41* 50*  GFRAA 57*  --  57* 56* 55*  --   --   --   --   --    < > = values in this interval not displayed.    LIVER FUNCTION TESTS: Recent Labs    07/05/20 1412 07/27/20 1043 08/28/20 1549 09/20/20 1327  BILITOT 0.3  0.6 0.4 0.4  AST 17 20 21 20   ALT 18 18 15 17   ALKPHOS 60 61 55 60  PROT 7.2 7.5 7.1 7.0  ALBUMIN 3.9 4.2 4.0 3.8    TUMOR MARKERS: No results for input(s): AFPTM, CEA, CA199, CHROMGRNA in the last 8760 hours.  Assessment and Plan:  Malignant granulosa cell tumor of the ovary with interval  progression of peritoneal nodules along the left pelvic sidewall, concerning for progressive metastatic disease.  Will proceed with image guided placement of a tunneled catheter with port today by Dr. Dwaine Gale.  Risks and benefits of image guided port-a-catheter placement was discussed with the patient including, but not limited to bleeding, infection, pneumothorax, or fibrin sheath development and need for additional procedures.  All of the patient's questions were answered, patient is agreeable to proceed. Consent signed and in chart.  Thank you for this interesting consult.  I greatly enjoyed meeting [name] and look forward to participating in their care.  A copy of this report was sent to the requesting provider on this date.  Electronically Signed: Murrell Redden, PA-C   10/25/2020, 10:04 AM      I spent a total of  15 Minutes  in face to face in clinical consultation, greater than 50% of which was counseling/coordinating care for [facility] A cath placement.

## 2020-10-25 NOTE — Telephone Encounter (Signed)
Called and left a message asking her to call the office back. 

## 2020-10-25 NOTE — Procedures (Signed)
Interventional Radiology Procedure Note  Procedure: Chest port  Indication: Ovarian Ca  Findings: Please refer to procedural dictation for full description.  Complications: None  EBL: < 10 mL  Miachel Roux, MD [phone]

## 2020-10-25 NOTE — Progress Notes (Signed)
Discharge instructions reviewed with pt and her husband. Both voice understanding.  

## 2020-10-30 ENCOUNTER — Encounter: Payer: Self-pay | Admitting: Hematology and Oncology

## 2020-10-31 ENCOUNTER — Other Ambulatory Visit: Payer: Self-pay

## 2020-10-31 ENCOUNTER — Inpatient Hospital Stay: Payer: 59

## 2020-10-31 VITALS — BP 155/63 | HR 56 | Temp 98.6°F | Resp 16

## 2020-10-31 DIAGNOSIS — Z7189 Other specified counseling: Secondary | ICD-10-CM

## 2020-10-31 DIAGNOSIS — C569 Malignant neoplasm of unspecified ovary: Secondary | ICD-10-CM

## 2020-10-31 DIAGNOSIS — K760 Fatty (change of) liver, not elsewhere classified: Secondary | ICD-10-CM | POA: Diagnosis not present

## 2020-10-31 DIAGNOSIS — Z5111 Encounter for antineoplastic chemotherapy: Secondary | ICD-10-CM | POA: Diagnosis not present

## 2020-10-31 DIAGNOSIS — N183 Chronic kidney disease, stage 3 unspecified: Secondary | ICD-10-CM | POA: Diagnosis not present

## 2020-10-31 DIAGNOSIS — R110 Nausea: Secondary | ICD-10-CM | POA: Diagnosis not present

## 2020-10-31 LAB — CBC WITH DIFFERENTIAL (CANCER CENTER ONLY)
Abs Immature Granulocytes: 0.01 10*3/uL (ref 0.00–0.07)
Basophils Absolute: 0 10*3/uL (ref 0.0–0.1)
Basophils Relative: 1 %
Eosinophils Absolute: 0.2 10*3/uL (ref 0.0–0.5)
Eosinophils Relative: 4 %
HCT: 41.2 % (ref 36.0–46.0)
Hemoglobin: 14.2 g/dL (ref 12.0–15.0)
Immature Granulocytes: 0 %
Lymphocytes Relative: 32 %
Lymphs Abs: 1.7 10*3/uL (ref 0.7–4.0)
MCH: 31.7 pg (ref 26.0–34.0)
MCHC: 34.5 g/dL (ref 30.0–36.0)
MCV: 92 fL (ref 80.0–100.0)
Monocytes Absolute: 0.5 10*3/uL (ref 0.1–1.0)
Monocytes Relative: 9 %
Neutro Abs: 2.8 10*3/uL (ref 1.7–7.7)
Neutrophils Relative %: 54 %
Platelet Count: 252 10*3/uL (ref 150–400)
RBC: 4.48 MIL/uL (ref 3.87–5.11)
RDW: 11.7 % (ref 11.5–15.5)
WBC Count: 5.2 10*3/uL (ref 4.0–10.5)
nRBC: 0 % (ref 0.0–0.2)

## 2020-10-31 LAB — CMP (CANCER CENTER ONLY)
ALT: 17 U/L (ref 0–44)
AST: 17 U/L (ref 15–41)
Albumin: 3.6 g/dL (ref 3.5–5.0)
Alkaline Phosphatase: 61 U/L (ref 38–126)
Anion gap: 9 (ref 5–15)
BUN: 22 mg/dL (ref 8–23)
CO2: 26 mmol/L (ref 22–32)
Calcium: 9.7 mg/dL (ref 8.9–10.3)
Chloride: 105 mmol/L (ref 98–111)
Creatinine: 1.16 mg/dL — ABNORMAL HIGH (ref 0.44–1.00)
GFR, Estimated: 53 mL/min — ABNORMAL LOW (ref >60)
Glucose, Bld: 92 mg/dL (ref 70–99)
Potassium: 4.1 mmol/L (ref 3.5–5.1)
Sodium: 140 mmol/L (ref 135–145)
Total Bilirubin: 0.4 mg/dL (ref 0.3–1.2)
Total Protein: 7 g/dL (ref 6.5–8.1)

## 2020-10-31 MED ORDER — SODIUM CHLORIDE 0.9 % IV SOLN
Freq: Once | INTRAVENOUS | Status: AC
Start: 2020-10-31 — End: 2020-10-31
  Filled 2020-10-31: qty 250

## 2020-10-31 MED ORDER — DIPHENHYDRAMINE HCL 50 MG/ML IJ SOLN
25.0000 mg | Freq: Once | INTRAMUSCULAR | Status: AC
  Administered 2020-10-31: 25 mg via INTRAVENOUS

## 2020-10-31 MED ORDER — HEPARIN SOD (PORK) LOCK FLUSH 100 UNIT/ML IV SOLN
500.0000 [IU] | Freq: Once | INTRAVENOUS | Status: AC | PRN
  Administered 2020-10-31: 500 [IU]
  Filled 2020-10-31: qty 5

## 2020-10-31 MED ORDER — FAMOTIDINE 20 MG IN NS 100 ML IVPB
INTRAVENOUS | Status: AC
  Filled 2020-10-31: qty 100

## 2020-10-31 MED ORDER — SODIUM CHLORIDE 0.9% FLUSH
10.0000 mL | INTRAVENOUS | Status: DC | PRN
  Administered 2020-10-31: 10 mL
  Filled 2020-10-31: qty 10

## 2020-10-31 MED ORDER — SODIUM CHLORIDE 0.9 % IV SOLN
80.0000 mg/m2 | Freq: Once | INTRAVENOUS | Status: AC
  Administered 2020-10-31: 162 mg via INTRAVENOUS
  Filled 2020-10-31: qty 27

## 2020-10-31 MED ORDER — FAMOTIDINE 20 MG IN NS 100 ML IVPB
20.0000 mg | Freq: Once | INTRAVENOUS | Status: AC
  Administered 2020-10-31: 20 mg via INTRAVENOUS

## 2020-10-31 MED ORDER — DEXAMETHASONE SODIUM PHOSPHATE 100 MG/10ML IJ SOLN
10.0000 mg | Freq: Once | INTRAMUSCULAR | Status: AC
  Administered 2020-10-31: 10 mg via INTRAVENOUS
  Filled 2020-10-31: qty 10

## 2020-10-31 MED ORDER — DIPHENHYDRAMINE HCL 50 MG/ML IJ SOLN
INTRAMUSCULAR | Status: AC
  Filled 2020-10-31: qty 1

## 2020-10-31 NOTE — Progress Notes (Signed)
Dr. Alvy Bimler advised to proceed with treatment before labs result.

## 2020-10-31 NOTE — Patient Instructions (Addendum)
[facility]   Discharge Instructions: Thank you for choosing [facility] to provide your oncology and hematology care.   If you have a lab appointment with the [facility], please go directly to the [facility] and check in at the registration area.   Wear comfortable clothing and clothing appropriate for easy access to any Portacath or PICC line.   We strive to give you quality time with your provider. You may need to reschedule your appointment if you arrive late (15 or more minutes).  Arriving late affects you and other patients whose appointments are after yours.  Also, if you miss three or more appointments without notifying the office, you may be dismissed from the clinic at the provider's discretion.      For prescription refill requests, have your pharmacy contact our office and allow 72 hours for refills to be completed.    Today you received the following chemotherapy and/or immunotherapy agents Paclitaxel (Taxol).      To help prevent nausea and vomiting after your treatment, we encourage you to take your nausea medication as directed.  BELOW ARE SYMPTOMS THAT SHOULD BE REPORTED IMMEDIATELY: *FEVER GREATER THAN 100.4 F (38 C) OR HIGHER *CHILLS OR SWEATING *NAUSEA AND VOMITING THAT IS NOT CONTROLLED WITH YOUR NAUSEA MEDICATION *UNUSUAL SHORTNESS OF BREATH *UNUSUAL BRUISING OR BLEEDING *URINARY PROBLEMS (pain or burning when urinating, or frequent urination) *BOWEL PROBLEMS (unusual diarrhea, constipation, pain near the anus) TENDERNESS IN MOUTH AND THROAT WITH OR WITHOUT PRESENCE OF ULCERS (sore throat, sores in mouth, or a toothache) UNUSUAL RASH, SWELLING OR PAIN  UNUSUAL VAGINAL DISCHARGE OR ITCHING   Items with * indicate a potential emergency and should be followed up as soon as possible or go to the Emergency Department if any problems should occur.  Please show the CHEMOTHERAPY ALERT CARD or IMMUNOTHERAPY ALERT CARD at  check-in to the Emergency Department and triage nurse.  Should you have questions after your visit or need to cancel or reschedule your appointment, please contact [facility]  Dept: [phone]  and follow the prompts.  Office hours are 8:00 a.m. to 4:30 p.m. Monday - Friday. Please note that voicemails left after 4:00 p.m. may not be returned until the following business day.  We are closed weekends and major holidays. You have access to a nurse at all times for urgent questions. Please call the main number to the clinic Dept: [phone] and follow the prompts.   For any non-urgent questions, you may also contact your provider using MyChart. We now offer e-Visits for anyone 20 and older to request care online for non-urgent symptoms. For details visit [email or web address].   Also download the MyChart app! Go to the app store, search "MyChart", open the app, select , and log in with your MyChart username and password.  Due to Covid, a mask is required upon entering the hospital/clinic. If you do not have a mask, one will be given to you upon arrival. For doctor visits, patients may have 1 support person aged 68 or older with them. For treatment visits, patients cannot have anyone with them due to current Covid guidelines and our immunocompromised population.   Paclitaxel injection What is this medication? PACLITAXEL (PAK li TAX el) is a chemotherapy drug. It targets fast dividing cells, like cancer cells, and causes these cells to die. This medicine is used to treat ovarian cancer, breast cancer, lung cancer, Kaposi's sarcoma, andother cancers. This medicine  may be used for other purposes; ask your health care provider orpharmacist if you have questions. COMMON BRAND NAME(S): Onxol, Taxol What should I tell my care team before I take this medication? They need to know if you have any of these conditions: history of irregular heartbeat liver  disease low blood counts, like low white cell, platelet, or red cell counts lung or breathing disease, like asthma tingling of the fingers or toes, or other nerve disorder an unusual or allergic reaction to paclitaxel, alcohol, polyoxyethylated castor oil, other chemotherapy, other medicines, foods, dyes, or preservatives pregnant or trying to get pregnant breast-feeding How should I use this medication? This drug is given as an infusion into a vein. It is administered in a hospitalor clinic by a specially trained health care professional. Talk to your pediatrician regarding the use of this medicine in children.Special care may be needed. Overdosage: If you think you have taken too much of this medicine contact apoison control center or emergency room at once. NOTE: This medicine is only for you. Do not share this medicine with others. What if I miss a dose? It is important not to miss your dose. Call your doctor or health careprofessional if you are unable to keep an appointment. What may interact with this medication? Do not take this medicine with any of the following medications: live virus vaccines This medicine may also interact with the following medications: antiviral medicines for hepatitis, HIV or AIDS certain antibiotics like erythromycin and clarithromycin certain medicines for fungal infections like ketoconazole and itraconazole certain medicines for seizures like carbamazepine, phenobarbital, phenytoin gemfibrozil nefazodone rifampin St. [name]'s wort This list may not describe all possible interactions. Give your health care provider a list of all the medicines, herbs, non-prescription drugs, or dietary supplements you use. Also tell them if you smoke, drink alcohol, or use illegaldrugs. Some items may interact with your medicine. What should I watch for while using this medication? Your condition will be monitored carefully while you are receiving this medicine. You will  need important blood work done while you are taking thismedicine. This medicine can cause serious allergic reactions. To reduce your risk you will need to take other medicine(s) before treatment with this medicine. If you experience allergic reactions like skin rash, itching or hives, swelling of theface, lips, or tongue, tell your doctor or health care professional right away. In some cases, you may be given additional medicines to help with side effects.Follow all directions for their use. This drug may make you feel generally unwell. This is not uncommon, as chemotherapy can affect healthy cells as well as cancer cells. Report any side effects. Continue your course of treatment even though you feel ill unless yourdoctor tells you to stop. Call your doctor or health care professional for advice if you get a fever, chills or sore throat, or other symptoms of a cold or flu. Do not treat yourself. This drug decreases your body's ability to fight infections. Try toavoid being around people who are sick. This medicine may increase your risk to bruise or bleed. Call your doctor orhealth care professional if you notice any unusual bleeding. Be careful brushing and flossing your teeth or using a toothpick because you may get an infection or bleed more easily. If you have any dental work done,tell your dentist you are receiving this medicine. Avoid taking products that contain aspirin, acetaminophen, ibuprofen, naproxen, or ketoprofen unless instructed by your doctor. These medicines may hide afever. Do not become pregnant while taking  this medicine. Women should inform their doctor if they wish to become pregnant or think they might be pregnant. There is a potential for serious side effects to an unborn child. Talk to your health care professional or pharmacist for more information. Do not breast-feed aninfant while taking this medicine. Men are advised not to father a child while receiving this medicine. This  product may contain alcohol. Ask your pharmacist or healthcare provider if this medicine contains alcohol. Be sure to tell all healthcare providers you are taking this medicine. Certain medicines, like metronidazole and disulfiram, can cause an unpleasant reaction when taken with alcohol. The reaction includes flushing, headache, nausea, vomiting, sweating, and increased thirst. Thereaction can last from 30 minutes to several hours. What side effects may I notice from receiving this medication? Side effects that you should report to your doctor or health care professionalas soon as possible: allergic reactions like skin rash, itching or hives, swelling of the face, lips, or tongue breathing problems changes in vision fast, irregular heartbeat high or low blood pressure mouth sores pain, tingling, numbness in the hands or feet signs of decreased platelets or bleeding - bruising, pinpoint red spots on the skin, black, tarry stools, blood in the urine signs of decreased red blood cells - unusually weak or tired, feeling faint or lightheaded, falls signs of infection - fever or chills, cough, sore throat, pain or difficulty passing urine signs and symptoms of liver injury like dark yellow or brown urine; general ill feeling or flu-like symptoms; light-colored stools; loss of appetite; nausea; right upper belly pain; unusually weak or tired; yellowing of the eyes or skin swelling of the ankles, feet, hands unusually slow heartbeat Side effects that usually do not require medical attention (report to yourdoctor or health care professional if they continue or are bothersome): diarrhea hair loss loss of appetite muscle or joint pain nausea, vomiting pain, redness, or irritation at site where injected tiredness This list may not describe all possible side effects. Call your doctor for medical advice about side effects. You may report side effects to FDA at1-800-FDA-1088. Where should I keep my  medication? This drug is given in a hospital or clinic and will not be stored at home. NOTE: This sheet is a summary. It may not cover all possible information. If you have questions about this medicine, talk to your doctor, pharmacist, orhealth care provider.  2022 [employer]vier/Gold Standard (2019-03-10 13:37:23)

## 2020-11-01 ENCOUNTER — Telehealth: Payer: Self-pay | Admitting: *Deleted

## 2020-11-01 ENCOUNTER — Ambulatory Visit: Payer: 59

## 2020-11-01 ENCOUNTER — Ambulatory Visit (INDEPENDENT_AMBULATORY_CARE_PROVIDER_SITE_OTHER): Payer: 59 | Admitting: Family Medicine

## 2020-11-01 ENCOUNTER — Other Ambulatory Visit: Payer: Self-pay

## 2020-11-01 ENCOUNTER — Encounter (INDEPENDENT_AMBULATORY_CARE_PROVIDER_SITE_OTHER): Payer: Self-pay | Admitting: Family Medicine

## 2020-11-01 ENCOUNTER — Other Ambulatory Visit (HOSPITAL_COMMUNITY): Payer: Self-pay | Admitting: Hematology and Oncology

## 2020-11-01 ENCOUNTER — Other Ambulatory Visit (HOSPITAL_COMMUNITY): Payer: Self-pay

## 2020-11-01 VITALS — BP 132/72 | HR 66 | Temp 98.3°F | Ht 66.0 in | Wt 185.0 lb

## 2020-11-01 DIAGNOSIS — E669 Obesity, unspecified: Secondary | ICD-10-CM | POA: Diagnosis not present

## 2020-11-01 DIAGNOSIS — E8881 Metabolic syndrome: Secondary | ICD-10-CM

## 2020-11-01 DIAGNOSIS — F418 Other specified anxiety disorders: Secondary | ICD-10-CM | POA: Insufficient documentation

## 2020-11-01 DIAGNOSIS — Z6833 Body mass index (BMI) 33.0-33.9, adult: Secondary | ICD-10-CM | POA: Diagnosis not present

## 2020-11-01 DIAGNOSIS — F3289 Other specified depressive episodes: Secondary | ICD-10-CM

## 2020-11-01 DIAGNOSIS — R4589 Other symptoms and signs involving emotional state: Secondary | ICD-10-CM | POA: Insufficient documentation

## 2020-11-01 MED ORDER — ONDANSETRON HCL 8 MG PO TABS
8.0000 mg | ORAL_TABLET | Freq: Three times a day (TID) | ORAL | 0 refills | Status: DC | PRN
  Filled 2020-11-01: qty 60, 20d supply, fill #0

## 2020-11-01 MED ORDER — ONDANSETRON HCL 8 MG PO TABS
8.0000 mg | ORAL_TABLET | Freq: Three times a day (TID) | ORAL | 0 refills | Status: DC | PRN
  Filled 2020-11-01: qty 20, 7d supply, fill #0

## 2020-11-01 MED ORDER — LORAZEPAM 0.5 MG PO TABS
0.5000 mg | ORAL_TABLET | Freq: Two times a day (BID) | ORAL | 0 refills | Status: DC | PRN
  Filled 2020-11-01: qty 30, 15d supply, fill #0

## 2020-11-01 NOTE — Telephone Encounter (Signed)
Spoke with pt to see how she did with her treatment.  She reports mild nausea this am & took zofran.  She is working & states that she is doing well & denies any other symptoms.  Discussed places to get wigs.  She knows how to reach [name] if needed.

## 2020-11-01 NOTE — Telephone Encounter (Signed)
-----   Message from Evalee Jefferson, RN sent at 10/31/2020 12:46 PM EDT ----- Regarding: Dr. Alvy Bimler - 1st Chemo Follow-up First Chemo follow-up.

## 2020-11-02 ENCOUNTER — Other Ambulatory Visit (HOSPITAL_COMMUNITY): Payer: Self-pay

## 2020-11-02 MED ORDER — BUPROPION HCL ER (SR) 200 MG PO TB12
200.0000 mg | ORAL_TABLET | Freq: Two times a day (BID) | ORAL | 0 refills | Status: DC
  Filled 2020-11-02: qty 60, 30d supply, fill #0

## 2020-11-02 MED ORDER — METFORMIN HCL 500 MG PO TABS
500.0000 mg | ORAL_TABLET | Freq: Two times a day (BID) | ORAL | 0 refills | Status: DC
  Filled 2020-11-02: qty 60, 30d supply, fill #0

## 2020-11-05 DIAGNOSIS — R1084 Generalized abdominal pain: Secondary | ICD-10-CM | POA: Diagnosis not present

## 2020-11-05 DIAGNOSIS — D72819 Decreased white blood cell count, unspecified: Secondary | ICD-10-CM | POA: Diagnosis not present

## 2020-11-06 ENCOUNTER — Other Ambulatory Visit: Payer: Self-pay | Admitting: Pharmacist

## 2020-11-06 ENCOUNTER — Other Ambulatory Visit: Payer: Self-pay

## 2020-11-06 ENCOUNTER — Telehealth: Payer: Self-pay

## 2020-11-06 ENCOUNTER — Inpatient Hospital Stay: Payer: 59

## 2020-11-06 DIAGNOSIS — R110 Nausea: Secondary | ICD-10-CM | POA: Diagnosis not present

## 2020-11-06 DIAGNOSIS — K760 Fatty (change of) liver, not elsewhere classified: Secondary | ICD-10-CM | POA: Diagnosis not present

## 2020-11-06 DIAGNOSIS — Z7189 Other specified counseling: Secondary | ICD-10-CM

## 2020-11-06 DIAGNOSIS — Z5111 Encounter for antineoplastic chemotherapy: Secondary | ICD-10-CM | POA: Diagnosis not present

## 2020-11-06 DIAGNOSIS — C569 Malignant neoplasm of unspecified ovary: Secondary | ICD-10-CM

## 2020-11-06 DIAGNOSIS — N183 Chronic kidney disease, stage 3 unspecified: Secondary | ICD-10-CM | POA: Diagnosis not present

## 2020-11-06 LAB — CMP (CANCER CENTER ONLY)
ALT: 524 U/L (ref 0–44)
AST: 184 U/L (ref 15–41)
Albumin: 3.9 g/dL (ref 3.5–5.0)
Alkaline Phosphatase: 122 U/L (ref 38–126)
Anion gap: 9 (ref 5–15)
BUN: 23 mg/dL (ref 8–23)
CO2: 29 mmol/L (ref 22–32)
Calcium: 9.8 mg/dL (ref 8.9–10.3)
Chloride: 103 mmol/L (ref 98–111)
Creatinine: 1.26 mg/dL — ABNORMAL HIGH (ref 0.44–1.00)
GFR, Estimated: 48 mL/min — ABNORMAL LOW (ref >60)
Glucose, Bld: 91 mg/dL (ref 70–99)
Potassium: 5 mmol/L (ref 3.5–5.1)
Sodium: 141 mmol/L (ref 135–145)
Total Bilirubin: 0.8 mg/dL (ref 0.3–1.2)
Total Protein: 7.8 g/dL (ref 6.5–8.1)

## 2020-11-06 NOTE — Telephone Encounter (Signed)
Called per Dr. Alvy Bimler, given ALT and AST results. Treatment will be canceled for tomorrow. She verbalized understanding. She would like to keep appts as scheduled for tomorrow with Dr. Alvy Bimler and get IV fluids at infusion appt.

## 2020-11-06 NOTE — Progress Notes (Signed)
CRITICAL VALUE STICKER  CRITICAL VALUE: AST 184 and ALT 524  RECEIVER (on-site recipient of call): Harrel Lemon, RN  DATE & TIME NOTIFIED: 11/06/20 AT 1524  MESSENGER (representative from lab): [employer].  MD NOTIFIED: Dr. Alvy Bimler  TIME OF NOTIFICATION: 1530 11/06/20  RESPONSE:  Will see canceled treatment for tomorrow and see Delonda at appt tomorrow.

## 2020-11-07 ENCOUNTER — Inpatient Hospital Stay: Payer: 59

## 2020-11-07 ENCOUNTER — Encounter: Payer: Self-pay | Admitting: Hematology and Oncology

## 2020-11-07 ENCOUNTER — Other Ambulatory Visit: Payer: Self-pay

## 2020-11-07 ENCOUNTER — Inpatient Hospital Stay (HOSPITAL_BASED_OUTPATIENT_CLINIC_OR_DEPARTMENT_OTHER): Payer: 59 | Admitting: Hematology and Oncology

## 2020-11-07 ENCOUNTER — Telehealth: Payer: Self-pay

## 2020-11-07 DIAGNOSIS — C569 Malignant neoplasm of unspecified ovary: Secondary | ICD-10-CM

## 2020-11-07 DIAGNOSIS — R110 Nausea: Secondary | ICD-10-CM

## 2020-11-07 DIAGNOSIS — K760 Fatty (change of) liver, not elsewhere classified: Secondary | ICD-10-CM | POA: Diagnosis not present

## 2020-11-07 DIAGNOSIS — N183 Chronic kidney disease, stage 3 unspecified: Secondary | ICD-10-CM

## 2020-11-07 DIAGNOSIS — R748 Abnormal levels of other serum enzymes: Secondary | ICD-10-CM | POA: Diagnosis not present

## 2020-11-07 DIAGNOSIS — Z5111 Encounter for antineoplastic chemotherapy: Secondary | ICD-10-CM | POA: Diagnosis not present

## 2020-11-07 MED ORDER — SODIUM CHLORIDE 0.9 % IV SOLN
Freq: Once | INTRAVENOUS | Status: AC
  Filled 2020-11-07: qty 250

## 2020-11-07 MED ORDER — HEPARIN SOD (PORK) LOCK FLUSH 100 UNIT/ML IV SOLN
500.0000 [IU] | Freq: Once | INTRAVENOUS | Status: AC
  Administered 2020-11-07: 500 [IU]
  Filled 2020-11-07: qty 5

## 2020-11-07 MED ORDER — PROCHLORPERAZINE EDISYLATE 10 MG/2ML IJ SOLN
10.0000 mg | Freq: Four times a day (QID) | INTRAMUSCULAR | Status: DC | PRN
  Administered 2020-11-07: 10 mg via INTRAVENOUS

## 2020-11-07 MED ORDER — SODIUM CHLORIDE 0.9% FLUSH
10.0000 mL | Freq: Once | INTRAVENOUS | Status: AC
  Administered 2020-11-07: 10 mL
  Filled 2020-11-07: qty 10

## 2020-11-07 MED ORDER — PROCHLORPERAZINE EDISYLATE 10 MG/2ML IJ SOLN
INTRAMUSCULAR | Status: AC
  Filled 2020-11-07: qty 2

## 2020-11-07 NOTE — Patient Instructions (Signed)

## 2020-11-07 NOTE — Telephone Encounter (Signed)
Called and left another message with the below instructions regarding medications changes due to recent labs. Ask her to call the office for questions.

## 2020-11-07 NOTE — Assessment & Plan Note (Signed)
She has profound elevated LFTs secondary to side effects of chemo We will hold treatment today I plan dose reduction for next cycle I have reached out to pharmacy I recommend reducing Wellbutrin to 1 pill/day and to reduce escitalopram to half a pill per day

## 2020-11-07 NOTE — Assessment & Plan Note (Signed)
Unfortunately, cycle 1 of treatment was complicated by severe elevated LFTs, nausea and weakness We will hold treatment today I plan dose reduction with next cycle of therapy I will see her again next week for further follow-up I recommend IV fluids today and medication adjustment

## 2020-11-07 NOTE — Telephone Encounter (Signed)
Called and left a message asking her to call the office back. Dr. Alvy Bimler recommends that she reduce Wellbutrin to once a day and Lexapro reduce to 10 mg daily.

## 2020-11-07 NOTE — Progress Notes (Signed)
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[place]) Unfortunately, cycle 1 of treatment was complicated by severe elevated LFTs, nausea and weakness We will hold treatment today I plan dose reduction with next cycle of therapy I will see her again next week for further follow-up I recommend IV fluids today and medication adjustment   Elevated liver enzymes She has profound elevated LFTs secondary to side effects of chemo We will hold treatment today I plan dose reduction for next cycle I have reached out to pharmacy I recommend reducing Wellbutrin to 1 pill/day and to reduce escitalopram to half a pill per day  CKD (chronic kidney disease), stage III Renal function is stable but she is prone to get dehydrated I recommend IV fluids  Nausea without vomiting We discussed antiemetics as needed Recommend IV fluids  No orders of the defined types were placed in this encounter.   All questions were answered. The patient knows to call the clinic with any problems, questions or concerns. The total time spent in the appointment was 30 minutes encounter with patients including review of chart and various tests results, discussions about plan of care and coordination of care plan   Jacqueline Lark, MD 11/07/2020 1:11 PM  INTERVAL HISTORY: Please see below for problem oriented charting. She is seen prior to chemotherapy She developed significant elevated LFT after first dose of treatment She does not feel well She has mild vague right upper quadrant discomfort and mild nausea and excessive fatigue She had recent nausea and mild constipation She have lost some weight No recent fever or chills  SUMMARY OF ONCOLOGIC HISTORY: Oncology History Overview Note  2018 - Core biopsy for ER/PR and Foundation One testing performed. Unfortunately, no sufficient  tissue for Foundation One testing. ER was 50% and PR 90%. 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     - 09/2000 Chemotherapy   6 cycles of IP cisplatin and etoposide    02/2009 Relapse/Recurrence   CT showed increased size of nodules in pelvis   2010 Surgery   Exlap with section of tumor nodules near cecum and left pelvic sidewall. Tumor: ER negative, PR positive    Treatment Plan Change   Alternated 2 week courses of Megace and Tamoxifen - ended 03/2012   03/2012 PET scan   CT - progressive disease   2013 Treatment Plan Change   Letrozole   01/2016 Imaging   MRI showed progressive disease   2017 Treatment Plan Change   Two weeks of alternating Tamoxifen 96m daily and then Megace 473mTID   08/2016 Imaging   MRI showed progressive disease with peritoneal implants near liver, in pelvis   01/2017 Treatment Plan Change   Lupron 11.25 q 3 months   11/2017 Imaging   Overall mixed response.   Mixed cystic/solid lesions in the left pelvis  are mildly improved.   Cystic peritoneal disease, including the dominant lesion along the posterior right hepatic lobe, is mildly progressed.   Subcapsular lesion along the posterior right hepatic lobe is unchanged.   03/2018 Imaging   MRI A/P: Mixed response of individual peritoneal metastases in the pelvis, as  described above. Overall, there has been no significant change in bulk of disease.   Stable cystic peritoneal metastatic disease along the capsular surfaces of the liver and spleen.   No new sites of metastatic disease identified within the abdomen or pelvis.   08/2018 Imaging   MRI A/P: Status post hysterectomy and bilateral salpingo-oophorectomy.   Mixed cystic/solid peritoneal implants in the abdomen/pelvis, as above. Dominant cystic implant along the posterior liver surface is mildly increased. Remaining lesions are overall grossly unchanged.   No new lesions are identified.   01/28/2019 Imaging   Mri A/P: 1. Relatively similar appearance of peritoneal metastasis. A posterior right hepatic capsular based lesion is similar to minimally decreased in size. Left pelvic implants are primarily similar with possible enlargement of an anterior high left pelvic cystic implant. No new disease identified. 2.  Aortic Atherosclerosis (ICD10-I70.0). 3. Hepatic steatosis. 4. Left adrenal adenoma.   06/02/2019 Imaging   MRI 1. Potential slight enlargement of dominant cystic area and solid component, associated with rind like signal variation on T2 along the inferior right hepatic margin, also potentially slightly increased. Findings may still be within the realm of measurement and technical variation. Close attention on follow-up. 2. Subtle cystic changes along the cephalad margin of the spleen are difficult to see on previous imaging, perhaps new compared with prior imaging studies. 3. Pelvic implants and left lower quadrant lesion with similar size, of the area along the left iliac vasculature may be slightly larger than on the prior study. 4. Signs of extensive retroperitoneal and pelvic lymphadenectomy. 5. Hepatic steatosis. 6. 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     06/08/2019 Cancer Staging   Staging form: [place], AJCC 7th Edition -  Clinical: Stage IIIC (rT2, N1, M0) - Signed by Jacqueline Lark, MD on 06/08/2019    06/10/2019 Imaging   1. 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 These findings are slightly worsened in comparison to a remote prior CT examination dated 10/04/2014.   2.  No evidence of metastatic disease in the chest.   3. Status post hysterectomy, pelvic and retroperitoneal lymph node dissection, and ventral hernia mesh repair.   4.  Hepatic steatosis.   5.  Aortic Atherosclerosis (ICD10-I70.0).   06/24/2019 - 09/02/2019 Chemotherapy   The patient had carboplatin for chemotherapy treatment.     09/02/2019 Tumor Marker   Patient's tumor was tested for the following markers: Inhibin B Results of the tumor marker test revealed 94   09/23/2019 Imaging   1. Interval progression of the soft tissue lesions in the anterior left pelvis, likely peritoneal implants, compatible with disease progression. Remaining sites of apparent disease along the liver capsule and posterior spleen are stable 2. Stable 2 cm left adrenal adenoma. 3. Hepatic steatosis. 4. Right-side predominant colonic diverticulosis without diverticulitis. 5. Aortic Atherosclerosis (ICD10-I70.0).   12/23/2019 Imaging   1. Slight interval increase in size of a mixed solid and cystic nodule in the left hemipelvis measuring 3.0 x 2.9 cm, previously  2.7 x 2.1 cm. 2. Interval decrease in size of a nodule in the left paracolic gutter measuring 1.0 x 0.8 cm, previously 2.1 x 1.6 cm. 3. Stable peritoneal nodules in the hepatorenal recess and at the inferior tip of the spleen. 4. Unchanged nodule or lymph node overlying the left external iliac artery. 5. Enlargement of dominant left pelvic nodule is concerning for disease progression despite interval decrease in size of a nodule in  the left paracolic gutter and stability of other nodules. 6. No evidence of metastatic disease in the chest. 7. Stable, benign left adrenal adenoma. 8. Ventral hernia mesh repair with a small component of recurrent hernia inferiorly, containing a single nonobstructed loop of small bowel. 9. Hepatic steatosis. 10. Aortic Atherosclerosis (ICD10-I70.0).   12/23/2019 Tumor Marker   Patient's tumor was tested for the following markers: Inhibin B Results of the tumor marker test revealed 94.9   01/25/2020 Tumor Marker   Patient's tumor was tested for the following markers: Inhibin B Results of the tumor marker test revealed 116.7   02/20/2020 Genetic Testing   Negative genetic testing: no pathogenic variants detected in [facility] Multi-Cancer Panel.  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  The report date is February 20, 2020.   The Multi-Cancer Panel offered by [facility] includes sequencing and/or deletion duplication testing of the following 85 genes: AIP, ALK, APC, ATM, AXIN2,BAP1,  BARD1, BLM, BMPR1A, BRCA1, BRCA2, BRIP1, CASR, CDC73, CDH1, CDK4, CDKN1B, CDKN1C, CDKN2A (p14ARF), CDKN2A (p16INK4a), CEBPA, CHEK2, CTNNA1, DICER1, DIS3L2, EGFR (c.2369C>T, p.Thr790Met variant only), EPCAM (Deletion/duplication testing only), FH, FLCN, GATA2, GPC3, GREM1 (Promoter region deletion/duplication testing only), HOXB13 (c.251G>A, p.Gly84Glu), HRAS, KIT, MAX, MEN1, MET, MITF (c.952G>A, p.Glu318Lys variant only), MLH1, MSH2, MSH3, MSH6, MUTYH, NBN, NF1, NF2, NTHL1, PALB2, PDGFRA, PHOX2B, PMS2, POLD1, POLE, POT1, PRKAR1A, PTCH1, PTEN, RAD50, RAD51C, RAD51D, RB1, RECQL4, RET, RNF43, RUNX1, SDHAF2, SDHA (sequence changes only), SDHB, SDHC, SDHD, SMAD4, SMARCA4, SMARCB1, SMARCE1, STK11, SUFU, TERC, TERT, TMEM127, TP53, TSC1, TSC2, VHL, WRN and WT1.    03/02/2020 Tumor Marker   Patient's tumor was tested for the following markers: Inhibin B Results of the tumor marker test revealed 80.8    04/17/2020 Imaging   1. Overall, exam is stable. Multiple peritoneal nodules are again seen. The index nodule in the left lower quadrant is mildly increased in size in the interval. The lesion within the anterior left pelvis has decreased in size in the interval. There has also been decrease in size of cystic lesion within the a hepatorenal recess. The remaining peritoneal lesions are unchanged. No new lesions identified. 2. Stable left adrenal nodule. 3.  Aortic Atherosclerosis (ICD10-I70.0).     08/21/2020 Imaging   Bone density is normal AP spine T score 0.8 Femoral neck on the left, T score -0.2 Femoral neck on the right ,T score -0.4   10/17/2020 Imaging   1. Interval progression of peritoneal nodules along the left pelvic sidewall, concerning for progressive metastatic disease. 2. Small capsular lesion medial right liver and the apparent cystic lesion superior to the right kidney are similar to prior. 3. Tiny soft tissue nodule anterior aspect of the lateral left pelvis described as decreasing on the prior study has decreased further on today's exam. 4. Stable left adrenal nodule. This cannot be definitively characterized. 5. Tiny nonobstructing stone lower pole right kidney. 6. Aortic Atherosclerosis (ICD10-I70.0).   10/26/2020 Procedure   Successful placement of a right internal jugular approach power injectable Port-A-Cath. The catheter is ready for immediate use.  10/31/2020 -  Chemotherapy    Patient is on Treatment Plan: OVARIAN PACLITAXEL O8,4,16,60 Q28D         REVIEW OF SYSTEMS:   Constitutional: Denies fevers, chills  Eyes: Denies blurriness of vision Ears, nose, mouth, throat, and face: Denies mucositis or sore throat Respiratory: Denies cough, dyspnea or wheezes Cardiovascular: Denies palpitation, chest discomfort or lower extremity swelling Skin: Denies abnormal skin rashes Lymphatics: Denies new lymphadenopathy or easy bruising Behavioral/Psych: Mood is  stable, no new changes  All other systems were reviewed with the patient and are negative.  I have reviewed the past medical history, past surgical history, social history and family history with the patient and they are unchanged from previous note.  ALLERGIES:  is allergic to codeine, hydrocodone-acetaminophen, oxycodone, sodium thiosalicylate, thimerosal, tramadol hcl, ciprofloxacin, daypro [oxaprozin], levofloxacin, stadol [butorphanol tartrate], and sulfa antibiotics.  MEDICATIONS:  Current Outpatient Medications  Medication Sig Dispense Refill   acetaminophen (TYLENOL) 500 MG tablet Take 1,000 mg by mouth every 6 (six) hours as needed for moderate pain.     Biotin 5 MG TABS Take 5 mg by mouth daily.     buPROPion (WELLBUTRIN SR) 200 MG 12 hr tablet Take 1 tablet (200 mg total) by mouth 2 (two) times daily. 60 tablet 0   Ca Phosphate-Cholecalciferol (EQL CALCIUM GUMMIES) 250-400 MG-UNIT CHEW Chew 1 tablet by mouth 2 (two) times daily.     escitalopram (LEXAPRO) 20 MG tablet TAKE 1 TABLET BY MOUTH ONCE A DAY (Patient taking differently: Take 20 mg by mouth daily.) 90 tablet 3   lidocaine-prilocaine (EMLA) cream Apply to affected area once (Patient taking differently: Apply 1 application topically daily as needed (port access).) 30 g 3   LORazepam (ATIVAN) 0.5 MG tablet Take 1 tablet (0.5 mg total) by mouth 2 (two) times daily as needed for anxiety. 30 tablet 0   metFORMIN (GLUCOPHAGE) 500 MG tablet Take 1 tablet (500 mg total) by mouth 2 (two) times daily with a meal. 60 tablet 0   mirabegron ER (MYRBETRIQ) 50 MG TB24 tablet Take 50 mg by mouth daily.     omeprazole (PRILOSEC) 20 MG capsule Take 1 capsule by mouth once daily (Patient taking differently: Take 20 mg by mouth daily.) 90 capsule 3   ondansetron (ZOFRAN) 8 MG tablet Take 1 tablet (8 mg total) by mouth every 8 (eight) hours as needed for nausea or vomiting. 60 tablet 0   Polyethyl Glycol-Propyl Glycol (SYSTANE OP) Place 1 drop  into both eyes daily as needed (dry eyes).     Probiotic Product (ALIGN PO) Take 1 tablet by mouth daily.      prochlorperazine (COMPAZINE) 10 MG tablet Take 10 mg by mouth every 6 (six) hours as needed for nausea or vomiting.     No current facility-administered medications for this visit.   Facility-Administered Medications Ordered in Other Visits  Medication Dose Route Frequency Provider Last Rate Last Admin   prochlorperazine (COMPAZINE) injection 10 mg  10 mg Intravenous Q6H PRN Alvy Bimler, Ni, MD   10 mg at 11/07/20 0850    PHYSICAL EXAMINATION: ECOG PERFORMANCE STATUS: 1 - Symptomatic but completely ambulatory  Vitals:   11/07/20 0817  BP: 123/64  Pulse: 64  Resp: 18  Temp: (!) 97.4 F (36.3 C)  SpO2: 98%   Filed Weights   11/07/20 0817  Weight: 185 lb 4.8 oz (84.1 kg)    GENERAL:alert, no distress and comfortable SKIN: skin color, texture, turgor are normal, no rashes or significant lesions  EYES: normal, Conjunctiva are pink and non-injected, sclera clear OROPHARYNX:no exudate, no erythema and lips, buccal mucosa, and tongue normal  NECK: supple, thyroid normal size, non-tender, without nodularity LYMPH:  no palpable lymphadenopathy in the cervical, axillary or inguinal LUNGS: clear to auscultation and percussion with normal breathing effort HEART: regular rate & rhythm and no murmurs and no lower extremity edema ABDOMEN:abdomen soft, mild tenderness in the right upper quadrant without rebound or guarding Musculoskeletal:no cyanosis of digits and no clubbing  NEURO: alert & oriented x 3 with fluent speech, no focal motor/sensory deficits  LABORATORY DATA:  I have reviewed the data as listed    Component Value Date/Time   NA 141 11/06/2020 1440   NA 139 11/30/2019 0913   NA 141 09/24/2013 1131   K 5.0 11/06/2020 1440   K 4.5 09/24/2013 1131   CL 103 11/06/2020 1440   CO2 29 11/06/2020 1440   CO2 24 09/24/2013 1131   GLUCOSE 91 11/06/2020 1440   GLUCOSE 90  09/24/2013 1131   BUN 23 11/06/2020 1440   BUN 32 (H) 11/30/2019 0913   BUN 21.2 09/24/2013 1131   CREATININE 1.26 (H) 11/06/2020 1440   CREATININE 1.1 09/24/2013 1131   CALCIUM 9.8 11/06/2020 1440   CALCIUM 9.5 09/24/2013 1131   PROT 7.8 11/06/2020 1440   PROT 7.0 11/30/2019 0913   PROT 6.8 09/24/2013 1131   ALBUMIN 3.9 11/06/2020 1440   ALBUMIN 4.3 11/30/2019 0913   ALBUMIN 3.9 09/24/2013 1131   AST 184 (HH) 11/06/2020 1440   AST 23 09/24/2013 1131   ALT 524 (HH) 11/06/2020 1440   ALT 27 09/24/2013 1131   ALKPHOS 122 11/06/2020 1440   ALKPHOS 81 09/24/2013 1131   BILITOT 0.8 11/06/2020 1440   BILITOT 0.53 09/24/2013 1131   GFRNONAA 48 (L) 11/06/2020 1440   GFRAA 55 (L) 01/24/2020 1530   GFRAA 57 (L) 12/21/2019 1259    No results found for: SPEP, UPEP  Lab Results  Component Value Date   WBC 5.2 10/31/2020   NEUTROABS 2.8 10/31/2020   HGB 14.2 10/31/2020   HCT 41.2 10/31/2020   MCV 92.0 10/31/2020   PLT 252 10/31/2020      Chemistry      Component Value Date/Time   NA 141 11/06/2020 1440   NA 139 11/30/2019 0913   NA 141 09/24/2013 1131   K 5.0 11/06/2020 1440   K 4.5 09/24/2013 1131   CL 103 11/06/2020 1440   CO2 29 11/06/2020 1440   CO2 24 09/24/2013 1131   BUN 23 11/06/2020 1440   BUN 32 (H) 11/30/2019 0913   BUN 21.2 09/24/2013 1131   CREATININE 1.26 (H) 11/06/2020 1440   CREATININE 1.1 09/24/2013 1131      Component Value Date/Time   CALCIUM 9.8 11/06/2020 1440   CALCIUM 9.5 09/24/2013 1131   ALKPHOS 122 11/06/2020 1440   ALKPHOS 81 09/24/2013 1131   AST 184 (HH) 11/06/2020 1440   AST 23 09/24/2013 1131   ALT 524 (HH) 11/06/2020 1440   ALT 27 09/24/2013 1131   BILITOT 0.8 11/06/2020 1440   BILITOT 0.53 09/24/2013 1131

## 2020-11-07 NOTE — Assessment & Plan Note (Signed)
Renal function is stable but she is prone to get dehydrated I recommend IV fluids

## 2020-11-07 NOTE — Assessment & Plan Note (Signed)
We discussed antiemetics as needed Recommend IV fluids

## 2020-11-08 ENCOUNTER — Encounter (INDEPENDENT_AMBULATORY_CARE_PROVIDER_SITE_OTHER): Payer: Self-pay | Admitting: Family Medicine

## 2020-11-08 NOTE — Telephone Encounter (Signed)
Please review

## 2020-11-13 ENCOUNTER — Inpatient Hospital Stay: Payer: 59

## 2020-11-13 ENCOUNTER — Other Ambulatory Visit: Payer: Self-pay

## 2020-11-13 DIAGNOSIS — Z7189 Other specified counseling: Secondary | ICD-10-CM

## 2020-11-13 DIAGNOSIS — R110 Nausea: Secondary | ICD-10-CM | POA: Diagnosis not present

## 2020-11-13 DIAGNOSIS — C569 Malignant neoplasm of unspecified ovary: Secondary | ICD-10-CM | POA: Diagnosis not present

## 2020-11-13 DIAGNOSIS — N183 Chronic kidney disease, stage 3 unspecified: Secondary | ICD-10-CM | POA: Diagnosis not present

## 2020-11-13 DIAGNOSIS — Z5111 Encounter for antineoplastic chemotherapy: Secondary | ICD-10-CM | POA: Diagnosis not present

## 2020-11-13 DIAGNOSIS — K760 Fatty (change of) liver, not elsewhere classified: Secondary | ICD-10-CM | POA: Diagnosis not present

## 2020-11-13 LAB — CBC WITH DIFFERENTIAL (CANCER CENTER ONLY)
Abs Immature Granulocytes: 0 10*3/uL (ref 0.00–0.07)
Basophils Absolute: 0.1 10*3/uL (ref 0.0–0.1)
Basophils Relative: 1 %
Eosinophils Absolute: 0.2 10*3/uL (ref 0.0–0.5)
Eosinophils Relative: 3 %
HCT: 40 % (ref 36.0–46.0)
Hemoglobin: 13.8 g/dL (ref 12.0–15.0)
Immature Granulocytes: 0 %
Lymphocytes Relative: 49 %
Lymphs Abs: 2.4 10*3/uL (ref 0.7–4.0)
MCH: 31.5 pg (ref 26.0–34.0)
MCHC: 34.5 g/dL (ref 30.0–36.0)
MCV: 91.3 fL (ref 80.0–100.0)
Monocytes Absolute: 0.4 10*3/uL (ref 0.1–1.0)
Monocytes Relative: 9 %
Neutro Abs: 1.9 10*3/uL (ref 1.7–7.7)
Neutrophils Relative %: 38 %
Platelet Count: 271 10*3/uL (ref 150–400)
RBC: 4.38 MIL/uL (ref 3.87–5.11)
RDW: 11.9 % (ref 11.5–15.5)
WBC Count: 5 10*3/uL (ref 4.0–10.5)
nRBC: 0 % (ref 0.0–0.2)

## 2020-11-13 LAB — CMP (CANCER CENTER ONLY)
ALT: 55 U/L — ABNORMAL HIGH (ref 0–44)
AST: 19 U/L (ref 15–41)
Albumin: 3.8 g/dL (ref 3.5–5.0)
Alkaline Phosphatase: 76 U/L (ref 38–126)
Anion gap: 13 (ref 5–15)
BUN: 23 mg/dL (ref 8–23)
CO2: 20 mmol/L — ABNORMAL LOW (ref 22–32)
Calcium: 9.7 mg/dL (ref 8.9–10.3)
Chloride: 106 mmol/L (ref 98–111)
Creatinine: 1.18 mg/dL — ABNORMAL HIGH (ref 0.44–1.00)
GFR, Estimated: 52 mL/min — ABNORMAL LOW (ref >60)
Glucose, Bld: 107 mg/dL — ABNORMAL HIGH (ref 70–99)
Potassium: 4.3 mmol/L (ref 3.5–5.1)
Sodium: 139 mmol/L (ref 135–145)
Total Bilirubin: 0.5 mg/dL (ref 0.3–1.2)
Total Protein: 7 g/dL (ref 6.5–8.1)

## 2020-11-14 ENCOUNTER — Other Ambulatory Visit: Payer: 59

## 2020-11-14 ENCOUNTER — Inpatient Hospital Stay: Payer: 59

## 2020-11-14 ENCOUNTER — Inpatient Hospital Stay (HOSPITAL_BASED_OUTPATIENT_CLINIC_OR_DEPARTMENT_OTHER): Payer: 59 | Admitting: Hematology and Oncology

## 2020-11-14 ENCOUNTER — Encounter: Payer: Self-pay | Admitting: Hematology and Oncology

## 2020-11-14 DIAGNOSIS — C569 Malignant neoplasm of unspecified ovary: Secondary | ICD-10-CM

## 2020-11-14 DIAGNOSIS — R748 Abnormal levels of other serum enzymes: Secondary | ICD-10-CM

## 2020-11-14 DIAGNOSIS — Z5111 Encounter for antineoplastic chemotherapy: Secondary | ICD-10-CM | POA: Diagnosis not present

## 2020-11-14 DIAGNOSIS — K760 Fatty (change of) liver, not elsewhere classified: Secondary | ICD-10-CM | POA: Diagnosis not present

## 2020-11-14 DIAGNOSIS — Z7189 Other specified counseling: Secondary | ICD-10-CM

## 2020-11-14 DIAGNOSIS — N183 Chronic kidney disease, stage 3 unspecified: Secondary | ICD-10-CM | POA: Diagnosis not present

## 2020-11-14 DIAGNOSIS — R110 Nausea: Secondary | ICD-10-CM | POA: Diagnosis not present

## 2020-11-14 MED ORDER — DIPHENHYDRAMINE HCL 50 MG/ML IJ SOLN
INTRAMUSCULAR | Status: AC
  Filled 2020-11-14: qty 1

## 2020-11-14 MED ORDER — SODIUM CHLORIDE 0.9 % IV SOLN
Freq: Once | INTRAVENOUS | Status: AC
  Filled 2020-11-14: qty 250

## 2020-11-14 MED ORDER — SODIUM CHLORIDE 0.9 % IV SOLN
60.0000 mg/m2 | Freq: Once | INTRAVENOUS | Status: AC
  Administered 2020-11-14: 120 mg via INTRAVENOUS
  Filled 2020-11-14: qty 20

## 2020-11-14 MED ORDER — FAMOTIDINE 20 MG IN NS 100 ML IVPB
20.0000 mg | Freq: Once | INTRAVENOUS | Status: AC
  Administered 2020-11-14: 20 mg via INTRAVENOUS

## 2020-11-14 MED ORDER — DIPHENHYDRAMINE HCL 50 MG/ML IJ SOLN
25.0000 mg | Freq: Once | INTRAMUSCULAR | Status: AC
  Administered 2020-11-14: 25 mg via INTRAVENOUS

## 2020-11-14 MED ORDER — SODIUM CHLORIDE 0.9% FLUSH
10.0000 mL | INTRAVENOUS | Status: DC | PRN
  Administered 2020-11-14: 10 mL
  Filled 2020-11-14: qty 10

## 2020-11-14 MED ORDER — HEPARIN SOD (PORK) LOCK FLUSH 100 UNIT/ML IV SOLN
500.0000 [IU] | Freq: Once | INTRAVENOUS | Status: AC | PRN
  Administered 2020-11-14: 500 [IU]
  Filled 2020-11-14: qty 5

## 2020-11-14 MED ORDER — SODIUM CHLORIDE 0.9 % IV SOLN
10.0000 mg | Freq: Once | INTRAVENOUS | Status: AC
  Administered 2020-11-14: 10 mg via INTRAVENOUS
  Filled 2020-11-14: qty 1
  Filled 2020-11-14: qty 10

## 2020-11-14 MED ORDER — FAMOTIDINE 20 MG IN NS 100 ML IVPB
INTRAVENOUS | Status: AC
  Filled 2020-11-14: qty 100

## 2020-11-14 NOTE — Progress Notes (Signed)
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[facility]) She is fully recovered from side effects of treatment Her LFTs are almost completely normal Per previous discussion, I plan 25% dose reduction of Taxol After today's dose, she will get a week break In 2 weeks, she will return for treatment #3 and then a week later treatment #4  Elevated liver enzymes Her recent elevated liver enzymes are caused by Taxol Her liver enzymes are almost back to normal Per previous discussion, I have reduced the dose of her treatment We will monitor carefully She will continue reduced dose of Wellbutrin for now  CKD (chronic kidney disease), stage III Renal function is stable but she is prone to get dehydrated We discussed importance of oral fluid intake  No orders of the defined types were placed in this encounter.   All questions were answered. The patient knows to call the clinic with any problems, questions or concerns. The total time spent in the appointment was 20 minutes encounter with patients including review of chart and various tests results, discussions about plan of care and coordination of care plan   Heath Lark, MD 11/14/2020 3:30 PM  INTERVAL HISTORY: Please see below for problem oriented charting. She returns for further follow-up She still have mild right upper quadrant discomfort but not severe No recent nausea or vomiting  SUMMARY OF ONCOLOGIC HISTORY: Oncology History Overview Note  2018 - Core biopsy for ER/PR and Foundation One testing performed. Unfortunately, no sufficient tissue for Foundation One testing. ER was 50% and PR 90%. Progressed on carboplatin, exemestane, Avastin,Tamoxifen/Megace and letrozole, mixed response on Lupron  [facility]: 06/23/19: 108 05/26/19: 9.04 01/05/19: 6.84 09/02/18: 3.72 06/01/18: 3.84 02/24/18: 2.6 11/21/17:  3.26 08/26/17: 1.77 05/27/17: 2.12 01/28/17: 2.47 06/10/16: 2.68 02/06/16: 1.84 05/12/15: 3.27 10/03/14: 2.12  Inhibin B 09/22/2019: 95.9 05/26/19: 90.2 01/05/19: 92 09/02/18: 70.9 06/04/18: 70 02/24/18: 79.5 11/21/17: 85.8 08/26/17: 65.6 05/27/17: 58.9 01/28/17: 198 06/10/16: 118.1 02/06/16: 62.4 05/12/15: 64.6 10/03/14: 58   Malignant granulosa cell tumor of ovary ([place])  1994 Initial Diagnosis   1994   2002 Relapse/Recurrence   Upper abdominal recurrence, resected.     - 09/2000 Chemotherapy   6 cycles of IP cisplatin and etoposide    02/2009 Relapse/Recurrence   CT showed increased size of nodules in pelvis   2010 Surgery   Exlap with section of tumor nodules near cecum and left pelvic sidewall. Tumor: ER negative, PR positive    Treatment Plan Change   Alternated 2 week courses of Megace and Tamoxifen - ended 03/2012   03/2012 PET scan   CT - progressive disease   2013 Treatment Plan Change   Letrozole   01/2016 Imaging   MRI showed progressive disease   2017 Treatment Plan Change   Two weeks of alternating Tamoxifen 37m daily and then Megace 418mTID   08/2016 Imaging   MRI showed progressive disease with peritoneal implants near liver, in pelvis   01/2017 Treatment Plan Change   Lupron 11.25 q 3 months   11/2017 Imaging   Overall mixed response.   Mixed cystic/solid lesions in the left pelvis are mildly improved.   Cystic peritoneal disease, including the dominant lesion along the posterior right hepatic lobe, is mildly progressed.   Subcapsular lesion along the posterior right hepatic lobe is unchanged.   03/2018 Imaging   MRI A/P: Mixed  response of individual peritoneal metastases in the pelvis, as described above. Overall, there has been no significant change in bulk of disease.   Stable cystic peritoneal metastatic disease along the capsular surfaces of the liver and spleen.   No new sites of metastatic disease identified within the abdomen or pelvis.    08/2018 Imaging   MRI A/P: Status post hysterectomy and bilateral salpingo-oophorectomy.   Mixed cystic/solid peritoneal implants in the abdomen/pelvis, as above. Dominant cystic implant along the posterior liver surface is mildly increased. Remaining lesions are overall grossly unchanged.   No new lesions are identified.   01/28/2019 Imaging   Mri A/P: 1. Relatively similar appearance of peritoneal metastasis. A posterior right hepatic capsular based lesion is similar to minimally decreased in size. Left pelvic implants are primarily similar with possible enlargement of an anterior high left pelvic cystic implant. No new disease identified. 2.  Aortic Atherosclerosis (ICD10-I70.0). 3. Hepatic steatosis. 4. Left adrenal adenoma.   06/02/2019 Imaging   MRI 1. Potential slight enlargement of dominant cystic area and solid component, associated with rind like signal variation on T2 along the inferior right hepatic margin, also potentially slightly increased. Findings may still be within the realm of measurement and technical variation. Close attention on follow-up. 2. Subtle cystic changes along the cephalad margin of the spleen are difficult to see on previous imaging, perhaps new compared with prior imaging studies. 3. Pelvic implants and left lower quadrant lesion with similar size, of the area along the left iliac vasculature may be slightly larger than on the prior study. 4. Signs of extensive retroperitoneal and pelvic lymphadenectomy. 5. Hepatic steatosis. 6. 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     06/08/2019 Cancer Staging   Staging form: [place], AJCC 7th Edition - Clinical: Stage IIIC (rT2, N1, M0) - Signed by Heath Lark, MD on 06/08/2019    06/10/2019 Imaging   1. Multiple redemonstrated partially solid metastatic implants in the hepatorenal recess, left paracolic gutter, left pelvis, and likely the tip of the spleen as detailed  above and as seen on recent prior MRI dated 06/02/2019. These findings are slightly worsened in comparison to a remote prior CT examination dated 10/04/2014.   2.  No evidence of metastatic disease in the chest.   3. Status post hysterectomy, pelvic and retroperitoneal lymph node dissection, and ventral hernia mesh repair.   4.  Hepatic steatosis.   5.  Aortic Atherosclerosis (ICD10-I70.0).   06/24/2019 - 09/02/2019 Chemotherapy   The patient had carboplatin for chemotherapy treatment.     09/02/2019 Tumor Marker   Patient's tumor was tested for the following markers: Inhibin B Results of the tumor marker test revealed 94   09/23/2019 Imaging   1. Interval progression of the soft tissue lesions in the anterior left pelvis, likely peritoneal implants, compatible with disease progression. Remaining sites of apparent disease along the liver capsule and posterior spleen are stable 2. Stable 2 cm left adrenal adenoma. 3. Hepatic steatosis. 4. Right-side predominant colonic diverticulosis without diverticulitis. 5. Aortic Atherosclerosis (ICD10-I70.0).   12/23/2019 Imaging   1. Slight interval increase in size of a mixed solid and cystic nodule in the left hemipelvis measuring 3.0 x 2.9 cm, previously 2.7 x 2.1 cm. 2. Interval decrease in size of a nodule in the left paracolic gutter measuring 1.0 x 0.8 cm, previously 2.1 x 1.6 cm. 3. Stable peritoneal nodules in the hepatorenal recess and at the inferior tip of the  spleen. 4. Unchanged nodule or lymph node overlying the left external iliac artery. 5. Enlargement of dominant left pelvic nodule is concerning for disease progression despite interval decrease in size of a nodule in the left paracolic gutter and stability of other nodules. 6. No evidence of metastatic disease in the chest. 7. Stable, benign left adrenal adenoma. 8. Ventral hernia mesh repair with a small component of recurrent hernia inferiorly, containing a single nonobstructed loop  of small bowel. 9. Hepatic steatosis. 10. Aortic Atherosclerosis (ICD10-I70.0).   12/23/2019 Tumor Marker   Patient's tumor was tested for the following markers: Inhibin B Results of the tumor marker test revealed 94.9   01/25/2020 Tumor Marker   Patient's tumor was tested for the following markers: Inhibin B Results of the tumor marker test revealed 116.7   02/20/2020 Genetic Testing   Negative genetic testing: no pathogenic variants detected in [facility] Multi-Cancer Panel.  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  The report date is February 20, 2020.   The Multi-Cancer Panel offered by [facility] includes sequencing and/or deletion duplication testing of the following 85 genes: AIP, ALK, APC, ATM, AXIN2,BAP1,  BARD1, BLM, BMPR1A, BRCA1, BRCA2, BRIP1, CASR, CDC73, CDH1, CDK4, CDKN1B, CDKN1C, CDKN2A (p14ARF), CDKN2A (p16INK4a), CEBPA, CHEK2, CTNNA1, DICER1, DIS3L2, EGFR (c.2369C>T, p.Thr790Met variant only), EPCAM (Deletion/duplication testing only), FH, FLCN, GATA2, GPC3, GREM1 (Promoter region deletion/duplication testing only), HOXB13 (c.251G>A, p.Gly84Glu), HRAS, KIT, MAX, MEN1, MET, MITF (c.952G>A, p.Glu318Lys variant only), MLH1, MSH2, MSH3, MSH6, MUTYH, NBN, NF1, NF2, NTHL1, PALB2, PDGFRA, PHOX2B, PMS2, POLD1, POLE, POT1, PRKAR1A, PTCH1, PTEN, RAD50, RAD51C, RAD51D, RB1, RECQL4, RET, RNF43, RUNX1, SDHAF2, SDHA (sequence changes only), SDHB, SDHC, SDHD, SMAD4, SMARCA4, SMARCB1, SMARCE1, STK11, SUFU, TERC, TERT, TMEM127, TP53, TSC1, TSC2, VHL, WRN and WT1.    03/02/2020 Tumor Marker   Patient's tumor was tested for the following markers: Inhibin B Results of the tumor marker test revealed 80.8   04/17/2020 Imaging   1. Overall, exam is stable. Multiple peritoneal nodules are again seen. The index nodule in the left lower quadrant is mildly increased in size in the interval. The lesion within the anterior left pelvis has decreased in size in the interval. There has  also been decrease in size of cystic lesion within the a hepatorenal recess. The remaining peritoneal lesions are unchanged. No new lesions identified. 2. Stable left adrenal nodule. 3.  Aortic Atherosclerosis (ICD10-I70.0).     08/21/2020 Imaging   Bone density is normal AP spine T score 0.8 Femoral neck on the left, T score -0.2 Femoral neck on the right ,T score -0.4   10/17/2020 Imaging   1. Interval progression of peritoneal nodules along the left pelvic sidewall, concerning for progressive metastatic disease. 2. Small capsular lesion medial right liver and the apparent cystic lesion superior to the right kidney are similar to prior. 3. Tiny soft tissue nodule anterior aspect of the lateral left pelvis described as decreasing on the prior study has decreased further on today's exam. 4. Stable left adrenal nodule. This cannot be definitively characterized. 5. Tiny nonobstructing stone lower pole right kidney. 6. Aortic Atherosclerosis (ICD10-I70.0).   10/26/2020 Procedure   Successful placement of a right internal jugular approach power injectable Port-A-Cath. The catheter is ready for immediate use.       10/31/2020 -  Chemotherapy    Patient is on Treatment Plan: OVARIAN PACLITAXEL Z6,1,09,60 Q28D         REVIEW OF SYSTEMS:   Constitutional: Denies fevers, chills or abnormal weight  loss Eyes: Denies blurriness of vision Ears, nose, mouth, throat, and face: Denies mucositis or sore throat Respiratory: Denies cough, dyspnea or wheezes Cardiovascular: Denies palpitation, chest discomfort or lower extremity swelling Gastrointestinal:  Denies nausea, heartburn or change in bowel habits Skin: Denies abnormal skin rashes Lymphatics: Denies new lymphadenopathy or easy bruising Neurological:Denies numbness, tingling or new weaknesses Behavioral/Psych: Mood is stable, no new changes  All other systems were reviewed with the patient and are negative.  I have reviewed the past  medical history, past surgical history, social history and family history with the patient and they are unchanged from previous note.  ALLERGIES:  is allergic to codeine, hydrocodone-acetaminophen, oxycodone, sodium thiosalicylate, thimerosal, tramadol hcl, ciprofloxacin, daypro [oxaprozin], levofloxacin, stadol [butorphanol tartrate], and sulfa antibiotics.  MEDICATIONS:  Current Outpatient Medications  Medication Sig Dispense Refill   acetaminophen (TYLENOL) 500 MG tablet Take 1,000 mg by mouth every 6 (six) hours as needed for moderate pain.     Biotin 5 MG TABS Take 5 mg by mouth daily.     buPROPion (WELLBUTRIN SR) 200 MG 12 hr tablet Take 1 tablet (200 mg total) by mouth 2 (two) times daily. (Patient taking differently: Take 200 mg by mouth 2 (two) times daily. Taking 200 mg daily, Taking temporarily due to LFT'S.) 60 tablet 0   Ca Phosphate-Cholecalciferol (EQL CALCIUM GUMMIES) 250-400 MG-UNIT CHEW Chew 1 tablet by mouth 2 (two) times daily.     escitalopram (LEXAPRO) 20 MG tablet TAKE 1 TABLET BY MOUTH ONCE A DAY (Patient taking differently: Take 20 mg by mouth daily. Taking 10 mg daily, temporarily due to LFT'S.) 90 tablet 3   lidocaine-prilocaine (EMLA) cream Apply to affected area once (Patient taking differently: Apply 1 application topically daily as needed (port access).) 30 g 3   LORazepam (ATIVAN) 0.5 MG tablet Take 1 tablet (0.5 mg total) by mouth 2 (two) times daily as needed for anxiety. 30 tablet 0   metFORMIN (GLUCOPHAGE) 500 MG tablet Take 1 tablet (500 mg total) by mouth 2 (two) times daily with a meal. 60 tablet 0   mirabegron ER (MYRBETRIQ) 50 MG TB24 tablet Take 50 mg by mouth daily.     omeprazole (PRILOSEC) 20 MG capsule Take 1 capsule by mouth once daily (Patient taking differently: Take 20 mg by mouth daily.) 90 capsule 3   ondansetron (ZOFRAN) 8 MG tablet Take 1 tablet (8 mg total) by mouth every 8 (eight) hours as needed for nausea or vomiting. 60 tablet 0    Polyethyl Glycol-Propyl Glycol (SYSTANE OP) Place 1 drop into both eyes daily as needed (dry eyes).     Probiotic Product (ALIGN PO) Take 1 tablet by mouth daily.      prochlorperazine (COMPAZINE) 10 MG tablet Take 10 mg by mouth every 6 (six) hours as needed for nausea or vomiting.     No current facility-administered medications for this visit.   Facility-Administered Medications Ordered in Other Visits  Medication Dose Route Frequency Provider Last Rate Last Admin   sodium chloride flush (NS) 0.9 % injection 10 mL  10 mL Intracatheter PRN Alvy Bimler, Alayza Pieper, MD   10 mL at 11/14/20 1153    PHYSICAL EXAMINATION: ECOG PERFORMANCE STATUS: 1 - Symptomatic but completely ambulatory  Vitals:   11/14/20 0849  BP: 140/71  Pulse: 62  Resp: 18  Temp: (!) 96.8 F (36 C)  SpO2: 99%   Filed Weights   11/14/20 0849  Weight: 184 lb (83.5 kg)    GENERAL:alert, no distress and  comfortable SKIN: skin color, texture, turgor are normal, no rashes or significant lesions EYES: normal, Conjunctiva are pink and non-injected, sclera clear OROPHARYNX:no exudate, no erythema and lips, buccal mucosa, and tongue normal  NECK: supple, thyroid normal size, non-tender, without nodularity LYMPH:  no palpable lymphadenopathy in the cervical, axillary or inguinal LUNGS: clear to auscultation and percussion with normal breathing effort HEART: regular rate & rhythm and no murmurs and no lower extremity edema ABDOMEN:abdomen soft mild right upper quadrant discomfort without rebound or guarding Musculoskeletal:no cyanosis of digits and no clubbing  NEURO: alert & oriented x 3 with fluent speech, no focal motor/sensory deficits  LABORATORY DATA:  I have reviewed the data as listed    Component Value Date/Time   NA 139 11/13/2020 1531   NA 139 11/30/2019 0913   NA 141 09/24/2013 1131   K 4.3 11/13/2020 1531   K 4.5 09/24/2013 1131   CL 106 11/13/2020 1531   CO2 20 (L) 11/13/2020 1531   CO2 24 09/24/2013 1131    GLUCOSE 107 (H) 11/13/2020 1531   GLUCOSE 90 09/24/2013 1131   BUN 23 11/13/2020 1531   BUN 32 (H) 11/30/2019 0913   BUN 21.2 09/24/2013 1131   CREATININE 1.18 (H) 11/13/2020 1531   CREATININE 1.1 09/24/2013 1131   CALCIUM 9.7 11/13/2020 1531   CALCIUM 9.5 09/24/2013 1131   PROT 7.0 11/13/2020 1531   PROT 7.0 11/30/2019 0913   PROT 6.8 09/24/2013 1131   ALBUMIN 3.8 11/13/2020 1531   ALBUMIN 4.3 11/30/2019 0913   ALBUMIN 3.9 09/24/2013 1131   AST 19 11/13/2020 1531   AST 23 09/24/2013 1131   ALT 55 (H) 11/13/2020 1531   ALT 27 09/24/2013 1131   ALKPHOS 76 11/13/2020 1531   ALKPHOS 81 09/24/2013 1131   BILITOT 0.5 11/13/2020 1531   BILITOT 0.53 09/24/2013 1131   GFRNONAA 52 (L) 11/13/2020 1531   GFRAA 55 (L) 01/24/2020 1530   GFRAA 57 (L) 12/21/2019 1259    No results found for: SPEP, UPEP  Lab Results  Component Value Date   WBC 5.0 11/13/2020   NEUTROABS 1.9 11/13/2020   HGB 13.8 11/13/2020   HCT 40.0 11/13/2020   MCV 91.3 11/13/2020   PLT 271 11/13/2020      Chemistry      Component Value Date/Time   NA 139 11/13/2020 1531   NA 139 11/30/2019 0913   NA 141 09/24/2013 1131   K 4.3 11/13/2020 1531   K 4.5 09/24/2013 1131   CL 106 11/13/2020 1531   CO2 20 (L) 11/13/2020 1531   CO2 24 09/24/2013 1131   BUN 23 11/13/2020 1531   BUN 32 (H) 11/30/2019 0913   BUN 21.2 09/24/2013 1131   CREATININE 1.18 (H) 11/13/2020 1531   CREATININE 1.1 09/24/2013 1131      Component Value Date/Time   CALCIUM 9.7 11/13/2020 1531   CALCIUM 9.5 09/24/2013 1131   ALKPHOS 76 11/13/2020 1531   ALKPHOS 81 09/24/2013 1131   AST 19 11/13/2020 1531   AST 23 09/24/2013 1131   ALT 55 (H) 11/13/2020 1531   ALT 27 09/24/2013 1131   BILITOT 0.5 11/13/2020 1531   BILITOT 0.53 09/24/2013 1131

## 2020-11-14 NOTE — Assessment & Plan Note (Signed)
Her recent elevated liver enzymes are caused by Taxol Her liver enzymes are almost back to normal Per previous discussion, I have reduced the dose of her treatment We will monitor carefully She will continue reduced dose of Wellbutrin for now

## 2020-11-14 NOTE — Assessment & Plan Note (Signed)
She is fully recovered from side effects of treatment Her LFTs are almost completely normal Per previous discussion, I plan 25% dose reduction of Taxol After today's dose, she will get a week break In 2 weeks, she will return for treatment #3 and then a week later treatment #4

## 2020-11-14 NOTE — Patient Instructions (Signed)
[facility]  Discharge Instructions: Thank you for choosing [facility] to provide your oncology and hematology care.   If you have a lab appointment with the [facility], please go directly to the [facility] and check in at the registration area.   Wear comfortable clothing and clothing appropriate for easy access to any Portacath or PICC line.   We strive to give you quality time with your provider. You may need to reschedule your appointment if you arrive late (15 or more minutes).  Arriving late affects you and other patients whose appointments are after yours.  Also, if you miss three or more appointments without notifying the office, you may be dismissed from the clinic at the provider's discretion.      For prescription refill requests, have your pharmacy contact our office and allow 72 hours for refills to be completed.    Today you received the following chemotherapy and/or immunotherapy agents: Taxol    To help prevent nausea and vomiting after your treatment, we encourage you to take your nausea medication as directed.  BELOW ARE SYMPTOMS THAT SHOULD BE REPORTED IMMEDIATELY: *FEVER GREATER THAN 100.4 F (38 C) OR HIGHER *CHILLS OR SWEATING *NAUSEA AND VOMITING THAT IS NOT CONTROLLED WITH YOUR NAUSEA MEDICATION *UNUSUAL SHORTNESS OF BREATH *UNUSUAL BRUISING OR BLEEDING *URINARY PROBLEMS (pain or burning when urinating, or frequent urination) *BOWEL PROBLEMS (unusual diarrhea, constipation, pain near the anus) TENDERNESS IN MOUTH AND THROAT WITH OR WITHOUT PRESENCE OF ULCERS (sore throat, sores in mouth, or a toothache) UNUSUAL RASH, SWELLING OR PAIN  UNUSUAL VAGINAL DISCHARGE OR ITCHING   Items with * indicate a potential emergency and should be followed up as soon as possible or go to the Emergency Department if any problems should occur.  Please show the CHEMOTHERAPY ALERT CARD or IMMUNOTHERAPY ALERT CARD at check-in to the  Emergency Department and triage nurse.  Should you have questions after your visit or need to cancel or reschedule your appointment, please contact [facility]  Dept: [phone]  and follow the prompts.  Office hours are 8:00 a.m. to 4:30 p.m. Monday - Friday. Please note that voicemails left after 4:00 p.m. may not be returned until the following business day.  We are closed weekends and major holidays. You have access to a nurse at all times for urgent questions. Please call the main number to the clinic Dept: [phone] and follow the prompts.   For any non-urgent questions, you may also contact your provider using MyChart. We now offer e-Visits for anyone 70 and older to request care online for non-urgent symptoms. For details visit [email or web address].   Also download the MyChart app! Go to the app store, search "MyChart", open the app, select [facility], and log in with your MyChart username and password.  Due to Covid, a mask is required upon entering the hospital/clinic. If you do not have a mask, one will be given to you upon arrival. For doctor visits, patients may have 1 support person aged 25 or older with them. For treatment visits, patients cannot have anyone with them due to current Covid guidelines and our immunocompromised population.

## 2020-11-14 NOTE — Assessment & Plan Note (Signed)
Renal function is stable but she is prone to get dehydrated We discussed importance of oral fluid intake

## 2020-11-14 NOTE — Progress Notes (Signed)
Chief Complaint:   OBESITY [name] is here to discuss her progress with her obesity treatment plan along with follow-up of her obesity related diagnoses.   Today's visit was #: 1 Starting weight: 209 lbs Starting date: 04/29/2018 Today's weight: 185 lbs Today's date: 11/01/2020 Weight change since last visit: 0 Total lbs lost to date: 24 lbs Body mass index is 29.86 kg/m.  Total weight loss percentage to date: -11.48%  Interim History:  [name]'s last office visit was 1 month ago.  She went away on vacation recently to [place].  Historically, she does not eat/skips foods and meals due to decreased hunger, thus at her last office visit we decreased her from Category 3 to Category 2 as she thought that would help.  No help because she is having episodes of nausea with vomiting now and still has decreased appetite.  She is following with her PCP for this.  Current Meal Plan: the Category 2 Plan with breakfast options for 75% of the time.  Current Exercise Plan: None.  Assessment/Plan:   Medications Discontinued During This Encounter  Medication Reason   cephALEXin (KEFLEX) 500 MG capsule Error   exemestane (AROMASIN) 25 MG tablet Error   methocarbamol (ROBAXIN) 500 MG tablet Error   neomycin-polymyxin-dexameth (MAXITROL) 0.1 % OINT Error   predniSONE (STERAPRED UNI-PAK 21 TAB) 5 MG (21) TBPK tablet Error   metFORMIN (GLUCOPHAGE) 500 MG tablet Reorder   buPROPion (WELLBUTRIN SR) 200 MG 12 hr tablet Reorder   Meds ordered this encounter  Medications   buPROPion (WELLBUTRIN SR) 200 MG 12 hr tablet    Sig: Take 1 tablet (200 mg total) by mouth 2 (two) times daily.    Dispense:  60 tablet    Refill:  0    30 d supply; ov for RF   metFORMIN (GLUCOPHAGE) 500 MG tablet    Sig: Take 1 tablet (500 mg total) by mouth 2 (two) times daily with a meal.    Dispense:  60 tablet    Refill:  0    30 d supply; ov for RF    1. Insulin resistance Not at goal. Goal is HgbA1c < 5.7, fasting  insulin closer to 5.  Medication: metformin 500 twice daily.  Having carb cravings, but not eating her proteins or eating foods on plan.  Dinner is 3 [employer] and [employer].  Occasionally skips doses.  Plan:  Will refill metformin today.  She will continue to focus on protein-rich, low simple carbohydrate foods. We reviewed the importance of hydration, regular exercise for stress reduction, and restorative sleep.   Lab Results  Component Value Date   HGBA1C 5.2 11/30/2019   Lab Results  Component Value Date   INSULIN 23.8 11/30/2019   INSULIN 16.5 05/31/2019   INSULIN 17.6 11/19/2018   INSULIN 25.8 (H) 04/29/2018   - Refill metFORMIN (GLUCOPHAGE) 500 MG tablet; Take 1 tablet (500 mg total) by mouth 2 (two) times daily with a meal.  Dispense: 60 tablet; Refill: 0  2. Other depression, with emotional eating Controlled. Medication: Wellbutrin 200 mg twice daily.  Mediation is decreasing cravings, improving mood.  Sleeping okay except for change in sleep patterns.  Plan:  Refill Wellbutrin today.  Behavior modification techniques were discussed today to help deal with emotional/non-hunger eating behaviors.  - Refill buPROPion (WELLBUTRIN SR) 200 MG 12 hr tablet; Take 1 tablet (200 mg total) by mouth 2 (two) times daily.  Dispense: 60 tablet; Refill: 0  3. Obesity BMI  today 29  Course: [name] is currently in the action stage of change. As such, her goal is to continue with weight loss efforts.   Nutrition goals: She has agreed to keeping a food journal and adhering to recommended goals of 1300-1400 calories and 90+ grams of protein.   Exercise goals: All adults should avoid inactivity. Some physical activity is better than none, and adults who participate in any amount of physical activity gain some health benefits.  Behavioral modification strategies: planning for success and keeping a strict food journal.  [name] has agreed to follow-up with our clinic in 3 weeks. She was informed  of the importance of frequent follow-up visits to maximize her success with intensive lifestyle modifications for her multiple health conditions.   Objective:   Blood pressure 132/72, pulse 66, temperature 98.3 F (36.8 C), height '5\' 6"'$  (1.676 m), weight 185 lb (83.9 kg), SpO2 96 %. Body mass index is 29.86 kg/m.  General: Cooperative, alert, well developed, in no acute distress. HEENT: Conjunctivae and lids unremarkable. Cardiovascular: Regular rhythm.  Lungs: Normal work of breathing. Neurologic: No focal deficits.   Lab Results  Component Value Date   CREATININE 1.18 (H) 11/13/2020   BUN 23 11/13/2020   NA 139 11/13/2020   K 4.3 11/13/2020   CL 106 11/13/2020   CO2 20 (L) 11/13/2020   Lab Results  Component Value Date   ALT 55 (H) 11/13/2020   AST 19 11/13/2020   ALKPHOS 76 11/13/2020   BILITOT 0.5 11/13/2020   Lab Results  Component Value Date   HGBA1C 5.2 11/30/2019   HGBA1C 5.3 05/31/2019   HGBA1C 5.2 11/19/2018   HGBA1C 5.5 04/29/2018   Lab Results  Component Value Date   INSULIN 23.8 11/30/2019   INSULIN 16.5 05/31/2019   INSULIN 17.6 11/19/2018   INSULIN 25.8 (H) 04/29/2018   Lab Results  Component Value Date   TSH 0.707 11/30/2019   Lab Results  Component Value Date   CHOL 224 (H) 11/30/2019   HDL 52 11/30/2019   LDLCALC 136 (H) 11/30/2019   TRIG 201 (H) 11/30/2019   Lab Results  Component Value Date   VD25OH 59.0 11/30/2019   VD25OH 54.0 05/31/2019   VD25OH 42.8 11/19/2018   Lab Results  Component Value Date   WBC 5.0 11/13/2020   HGB 13.8 11/13/2020   HCT 40.0 11/13/2020   MCV 91.3 11/13/2020   PLT 271 11/13/2020   Obesity Behavioral Intervention:   Approximately 15 minutes were spent on the discussion below.  ASK: We discussed the diagnosis of obesity with [name] today and [name] agreed to give [name] permission to discuss obesity behavioral modification therapy today.  ASSESS: [name] has the diagnosis of obesity and her BMI today is  29.9. [name] is in the action stage of change.   ADVISE: [name] was educated on the multiple health risks of obesity as well as the benefit of weight loss to improve her health. She was advised of the need for long term treatment and the importance of lifestyle modifications to improve her current health and to decrease her risk of future health problems.  AGREE: Multiple dietary modification options and treatment options were discussed and [name] agreed to follow the recommendations documented in the above note.  ARRANGE: [name] was educated on the importance of frequent visits to treat obesity as outlined per CMS and USPSTF guidelines and agreed to schedule her next follow up appointment today.  Attestation Statements:   Reviewed by clinician on day of  visit: allergies, medications, problem list, medical history, surgical history, family history, social history, and previous encounter notes.  I, Water quality scientist, CMA, am acting as Location manager for [employer], DO.  I have reviewed the above documentation for accuracy and completeness, and I agree with the above. Marjory Sneddon, D.O.  The [place] was signed into law in 2016 which includes the topic of electronic health records.  This provides immediate access to information in MyChart.  This includes consultation notes, operative notes, office notes, lab results and pathology reports.  If you have any questions about what you read please let us know at your next visit so we can discuss your concerns and take corrective action if need be.  We are right here with you.

## 2020-11-15 ENCOUNTER — Encounter: Payer: Self-pay | Admitting: Hematology and Oncology

## 2020-11-22 ENCOUNTER — Other Ambulatory Visit (HOSPITAL_COMMUNITY): Payer: Self-pay

## 2020-11-22 ENCOUNTER — Encounter (INDEPENDENT_AMBULATORY_CARE_PROVIDER_SITE_OTHER): Payer: Self-pay | Admitting: Family Medicine

## 2020-11-22 ENCOUNTER — Other Ambulatory Visit: Payer: Self-pay

## 2020-11-22 ENCOUNTER — Ambulatory Visit (INDEPENDENT_AMBULATORY_CARE_PROVIDER_SITE_OTHER): Payer: 59 | Admitting: Family Medicine

## 2020-11-22 VITALS — BP 111/65 | HR 71 | Temp 98.2°F | Ht 66.0 in | Wt 184.0 lb

## 2020-11-22 DIAGNOSIS — F3289 Other specified depressive episodes: Secondary | ICD-10-CM

## 2020-11-22 DIAGNOSIS — E669 Obesity, unspecified: Secondary | ICD-10-CM

## 2020-11-22 DIAGNOSIS — Z6833 Body mass index (BMI) 33.0-33.9, adult: Secondary | ICD-10-CM

## 2020-11-22 DIAGNOSIS — Z9189 Other specified personal risk factors, not elsewhere classified: Secondary | ICD-10-CM | POA: Diagnosis not present

## 2020-11-22 DIAGNOSIS — E66811 Obesity, class 1: Secondary | ICD-10-CM

## 2020-11-22 DIAGNOSIS — E8881 Metabolic syndrome: Secondary | ICD-10-CM

## 2020-11-22 DIAGNOSIS — E88819 Insulin resistance, unspecified: Secondary | ICD-10-CM

## 2020-11-22 MED ORDER — BUPROPION HCL ER (SR) 200 MG PO TB12
200.0000 mg | ORAL_TABLET | Freq: Two times a day (BID) | ORAL | 0 refills | Status: DC
  Filled 2020-11-22: qty 60, 30d supply, fill #0

## 2020-11-22 MED ORDER — METFORMIN HCL 500 MG PO TABS
500.0000 mg | ORAL_TABLET | Freq: Two times a day (BID) | ORAL | 0 refills | Status: DC
Start: 2020-11-22 — End: 2020-12-12
  Filled 2020-11-22: qty 60, 30d supply, fill #0

## 2020-11-23 ENCOUNTER — Other Ambulatory Visit: Payer: Self-pay | Admitting: Hematology and Oncology

## 2020-11-23 ENCOUNTER — Telehealth: Payer: Self-pay

## 2020-11-23 ENCOUNTER — Inpatient Hospital Stay: Payer: 59 | Attending: Hematology

## 2020-11-23 VITALS — BP 133/76 | HR 66 | Temp 97.6°F | Resp 18

## 2020-11-23 DIAGNOSIS — Z5111 Encounter for antineoplastic chemotherapy: Secondary | ICD-10-CM | POA: Diagnosis not present

## 2020-11-23 DIAGNOSIS — C569 Malignant neoplasm of unspecified ovary: Secondary | ICD-10-CM | POA: Insufficient documentation

## 2020-11-23 DIAGNOSIS — R110 Nausea: Secondary | ICD-10-CM | POA: Insufficient documentation

## 2020-11-23 DIAGNOSIS — N183 Chronic kidney disease, stage 3 unspecified: Secondary | ICD-10-CM | POA: Diagnosis not present

## 2020-11-23 MED ORDER — ONDANSETRON HCL 4 MG/2ML IJ SOLN
INTRAMUSCULAR | Status: AC
  Filled 2020-11-23: qty 4

## 2020-11-23 MED ORDER — SODIUM CHLORIDE 0.9 % IV SOLN: Freq: Once | INTRAVENOUS | Status: DC

## 2020-11-23 MED ORDER — SODIUM CHLORIDE 0.9 % IV SOLN
Freq: Once | INTRAVENOUS | Status: AC
  Filled 2020-11-23: qty 250

## 2020-11-23 MED ORDER — HEPARIN SOD (PORK) LOCK FLUSH 100 UNIT/ML IV SOLN
500.0000 [IU] | Freq: Once | INTRAVENOUS | Status: AC | PRN
  Administered 2020-11-23: 500 [IU]
  Filled 2020-11-23: qty 5

## 2020-11-23 MED ORDER — ONDANSETRON HCL 4 MG/2ML IJ SOLN
8.0000 mg | Freq: Once | INTRAMUSCULAR | Status: AC
  Administered 2020-11-23: 8 mg via INTRAVENOUS

## 2020-11-23 MED ORDER — SODIUM CHLORIDE 0.9% FLUSH
10.0000 mL | Freq: Once | INTRAVENOUS | Status: AC | PRN
  Administered 2020-11-23: 10 mL
  Filled 2020-11-23: qty 10

## 2020-11-23 NOTE — Progress Notes (Signed)
Chief Complaint:   OBESITY [name] is here to discuss her progress with her obesity treatment plan along with follow-up of her obesity related diagnoses. [name] is on keeping a food journal and adhering to recommended goals of 1300-1400 calories and 90+ grams protein and states she is following her eating plan approximately 0% of the time. [name] states she is not currently exercising.  Today's visit was #: 57 Starting weight: 209 lbs Starting date: 04/29/2018 Today's weight: 184 lbs Today's date: 11/22/2020 Total lbs lost to date: 25 Total lbs lost since last in-office visit: 1  Interim History: [name] didn't eat much for 1 week because of diarrhea, nausea, and abdominal discomfort, but when she did eat, it was ice cream, donuts, etc. Pharmacist recommended she decrease dose of Lexapro and Wellbutrin. Journaling has been challenging and pt wished to go back to a category plan.  Subjective:   1. Other depression, with emotional eating [name] reports increase in cravings since decreasing Wellbutrin dose secondary to Taxol doses, which caused increased liver ALT. Her recent ALT was 524 two weeks ago and AST 184. Nine days ago, level were back to 55 and 19, respectively.  2. Insulin resistance [name] has a diagnosis of insulin resistance based on her elevated fasting insulin level >5. She continues to work on diet and exercise to decrease her risk of diabetes.  Lab Results  Component Value Date   INSULIN 23.8 11/30/2019   INSULIN 16.5 05/31/2019   INSULIN 17.6 11/19/2018   INSULIN 25.8 (H) 04/29/2018   Lab Results  Component Value Date   HGBA1C 5.2 11/30/2019   Assessment/Plan:  No orders of the defined types were placed in this encounter.   Medications Discontinued During This Encounter  Medication Reason   buPROPion (WELLBUTRIN SR) 200 MG 12 hr tablet Reorder   metFORMIN (GLUCOPHAGE) 500 MG tablet Reorder     Meds ordered this encounter  Medications   buPROPion (WELLBUTRIN  SR) 200 MG 12 hr tablet    Sig: Take 1 tablet (200 mg total) by mouth 2 (two) times daily.    Dispense:  60 tablet    Refill:  0    30 d supply; ov for RF   metFORMIN (GLUCOPHAGE) 500 MG tablet    Sig: Take 1 tablet (500 mg total) by mouth 2 (two) times daily with a meal.    Dispense:  60 tablet    Refill:  0    30 d supply; ov for RF     1. Other depression, with emotional eating Pt will discuss with her doctor if it's okay to go back to bupropion BID, prior to doing so.  Refill- buPROPion (WELLBUTRIN SR) 200 MG 12 hr tablet; Take 1 tablet (200 mg total) by mouth 2 (two) times daily.  Dispense: 60 tablet; Refill: 0  2. Insulin resistance [name] will continue to work on weight loss, exercise, and decreasing simple carbohydrates to help decrease the risk of diabetes. [name] agreed to follow-up with [name] as directed to closely monitor her progress.   Refill- metFORMIN (GLUCOPHAGE) 500 MG tablet; Take 1 tablet (500 mg total) by mouth 2 (two) times daily with a meal.  Dispense: 60 tablet; Refill: 0  3. At risk for malnutrition [name] was given extensive malnutrition prevention education and counseling today of more than 9 minutes.  Counseled her that malnutrition refers to inappropriate nutrients or not the right balance of nutrients for optimal health.  Discussed with [name] that it is absolutely  possible to be malnourished but yet obese.  Risk factors, including but not limited to, inappropriate dietary choices, difficulty with obtaining food due to physical or financial limitations, and various physical and mental health conditions were reviewed with [name].   4. Obesity with current BMI of 29.8  [name] is currently in the action stage of change. As such, her goal is to continue with weight loss efforts. She has agreed to the Category 2 Plan.   Exercise goals: All adults should avoid inactivity. Some physical activity is better than none, and adults who participate in any amount of  physical activity gain some health benefits.  Behavioral modification strategies: increasing lean protein intake and decreasing simple carbohydrates.  [name] has agreed to follow-up with our clinic in 3 weeks. She was informed of the importance of frequent follow-up visits to maximize her success with intensive lifestyle modifications for her multiple health conditions.   Objective:   Blood pressure 111/65, pulse 71, temperature 98.2 F (36.8 C), height '5\' 6"'$  (1.676 m), weight 184 lb (83.5 kg), SpO2 97 %. Body mass index is 29.7 kg/m.  General: Cooperative, alert, well developed, in no acute distress. HEENT: Conjunctivae and lids unremarkable. Cardiovascular: Regular rhythm.  Lungs: Normal work of breathing. Neurologic: No focal deficits.   Lab Results  Component Value Date   CREATININE 1.18 (H) 11/13/2020   BUN 23 11/13/2020   NA 139 11/13/2020   K 4.3 11/13/2020   CL 106 11/13/2020   CO2 20 (L) 11/13/2020   Lab Results  Component Value Date   ALT 55 (H) 11/13/2020   AST 19 11/13/2020   ALKPHOS 76 11/13/2020   BILITOT 0.5 11/13/2020   Lab Results  Component Value Date   HGBA1C 5.2 11/30/2019   HGBA1C 5.3 05/31/2019   HGBA1C 5.2 11/19/2018   HGBA1C 5.5 04/29/2018   Lab Results  Component Value Date   INSULIN 23.8 11/30/2019   INSULIN 16.5 05/31/2019   INSULIN 17.6 11/19/2018   INSULIN 25.8 (H) 04/29/2018   Lab Results  Component Value Date   TSH 0.707 11/30/2019   Lab Results  Component Value Date   CHOL 224 (H) 11/30/2019   HDL 52 11/30/2019   LDLCALC 136 (H) 11/30/2019   TRIG 201 (H) 11/30/2019   Lab Results  Component Value Date   VD25OH 59.0 11/30/2019   VD25OH 54.0 05/31/2019   VD25OH 42.8 11/19/2018   Lab Results  Component Value Date   WBC 5.0 11/13/2020   HGB 13.8 11/13/2020   HCT 40.0 11/13/2020   MCV 91.3 11/13/2020   PLT 271 11/13/2020    Attestation Statements:   Reviewed by clinician on day of visit: allergies, medications,  problem list, medical history, surgical history, family history, social history, and previous encounter notes.  Coral Ceo, CMA, am acting as transcriptionist for [employer], DO.  I have reviewed the above documentation for accuracy and completeness, and I agree with the above. Marjory Sneddon, D.O.  The [place] was signed into law in 2016 which includes the topic of electronic health records.  This provides immediate access to information in MyChart.  This includes consultation notes, operative notes, office notes, lab results and pathology reports.  If you have any questions about what you read please let us know at your next visit so we can discuss your concerns and take corrective action if need be.  We are right here with you.

## 2020-11-23 NOTE — Patient Instructions (Signed)

## 2020-11-23 NOTE — Telephone Encounter (Signed)
Requesting IV fluids. Vomited last night and feeling weak. Infusion appt today for IV fluids, she is aware of appt times.

## 2020-11-24 ENCOUNTER — Telehealth: Payer: Self-pay

## 2020-11-24 NOTE — Telephone Encounter (Signed)
-----   Message from Heath Lark, MD sent at 11/24/2020  8:17 AM EDT ----- Can you call her today and check on her?

## 2020-11-24 NOTE — Telephone Encounter (Signed)
Called and given below message. She verbalized understanding. She denies nausea/vomiting. Does not feel that she needs IV fluids. Only complaint is intermittent right quadrant pain. She is taking tylenol and getting relief. Instructed to call the office back if needed. She verbalized understanding.

## 2020-11-27 ENCOUNTER — Other Ambulatory Visit: Payer: Self-pay

## 2020-11-27 ENCOUNTER — Inpatient Hospital Stay: Payer: 59

## 2020-11-27 DIAGNOSIS — Z7189 Other specified counseling: Secondary | ICD-10-CM

## 2020-11-27 DIAGNOSIS — C569 Malignant neoplasm of unspecified ovary: Secondary | ICD-10-CM

## 2020-11-27 DIAGNOSIS — N183 Chronic kidney disease, stage 3 unspecified: Secondary | ICD-10-CM | POA: Diagnosis not present

## 2020-11-27 DIAGNOSIS — Z5111 Encounter for antineoplastic chemotherapy: Secondary | ICD-10-CM | POA: Diagnosis not present

## 2020-11-27 DIAGNOSIS — R110 Nausea: Secondary | ICD-10-CM | POA: Diagnosis not present

## 2020-11-27 LAB — CMP (CANCER CENTER ONLY)
ALT: 21 U/L (ref 0–44)
AST: 20 U/L (ref 15–41)
Albumin: 3.9 g/dL (ref 3.5–5.0)
Alkaline Phosphatase: 66 U/L (ref 38–126)
Anion gap: 11 (ref 5–15)
BUN: 24 mg/dL — ABNORMAL HIGH (ref 8–23)
CO2: 27 mmol/L (ref 22–32)
Calcium: 10.1 mg/dL (ref 8.9–10.3)
Chloride: 102 mmol/L (ref 98–111)
Creatinine: 1.43 mg/dL — ABNORMAL HIGH (ref 0.44–1.00)
GFR, Estimated: 41 mL/min — ABNORMAL LOW (ref >60)
Glucose, Bld: 91 mg/dL (ref 70–99)
Potassium: 4.4 mmol/L (ref 3.5–5.1)
Sodium: 140 mmol/L (ref 135–145)
Total Bilirubin: 0.6 mg/dL (ref 0.3–1.2)
Total Protein: 7.2 g/dL (ref 6.5–8.1)

## 2020-11-27 LAB — CBC WITH DIFFERENTIAL (CANCER CENTER ONLY)
Abs Immature Granulocytes: 0.02 10*3/uL (ref 0.00–0.07)
Basophils Absolute: 0.1 10*3/uL (ref 0.0–0.1)
Basophils Relative: 1 %
Eosinophils Absolute: 0.2 10*3/uL (ref 0.0–0.5)
Eosinophils Relative: 3 %
HCT: 40.8 % (ref 36.0–46.0)
Hemoglobin: 14 g/dL (ref 12.0–15.0)
Immature Granulocytes: 0 %
Lymphocytes Relative: 38 %
Lymphs Abs: 2.6 10*3/uL (ref 0.7–4.0)
MCH: 32.1 pg (ref 26.0–34.0)
MCHC: 34.3 g/dL (ref 30.0–36.0)
MCV: 93.6 fL (ref 80.0–100.0)
Monocytes Absolute: 0.6 10*3/uL (ref 0.1–1.0)
Monocytes Relative: 9 %
Neutro Abs: 3.3 10*3/uL (ref 1.7–7.7)
Neutrophils Relative %: 49 %
Platelet Count: 270 10*3/uL (ref 150–400)
RBC: 4.36 MIL/uL (ref 3.87–5.11)
RDW: 12.4 % (ref 11.5–15.5)
WBC Count: 6.7 10*3/uL (ref 4.0–10.5)
nRBC: 0 % (ref 0.0–0.2)

## 2020-11-28 ENCOUNTER — Inpatient Hospital Stay (HOSPITAL_BASED_OUTPATIENT_CLINIC_OR_DEPARTMENT_OTHER): Payer: 59 | Admitting: Hematology and Oncology

## 2020-11-28 ENCOUNTER — Other Ambulatory Visit: Payer: Self-pay | Admitting: Hematology and Oncology

## 2020-11-28 ENCOUNTER — Encounter: Payer: Self-pay | Admitting: Hematology and Oncology

## 2020-11-28 ENCOUNTER — Inpatient Hospital Stay: Payer: 59

## 2020-11-28 DIAGNOSIS — C569 Malignant neoplasm of unspecified ovary: Secondary | ICD-10-CM

## 2020-11-28 DIAGNOSIS — R110 Nausea: Secondary | ICD-10-CM

## 2020-11-28 DIAGNOSIS — Z5111 Encounter for antineoplastic chemotherapy: Secondary | ICD-10-CM | POA: Diagnosis not present

## 2020-11-28 DIAGNOSIS — N183 Chronic kidney disease, stage 3 unspecified: Secondary | ICD-10-CM

## 2020-11-28 DIAGNOSIS — Z7189 Other specified counseling: Secondary | ICD-10-CM

## 2020-11-28 MED ORDER — SODIUM CHLORIDE 0.9 % IV SOLN
60.0000 mg/m2 | Freq: Once | INTRAVENOUS | Status: AC
  Administered 2020-11-28: 120 mg via INTRAVENOUS
  Filled 2020-11-28: qty 20

## 2020-11-28 MED ORDER — FAMOTIDINE 20 MG IN NS 100 ML IVPB
20.0000 mg | Freq: Once | INTRAVENOUS | Status: AC
  Administered 2020-11-28: 20 mg via INTRAVENOUS

## 2020-11-28 MED ORDER — SODIUM CHLORIDE 0.9% FLUSH
10.0000 mL | INTRAVENOUS | Status: DC | PRN
Start: 2020-11-28 — End: 2020-11-28
  Administered 2020-11-28: 10 mL
  Filled 2020-11-28: qty 10

## 2020-11-28 MED ORDER — SODIUM CHLORIDE 0.9 % IV SOLN
Freq: Once | INTRAVENOUS | Status: AC
  Filled 2020-11-28: qty 250

## 2020-11-28 MED ORDER — DIPHENHYDRAMINE HCL 50 MG/ML IJ SOLN
INTRAMUSCULAR | Status: AC
  Filled 2020-11-28: qty 1

## 2020-11-28 MED ORDER — SODIUM CHLORIDE 0.9 % IV SOLN
10.0000 mg | Freq: Once | INTRAVENOUS | Status: AC
  Administered 2020-11-28: 10 mg via INTRAVENOUS
  Filled 2020-11-28: qty 10

## 2020-11-28 MED ORDER — FAMOTIDINE 20 MG IN NS 100 ML IVPB
INTRAVENOUS | Status: AC
  Filled 2020-11-28: qty 100

## 2020-11-28 MED ORDER — HEPARIN SOD (PORK) LOCK FLUSH 100 UNIT/ML IV SOLN
500.0000 [IU] | Freq: Once | INTRAVENOUS | Status: AC | PRN
  Administered 2020-11-28: 500 [IU]
  Filled 2020-11-28: qty 5

## 2020-11-28 MED ORDER — DIPHENHYDRAMINE HCL 50 MG/ML IJ SOLN
25.0000 mg | Freq: Once | INTRAMUSCULAR | Status: AC
  Administered 2020-11-28: 25 mg via INTRAVENOUS

## 2020-11-28 NOTE — Assessment & Plan Note (Signed)
She tolerated recent reduced dose Taxol well She will continue day 1 and day 8 treatment, rest day 15 I plan to repeat imaging study after 3 months of treatment

## 2020-11-28 NOTE — Progress Notes (Signed)
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   All questions were answered. The patient knows to call the clinic with any problems, questions or concerns. The total time spent in the appointment was 20 minutes encounter with patients including review of chart and various tests results, discussions about plan of care and coordination of care plan   Heath Lark, MD 11/28/2020 9:29 AM  INTERVAL HISTORY: Please see below for problem oriented charting. She returns for further follow-up She is doing well Denies significant neuropathy from treatment She has mild intermittent nausea but no vomiting Denies recent constipation No recent infection  SUMMARY OF ONCOLOGIC HISTORY: Oncology History Overview Note  2018 - Core biopsy for ER/PR and Foundation One testing performed. Unfortunately, no sufficient tissue for Foundation One testing. ER was 50% and PR 90%. 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     - 09/2000 Chemotherapy   6 cycles of IP cisplatin and etoposide    02/2009 Relapse/Recurrence   CT showed increased size of nodules in pelvis   2010 Surgery   Exlap with section of tumor nodules near cecum and left pelvic sidewall. Tumor: ER negative, PR positive    Treatment Plan Change   Alternated 2 week courses of Megace and Tamoxifen - ended 03/2012   03/2012 PET scan   CT - progressive disease   2013 Treatment Plan Change   Letrozole   01/2016 Imaging   MRI showed progressive disease   2017 Treatment Plan Change   Two weeks of alternating Tamoxifen 29m daily and then Megace 457mTID   08/2016 Imaging   MRI showed progressive disease with peritoneal implants near liver, in pelvis   01/2017 Treatment Plan Change   Lupron 11.25 q 3 months   11/2017 Imaging   Overall mixed response.   Mixed cystic/solid lesions in the left pelvis are mildly improved.   Cystic peritoneal disease, including the dominant lesion along the posterior right hepatic lobe, is mildly progressed.   Subcapsular lesion along the posterior right hepatic lobe is unchanged.   03/2018 Imaging   MRI A/P: Mixed response of individual peritoneal metastases in the pelvis, as described above. Overall, there has been no significant change in bulk of disease.   Stable cystic peritoneal metastatic disease along the capsular surfaces of the liver and spleen.   No new  sites of metastatic disease identified within the abdomen or pelvis.   08/2018 Imaging   MRI A/P: Status post hysterectomy and bilateral salpingo-oophorectomy.   Mixed cystic/solid peritoneal implants in the abdomen/pelvis, as above. Dominant cystic implant along the posterior liver  surface is mildly increased. Remaining lesions are overall grossly unchanged.   No new lesions are identified.   01/28/2019 Imaging   Mri A/P: 1. Relatively similar appearance of peritoneal metastasis. A posterior right hepatic capsular based lesion is similar to minimally decreased in size. Left pelvic implants are primarily similar with possible enlargement of an anterior high left pelvic cystic implant. No new disease identified. 2.  Aortic Atherosclerosis (ICD10-I70.0). 3. Hepatic steatosis. 4. Left adrenal adenoma.   06/02/2019 Imaging   MRI 1. Potential slight enlargement of dominant cystic area and solid component, associated with rind like signal variation on T2 along the inferior right hepatic margin, also potentially slightly increased. Findings may still be within the realm of measurement and technical variation. Close attention on follow-up. 2. Subtle cystic changes along the cephalad margin of the spleen are difficult to see on previous imaging, perhaps new compared with prior imaging studies. 3. Pelvic implants and left lower quadrant lesion with similar size, of the area along the left iliac vasculature may be slightly larger than on the prior study. 4. Signs of extensive retroperitoneal and pelvic lymphadenectomy. 5. Hepatic steatosis. 6. 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     06/08/2019 Cancer Staging   Staging form: [place], AJCC 7th Edition - Clinical: Stage IIIC (rT2, N1, M0) - Signed by Heath Lark, MD on 06/08/2019    06/10/2019 Imaging   1. 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 These findings are slightly worsened in comparison to a remote prior CT examination dated 10/04/2014.   2.  No evidence of metastatic disease in the chest.    3. Status post hysterectomy, pelvic and retroperitoneal lymph node dissection, and ventral hernia mesh repair.   4.  Hepatic steatosis.   5.  Aortic Atherosclerosis (ICD10-I70.0).   06/24/2019 - 09/02/2019 Chemotherapy   The patient had carboplatin for chemotherapy treatment.     09/02/2019 Tumor Marker   Patient's tumor was tested for the following markers: Inhibin B Results of the tumor marker test revealed 94   09/23/2019 Imaging   1. Interval progression of the soft tissue lesions in the anterior left pelvis, likely peritoneal implants, compatible with disease progression. Remaining sites of apparent disease along the liver capsule and posterior spleen are stable 2. Stable 2 cm left adrenal adenoma. 3. Hepatic steatosis. 4. Right-side predominant colonic diverticulosis without diverticulitis. 5. Aortic Atherosclerosis (ICD10-I70.0).   12/23/2019 Imaging   1. Slight interval increase in size of a mixed solid and cystic nodule in the left hemipelvis measuring 3.0 x 2.9 cm, previously 2.7 x 2.1 cm. 2. Interval decrease in size of a nodule in the left paracolic gutter measuring 1.0 x 0.8 cm, previously 2.1 x 1.6 cm. 3. Stable peritoneal nodules in the hepatorenal recess and at the inferior tip of the spleen. 4. Unchanged nodule or lymph node overlying the left external iliac artery. 5. Enlargement of dominant left pelvic nodule is concerning for disease progression despite interval decrease in size of a nodule in the left paracolic gutter and stability of other  nodules. 6. No evidence of metastatic disease in the chest. 7. Stable, benign left adrenal adenoma. 8. Ventral hernia mesh repair with a small component of recurrent hernia inferiorly, containing a single nonobstructed loop of small bowel. 9. Hepatic steatosis. 10. Aortic Atherosclerosis (ICD10-I70.0).   12/23/2019 Tumor Marker   Patient's tumor was tested for the following markers: Inhibin B Results of the tumor marker test  revealed 94.9   01/25/2020 Tumor Marker   Patient's tumor was tested for the following markers: Inhibin B Results of the tumor marker test revealed 116.7   02/20/2020 Genetic Testing   Negative genetic testing: no pathogenic variants detected in [facility] Multi-Cancer Panel.  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  The report date is February 20, 2020.   The Multi-Cancer Panel offered by [facility] includes sequencing and/or deletion duplication testing of the following 85 genes: AIP, ALK, APC, ATM, AXIN2,BAP1,  BARD1, BLM, BMPR1A, BRCA1, BRCA2, BRIP1, CASR, CDC73, CDH1, CDK4, CDKN1B, CDKN1C, CDKN2A (p14ARF), CDKN2A (p16INK4a), CEBPA, CHEK2, CTNNA1, DICER1, DIS3L2, EGFR (c.2369C>T, p.Thr790Met variant only), EPCAM (Deletion/duplication testing only), FH, FLCN, GATA2, GPC3, GREM1 (Promoter region deletion/duplication testing only), HOXB13 (c.251G>A, p.Gly84Glu), HRAS, KIT, MAX, MEN1, MET, MITF (c.952G>A, p.Glu318Lys variant only), MLH1, MSH2, MSH3, MSH6, MUTYH, NBN, NF1, NF2, NTHL1, PALB2, PDGFRA, PHOX2B, PMS2, POLD1, POLE, POT1, PRKAR1A, PTCH1, PTEN, RAD50, RAD51C, RAD51D, RB1, RECQL4, RET, RNF43, RUNX1, SDHAF2, SDHA (sequence changes only), SDHB, SDHC, SDHD, SMAD4, SMARCA4, SMARCB1, SMARCE1, STK11, SUFU, TERC, TERT, TMEM127, TP53, TSC1, TSC2, VHL, WRN and WT1.    03/02/2020 Tumor Marker   Patient's tumor was tested for the following markers: Inhibin B Results of the tumor marker test revealed 80.8   04/17/2020 Imaging   1. Overall, exam is stable. Multiple peritoneal nodules are again seen. The index nodule in the left lower quadrant is mildly increased in size in the interval. The lesion within the anterior left pelvis has decreased in size in the interval. There has also been decrease in size of cystic lesion within the a hepatorenal recess. The remaining peritoneal lesions are unchanged. No new lesions identified. 2. Stable left adrenal nodule. 3.  Aortic  Atherosclerosis (ICD10-I70.0).     08/21/2020 Imaging   Bone density is normal AP spine T score 0.8 Femoral neck on the left, T score -0.2 Femoral neck on the right ,T score -0.4   10/17/2020 Imaging   1. Interval progression of peritoneal nodules along the left pelvic sidewall, concerning for progressive metastatic disease. 2. Small capsular lesion medial right liver and the apparent cystic lesion superior to the right kidney are similar to prior. 3. Tiny soft tissue nodule anterior aspect of the lateral left pelvis described as decreasing on the prior study has decreased further on today's exam. 4. Stable left adrenal nodule. This cannot be definitively characterized. 5. Tiny nonobstructing stone lower pole right kidney. 6. Aortic Atherosclerosis (ICD10-I70.0).   10/26/2020 Procedure   Successful placement of a right internal jugular approach power injectable Port-A-Cath. The catheter is ready for immediate use.       10/31/2020 -  Chemotherapy    Patient is on Treatment Plan: OVARIAN PACLITAXEL L9,7,67,34 Q28D         REVIEW OF SYSTEMS:   Constitutional: Denies fevers, chills or abnormal weight loss Eyes: Denies blurriness of vision Ears, nose, mouth, throat, and face: Denies mucositis or sore throat Respiratory: Denies cough, dyspnea or wheezes Cardiovascular: Denies palpitation, chest discomfort or lower extremity swelling Skin: Denies abnormal skin rashes Lymphatics: Denies new lymphadenopathy or  easy bruising Neurological:Denies numbness, tingling or new weaknesses Behavioral/Psych: Mood is stable, no new changes  All other systems were reviewed with the patient and are negative.  I have reviewed the past medical history, past surgical history, social history and family history with the patient and they are unchanged from previous note.  ALLERGIES:  is allergic to codeine, hydrocodone-acetaminophen, oxycodone, sodium thiosalicylate, thimerosal, tramadol hcl,  ciprofloxacin, daypro [oxaprozin], levofloxacin, stadol [butorphanol tartrate], and sulfa antibiotics.  MEDICATIONS:  Current Outpatient Medications  Medication Sig Dispense Refill   acetaminophen (TYLENOL) 500 MG tablet Take 1,000 mg by mouth every 6 (six) hours as needed for moderate pain.     Biotin 5 MG TABS Take 5 mg by mouth daily.     buPROPion (WELLBUTRIN SR) 200 MG 12 hr tablet Take 1 tablet (200 mg total) by mouth 2 (two) times daily. 60 tablet 0   Ca Phosphate-Cholecalciferol (EQL CALCIUM GUMMIES) 250-400 MG-UNIT CHEW Chew 1 tablet by mouth 2 (two) times daily.     escitalopram (LEXAPRO) 20 MG tablet TAKE 1 TABLET BY MOUTH ONCE A DAY (Patient taking differently: Take 20 mg by mouth daily. Taking 10 mg daily, temporarily due to LFT'S.) 90 tablet 3   lidocaine-prilocaine (EMLA) cream Apply to affected area once (Patient taking differently: Apply 1 application topically daily as needed (port access).) 30 g 3   LORazepam (ATIVAN) 0.5 MG tablet Take 1 tablet (0.5 mg total) by mouth 2 (two) times daily as needed for anxiety. 30 tablet 0   metFORMIN (GLUCOPHAGE) 500 MG tablet Take 1 tablet (500 mg total) by mouth 2 (two) times daily with a meal. 60 tablet 0   mirabegron ER (MYRBETRIQ) 50 MG TB24 tablet Take 50 mg by mouth daily.     omeprazole (PRILOSEC) 20 MG capsule Take 1 capsule by mouth once daily 90 capsule 3   ondansetron (ZOFRAN) 8 MG tablet Take 1 tablet (8 mg total) by mouth every 8 (eight) hours as needed for nausea or vomiting. 60 tablet 0   Polyethyl Glycol-Propyl Glycol (SYSTANE OP) Place 1 drop into both eyes daily as needed (dry eyes).     Probiotic Product (ALIGN PO) Take 1 tablet by mouth daily.      prochlorperazine (COMPAZINE) 10 MG tablet Take 10 mg by mouth every 6 (six) hours as needed for nausea or vomiting.     No current facility-administered medications for this visit.   Facility-Administered Medications Ordered in Other Visits  Medication Dose Route Frequency  Provider Last Rate Last Admin   dexamethasone (DECADRON) 10 mg in sodium chloride 0.9 % 50 mL IVPB  10 mg Intravenous Once Heath Lark, MD 204 mL/hr at 11/28/20 0926 10 mg at 11/28/20 0926   heparin lock flush 100 unit/mL  500 Units Intracatheter Once PRN Alvy Bimler, Delano Scardino, MD       PACLitaxel (TAXOL) 120 mg in sodium chloride 0.9 % 250 mL chemo infusion (</= 16m/m2)  60 mg/m2 (Treatment Plan Recorded) Intravenous Once GAlvy Bimler Tracy Kinner, MD       sodium chloride flush (NS) 0.9 % injection 10 mL  10 mL Intracatheter PRN GAlvy Bimler Nathyn Luiz, MD        PHYSICAL EXAMINATION: ECOG PERFORMANCE STATUS: 1 - Symptomatic but completely ambulatory  Vitals:   11/28/20 0803  BP: (!) 129/52  Pulse: 76  Resp: 18  Temp: 97.6 F (36.4 C)  SpO2: 99%   Filed Weights   11/28/20 0803  Weight: 187 lb 9.6 oz (85.1 kg)    GENERAL:alert, no distress  and comfortable SKIN: skin color, texture, turgor are normal, no rashes or significant lesions EYES: normal, Conjunctiva are pink and non-injected, sclera clear OROPHARYNX:no exudate, no erythema and lips, buccal mucosa, and tongue normal  NECK: supple, thyroid normal size, non-tender, without nodularity LYMPH:  no palpable lymphadenopathy in the cervical, axillary or inguinal LUNGS: clear to auscultation and percussion with normal breathing effort HEART: regular rate & rhythm and no murmurs and no lower extremity edema ABDOMEN:abdomen soft, non-tender and normal bowel sounds Musculoskeletal:no cyanosis of digits and no clubbing  NEURO: alert & oriented x 3 with fluent speech, no focal motor/sensory deficits  LABORATORY DATA:  I have reviewed the data as listed    Component Value Date/Time   NA 140 11/27/2020 1535   NA 139 11/30/2019 0913   NA 141 09/24/2013 1131   K 4.4 11/27/2020 1535   K 4.5 09/24/2013 1131   CL 102 11/27/2020 1535   CO2 27 11/27/2020 1535   CO2 24 09/24/2013 1131   GLUCOSE 91 11/27/2020 1535   GLUCOSE 90 09/24/2013 1131   BUN 24 (H) 11/27/2020  1535   BUN 32 (H) 11/30/2019 0913   BUN 21.2 09/24/2013 1131   CREATININE 1.43 (H) 11/27/2020 1535   CREATININE 1.1 09/24/2013 1131   CALCIUM 10.1 11/27/2020 1535   CALCIUM 9.5 09/24/2013 1131   PROT 7.2 11/27/2020 1535   PROT 7.0 11/30/2019 0913   PROT 6.8 09/24/2013 1131   ALBUMIN 3.9 11/27/2020 1535   ALBUMIN 4.3 11/30/2019 0913   ALBUMIN 3.9 09/24/2013 1131   AST 20 11/27/2020 1535   AST 23 09/24/2013 1131   ALT 21 11/27/2020 1535   ALT 27 09/24/2013 1131   ALKPHOS 66 11/27/2020 1535   ALKPHOS 81 09/24/2013 1131   BILITOT 0.6 11/27/2020 1535   BILITOT 0.53 09/24/2013 1131   GFRNONAA 41 (L) 11/27/2020 1535   GFRAA 55 (L) 01/24/2020 1530   GFRAA 57 (L) 12/21/2019 1259    No results found for: SPEP, UPEP  Lab Results  Component Value Date   WBC 6.7 11/27/2020   NEUTROABS 3.3 11/27/2020   HGB 14.0 11/27/2020   HCT 40.8 11/27/2020   MCV 93.6 11/27/2020   PLT 270 11/27/2020      Chemistry      Component Value Date/Time   NA 140 11/27/2020 1535   NA 139 11/30/2019 0913   NA 141 09/24/2013 1131   K 4.4 11/27/2020 1535   K 4.5 09/24/2013 1131   CL 102 11/27/2020 1535   CO2 27 11/27/2020 1535   CO2 24 09/24/2013 1131   BUN 24 (H) 11/27/2020 1535   BUN 32 (H) 11/30/2019 0913   BUN 21.2 09/24/2013 1131   CREATININE 1.43 (H) 11/27/2020 1535   CREATININE 1.1 09/24/2013 1131      Component Value Date/Time   CALCIUM 10.1 11/27/2020 1535   CALCIUM 9.5 09/24/2013 1131   ALKPHOS 66 11/27/2020 1535   ALKPHOS 81 09/24/2013 1131   AST 20 11/27/2020 1535   AST 23 09/24/2013 1131   ALT 21 11/27/2020 1535   ALT 27 09/24/2013 1131   BILITOT 0.6 11/27/2020 1535   BILITOT 0.53 09/24/2013 1131

## 2020-11-28 NOTE — Assessment & Plan Note (Signed)
She has history of intermittent nausea We discussed importance of taking antiemetics

## 2020-11-28 NOTE — Patient Instructions (Signed)
[facility]ICAL [facility]COL[facility]   Discharge Instructions: Thank you for choosing [facility] to provide your oncology and hematology care.   If you have a lab appointment with the Cancer Center, please go directly to the Cancer Center and check in at the registration area.   Wear comfortable clothing and clothing appropriate for easy access to any Portacath or PICC line.   We strive to give you quality time with your provider. You may need to reschedule your appointment if you arrive late (15 or more minutes).  Arriving late affects you and other patients whose appointments are after yours.  Also, if you miss three or more appointments without notifying the office, you may be dismissed from the clinic at the provider's discretion.      For prescription refill requests, have your pharmacy contact our office and allow 72 hours for refills to be completed.    Today you received the following chemotherapy and/or immunotherapy agents: paclitaxel.      To help prevent nausea and vomiting after your treatment, we encourage you to take your nausea medication as directed.  BELOW ARE SYMPTOMS THAT SHOULD BE REPORTED IMMEDIATELY: *FEVER GREATER THAN 100.4 F (38 C) OR HIGHER *CHILLS OR SWEATING *NAUSEA AND VOMITING THAT IS NOT CONTROLLED WITH YOUR NAUSEA MEDICATION *UNUSUAL SHORTNESS OF BREATH *UNUSUAL BRUISING OR BLEEDING *URINARY PROBLEMS (pain or burning when urinating, or frequent urination) *BOWEL PROBLEMS (unusual diarrhea, constipation, pain near the anus) TENDERNESS IN MOUTH AND THROAT WITH OR WITHOUT PRESENCE OF ULCERS (sore throat, sores in mouth, or a toothache) UNUSUAL RASH, SWELLING OR PAIN  UNUSUAL VAGINAL DISCHARGE OR ITCHING   Items with * indicate a potential emergency and should be followed up as soon as possible or go to the Emergency Department if any problems should occur.  Please show the CHEMOTHERAPY ALERT CARD or IMMUNOTHERAPY ALERT CARD at check-in  to the Emergency Department and triage nurse.  Should you have questions after your visit or need to cancel or reschedule your appointment, please contact [facility]  Dept: [phone]  and follow the prompts.  Office hours are 8:00 a.m. to 4:30 p.m. Monday - Friday. Please note that voicemails left after 4:00 p.m. may not be returned until the following business day.  We are closed weekends and major holidays. You have access to a nurse at all times for urgent questions. Please call the main number to the clinic Dept: [phone] and follow the prompts.   For any non-urgent questions, you may also contact your provider using MyChart. We now offer e-Visits for anyone 18 and older to request care online for non-urgent symptoms. For details visit [email or web address].   Also download the MyChart app! Go to the app store, search "MyChart", open the app, select [facility], and log in with your MyChart username and password.  Due to Covid, a mask is required upon entering the hospital/clinic. If you do not have a mask, one will be given to you upon arrival. For doctor visits, patients may have 1 support person aged 18 or older with them. For treatment visits, patients cannot have anyone with them due to current Covid guidelines and our immunocompromised population.   

## 2020-11-28 NOTE — Assessment & Plan Note (Signed)
Renal function is stable but she is prone to get dehydrated We discussed importance of oral fluid intake

## 2020-12-04 ENCOUNTER — Inpatient Hospital Stay: Payer: 59

## 2020-12-04 ENCOUNTER — Other Ambulatory Visit: Payer: Self-pay | Admitting: Hematology and Oncology

## 2020-12-04 ENCOUNTER — Other Ambulatory Visit: Payer: Self-pay

## 2020-12-04 DIAGNOSIS — Z5111 Encounter for antineoplastic chemotherapy: Secondary | ICD-10-CM | POA: Diagnosis not present

## 2020-12-04 DIAGNOSIS — C569 Malignant neoplasm of unspecified ovary: Secondary | ICD-10-CM

## 2020-12-04 DIAGNOSIS — R110 Nausea: Secondary | ICD-10-CM | POA: Diagnosis not present

## 2020-12-04 DIAGNOSIS — N183 Chronic kidney disease, stage 3 unspecified: Secondary | ICD-10-CM | POA: Diagnosis not present

## 2020-12-04 DIAGNOSIS — Z7189 Other specified counseling: Secondary | ICD-10-CM

## 2020-12-04 LAB — CMP (CANCER CENTER ONLY)
ALT: 19 U/L (ref 0–44)
AST: 18 U/L (ref 15–41)
Albumin: 3.9 g/dL (ref 3.5–5.0)
Alkaline Phosphatase: 62 U/L (ref 38–126)
Anion gap: 12 (ref 5–15)
BUN: 26 mg/dL — ABNORMAL HIGH (ref 8–23)
CO2: 27 mmol/L (ref 22–32)
Calcium: 9.5 mg/dL (ref 8.9–10.3)
Chloride: 101 mmol/L (ref 98–111)
Creatinine: 1.26 mg/dL — ABNORMAL HIGH (ref 0.44–1.00)
GFR, Estimated: 48 mL/min — ABNORMAL LOW (ref >60)
Glucose, Bld: 94 mg/dL (ref 70–99)
Potassium: 4.2 mmol/L (ref 3.5–5.1)
Sodium: 140 mmol/L (ref 135–145)
Total Bilirubin: 0.6 mg/dL (ref 0.3–1.2)
Total Protein: 7.1 g/dL (ref 6.5–8.1)

## 2020-12-04 LAB — CBC WITH DIFFERENTIAL (CANCER CENTER ONLY)
Abs Immature Granulocytes: 0.03 10*3/uL (ref 0.00–0.07)
Basophils Absolute: 0.1 10*3/uL (ref 0.0–0.1)
Basophils Relative: 1 %
Eosinophils Absolute: 0.2 10*3/uL (ref 0.0–0.5)
Eosinophils Relative: 3 %
HCT: 39.8 % (ref 36.0–46.0)
Hemoglobin: 13.6 g/dL (ref 12.0–15.0)
Immature Granulocytes: 1 %
Lymphocytes Relative: 44 %
Lymphs Abs: 2.5 10*3/uL (ref 0.7–4.0)
MCH: 31.7 pg (ref 26.0–34.0)
MCHC: 34.2 g/dL (ref 30.0–36.0)
MCV: 92.8 fL (ref 80.0–100.0)
Monocytes Absolute: 0.3 10*3/uL (ref 0.1–1.0)
Monocytes Relative: 6 %
Neutro Abs: 2.6 10*3/uL (ref 1.7–7.7)
Neutrophils Relative %: 45 %
Platelet Count: 315 10*3/uL (ref 150–400)
RBC: 4.29 MIL/uL (ref 3.87–5.11)
RDW: 12.4 % (ref 11.5–15.5)
WBC Count: 5.6 10*3/uL (ref 4.0–10.5)
nRBC: 0 % (ref 0.0–0.2)

## 2020-12-04 MED FILL — Dexamethasone Sodium Phosphate Inj 100 MG/10ML: INTRAMUSCULAR | Qty: 1 | Status: AC

## 2020-12-05 ENCOUNTER — Inpatient Hospital Stay: Payer: 59

## 2020-12-05 VITALS — BP 140/77 | HR 71 | Temp 98.0°F | Resp 16

## 2020-12-05 DIAGNOSIS — Z5111 Encounter for antineoplastic chemotherapy: Secondary | ICD-10-CM | POA: Diagnosis not present

## 2020-12-05 DIAGNOSIS — Z7189 Other specified counseling: Secondary | ICD-10-CM

## 2020-12-05 DIAGNOSIS — N183 Chronic kidney disease, stage 3 unspecified: Secondary | ICD-10-CM | POA: Diagnosis not present

## 2020-12-05 DIAGNOSIS — C569 Malignant neoplasm of unspecified ovary: Secondary | ICD-10-CM

## 2020-12-05 DIAGNOSIS — R110 Nausea: Secondary | ICD-10-CM | POA: Diagnosis not present

## 2020-12-05 MED ORDER — SODIUM CHLORIDE 0.9 % IV SOLN
10.0000 mg | Freq: Once | INTRAVENOUS | Status: AC
  Administered 2020-12-05: 10 mg via INTRAVENOUS
  Filled 2020-12-05: qty 10

## 2020-12-05 MED ORDER — SODIUM CHLORIDE 0.9 % IV SOLN: Freq: Once | INTRAVENOUS | Status: AC

## 2020-12-05 MED ORDER — SODIUM CHLORIDE 0.9% FLUSH
10.0000 mL | INTRAVENOUS | Status: DC | PRN
  Administered 2020-12-05: 10 mL

## 2020-12-05 MED ORDER — HEPARIN SOD (PORK) LOCK FLUSH 100 UNIT/ML IV SOLN
500.0000 [IU] | Freq: Once | INTRAVENOUS | Status: AC | PRN
  Administered 2020-12-05: 500 [IU]

## 2020-12-05 MED ORDER — SODIUM CHLORIDE 0.9 % IV SOLN
60.0000 mg/m2 | Freq: Once | INTRAVENOUS | Status: AC
  Administered 2020-12-05: 120 mg via INTRAVENOUS
  Filled 2020-12-05: qty 20

## 2020-12-05 MED ORDER — DIPHENHYDRAMINE HCL 50 MG/ML IJ SOLN
25.0000 mg | Freq: Once | INTRAMUSCULAR | Status: AC
  Administered 2020-12-05: 25 mg via INTRAVENOUS
  Filled 2020-12-05: qty 1

## 2020-12-05 MED ORDER — FAMOTIDINE 20 MG IN NS 100 ML IVPB
20.0000 mg | Freq: Once | INTRAVENOUS | Status: AC
  Administered 2020-12-05: 20 mg via INTRAVENOUS
  Filled 2020-12-05: qty 100

## 2020-12-05 NOTE — Patient Instructions (Signed)
[facility]COL[facility]   Discharge Instructions: Thank you for choosing [facility] to provide your oncology and hematology care.   If you have a lab appointment with the Cancer Center, please go directly to the Cancer Center and check in at the registration area.   Wear comfortable clothing and clothing appropriate for easy access to any Portacath or PICC line.   We strive to give you quality time with your provider. You may need to reschedule your appointment if you arrive late (15 or more minutes).  Arriving late affects you and other patients whose appointments are after yours.  Also, if you miss three or more appointments without notifying the office, you may be dismissed from the clinic at the provider's discretion.      For prescription refill requests, have your pharmacy contact our office and allow 72 hours for refills to be completed.    Today you received the following chemotherapy and/or immunotherapy agents: paclitaxel.      To help prevent nausea and vomiting after your treatment, we encourage you to take your nausea medication as directed.  BELOW ARE SYMPTOMS THAT SHOULD BE REPORTED IMMEDIATELY: *FEVER GREATER THAN 100.4 F (38 C) OR HIGHER *CHILLS OR SWEATING *NAUSEA AND VOMITING THAT IS NOT CONTROLLED WITH YOUR NAUSEA MEDICATION *UNUSUAL SHORTNESS OF BREATH *UNUSUAL BRUISING OR BLEEDING *URINARY PROBLEMS (pain or burning when urinating, or frequent urination) *BOWEL PROBLEMS (unusual diarrhea, constipation, pain near the anus) TENDERNESS IN MOUTH AND THROAT WITH OR WITHOUT PRESENCE OF ULCERS (sore throat, sores in mouth, or a toothache) UNUSUAL RASH, SWELLING OR PAIN  UNUSUAL VAGINAL DISCHARGE OR ITCHING   Items with * indicate a potential emergency and should be followed up as soon as possible or go to the Emergency Department if any problems should occur.  Please show the CHEMOTHERAPY ALERT CARD or IMMUNOTHERAPY ALERT CARD at check-in  to the Emergency Department and triage nurse.  Should you have questions after your visit or need to cancel or reschedule your appointment, please contact [facility]  Dept: [phone]  and follow the prompts.  Office hours are 8:00 a.m. to 4:30 p.m. Monday - Friday. Please note that voicemails left after 4:00 p.m. may not be returned until the following business day.  We are closed weekends and major holidays. You have access to a nurse at all times for urgent questions. Please call the main number to the clinic Dept: [phone] and follow the prompts.   For any non-urgent questions, you may also contact your provider using MyChart. We now offer e-Visits for anyone 18 and older to request care online for non-urgent symptoms. For details visit [email or web address].   Also download the MyChart app! Go to the app store, search "MyChart", open the app, select [facility], and log in with your MyChart username and password.  Due to Covid, a mask is required upon entering the hospital/clinic. If you do not have a mask, one will be given to you upon arrival. For doctor visits, patients may have 1 support person aged 18 or older with them. For treatment visits, patients cannot have anyone with them due to current Covid guidelines and our immunocompromised population.   

## 2020-12-05 NOTE — Progress Notes (Signed)
Error

## 2020-12-06 DIAGNOSIS — C569 Malignant neoplasm of unspecified ovary: Secondary | ICD-10-CM | POA: Diagnosis not present

## 2020-12-06 DIAGNOSIS — G4733 Obstructive sleep apnea (adult) (pediatric): Secondary | ICD-10-CM | POA: Diagnosis not present

## 2020-12-06 DIAGNOSIS — E669 Obesity, unspecified: Secondary | ICD-10-CM | POA: Diagnosis not present

## 2020-12-06 DIAGNOSIS — Z23 Encounter for immunization: Secondary | ICD-10-CM | POA: Diagnosis not present

## 2020-12-06 DIAGNOSIS — K219 Gastro-esophageal reflux disease without esophagitis: Secondary | ICD-10-CM | POA: Diagnosis not present

## 2020-12-06 DIAGNOSIS — F325 Major depressive disorder, single episode, in full remission: Secondary | ICD-10-CM | POA: Diagnosis not present

## 2020-12-06 DIAGNOSIS — H9193 Unspecified hearing loss, bilateral: Secondary | ICD-10-CM | POA: Diagnosis not present

## 2020-12-06 DIAGNOSIS — Z0000 Encounter for general adult medical examination without abnormal findings: Secondary | ICD-10-CM | POA: Diagnosis not present

## 2020-12-11 ENCOUNTER — Other Ambulatory Visit (HOSPITAL_COMMUNITY): Payer: Self-pay

## 2020-12-11 ENCOUNTER — Encounter: Payer: Self-pay | Admitting: Hematology and Oncology

## 2020-12-11 ENCOUNTER — Other Ambulatory Visit: Payer: Self-pay | Admitting: Hematology and Oncology

## 2020-12-11 MED ORDER — MYRBETRIQ 50 MG PO TB24
ORAL_TABLET | ORAL | 3 refills | Status: DC
  Filled 2020-12-11: qty 90, 90d supply, fill #0
  Filled 2021-03-12: qty 90, 90d supply, fill #1
  Filled 2021-06-29: qty 30, 30d supply, fill #2
  Filled 2021-08-02: qty 30, 30d supply, fill #3
  Filled 2021-09-10: qty 90, 90d supply, fill #4
  Filled 2021-12-03: qty 30, 30d supply, fill #5

## 2020-12-11 MED FILL — Escitalopram Oxalate Tab 20 MG (Base Equiv): ORAL | 90 days supply | Qty: 90 | Fill #1 | Status: AC

## 2020-12-12 ENCOUNTER — Other Ambulatory Visit (HOSPITAL_COMMUNITY): Payer: Self-pay

## 2020-12-12 ENCOUNTER — Ambulatory Visit (INDEPENDENT_AMBULATORY_CARE_PROVIDER_SITE_OTHER): Payer: 59 | Admitting: Family Medicine

## 2020-12-12 ENCOUNTER — Other Ambulatory Visit: Payer: Self-pay

## 2020-12-12 ENCOUNTER — Encounter (INDEPENDENT_AMBULATORY_CARE_PROVIDER_SITE_OTHER): Payer: Self-pay | Admitting: Family Medicine

## 2020-12-12 VITALS — BP 109/70 | HR 73 | Temp 97.6°F | Ht 66.0 in | Wt 187.0 lb

## 2020-12-12 DIAGNOSIS — F3289 Other specified depressive episodes: Secondary | ICD-10-CM

## 2020-12-12 DIAGNOSIS — Z9189 Other specified personal risk factors, not elsewhere classified: Secondary | ICD-10-CM | POA: Diagnosis not present

## 2020-12-12 DIAGNOSIS — E8881 Metabolic syndrome: Secondary | ICD-10-CM

## 2020-12-12 DIAGNOSIS — E669 Obesity, unspecified: Secondary | ICD-10-CM | POA: Diagnosis not present

## 2020-12-12 DIAGNOSIS — E88819 Insulin resistance, unspecified: Secondary | ICD-10-CM

## 2020-12-12 DIAGNOSIS — Z23 Encounter for immunization: Secondary | ICD-10-CM | POA: Diagnosis not present

## 2020-12-12 DIAGNOSIS — E66811 Obesity, class 1: Secondary | ICD-10-CM

## 2020-12-12 DIAGNOSIS — Z6833 Body mass index (BMI) 33.0-33.9, adult: Secondary | ICD-10-CM | POA: Diagnosis not present

## 2020-12-12 MED ORDER — METFORMIN HCL 500 MG PO TABS
500.0000 mg | ORAL_TABLET | Freq: Two times a day (BID) | ORAL | 0 refills | Status: DC
  Filled 2020-12-12: qty 60, 30d supply, fill #0

## 2020-12-12 MED ORDER — BUPROPION HCL ER (SR) 200 MG PO TB12
200.0000 mg | ORAL_TABLET | Freq: Two times a day (BID) | ORAL | 0 refills | Status: DC
  Filled 2020-12-12: qty 60, 30d supply, fill #0

## 2020-12-13 ENCOUNTER — Other Ambulatory Visit (HOSPITAL_COMMUNITY): Payer: Self-pay

## 2020-12-13 DIAGNOSIS — G4733 Obstructive sleep apnea (adult) (pediatric): Secondary | ICD-10-CM | POA: Diagnosis not present

## 2020-12-13 NOTE — Progress Notes (Signed)
Chief Complaint:   OBESITY [name] is here to discuss her progress with her obesity treatment plan along with follow-up of her obesity related diagnoses. [name] is on the Category 2 Plan and states she is following her eating plan approximately 75% of the time. [name] states she is not currently exercising.  Today's visit was #: 62 Starting weight: 209 lbs Starting date: 04/29/2018 Today's weight: 187 lbs Today's date: 12/12/2020 Total lbs lost to date: 22 Total lbs lost since last in-office visit: +3  Interim History: [name] gets IV decadron every 2 weeks prior to taxol. This increases her hunger for 2-3 days after.  Assessment/Plan:  No orders of the defined types were placed in this encounter.   Medications Discontinued During This Encounter  Medication Reason   mirabegron ER (MYRBETRIQ) 50 MG TB24 tablet Error   buPROPion (WELLBUTRIN SR) 200 MG 12 hr tablet Reorder   metFORMIN (GLUCOPHAGE) 500 MG tablet Reorder     Meds ordered this encounter  Medications   buPROPion (WELLBUTRIN SR) 200 MG 12 hr tablet    Sig: Take 1 tablet by mouth 2 times daily.    Dispense:  60 tablet    Refill:  0    30 d supply; ov for RF   metFORMIN (GLUCOPHAGE) 500 MG tablet    Sig: Take 1 tablet by mouth 2 times daily with a meal.    Dispense:  60 tablet    Refill:  0    30 d supply; ov for RF     1. Insulin resistance [name] is tolerating medication(s) well without side effects.  Medication compliance is good and patient appears to be taking it as prescribed.  Denies additional concerns regarding this condition.  At goal. Goal is HgbA1c < 5.7, fasting insulin closer to 5.  Medication: Metformin.    Plan: Refill Metformin 500 mg. We will consider increasing dose post decadron treatment in the future as discussed with pt today.  She will continue to focus on protein-rich, low simple carbohydrate foods. We reviewed the importance of hydration, regular exercise for stress reduction, and  restorative sleep.   Lab Results  Component Value Date   HGBA1C 5.2 11/30/2019   Lab Results  Component Value Date   INSULIN 23.8 11/30/2019   INSULIN 16.5 05/31/2019   INSULIN 17.6 11/19/2018   INSULIN 25.8 (H) 04/29/2018   Refill- metFORMIN (GLUCOPHAGE) 500 MG tablet; Take 1 tablet by mouth 2 times daily with a meal.  Dispense: 60 tablet; Refill: 0  2. Other depression, with emotional eating [name] is tolerating medication(s) well without side effects.  Medication compliance is good and patient appears to be taking it as prescribed.  Denies additional concerns regarding this condition.  Stable. Medication: Wellbutrin.  Plan:  Stable mood. Pt denies need for change in dose. Wellbutrin is working well.  - buPROPion (WELLBUTRIN SR) 200 MG 12 hr tablet; Take 1 tablet by mouth 2 times daily.  Dispense: 60 tablet; Refill: 0  3. At risk for impaired metabolic function Due to [name]'s current state of health and medical condition(s), she is at a significantly higher risk for impaired metabolic function.   At least 9 minutes was spent on counseling [name] about these concerns today.  This places the patient at a much greater risk to subsequently develop cardio-pulmonary conditions that can negatively affect the patient's quality of life.  I stressed the importance of reversing these risks factors.  The initial goal is to lose  at least 5-10% of starting weight to help reduce risk factors.  Counseling:  Intensive lifestyle modifications discussed with [name] as the most appropriate first line treatment.  she will continue to work on diet, exercise, and weight loss efforts.  We will continue to reassess these conditions on a fairly regular basis in an attempt to decrease the patient's overall morbidity and mortality.  4. Obesity with current BMI of 30.2  [name] is currently in the action stage of change. As such, her goal is to continue with weight loss efforts. She has agreed to the Category 2  Plan.   Pt will decrease dose of decadron the next round and increase protein and water amounts on those days. If this does not help, we will consider GLP-1 (Victoza or Rybelsus) or Phentermine in the future for polyphagia.  Exercise goals:  As is  Behavioral modification strategies: increasing lean protein intake, decreasing simple carbohydrates, meal planning and cooking strategies, and planning for success.  [name] has agreed to follow-up with our clinic in 3 weeks. She was informed of the importance of frequent follow-up visits to maximize her success with intensive lifestyle modifications for her multiple health conditions.   Objective:   Blood pressure 109/70, pulse 73, temperature 97.6 F (36.4 C), height '5\' 6"'$  (1.676 m), weight 187 lb (84.8 kg), SpO2 98 %. Body mass index is 30.18 kg/m.  General: Cooperative, alert, well developed, in no acute distress. HEENT: Conjunctivae and lids unremarkable. Cardiovascular: Regular rhythm.  Lungs: Normal work of breathing. Neurologic: No focal deficits.   Lab Results  Component Value Date   CREATININE 1.26 (H) 12/04/2020   BUN 26 (H) 12/04/2020   NA 140 12/04/2020   K 4.2 12/04/2020   CL 101 12/04/2020   CO2 27 12/04/2020   Lab Results  Component Value Date   ALT 19 12/04/2020   AST 18 12/04/2020   ALKPHOS 62 12/04/2020   BILITOT 0.6 12/04/2020   Lab Results  Component Value Date   HGBA1C 5.2 11/30/2019   HGBA1C 5.3 05/31/2019   HGBA1C 5.2 11/19/2018   HGBA1C 5.5 04/29/2018   Lab Results  Component Value Date   INSULIN 23.8 11/30/2019   INSULIN 16.5 05/31/2019   INSULIN 17.6 11/19/2018   INSULIN 25.8 (H) 04/29/2018   Lab Results  Component Value Date   TSH 0.707 11/30/2019   Lab Results  Component Value Date   CHOL 224 (H) 11/30/2019   HDL 52 11/30/2019   LDLCALC 136 (H) 11/30/2019   TRIG 201 (H) 11/30/2019   Lab Results  Component Value Date   VD25OH 59.0 11/30/2019   VD25OH 54.0 05/31/2019   VD25OH 42.8  11/19/2018   Lab Results  Component Value Date   WBC 5.6 12/04/2020   HGB 13.6 12/04/2020   HCT 39.8 12/04/2020   MCV 92.8 12/04/2020   PLT 315 12/04/2020    Attestation Statements:   Reviewed by clinician on day of visit: allergies, medications, problem list, medical history, surgical history, family history, social history, and previous encounter notes.  Coral Ceo, CMA, am acting as transcriptionist for [employer], DO.  I have reviewed the above documentation for accuracy and completeness, and I agree with the above. Marjory Sneddon, D.O.  The [place] was signed into law in 2016 which includes the topic of electronic health records.  This provides immediate access to information in MyChart.  This includes consultation notes, operative notes, office notes, lab results and pathology reports.  If you have any questions about what you read please let us know at your next visit so we can discuss your concerns and take corrective action if need be.  We are right here with you.

## 2020-12-18 ENCOUNTER — Inpatient Hospital Stay (HOSPITAL_BASED_OUTPATIENT_CLINIC_OR_DEPARTMENT_OTHER): Payer: 59 | Admitting: Hematology and Oncology

## 2020-12-18 ENCOUNTER — Inpatient Hospital Stay: Payer: 59

## 2020-12-18 ENCOUNTER — Other Ambulatory Visit: Payer: Self-pay | Admitting: Hematology and Oncology

## 2020-12-18 ENCOUNTER — Other Ambulatory Visit: Payer: Self-pay

## 2020-12-18 ENCOUNTER — Encounter: Payer: Self-pay | Admitting: Hematology and Oncology

## 2020-12-18 DIAGNOSIS — N183 Chronic kidney disease, stage 3 unspecified: Secondary | ICD-10-CM

## 2020-12-18 DIAGNOSIS — Z7189 Other specified counseling: Secondary | ICD-10-CM

## 2020-12-18 DIAGNOSIS — C569 Malignant neoplasm of unspecified ovary: Secondary | ICD-10-CM

## 2020-12-18 DIAGNOSIS — Z5111 Encounter for antineoplastic chemotherapy: Secondary | ICD-10-CM | POA: Diagnosis not present

## 2020-12-18 DIAGNOSIS — R635 Abnormal weight gain: Secondary | ICD-10-CM | POA: Diagnosis not present

## 2020-12-18 DIAGNOSIS — R110 Nausea: Secondary | ICD-10-CM | POA: Diagnosis not present

## 2020-12-18 LAB — CMP (CANCER CENTER ONLY)
ALT: 17 U/L (ref 0–44)
AST: 17 U/L (ref 15–41)
Albumin: 3.7 g/dL (ref 3.5–5.0)
Alkaline Phosphatase: 63 U/L (ref 38–126)
Anion gap: 11 (ref 5–15)
BUN: 20 mg/dL (ref 8–23)
CO2: 25 mmol/L (ref 22–32)
Calcium: 9.2 mg/dL (ref 8.9–10.3)
Chloride: 103 mmol/L (ref 98–111)
Creatinine: 1.16 mg/dL — ABNORMAL HIGH (ref 0.44–1.00)
GFR, Estimated: 53 mL/min — ABNORMAL LOW (ref >60)
Glucose, Bld: 87 mg/dL (ref 70–99)
Potassium: 4.8 mmol/L (ref 3.5–5.1)
Sodium: 139 mmol/L (ref 135–145)
Total Bilirubin: 0.4 mg/dL (ref 0.3–1.2)
Total Protein: 7 g/dL (ref 6.5–8.1)

## 2020-12-18 LAB — CBC WITH DIFFERENTIAL (CANCER CENTER ONLY)
Abs Immature Granulocytes: 0.01 10*3/uL (ref 0.00–0.07)
Basophils Absolute: 0 10*3/uL (ref 0.0–0.1)
Basophils Relative: 1 %
Eosinophils Absolute: 0.1 10*3/uL (ref 0.0–0.5)
Eosinophils Relative: 1 %
HCT: 37.7 % (ref 36.0–46.0)
Hemoglobin: 12.9 g/dL (ref 12.0–15.0)
Immature Granulocytes: 0 %
Lymphocytes Relative: 33 %
Lymphs Abs: 1.8 10*3/uL (ref 0.7–4.0)
MCH: 31.9 pg (ref 26.0–34.0)
MCHC: 34.2 g/dL (ref 30.0–36.0)
MCV: 93.1 fL (ref 80.0–100.0)
Monocytes Absolute: 0.5 10*3/uL (ref 0.1–1.0)
Monocytes Relative: 10 %
Neutro Abs: 3 10*3/uL (ref 1.7–7.7)
Neutrophils Relative %: 55 %
Platelet Count: 300 10*3/uL (ref 150–400)
RBC: 4.05 MIL/uL (ref 3.87–5.11)
RDW: 13.5 % (ref 11.5–15.5)
WBC Count: 5.5 10*3/uL (ref 4.0–10.5)
nRBC: 0 % (ref 0.0–0.2)

## 2020-12-18 NOTE — Assessment & Plan Note (Signed)
She had gained some weight recently I will adjust the dose of her treatment accordingly

## 2020-12-18 NOTE — Assessment & Plan Note (Signed)
Renal function is stable but she is prone to get dehydrated We discussed importance of oral fluid intake

## 2020-12-18 NOTE — Assessment & Plan Note (Signed)
She tolerated recent reduced dose Taxol well She will continue day 1 and day 8 treatment, rest day 15 I plan to repeat imaging study in October

## 2020-12-18 NOTE — Progress Notes (Signed)
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   All questions were answered. The patient knows to call the clinic with any problems, questions or concerns. The total time spent in the appointment was 20 minutes encounter with patients including review of chart and various tests results, discussions about plan of care and coordination of care plan   Heath Lark, MD 12/18/2020 11:55 AM  INTERVAL HISTORY: Please see below for problem oriented charting. she returns for treatment follow-up on Taxol for malignant recurrent granulosa cell tumor She tolerated last cycle of treatment well No recent nausea, vomiting or changes in bowel habits She had gained some weight but otherwise feeling good Denies abdominal bloating, or pain Denies peripheral neuropathy  REVIEW OF SYSTEMS:   Constitutional: Denies fevers, chills or abnormal weight loss Eyes: Denies blurriness of vision Ears, nose, mouth, throat, and face: Denies mucositis or sore throat Respiratory: Denies cough, dyspnea or wheezes Cardiovascular: Denies palpitation, chest discomfort or lower extremity swelling Gastrointestinal:  Denies nausea, heartburn or change in bowel habits Skin: Denies abnormal skin rashes Lymphatics: Denies new lymphadenopathy or easy bruising Neurological:Denies numbness, tingling or new  weaknesses Behavioral/Psych: Mood is stable, no new changes  All other systems were reviewed with the patient and are negative.  I have reviewed the past medical history, past surgical history, social history and family history with the patient and they are unchanged from previous note.  ALLERGIES:  is allergic to codeine, hydrocodone-acetaminophen, oxycodone, sodium thiosalicylate, thimerosal, tramadol hcl, ciprofloxacin, daypro [oxaprozin], levofloxacin, stadol [butorphanol tartrate], and sulfa antibiotics.  MEDICATIONS:  Current Outpatient Medications  Medication Sig Dispense Refill   acetaminophen (TYLENOL) 500 MG tablet Take 1,000 mg by mouth every 6 (six) hours as needed for moderate pain.     Biotin 5 MG TABS Take 5 mg by mouth daily.     buPROPion (WELLBUTRIN SR) 200 MG 12 hr tablet Take 1 tablet by mouth 2 times daily. 60 tablet 0   Ca Phosphate-Cholecalciferol (EQL CALCIUM GUMMIES) 250-400 MG-UNIT CHEW Chew 1 tablet by mouth 2 (two) times daily.     escitalopram (LEXAPRO) 20 MG tablet TAKE 1 TABLET BY MOUTH ONCE A DAY (Patient taking differently: Take 20 mg by mouth daily. Taking 10 mg daily, temporarily due to LFT'S.) 90 tablet 3   lidocaine-prilocaine (EMLA) cream Apply to affected area once (Patient taking differently: Apply 1 application topically daily as needed (port access).) 30 g 3   LORazepam (ATIVAN) 0.5 MG tablet Take 1 tablet (0.5 mg total) by mouth 2 (two) times daily as needed for anxiety. 30 tablet 0   metFORMIN (GLUCOPHAGE) 500 MG tablet Take 1 tablet by mouth 2 times daily with a meal. 60 tablet 0   mirabegron ER (MYRBETRIQ) 50 MG TB24 tablet Take 1 tablet by mouth once daily 90 tablet 3   omeprazole (PRILOSEC) 20 MG capsule Take 1 capsule by mouth once  daily 90 capsule 3   ondansetron (ZOFRAN) 8 MG tablet Take 1 tablet (8 mg total) by mouth every 8 (eight) hours as needed for nausea or vomiting. 60 tablet 0   Polyethyl Glycol-Propyl Glycol (SYSTANE OP) Place 1 drop  into both eyes daily as needed (dry eyes).     Probiotic Product (ALIGN PO) Take 1 tablet by mouth daily.      prochlorperazine (COMPAZINE) 10 MG tablet Take 10 mg by mouth every 6 (six) hours as needed for nausea or vomiting.     No current facility-administered medications for this visit.    SUMMARY OF ONCOLOGIC HISTORY: Oncology History Overview Note  2018 - Core biopsy for ER/PR and Foundation One testing performed. Unfortunately, no sufficient tissue for Foundation One testing. ER was 50% and PR 90%. 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     - 09/2000 Chemotherapy   6 cycles of IP cisplatin and etoposide    02/2009 Relapse/Recurrence   CT showed increased size of nodules in pelvis   2010 Surgery   Exlap with section of tumor nodules near cecum and left pelvic sidewall. Tumor: ER negative, PR positive    Treatment Plan Change   Alternated 2 week courses of Megace and Tamoxifen - ended 03/2012   03/2012 PET scan   CT - progressive disease   2013 Treatment Plan Change   Letrozole   01/2016 Imaging   MRI showed progressive disease   2017 Treatment Plan Change   Two weeks of alternating Tamoxifen 20mg  daily and then Megace 40mg  TID   08/2016 Imaging   MRI showed progressive disease with peritoneal implants near liver, in pelvis   01/2017  Treatment Plan Change   Lupron 11.25 q 3 months   11/2017 Imaging   Overall mixed response.   Mixed cystic/solid lesions in the left pelvis are mildly improved.   Cystic peritoneal disease, including the dominant lesion along the posterior right hepatic lobe, is mildly progressed.   Subcapsular lesion along the posterior right hepatic lobe is unchanged.   03/2018 Imaging   MRI A/P: Mixed response of individual peritoneal metastases in the pelvis, as described above. Overall, there has been no significant change in bulk of disease.   Stable cystic peritoneal metastatic disease along the capsular surfaces of the liver and spleen.   No new sites of metastatic disease identified within the abdomen or pelvis.   08/2018 Imaging   MRI A/P: Status post hysterectomy and bilateral salpingo-oophorectomy.   Mixed cystic/solid peritoneal implants in the abdomen/pelvis, as above. Dominant cystic implant along the posterior liver surface is mildly increased. Remaining lesions are overall grossly unchanged.   No new lesions are identified.   01/28/2019 Imaging   Mri A/P: 1. Relatively similar appearance of peritoneal metastasis. A posterior right hepatic capsular based lesion is similar to minimally decreased in size. Left pelvic implants are primarily similar with possible enlargement of an anterior high left pelvic cystic implant. No new disease  identified. 2.  Aortic Atherosclerosis (ICD10-I70.0). 3. Hepatic steatosis. 4. Left adrenal adenoma.   06/02/2019 Imaging   MRI 1. Potential slight enlargement of dominant cystic area and solid component, associated with rind like signal variation on T2 along the inferior right hepatic margin, also potentially slightly increased. Findings may still be within the realm of measurement and technical variation. Close attention on follow-up. 2. Subtle cystic changes along the cephalad margin of the spleen are difficult to see on previous imaging, perhaps new  compared with prior imaging studies. 3. Pelvic implants and left lower quadrant lesion with similar size, of the area along the left iliac vasculature may be slightly larger than on the prior study. 4. Signs of extensive retroperitoneal and pelvic lymphadenectomy. 5. Hepatic steatosis. 6. 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     06/08/2019 Cancer Staging   Staging form: [place], AJCC 7th Edition - Clinical: Stage IIIC (rT2, N1, M0) - Signed by Heath Lark, MD on 06/08/2019   06/10/2019 Imaging   1. 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 These findings are slightly worsened in comparison to a remote prior CT examination dated 10/04/2014.   2.  No evidence of metastatic disease in the chest.   3. Status post hysterectomy, pelvic and retroperitoneal lymph node dissection, and ventral hernia mesh repair.   4.  Hepatic steatosis.   5.  Aortic Atherosclerosis (ICD10-I70.0).   06/24/2019 - 09/02/2019 Chemotherapy   The patient had carboplatin for chemotherapy treatment.     09/02/2019 Tumor Marker   Patient's tumor was tested for the following markers: Inhibin B Results of the tumor marker test revealed 94   09/23/2019 Imaging   1. Interval progression of the soft tissue lesions in the anterior left pelvis, likely peritoneal implants, compatible with disease progression. Remaining sites of apparent disease along the liver capsule and posterior spleen are stable 2. Stable 2 cm left adrenal adenoma. 3. Hepatic steatosis. 4. Right-side predominant colonic diverticulosis without diverticulitis. 5. Aortic Atherosclerosis (ICD10-I70.0).   12/23/2019 Imaging   1. Slight interval increase in size of a mixed solid and cystic nodule in the left hemipelvis measuring 3.0 x 2.9  cm, previously 2.7 x 2.1 cm. 2. Interval decrease in size of a nodule in the left paracolic gutter measuring 1.0 x 0.8 cm, previously 2.1 x 1.6 cm. 3. Stable peritoneal nodules in the hepatorenal recess and at the inferior tip of the spleen. 4. Unchanged nodule or lymph node overlying the left external iliac artery. 5. Enlargement of dominant left pelvic nodule is concerning for disease progression despite interval decrease in size of a nodule in the left paracolic gutter and stability of other nodules. 6. No evidence of metastatic disease in the chest. 7. Stable, benign left adrenal adenoma. 8. Ventral hernia mesh repair with a small component of recurrent hernia inferiorly, containing a single nonobstructed loop of small bowel. 9. Hepatic steatosis. 10. Aortic Atherosclerosis (ICD10-I70.0).   12/23/2019 Tumor Marker   Patient's tumor was tested for the following markers: Inhibin B Results of the tumor marker test revealed 94.9   01/25/2020 Tumor Marker   Patient's tumor was tested for the following markers: Inhibin B Results of the tumor marker test revealed 116.7   02/20/2020 Genetic Testing   Negative genetic testing: no pathogenic variants detected in  [facility] Multi-Cancer Panel.  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  The report date is February 20, 2020.   The Multi-Cancer Panel offered by [facility] includes sequencing and/or deletion duplication testing of the following 85 genes: AIP, ALK, APC, ATM, AXIN2,BAP1,  BARD1, BLM, BMPR1A, BRCA1, BRCA2, BRIP1, CASR, CDC73, CDH1, CDK4, CDKN1B, CDKN1C, CDKN2A (p14ARF), CDKN2A (p16INK4a), CEBPA, CHEK2, CTNNA1, DICER1, DIS3L2, EGFR (c.2369C>T, p.Thr790Met variant only), EPCAM (Deletion/duplication testing only), FH, FLCN, GATA2, GPC3, GREM1 (Promoter region deletion/duplication testing only), HOXB13 (c.251G>A, p.Gly84Glu), HRAS, KIT, MAX, MEN1, MET, MITF (c.952G>A, p.Glu318Lys variant only), MLH1, MSH2, MSH3, MSH6, MUTYH,  NBN, NF1, NF2, NTHL1, PALB2, PDGFRA, PHOX2B, PMS2, POLD1, POLE, POT1, PRKAR1A, PTCH1, PTEN, RAD50, RAD51C, RAD51D, RB1, RECQL4, RET, RNF43, RUNX1, SDHAF2, SDHA (sequence changes only), SDHB, SDHC, SDHD, SMAD4, SMARCA4, SMARCB1, SMARCE1, STK11, SUFU, TERC, TERT, TMEM127, TP53, TSC1, TSC2, VHL, WRN and WT1.    03/02/2020 Tumor Marker   Patient's tumor was tested for the following markers: Inhibin B Results of the tumor marker test revealed 80.8   04/17/2020 Imaging   1. Overall, exam is stable. Multiple peritoneal nodules are again seen. The index nodule in the left lower quadrant is mildly increased in size in the interval. The lesion within the anterior left pelvis has decreased in size in the interval. There has also been decrease in size of cystic lesion within the a hepatorenal recess. The remaining peritoneal lesions are unchanged. No new lesions identified. 2. Stable left adrenal nodule. 3.  Aortic Atherosclerosis (ICD10-I70.0).     08/21/2020 Imaging   Bone density is normal AP spine T score 0.8 Femoral neck on the left, T score -0.2 Femoral neck on the right ,T score -0.4   10/17/2020 Imaging   1. Interval progression of peritoneal nodules along the left pelvic sidewall, concerning for progressive metastatic disease. 2. Small capsular lesion medial right liver and the apparent cystic lesion superior to the right kidney are similar to prior. 3. Tiny soft tissue nodule anterior aspect of the lateral left pelvis described as decreasing on the prior study has decreased further on today's exam. 4. Stable left adrenal nodule. This cannot be definitively characterized. 5. Tiny nonobstructing stone lower pole right kidney. 6. Aortic Atherosclerosis (ICD10-I70.0).   10/26/2020 Procedure   Successful placement of a right internal jugular approach power injectable Port-A-Cath. The catheter is ready for immediate use.       10/31/2020 -  Chemotherapy    Patient is on Treatment Plan: OVARIAN  PACLITAXEL I3,2,54,98 Q28D         PHYSICAL EXAMINATION: ECOG PERFORMANCE STATUS: 0 - Asymptomatic  Vitals:   12/18/20 1144  BP: 136/74  Pulse: 64  Resp: 18  Temp: 98.1 F (36.7 C)  SpO2: 99%   Filed Weights   12/18/20 1144  Weight: 192 lb 11.2 oz (87.4 kg)    GENERAL:alert, no distress and comfortable SKIN: skin color, texture, turgor are normal, no rashes or significant lesions.  Noted alopecia EYES: normal, Conjunctiva are pink and non-injected, sclera clear OROPHARYNX:no exudate, no erythema and lips, buccal mucosa, and tongue normal  NECK: supple, thyroid normal size, non-tender, without nodularity LYMPH:  no palpable lymphadenopathy in the cervical, axillary or inguinal LUNGS: clear to auscultation and percussion with normal breathing effort HEART: regular rate & rhythm and no murmurs and no lower extremity edema ABDOMEN:abdomen soft, non-tender and normal bowel sounds.  Palpable soft tissue thickness beneath surgical scar Musculoskeletal:no cyanosis of digits and no clubbing  NEURO: alert & oriented x 3  with fluent speech, no focal motor/sensory deficits  LABORATORY DATA:  I have reviewed the data as listed    Component Value Date/Time   NA 139 12/18/2020 1047   NA 139 11/30/2019 0913   NA 141 09/24/2013 1131   K 4.8 12/18/2020 1047   K 4.5 09/24/2013 1131   CL 103 12/18/2020 1047   CO2 25 12/18/2020 1047   CO2 24 09/24/2013 1131   GLUCOSE 87 12/18/2020 1047   GLUCOSE 90 09/24/2013 1131   BUN 20 12/18/2020 1047   BUN 32 (H) 11/30/2019 0913   BUN 21.2 09/24/2013 1131   CREATININE 1.16 (H) 12/18/2020 1047   CREATININE 1.1 09/24/2013 1131   CALCIUM 9.2 12/18/2020 1047   CALCIUM 9.5 09/24/2013 1131   PROT 7.0 12/18/2020 1047   PROT 7.0 11/30/2019 0913   PROT 6.8 09/24/2013 1131   ALBUMIN 3.7 12/18/2020 1047   ALBUMIN 4.3 11/30/2019 0913   ALBUMIN 3.9 09/24/2013 1131   AST 17 12/18/2020 1047   AST 23 09/24/2013 1131   ALT 17 12/18/2020 1047   ALT 27  09/24/2013 1131   ALKPHOS 63 12/18/2020 1047   ALKPHOS 81 09/24/2013 1131   BILITOT 0.4 12/18/2020 1047   BILITOT 0.53 09/24/2013 1131   GFRNONAA 53 (L) 12/18/2020 1047   GFRAA 55 (L) 01/24/2020 1530   GFRAA 57 (L) 12/21/2019 1259    No results found for: SPEP, UPEP  Lab Results  Component Value Date   WBC 5.5 12/18/2020   NEUTROABS 3.0 12/18/2020   HGB 12.9 12/18/2020   HCT 37.7 12/18/2020   MCV 93.1 12/18/2020   PLT 300 12/18/2020      Chemistry      Component Value Date/Time   NA 139 12/18/2020 1047   NA 139 11/30/2019 0913   NA 141 09/24/2013 1131   K 4.8 12/18/2020 1047   K 4.5 09/24/2013 1131   CL 103 12/18/2020 1047   CO2 25 12/18/2020 1047   CO2 24 09/24/2013 1131   BUN 20 12/18/2020 1047   BUN 32 (H) 11/30/2019 0913   BUN 21.2 09/24/2013 1131   CREATININE 1.16 (H) 12/18/2020 1047   CREATININE 1.1 09/24/2013 1131      Component Value Date/Time   CALCIUM 9.2 12/18/2020 1047   CALCIUM 9.5 09/24/2013 1131   ALKPHOS 63 12/18/2020 1047   ALKPHOS 81 09/24/2013 1131   AST 17 12/18/2020 1047   AST 23 09/24/2013 1131   ALT 17 12/18/2020 1047   ALT 27 09/24/2013 1131   BILITOT 0.4 12/18/2020 1047   BILITOT 0.53 09/24/2013 1131

## 2020-12-19 ENCOUNTER — Inpatient Hospital Stay: Payer: 59

## 2020-12-19 VITALS — BP 136/70 | HR 63 | Temp 97.6°F | Resp 16

## 2020-12-19 DIAGNOSIS — Z5111 Encounter for antineoplastic chemotherapy: Secondary | ICD-10-CM | POA: Diagnosis not present

## 2020-12-19 DIAGNOSIS — Z7189 Other specified counseling: Secondary | ICD-10-CM

## 2020-12-19 DIAGNOSIS — C569 Malignant neoplasm of unspecified ovary: Secondary | ICD-10-CM

## 2020-12-19 DIAGNOSIS — N183 Chronic kidney disease, stage 3 unspecified: Secondary | ICD-10-CM | POA: Diagnosis not present

## 2020-12-19 DIAGNOSIS — R110 Nausea: Secondary | ICD-10-CM | POA: Diagnosis not present

## 2020-12-19 MED ORDER — HEPARIN SOD (PORK) LOCK FLUSH 100 UNIT/ML IV SOLN
500.0000 [IU] | Freq: Once | INTRAVENOUS | Status: AC | PRN
  Administered 2020-12-19: 500 [IU]

## 2020-12-19 MED ORDER — SODIUM CHLORIDE 0.9 % IV SOLN
60.0000 mg/m2 | Freq: Once | INTRAVENOUS | Status: AC
  Administered 2020-12-19: 120 mg via INTRAVENOUS
  Filled 2020-12-19: qty 20

## 2020-12-19 MED ORDER — SODIUM CHLORIDE 0.9% FLUSH
10.0000 mL | INTRAVENOUS | Status: DC | PRN
  Administered 2020-12-19: 10 mL

## 2020-12-19 MED ORDER — SODIUM CHLORIDE 0.9 % IV SOLN: Freq: Once | INTRAVENOUS | Status: AC

## 2020-12-19 MED ORDER — DIPHENHYDRAMINE HCL 50 MG/ML IJ SOLN
25.0000 mg | Freq: Once | INTRAMUSCULAR | Status: AC
  Administered 2020-12-19: 25 mg via INTRAVENOUS
  Filled 2020-12-19: qty 1

## 2020-12-19 MED ORDER — DEXAMETHASONE SODIUM PHOSPHATE 10 MG/ML IJ SOLN
5.0000 mg | Freq: Once | INTRAMUSCULAR | Status: AC
  Administered 2020-12-19: 5 mg via INTRAVENOUS
  Filled 2020-12-19: qty 1

## 2020-12-19 MED ORDER — FAMOTIDINE 20 MG IN NS 100 ML IVPB
20.0000 mg | Freq: Once | INTRAVENOUS | Status: AC
  Administered 2020-12-19: 20 mg via INTRAVENOUS
  Filled 2020-12-19: qty 100

## 2020-12-19 MED ORDER — SODIUM CHLORIDE 0.9 % IV SOLN: 5.0000 mg | Freq: Once | INTRAVENOUS | Status: DC

## 2020-12-19 NOTE — Patient Instructions (Signed)
[facility]   Discharge Instructions: Thank you for choosing [facility] to provide your oncology and hematology care.   If you have a lab appointment with the Cancer Center, please go directly to the Cancer Center and check in at the registration area.   Wear comfortable clothing and clothing appropriate for easy access to any Portacath or PICC line.   We strive to give you quality time with your provider. You may need to reschedule your appointment if you arrive late (15 or more minutes).  Arriving late affects you and other patients whose appointments are after yours.  Also, if you miss three or more appointments without notifying the office, you may be dismissed from the clinic at the provider's discretion.      For prescription refill requests, have your pharmacy contact our office and allow 72 hours for refills to be completed.    Today you received the following chemotherapy and/or immunotherapy agents: paclitaxel.      To help prevent nausea and vomiting after your treatment, we encourage you to take your nausea medication as directed.  BELOW ARE SYMPTOMS THAT SHOULD BE REPORTED IMMEDIATELY: *FEVER GREATER THAN 100.4 F (38 C) OR HIGHER *CHILLS OR SWEATING *NAUSEA AND VOMITING THAT IS NOT CONTROLLED WITH YOUR NAUSEA MEDICATION *UNUSUAL SHORTNESS OF BREATH *UNUSUAL BRUISING OR BLEEDING *URINARY PROBLEMS (pain or burning when urinating, or frequent urination) *BOWEL PROBLEMS (unusual diarrhea, constipation, pain near the anus) TENDERNESS IN MOUTH AND THROAT WITH OR WITHOUT PRESENCE OF ULCERS (sore throat, sores in mouth, or a toothache) UNUSUAL RASH, SWELLING OR PAIN  UNUSUAL VAGINAL DISCHARGE OR ITCHING   Items with * indicate a potential emergency and should be followed up as soon as possible or go to the Emergency Department if any problems should occur.  Please show the CHEMOTHERAPY ALERT CARD or IMMUNOTHERAPY ALERT CARD at check-in  to the Emergency Department and triage nurse.  Should you have questions after your visit or need to cancel or reschedule your appointment, please contact [facility]  Dept: [phone]  and follow the prompts.  Office hours are 8:00 a.m. to 4:30 p.m. Monday - Friday. Please note that voicemails left after 4:00 p.m. may not be returned until the following business day.  We are closed weekends and major holidays. You have access to a nurse at all times for urgent questions. Please call the main number to the clinic Dept: [phone] and follow the prompts.   For any non-urgent questions, you may also contact your provider using MyChart. We now offer e-Visits for anyone 18 and older to request care online for non-urgent symptoms. For details visit [email or web address].   Also download the MyChart app! Go to the app store, search "MyChart", open the app, select [facility], and log in with your MyChart username and password.  Due to Covid, a mask is required upon entering the hospital/clinic. If you do not have a mask, one will be given to you upon arrival. For doctor visits, patients may have 1 support person aged 18 or older with them. For treatment visits, patients cannot have anyone with them due to current Covid guidelines and our immunocompromised population.   

## 2020-12-22 ENCOUNTER — Inpatient Hospital Stay: Payer: 59 | Attending: Hematology

## 2020-12-22 ENCOUNTER — Ambulatory Visit: Payer: 59

## 2020-12-22 DIAGNOSIS — Z23 Encounter for immunization: Secondary | ICD-10-CM | POA: Insufficient documentation

## 2020-12-22 DIAGNOSIS — Z5111 Encounter for antineoplastic chemotherapy: Secondary | ICD-10-CM | POA: Insufficient documentation

## 2020-12-22 DIAGNOSIS — N183 Chronic kidney disease, stage 3 unspecified: Secondary | ICD-10-CM | POA: Insufficient documentation

## 2020-12-22 DIAGNOSIS — R21 Rash and other nonspecific skin eruption: Secondary | ICD-10-CM | POA: Insufficient documentation

## 2020-12-22 DIAGNOSIS — C569 Malignant neoplasm of unspecified ovary: Secondary | ICD-10-CM | POA: Insufficient documentation

## 2020-12-24 ENCOUNTER — Encounter: Payer: Self-pay | Admitting: Hematology and Oncology

## 2020-12-26 ENCOUNTER — Inpatient Hospital Stay: Payer: 59

## 2020-12-26 ENCOUNTER — Inpatient Hospital Stay (HOSPITAL_BASED_OUTPATIENT_CLINIC_OR_DEPARTMENT_OTHER): Payer: 59 | Admitting: Hematology and Oncology

## 2020-12-26 ENCOUNTER — Other Ambulatory Visit: Payer: Self-pay

## 2020-12-26 VITALS — BP 128/76 | HR 61 | Temp 98.0°F | Resp 16

## 2020-12-26 DIAGNOSIS — Z5111 Encounter for antineoplastic chemotherapy: Secondary | ICD-10-CM | POA: Diagnosis not present

## 2020-12-26 DIAGNOSIS — C569 Malignant neoplasm of unspecified ovary: Secondary | ICD-10-CM

## 2020-12-26 DIAGNOSIS — Z7189 Other specified counseling: Secondary | ICD-10-CM

## 2020-12-26 DIAGNOSIS — Z23 Encounter for immunization: Secondary | ICD-10-CM | POA: Diagnosis not present

## 2020-12-26 DIAGNOSIS — R21 Rash and other nonspecific skin eruption: Secondary | ICD-10-CM | POA: Diagnosis not present

## 2020-12-26 DIAGNOSIS — N183 Chronic kidney disease, stage 3 unspecified: Secondary | ICD-10-CM | POA: Diagnosis not present

## 2020-12-26 LAB — CBC WITH DIFFERENTIAL (CANCER CENTER ONLY)
Abs Immature Granulocytes: 0.02 10*3/uL (ref 0.00–0.07)
Basophils Absolute: 0.1 10*3/uL (ref 0.0–0.1)
Basophils Relative: 1 %
Eosinophils Absolute: 0.1 10*3/uL (ref 0.0–0.5)
Eosinophils Relative: 2 %
HCT: 40.1 % (ref 36.0–46.0)
Hemoglobin: 13.6 g/dL (ref 12.0–15.0)
Immature Granulocytes: 0 %
Lymphocytes Relative: 35 %
Lymphs Abs: 1.9 10*3/uL (ref 0.7–4.0)
MCH: 32 pg (ref 26.0–34.0)
MCHC: 33.9 g/dL (ref 30.0–36.0)
MCV: 94.4 fL (ref 80.0–100.0)
Monocytes Absolute: 0.3 10*3/uL (ref 0.1–1.0)
Monocytes Relative: 6 %
Neutro Abs: 3 10*3/uL (ref 1.7–7.7)
Neutrophils Relative %: 56 %
Platelet Count: 326 10*3/uL (ref 150–400)
RBC: 4.25 MIL/uL (ref 3.87–5.11)
RDW: 13.4 % (ref 11.5–15.5)
WBC Count: 5.4 10*3/uL (ref 4.0–10.5)
nRBC: 0 % (ref 0.0–0.2)

## 2020-12-26 LAB — CMP (CANCER CENTER ONLY)
ALT: 17 U/L (ref 0–44)
AST: 16 U/L (ref 15–41)
Albumin: 3.9 g/dL (ref 3.5–5.0)
Alkaline Phosphatase: 62 U/L (ref 38–126)
Anion gap: 11 (ref 5–15)
BUN: 24 mg/dL — ABNORMAL HIGH (ref 8–23)
CO2: 26 mmol/L (ref 22–32)
Calcium: 9.8 mg/dL (ref 8.9–10.3)
Chloride: 101 mmol/L (ref 98–111)
Creatinine: 1.23 mg/dL — ABNORMAL HIGH (ref 0.44–1.00)
GFR, Estimated: 49 mL/min — ABNORMAL LOW (ref >60)
Glucose, Bld: 84 mg/dL (ref 70–99)
Potassium: 4.5 mmol/L (ref 3.5–5.1)
Sodium: 138 mmol/L (ref 135–145)
Total Bilirubin: 0.5 mg/dL (ref 0.3–1.2)
Total Protein: 7.1 g/dL (ref 6.5–8.1)

## 2020-12-26 MED ORDER — SODIUM CHLORIDE 0.9 % IV SOLN: Freq: Once | INTRAVENOUS | Status: AC

## 2020-12-26 MED ORDER — SODIUM CHLORIDE 0.9 % IV SOLN
60.0000 mg/m2 | Freq: Once | INTRAVENOUS | Status: AC
  Administered 2020-12-26: 120 mg via INTRAVENOUS
  Filled 2020-12-26: qty 20

## 2020-12-26 MED ORDER — SODIUM CHLORIDE 0.9% FLUSH
10.0000 mL | INTRAVENOUS | Status: DC | PRN
  Administered 2020-12-26: 10 mL

## 2020-12-26 MED ORDER — SODIUM CHLORIDE 0.9 % IV SOLN
5.0000 mg | Freq: Once | INTRAVENOUS | Status: DC
  Filled 2020-12-26 (×2): qty 0.5

## 2020-12-26 MED ORDER — DIPHENHYDRAMINE HCL 50 MG/ML IJ SOLN
25.0000 mg | Freq: Once | INTRAMUSCULAR | Status: AC
  Administered 2020-12-26: 25 mg via INTRAVENOUS
  Filled 2020-12-26: qty 1

## 2020-12-26 MED ORDER — FAMOTIDINE 20 MG IN NS 100 ML IVPB
INTRAVENOUS | Status: AC
  Filled 2020-12-26: qty 100

## 2020-12-26 MED ORDER — HEPARIN SOD (PORK) LOCK FLUSH 100 UNIT/ML IV SOLN
500.0000 [IU] | Freq: Once | INTRAVENOUS | Status: AC | PRN
  Administered 2020-12-26: 500 [IU]

## 2020-12-26 MED ORDER — DEXAMETHASONE SODIUM PHOSPHATE 10 MG/ML IJ SOLN
INTRAMUSCULAR | Status: AC
  Administered 2020-12-26: 5 mg
  Filled 2020-12-26: qty 1

## 2020-12-26 MED ORDER — FAMOTIDINE 20 MG IN NS 100 ML IVPB
20.0000 mg | Freq: Once | INTRAVENOUS | Status: AC
  Administered 2020-12-26: 20 mg via INTRAVENOUS

## 2020-12-26 MED ORDER — DEXAMETHASONE SODIUM PHOSPHATE 10 MG/ML IJ SOLN: 5.0000 mg | Freq: Once | INTRAMUSCULAR | Status: DC

## 2020-12-26 NOTE — Progress Notes (Signed)
Okay to proceed with pre meds prior to labs per MD>

## 2020-12-26 NOTE — Patient Instructions (Signed)
[facility]   Discharge Instructions: Thank you for choosing [name] to provide your oncology and hematology care.   If you have a lab appointment with the [facility], please go directly to the [facility] and check in at the registration area.   Wear comfortable clothing and clothing appropriate for easy access to any Portacath or PICC line.   We strive to give you quality time with your provider. You may need to reschedule your appointment if you arrive late (15 or more minutes).  Arriving late affects you and other patients whose appointments are after yours.  Also, if you miss three or more appointments without notifying the office, you may be dismissed from the clinic at the provider's discretion.      For prescription refill requests, have your pharmacy contact our office and allow 72 hours for refills to be completed.    Today you received the following chemotherapy and/or immunotherapy agents: paclitaxel.      To help prevent nausea and vomiting after your treatment, we encourage you to take your nausea medication as directed.  BELOW ARE SYMPTOMS THAT SHOULD BE REPORTED IMMEDIATELY: *FEVER GREATER THAN 100.4 F (38 C) OR HIGHER *CHILLS OR SWEATING *NAUSEA AND VOMITING THAT IS NOT CONTROLLED WITH YOUR NAUSEA MEDICATION *UNUSUAL SHORTNESS OF BREATH *UNUSUAL BRUISING OR BLEEDING *URINARY PROBLEMS (pain or burning when urinating, or frequent urination) *BOWEL PROBLEMS (unusual diarrhea, constipation, pain near the anus) TENDERNESS IN MOUTH AND THROAT WITH OR WITHOUT PRESENCE OF ULCERS (sore throat, sores in mouth, or a toothache) UNUSUAL RASH, SWELLING OR PAIN  UNUSUAL VAGINAL DISCHARGE OR ITCHING   Items with * indicate a potential emergency and should be followed up as soon as possible or go to the Emergency Department if any problems should occur.  Please show the CHEMOTHERAPY ALERT CARD or IMMUNOTHERAPY ALERT CARD at check-in  to the Emergency Department and triage nurse.  Should you have questions after your visit or need to cancel or reschedule your appointment, please contact [facility]  Dept: [phone]  and follow the prompts.  Office hours are 8:00 a.m. to 4:30 p.m. Monday - Friday. Please note that voicemails left after 4:00 p.m. may not be returned until the following business day.  We are closed weekends and major holidays. You have access to a nurse at all times for urgent questions. Please call the main number to the clinic Dept: [phone] and follow the prompts.   For any non-urgent questions, you may also contact your provider using MyChart. We now offer e-Visits for anyone 18 and older to request care online for non-urgent symptoms. For details visit [email or web address].   Also download the MyChart app! Go to the app store, search "MyChart", open the app, select [facility], and log in with your MyChart username and password.  Due to Covid, a mask is required upon entering the hospital/clinic. If you do not have a mask, one will be given to you upon arrival. For doctor visits, patients may have 1 support person aged 18 or older with them. For treatment visits, patients cannot have anyone with them due to current Covid guidelines and our immunocompromised population.   

## 2020-12-27 ENCOUNTER — Encounter: Payer: Self-pay | Admitting: Hematology and Oncology

## 2020-12-27 DIAGNOSIS — R21 Rash and other nonspecific skin eruption: Secondary | ICD-10-CM | POA: Insufficient documentation

## 2020-12-27 NOTE — Progress Notes (Signed)
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   All questions were answered. The patient knows to call the clinic with any problems, questions or concerns. The total time spent in the appointment was 15 minutes encounter with patients including review of chart and various tests results, discussions about plan of care and coordination of care plan   Heath Lark, MD 12/27/2020 9:19 AM  INTERVAL HISTORY: Please see below for problem oriented charting. she is seen today because of rash near her right breast It started 2 days ago It is not itchy She denies insect bite No new medications She denies rash elsewhere.  The rash is not causing any pain or discomfort  REVIEW OF SYSTEMS:   Constitutional: Denies fevers, chills or abnormal weight loss Eyes: Denies blurriness of vision Ears, nose, mouth, throat, and face: Denies mucositis or sore throat Respiratory: Denies cough, dyspnea or wheezes Cardiovascular: Denies palpitation, chest discomfort or lower extremity swelling Gastrointestinal:  Denies nausea, heartburn or change in bowel habits Lymphatics: Denies new lymphadenopathy or easy bruising Neurological:Denies numbness, tingling or new weaknesses Behavioral/Psych: Mood is stable, no new changes  All other systems were reviewed with the patient and are negative.  I have reviewed the past medical history, past surgical history, social history and family history with the patient and they are unchanged from previous note.  ALLERGIES:  is allergic to  codeine, hydrocodone-acetaminophen, oxycodone, sodium thiosalicylate, thimerosal, tramadol hcl, ciprofloxacin, daypro [oxaprozin], levofloxacin, stadol [butorphanol tartrate], and sulfa antibiotics.  MEDICATIONS:  Current Outpatient Medications  Medication Sig Dispense Refill   acetaminophen (TYLENOL) 500 MG tablet Take 1,000 mg by mouth every 6 (six) hours as needed for moderate pain.     Biotin 5 MG TABS Take 5 mg by mouth daily.     buPROPion (WELLBUTRIN SR) 200 MG 12 hr tablet Take 1 tablet by mouth 2 times daily. 60 tablet 0   Ca Phosphate-Cholecalciferol (EQL CALCIUM GUMMIES) 250-400 MG-UNIT CHEW Chew 1 tablet by mouth 2 (two) times daily.     escitalopram (LEXAPRO) 20 MG tablet TAKE 1 TABLET BY MOUTH ONCE A DAY (Patient taking differently: Take 20 mg by mouth daily. Taking 10 mg daily, temporarily due to LFT'S.) 90 tablet 3   lidocaine-prilocaine (EMLA) cream Apply to affected area once (Patient taking differently: Apply 1 application topically daily as needed (port access).) 30 g 3   LORazepam (ATIVAN) 0.5 MG tablet Take 1 tablet (0.5 mg total) by mouth 2 (two) times daily as needed for anxiety. 30 tablet 0   metFORMIN (GLUCOPHAGE) 500 MG tablet Take 1 tablet by mouth 2 times daily with a meal. 60 tablet 0   mirabegron ER (MYRBETRIQ) 50 MG TB24 tablet Take 1 tablet by mouth once daily 90 tablet 3   omeprazole (PRILOSEC) 20 MG capsule Take 1 capsule by mouth once daily 90 capsule 3   ondansetron (ZOFRAN) 8 MG tablet Take 1 tablet (8 mg total) by mouth every 8 (eight) hours as needed for nausea or vomiting. 60 tablet 0   Polyethyl Glycol-Propyl Glycol (SYSTANE OP) Place  1 drop into both eyes daily as needed (dry eyes).     Probiotic Product (ALIGN PO) Take 1 tablet by mouth daily.      prochlorperazine (COMPAZINE) 10 MG tablet Take 10 mg by mouth every 6 (six) hours as needed for nausea or vomiting.     No current facility-administered medications for this visit.    SUMMARY OF ONCOLOGIC  HISTORY: Oncology History Overview Note  2018 - Core biopsy for ER/PR and Foundation One testing performed. Unfortunately, no sufficient tissue for Foundation One testing. ER was 50% and PR 90%. 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     - 09/2000 Chemotherapy   6 cycles of IP cisplatin and etoposide    02/2009 Relapse/Recurrence   CT showed increased size of nodules in pelvis   2010 Surgery   Exlap with section of tumor nodules near cecum and left pelvic sidewall. Tumor: ER negative, PR positive    Treatment Plan Change   Alternated 2 week courses of Megace and Tamoxifen - ended 03/2012   03/2012 PET scan   CT - progressive disease   2013 Treatment Plan Change   Letrozole   01/2016 Imaging   MRI showed progressive disease   2017 Treatment Plan Change   Two weeks of alternating Tamoxifen 68m daily and then Megace 436mTID   08/2016 Imaging   MRI showed progressive disease with peritoneal implants near liver, in pelvis   01/2017 Treatment Plan Change   Lupron 11.25 q 3 months   11/2017 Imaging   Overall mixed response.   Mixed cystic/solid lesions in the left pelvis are mildly improved.   Cystic peritoneal disease, including the dominant lesion along the posterior right hepatic lobe, is mildly progressed.   Subcapsular lesion along the  posterior right hepatic lobe is unchanged.   03/2018 Imaging   MRI A/P: Mixed response of individual peritoneal metastases in the pelvis, as described above. Overall, there has been no significant change in bulk of disease.   Stable cystic peritoneal metastatic disease along the capsular surfaces of the liver and spleen.   No new sites of metastatic disease identified within the abdomen or pelvis.   08/2018 Imaging   MRI A/P: Status post hysterectomy and bilateral salpingo-oophorectomy.   Mixed cystic/solid peritoneal implants in the abdomen/pelvis, as above. Dominant cystic implant along the posterior liver surface is mildly increased. Remaining lesions are overall grossly unchanged.   No new lesions are identified.   01/28/2019 Imaging   Mri A/P: 1. Relatively similar appearance of peritoneal metastasis. A posterior right hepatic capsular based lesion is similar to minimally decreased in size. Left pelvic implants are primarily similar with possible enlargement of an anterior high left pelvic cystic implant. No new disease identified. 2.  Aortic Atherosclerosis (ICD10-I70.0). 3. Hepatic steatosis. 4. Left adrenal adenoma.   06/02/2019 Imaging   MRI 1. Potential slight enlargement of dominant cystic area and solid component, associated with rind like signal variation on T2 along  the inferior right hepatic margin, also potentially slightly increased. Findings may still be within the realm of measurement and technical variation. Close attention on follow-up. 2. Subtle cystic changes along the cephalad margin of the spleen are difficult to see on previous imaging, perhaps new compared with prior imaging studies. 3. Pelvic implants and left lower quadrant lesion with similar size, of the area along the left iliac vasculature may be slightly larger than on the prior study. 4. Signs of extensive retroperitoneal and pelvic lymphadenectomy. 5. Hepatic steatosis. 6. Left adrenal adenoma along  with stable appearance of Bosniak 2 lesion in the left kidney.     06/08/2019 Cancer Staging   Staging form: [place], AJCC 7th Edition - Clinical: Stage IIIC (rT2, N1, M0) - Signed by Heath Lark, MD on 06/08/2019   06/10/2019 Imaging   1. 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 These findings are slightly worsened in comparison to a remote prior CT examination dated 10/04/2014.   2.  No evidence of metastatic disease in the chest.   3. Status post hysterectomy, pelvic and retroperitoneal lymph node dissection, and ventral hernia mesh repair.   4.  Hepatic steatosis.   5.  Aortic Atherosclerosis (ICD10-I70.0).   06/24/2019 - 09/02/2019 Chemotherapy   The patient had carboplatin for chemotherapy treatment.     09/02/2019 Tumor Marker   Patient's tumor was tested for the following markers: Inhibin B Results of the tumor marker test revealed 94   09/23/2019 Imaging   1. Interval progression of the soft tissue lesions in the anterior left pelvis, likely peritoneal implants, compatible with disease progression. Remaining sites of apparent disease along the liver capsule and posterior spleen are stable 2. Stable 2 cm left adrenal adenoma. 3. Hepatic steatosis. 4. Right-side predominant colonic diverticulosis without diverticulitis. 5. Aortic Atherosclerosis (ICD10-I70.0).   12/23/2019 Imaging   1. Slight interval increase in size of a mixed solid and cystic nodule in the left hemipelvis measuring 3.0 x 2.9 cm, previously 2.7 x 2.1 cm. 2. Interval decrease in size of a nodule in the left paracolic gutter measuring 1.0 x 0.8 cm, previously 2.1 x 1.6 cm. 3. Stable peritoneal nodules in the hepatorenal recess and at the inferior tip of the spleen. 4. Unchanged nodule or lymph node overlying the left external iliac  artery. 5. Enlargement of dominant left pelvic nodule is concerning for disease progression despite interval decrease in size of a nodule in the left paracolic gutter and stability of other nodules. 6. No evidence of metastatic disease in the chest. 7. Stable, benign left adrenal adenoma. 8. Ventral hernia mesh repair with a small component of recurrent hernia inferiorly, containing a single nonobstructed loop of small bowel. 9. Hepatic steatosis. 10. Aortic Atherosclerosis (ICD10-I70.0).   12/23/2019 Tumor Marker   Patient's tumor was tested for the following markers: Inhibin B Results of the tumor marker test revealed 94.9   01/25/2020 Tumor Marker   Patient's tumor was tested for the following markers: Inhibin B Results of the tumor marker test revealed 116.7   02/20/2020 Genetic Testing   Negative genetic testing: no pathogenic variants detected in [facility] Multi-Cancer Panel.  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  The report date is February 20, 2020.   The Multi-Cancer Panel offered by [facility] includes sequencing and/or deletion duplication testing of the following 85 genes:  AIP, ALK, APC, ATM, AXIN2,BAP1,  BARD1, BLM, BMPR1A, BRCA1, BRCA2, BRIP1, CASR, CDC73, CDH1, CDK4, CDKN1B, CDKN1C, CDKN2A (p14ARF), CDKN2A (p16INK4a), CEBPA, CHEK2, CTNNA1, DICER1, DIS3L2, EGFR (c.2369C>T, p.Thr790Met variant only), EPCAM (Deletion/duplication testing only), FH, FLCN, GATA2, GPC3, GREM1 (Promoter region deletion/duplication testing only), HOXB13 (c.251G>A, p.Gly84Glu), HRAS, KIT, MAX, MEN1, MET, MITF (c.952G>A, p.Glu318Lys variant only), MLH1, MSH2, MSH3, MSH6, MUTYH, NBN, NF1, NF2, NTHL1, PALB2, PDGFRA, PHOX2B, PMS2, POLD1, POLE, POT1, PRKAR1A, PTCH1, PTEN, RAD50, RAD51C, RAD51D, RB1, RECQL4, RET, RNF43, RUNX1, SDHAF2, SDHA (sequence changes only), SDHB, SDHC, SDHD, SMAD4, SMARCA4, SMARCB1, SMARCE1, STK11, SUFU, TERC, TERT, TMEM127, TP53, TSC1, TSC2, VHL, WRN and WT1.     03/02/2020 Tumor Marker   Patient's tumor was tested for the following markers: Inhibin B Results of the tumor marker test revealed 80.8   04/17/2020 Imaging   1. Overall, exam is stable. Multiple peritoneal nodules are again seen. The index nodule in the left lower quadrant is mildly increased in size in the interval. The lesion within the anterior left pelvis has decreased in size in the interval. There has also been decrease in size of cystic lesion within the a hepatorenal recess. The remaining peritoneal lesions are unchanged. No new lesions identified. 2. Stable left adrenal nodule. 3.  Aortic Atherosclerosis (ICD10-I70.0).     08/21/2020 Imaging   Bone density is normal AP spine T score 0.8 Femoral neck on the left, T score -0.2 Femoral neck on the right ,T score -0.4   10/17/2020 Imaging   1. Interval progression of peritoneal nodules along the left pelvic sidewall, concerning for progressive metastatic disease. 2. Small capsular lesion medial right liver and the apparent cystic lesion superior to the right kidney are similar to prior. 3. Tiny soft tissue nodule anterior aspect of the lateral left pelvis described as decreasing on the prior study has decreased further on today's exam. 4. Stable left adrenal nodule. This cannot be definitively characterized. 5. Tiny nonobstructing stone lower pole right kidney. 6. Aortic Atherosclerosis (ICD10-I70.0).   10/26/2020 Procedure   Successful placement of a right internal jugular approach power injectable Port-A-Cath. The catheter is ready for immediate use.       10/31/2020 -  Chemotherapy    Patient is on Treatment Plan: OVARIAN PACLITAXEL K0,8,81,10 Q28D         PHYSICAL EXAMINATION: ECOG PERFORMANCE STATUS: 0 - Asymptomatic GENERAL:alert, no distress and comfortable SKIN: There is erythematous rash near her right breast that does not resemble cellulitis.  It could be due to eczematous rash.  It is not due to  medications. NEURO: alert & oriented x 3 with fluent speech, no focal motor/sensory deficits  LABORATORY DATA:  I have reviewed the data as listed    Component Value Date/Time   NA 138 12/26/2020 0826   NA 139 11/30/2019 0913   NA 141 09/24/2013 1131   K 4.5 12/26/2020 0826   K 4.5 09/24/2013 1131   CL 101 12/26/2020 0826   CO2 26 12/26/2020 0826   CO2 24 09/24/2013 1131   GLUCOSE 84 12/26/2020 0826   GLUCOSE 90 09/24/2013 1131   BUN 24 (H) 12/26/2020 0826   BUN 32 (H) 11/30/2019 0913   BUN 21.2 09/24/2013 1131   CREATININE 1.23 (H) 12/26/2020 0826   CREATININE 1.1 09/24/2013 1131   CALCIUM 9.8 12/26/2020 0826   CALCIUM 9.5 09/24/2013 1131   PROT 7.1 12/26/2020 0826   PROT 7.0 11/30/2019 0913   PROT 6.8 09/24/2013 1131   ALBUMIN 3.9 12/26/2020 0826  ALBUMIN 4.3 11/30/2019 0913   ALBUMIN 3.9 09/24/2013 1131   AST 16 12/26/2020 0826   AST 23 09/24/2013 1131   ALT 17 12/26/2020 0826   ALT 27 09/24/2013 1131   ALKPHOS 62 12/26/2020 0826   ALKPHOS 81 09/24/2013 1131   BILITOT 0.5 12/26/2020 0826   BILITOT 0.53 09/24/2013 1131   GFRNONAA 49 (L) 12/26/2020 0826   GFRAA 55 (L) 01/24/2020 1530   GFRAA 57 (L) 12/21/2019 1259    No results found for: SPEP, UPEP  Lab Results  Component Value Date   WBC 5.4 12/26/2020   NEUTROABS 3.0 12/26/2020   HGB 13.6 12/26/2020   HCT 40.1 12/26/2020   MCV 94.4 12/26/2020   PLT 326 12/26/2020      Chemistry      Component Value Date/Time   NA 138 12/26/2020 0826   NA 139 11/30/2019 0913   NA 141 09/24/2013 1131   K 4.5 12/26/2020 0826   K 4.5 09/24/2013 1131   CL 101 12/26/2020 0826   CO2 26 12/26/2020 0826   CO2 24 09/24/2013 1131   BUN 24 (H) 12/26/2020 0826   BUN 32 (H) 11/30/2019 0913   BUN 21.2 09/24/2013 1131   CREATININE 1.23 (H) 12/26/2020 0826   CREATININE 1.1 09/24/2013 1131      Component Value Date/Time   CALCIUM 9.8 12/26/2020 0826   CALCIUM 9.5 09/24/2013 1131   ALKPHOS 62 12/26/2020 0826   ALKPHOS 81  09/24/2013 1131   AST 16 12/26/2020 0826   AST 23 09/24/2013 1131   ALT 17 12/26/2020 0826   ALT 27 09/24/2013 1131   BILITOT 0.5 12/26/2020 0826   BILITOT 0.53 09/24/2013 1131

## 2020-12-27 NOTE — Assessment & Plan Note (Signed)
The cause of the skin rash is unknown but it does not look like cellulitis She is reassured Observe closely for now

## 2020-12-27 NOTE — Assessment & Plan Note (Signed)
She completed her chemotherapy without difficulties I will see her next week for further follow-up

## 2020-12-31 IMAGING — MR MRI PELVIS WITHOUT AND WITH CONTRAST
10 of 25 series · 21 of 48 positions shown · IV contrast (Yes)
Comparison: 04/16/2018

CLINICAL DATA: Follow-up malignant granulosa cell tumor of the
ovary

EXAM:
MRI ABDOMEN AND PELVIS WITHOUT AND WITH CONTRAST
TECHNIQUE: Multiplanar multisequence MR imaging of the abdomen and pelvis was
performed both before and after the administration of intravenous
contrast.
CONTRAST:  9 mL Gadovist IV

[Series 3: T2 fat-sat · axial · 5.0mm · 0.78mm/px · z∈[-184,+96]mm · 3 of 57 slices shown (1 of 2)]
[im 1/57]
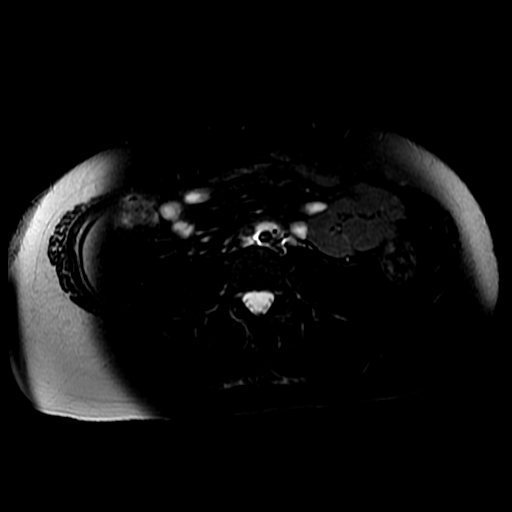
[im 29/57]
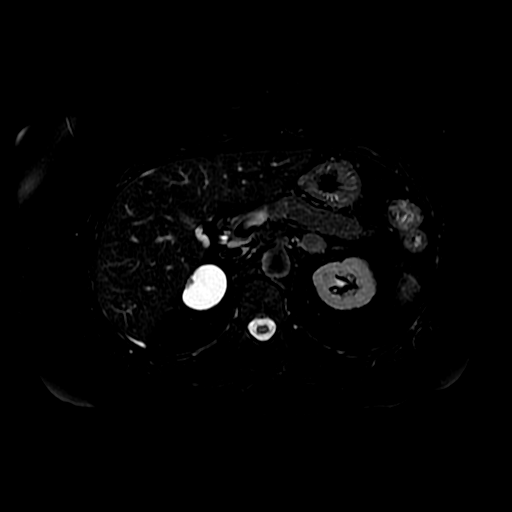
[im 57/57]
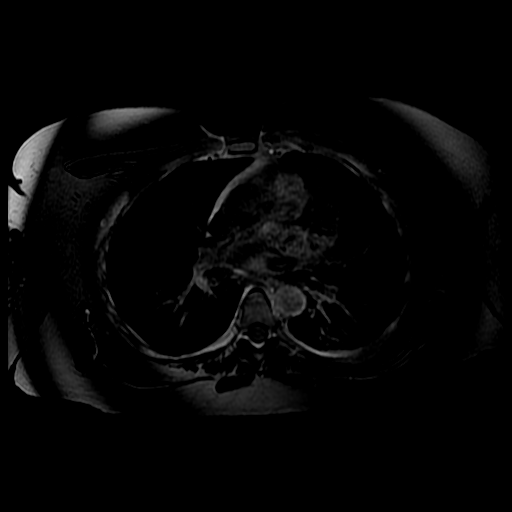

[Series 4: DWI b500 · axial · 6.0mm · 1.64mm/px · z∈[-167,+106]mm · 4 of 72 slices shown]
[im 1/72]
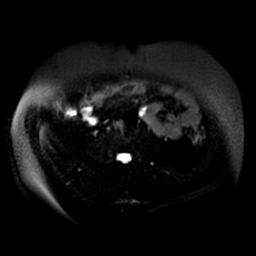
[im 24/72]
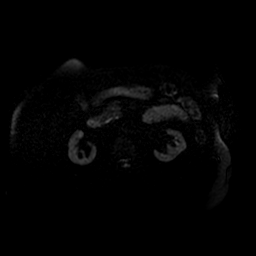
[im 48/72]
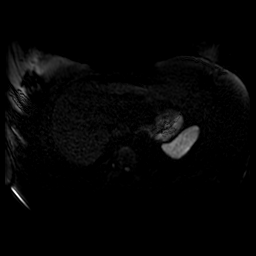
[im 72/72]
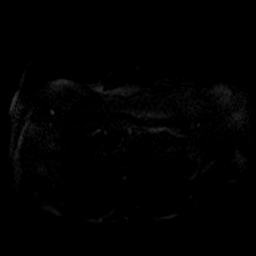

[Series 5: T2 · axial · 5.0mm · 0.78mm/px · z∈[-184,+96]mm · 2 of 57 slices shown (1 of 5)]
[im 1/57]
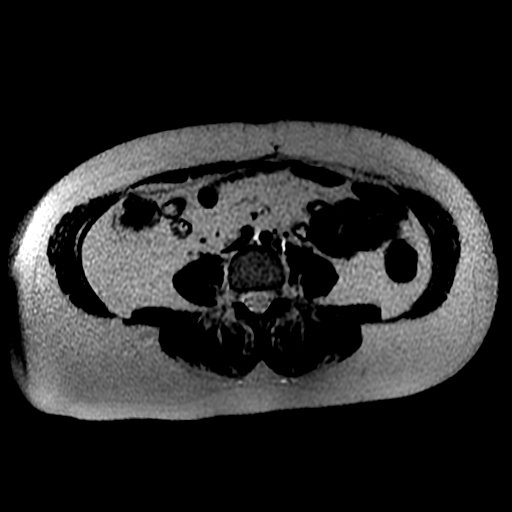
[im 57/57]
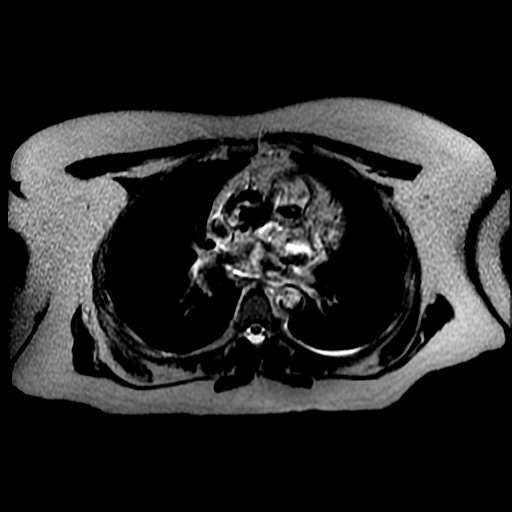

[Series 6: T2 · coronal · 5.0mm · 0.76mm/px · 2 of 49 slices shown (2 of 5)]
[im 1/49]
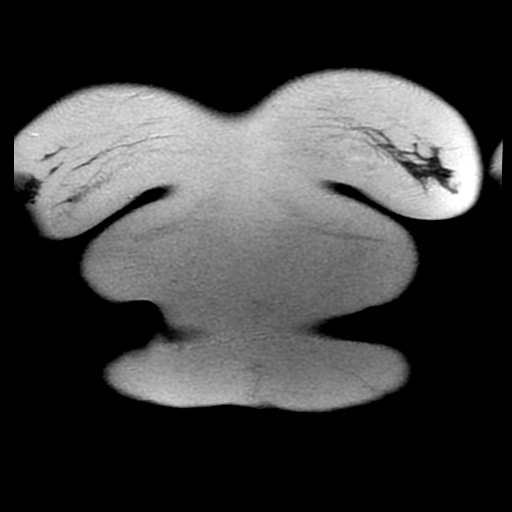
[im 49/49]
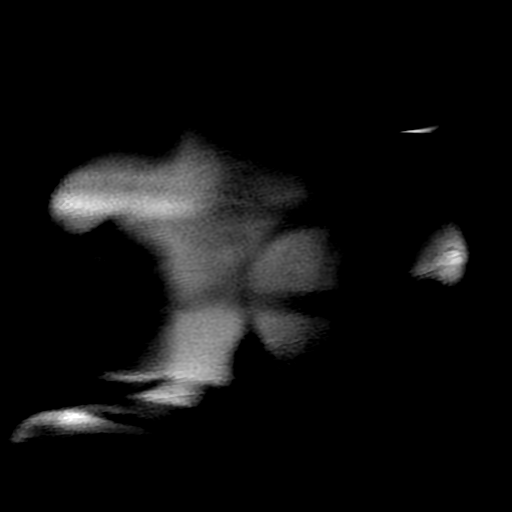

[Series 7: bSSFP · axial · 5.0mm · 0.78mm/px · z∈[-184,+96]mm · 2 of 57 slices shown]
[im 1/57]
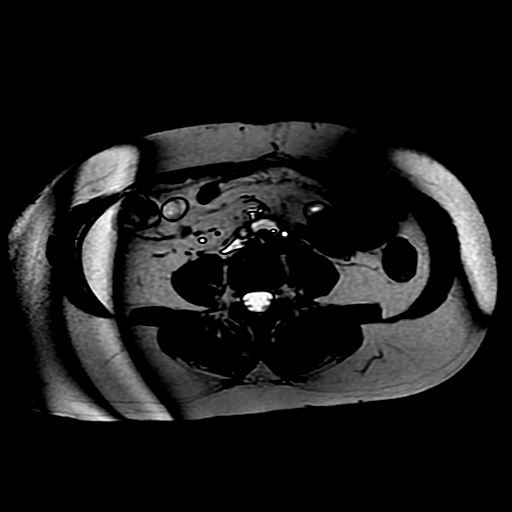
[im 57/57]
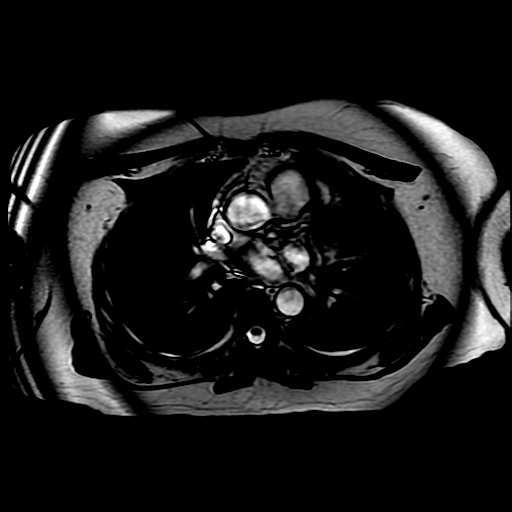

[Series 8: ax dualecho bh · axial · 5.0mm · 0.78mm/px · z∈[-184,+96]mm · 4 of 114 slices shown]
[im 1/114]
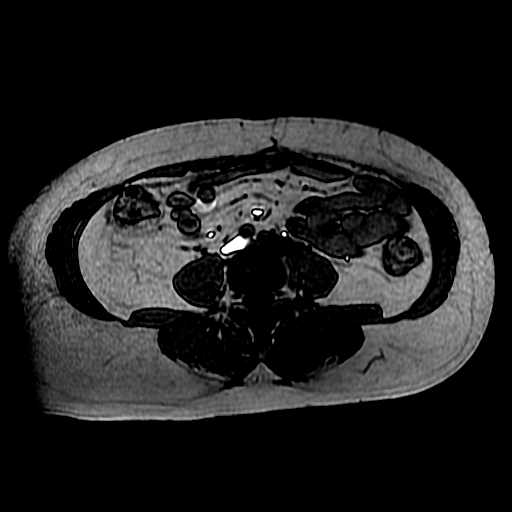
[im 38/114]
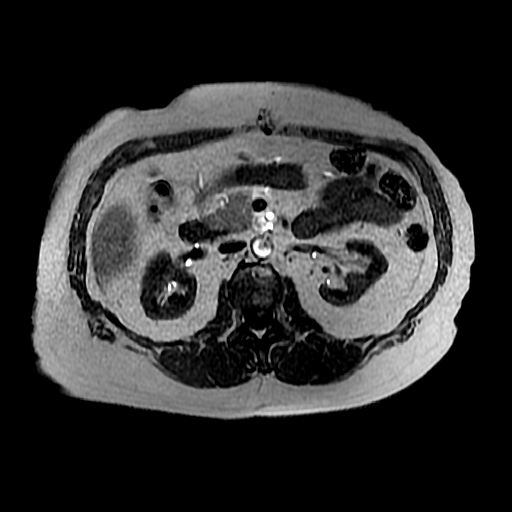
[im 76/114]
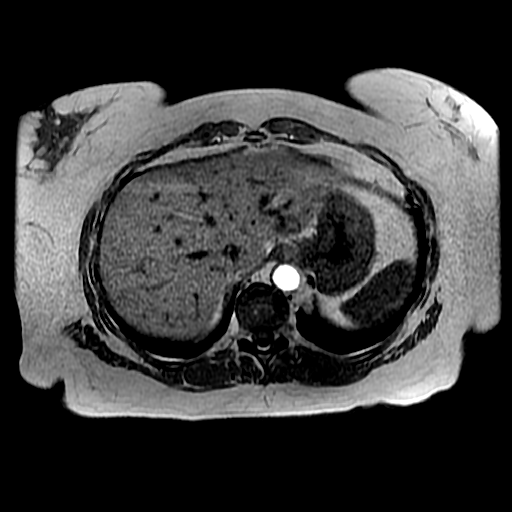
[im 114/114]
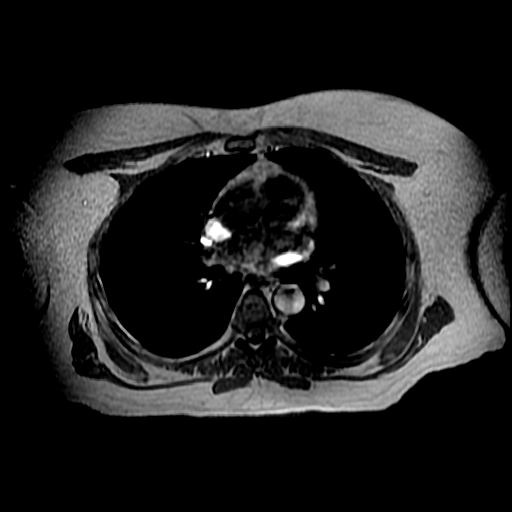

[Series 11: T2 · coronal · 5.0mm · 0.47mm/px · 1 of 32 slices shown (3 of 5)]
[im 1/32]
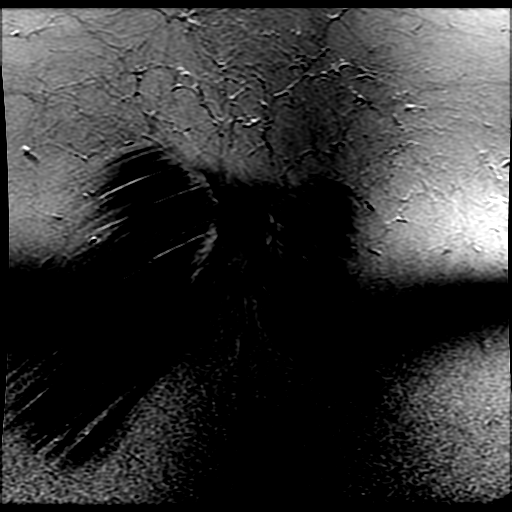

[Series 13: T2 · axial · 5.0mm · 0.47mm/px · 1 of 34 slices shown (4 of 5)]
[im 1/34]
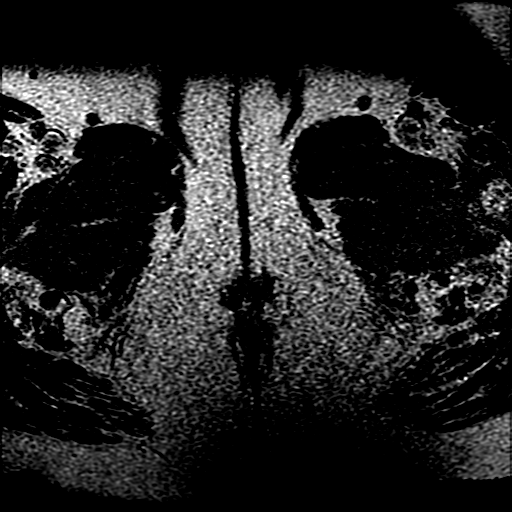

[Series 14: T2 fat-sat · axial · 5.0mm · 0.47mm/px · 1 of 34 slices shown (2 of 2)]
[im 1/34]
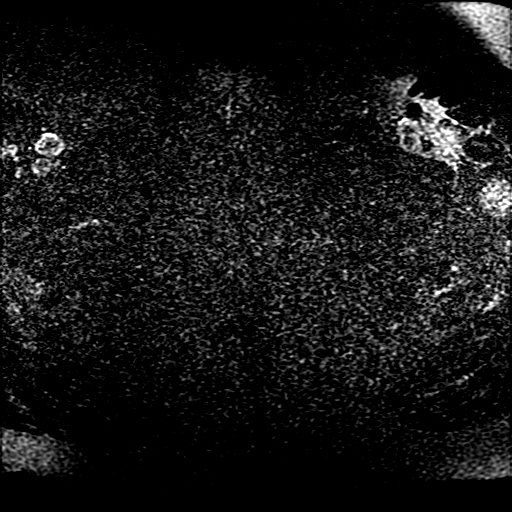

[Series 15: T2 · sagittal · 5.0mm · 0.47mm/px · 1 of 34 slices shown (5 of 5)]
[im 1/34]
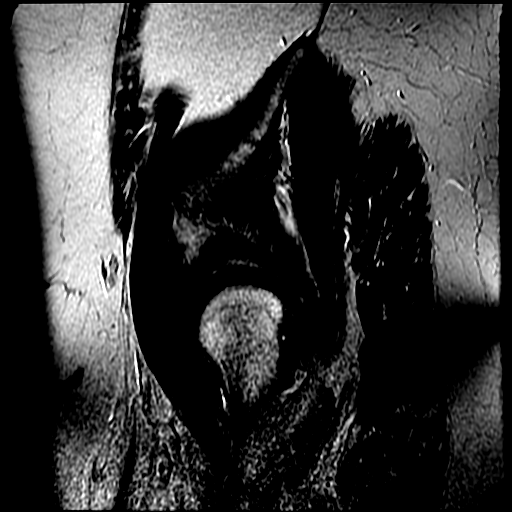

[21 of 48 positions shown; findings below may reference images not displayed]

FINDINGS: Lower chest: Lung bases are clear.

Hepatobiliary: No suspicious/enhancing hepatic lesions. Moderate
hepatic steatosis. Scattered cystic lesion/peritoneal implants along
the posterior hepatic surface, including a dominant 3.7 x 3.2 cm
mixed cystic/solid implant along the posteromedial aspect of segment
6 (series 3/image 29), previously 3.5 x 2.8 cm.

Gallbladder is unremarkable. No intrahepatic or extrahepatic ductal
dilatation.

Pancreas: Tiny cysts in the pancreatic head/uncinate process (series
3/image 36), benign.

Spleen: Small peritoneal implants along the spleen, measuring up to
10 mm posteriorly (series 3/image 27), previously 9 mm.

Adrenals/Urinary Tract: 2.1 cm left adrenal nodule with signal loss
on opposed phase imaging, compatible with a benign adrenal adenoma.
Right adrenal gland is within normal limits.

1.8 cm cyst in the lateral interpolar left kidney (series 3/image
40), previously 1.6 cm, but simple and likely benign. Right kidney
is within normal limits. No hydronephrosis.

Bladder is within normal limits.

Stomach/Bowel: Stomach is within normal limits.

No evidence of bowel obstruction.

Visualized bowel is grossly unremarkable, noting mild right colonic
diverticulosis.

Vascular/Lymphatic:  No evidence of abdominal aortic aneurysm.

No suspicious abdominopelvic lymphadenopathy.

Status post left iliac lymph node dissection with associated
susceptibility artifact.

Reproductive: Status post hysterectomy and bilateral
salpingo-oophorectomy.

Other:  No abdominopelvic ascites.

1.8 x 1.5 cm mixed cystic/solid implant in the left lower quadrant
(series 14/image 7-8).

1.4 x 1.7 cm mixed cystic/solid implant adjacent to the left iliac
vessels (series 17/image 8), previously 1.5 x 1.9 cm.

1.2 x 1.5 cm solid implant along the left pelvic sidewall (series
17/image 19), previously 1.0 x 1.4 cm.

Musculoskeletal: No focal osseous lesions.
IMPRESSION: Status post hysterectomy and bilateral salpingo-oophorectomy.

Mixed cystic/solid peritoneal implants in the abdomen/pelvis, as
above. Dominant cystic implant along the posterior liver surface is
mildly increased. Remaining lesions are overall grossly unchanged.

No new lesions are identified.

## 2021-01-02 ENCOUNTER — Encounter: Payer: Self-pay | Admitting: Hematology and Oncology

## 2021-01-03 ENCOUNTER — Other Ambulatory Visit: Payer: Self-pay

## 2021-01-03 ENCOUNTER — Other Ambulatory Visit (HOSPITAL_COMMUNITY): Payer: Self-pay

## 2021-01-03 ENCOUNTER — Encounter (INDEPENDENT_AMBULATORY_CARE_PROVIDER_SITE_OTHER): Payer: Self-pay | Admitting: Family Medicine

## 2021-01-03 ENCOUNTER — Ambulatory Visit (INDEPENDENT_AMBULATORY_CARE_PROVIDER_SITE_OTHER): Payer: 59 | Admitting: Family Medicine

## 2021-01-03 VITALS — BP 121/75 | HR 71 | Temp 97.5°F | Ht 66.0 in | Wt 187.0 lb

## 2021-01-03 DIAGNOSIS — F3289 Other specified depressive episodes: Secondary | ICD-10-CM

## 2021-01-03 DIAGNOSIS — E8881 Metabolic syndrome: Secondary | ICD-10-CM | POA: Diagnosis not present

## 2021-01-03 DIAGNOSIS — E669 Obesity, unspecified: Secondary | ICD-10-CM | POA: Diagnosis not present

## 2021-01-03 DIAGNOSIS — Z6833 Body mass index (BMI) 33.0-33.9, adult: Secondary | ICD-10-CM | POA: Diagnosis not present

## 2021-01-03 DIAGNOSIS — Z9189 Other specified personal risk factors, not elsewhere classified: Secondary | ICD-10-CM

## 2021-01-03 MED ORDER — BUPROPION HCL ER (SR) 200 MG PO TB12
200.0000 mg | ORAL_TABLET | Freq: Two times a day (BID) | ORAL | 0 refills | Status: DC
Start: 2021-01-03 — End: 2021-01-23
  Filled 2021-01-03: qty 60, 30d supply, fill #0

## 2021-01-03 MED ORDER — METFORMIN HCL 500 MG PO TABS
500.0000 mg | ORAL_TABLET | Freq: Two times a day (BID) | ORAL | 0 refills | Status: DC
  Filled 2021-01-03: qty 60, 30d supply, fill #0

## 2021-01-04 ENCOUNTER — Other Ambulatory Visit: Payer: Self-pay | Admitting: Hematology and Oncology

## 2021-01-04 NOTE — Progress Notes (Signed)
Chief Complaint:   OBESITY [name] is here to discuss her progress with her obesity treatment plan along with follow-up of her obesity related diagnoses. [name] is on the Category 2 Plan and states she is following her eating plan approximately 75% of the time. [place] states she is doing 0 minutes 0 times per week.  Today's visit was #: 61 Starting weight: 209 lbs Starting date: 04/29/2018 Today's weight: 187 lbs Today's date: 01/03/2021 Total lbs lost to date: 22 Total lbs lost since last in-office visit: 0  Interim History: [name] is struggling with dinner meal and she is not really hungry. She has a Coke and some chips for dinner some nights. She finds it difficult to get all of her food in within the day.  Subjective:   1. Insulin resistance [name] notes some carbohydrate cravings, but she is not eating her protein and having carbohydrate-rich, protein-poor meals.  2. Other depression, with emotional eating [name] is struggling with emotional eating and using food for comfort to the extent that it is negatively impacting her health. She has been working on behavior modification techniques to help reduce her emotional eating and has been somewhat successful. She shows no sign of suicidal or homicidal ideations.  3. At risk for deficient intake of food [name] is at a higher than average risk of deficient intake of food due to inadequate intake.  Assessment/Plan:  No orders of the defined types were placed in this encounter.   Medications Discontinued During This Encounter  Medication Reason   buPROPion (WELLBUTRIN SR) 200 MG 12 hr tablet Reorder   metFORMIN (GLUCOPHAGE) 500 MG tablet Reorder     Meds ordered this encounter  Medications   buPROPion (WELLBUTRIN SR) 200 MG 12 hr tablet    Sig: Take 1 tablet by mouth 2 times daily.    Dispense:  60 tablet    Refill:  0    30 d supply; ov for RF   metFORMIN (GLUCOPHAGE) 500 MG tablet    Sig: Take 1 tablet by mouth 2 times daily  with a meal.    Dispense:  60 tablet    Refill:  0    30 d supply; ov for RF     1. Insulin resistance [name] was educated on the importance of increasing protein and decreasing simple carbohydrates to help control her cravings. We will refill metformin for 1 month. [name] agreed to follow-up with [name] as directed to closely monitor her progress.  - metFORMIN (GLUCOPHAGE) 500 MG tablet; Take 1 tablet by mouth 2 times daily with a meal.  Dispense: 60 tablet; Refill: 0  2. Other depression, with emotional eating Behavior modification techniques were discussed today to help [name] deal with her emotional/non-hunger eating behaviors. We will refill Wellbutrin SR for 1 month. Orders and follow up as documented in patient record.   - buPROPion (WELLBUTRIN SR) 200 MG 12 hr tablet; Take 1 tablet by mouth 2 times daily.  Dispense: 60 tablet; Refill: 0  3. At risk for deficient intake of food [name] was given approximately 9 minutes of deficit intake of food prevention counseling today. [name] is at risk for eating too few calories based on current food recall. She was encouraged to focus on meeting caloric and protein goals according to her recommended meal plan.   4. Obesity with current BMI of 30.2 [name] is currently in the action stage of change. As such, her goal is to continue with weight loss efforts. She has agreed  to change to keeping a food journal and adhering to recommended goals of 1300-1400 calories and 90+ grams of protein daily.   Exercise goals: As is.  Behavioral modification strategies: increasing lean protein intake, decreasing simple carbohydrates, and planning for success.  [name] has agreed to follow-up with our clinic in 3 weeks. She was informed of the importance of frequent follow-up visits to maximize her success with intensive lifestyle modifications for her multiple health conditions.   Objective:   Blood pressure 121/75, pulse 71, temperature (!) 97.5 F (36.4 C), height 5'  6" (1.676 m), weight 187 lb (84.8 kg), SpO2 98 %. Body mass index is 30.18 kg/m.  General: Cooperative, alert, well developed, in no acute distress. HEENT: Conjunctivae and lids unremarkable. Cardiovascular: Regular rhythm.  Lungs: Normal work of breathing. Neurologic: No focal deficits.   Lab Results  Component Value Date   CREATININE 1.23 (H) 12/26/2020   BUN 24 (H) 12/26/2020   NA 138 12/26/2020   K 4.5 12/26/2020   CL 101 12/26/2020   CO2 26 12/26/2020   Lab Results  Component Value Date   ALT 17 12/26/2020   AST 16 12/26/2020   ALKPHOS 62 12/26/2020   BILITOT 0.5 12/26/2020   Lab Results  Component Value Date   HGBA1C 5.2 11/30/2019   HGBA1C 5.3 05/31/2019   HGBA1C 5.2 11/19/2018   HGBA1C 5.5 04/29/2018   Lab Results  Component Value Date   INSULIN 23.8 11/30/2019   INSULIN 16.5 05/31/2019   INSULIN 17.6 11/19/2018   INSULIN 25.8 (H) 04/29/2018   Lab Results  Component Value Date   TSH 0.707 11/30/2019   Lab Results  Component Value Date   CHOL 224 (H) 11/30/2019   HDL 52 11/30/2019   LDLCALC 136 (H) 11/30/2019   TRIG 201 (H) 11/30/2019   Lab Results  Component Value Date   VD25OH 59.0 11/30/2019   VD25OH 54.0 05/31/2019   VD25OH 42.8 11/19/2018   Lab Results  Component Value Date   WBC 5.4 12/26/2020   HGB 13.6 12/26/2020   HCT 40.1 12/26/2020   MCV 94.4 12/26/2020   PLT 326 12/26/2020   No results found for: IRON, TIBC, FERRITIN  Attestation Statements:   Reviewed by clinician on day of visit: allergies, medications, problem list, medical history, surgical history, family history, social history, and previous encounter notes.   Wilhemena Durie, am acting as transcriptionist for [employer], DO.  I have reviewed the above documentation for accuracy and completeness, and I agree with the above. Marjory Sneddon, D.O.  The [place] was signed into law in 2016 which includes the topic of electronic health records.   This provides immediate access to information in MyChart.  This includes consultation notes, operative notes, office notes, lab results and pathology reports.  If you have any questions about what you read please let us know at your next visit so we can discuss your concerns and take corrective action if need be.  We are right here with you.

## 2021-01-05 ENCOUNTER — Other Ambulatory Visit (HOSPITAL_COMMUNITY): Payer: Self-pay

## 2021-01-08 ENCOUNTER — Other Ambulatory Visit: Payer: Self-pay

## 2021-01-08 ENCOUNTER — Inpatient Hospital Stay (HOSPITAL_BASED_OUTPATIENT_CLINIC_OR_DEPARTMENT_OTHER): Payer: 59 | Admitting: Hematology and Oncology

## 2021-01-08 ENCOUNTER — Encounter: Payer: Self-pay | Admitting: Hematology and Oncology

## 2021-01-08 ENCOUNTER — Inpatient Hospital Stay: Payer: 59

## 2021-01-08 VITALS — BP 136/71 | HR 72 | Temp 98.3°F | Resp 18 | Ht 66.0 in | Wt 189.4 lb

## 2021-01-08 DIAGNOSIS — Z5111 Encounter for antineoplastic chemotherapy: Secondary | ICD-10-CM | POA: Diagnosis not present

## 2021-01-08 DIAGNOSIS — C569 Malignant neoplasm of unspecified ovary: Secondary | ICD-10-CM

## 2021-01-08 DIAGNOSIS — R21 Rash and other nonspecific skin eruption: Secondary | ICD-10-CM | POA: Diagnosis not present

## 2021-01-08 DIAGNOSIS — N183 Chronic kidney disease, stage 3 unspecified: Secondary | ICD-10-CM

## 2021-01-08 DIAGNOSIS — Z23 Encounter for immunization: Secondary | ICD-10-CM | POA: Diagnosis not present

## 2021-01-08 DIAGNOSIS — Z7189 Other specified counseling: Secondary | ICD-10-CM

## 2021-01-08 LAB — CBC WITH DIFFERENTIAL (CANCER CENTER ONLY)
Abs Immature Granulocytes: 0.01 10*3/uL (ref 0.00–0.07)
Basophils Absolute: 0.1 10*3/uL (ref 0.0–0.1)
Basophils Relative: 1 %
Eosinophils Absolute: 0.1 10*3/uL (ref 0.0–0.5)
Eosinophils Relative: 2 %
HCT: 39.3 % (ref 36.0–46.0)
Hemoglobin: 13.4 g/dL (ref 12.0–15.0)
Immature Granulocytes: 0 %
Lymphocytes Relative: 31 %
Lymphs Abs: 1.8 10*3/uL (ref 0.7–4.0)
MCH: 32.1 pg (ref 26.0–34.0)
MCHC: 34.1 g/dL (ref 30.0–36.0)
MCV: 94 fL (ref 80.0–100.0)
Monocytes Absolute: 0.7 10*3/uL (ref 0.1–1.0)
Monocytes Relative: 11 %
Neutro Abs: 3.2 10*3/uL (ref 1.7–7.7)
Neutrophils Relative %: 55 %
Platelet Count: 298 10*3/uL (ref 150–400)
RBC: 4.18 MIL/uL (ref 3.87–5.11)
RDW: 14.5 % (ref 11.5–15.5)
WBC Count: 5.8 10*3/uL (ref 4.0–10.5)
nRBC: 0 % (ref 0.0–0.2)

## 2021-01-08 LAB — CMP (CANCER CENTER ONLY)
ALT: 19 U/L (ref 0–44)
AST: 20 U/L (ref 15–41)
Albumin: 4 g/dL (ref 3.5–5.0)
Alkaline Phosphatase: 62 U/L (ref 38–126)
Anion gap: 11 (ref 5–15)
BUN: 28 mg/dL — ABNORMAL HIGH (ref 8–23)
CO2: 25 mmol/L (ref 22–32)
Calcium: 9.8 mg/dL (ref 8.9–10.3)
Chloride: 104 mmol/L (ref 98–111)
Creatinine: 1.27 mg/dL — ABNORMAL HIGH (ref 0.44–1.00)
GFR, Estimated: 47 mL/min — ABNORMAL LOW (ref >60)
Glucose, Bld: 74 mg/dL (ref 70–99)
Potassium: 4.4 mmol/L (ref 3.5–5.1)
Sodium: 140 mmol/L (ref 135–145)
Total Bilirubin: 0.5 mg/dL (ref 0.3–1.2)
Total Protein: 7.1 g/dL (ref 6.5–8.1)

## 2021-01-08 NOTE — Assessment & Plan Note (Signed)
Renal function is stable but she is prone to get dehydrated We discussed importance of oral fluid intake

## 2021-01-08 NOTE — Assessment & Plan Note (Signed)
She tolerated recent reduced dose Taxol well She will continue day 1 and day 8 treatment, rest day 15 I plan to repeat imaging study in October

## 2021-01-08 NOTE — Progress Notes (Signed)
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[name]) She tolerated recent reduced dose Taxol well She will continue day 1 and day 8 treatment, rest day 15 I plan to repeat imaging study in October  CKD (chronic kidney disease), stage III Renal function is stable but she is prone to get dehydrated We discussed importance of oral fluid intake  Orders Placed This Encounter  Procedures   CT ABDOMEN PELVIS W CONTRAST    Standing Status:   Future    Standing Expiration Date:   01/08/2022    Order Specific Question:   If indicated for the ordered procedure, I authorize the administration of contrast media per Radiology protocol    Answer:   Yes    Order Specific Question:   Preferred imaging location?    Answer:   [facility]    Order Specific Question:   Radiology Contrast Protocol - do NOT remove file path    Answer:   [email or web address]    All questions were answered. The patient knows to call the clinic with any problems, questions or concerns. The total time spent in the appointment was 20 minutes encounter with patients including review of chart and various tests results, discussions about plan of care and coordination of care plan   Heath Lark, MD 01/08/2021 9:46 AM  INTERVAL HISTORY: Please see below for problem oriented charting. she returns for treatment follow-up seen prior to her paclitaxel treatment for malignant granulosa cell tumor of the ovary She tolerated last cycle of treatment well No nausea No recent changes in bowel habits Denies worsening peripheral neuropathy Her previous skin changes has resolved  REVIEW OF SYSTEMS:   Constitutional: Denies fevers, chills or abnormal weight loss Eyes: Denies blurriness of vision Ears, nose, mouth, throat, and face: Denies mucositis or sore  throat Respiratory: Denies cough, dyspnea or wheezes Cardiovascular: Denies palpitation, chest discomfort or lower extremity swelling Gastrointestinal:  Denies nausea, heartburn or change in bowel habits Skin: Denies abnormal skin rashes Lymphatics: Denies new lymphadenopathy or easy bruising Neurological:Denies numbness, tingling or new weaknesses Behavioral/Psych: Mood is stable, no new changes  All other systems were reviewed with the patient and are negative.  I have reviewed the past medical history, past surgical history, social history and family history with the patient and they are unchanged from previous note.  ALLERGIES:  is allergic to codeine, hydrocodone-acetaminophen, oxycodone, sodium thiosalicylate, thimerosal, tramadol hcl, ciprofloxacin, daypro [oxaprozin], levofloxacin, stadol [butorphanol tartrate], and sulfa antibiotics.  MEDICATIONS:  Current Outpatient Medications  Medication Sig Dispense Refill   acetaminophen (TYLENOL) 500 MG tablet Take 1,000 mg by mouth every 6 (six) hours as needed for moderate pain.     Biotin 5 MG TABS Take 5 mg by mouth daily.     buPROPion (WELLBUTRIN SR) 200 MG 12 hr tablet Take 1 tablet by mouth 2 times daily. 60 tablet 0   Ca Phosphate-Cholecalciferol (EQL CALCIUM GUMMIES) 250-400 MG-UNIT CHEW Chew 1 tablet by mouth 2 (two) times daily.     escitalopram (LEXAPRO) 20 MG tablet TAKE 1 TABLET BY MOUTH ONCE A DAY (Patient taking differently: Take 20 mg by mouth daily. Taking 10 mg daily, temporarily due to LFT'S.) 90 tablet 3   lidocaine-prilocaine (EMLA) cream Apply to affected area once (Patient taking differently: Apply 1 application topically daily as needed (port access).) 30 g 3   LORazepam (ATIVAN)  0.5 MG tablet Take 1 tablet (0.5 mg total) by mouth 2 (two) times daily as needed for anxiety. 30 tablet 0   metFORMIN (GLUCOPHAGE) 500 MG tablet Take 1 tablet by mouth 2 times daily with a meal. 60 tablet 0   mirabegron ER (MYRBETRIQ) 50 MG  TB24 tablet Take 1 tablet by mouth once daily 90 tablet 3   omeprazole (PRILOSEC) 20 MG capsule Take 1 capsule by mouth once daily 90 capsule 3   ondansetron (ZOFRAN) 8 MG tablet Take 1 tablet (8 mg total) by mouth every 8 (eight) hours as needed for nausea or vomiting. 60 tablet 0   Polyethyl Glycol-Propyl Glycol (SYSTANE OP) Place 1 drop into both eyes daily as needed (dry eyes).     Probiotic Product (ALIGN PO) Take 1 tablet by mouth daily.      prochlorperazine (COMPAZINE) 10 MG tablet Take 10 mg by mouth every 6 (six) hours as needed for nausea or vomiting.     No current facility-administered medications for this visit.    SUMMARY OF ONCOLOGIC HISTORY: Oncology History Overview Note  2018 - Core biopsy for ER/PR and Foundation One testing performed. Unfortunately, no sufficient tissue for Foundation One testing. ER was 50% and PR 90%. 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     - 09/2000 Chemotherapy   6 cycles of IP cisplatin and etoposide    02/2009 Relapse/Recurrence   CT showed increased size of nodules in pelvis   2010 Surgery   Exlap with section of tumor nodules near cecum and left pelvic sidewall. Tumor: ER negative, PR positive    Treatment Plan Change   Alternated 2 week courses of Megace and Tamoxifen - ended 03/2012   03/2012 PET scan   CT  - progressive disease   2013 Treatment Plan Change   Letrozole   01/2016 Imaging   MRI showed progressive disease   2017 Treatment Plan Change   Two weeks of alternating Tamoxifen 17m daily and then Megace 453mTID   08/2016 Imaging   MRI showed progressive disease with peritoneal implants near liver, in pelvis   01/2017 Treatment Plan Change   Lupron 11.25 q 3 months   11/2017 Imaging   Overall mixed response.   Mixed cystic/solid lesions in the left pelvis are mildly improved.   Cystic peritoneal disease, including the dominant lesion along the posterior right hepatic lobe, is mildly progressed.   Subcapsular lesion along the posterior right hepatic lobe is unchanged.   03/2018 Imaging   MRI A/P: Mixed response of individual peritoneal metastases in the pelvis, as described above. Overall, there has been no significant change in bulk of disease.   Stable cystic peritoneal metastatic disease along the capsular surfaces of the liver and spleen.   No new sites of metastatic disease identified within the abdomen or pelvis.   08/2018 Imaging   MRI A/P: Status post hysterectomy and bilateral salpingo-oophorectomy.   Mixed cystic/solid peritoneal implants in the abdomen/pelvis,  as above. Dominant cystic implant along the posterior liver surface is mildly increased. Remaining lesions are overall grossly unchanged.   No new lesions are identified.   01/28/2019 Imaging   Mri A/P: 1. Relatively similar appearance of peritoneal metastasis. A posterior right hepatic capsular based lesion is similar to minimally decreased in size. Left pelvic implants are primarily similar with possible enlargement of an anterior high left pelvic cystic implant. No new disease identified. 2.  Aortic Atherosclerosis (ICD10-I70.0). 3. Hepatic steatosis. 4. Left adrenal adenoma.   06/02/2019 Imaging   MRI 1. Potential slight enlargement of dominant cystic area and solid component, associated with rind  like signal variation on T2 along the inferior right hepatic margin, also potentially slightly increased. Findings may still be within the realm of measurement and technical variation. Close attention on follow-up. 2. Subtle cystic changes along the cephalad margin of the spleen are difficult to see on previous imaging, perhaps new compared with prior imaging studies. 3. Pelvic implants and left lower quadrant lesion with similar size, of the area along the left iliac vasculature may be slightly larger than on the prior study. 4. Signs of extensive retroperitoneal and pelvic lymphadenectomy. 5. Hepatic steatosis. 6. 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     06/08/2019 Cancer Staging   Staging form: [place], AJCC 7th Edition - Clinical: Stage IIIC (rT2, N1, M0) - Signed by Heath Lark, MD on 06/08/2019   06/10/2019 Imaging   1. 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 These findings are slightly worsened in comparison to a remote prior CT examination dated 10/04/2014.   2.  No evidence of metastatic disease in the chest.   3. Status post hysterectomy, pelvic and retroperitoneal lymph node dissection, and ventral hernia mesh repair.   4.  Hepatic steatosis.   5.  Aortic Atherosclerosis (ICD10-I70.0).   06/24/2019 - 09/02/2019 Chemotherapy   The patient had carboplatin for chemotherapy treatment.     09/02/2019 Tumor Marker   Patient's tumor was tested for the following markers: Inhibin B Results of the tumor marker test revealed 94   09/23/2019 Imaging   1. Interval progression of the soft tissue lesions in the anterior left pelvis, likely peritoneal implants, compatible with disease progression. Remaining sites of apparent disease along the liver capsule and  posterior spleen are stable 2. Stable 2 cm left adrenal adenoma. 3. Hepatic steatosis. 4. Right-side predominant colonic diverticulosis without diverticulitis. 5. Aortic Atherosclerosis (ICD10-I70.0).   12/23/2019 Imaging   1. Slight interval increase in size of a mixed solid and cystic nodule in the left hemipelvis measuring 3.0 x 2.9 cm, previously 2.7 x 2.1 cm. 2. Interval decrease in size of a nodule in the left paracolic gutter measuring 1.0 x 0.8 cm, previously 2.1 x 1.6 cm. 3. Stable peritoneal nodules in the hepatorenal recess and at the inferior tip of the spleen. 4. Unchanged nodule or lymph node overlying the left external iliac artery. 5. Enlargement of dominant left pelvic nodule is concerning for disease progression despite interval decrease in size of a nodule in the left paracolic gutter and stability of other nodules. 6. No evidence of metastatic disease in the chest. 7. Stable, benign left adrenal adenoma. 8. Ventral hernia mesh repair with a small component of recurrent hernia inferiorly, containing a single nonobstructed loop  of small bowel. 9. Hepatic steatosis. 10. Aortic Atherosclerosis (ICD10-I70.0).   12/23/2019 Tumor Marker   Patient's tumor was tested for the following markers: Inhibin B Results of the tumor marker test revealed 94.9   01/25/2020 Tumor Marker   Patient's tumor was tested for the following markers: Inhibin B Results of the tumor marker test revealed 116.7   02/20/2020 Genetic Testing   Negative genetic testing: no pathogenic variants detected in [facility] Multi-Cancer Panel.  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  The report date is February 20, 2020.   The Multi-Cancer Panel offered by [facility] includes sequencing and/or deletion duplication testing of the following 85 genes: AIP, ALK, APC, ATM, AXIN2,BAP1,  BARD1, BLM, BMPR1A, BRCA1, BRCA2, BRIP1, CASR, CDC73, CDH1, CDK4, CDKN1B, CDKN1C, CDKN2A (p14ARF), CDKN2A  (p16INK4a), CEBPA, CHEK2, CTNNA1, DICER1, DIS3L2, EGFR (c.2369C>T, p.Thr790Met variant only), EPCAM (Deletion/duplication testing only), FH, FLCN, GATA2, GPC3, GREM1 (Promoter region deletion/duplication testing only), HOXB13 (c.251G>A, p.Gly84Glu), HRAS, KIT, MAX, MEN1, MET, MITF (c.952G>A, p.Glu318Lys variant only), MLH1, MSH2, MSH3, MSH6, MUTYH, NBN, NF1, NF2, NTHL1, PALB2, PDGFRA, PHOX2B, PMS2, POLD1, POLE, POT1, PRKAR1A, PTCH1, PTEN, RAD50, RAD51C, RAD51D, RB1, RECQL4, RET, RNF43, RUNX1, SDHAF2, SDHA (sequence changes only), SDHB, SDHC, SDHD, SMAD4, SMARCA4, SMARCB1, SMARCE1, STK11, SUFU, TERC, TERT, TMEM127, TP53, TSC1, TSC2, VHL, WRN and WT1.    03/02/2020 Tumor Marker   Patient's tumor was tested for the following markers: Inhibin B Results of the tumor marker test revealed 80.8   04/17/2020 Imaging   1. Overall, exam is stable. Multiple peritoneal nodules are again seen. The index nodule in the left lower quadrant is mildly increased in size in the interval. The lesion within the anterior left pelvis has decreased in size in the interval. There has also been decrease in size of cystic lesion within the a hepatorenal recess. The remaining peritoneal lesions are unchanged. No new lesions identified. 2. Stable left adrenal nodule. 3.  Aortic Atherosclerosis (ICD10-I70.0).     08/21/2020 Imaging   Bone density is normal AP spine T score 0.8 Femoral neck on the left, T score -0.2 Femoral neck on the right ,T score -0.4   10/17/2020 Imaging   1. Interval progression of peritoneal nodules along the left pelvic sidewall, concerning for progressive metastatic disease. 2. Small capsular lesion medial right liver and the apparent cystic lesion superior to the right kidney are similar to prior. 3. Tiny soft tissue nodule anterior aspect of the lateral left pelvis described as decreasing on the prior study has decreased further on today's exam. 4. Stable left adrenal nodule. This cannot be  definitively characterized. 5. Tiny nonobstructing stone lower pole right kidney. 6. Aortic Atherosclerosis (ICD10-I70.0).   10/26/2020 Procedure   Successful placement of a right internal jugular approach power injectable Port-A-Cath. The catheter is ready for immediate use.       10/31/2020 -  Chemotherapy    Patient is on Treatment Plan: OVARIAN PACLITAXEL Z1,6,96,78 Q28D         PHYSICAL EXAMINATION: ECOG PERFORMANCE STATUS: 1 - Symptomatic but completely ambulatory  Vitals:   01/08/21 0842  BP: 136/71  Pulse: 72  Resp: 18  Temp: 98.3 F (36.8 C)  SpO2: 98%   Filed Weights   01/08/21 0842  Weight: 189 lb 6.4 oz (85.9 kg)    GENERAL:alert, no distress and comfortable SKIN: skin color, texture, turgor are normal, no rashes or significant lesions EYES: normal, Conjunctiva are pink and non-injected, sclera clear OROPHARYNX:no exudate, no erythema and lips, buccal mucosa,  and tongue normal  NECK: supple, thyroid normal size, non-tender, without nodularity LYMPH:  no palpable lymphadenopathy in the cervical, axillary or inguinal LUNGS: clear to auscultation and percussion with normal breathing effort HEART: regular rate & rhythm and no murmurs and no lower extremity edema ABDOMEN:abdomen soft, non-tender and normal bowel sounds.  Noted well-healed surgical scar.  Stable exam Musculoskeletal:no cyanosis of digits and no clubbing  NEURO: alert & oriented x 3 with fluent speech, no focal motor/sensory deficits  LABORATORY DATA:  I have reviewed the data as listed    Component Value Date/Time   NA 140 01/08/2021 0828   NA 139 11/30/2019 0913   NA 141 09/24/2013 1131   K 4.4 01/08/2021 0828   K 4.5 09/24/2013 1131   CL 104 01/08/2021 0828   CO2 25 01/08/2021 0828   CO2 24 09/24/2013 1131   GLUCOSE 74 01/08/2021 0828   GLUCOSE 90 09/24/2013 1131   BUN 28 (H) 01/08/2021 0828   BUN 32 (H) 11/30/2019 0913   BUN 21.2 09/24/2013 1131   CREATININE 1.27 (H) 01/08/2021  0828   CREATININE 1.1 09/24/2013 1131   CALCIUM 9.8 01/08/2021 0828   CALCIUM 9.5 09/24/2013 1131   PROT 7.1 01/08/2021 0828   PROT 7.0 11/30/2019 0913   PROT 6.8 09/24/2013 1131   ALBUMIN 4.0 01/08/2021 0828   ALBUMIN 4.3 11/30/2019 0913   ALBUMIN 3.9 09/24/2013 1131   AST 20 01/08/2021 0828   AST 23 09/24/2013 1131   ALT 19 01/08/2021 0828   ALT 27 09/24/2013 1131   ALKPHOS 62 01/08/2021 0828   ALKPHOS 81 09/24/2013 1131   BILITOT 0.5 01/08/2021 0828   BILITOT 0.53 09/24/2013 1131   GFRNONAA 47 (L) 01/08/2021 0828   GFRAA 55 (L) 01/24/2020 1530   GFRAA 57 (L) 12/21/2019 1259    No results found for: SPEP, UPEP  Lab Results  Component Value Date   WBC 5.8 01/08/2021   NEUTROABS 3.2 01/08/2021   HGB 13.4 01/08/2021   HCT 39.3 01/08/2021   MCV 94.0 01/08/2021   PLT 298 01/08/2021      Chemistry      Component Value Date/Time   NA 140 01/08/2021 0828   NA 139 11/30/2019 0913   NA 141 09/24/2013 1131   K 4.4 01/08/2021 0828   K 4.5 09/24/2013 1131   CL 104 01/08/2021 0828   CO2 25 01/08/2021 0828   CO2 24 09/24/2013 1131   BUN 28 (H) 01/08/2021 0828   BUN 32 (H) 11/30/2019 0913   BUN 21.2 09/24/2013 1131   CREATININE 1.27 (H) 01/08/2021 0828   CREATININE 1.1 09/24/2013 1131      Component Value Date/Time   CALCIUM 9.8 01/08/2021 0828   CALCIUM 9.5 09/24/2013 1131   ALKPHOS 62 01/08/2021 0828   ALKPHOS 81 09/24/2013 1131   AST 20 01/08/2021 0828   AST 23 09/24/2013 1131   ALT 19 01/08/2021 0828   ALT 27 09/24/2013 1131   BILITOT 0.5 01/08/2021 0828   BILITOT 0.53 09/24/2013 1131

## 2021-01-09 ENCOUNTER — Inpatient Hospital Stay: Payer: 59

## 2021-01-09 VITALS — BP 120/58 | HR 66 | Temp 98.4°F | Resp 18

## 2021-01-09 DIAGNOSIS — R21 Rash and other nonspecific skin eruption: Secondary | ICD-10-CM | POA: Diagnosis not present

## 2021-01-09 DIAGNOSIS — Z7189 Other specified counseling: Secondary | ICD-10-CM

## 2021-01-09 DIAGNOSIS — C569 Malignant neoplasm of unspecified ovary: Secondary | ICD-10-CM | POA: Diagnosis not present

## 2021-01-09 DIAGNOSIS — Z5111 Encounter for antineoplastic chemotherapy: Secondary | ICD-10-CM | POA: Diagnosis not present

## 2021-01-09 DIAGNOSIS — N183 Chronic kidney disease, stage 3 unspecified: Secondary | ICD-10-CM | POA: Diagnosis not present

## 2021-01-09 DIAGNOSIS — Z23 Encounter for immunization: Secondary | ICD-10-CM | POA: Diagnosis not present

## 2021-01-09 MED ORDER — SODIUM CHLORIDE 0.9 % IV SOLN: 5.0000 mg | Freq: Once | INTRAVENOUS | Status: DC

## 2021-01-09 MED ORDER — HEPARIN SOD (PORK) LOCK FLUSH 100 UNIT/ML IV SOLN
500.0000 [IU] | Freq: Once | INTRAVENOUS | Status: AC | PRN
  Administered 2021-01-09: 500 [IU]

## 2021-01-09 MED ORDER — SODIUM CHLORIDE 0.9 % IV SOLN
60.0000 mg/m2 | Freq: Once | INTRAVENOUS | Status: AC
  Administered 2021-01-09: 120 mg via INTRAVENOUS
  Filled 2021-01-09: qty 20

## 2021-01-09 MED ORDER — DEXAMETHASONE SODIUM PHOSPHATE 10 MG/ML IJ SOLN
5.0000 mg | Freq: Once | INTRAMUSCULAR | Status: AC
  Administered 2021-01-09: 5 mg via INTRAVENOUS
  Filled 2021-01-09: qty 1

## 2021-01-09 MED ORDER — SODIUM CHLORIDE 0.9 % IV SOLN: Freq: Once | INTRAVENOUS | Status: AC

## 2021-01-09 MED ORDER — DIPHENHYDRAMINE HCL 50 MG/ML IJ SOLN
25.0000 mg | Freq: Once | INTRAMUSCULAR | Status: AC
  Administered 2021-01-09: 25 mg via INTRAVENOUS
  Filled 2021-01-09: qty 1

## 2021-01-09 MED ORDER — FAMOTIDINE 20 MG IN NS 100 ML IVPB
20.0000 mg | Freq: Once | INTRAVENOUS | Status: AC
  Administered 2021-01-09: 20 mg via INTRAVENOUS
  Filled 2021-01-09: qty 100

## 2021-01-09 MED ORDER — SODIUM CHLORIDE 0.9% FLUSH
10.0000 mL | INTRAVENOUS | Status: DC | PRN
Start: 2021-01-09 — End: 2021-01-09
  Administered 2021-01-09: 10 mL

## 2021-01-09 NOTE — Patient Instructions (Signed)
[facility] ONCOLOGY   Discharge Instructions: Thank you for choosing [facility] to provide your oncology and hematology care.   If you have a lab appointment with the [facility], please go directly to the [facility] and check in at the registration area.   Wear comfortable clothing and clothing appropriate for easy access to any Portacath or PICC line.   We strive to give you quality time with your provider. You may need to reschedule your appointment if you arrive late (15 or more minutes).  Arriving late affects you and other patients whose appointments are after yours.  Also, if you miss three or more appointments without notifying the office, you may be dismissed from the clinic at the provider's discretion.      For prescription refill requests, have your pharmacy contact our office and allow 72 hours for refills to be completed.    Today you received the following chemotherapy and/or immunotherapy agents: Paclitaxel (Taxol)      To help prevent nausea and vomiting after your treatment, we encourage you to take your nausea medication as directed.  BELOW ARE SYMPTOMS THAT SHOULD BE REPORTED IMMEDIATELY: *FEVER GREATER THAN 100.4 F (38 C) OR HIGHER *CHILLS OR SWEATING *NAUSEA AND VOMITING THAT IS NOT CONTROLLED WITH YOUR NAUSEA MEDICATION *UNUSUAL SHORTNESS OF BREATH *UNUSUAL BRUISING OR BLEEDING *URINARY PROBLEMS (pain or burning when urinating, or frequent urination) *BOWEL PROBLEMS (unusual diarrhea, constipation, pain near the anus) TENDERNESS IN MOUTH AND THROAT WITH OR WITHOUT PRESENCE OF ULCERS (sore throat, sores in mouth, or a toothache) UNUSUAL RASH, SWELLING OR PAIN  UNUSUAL VAGINAL DISCHARGE OR ITCHING   Items with * indicate a potential emergency and should be followed up as soon as possible or go to the Emergency Department if any problems should occur.  Please show the CHEMOTHERAPY ALERT CARD or IMMUNOTHERAPY ALERT CARD at  check-in to the Emergency Department and triage nurse.  Should you have questions after your visit or need to cancel or reschedule your appointment, please contact [facility]  Dept: [phone]  and follow the prompts.  Office hours are 8:00 a.m. to 4:30 p.m. Monday - Friday. Please note that voicemails left after 4:00 p.m. may not be returned until the following business day.  We are closed weekends and major holidays. You have access to a nurse at all times for urgent questions. Please call the main number to the clinic Dept: [phone] and follow the prompts.   For any non-urgent questions, you may also contact your provider using MyChart. We now offer e-Visits for anyone 27 and older to request care online for non-urgent symptoms. For details visit [email or web address].   Also download the MyChart app! Go to the app store, search "MyChart", open the app, select [facility], and log in with your MyChart username and password.  Due to Covid, a mask is required upon entering the hospital/clinic. If you do not have a mask, one will be given to you upon arrival. For doctor visits, patients may have 1 support person aged 46 or older with them. For treatment visits, patients cannot have anyone with them due to current Covid guidelines and our immunocompromised population.

## 2021-01-12 ENCOUNTER — Other Ambulatory Visit (HOSPITAL_COMMUNITY): Payer: Self-pay

## 2021-01-12 MED ORDER — OMEPRAZOLE 20 MG PO CPDR
DELAYED_RELEASE_CAPSULE | ORAL | 3 refills | Status: DC
  Filled 2021-04-10: qty 90, 90d supply, fill #0
  Filled 2021-06-29: qty 90, 90d supply, fill #1
  Filled 2021-10-10: qty 90, 90d supply, fill #2
  Filled 2022-01-10: qty 90, 90d supply, fill #3

## 2021-01-12 NOTE — Progress Notes (Signed)
Patient requested copies of MRI and X-rays for pick up.  Pt notified available for pick up.

## 2021-01-15 ENCOUNTER — Other Ambulatory Visit: Payer: Self-pay

## 2021-01-15 ENCOUNTER — Inpatient Hospital Stay: Payer: 59

## 2021-01-15 ENCOUNTER — Telehealth: Payer: Self-pay

## 2021-01-15 DIAGNOSIS — Z23 Encounter for immunization: Secondary | ICD-10-CM | POA: Diagnosis not present

## 2021-01-15 DIAGNOSIS — C569 Malignant neoplasm of unspecified ovary: Secondary | ICD-10-CM

## 2021-01-15 DIAGNOSIS — Z7189 Other specified counseling: Secondary | ICD-10-CM

## 2021-01-15 DIAGNOSIS — Z5111 Encounter for antineoplastic chemotherapy: Secondary | ICD-10-CM | POA: Diagnosis not present

## 2021-01-15 DIAGNOSIS — R21 Rash and other nonspecific skin eruption: Secondary | ICD-10-CM | POA: Diagnosis not present

## 2021-01-15 DIAGNOSIS — N183 Chronic kidney disease, stage 3 unspecified: Secondary | ICD-10-CM | POA: Diagnosis not present

## 2021-01-15 LAB — CBC WITH DIFFERENTIAL (CANCER CENTER ONLY)
Abs Immature Granulocytes: 0.02 10*3/uL (ref 0.00–0.07)
Basophils Absolute: 0.1 10*3/uL (ref 0.0–0.1)
Basophils Relative: 1 %
Eosinophils Absolute: 0.1 10*3/uL (ref 0.0–0.5)
Eosinophils Relative: 2 %
HCT: 38.3 % (ref 36.0–46.0)
Hemoglobin: 13 g/dL (ref 12.0–15.0)
Immature Granulocytes: 0 %
Lymphocytes Relative: 40 %
Lymphs Abs: 2 10*3/uL (ref 0.7–4.0)
MCH: 32.3 pg (ref 26.0–34.0)
MCHC: 33.9 g/dL (ref 30.0–36.0)
MCV: 95 fL (ref 80.0–100.0)
Monocytes Absolute: 0.3 10*3/uL (ref 0.1–1.0)
Monocytes Relative: 6 %
Neutro Abs: 2.5 10*3/uL (ref 1.7–7.7)
Neutrophils Relative %: 51 %
Platelet Count: 319 10*3/uL (ref 150–400)
RBC: 4.03 MIL/uL (ref 3.87–5.11)
RDW: 14.4 % (ref 11.5–15.5)
WBC Count: 4.9 10*3/uL (ref 4.0–10.5)
nRBC: 0 % (ref 0.0–0.2)

## 2021-01-15 LAB — CMP (CANCER CENTER ONLY)
ALT: 15 U/L (ref 0–44)
AST: 16 U/L (ref 15–41)
Albumin: 3.9 g/dL (ref 3.5–5.0)
Alkaline Phosphatase: 61 U/L (ref 38–126)
Anion gap: 9 (ref 5–15)
BUN: 27 mg/dL — ABNORMAL HIGH (ref 8–23)
CO2: 22 mmol/L (ref 22–32)
Calcium: 9.3 mg/dL (ref 8.9–10.3)
Chloride: 107 mmol/L (ref 98–111)
Creatinine: 1.26 mg/dL — ABNORMAL HIGH (ref 0.44–1.00)
GFR, Estimated: 48 mL/min — ABNORMAL LOW (ref >60)
Glucose, Bld: 107 mg/dL — ABNORMAL HIGH (ref 70–99)
Potassium: 4.4 mmol/L (ref 3.5–5.1)
Sodium: 138 mmol/L (ref 135–145)
Total Bilirubin: 0.5 mg/dL (ref 0.3–1.2)
Total Protein: 7 g/dL (ref 6.5–8.1)

## 2021-01-15 NOTE — Telephone Encounter (Signed)
Called to follow up on complaint of diarrhea. She is able to stay hydrated. Per Dr. Alvy Bimler instructed to start taking imodium for diarrhea. She verbralzied understanding and will call the office back if needed.

## 2021-01-16 ENCOUNTER — Other Ambulatory Visit: Payer: Self-pay | Admitting: Hematology and Oncology

## 2021-01-16 ENCOUNTER — Telehealth: Payer: Self-pay

## 2021-01-16 ENCOUNTER — Inpatient Hospital Stay: Payer: 59

## 2021-01-16 ENCOUNTER — Other Ambulatory Visit: Payer: Self-pay

## 2021-01-16 VITALS — BP 128/68 | HR 62 | Temp 98.6°F | Resp 17

## 2021-01-16 DIAGNOSIS — R21 Rash and other nonspecific skin eruption: Secondary | ICD-10-CM | POA: Diagnosis not present

## 2021-01-16 DIAGNOSIS — N183 Chronic kidney disease, stage 3 unspecified: Secondary | ICD-10-CM | POA: Diagnosis not present

## 2021-01-16 DIAGNOSIS — C569 Malignant neoplasm of unspecified ovary: Secondary | ICD-10-CM

## 2021-01-16 DIAGNOSIS — Z5111 Encounter for antineoplastic chemotherapy: Secondary | ICD-10-CM | POA: Diagnosis not present

## 2021-01-16 DIAGNOSIS — Z23 Encounter for immunization: Secondary | ICD-10-CM | POA: Diagnosis not present

## 2021-01-16 MED ORDER — SODIUM CHLORIDE 0.9 % IV SOLN: INTRAVENOUS | Status: AC

## 2021-01-16 MED ORDER — HEPARIN SOD (PORK) LOCK FLUSH 100 UNIT/ML IV SOLN
500.0000 [IU] | Freq: Once | INTRAVENOUS | Status: AC
  Administered 2021-01-16: 500 [IU]

## 2021-01-16 MED ORDER — SODIUM CHLORIDE 0.9 % IV SOLN: Freq: Once | INTRAVENOUS | Status: DC

## 2021-01-16 MED ORDER — SODIUM CHLORIDE 0.9% FLUSH
10.0000 mL | Freq: Once | INTRAVENOUS | Status: AC
  Administered 2021-01-16: 10 mL

## 2021-01-16 NOTE — Telephone Encounter (Signed)
Returned her call. She is feeling very tired and wiped out. Diarrhea is better today. Requesting IV fluids over 1 hour today.  Above info given to Dr. Alvy Bimler and given appt for 1 pm today for IV fluids at [facility]. She is aware of appt time.

## 2021-01-17 ENCOUNTER — Inpatient Hospital Stay: Payer: 59

## 2021-01-17 VITALS — BP 135/65 | HR 58 | Temp 98.4°F | Resp 16 | Wt 188.0 lb

## 2021-01-17 DIAGNOSIS — Z23 Encounter for immunization: Secondary | ICD-10-CM | POA: Diagnosis not present

## 2021-01-17 DIAGNOSIS — R21 Rash and other nonspecific skin eruption: Secondary | ICD-10-CM | POA: Diagnosis not present

## 2021-01-17 DIAGNOSIS — C569 Malignant neoplasm of unspecified ovary: Secondary | ICD-10-CM | POA: Diagnosis not present

## 2021-01-17 DIAGNOSIS — Z5111 Encounter for antineoplastic chemotherapy: Secondary | ICD-10-CM | POA: Diagnosis not present

## 2021-01-17 DIAGNOSIS — Z7189 Other specified counseling: Secondary | ICD-10-CM

## 2021-01-17 DIAGNOSIS — N183 Chronic kidney disease, stage 3 unspecified: Secondary | ICD-10-CM | POA: Diagnosis not present

## 2021-01-17 MED ORDER — SODIUM CHLORIDE 0.9 % IV SOLN
60.0000 mg/m2 | Freq: Once | INTRAVENOUS | Status: AC
  Administered 2021-01-17: 120 mg via INTRAVENOUS
  Filled 2021-01-17: qty 20

## 2021-01-17 MED ORDER — FAMOTIDINE 20 MG IN NS 100 ML IVPB
20.0000 mg | Freq: Once | INTRAVENOUS | Status: AC
  Administered 2021-01-17: 20 mg via INTRAVENOUS
  Filled 2021-01-17: qty 100

## 2021-01-17 MED ORDER — SODIUM CHLORIDE 0.9 % IV SOLN: 5.0000 mg | Freq: Once | INTRAVENOUS | Status: DC

## 2021-01-17 MED ORDER — DEXAMETHASONE SODIUM PHOSPHATE 10 MG/ML IJ SOLN
5.0000 mg | Freq: Once | INTRAMUSCULAR | Status: AC
  Administered 2021-01-17: 5 mg via INTRAVENOUS
  Filled 2021-01-17: qty 1

## 2021-01-17 MED ORDER — INFLUENZA VAC A&B SA ADJ QUAD 0.5 ML IM PRSY
0.5000 mL | PREFILLED_SYRINGE | Freq: Once | INTRAMUSCULAR | Status: AC
  Filled 2021-01-17: qty 0.5

## 2021-01-17 MED ORDER — DIPHENHYDRAMINE HCL 50 MG/ML IJ SOLN
25.0000 mg | Freq: Once | INTRAMUSCULAR | Status: AC
  Administered 2021-01-17: 25 mg via INTRAVENOUS
  Filled 2021-01-17: qty 1

## 2021-01-17 MED ORDER — SODIUM CHLORIDE 0.9% FLUSH
10.0000 mL | INTRAVENOUS | Status: DC | PRN
  Administered 2021-01-17: 10 mL

## 2021-01-17 MED ORDER — INFLUENZA VAC SPLIT QUAD 0.5 ML IM SUSY
PREFILLED_SYRINGE | INTRAMUSCULAR | Status: AC
  Administered 2021-01-17: 0.5 mL via INTRAMUSCULAR
  Filled 2021-01-17: qty 0.5

## 2021-01-17 MED ORDER — HEPARIN SOD (PORK) LOCK FLUSH 100 UNIT/ML IV SOLN
500.0000 [IU] | Freq: Once | INTRAVENOUS | Status: AC | PRN
  Administered 2021-01-17: 500 [IU]

## 2021-01-17 MED ORDER — SODIUM CHLORIDE 0.9 % IV SOLN: Freq: Once | INTRAVENOUS | Status: AC

## 2021-01-17 NOTE — Patient Instructions (Signed)
[facility] ONCOLOGY   Discharge Instructions: Thank you for choosing [facility] to provide your oncology and hematology care.   If you have a lab appointment with the [facility], please go directly to the [facility] and check in at the registration area.   Wear comfortable clothing and clothing appropriate for easy access to any Portacath or PICC line.   We strive to give you quality time with your provider. You may need to reschedule your appointment if you arrive late (15 or more minutes).  Arriving late affects you and other patients whose appointments are after yours.  Also, if you miss three or more appointments without notifying the office, you may be dismissed from the clinic at the provider's discretion.      For prescription refill requests, have your pharmacy contact our office and allow 72 hours for refills to be completed.    Today you received the following chemotherapy and/or immunotherapy agents: Paclitaxel (Taxol)      To help prevent nausea and vomiting after your treatment, we encourage you to take your nausea medication as directed.  BELOW ARE SYMPTOMS THAT SHOULD BE REPORTED IMMEDIATELY: *FEVER GREATER THAN 100.4 F (38 C) OR HIGHER *CHILLS OR SWEATING *NAUSEA AND VOMITING THAT IS NOT CONTROLLED WITH YOUR NAUSEA MEDICATION *UNUSUAL SHORTNESS OF BREATH *UNUSUAL BRUISING OR BLEEDING *URINARY PROBLEMS (pain or burning when urinating, or frequent urination) *BOWEL PROBLEMS (unusual diarrhea, constipation, pain near the anus) TENDERNESS IN MOUTH AND THROAT WITH OR WITHOUT PRESENCE OF ULCERS (sore throat, sores in mouth, or a toothache) UNUSUAL RASH, SWELLING OR PAIN  UNUSUAL VAGINAL DISCHARGE OR ITCHING   Items with * indicate a potential emergency and should be followed up as soon as possible or go to the Emergency Department if any problems should occur.  Please show the CHEMOTHERAPY ALERT CARD or IMMUNOTHERAPY ALERT CARD at  check-in to the Emergency Department and triage nurse.  Should you have questions after your visit or need to cancel or reschedule your appointment, please contact [facility]  Dept: [phone]  and follow the prompts.  Office hours are 8:00 a.m. to 4:30 p.m. Monday - Friday. Please note that voicemails left after 4:00 p.m. may not be returned until the following business day.  We are closed weekends and major holidays. You have access to a nurse at all times for urgent questions. Please call the main number to the clinic Dept: [phone] and follow the prompts.   For any non-urgent questions, you may also contact your provider using MyChart. We now offer e-Visits for anyone 27 and older to request care online for non-urgent symptoms. For details visit [email or web address].   Also download the MyChart app! Go to the app store, search "MyChart", open the app, select [facility], and log in with your MyChart username and password.  Due to Covid, a mask is required upon entering the hospital/clinic. If you do not have a mask, one will be given to you upon arrival. For doctor visits, patients may have 1 support person aged 46 or older with them. For treatment visits, patients cannot have anyone with them due to current Covid guidelines and our immunocompromised population.

## 2021-01-22 ENCOUNTER — Ambulatory Visit: Payer: 59 | Admitting: Neurology

## 2021-01-22 ENCOUNTER — Telehealth: Payer: Self-pay | Admitting: Neurology

## 2021-01-22 ENCOUNTER — Encounter: Payer: Self-pay | Admitting: Neurology

## 2021-01-22 NOTE — Telephone Encounter (Signed)
FYI- Pt had tor reschedule her appt for today due to an accident.

## 2021-01-23 ENCOUNTER — Other Ambulatory Visit (HOSPITAL_COMMUNITY): Payer: Self-pay

## 2021-01-23 ENCOUNTER — Other Ambulatory Visit: Payer: Self-pay

## 2021-01-23 ENCOUNTER — Encounter (INDEPENDENT_AMBULATORY_CARE_PROVIDER_SITE_OTHER): Payer: Self-pay | Admitting: Family Medicine

## 2021-01-23 ENCOUNTER — Encounter (HOSPITAL_COMMUNITY): Payer: Self-pay

## 2021-01-23 ENCOUNTER — Ambulatory Visit (INDEPENDENT_AMBULATORY_CARE_PROVIDER_SITE_OTHER): Payer: 59 | Admitting: Family Medicine

## 2021-01-23 ENCOUNTER — Ambulatory Visit (HOSPITAL_COMMUNITY)
Admission: RE | Admit: 2021-01-23 | Discharge: 2021-01-23 | Disposition: A | Discharge status: Home / Self Care | Payer: 59 | Source: Ambulatory Visit | Attending: Hematology and Oncology | Admitting: Hematology and Oncology

## 2021-01-23 VITALS — BP 130/69 | HR 61 | Temp 97.9°F | Ht 66.0 in | Wt 185.0 lb

## 2021-01-23 DIAGNOSIS — N281 Cyst of kidney, acquired: Secondary | ICD-10-CM | POA: Diagnosis not present

## 2021-01-23 DIAGNOSIS — E669 Obesity, unspecified: Secondary | ICD-10-CM

## 2021-01-23 DIAGNOSIS — C569 Malignant neoplasm of unspecified ovary: Secondary | ICD-10-CM | POA: Diagnosis not present

## 2021-01-23 DIAGNOSIS — Z6833 Body mass index (BMI) 33.0-33.9, adult: Secondary | ICD-10-CM | POA: Diagnosis not present

## 2021-01-23 DIAGNOSIS — Z9189 Other specified personal risk factors, not elsewhere classified: Secondary | ICD-10-CM | POA: Diagnosis not present

## 2021-01-23 DIAGNOSIS — K769 Liver disease, unspecified: Secondary | ICD-10-CM | POA: Diagnosis not present

## 2021-01-23 DIAGNOSIS — F3289 Other specified depressive episodes: Secondary | ICD-10-CM | POA: Diagnosis not present

## 2021-01-23 DIAGNOSIS — K439 Ventral hernia without obstruction or gangrene: Secondary | ICD-10-CM | POA: Diagnosis not present

## 2021-01-23 DIAGNOSIS — E8881 Metabolic syndrome: Secondary | ICD-10-CM | POA: Diagnosis not present

## 2021-01-23 MED ORDER — IOHEXOL 350 MG/ML SOLN
60.0000 mL | Freq: Once | INTRAVENOUS | Status: AC | PRN
  Administered 2021-01-23: 60 mL via INTRAVENOUS

## 2021-01-23 MED ORDER — METFORMIN HCL 500 MG PO TABS
500.0000 mg | ORAL_TABLET | Freq: Two times a day (BID) | ORAL | 0 refills | Status: DC
  Filled 2021-01-23: qty 60, 30d supply, fill #0

## 2021-01-23 MED ORDER — BUPROPION HCL ER (SR) 200 MG PO TB12
200.0000 mg | ORAL_TABLET | Freq: Two times a day (BID) | ORAL | 0 refills | Status: DC
  Filled 2021-01-23: qty 60, 30d supply, fill #0

## 2021-01-24 NOTE — Progress Notes (Signed)
Chief Complaint:   OBESITY [name] is here to discuss her progress with her obesity treatment plan along with follow-up of her obesity related diagnoses. [name] is on the Category 2 Plan or keeping a food journal and adhering to recommended goals of 1300-1400 calories and 90 grams of protein daily and states she is following her eating plan approximately 70% of the time. [name] states she is walking for 25 minutes 1 times per week.  Today's visit was #: 38 Starting weight: 209 lbs Starting date: 04/29/2018 Today's weight: 185 lbs Today's date: 01/23/2021 Total lbs lost to date: 24 lbs Total lbs lost since last in-office visit: 2 lbs  Interim History: [name] is using MFP and likes it a lot. She is hitting her calorie goals most of the days, but she was low in grams of protein for 5 days or so. She notes she has learned a lot.   Subjective:   1. Insulin resistance [name] has a diagnosis of insulin resistance based on her elevated fasting insulin level >5. She continues to work on diet and exercise to decrease her risk of diabetes.  2. Other depression, with emotional eating [name] is struggling with emotional eating and using food for comfort to the extent that it is negatively impacting her health. She has been working on behavior modification techniques to help reduce her emotional eating and has been very successful. She shows no sign of suicidal or homicidal ideations.   3. At risk for malnutrition [name] is at risk for malnutrition due to inadequate protein intake.  Assessment/Plan:  No orders of the defined types were placed in this encounter.   Medications Discontinued During This Encounter  Medication Reason   omeprazole (PRILOSEC) 20 MG capsule Error   buPROPion (WELLBUTRIN SR) 200 MG 12 hr tablet Reorder   metFORMIN (GLUCOPHAGE) 500 MG tablet Reorder     Meds ordered this encounter  Medications   buPROPion (WELLBUTRIN SR) 200 MG 12 hr tablet    Sig: Take 1 tablet by mouth  2 times daily.    Dispense:  60 tablet    Refill:  0    30 d supply; ov for RF   metFORMIN (GLUCOPHAGE) 500 MG tablet    Sig: Take 1 tablet by mouth 2 times daily with a meal.    Dispense:  60 tablet    Refill:  0    30 d supply; ov for RF     1. Insulin resistance [name] will continue to work on weight loss, exercise, and decreasing simple carbohydrates to help decrease the risk of diabetes. We will refill Metformin 500 mg for 1 month. [name] agreed to follow-up with [name] as directed to closely monitor her progress.  - metFORMIN (GLUCOPHAGE) 500 MG tablet; Take 1 tablet by mouth 2 times daily with a meal.  Dispense: 60 tablet; Refill: 0  2. Other depression, with emotional eating Behavior modification techniques were discussed today to help [name] deal with her emotional/non-hunger eating behaviors.  We will refill Wellbutrin 200 mg for 1 month. Orders and follow up as documented in patient record.    - buPROPion (WELLBUTRIN SR) 200 MG 12 hr tablet; Take 1 tablet by mouth 2 times daily.  Dispense: 60 tablet; Refill: 0  3. At risk for malnutrition [name] was given approximately 15 minutes of counseling today regarding prevention of malnutrition and ways to meet macronutrient goals..    4. Obesity with current BMI of 29.9 [name] is currently in the action stage  of change. As such, her goal is to continue with weight loss efforts. She has agreed to the Category 2 Plan or keeping a food journal and adhering to recommended goals of 1300-1400 calories and 90 grams of protein daily.   [name] will bring in her log to next office visit. We reviewed how to use [employer] in the office today on [name]'s phone.  Exercise goals: As is, increase as tolerated.  Behavioral modification strategies: increasing lean protein intake, decreasing simple carbohydrates, and keeping a strict food journal.  [name] has agreed to follow-up with our clinic in 3 weeks. She was informed of the importance of frequent  follow-up visits to maximize her success with intensive lifestyle modifications for her multiple health conditions.   Objective:   Blood pressure 130/69, pulse 61, temperature 97.9 F (36.6 C), height 5\' 6"  (1.676 m), weight 185 lb (83.9 kg), SpO2 98 %. Body mass index is 29.86 kg/m.  General: Cooperative, alert, well developed, in no acute distress. HEENT: Conjunctivae and lids unremarkable. Cardiovascular: Regular rhythm.  Lungs: Normal work of breathing. Neurologic: No focal deficits.   Lab Results  Component Value Date   CREATININE 1.26 (H) 01/15/2021   BUN 27 (H) 01/15/2021   NA 138 01/15/2021   K 4.4 01/15/2021   CL 107 01/15/2021   CO2 22 01/15/2021   Lab Results  Component Value Date   ALT 15 01/15/2021   AST 16 01/15/2021   ALKPHOS 61 01/15/2021   BILITOT 0.5 01/15/2021   Lab Results  Component Value Date   HGBA1C 5.2 11/30/2019   HGBA1C 5.3 05/31/2019   HGBA1C 5.2 11/19/2018   HGBA1C 5.5 04/29/2018   Lab Results  Component Value Date   INSULIN 23.8 11/30/2019   INSULIN 16.5 05/31/2019   INSULIN 17.6 11/19/2018   INSULIN 25.8 (H) 04/29/2018   Lab Results  Component Value Date   TSH 0.707 11/30/2019   Lab Results  Component Value Date   CHOL 224 (H) 11/30/2019   HDL 52 11/30/2019   LDLCALC 136 (H) 11/30/2019   TRIG 201 (H) 11/30/2019   Lab Results  Component Value Date   VD25OH 59.0 11/30/2019   VD25OH 54.0 05/31/2019   VD25OH 42.8 11/19/2018   Lab Results  Component Value Date   WBC 4.9 01/15/2021   HGB 13.0 01/15/2021   HCT 38.3 01/15/2021   MCV 95.0 01/15/2021   PLT 319 01/15/2021   No results found for: IRON, TIBC, FERRITIN  Attestation Statements:   Reviewed by clinician on day of visit: allergies, medications, problem list, medical history, surgical history, family history, social history, and previous encounter notes.  I, Lizbeth Bark, RMA, am acting as Location manager for [employer], DO.   I have reviewed the above  documentation for accuracy and completeness, and I agree with the above. Marjory Sneddon, D.O.  The [place] was signed into law in 2016 which includes the topic of electronic health records.  This provides immediate access to information in MyChart.  This includes consultation notes, operative notes, office notes, lab results and pathology reports.  If you have any questions about what you read please let us know at your next visit so we can discuss your concerns and take corrective action if need be.  We are right here with you.

## 2021-01-25 ENCOUNTER — Encounter: Payer: Self-pay | Admitting: Hematology and Oncology

## 2021-01-25 ENCOUNTER — Inpatient Hospital Stay: Payer: 59 | Attending: Hematology | Admitting: Hematology and Oncology

## 2021-01-25 DIAGNOSIS — R110 Nausea: Secondary | ICD-10-CM | POA: Diagnosis not present

## 2021-01-25 DIAGNOSIS — Z5111 Encounter for antineoplastic chemotherapy: Secondary | ICD-10-CM | POA: Insufficient documentation

## 2021-01-25 DIAGNOSIS — G629 Polyneuropathy, unspecified: Secondary | ICD-10-CM | POA: Diagnosis not present

## 2021-01-25 DIAGNOSIS — I700 Atherosclerosis of aorta: Secondary | ICD-10-CM | POA: Diagnosis not present

## 2021-01-25 DIAGNOSIS — C569 Malignant neoplasm of unspecified ovary: Secondary | ICD-10-CM | POA: Diagnosis not present

## 2021-01-25 DIAGNOSIS — Z79899 Other long term (current) drug therapy: Secondary | ICD-10-CM | POA: Diagnosis not present

## 2021-01-25 NOTE — Progress Notes (Signed)
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   All questions were answered. The patient knows to call the clinic with any problems, questions or concerns. The total time spent in the appointment was 20 minutes encounter with patients including review of chart and various tests results, discussions about plan of care and coordination of care plan   Heath Lark, MD 01/25/2021 1:19 PM  INTERVAL HISTORY: Please see below for problem oriented charting. she returns for treatment follow-up, on single agent Taxol for recurrent granulosa cell tumor of the ovary Since last time I saw her, she is doing well She has occasional intermittent nausea Denies worsening peripheral neuropathy No recent constipation  REVIEW OF SYSTEMS:   Constitutional: Denies fevers, chills or abnormal weight loss Eyes: Denies blurriness of vision Ears, nose, mouth, throat, and face: Denies mucositis or sore throat Respiratory: Denies cough, dyspnea or wheezes Cardiovascular: Denies palpitation, chest discomfort or lower extremity swelling Skin: Denies abnormal skin rashes Lymphatics: Denies new lymphadenopathy or easy bruising Neurological:Denies numbness, tingling or new weaknesses Behavioral/Psych: Mood is stable, no new changes  All other systems were reviewed with the patient and are negative.  I have  reviewed the past medical history, past surgical history, social history and family history with the patient and they are unchanged from previous note.  ALLERGIES:  is allergic to codeine, hydrocodone-acetaminophen, oxycodone, sodium thiosalicylate, thimerosal, tramadol hcl, ciprofloxacin, daypro [oxaprozin], levofloxacin, stadol [butorphanol tartrate], and sulfa antibiotics.  MEDICATIONS:  Current Outpatient Medications  Medication Sig Dispense Refill   acetaminophen (TYLENOL) 500 MG tablet Take 1,000 mg by mouth every 6 (six) hours as needed for moderate pain.     Biotin 5 MG TABS Take 5 mg by mouth daily.     buPROPion (WELLBUTRIN SR) 200 MG 12 hr tablet Take 1 tablet by mouth 2 times daily. 60 tablet 0   Ca Phosphate-Cholecalciferol (EQL CALCIUM GUMMIES) 250-400 MG-UNIT CHEW Chew 1 tablet by mouth 2 (two) times daily.     escitalopram (LEXAPRO) 20 MG tablet TAKE 1 TABLET BY MOUTH ONCE A DAY (Patient taking differently: Take 20 mg by mouth daily. Taking 10 mg daily, temporarily due to LFT'S.) 90 tablet 3   lidocaine-prilocaine (EMLA) cream Apply to affected area once (Patient taking differently: Apply 1 application topically daily as needed (port access).) 30 g 3   LORazepam (ATIVAN) 0.5 MG tablet Take 1 tablet (0.5 mg total) by mouth 2 (two) times daily as needed for anxiety. 30 tablet 0   metFORMIN (GLUCOPHAGE) 500 MG tablet Take 1 tablet by mouth 2 times daily with a meal. 60 tablet 0   mirabegron ER (MYRBETRIQ) 50 MG TB24 tablet Take 1 tablet by mouth once daily 90 tablet 3   Omeprazole 20 MG TBEC Take 1 tablet by mouth once a day. 90 tablet 3   ondansetron (ZOFRAN) 8 MG tablet Take 1 tablet (8 mg total) by mouth every 8 (eight) hours  as needed for nausea or vomiting. 60 tablet 0   Polyethyl Glycol-Propyl Glycol (SYSTANE OP) Place 1 drop into both eyes daily as needed (dry eyes).     Probiotic Product (ALIGN PO) Take 1 tablet by mouth daily.      prochlorperazine (COMPAZINE) 10 MG tablet  Take 10 mg by mouth every 6 (six) hours as needed for nausea or vomiting.     No current facility-administered medications for this visit.    SUMMARY OF ONCOLOGIC HISTORY: Oncology History Overview Note  2018 - Core biopsy for ER/PR and Foundation One testing performed. Unfortunately, no sufficient tissue for Foundation One testing. ER was 50% and PR 90%. Progressed on carboplatin, exemestane, Avastin,Tamoxifen/Megace and letrozole, mixed response on Lupron  [facility]: 06/23/19: 108 05/26/19: 9.04 01/05/19: 6.84 09/02/18: 3.72 06/01/18: 3.84 02/24/18: 2.6 11/21/17: 3.26 08/26/17: 1.77 05/27/17: 2.12 01/28/17: 2.47 06/10/16: 2.68 02/06/16: 1.84 05/12/15: 3.27 10/03/14: 2.12  Inhibin B 09/22/2019: 95.9 05/26/19: 90.2 01/05/19: 92 09/02/18: 70.9 06/04/18: 70 02/24/18: 79.5 11/21/17: 85.8 08/26/17: 65.6 05/27/17: 58.9 01/28/17: 198 06/10/16: 118.1 02/06/16: 62.4 05/12/15: 64.6 10/03/14: 58   Malignant granulosa cell tumor of ovary ([place])  1994 Initial Diagnosis   1994   2002 Relapse/Recurrence   Upper abdominal recurrence, resected.     - 09/2000 Chemotherapy   6 cycles of IP cisplatin and etoposide    02/2009 Relapse/Recurrence   CT showed increased size of nodules in pelvis   2010 Surgery   Exlap with section of tumor nodules near cecum and left pelvic sidewall. Tumor: ER negative, PR positive    Treatment Plan Change   Alternated 2 week courses of Megace and Tamoxifen - ended 03/2012   03/2012 PET scan   CT - progressive disease   2013 Treatment Plan Change   Letrozole   01/2016 Imaging   MRI showed progressive disease   2017 Treatment Plan Change   Two weeks of alternating Tamoxifen 40m daily and then Megace 41mTID   08/2016 Imaging   MRI showed progressive disease with peritoneal implants near liver, in pelvis   01/2017 Treatment Plan Change   Lupron 11.25 q 3 months   11/2017 Imaging   Overall mixed response.   Mixed cystic/solid lesions in the left pelvis are mildly  improved.   Cystic peritoneal disease, including the dominant lesion along the posterior right hepatic lobe, is mildly progressed.   Subcapsular lesion along the posterior right hepatic lobe is unchanged.   03/2018 Imaging   MRI A/P: Mixed response of individual peritoneal metastases in the pelvis, as described above. Overall, there has been no significant change in bulk of disease.   Stable cystic peritoneal metastatic disease along the capsular surfaces of the liver and spleen.   No new sites of metastatic disease identified within the abdomen or pelvis.   08/2018 Imaging   MRI A/P: Status post hysterectomy and bilateral salpingo-oophorectomy.   Mixed cystic/solid peritoneal implants in the abdomen/pelvis, as above. Dominant cystic implant along the posterior liver surface is mildly increased. Remaining lesions are overall grossly unchanged.   No new lesions are identified.   01/28/2019 Imaging   Mri A/P: 1. Relatively similar appearance of peritoneal metastasis. A posterior right hepatic capsular based lesion is similar to minimally decreased in size. Left pelvic implants are primarily similar with possible enlargement of an anterior high left pelvic cystic implant. No new disease identified. 2.  Aortic Atherosclerosis (ICD10-I70.0). 3. Hepatic steatosis. 4. Left adrenal adenoma.   06/02/2019 Imaging   MRI 1. Potential slight  enlargement of dominant cystic area and solid component, associated with rind like signal variation on T2 along the inferior right hepatic margin, also potentially slightly increased. Findings may still be within the realm of measurement and technical variation. Close attention on follow-up. 2. Subtle cystic changes along the cephalad margin of the spleen are difficult to see on previous imaging, perhaps new compared with prior imaging studies. 3. Pelvic implants and left lower quadrant lesion with similar size, of the area along the left iliac vasculature may  be slightly larger than on the prior study. 4. Signs of extensive retroperitoneal and pelvic lymphadenectomy. 5. Hepatic steatosis. 6. 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     06/08/2019 Cancer Staging   Staging form: Ovary, AJCC 7th Edition - Clinical: Stage IIIC (rT2, N1, M0) - Signed by Heath Lark, MD on 06/08/2019   06/10/2019 Imaging   1. 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 These findings are slightly worsened in comparison to a remote prior CT examination dated 10/04/2014.   2.  No evidence of metastatic disease in the chest.   3. Status post hysterectomy, pelvic and retroperitoneal lymph node dissection, and ventral hernia mesh repair.   4.  Hepatic steatosis.   5.  Aortic Atherosclerosis (ICD10-I70.0).   06/24/2019 - 09/02/2019 Chemotherapy   The patient had carboplatin for chemotherapy treatment.     09/02/2019 Tumor Marker   Patient's tumor was tested for the following markers: Inhibin B Results of the tumor marker test revealed 94   09/23/2019 Imaging   1. Interval progression of the soft tissue lesions in the anterior left pelvis, likely peritoneal implants, compatible with disease progression. Remaining sites of apparent disease along the liver capsule and posterior spleen are stable 2. Stable 2 cm left adrenal adenoma. 3. Hepatic steatosis. 4. Right-side predominant colonic diverticulosis without diverticulitis. 5. Aortic Atherosclerosis (ICD10-I70.0).   12/23/2019 Imaging   1. Slight interval increase in size of a mixed solid and cystic nodule in the left hemipelvis measuring 3.0 x 2.9 cm, previously 2.7 x 2.1 cm. 2. Interval decrease in size of a nodule in the left paracolic gutter measuring 1.0 x 0.8 cm, previously 2.1 x 1.6 cm. 3.  Stable peritoneal nodules in the hepatorenal recess and at the inferior tip of the spleen. 4. Unchanged nodule or lymph node overlying the left external iliac artery. 5. Enlargement of dominant left pelvic nodule is concerning for disease progression despite interval decrease in size of a nodule in the left paracolic gutter and stability of other nodules. 6. No evidence of metastatic disease in the chest. 7. Stable, benign left adrenal adenoma. 8. Ventral hernia mesh repair with a small component of recurrent hernia inferiorly, containing a single nonobstructed loop of small bowel. 9. Hepatic steatosis. 10. Aortic Atherosclerosis (ICD10-I70.0).   12/23/2019 Tumor Marker   Patient's tumor was tested for the following markers: Inhibin B Results of the tumor marker test revealed 94.9   01/25/2020 Tumor Marker   Patient's tumor was tested for the following markers: Inhibin B Results of the tumor marker test revealed 116.7   02/20/2020 Genetic Testing   Negative genetic testing: no pathogenic variants detected in [facility] Multi-Cancer Panel.  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  The report date is February 20, 2020.  The Multi-Cancer Panel offered by [facility] includes sequencing and/or deletion duplication testing of the following 85 genes: AIP, ALK, APC, ATM, AXIN2,BAP1,  BARD1, BLM, BMPR1A, BRCA1, BRCA2, BRIP1, CASR, CDC73, CDH1, CDK4, CDKN1B, CDKN1C, CDKN2A (p14ARF), CDKN2A (p16INK4a), CEBPA, CHEK2, CTNNA1, DICER1, DIS3L2, EGFR (c.2369C>T, p.Thr790Met variant only), EPCAM (Deletion/duplication testing only), FH, FLCN, GATA2, GPC3, GREM1 (Promoter region deletion/duplication testing only), HOXB13 (c.251G>A, p.Gly84Glu), HRAS, KIT, MAX, MEN1, MET, MITF (c.952G>A, p.Glu318Lys variant only), MLH1, MSH2, MSH3, MSH6, MUTYH, NBN, NF1, NF2, NTHL1, PALB2, PDGFRA, PHOX2B, PMS2, POLD1, POLE, POT1, PRKAR1A, PTCH1, PTEN, RAD50, RAD51C, RAD51D, RB1, RECQL4, RET, RNF43, RUNX1,  SDHAF2, SDHA (sequence changes only), SDHB, SDHC, SDHD, SMAD4, SMARCA4, SMARCB1, SMARCE1, STK11, SUFU, TERC, TERT, TMEM127, TP53, TSC1, TSC2, VHL, WRN and WT1.    03/02/2020 Tumor Marker   Patient's tumor was tested for the following markers: Inhibin B Results of the tumor marker test revealed 80.8   04/17/2020 Imaging   1. Overall, exam is stable. Multiple peritoneal nodules are again seen. The index nodule in the left lower quadrant is mildly increased in size in the interval. The lesion within the anterior left pelvis has decreased in size in the interval. There has also been decrease in size of cystic lesion within the a hepatorenal recess. The remaining peritoneal lesions are unchanged. No new lesions identified. 2. Stable left adrenal nodule. 3.  Aortic Atherosclerosis (ICD10-I70.0).     08/21/2020 Imaging   Bone density is normal AP spine T score 0.8 Femoral neck on the left, T score -0.2 Femoral neck on the right ,T score -0.4   10/17/2020 Imaging   1. Interval progression of peritoneal nodules along the left pelvic sidewall, concerning for progressive metastatic disease. 2. Small capsular lesion medial right liver and the apparent cystic lesion superior to the right kidney are similar to prior. 3. Tiny soft tissue nodule anterior aspect of the lateral left pelvis described as decreasing on the prior study has decreased further on today's exam. 4. Stable left adrenal nodule. This cannot be definitively characterized. 5. Tiny nonobstructing stone lower pole right kidney. 6. Aortic Atherosclerosis (ICD10-I70.0).   10/26/2020 Procedure   Successful placement of a right internal jugular approach power injectable Port-A-Cath. The catheter is ready for immediate use.       10/31/2020 -  Chemotherapy   Patient is on Treatment Plan : OVARIAN Paclitaxel H0,6,23,76 q28d     01/25/2021 Imaging   1. Signs of peritoneal disease with scattered peritoneal nodules. The index lesions are either  stable or decreased in size in the interval as detailed above. No new sites of disease. 2. Ventral pelvic wall hernia contains a nonobstructed loop of small bowel. 3. Aortic Atherosclerosis (ICD10-I70.0).       PHYSICAL EXAMINATION: ECOG PERFORMANCE STATUS: 1 - Symptomatic but completely ambulatory  Vitals:   01/25/21 1312  BP: 106/75  Pulse: (!) 57  Resp: 18  Temp: (!) 97.5 F (36.4 C)  SpO2: 99%   Filed Weights   01/25/21 1312  Weight: 188 lb (85.3 kg)    GENERAL:alert, no distress and comfortable NEURO: alert & oriented x 3 with fluent speech, no focal motor/sensory deficits  LABORATORY DATA:  I have reviewed the data as listed    Component Value Date/Time   NA 138 01/15/2021 1337   NA 139 11/30/2019 0913   NA 141 09/24/2013 1131   K 4.4 01/15/2021 1337   K 4.5 09/24/2013 1131   CL 107 01/15/2021 1337   CO2 22 01/15/2021 1337   CO2  24 09/24/2013 1131   GLUCOSE 107 (H) 01/15/2021 1337   GLUCOSE 90 09/24/2013 1131   BUN 27 (H) 01/15/2021 1337   BUN 32 (H) 11/30/2019 0913   BUN 21.2 09/24/2013 1131   CREATININE 1.26 (H) 01/15/2021 1337   CREATININE 1.1 09/24/2013 1131   CALCIUM 9.3 01/15/2021 1337   CALCIUM 9.5 09/24/2013 1131   PROT 7.0 01/15/2021 1337   PROT 7.0 11/30/2019 0913   PROT 6.8 09/24/2013 1131   ALBUMIN 3.9 01/15/2021 1337   ALBUMIN 4.3 11/30/2019 0913   ALBUMIN 3.9 09/24/2013 1131   AST 16 01/15/2021 1337   AST 23 09/24/2013 1131   ALT 15 01/15/2021 1337   ALT 27 09/24/2013 1131   ALKPHOS 61 01/15/2021 1337   ALKPHOS 81 09/24/2013 1131   BILITOT 0.5 01/15/2021 1337   BILITOT 0.53 09/24/2013 1131   GFRNONAA 48 (L) 01/15/2021 1337   GFRAA 55 (L) 01/24/2020 1530   GFRAA 57 (L) 12/21/2019 1259    No results found for: SPEP, UPEP  Lab Results  Component Value Date   WBC 4.9 01/15/2021   NEUTROABS 2.5 01/15/2021   HGB 13.0 01/15/2021   HCT 38.3 01/15/2021   MCV 95.0 01/15/2021   PLT 319 01/15/2021      Chemistry      Component  Value Date/Time   NA 138 01/15/2021 1337   NA 139 11/30/2019 0913   NA 141 09/24/2013 1131   K 4.4 01/15/2021 1337   K 4.5 09/24/2013 1131   CL 107 01/15/2021 1337   CO2 22 01/15/2021 1337   CO2 24 09/24/2013 1131   BUN 27 (H) 01/15/2021 1337   BUN 32 (H) 11/30/2019 0913   BUN 21.2 09/24/2013 1131   CREATININE 1.26 (H) 01/15/2021 1337   CREATININE 1.1 09/24/2013 1131      Component Value Date/Time   CALCIUM 9.3 01/15/2021 1337   CALCIUM 9.5 09/24/2013 1131   ALKPHOS 61 01/15/2021 1337   ALKPHOS 81 09/24/2013 1131   AST 16 01/15/2021 1337   AST 23 09/24/2013 1131   ALT 15 01/15/2021 1337   ALT 27 09/24/2013 1131   BILITOT 0.5 01/15/2021 1337   BILITOT 0.53 09/24/2013 1131       RADIOGRAPHIC STUDIES: I have personally reviewed the radiological images as listed and agreed with the findings in the report. CT ABDOMEN PELVIS W CONTRAST  Result Date: 01/25/2021 CLINICAL DATA:  Ovarian cancer, assess treatment response EXAM: CT ABDOMEN AND PELVIS WITH CONTRAST TECHNIQUE: Multidetector CT imaging of the abdomen and pelvis was performed using the standard protocol following bolus administration of intravenous contrast. CONTRAST:  26m OMNIPAQUE IOHEXOL 350 MG/ML SOLN COMPARISON:  CT Abdomen and Pelvis 10/16/2020. CT Abdomen and Pelvis 12/22/2019. FINDINGS: Lower chest: No acute abnormality. Hepatobiliary: No focal liver abnormality. Normal gallbladder. No biliary dilatation. Pancreas: Unremarkable. No pancreatic ductal dilatation or surrounding inflammatory changes. Spleen: Normal in size without focal abnormality. Adrenals/Urinary Tract: Stable left adrenal nodule measuring 1.8 cm. Normal right adrenal gland. Inferior pole left kidney cyst measures 2 cm. Unchanged. Normal appearance of the right kidney. No hydronephrosis identified bilaterally. Bladder unremarkable. Stomach/Bowel: Stomach appears normal. No signs of bowel wall thickening, inflammation, or distension. Vascular/Lymphatic:  Aortic atherosclerosis. Signs of previous left retroperitoneal and left iliac nodal dissection. No abdominopelvic adenopathy identified. Reproductive: Status post hysterectomy.  No adnexal mass. Other: No ascites. Signs of peritoneal disease is again seen with scattered peritoneal nodules including: Cystic lesion between the superior pole of right kidney and medial margin of  the posterior right hepatic lobe measures 3.1 by 3.1 cm, image 22/2. Formally this measured 3.3 x 3.3 cm (when remeasured). subcapsular lesion along the medial margin of the posterior right hepatic lobe is unchanged measuring 1.7 cm, image 20/2. Peritoneal nodule within the distal left pericolic gutter measures 1.3 cm, image 64/2. Formally 1.1 cm. Peritoneal nodule within the left iliac fossa measures 1.3 cm, image 62/2. Formally 2.1 cm. Peritoneal nodule within the left side of pelvis measures 1.6 cm, image 75/2. Formally 2.0 cm. Cystic nodule along the inferior tip of the spleen is unchanged measuring 1.3 cm, image 23/2. Ventral pelvic wall hernia is identified which contains a nonobstructed loop of small bowel, image 73/2. Signs of previous ventral abdominal wall herniorrhaphy. Musculoskeletal: No acute or significant osseous findings. IMPRESSION: 1. Signs of peritoneal disease with scattered peritoneal nodules. The index lesions are either stable or decreased in size in the interval as detailed above. No new sites of disease. 2. Ventral pelvic wall hernia contains a nonobstructed loop of small bowel. 3. Aortic Atherosclerosis (ICD10-I70.0). Electronically Signed   By: Kerby Moors M.D.   On: 01/25/2021 09:42

## 2021-01-25 NOTE — Assessment & Plan Note (Signed)
She has occasional intermittent nausea with vomiting We discussed importance of antiemetic

## 2021-01-25 NOTE — Assessment & Plan Note (Signed)
I have reviewed multiple CT imaging with the patient She is responding well to therapy She has minimum side effects including very mild peripheral neuropathy and intermittent nausea We will continue treatment for few more months with plan to repeat CT imaging again in January of next year

## 2021-01-30 ENCOUNTER — Other Ambulatory Visit (HOSPITAL_COMMUNITY): Payer: Self-pay

## 2021-01-30 ENCOUNTER — Other Ambulatory Visit: Payer: Self-pay

## 2021-01-30 ENCOUNTER — Ambulatory Visit (INDEPENDENT_AMBULATORY_CARE_PROVIDER_SITE_OTHER): Payer: 59 | Admitting: Otolaryngology

## 2021-01-30 DIAGNOSIS — J3481 Nasal mucositis (ulcerative): Secondary | ICD-10-CM | POA: Diagnosis not present

## 2021-01-30 MED ORDER — DOXYCYCLINE HYCLATE 100 MG PO TABS
100.0000 mg | ORAL_TABLET | Freq: Two times a day (BID) | ORAL | 1 refills | Status: DC
  Filled 2021-01-30: qty 20, 10d supply, fill #0

## 2021-01-30 MED ORDER — MUPIROCIN 2 % EX OINT
1.0000 "application " | TOPICAL_OINTMENT | Freq: Two times a day (BID) | CUTANEOUS | 1 refills | Status: DC
  Filled 2021-01-30: qty 22, 5d supply, fill #0

## 2021-01-30 NOTE — Progress Notes (Signed)
HPI: [name] is a 65 y.o. female who presents is referred by her PCP Dr. Laurann Montana for evaluation of chronic nasal complaints she has had for about 9 months now.  She uses nasal CPAP.  But over the past 9 months she has been having crusting scabbing and soreness in her nose.  She has had occasional bleeding and some bloody scabs that she blows from her nose.  She has used saline rinse in the past as well as Flonase but does not like to use the nasal sprays.  She describes tenderness in the nostril upfront but does not really describe pain in the teeth cheek area or forehead.  She does not have any trouble breathing through her nose.Marland Kitchen She wears hearing aids and has occasional itching in her ears.  Past Medical History:  Diagnosis Date   Anxiety    Arthritis    Cervical syndrome    CKD (chronic kidney disease), stage III (HCC)    Constipation    COVID-19 03/26/2019   Family history of colon cancer 02/15/2020   Fatty liver    GERD (gastroesophageal reflux disease)    Granulosa cell tumor of ovary    History of hiatal hernia    Joint pain    Obesity    Osteoarthritis    Ovarian cancer ([place])    PONV (postoperative nausea and vomiting)    history of n/v,  past surgeries no n/v   Sleep apnea    Past Surgical History:  Procedure Laterality Date   ABDOMINAL HYSTERECTOMY     TAH/BSO   APPENDECTOMY     EXPLORATORY LAPAROTOMY     for bowel obstruction   IR IMAGING GUIDED PORT INSERTION  10/25/2020   left toe surgery Left    Secondary tumor debulking  2002   SHOULDER SURGERY     2017 left   Tumor debulking  2010   VENTRAL HERNIA REPAIR     Social History   Socioeconomic History   Marital status: Married    Spouse name: Carita Sollars   Number of children: 2   Years of education: Not on file   Highest education level: Not on file  Occupational History   Occupation: RN  Tobacco Use   Smoking status: Never   Smokeless tobacco: Never  Vaping Use   Vaping Use: Never used  Substance  and Sexual Activity   Alcohol use: Yes    Comment: One 8oz glass, socially   Drug use: No   Sexual activity: Not Currently  Other Topics Concern   Not on file  Social History Narrative   Not on file   Social Determinants of Health   Financial Resource Strain: Not on file  Food Insecurity: Not on file  Transportation Needs: Not on file  Physical Activity: Not on file  Stress: Not on file  Social Connections: Not on file   Family History  Problem Relation Age of Onset   Basal cell carcinoma Mother        79s   Thyroid disease Mother    Stroke Father    Colon cancer Father 60   Hypertension Father    Heart disease Father    Colon cancer Paternal Uncle        dx 70s   Colon cancer Cousin        maternal cousin; dx 79s   Allergies  Allergen Reactions   Codeine Nausea And Vomiting   Hydrocodone-Acetaminophen Itching   Oxycodone Itching   Sodium Thiosalicylate  UNSPECIFIED REACTION    Thimerosal Other (See Comments)    Eye redness    Tramadol Hcl Itching   Ciprofloxacin Other (See Comments)    FATIGUE   Daypro [Oxaprozin] Rash   Levofloxacin Other (See Comments)    fatigue   Stadol [Butorphanol Tartrate] Other (See Comments)    ALTERED MENTAL STATUS   Sulfa Antibiotics Rash   Prior to Admission medications   Medication Sig Start Date End Date Taking? Authorizing Provider  acetaminophen (TYLENOL) 500 MG tablet Take 1,000 mg by mouth every 6 (six) hours as needed for moderate pain.    [provider]  Biotin 5 MG TABS Take 5 mg by mouth daily.    [provider]  buPROPion (WELLBUTRIN SR) 200 MG 12 hr tablet Take 1 tablet by mouth 2 times daily. 01/23/21 01/23/22  Opalski, Neoma Laming, DO  Ca Phosphate-Cholecalciferol (EQL CALCIUM GUMMIES) 250-400 MG-UNIT CHEW Chew 1 tablet by mouth 2 (two) times daily.    [provider]  escitalopram (LEXAPRO) 20 MG tablet TAKE 1 TABLET BY MOUTH ONCE A DAY Patient taking differently: Take 20 mg by mouth  daily. Taking 10 mg daily, temporarily due to LFT'S. 07/03/20 07/03/21  [name], MD  lidocaine-prilocaine (EMLA) cream Apply to affected area once Patient taking differently: Apply 1 application topically daily as needed (port access). 10/17/20   Heath Lark, MD  LORazepam (ATIVAN) 0.5 MG tablet Take 1 tablet (0.5 mg total) by mouth 2 (two) times daily as needed for anxiety. 11/01/20   Heath Lark, MD  metFORMIN (GLUCOPHAGE) 500 MG tablet Take 1 tablet by mouth 2 times daily with a meal. 01/23/21   Opalski, Neoma Laming, DO  mirabegron ER (MYRBETRIQ) 50 MG TB24 tablet Take 1 tablet by mouth once daily 12/11/20     Omeprazole 20 MG TBEC Take 1 tablet by mouth once a day. 01/12/21     ondansetron (ZOFRAN) 8 MG tablet Take 1 tablet (8 mg total) by mouth every 8 (eight) hours as needed for nausea or vomiting. 11/01/20   Heath Lark, MD  Polyethyl Glycol-Propyl Glycol (SYSTANE OP) Place 1 drop into both eyes daily as needed (dry eyes).    [provider]  Probiotic Product (ALIGN PO) Take 1 tablet by mouth daily.     [provider]  prochlorperazine (COMPAZINE) 10 MG tablet Take 10 mg by mouth every 6 (six) hours as needed for nausea or vomiting.    [provider]     Positive ROS: Otherwise negative  All other systems have been reviewed and were otherwise negative with the exception of those mentioned in the HPI and as above.  Physical Exam: Constitutional: Alert, well-appearing, no acute distress Ears: External ears without lesions or tenderness. Ear canals are clear bilaterally with intact, clear TMs bilaterally with no middle ear abnormality noted.  No inflammatory changes of the ear canal. Nasal: External nose without lesions. Septum is relatively midline however she has scabbing and crusting along the anterior septum on both sides slightly worse on the left side.  On nasal endoscopy the middle meatus region is widely patent with no edema or mucopurulent discharge noted.   The nasopharynx is clear with clear eustachian tubes bilaterally.  No purulent discharge noted in the posterior nasal cavity..  Oral: Lips and gums without lesions. Tongue and palate mucosa without lesions. Posterior oropharynx clear. Neck: No palpable adenopathy or masses Respiratory: Breathing comfortably  Skin: No facial/neck lesions or rash noted.  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  Plan: Reviewed with her today that there is no evidence of active sinus infection that most of her problem is secondary to the crusting and scabbing along the septum anteriorly. Because of the soreness placed her on doxycycline 100 mg twice daily for 10 days as the inflammatory changes is most likely secondary to staph. Also recommended use of mupirocin 2% ointment application to the nose twice daily for the next 10days. Suggested using nasal gel on a regular basis as this will provide more moisture to the nasal cavity and this comes in a spray form.  She can use saline nasal rinses as needed.   Radene Journey, MD   CC:

## 2021-02-05 ENCOUNTER — Inpatient Hospital Stay: Payer: 59

## 2021-02-05 ENCOUNTER — Other Ambulatory Visit: Payer: Self-pay

## 2021-02-05 VITALS — BP 132/63 | HR 60 | Temp 97.6°F | Resp 18

## 2021-02-05 DIAGNOSIS — Z79899 Other long term (current) drug therapy: Secondary | ICD-10-CM | POA: Diagnosis not present

## 2021-02-05 DIAGNOSIS — Z7189 Other specified counseling: Secondary | ICD-10-CM

## 2021-02-05 DIAGNOSIS — C569 Malignant neoplasm of unspecified ovary: Secondary | ICD-10-CM | POA: Diagnosis not present

## 2021-02-05 DIAGNOSIS — G629 Polyneuropathy, unspecified: Secondary | ICD-10-CM | POA: Diagnosis not present

## 2021-02-05 DIAGNOSIS — R110 Nausea: Secondary | ICD-10-CM | POA: Diagnosis not present

## 2021-02-05 DIAGNOSIS — I700 Atherosclerosis of aorta: Secondary | ICD-10-CM | POA: Diagnosis not present

## 2021-02-05 DIAGNOSIS — Z5111 Encounter for antineoplastic chemotherapy: Secondary | ICD-10-CM | POA: Diagnosis not present

## 2021-02-05 LAB — CMP (CANCER CENTER ONLY)
ALT: 14 U/L (ref 0–44)
AST: 17 U/L (ref 15–41)
Albumin: 4 g/dL (ref 3.5–5.0)
Alkaline Phosphatase: 73 U/L (ref 38–126)
Anion gap: 12 (ref 5–15)
BUN: 31 mg/dL — ABNORMAL HIGH (ref 8–23)
CO2: 22 mmol/L (ref 22–32)
Calcium: 9.9 mg/dL (ref 8.9–10.3)
Chloride: 105 mmol/L (ref 98–111)
Creatinine: 1.18 mg/dL — ABNORMAL HIGH (ref 0.44–1.00)
GFR, Estimated: 52 mL/min — ABNORMAL LOW (ref >60)
Glucose, Bld: 85 mg/dL (ref 70–99)
Potassium: 4.3 mmol/L (ref 3.5–5.1)
Sodium: 139 mmol/L (ref 135–145)
Total Bilirubin: 0.7 mg/dL (ref 0.3–1.2)
Total Protein: 7.2 g/dL (ref 6.5–8.1)

## 2021-02-05 LAB — CBC WITH DIFFERENTIAL (CANCER CENTER ONLY)
Abs Immature Granulocytes: 0.01 10*3/uL (ref 0.00–0.07)
Basophils Absolute: 0.1 10*3/uL (ref 0.0–0.1)
Basophils Relative: 1 %
Eosinophils Absolute: 0.3 10*3/uL (ref 0.0–0.5)
Eosinophils Relative: 4 %
HCT: 40.4 % (ref 36.0–46.0)
Hemoglobin: 13.6 g/dL (ref 12.0–15.0)
Immature Granulocytes: 0 %
Lymphocytes Relative: 28 %
Lymphs Abs: 1.7 10*3/uL (ref 0.7–4.0)
MCH: 32.2 pg (ref 26.0–34.0)
MCHC: 33.7 g/dL (ref 30.0–36.0)
MCV: 95.5 fL (ref 80.0–100.0)
Monocytes Absolute: 0.9 10*3/uL (ref 0.1–1.0)
Monocytes Relative: 15 %
Neutro Abs: 3 10*3/uL (ref 1.7–7.7)
Neutrophils Relative %: 52 %
Platelet Count: 317 10*3/uL (ref 150–400)
RBC: 4.23 MIL/uL (ref 3.87–5.11)
RDW: 14.3 % (ref 11.5–15.5)
WBC Count: 5.9 10*3/uL (ref 4.0–10.5)
nRBC: 0 % (ref 0.0–0.2)

## 2021-02-05 MED ORDER — FAMOTIDINE 20 MG IN NS 100 ML IVPB
20.0000 mg | Freq: Once | INTRAVENOUS | Status: AC
  Administered 2021-02-05: 20 mg via INTRAVENOUS
  Filled 2021-02-05: qty 100

## 2021-02-05 MED ORDER — SODIUM CHLORIDE 0.9% FLUSH
10.0000 mL | INTRAVENOUS | Status: DC | PRN
  Administered 2021-02-05: 10 mL

## 2021-02-05 MED ORDER — DEXAMETHASONE SODIUM PHOSPHATE 10 MG/ML IJ SOLN
5.0000 mg | Freq: Once | INTRAMUSCULAR | Status: AC
  Administered 2021-02-05: 5 mg via INTRAVENOUS
  Filled 2021-02-05: qty 1

## 2021-02-05 MED ORDER — DIPHENHYDRAMINE HCL 50 MG/ML IJ SOLN
25.0000 mg | Freq: Once | INTRAMUSCULAR | Status: AC
  Administered 2021-02-05: 25 mg via INTRAVENOUS
  Filled 2021-02-05: qty 1

## 2021-02-05 MED ORDER — SODIUM CHLORIDE 0.9 % IV SOLN
60.0000 mg/m2 | Freq: Once | INTRAVENOUS | Status: AC
  Administered 2021-02-05: 120 mg via INTRAVENOUS
  Filled 2021-02-05: qty 20

## 2021-02-05 MED ORDER — HEPARIN SOD (PORK) LOCK FLUSH 100 UNIT/ML IV SOLN
500.0000 [IU] | Freq: Once | INTRAVENOUS | Status: AC | PRN
  Administered 2021-02-05: 500 [IU]

## 2021-02-05 MED ORDER — SODIUM CHLORIDE 0.9 % IV SOLN: Freq: Once | INTRAVENOUS | Status: AC

## 2021-02-05 NOTE — Patient Instructions (Signed)
[facility]   Discharge Instructions: Thank you for choosing [facility] to provide your oncology and hematology care.   If you have a lab appointment with the [facility], please go directly to the [facility] and check in at the registration area.   Wear comfortable clothing and clothing appropriate for easy access to any Portacath or PICC line.   We strive to give you quality time with your provider. You may need to reschedule your appointment if you arrive late (15 or more minutes).  Arriving late affects you and other patients whose appointments are after yours.  Also, if you miss three or more appointments without notifying the office, you may be dismissed from the clinic at the provider's discretion.      For prescription refill requests, have your pharmacy contact our office and allow 72 hours for refills to be completed.    Today you received the following chemotherapy and/or immunotherapy agents: Paclitaxel (Taxol).      To help prevent nausea and vomiting after your treatment, we encourage you to take your nausea medication as directed.  BELOW ARE SYMPTOMS THAT SHOULD BE REPORTED IMMEDIATELY: *FEVER GREATER THAN 100.4 F (38 C) OR HIGHER *CHILLS OR SWEATING *NAUSEA AND VOMITING THAT IS NOT CONTROLLED WITH YOUR NAUSEA MEDICATION *UNUSUAL SHORTNESS OF BREATH *UNUSUAL BRUISING OR BLEEDING *URINARY PROBLEMS (pain or burning when urinating, or frequent urination) *BOWEL PROBLEMS (unusual diarrhea, constipation, pain near the anus) TENDERNESS IN MOUTH AND THROAT WITH OR WITHOUT PRESENCE OF ULCERS (sore throat, sores in mouth, or a toothache) UNUSUAL RASH, SWELLING OR PAIN  UNUSUAL VAGINAL DISCHARGE OR ITCHING   Items with * indicate a potential emergency and should be followed up as soon as possible or go to the Emergency Department if any problems should occur.  Please show the CHEMOTHERAPY ALERT CARD or IMMUNOTHERAPY ALERT CARD at  check-in to the Emergency Department and triage nurse.  Should you have questions after your visit or need to cancel or reschedule your appointment, please contact [facility]  Dept: [phone]  and follow the prompts.  Office hours are 8:00 a.m. to 4:30 p.m. Monday - Friday. Please note that voicemails left after 4:00 p.m. may not be returned until the following business day.  We are closed weekends and major holidays. You have access to a nurse at all times for urgent questions. Please call the main number to the clinic Dept: [phone] and follow the prompts.   For any non-urgent questions, you may also contact your provider using MyChart. We now offer e-Visits for anyone 59 and older to request care online for non-urgent symptoms. For details visit [email or web address].   Also download the MyChart app! Go to the app store, search "MyChart", open the app, select [facility], and log in with your MyChart username and password.  Due to Covid, a mask is required upon entering the hospital/clinic. If you do not have a mask, one will be given to you upon arrival. For doctor visits, patients may have 1 support person aged 57 or older with them. For treatment visits, patients cannot have anyone with them due to current Covid guidelines and our immunocompromised population.

## 2021-02-12 ENCOUNTER — Inpatient Hospital Stay: Payer: 59

## 2021-02-12 ENCOUNTER — Other Ambulatory Visit: Payer: Self-pay

## 2021-02-12 VITALS — BP 122/71 | HR 63 | Temp 97.8°F | Resp 16

## 2021-02-12 DIAGNOSIS — Z7189 Other specified counseling: Secondary | ICD-10-CM

## 2021-02-12 DIAGNOSIS — Z5111 Encounter for antineoplastic chemotherapy: Secondary | ICD-10-CM | POA: Diagnosis not present

## 2021-02-12 DIAGNOSIS — I700 Atherosclerosis of aorta: Secondary | ICD-10-CM | POA: Diagnosis not present

## 2021-02-12 DIAGNOSIS — R110 Nausea: Secondary | ICD-10-CM | POA: Diagnosis not present

## 2021-02-12 DIAGNOSIS — G629 Polyneuropathy, unspecified: Secondary | ICD-10-CM | POA: Diagnosis not present

## 2021-02-12 DIAGNOSIS — C569 Malignant neoplasm of unspecified ovary: Secondary | ICD-10-CM

## 2021-02-12 DIAGNOSIS — Z79899 Other long term (current) drug therapy: Secondary | ICD-10-CM | POA: Diagnosis not present

## 2021-02-12 LAB — CBC WITH DIFFERENTIAL (CANCER CENTER ONLY)
Abs Immature Granulocytes: 0.02 10*3/uL (ref 0.00–0.07)
Basophils Absolute: 0.1 10*3/uL (ref 0.0–0.1)
Basophils Relative: 1 %
Eosinophils Absolute: 0.4 10*3/uL (ref 0.0–0.5)
Eosinophils Relative: 8 %
HCT: 37 % (ref 36.0–46.0)
Hemoglobin: 12.6 g/dL (ref 12.0–15.0)
Immature Granulocytes: 0 %
Lymphocytes Relative: 33 %
Lymphs Abs: 1.9 10*3/uL (ref 0.7–4.0)
MCH: 32.6 pg (ref 26.0–34.0)
MCHC: 34.1 g/dL (ref 30.0–36.0)
MCV: 95.9 fL (ref 80.0–100.0)
Monocytes Absolute: 0.3 10*3/uL (ref 0.1–1.0)
Monocytes Relative: 6 %
Neutro Abs: 2.9 10*3/uL (ref 1.7–7.7)
Neutrophils Relative %: 52 %
Platelet Count: 280 10*3/uL (ref 150–400)
RBC: 3.86 MIL/uL — ABNORMAL LOW (ref 3.87–5.11)
RDW: 13.8 % (ref 11.5–15.5)
WBC Count: 5.6 10*3/uL (ref 4.0–10.5)
nRBC: 0 % (ref 0.0–0.2)

## 2021-02-12 LAB — CMP (CANCER CENTER ONLY)
ALT: 16 U/L (ref 0–44)
AST: 21 U/L (ref 15–41)
Albumin: 3.9 g/dL (ref 3.5–5.0)
Alkaline Phosphatase: 58 U/L (ref 38–126)
Anion gap: 8 (ref 5–15)
BUN: 28 mg/dL — ABNORMAL HIGH (ref 8–23)
CO2: 23 mmol/L (ref 22–32)
Calcium: 9.1 mg/dL (ref 8.9–10.3)
Chloride: 107 mmol/L (ref 98–111)
Creatinine: 0.93 mg/dL (ref 0.44–1.00)
GFR, Estimated: >60 mL/min (ref >60)
Glucose, Bld: 93 mg/dL (ref 70–99)
Potassium: 4.5 mmol/L (ref 3.5–5.1)
Sodium: 138 mmol/L (ref 135–145)
Total Bilirubin: 0.5 mg/dL (ref 0.3–1.2)
Total Protein: 7 g/dL (ref 6.5–8.1)

## 2021-02-12 MED ORDER — SODIUM CHLORIDE 0.9% FLUSH: 10.0000 mL | INTRAVENOUS | Status: DC | PRN

## 2021-02-12 MED ORDER — FAMOTIDINE 20 MG IN NS 100 ML IVPB
20.0000 mg | Freq: Once | INTRAVENOUS | Status: AC
  Administered 2021-02-12: 20 mg via INTRAVENOUS
  Filled 2021-02-12: qty 100

## 2021-02-12 MED ORDER — SODIUM CHLORIDE 0.9 % IV SOLN
60.0000 mg/m2 | Freq: Once | INTRAVENOUS | Status: AC
  Administered 2021-02-12: 120 mg via INTRAVENOUS
  Filled 2021-02-12: qty 20

## 2021-02-12 MED ORDER — DIPHENHYDRAMINE HCL 50 MG/ML IJ SOLN
25.0000 mg | Freq: Once | INTRAMUSCULAR | Status: AC
  Administered 2021-02-12: 25 mg via INTRAVENOUS
  Filled 2021-02-12: qty 1

## 2021-02-12 MED ORDER — HEPARIN SOD (PORK) LOCK FLUSH 100 UNIT/ML IV SOLN
500.0000 [IU] | Freq: Once | INTRAVENOUS | Status: DC | PRN
Start: 2021-02-12 — End: 2021-02-12

## 2021-02-12 MED ORDER — SODIUM CHLORIDE 0.9 % IV SOLN: Freq: Once | INTRAVENOUS | Status: AC

## 2021-02-12 MED ORDER — DEXAMETHASONE SODIUM PHOSPHATE 10 MG/ML IJ SOLN
5.0000 mg | Freq: Once | INTRAMUSCULAR | Status: AC
  Administered 2021-02-12: 5 mg via INTRAVENOUS
  Filled 2021-02-12: qty 1

## 2021-02-12 NOTE — Patient Instructions (Signed)
[facility]   Discharge Instructions: Thank you for choosing [facility] to provide your oncology and hematology care.   If you have a lab appointment with the Cancer Center, please go directly to the Cancer Center and check in at the registration area.   Wear comfortable clothing and clothing appropriate for easy access to any Portacath or PICC line.   We strive to give you quality time with your provider. You may need to reschedule your appointment if you arrive late (15 or more minutes).  Arriving late affects you and other patients whose appointments are after yours.  Also, if you miss three or more appointments without notifying the office, you may be dismissed from the clinic at the provider's discretion.      For prescription refill requests, have your pharmacy contact our office and allow 72 hours for refills to be completed.    Today you received the following chemotherapy and/or immunotherapy agents: paclitaxel.      To help prevent nausea and vomiting after your treatment, we encourage you to take your nausea medication as directed.  BELOW ARE SYMPTOMS THAT SHOULD BE REPORTED IMMEDIATELY: *FEVER GREATER THAN 100.4 F (38 C) OR HIGHER *CHILLS OR SWEATING *NAUSEA AND VOMITING THAT IS NOT CONTROLLED WITH YOUR NAUSEA MEDICATION *UNUSUAL SHORTNESS OF BREATH *UNUSUAL BRUISING OR BLEEDING *URINARY PROBLEMS (pain or burning when urinating, or frequent urination) *BOWEL PROBLEMS (unusual diarrhea, constipation, pain near the anus) TENDERNESS IN MOUTH AND THROAT WITH OR WITHOUT PRESENCE OF ULCERS (sore throat, sores in mouth, or a toothache) UNUSUAL RASH, SWELLING OR PAIN  UNUSUAL VAGINAL DISCHARGE OR ITCHING   Items with * indicate a potential emergency and should be followed up as soon as possible or go to the Emergency Department if any problems should occur.  Please show the CHEMOTHERAPY ALERT CARD or IMMUNOTHERAPY ALERT CARD at check-in  to the Emergency Department and triage nurse.  Should you have questions after your visit or need to cancel or reschedule your appointment, please contact  [facility]  Dept: [phone]  and follow the prompts.  Office hours are 8:00 a.m. to 4:30 p.m. Monday - Friday. Please note that voicemails left after 4:00 p.m. may not be returned until the following business day.  We are closed weekends and major holidays. You have access to a nurse at all times for urgent questions. Please call the main number to the clinic Dept: [phone] and follow the prompts.   For any non-urgent questions, you may also contact your provider using MyChart. We now offer e-Visits for anyone 18 and older to request care online for non-urgent symptoms. For details visit [email or web address].   Also download the MyChart app! Go to the app store, search "MyChart", open the app, select , and log in with your MyChart username and password.  Due to Covid, a mask is required upon entering the hospital/clinic. If you do not have a mask, one will be given to you upon arrival. For doctor visits, patients may have 1 support person aged 18 or older with them. For treatment visits, patients cannot have anyone with them due to current Covid guidelines and our immunocompromised population.   

## 2021-02-13 ENCOUNTER — Other Ambulatory Visit: Payer: Self-pay

## 2021-02-16 DIAGNOSIS — M25552 Pain in left hip: Secondary | ICD-10-CM | POA: Diagnosis not present

## 2021-02-19 DIAGNOSIS — H524 Presbyopia: Secondary | ICD-10-CM | POA: Diagnosis not present

## 2021-02-19 DIAGNOSIS — H02831 Dermatochalasis of right upper eyelid: Secondary | ICD-10-CM | POA: Diagnosis not present

## 2021-02-19 DIAGNOSIS — H2513 Age-related nuclear cataract, bilateral: Secondary | ICD-10-CM | POA: Diagnosis not present

## 2021-02-19 DIAGNOSIS — H35372 Puckering of macula, left eye: Secondary | ICD-10-CM | POA: Diagnosis not present

## 2021-02-19 DIAGNOSIS — H04123 Dry eye syndrome of bilateral lacrimal glands: Secondary | ICD-10-CM | POA: Diagnosis not present

## 2021-02-20 DIAGNOSIS — M1612 Unilateral primary osteoarthritis, left hip: Secondary | ICD-10-CM | POA: Diagnosis not present

## 2021-02-20 DIAGNOSIS — M25552 Pain in left hip: Secondary | ICD-10-CM | POA: Diagnosis not present

## 2021-02-21 ENCOUNTER — Encounter (INDEPENDENT_AMBULATORY_CARE_PROVIDER_SITE_OTHER): Payer: Self-pay

## 2021-02-21 ENCOUNTER — Encounter: Payer: Self-pay | Admitting: Hematology and Oncology

## 2021-02-22 ENCOUNTER — Telehealth: Payer: Self-pay

## 2021-02-22 ENCOUNTER — Encounter (INDEPENDENT_AMBULATORY_CARE_PROVIDER_SITE_OTHER): Payer: Self-pay | Admitting: Family Medicine

## 2021-02-22 ENCOUNTER — Other Ambulatory Visit: Payer: Self-pay

## 2021-02-22 ENCOUNTER — Other Ambulatory Visit (HOSPITAL_COMMUNITY): Payer: Self-pay

## 2021-02-22 ENCOUNTER — Ambulatory Visit (INDEPENDENT_AMBULATORY_CARE_PROVIDER_SITE_OTHER): Payer: 59 | Admitting: Family Medicine

## 2021-02-22 VITALS — BP 136/75 | HR 62 | Temp 98.0°F | Ht 66.0 in | Wt 187.0 lb

## 2021-02-22 DIAGNOSIS — F3289 Other specified depressive episodes: Secondary | ICD-10-CM

## 2021-02-22 DIAGNOSIS — Z9189 Other specified personal risk factors, not elsewhere classified: Secondary | ICD-10-CM

## 2021-02-22 DIAGNOSIS — Z6833 Body mass index (BMI) 33.0-33.9, adult: Secondary | ICD-10-CM

## 2021-02-22 DIAGNOSIS — R4589 Other symptoms and signs involving emotional state: Secondary | ICD-10-CM

## 2021-02-22 DIAGNOSIS — E8881 Metabolic syndrome: Secondary | ICD-10-CM | POA: Diagnosis not present

## 2021-02-22 DIAGNOSIS — E669 Obesity, unspecified: Secondary | ICD-10-CM | POA: Diagnosis not present

## 2021-02-22 MED ORDER — METFORMIN HCL 500 MG PO TABS
ORAL_TABLET | ORAL | 0 refills | Status: DC
  Filled 2021-02-22: qty 90, 30d supply, fill #0

## 2021-02-22 MED ORDER — BUPROPION HCL ER (SR) 200 MG PO TB12
200.0000 mg | ORAL_TABLET | Freq: Two times a day (BID) | ORAL | 0 refills | Status: DC
  Filled 2021-02-22: qty 60, 30d supply, fill #0

## 2021-02-22 NOTE — Telephone Encounter (Signed)
Spoke with her regarding [name]. She has a scheduling conflict and has appt tomorrow at 9 am. She needs to move all appts to next week at [facility]. Appts moved and given a copy of schedule. She will go to scheduling to reschedule next treatment for the week of 11/16.

## 2021-02-23 ENCOUNTER — Inpatient Hospital Stay: Payer: 59

## 2021-02-23 ENCOUNTER — Inpatient Hospital Stay: Payer: 59 | Admitting: Hematology and Oncology

## 2021-02-23 ENCOUNTER — Encounter: Payer: Self-pay | Admitting: Neurology

## 2021-02-23 ENCOUNTER — Ambulatory Visit (INDEPENDENT_AMBULATORY_CARE_PROVIDER_SITE_OTHER): Payer: 59 | Admitting: Neurology

## 2021-02-23 VITALS — BP 150/88 | HR 78 | Ht 66.0 in | Wt 190.0 lb

## 2021-02-23 DIAGNOSIS — R251 Tremor, unspecified: Secondary | ICD-10-CM | POA: Diagnosis not present

## 2021-02-23 DIAGNOSIS — M542 Cervicalgia: Secondary | ICD-10-CM

## 2021-02-23 NOTE — Progress Notes (Signed)
Chief Complaint  Patient presents with   New Patient (Initial Visit)    Room 15 - alone. Referred from [facility]. Here for neck pain radiating into her bilateral shoulders. Also, newer onset of tremors in her hands (right side worse than left). She was having headaches but they have resolved. Completed cervical MRI in July 2022.      ASSESSMENT AND PLAN  [name] is a 65 y.o. female   Right nuchal area musculoskeletal pain  Suggested neck stretching exercise, warm compression, muscle relaxant, Tylenol as needed, massage,  Significant tenderness at right nuchal area, consistent with musculoskeletal etiology, performed trigger point injection today, see separate procedure note Intermittent right hand paresthesia  Right cervical radiculopathy versus right carpal tunnel syndrome,  I have suggested wrist splint, continue monitor symptoms, may call clinic for worsening symptoms, will consider EMG nerve conduction study then  Action tremor  No parkinsonian features,  No family history of tremor,  TSH to rule out thyroid malfunction   DIAGNOSTIC DATA (LABS, IMAGING, TESTING) - I reviewed patient records, labs, notes, testing and imaging myself where available. Personally reviewed MRI of cervical spine October 20, 2020   At C5-6 there is a broad-based disc osteophyte complex. Bilateral uncovertebral degenerative changes. Mild left foraminal stenosis. Severe right foraminal stenosis. Mild spinal stenosis. 2. At C6-7 there is a broad-based disc osteophyte complex. Bilateral uncovertebral degenerative changes. Mild left foraminal stenosis. Severe right foraminal stenosis. 3. No aggressive osseous lesion to suggest malignancy/metastatic disease.  MEDICAL HISTORY:  [name], is a 65 year old female, seen in request by orthopedic surgeon Dr. Susa Day, for evaluation of neck pain, intermittent hand tremor, her primary care physician is Dr. Initial evaluation was on February 23, 2021 Jacqueline Orn, MD   I reviewed and summarized the referring note. PMHx. Insulin resistance. Anxiety  Since February 2022, after she fell on ice, began to noticed neck pain, more on the right side, initially, she has difficulty turning her head, did receive some physical therapy with some improvement, not a candidate for NSAIDs due to abnormal kidney function, Tylenol provide limited help  She was seen by orthopedic surgeon Dr.Brooks, MRI of cervical spine, we personally reviewed the film, multilevel degenerative changes, most noticeable at C5-6, C6-7, no significant canal stenosis, severe right foraminal stenosis  Since beginning of 2022, she also noticed intermittent right hand numbness, especially when she sleeps on her right side, occasionally bilateral hands tremor, denies family history of tremor,   PHYSICAL EXAM:   Vitals:   02/23/21 0843  BP: (!) 150/88  Pulse: 78  Weight: 190 lb (86.2 kg)  Height: 5\' 6"  (1.676 m)   Not recorded     Body mass index is 30.67 kg/m.  PHYSICAL EXAMNIATION:  Gen: NAD, conversant, well nourised, well groomed                     Cardiovascular: Regular rate rhythm, no peripheral edema, warm, nontender. Eyes: Conjunctivae clear without exudates or hemorrhage Neck: Supple, no carotid bruits. Pulmonary: Clear to auscultation bilaterally   NEUROLOGICAL EXAM:  MENTAL STATUS: Speech:    Speech is normal; fluent and spontaneous with normal comprehension.  Cognition:     Orientation to time, place and person     Normal recent and remote memory     Normal Attention span and concentration     Normal Language, naming, repeating,spontaneous speech     Fund of knowledge   CRANIAL NERVES: CN II: Visual  fields are full to confrontation. Pupils are round equal and briskly reactive to light. CN III, IV, VI: extraocular movement are normal. No ptosis. CN V: Facial sensation is intact to light touch CN VII: Face is symmetric with normal eye  closure  CN VIII: Hearing is normal to causal conversation. CN IX, X: Phonation is normal. CN XI: Head turning and shoulder shrug are intact  MOTOR: There was no significant muscle weakness at bilateral upper and lower extremity proximal and distal muscles  Significant right nuchal area pain upon deep palpitation  REFLEXES: Reflexes are 2+ and symmetric at the biceps, triceps, knees, and ankles. Plantar responses are flexor.  SENSORY: Intact to light touch, pinprick and vibratory sensation are intact in fingers and toes.  COORDINATION: There is no trunk or limb dysmetria noted.  GAIT/STANCE: Posture is normal. Gait is steady with normal steps, base, arm swing, and turning. Heel and toe walking are normal. Tandem gait is normal.  Romberg is absent.  REVIEW OF SYSTEMS:  Full 14 system review of systems performed and notable only for as above All other review of systems were negative.   ALLERGIES: Allergies  Allergen Reactions   Codeine Nausea And Vomiting   Hydrocodone-Acetaminophen Itching   Oxycodone Itching   Thimerosal Other (See Comments)    Eye redness    Tramadol Hcl Itching   Ciprofloxacin Other (See Comments)    FATIGUE   Daypro [Oxaprozin] Rash   Levofloxacin Other (See Comments)    fatigue   Stadol [Butorphanol Tartrate] Other (See Comments)    ALTERED MENTAL STATUS   Sulfa Antibiotics Rash    HOME MEDICATIONS: Current Outpatient Medications  Medication Sig Dispense Refill   acetaminophen (TYLENOL) 500 MG tablet Take 1,000 mg by mouth every 6 (six) hours as needed for moderate pain.     Biotin 5 MG TABS Take 5 mg by mouth daily.     buPROPion (WELLBUTRIN SR) 200 MG 12 hr tablet Take 1 tablet by mouth 2 times daily. (need office visit for refills) 60 tablet 0   Ca Phosphate-Cholecalciferol (EQL CALCIUM GUMMIES) 250-400 MG-UNIT CHEW Chew 1 tablet by mouth 2 (two) times daily.     escitalopram (LEXAPRO) 20 MG tablet TAKE 1 TABLET BY MOUTH ONCE A DAY (Patient  taking differently: Take 20 mg by mouth daily. Taking 10 mg daily, temporarily due to LFT'S.) 90 tablet 3   lidocaine-prilocaine (EMLA) cream Apply to affected area once (Patient taking differently: Apply 1 application topically daily as needed (port access).) 30 g 3   LORazepam (ATIVAN) 0.5 MG tablet Take 1 tablet (0.5 mg total) by mouth 2 (two) times daily as needed for anxiety. 30 tablet 0   metFORMIN (GLUCOPHAGE) 500 MG tablet Take 1 tablet by mouth every morning and 2 tablets every evening as directed (need office visit for refills) 90 tablet 0   mirabegron ER (MYRBETRIQ) 50 MG TB24 tablet Take 1 tablet by mouth once daily 90 tablet 3   mupirocin ointment (BACTROBAN) 2 % Apply to the inside of each nostril twice daily for five (5) days. After application, press sides of nose together and gently massage. 22 g 1   Omeprazole 20 MG TBEC Take 1 tablet by mouth once a day. 90 tablet 3   ondansetron (ZOFRAN) 8 MG tablet Take 1 tablet (8 mg total) by mouth every 8 (eight) hours as needed for nausea or vomiting. 60 tablet 0   Polyethyl Glycol-Propyl Glycol (SYSTANE OP) Place 1 drop into both eyes daily as  needed (dry eyes).     Prenatal MV & Min w/FA-DHA (PRENATAL GUMMIES) 0.18-25 MG CHEW      Probiotic Product (ALIGN PO) Take 1 tablet by mouth daily.      prochlorperazine (COMPAZINE) 10 MG tablet Take 10 mg by mouth every 6 (six) hours as needed for nausea or vomiting.     No current facility-administered medications for this visit.    PAST MEDICAL HISTORY: Past Medical History:  Diagnosis Date   Anxiety    Arthritis    Cervical syndrome    CKD (chronic kidney disease), stage III (HCC)    Constipation    COVID-19 03/26/2019   Family history of colon cancer 02/15/2020   Fatty liver    GERD (gastroesophageal reflux disease)    Granulosa cell tumor of ovary    History of hiatal hernia    Joint pain    Neck pain    Obesity    Osteoarthritis    Ovarian cancer (HCC)    PONV (postoperative  nausea and vomiting)    history of n/v,  past surgeries no n/v   Sleep apnea    Tremor     PAST SURGICAL HISTORY: Past Surgical History:  Procedure Laterality Date   ABDOMINAL HYSTERECTOMY     TAH/BSO   APPENDECTOMY     EXPLORATORY LAPAROTOMY     for bowel obstruction   IR IMAGING GUIDED PORT INSERTION  10/25/2020   left toe surgery Left    Secondary tumor debulking  2002   SHOULDER SURGERY     2017 left   Tumor debulking  2010   VENTRAL HERNIA REPAIR      FAMILY HISTORY: Family History  Problem Relation Age of Onset   Basal cell carcinoma Mother        42s   Thyroid disease Mother    Stroke Father    Colon cancer Father 73   Hypertension Father    Heart disease Father    Colon cancer Paternal Uncle        dx 18s   Colon cancer Cousin        maternal cousin; dx 56s    SOCIAL HISTORY: Social History   Socioeconomic History   Marital status: Married    Spouse name: Shiya Fogelman   Number of children: 2   Years of education: college   Highest education level: Not on file  Occupational History   Occupation: RN  Tobacco Use   Smoking status: Never   Smokeless tobacco: Never  Vaping Use   Vaping Use: Never used  Substance and Sexual Activity   Alcohol use: Yes    Comment: One 8oz glass, socially   Drug use: No   Sexual activity: Not Currently  Other Topics Concern   Not on file  Social History Narrative   Lives at home with husband.   Right-handed.   One cup caffeine daily.   Social Determinants of Health   Financial Resource Strain: Not on file  Food Insecurity: Not on file  Transportation Needs: Not on file  Physical Activity: Not on file  Stress: Not on file  Social Connections: Not on file  Intimate Partner Violence: Not on file      Marcial Pacas, M.D. Ph.D.  [facility] [address] [place], [place] [address] Ph: [phone] Fax: [phone]  CC:  Susa Day, MD [address] [place],  [place] [address]  Jacqueline Orn, MD

## 2021-02-24 LAB — TSH: TSH: 1.15 u[IU]/mL (ref 0.450–4.500)

## 2021-02-26 ENCOUNTER — Other Ambulatory Visit: Payer: Self-pay

## 2021-02-26 ENCOUNTER — Inpatient Hospital Stay: Payer: 59 | Attending: Hematology

## 2021-02-26 DIAGNOSIS — G620 Drug-induced polyneuropathy: Secondary | ICD-10-CM | POA: Insufficient documentation

## 2021-02-26 DIAGNOSIS — Z5111 Encounter for antineoplastic chemotherapy: Secondary | ICD-10-CM | POA: Insufficient documentation

## 2021-02-26 DIAGNOSIS — Z7189 Other specified counseling: Secondary | ICD-10-CM

## 2021-02-26 DIAGNOSIS — C569 Malignant neoplasm of unspecified ovary: Secondary | ICD-10-CM | POA: Insufficient documentation

## 2021-02-26 DIAGNOSIS — N183 Chronic kidney disease, stage 3 unspecified: Secondary | ICD-10-CM | POA: Insufficient documentation

## 2021-02-26 LAB — CBC WITH DIFFERENTIAL (CANCER CENTER ONLY)
Abs Immature Granulocytes: 0.02 10*3/uL (ref 0.00–0.07)
Basophils Absolute: 0 10*3/uL (ref 0.0–0.1)
Basophils Relative: 0 %
Eosinophils Absolute: 0 10*3/uL (ref 0.0–0.5)
Eosinophils Relative: 1 %
HCT: 39.2 % (ref 36.0–46.0)
Hemoglobin: 13.3 g/dL (ref 12.0–15.0)
Immature Granulocytes: 0 %
Lymphocytes Relative: 36 %
Lymphs Abs: 2.4 10*3/uL (ref 0.7–4.0)
MCH: 32.8 pg (ref 26.0–34.0)
MCHC: 33.9 g/dL (ref 30.0–36.0)
MCV: 96.6 fL (ref 80.0–100.0)
Monocytes Absolute: 0.9 10*3/uL (ref 0.1–1.0)
Monocytes Relative: 13 %
Neutro Abs: 3.4 10*3/uL (ref 1.7–7.7)
Neutrophils Relative %: 50 %
Platelet Count: 294 10*3/uL (ref 150–400)
RBC: 4.06 MIL/uL (ref 3.87–5.11)
RDW: 14.5 % (ref 11.5–15.5)
WBC Count: 6.7 10*3/uL (ref 4.0–10.5)
nRBC: 0 % (ref 0.0–0.2)

## 2021-02-26 LAB — CMP (CANCER CENTER ONLY)
ALT: 18 U/L (ref 0–44)
AST: 14 U/L — ABNORMAL LOW (ref 15–41)
Albumin: 4.1 g/dL (ref 3.5–5.0)
Alkaline Phosphatase: 60 U/L (ref 38–126)
Anion gap: 10 (ref 5–15)
BUN: 35 mg/dL — ABNORMAL HIGH (ref 8–23)
CO2: 24 mmol/L (ref 22–32)
Calcium: 9.3 mg/dL (ref 8.9–10.3)
Chloride: 103 mmol/L (ref 98–111)
Creatinine: 1.21 mg/dL — ABNORMAL HIGH (ref 0.44–1.00)
GFR, Estimated: 50 mL/min — ABNORMAL LOW (ref >60)
Glucose, Bld: 95 mg/dL (ref 70–99)
Potassium: 4.2 mmol/L (ref 3.5–5.1)
Sodium: 137 mmol/L (ref 135–145)
Total Bilirubin: 0.4 mg/dL (ref 0.3–1.2)
Total Protein: 7.1 g/dL (ref 6.5–8.1)

## 2021-02-26 NOTE — Progress Notes (Signed)
Chief Complaint:   OBESITY [name] is here to discuss her progress with her obesity treatment plan along with follow-up of her obesity related diagnoses. [name] is on the Category 2 Plan and keeping a food journal and adhering to recommended goals of 1300-1400 calories and 90 grams protein and states she is following her eating plan approximately 50% of the time. [name] states she is walking 30 minutes 1 times per week.  Today's visit was #: 71 Starting weight: 209 lbs Starting date: 04/29/2018 Today's weight: 187 lbs Today's date: 02/22/2021 Total lbs lost to date: 22 Total lbs lost since last in-office visit: +2  Interim History: [name] reports she liked journaling and has learned a lot. She says it is difficult to stay on plan with a lot of celebratory events lately. She found it challenging to hit her calorie and protein goals, especially during those times.  Subjective:   1. Insulin resistance [name] reports some increase in carb cravings after dinner, especially when eating off plan and having too many carbs and too little protein.  2. Depressed mood with emotional eating [name]'s mood is stable and she is doing well.  3. At risk for malnutrition [name] is at increased risk for malnutrition due to being too low on calories and protein intake.  Assessment/Plan:  No orders of the defined types were placed in this encounter.   Medications Discontinued During This Encounter  Medication Reason   doxycycline (VIBRA-TABS) 100 MG tablet Error   buPROPion (WELLBUTRIN SR) 200 MG 12 hr tablet Reorder   metFORMIN (GLUCOPHAGE) 500 MG tablet Reorder     Meds ordered this encounter  Medications   buPROPion (WELLBUTRIN SR) 200 MG 12 hr tablet    Sig: Take 1 tablet by mouth 2 times daily. (need office visit for refills)    Dispense:  60 tablet    Refill:  0    30 d supply; ov for RF   metFORMIN (GLUCOPHAGE) 500 MG tablet    Sig: Take 1 tablet by mouth every morning and 2 tablets every  evening as directed (need office visit for refills)    Dispense:  90 tablet    Refill:  0    30 d supply; ov for RF     1. Insulin resistance [name] will increase Metformin to 500 mg QAM and 1,000 mg QPM. She will continue to work on weight loss, exercise, and decreasing simple carbohydrates to help decrease the risk of diabetes. [name] agreed to follow-up with [name] as directed to closely monitor her progress. Pt declines Mounjaro at this time after a long discussion.  Increase and Refill- metFORMIN (GLUCOPHAGE) 500 MG tablet; Take 1 tablet by mouth every morning and 2 tablets every evening as directed (need office visit for refills)  Dispense: 90 tablet; Refill: 0  2. Depressed mood with emotional eating Behavior modification techniques were discussed today to help [name] deal with her emotional/non-hunger eating behaviors.  Orders and follow up as documented in patient record.   Refill- buPROPion (WELLBUTRIN SR) 200 MG 12 hr tablet; Take 1 tablet by mouth 2 times daily. (need office visit for refills)  Dispense: 60 tablet; Refill: 0  3. At risk for malnutrition January was given approximately 10 minutes of counseling today regarding prevention of malnutrition and ways to meet macronutrient goals..   4. Obesity with current BMI of 30.2  [name] is currently in the action stage of change. As such, her goal is to continue with weight loss efforts. She  has agreed to the Category 2 Plan and keeping a food journal and adhering to recommended goals of 1300-1400 calories and 90 grams protein.   Exercise goals:  Increase as tolerated.  Behavioral modification strategies: celebration eating strategies and planning for success.  [name] has agreed to follow-up with our clinic in 2-3 weeks. She was informed of the importance of frequent follow-up visits to maximize her success with intensive lifestyle modifications for her multiple health conditions.   Objective:   Blood pressure 136/75, pulse 62,  temperature 98 F (36.7 C), height 5\' 6"  (1.676 m), weight 187 lb (84.8 kg), SpO2 96 %. Body mass index is 30.18 kg/m.  General: Cooperative, alert, well developed, in no acute distress. HEENT: Conjunctivae and lids unremarkable. Cardiovascular: Regular rhythm.  Lungs: Normal work of breathing. Neurologic: No focal deficits.   Lab Results  Component Value Date   CREATININE 0.93 02/12/2021   BUN 28 (H) 02/12/2021   NA 138 02/12/2021   K 4.5 02/12/2021   CL 107 02/12/2021   CO2 23 02/12/2021   Lab Results  Component Value Date   ALT 16 02/12/2021   AST 21 02/12/2021   ALKPHOS 58 02/12/2021   BILITOT 0.5 02/12/2021   Lab Results  Component Value Date   HGBA1C 5.2 11/30/2019   HGBA1C 5.3 05/31/2019   HGBA1C 5.2 11/19/2018   HGBA1C 5.5 04/29/2018   Lab Results  Component Value Date   INSULIN 23.8 11/30/2019   INSULIN 16.5 05/31/2019   INSULIN 17.6 11/19/2018   INSULIN 25.8 (H) 04/29/2018   Lab Results  Component Value Date   TSH 1.150 02/23/2021   Lab Results  Component Value Date   CHOL 224 (H) 11/30/2019   HDL 52 11/30/2019   LDLCALC 136 (H) 11/30/2019   TRIG 201 (H) 11/30/2019   Lab Results  Component Value Date   VD25OH 59.0 11/30/2019   VD25OH 54.0 05/31/2019   VD25OH 42.8 11/19/2018   Lab Results  Component Value Date   WBC 5.6 02/12/2021   HGB 12.6 02/12/2021   HCT 37.0 02/12/2021   MCV 95.9 02/12/2021   PLT 280 02/12/2021   Attestation Statements:   Reviewed by clinician on day of visit: allergies, medications, problem list, medical history, surgical history, family history, social history, and previous encounter notes.  Coral Ceo, CMA, am acting as transcriptionist for [employer], DO.  I have reviewed the above documentation for accuracy and completeness, and I agree with the above. Marjory Sneddon, D.O.  The [place] was signed into law in 2016 which includes the topic of electronic health records.  This  provides immediate access to information in MyChart.  This includes consultation notes, operative notes, office notes, lab results and pathology reports.  If you have any questions about what you read please let us know at your next visit so we can discuss your concerns and take corrective action if need be.  We are right here with you.

## 2021-02-27 ENCOUNTER — Inpatient Hospital Stay (HOSPITAL_BASED_OUTPATIENT_CLINIC_OR_DEPARTMENT_OTHER): Payer: 59 | Admitting: Hematology and Oncology

## 2021-02-27 ENCOUNTER — Encounter: Payer: Self-pay | Admitting: Hematology and Oncology

## 2021-02-27 DIAGNOSIS — T451X5A Adverse effect of antineoplastic and immunosuppressive drugs, initial encounter: Secondary | ICD-10-CM | POA: Diagnosis not present

## 2021-02-27 DIAGNOSIS — C569 Malignant neoplasm of unspecified ovary: Secondary | ICD-10-CM

## 2021-02-27 DIAGNOSIS — N183 Chronic kidney disease, stage 3 unspecified: Secondary | ICD-10-CM

## 2021-02-27 DIAGNOSIS — G620 Drug-induced polyneuropathy: Secondary | ICD-10-CM

## 2021-02-27 DIAGNOSIS — Z5111 Encounter for antineoplastic chemotherapy: Secondary | ICD-10-CM | POA: Diagnosis not present

## 2021-02-27 NOTE — Progress Notes (Signed)
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) she has mild peripheral neuropathy, likely related to side effects of treatment. It is only mild, not bothering the patient. I will observe for now If it gets worse in the future, I will consider modifying the dose of the treatment   No orders of the defined types were placed in this encounter.   All questions were answered. The patient knows to call the clinic with any problems, questions or concerns. The total time spent in the appointment was 20 minutes encounter with patients including review of chart and various tests results, discussions about plan of care and coordination of care plan   Heath Lark, MD 02/27/2021 11:41 AM  INTERVAL HISTORY: Please see below for problem oriented charting. she returns for treatment follow-up seen prior to chemotherapy with Taxol for malignant granulosa cell tumor She has noted some very mild peripheral neuropathy affecting her left foot Denies recent nausea or constipation Her appetite is fair Denies abdominal pain  REVIEW OF SYSTEMS:   Constitutional: Denies fevers, chills or abnormal weight loss Eyes: Denies blurriness of vision Ears, nose, mouth, throat, and face: Denies mucositis or sore throat Respiratory: Denies cough, dyspnea or wheezes Cardiovascular: Denies palpitation, chest discomfort or lower extremity swelling Gastrointestinal:  Denies nausea, heartburn or change in bowel habits Skin: Denies abnormal  skin rashes Lymphatics: Denies new lymphadenopathy or easy bruising Behavioral/Psych: Mood is stable, no new changes  All other systems were reviewed with the patient and are negative.  I have reviewed the past medical history, past surgical history, social history and family history with the patient and they are unchanged from previous note.  ALLERGIES:  is allergic to codeine, hydrocodone-acetaminophen, oxycodone, thimerosal, tramadol hcl, ciprofloxacin, daypro [oxaprozin], levofloxacin, stadol [butorphanol tartrate], and sulfa antibiotics.  MEDICATIONS:  Current Outpatient Medications  Medication Sig Dispense Refill   acetaminophen (TYLENOL) 500 MG tablet Take 1,000 mg by mouth every 6 (six) hours as needed for moderate pain.     Biotin 5 MG TABS Take 5 mg by mouth daily.     buPROPion (WELLBUTRIN SR) 200 MG 12 hr tablet Take 1 tablet by mouth 2 times daily. (need office visit for refills) 60 tablet 0   Ca Phosphate-Cholecalciferol (EQL CALCIUM GUMMIES) 250-400 MG-UNIT CHEW Chew 1 tablet by mouth 2 (two) times daily.     escitalopram (LEXAPRO) 20 MG tablet TAKE 1 TABLET BY MOUTH ONCE A DAY (Patient taking differently: Take 20 mg by mouth daily. Taking 10 mg daily, temporarily due to LFT'S.) 90 tablet 3   lidocaine-prilocaine (EMLA) cream Apply to affected area once (Patient taking differently: Apply 1 application topically daily as needed (port access).) 30 g 3   LORazepam (ATIVAN) 0.5 MG tablet Take 1 tablet (0.5 mg total) by mouth 2 (two) times daily as needed for anxiety. 30 tablet 0   metFORMIN (GLUCOPHAGE) 500 MG tablet Take 1 tablet by mouth every morning and 2 tablets every evening as directed (need office visit for refills) 90 tablet 0  mirabegron ER (MYRBETRIQ) 50 MG TB24 tablet Take 1 tablet by mouth once daily 90 tablet 3   mupirocin ointment (BACTROBAN) 2 % Apply to the inside of each nostril twice daily for five (5) days. After application, press sides of nose together and  gently massage. 22 g 1   Omeprazole 20 MG TBEC Take 1 tablet by mouth once a day. 90 tablet 3   ondansetron (ZOFRAN) 8 MG tablet Take 1 tablet (8 mg total) by mouth every 8 (eight) hours as needed for nausea or vomiting. 60 tablet 0   Polyethyl Glycol-Propyl Glycol (SYSTANE OP) Place 1 drop into both eyes daily as needed (dry eyes).     Prenatal MV & Min w/FA-DHA (PRENATAL GUMMIES) 0.18-25 MG CHEW      Probiotic Product (ALIGN PO) Take 1 tablet by mouth daily.      prochlorperazine (COMPAZINE) 10 MG tablet Take 10 mg by mouth every 6 (six) hours as needed for nausea or vomiting.     No current facility-administered medications for this visit.    SUMMARY OF ONCOLOGIC HISTORY: Oncology History Overview Note  2018 - Core biopsy for ER/PR and Foundation One testing performed. Unfortunately, no sufficient tissue for Foundation One testing. ER was 50% and PR 90%. 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     - 09/2000 Chemotherapy   6 cycles of IP cisplatin and etoposide    02/2009 Relapse/Recurrence   CT showed increased size of nodules in pelvis   2010 Surgery   Exlap with section of tumor nodules near cecum and left pelvic sidewall. Tumor: ER negative, PR positive    Treatment Plan Change   Alternated 2 week courses of Megace and Tamoxifen - ended 03/2012    03/2012 PET scan   CT - progressive disease   2013 Treatment Plan Change   Letrozole   01/2016 Imaging   MRI showed progressive disease   2017 Treatment Plan Change   Two weeks of alternating Tamoxifen 75m daily and then Megace 453mTID   08/2016 Imaging   MRI showed progressive disease with peritoneal implants near liver, in pelvis   01/2017 Treatment Plan Change   Lupron 11.25 q 3 months   11/2017 Imaging   Overall mixed response.   Mixed cystic/solid lesions in the left pelvis are mildly improved.   Cystic peritoneal disease, including the dominant lesion along the posterior right hepatic lobe, is mildly progressed.   Subcapsular lesion along the posterior right hepatic lobe is unchanged.   03/2018 Imaging   MRI A/P: Mixed response of individual peritoneal metastases in the pelvis, as described above. Overall, there has been no significant change in bulk of disease.   Stable cystic peritoneal metastatic disease along the capsular surfaces of the liver and spleen.   No new sites of metastatic disease identified within the abdomen or pelvis.   08/2018 Imaging   MRI A/P: Status post hysterectomy and bilateral salpingo-oophorectomy.   Mixed cystic/solid peritoneal implants  in the abdomen/pelvis, as above. Dominant cystic implant along the posterior liver surface is mildly increased. Remaining lesions are overall grossly unchanged.   No new lesions are identified.   01/28/2019 Imaging   Mri A/P: 1. Relatively similar appearance of peritoneal metastasis. A posterior right hepatic capsular based lesion is similar to minimally decreased in size. Left pelvic implants are primarily similar with possible enlargement of an anterior high left pelvic cystic implant. No new disease identified. 2.  Aortic Atherosclerosis (ICD10-I70.0). 3. Hepatic steatosis. 4. Left adrenal adenoma.   06/02/2019 Imaging   MRI 1. Potential slight enlargement of dominant cystic area and solid  component, associated with rind like signal variation on T2 along the inferior right hepatic margin, also potentially slightly increased. Findings may still be within the realm of measurement and technical variation. Close attention on follow-up. 2. Subtle cystic changes along the cephalad margin of the spleen are difficult to see on previous imaging, perhaps new compared with prior imaging studies. 3. Pelvic implants and left lower quadrant lesion with similar size, of the area along the left iliac vasculature may be slightly larger than on the prior study. 4. Signs of extensive retroperitoneal and pelvic lymphadenectomy. 5. Hepatic steatosis. 6. 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     06/08/2019 Cancer Staging   Staging form: [place], AJCC 7th Edition - Clinical: Stage IIIC (rT2, N1, M0) - Signed by Heath Lark, MD on 06/08/2019    06/10/2019 Imaging   1. 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 These findings are slightly worsened in comparison to a remote prior CT examination dated 10/04/2014.   2.  No evidence of metastatic disease in the chest.   3. Status post hysterectomy, pelvic and retroperitoneal lymph node dissection, and ventral hernia mesh repair.   4.  Hepatic steatosis.   5.  Aortic Atherosclerosis (ICD10-I70.0).   06/24/2019 - 09/02/2019 Chemotherapy   The patient had carboplatin for chemotherapy treatment.     09/02/2019 Tumor Marker   Patient's tumor was tested for the following markers: Inhibin B Results of the tumor marker test revealed 94   09/23/2019 Imaging   1. Interval progression of the soft tissue lesions in the anterior left pelvis, likely peritoneal implants, compatible with disease progression. Remaining sites of apparent disease  along the liver capsule and posterior spleen are stable 2. Stable 2 cm left adrenal adenoma. 3. Hepatic steatosis. 4. Right-side predominant colonic diverticulosis without diverticulitis. 5. Aortic Atherosclerosis (ICD10-I70.0).   12/23/2019 Imaging   1. Slight interval increase in size of a mixed solid and cystic nodule in the left hemipelvis measuring 3.0 x 2.9 cm, previously 2.7 x 2.1 cm. 2. Interval decrease in size of a nodule in the left paracolic gutter measuring 1.0 x 0.8 cm, previously 2.1 x 1.6 cm. 3. Stable peritoneal nodules in the hepatorenal recess and at the inferior tip of the spleen. 4. Unchanged nodule or lymph node overlying the left external iliac artery. 5. Enlargement of dominant left pelvic nodule is concerning for disease progression despite interval decrease in size of a nodule in the left paracolic gutter and stability of other nodules. 6. No evidence of metastatic disease in the chest. 7. Stable, benign left adrenal adenoma. 8. Ventral hernia mesh repair with a small component of recurrent hernia inferiorly, containing  a single nonobstructed loop of small bowel. 9. Hepatic steatosis. 10. Aortic Atherosclerosis (ICD10-I70.0).   12/23/2019 Tumor Marker   Patient's tumor was tested for the following markers: Inhibin B Results of the tumor marker test revealed 94.9   01/25/2020 Tumor Marker   Patient's tumor was tested for the following markers: Inhibin B Results of the tumor marker test revealed 116.7   02/20/2020 Genetic Testing   Negative genetic testing: no pathogenic variants detected in [facility] Multi-Cancer Panel.  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  The report date is February 20, 2020.   The Multi-Cancer Panel offered by [facility] includes sequencing and/or deletion duplication testing of the following 85 genes: AIP, ALK, APC, ATM, AXIN2,BAP1,  BARD1, BLM, BMPR1A, BRCA1, BRCA2, BRIP1, CASR, CDC73, CDH1, CDK4, CDKN1B, CDKN1C,  CDKN2A (p14ARF), CDKN2A (p16INK4a), CEBPA, CHEK2, CTNNA1, DICER1, DIS3L2, EGFR (c.2369C>T, p.Thr790Met variant only), EPCAM (Deletion/duplication testing only), FH, FLCN, GATA2, GPC3, GREM1 (Promoter region deletion/duplication testing only), HOXB13 (c.251G>A, p.Gly84Glu), HRAS, KIT, MAX, MEN1, MET, MITF (c.952G>A, p.Glu318Lys variant only), MLH1, MSH2, MSH3, MSH6, MUTYH, NBN, NF1, NF2, NTHL1, PALB2, PDGFRA, PHOX2B, PMS2, POLD1, POLE, POT1, PRKAR1A, PTCH1, PTEN, RAD50, RAD51C, RAD51D, RB1, RECQL4, RET, RNF43, RUNX1, SDHAF2, SDHA (sequence changes only), SDHB, SDHC, SDHD, SMAD4, SMARCA4, SMARCB1, SMARCE1, STK11, SUFU, TERC, TERT, TMEM127, TP53, TSC1, TSC2, VHL, WRN and WT1.    03/02/2020 Tumor Marker   Patient's tumor was tested for the following markers: Inhibin B Results of the tumor marker test revealed 80.8   04/17/2020 Imaging   1. Overall, exam is stable. Multiple peritoneal nodules are again seen. The index nodule in the left lower quadrant is mildly increased in size in the interval. The lesion within the anterior left pelvis has decreased in size in the interval. There has also been decrease in size of cystic lesion within the a hepatorenal recess. The remaining peritoneal lesions are unchanged. No new lesions identified. 2. Stable left adrenal nodule. 3.  Aortic Atherosclerosis (ICD10-I70.0).     08/21/2020 Imaging   Bone density is normal AP spine T score 0.8 Femoral neck on the left, T score -0.2 Femoral neck on the right ,T score -0.4   10/17/2020 Imaging   1. Interval progression of peritoneal nodules along the left pelvic sidewall, concerning for progressive metastatic disease. 2. Small capsular lesion medial right liver and the apparent cystic lesion superior to the right kidney are similar to prior. 3. Tiny soft tissue nodule anterior aspect of the lateral left pelvis described as decreasing on the prior study has decreased further on today's exam. 4. Stable left adrenal nodule.  This cannot be definitively characterized. 5. Tiny nonobstructing stone lower pole right kidney. 6. Aortic Atherosclerosis (ICD10-I70.0).   10/26/2020 Procedure   Successful placement of a right internal jugular approach power injectable Port-A-Cath. The catheter is ready for immediate use.       10/31/2020 -  Chemotherapy   Patient is on Treatment Plan : OVARIAN Paclitaxel T1,5,72,62 q28d     01/25/2021 Imaging   1. Signs of peritoneal disease with scattered peritoneal nodules. The index lesions are either stable or decreased in size in the interval as detailed above. No new sites of disease. 2. Ventral pelvic wall hernia contains a nonobstructed loop of small bowel. 3. Aortic Atherosclerosis (ICD10-I70.0).       PHYSICAL EXAMINATION: ECOG PERFORMANCE STATUS: 1 - Symptomatic but completely ambulatory  Vitals:   02/27/21 1015  BP: (!) 122/58  Pulse: 60  Resp: 18  Temp: 97.6 F (36.4  C)  SpO2: 100%   Filed Weights   02/27/21 1015  Weight: 188 lb (85.3 kg)    GENERAL:alert, no distress and comfortable SKIN: skin color, texture, turgor are normal, no rashes or significant lesions EYES: normal, Conjunctiva are pink and non-injected, sclera clear OROPHARYNX:no exudate, no erythema and lips, buccal mucosa, and tongue normal  NECK: supple, thyroid normal size, non-tender, without nodularity LYMPH:  no palpable lymphadenopathy in the cervical, axillary or inguinal LUNGS: clear to auscultation and percussion with normal breathing effort HEART: regular rate & rhythm and no murmurs and no lower extremity edema ABDOMEN:abdomen soft, non-tender and normal bowel sounds Musculoskeletal:no cyanosis of digits and no clubbing  NEURO: alert & oriented x 3 with fluent speech, no focal motor/sensory deficits  LABORATORY DATA:  I have reviewed the data as listed    Component Value Date/Time   NA 137 02/26/2021 1333   NA 139 11/30/2019 0913   NA 141 09/24/2013 1131   K 4.2 02/26/2021  1333   K 4.5 09/24/2013 1131   CL 103 02/26/2021 1333   CO2 24 02/26/2021 1333   CO2 24 09/24/2013 1131   GLUCOSE 95 02/26/2021 1333   GLUCOSE 90 09/24/2013 1131   BUN 35 (H) 02/26/2021 1333   BUN 32 (H) 11/30/2019 0913   BUN 21.2 09/24/2013 1131   CREATININE 1.21 (H) 02/26/2021 1333   CREATININE 1.1 09/24/2013 1131   CALCIUM 9.3 02/26/2021 1333   CALCIUM 9.5 09/24/2013 1131   PROT 7.1 02/26/2021 1333   PROT 7.0 11/30/2019 0913   PROT 6.8 09/24/2013 1131   ALBUMIN 4.1 02/26/2021 1333   ALBUMIN 4.3 11/30/2019 0913   ALBUMIN 3.9 09/24/2013 1131   AST 14 (L) 02/26/2021 1333   AST 23 09/24/2013 1131   ALT 18 02/26/2021 1333   ALT 27 09/24/2013 1131   ALKPHOS 60 02/26/2021 1333   ALKPHOS 81 09/24/2013 1131   BILITOT 0.4 02/26/2021 1333   BILITOT 0.53 09/24/2013 1131   GFRNONAA 50 (L) 02/26/2021 1333   GFRAA 55 (L) 01/24/2020 1530   GFRAA 57 (L) 12/21/2019 1259    No results found for: SPEP, UPEP  Lab Results  Component Value Date   WBC 6.7 02/26/2021   NEUTROABS 3.4 02/26/2021   HGB 13.3 02/26/2021   HCT 39.2 02/26/2021   MCV 96.6 02/26/2021   PLT 294 02/26/2021      Chemistry      Component Value Date/Time   NA 137 02/26/2021 1333   NA 139 11/30/2019 0913   NA 141 09/24/2013 1131   K 4.2 02/26/2021 1333   K 4.5 09/24/2013 1131   CL 103 02/26/2021 1333   CO2 24 02/26/2021 1333   CO2 24 09/24/2013 1131   BUN 35 (H) 02/26/2021 1333   BUN 32 (H) 11/30/2019 0913   BUN 21.2 09/24/2013 1131   CREATININE 1.21 (H) 02/26/2021 1333   CREATININE 1.1 09/24/2013 1131      Component Value Date/Time   CALCIUM 9.3 02/26/2021 1333   CALCIUM 9.5 09/24/2013 1131   ALKPHOS 60 02/26/2021 1333   ALKPHOS 81 09/24/2013 1131   AST 14 (L) 02/26/2021 1333   AST 23 09/24/2013 1131   ALT 18 02/26/2021 1333   ALT 27 09/24/2013 1131   BILITOT 0.4 02/26/2021 1333   BILITOT 0.53 09/24/2013 1131

## 2021-02-27 NOTE — Assessment & Plan Note (Signed)
She tolerated recent reduced dose Taxol well She will continue day 1 and day 8 treatment, rest day 15 I plan to repeat imaging study in January

## 2021-02-27 NOTE — Assessment & Plan Note (Signed)
she has mild peripheral neuropathy, likely related to side effects of treatment. It is only mild, not bothering the patient. I will observe for now If it gets worse in the future, I will consider modifying the dose of the treatment  

## 2021-02-27 NOTE — Assessment & Plan Note (Signed)
Renal function is stable but she is prone to get dehydrated We discussed importance of oral fluid intake

## 2021-02-28 ENCOUNTER — Inpatient Hospital Stay: Payer: 59

## 2021-02-28 ENCOUNTER — Other Ambulatory Visit: Payer: Self-pay

## 2021-02-28 VITALS — BP 117/64 | HR 58 | Temp 98.0°F | Resp 18

## 2021-02-28 DIAGNOSIS — C569 Malignant neoplasm of unspecified ovary: Secondary | ICD-10-CM

## 2021-02-28 DIAGNOSIS — N183 Chronic kidney disease, stage 3 unspecified: Secondary | ICD-10-CM | POA: Diagnosis not present

## 2021-02-28 DIAGNOSIS — Z7189 Other specified counseling: Secondary | ICD-10-CM

## 2021-02-28 DIAGNOSIS — Z5111 Encounter for antineoplastic chemotherapy: Secondary | ICD-10-CM | POA: Diagnosis not present

## 2021-02-28 DIAGNOSIS — G620 Drug-induced polyneuropathy: Secondary | ICD-10-CM | POA: Diagnosis not present

## 2021-02-28 MED ORDER — HEPARIN SOD (PORK) LOCK FLUSH 100 UNIT/ML IV SOLN
500.0000 [IU] | Freq: Once | INTRAVENOUS | Status: AC | PRN
  Administered 2021-02-28: 500 [IU]

## 2021-02-28 MED ORDER — FAMOTIDINE 20 MG IN NS 100 ML IVPB
20.0000 mg | Freq: Once | INTRAVENOUS | Status: AC
  Administered 2021-02-28: 20 mg via INTRAVENOUS
  Filled 2021-02-28: qty 100

## 2021-02-28 MED ORDER — SODIUM CHLORIDE 0.9 % IV SOLN
60.0000 mg/m2 | Freq: Once | INTRAVENOUS | Status: AC
  Administered 2021-02-28: 120 mg via INTRAVENOUS
  Filled 2021-02-28: qty 20

## 2021-02-28 MED ORDER — SODIUM CHLORIDE 0.9% FLUSH
10.0000 mL | INTRAVENOUS | Status: DC | PRN
  Administered 2021-02-28: 10 mL

## 2021-02-28 MED ORDER — DEXAMETHASONE SODIUM PHOSPHATE 10 MG/ML IJ SOLN
5.0000 mg | Freq: Once | INTRAMUSCULAR | Status: AC
  Administered 2021-02-28: 5 mg via INTRAVENOUS
  Filled 2021-02-28: qty 1

## 2021-02-28 MED ORDER — DIPHENHYDRAMINE HCL 50 MG/ML IJ SOLN
25.0000 mg | Freq: Once | INTRAMUSCULAR | Status: AC
  Administered 2021-02-28: 25 mg via INTRAVENOUS
  Filled 2021-02-28: qty 1

## 2021-02-28 MED ORDER — SODIUM CHLORIDE 0.9 % IV SOLN: Freq: Once | INTRAVENOUS | Status: AC

## 2021-02-28 NOTE — Patient Instructions (Signed)
[facility]   Discharge Instructions: Thank you for choosing [facility] to provide your oncology and hematology care.   If you have a lab appointment with the Cancer Center, please go directly to the Cancer Center and check in at the registration area.   Wear comfortable clothing and clothing appropriate for easy access to any Portacath or PICC line.   We strive to give you quality time with your provider. You may need to reschedule your appointment if you arrive late (15 or more minutes).  Arriving late affects you and other patients whose appointments are after yours.  Also, if you miss three or more appointments without notifying the office, you may be dismissed from the clinic at the provider's discretion.      For prescription refill requests, have your pharmacy contact our office and allow 72 hours for refills to be completed.    Today you received the following chemotherapy and/or immunotherapy agents: paclitaxel.      To help prevent nausea and vomiting after your treatment, we encourage you to take your nausea medication as directed.  BELOW ARE SYMPTOMS THAT SHOULD BE REPORTED IMMEDIATELY: *FEVER GREATER THAN 100.4 F (38 C) OR HIGHER *CHILLS OR SWEATING *NAUSEA AND VOMITING THAT IS NOT CONTROLLED WITH YOUR NAUSEA MEDICATION *UNUSUAL SHORTNESS OF BREATH *UNUSUAL BRUISING OR BLEEDING *URINARY PROBLEMS (pain or burning when urinating, or frequent urination) *BOWEL PROBLEMS (unusual diarrhea, constipation, pain near the anus) TENDERNESS IN MOUTH AND THROAT WITH OR WITHOUT PRESENCE OF ULCERS (sore throat, sores in mouth, or a toothache) UNUSUAL RASH, SWELLING OR PAIN  UNUSUAL VAGINAL DISCHARGE OR ITCHING   Items with * indicate a potential emergency and should be followed up as soon as possible or go to the Emergency Department if any problems should occur.  Please show the CHEMOTHERAPY ALERT CARD or IMMUNOTHERAPY ALERT CARD at check-in  to the Emergency Department and triage nurse.  Should you have questions after your visit or need to cancel or reschedule your appointment, please contact [facility]  Dept: [phone]  and follow the prompts.  Office hours are 8:00 a.m. to 4:30 p.m. Monday - Friday. Please note that voicemails left after 4:00 p.m. may not be returned until the following business day.  We are closed weekends and major holidays. You have access to a nurse at all times for urgent questions. Please call the main number to the clinic Dept: [phone] and follow the prompts.   For any non-urgent questions, you may also contact your provider using MyChart. We now offer e-Visits for anyone 18 and older to request care online for non-urgent symptoms. For details visit [email or web address].   Also download the MyChart app! Go to the app store, search "MyChart", open the app, select , and log in with your MyChart username and password.  Due to Covid, a mask is required upon entering the hospital/clinic. If you do not have a mask, one will be given to you upon arrival. For doctor visits, patients may have 1 support person aged 18 or older with them. For treatment visits, patients cannot have anyone with them due to current Covid guidelines and our immunocompromised population.   

## 2021-03-02 ENCOUNTER — Ambulatory Visit: Payer: 59

## 2021-03-02 ENCOUNTER — Other Ambulatory Visit: Payer: 59

## 2021-03-05 ENCOUNTER — Encounter: Payer: Self-pay | Admitting: Hematology and Oncology

## 2021-03-05 NOTE — Progress Notes (Signed)
   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  Mix of 0.25% Marcaine 1.5 cc with 1.5 cc of betamethasone    The injection was performed at the right nuchal area along the nuchal line, more than 3, multiple injection sites, patient tolerated injection well, mild improvement  after injection

## 2021-03-06 ENCOUNTER — Other Ambulatory Visit: Payer: Self-pay | Admitting: Hematology and Oncology

## 2021-03-06 ENCOUNTER — Encounter: Payer: Self-pay | Admitting: Hematology and Oncology

## 2021-03-06 ENCOUNTER — Other Ambulatory Visit: Payer: Self-pay

## 2021-03-06 ENCOUNTER — Other Ambulatory Visit (HOSPITAL_COMMUNITY): Payer: Self-pay

## 2021-03-06 ENCOUNTER — Inpatient Hospital Stay: Payer: 59

## 2021-03-06 DIAGNOSIS — C569 Malignant neoplasm of unspecified ovary: Secondary | ICD-10-CM

## 2021-03-06 DIAGNOSIS — Z7189 Other specified counseling: Secondary | ICD-10-CM

## 2021-03-06 DIAGNOSIS — G620 Drug-induced polyneuropathy: Secondary | ICD-10-CM | POA: Diagnosis not present

## 2021-03-06 DIAGNOSIS — N183 Chronic kidney disease, stage 3 unspecified: Secondary | ICD-10-CM | POA: Diagnosis not present

## 2021-03-06 DIAGNOSIS — Z5111 Encounter for antineoplastic chemotherapy: Secondary | ICD-10-CM | POA: Diagnosis not present

## 2021-03-06 LAB — CBC WITH DIFFERENTIAL (CANCER CENTER ONLY)
Abs Immature Granulocytes: 0.04 10*3/uL (ref 0.00–0.07)
Basophils Absolute: 0 10*3/uL (ref 0.0–0.1)
Basophils Relative: 1 %
Eosinophils Absolute: 0.1 10*3/uL (ref 0.0–0.5)
Eosinophils Relative: 2 %
HCT: 37.7 % (ref 36.0–46.0)
Hemoglobin: 12.3 g/dL (ref 12.0–15.0)
Immature Granulocytes: 1 %
Lymphocytes Relative: 34 %
Lymphs Abs: 2.2 10*3/uL (ref 0.7–4.0)
MCH: 32.2 pg (ref 26.0–34.0)
MCHC: 32.6 g/dL (ref 30.0–36.0)
MCV: 98.7 fL (ref 80.0–100.0)
Monocytes Absolute: 0.4 10*3/uL (ref 0.1–1.0)
Monocytes Relative: 6 %
Neutro Abs: 3.8 10*3/uL (ref 1.7–7.7)
Neutrophils Relative %: 56 %
Platelet Count: 235 10*3/uL (ref 150–400)
RBC: 3.82 MIL/uL — ABNORMAL LOW (ref 3.87–5.11)
RDW: 13.9 % (ref 11.5–15.5)
WBC Count: 6.5 10*3/uL (ref 4.0–10.5)
nRBC: 0 % (ref 0.0–0.2)

## 2021-03-06 LAB — CMP (CANCER CENTER ONLY)
ALT: 15 U/L (ref 0–44)
AST: 14 U/L — ABNORMAL LOW (ref 15–41)
Albumin: 3.6 g/dL (ref 3.5–5.0)
Alkaline Phosphatase: 58 U/L (ref 38–126)
Anion gap: 9 (ref 5–15)
BUN: 34 mg/dL — ABNORMAL HIGH (ref 8–23)
CO2: 24 mmol/L (ref 22–32)
Calcium: 9 mg/dL (ref 8.9–10.3)
Chloride: 105 mmol/L (ref 98–111)
Creatinine: 1.27 mg/dL — ABNORMAL HIGH (ref 0.44–1.00)
GFR, Estimated: 47 mL/min — ABNORMAL LOW (ref >60)
Glucose, Bld: 97 mg/dL (ref 70–99)
Potassium: 4.4 mmol/L (ref 3.5–5.1)
Sodium: 138 mmol/L (ref 135–145)
Total Bilirubin: 0.3 mg/dL (ref 0.3–1.2)
Total Protein: 6.4 g/dL — ABNORMAL LOW (ref 6.5–8.1)

## 2021-03-06 MED ORDER — GABAPENTIN 300 MG PO CAPS
300.0000 mg | ORAL_CAPSULE | Freq: Two times a day (BID) | ORAL | 3 refills | Status: DC
  Filled 2021-03-06: qty 60, 30d supply, fill #0

## 2021-03-06 NOTE — Telephone Encounter (Signed)
I reviewed plan of care with the patient directly face-to-face and ultimately, she is in agreement to try gabapentin Side effects were reviewed and prescription is sent to the pharmacy

## 2021-03-07 ENCOUNTER — Inpatient Hospital Stay: Payer: 59

## 2021-03-07 VITALS — BP 128/62 | HR 53 | Temp 98.2°F | Resp 18

## 2021-03-07 DIAGNOSIS — C569 Malignant neoplasm of unspecified ovary: Secondary | ICD-10-CM | POA: Diagnosis not present

## 2021-03-07 DIAGNOSIS — Z7189 Other specified counseling: Secondary | ICD-10-CM

## 2021-03-07 DIAGNOSIS — G620 Drug-induced polyneuropathy: Secondary | ICD-10-CM | POA: Diagnosis not present

## 2021-03-07 DIAGNOSIS — Z5111 Encounter for antineoplastic chemotherapy: Secondary | ICD-10-CM | POA: Diagnosis not present

## 2021-03-07 DIAGNOSIS — N183 Chronic kidney disease, stage 3 unspecified: Secondary | ICD-10-CM | POA: Diagnosis not present

## 2021-03-07 MED ORDER — DIPHENHYDRAMINE HCL 50 MG/ML IJ SOLN
25.0000 mg | Freq: Once | INTRAMUSCULAR | Status: AC
  Administered 2021-03-07: 25 mg via INTRAVENOUS
  Filled 2021-03-07: qty 1

## 2021-03-07 MED ORDER — DEXAMETHASONE SODIUM PHOSPHATE 10 MG/ML IJ SOLN
5.0000 mg | Freq: Once | INTRAMUSCULAR | Status: AC
  Administered 2021-03-07: 5 mg via INTRAVENOUS
  Filled 2021-03-07: qty 1

## 2021-03-07 MED ORDER — SODIUM CHLORIDE 0.9% FLUSH
10.0000 mL | INTRAVENOUS | Status: DC | PRN
  Administered 2021-03-07: 10 mL

## 2021-03-07 MED ORDER — SODIUM CHLORIDE 0.9 % IV SOLN: Freq: Once | INTRAVENOUS | Status: AC

## 2021-03-07 MED ORDER — HEPARIN SOD (PORK) LOCK FLUSH 100 UNIT/ML IV SOLN
500.0000 [IU] | Freq: Once | INTRAVENOUS | Status: AC | PRN
  Administered 2021-03-07: 500 [IU]

## 2021-03-07 MED ORDER — SODIUM CHLORIDE 0.9 % IV SOLN
60.0000 mg/m2 | Freq: Once | INTRAVENOUS | Status: AC
  Administered 2021-03-07: 120 mg via INTRAVENOUS
  Filled 2021-03-07: qty 20

## 2021-03-07 MED ORDER — FAMOTIDINE 20 MG IN NS 100 ML IVPB
20.0000 mg | Freq: Once | INTRAVENOUS | Status: AC
  Administered 2021-03-07: 20 mg via INTRAVENOUS
  Filled 2021-03-07: qty 100

## 2021-03-07 NOTE — Patient Instructions (Signed)
[facility]   Discharge Instructions: Thank you for choosing [facility] to provide your oncology and hematology care.   If you have a lab appointment with the Cancer Center, please go directly to the Cancer Center and check in at the registration area.   Wear comfortable clothing and clothing appropriate for easy access to any Portacath or PICC line.   We strive to give you quality time with your provider. You may need to reschedule your appointment if you arrive late (15 or more minutes).  Arriving late affects you and other patients whose appointments are after yours.  Also, if you miss three or more appointments without notifying the office, you may be dismissed from the clinic at the provider's discretion.      For prescription refill requests, have your pharmacy contact our office and allow 72 hours for refills to be completed.    Today you received the following chemotherapy and/or immunotherapy agents: paclitaxel.      To help prevent nausea and vomiting after your treatment, we encourage you to take your nausea medication as directed.  BELOW ARE SYMPTOMS THAT SHOULD BE REPORTED IMMEDIATELY: *FEVER GREATER THAN 100.4 F (38 C) OR HIGHER *CHILLS OR SWEATING *NAUSEA AND VOMITING THAT IS NOT CONTROLLED WITH YOUR NAUSEA MEDICATION *UNUSUAL SHORTNESS OF BREATH *UNUSUAL BRUISING OR BLEEDING *URINARY PROBLEMS (pain or burning when urinating, or frequent urination) *BOWEL PROBLEMS (unusual diarrhea, constipation, pain near the anus) TENDERNESS IN MOUTH AND THROAT WITH OR WITHOUT PRESENCE OF ULCERS (sore throat, sores in mouth, or a toothache) UNUSUAL RASH, SWELLING OR PAIN  UNUSUAL VAGINAL DISCHARGE OR ITCHING   Items with * indicate a potential emergency and should be followed up as soon as possible or go to the Emergency Department if any problems should occur.  Please show the CHEMOTHERAPY ALERT CARD or IMMUNOTHERAPY ALERT CARD at check-in  to the Emergency Department and triage nurse.  Should you have questions after your visit or need to cancel or reschedule your appointment, please contact [facility]  Dept: [phone]  and follow the prompts.  Office hours are 8:00 a.m. to 4:30 p.m. Monday - Friday. Please note that voicemails left after 4:00 p.m. may not be returned until the following business day.  We are closed weekends and major holidays. You have access to a nurse at all times for urgent questions. Please call the main number to the clinic Dept: [phone] and follow the prompts.   For any non-urgent questions, you may also contact your provider using MyChart. We now offer e-Visits for anyone 18 and older to request care online for non-urgent symptoms. For details visit [email or web address].   Also download the MyChart app! Go to the app store, search "MyChart", open the app, select [facility], and log in with your MyChart username and password.  Due to Covid, a mask is required upon entering the hospital/clinic. If you do not have a mask, one will be given to you upon arrival. For doctor visits, patients may have 1 support person aged 18 or older with them. For treatment visits, patients cannot have anyone with them due to current Covid guidelines and our immunocompromised population.   

## 2021-03-12 ENCOUNTER — Other Ambulatory Visit (HOSPITAL_COMMUNITY): Payer: Self-pay

## 2021-03-13 ENCOUNTER — Other Ambulatory Visit (HOSPITAL_COMMUNITY): Payer: Self-pay

## 2021-03-13 ENCOUNTER — Other Ambulatory Visit: Payer: Self-pay

## 2021-03-13 DIAGNOSIS — G4733 Obstructive sleep apnea (adult) (pediatric): Secondary | ICD-10-CM | POA: Diagnosis not present

## 2021-03-13 MED ORDER — PROCHLORPERAZINE MALEATE 10 MG PO TABS
10.0000 mg | ORAL_TABLET | Freq: Four times a day (QID) | ORAL | 1 refills | Status: DC | PRN
  Filled 2021-03-13: qty 30, 8d supply, fill #0

## 2021-03-16 ENCOUNTER — Encounter: Payer: Self-pay | Admitting: Hematology and Oncology

## 2021-03-19 ENCOUNTER — Encounter: Payer: Self-pay | Admitting: Hematology and Oncology

## 2021-03-19 ENCOUNTER — Inpatient Hospital Stay (HOSPITAL_BASED_OUTPATIENT_CLINIC_OR_DEPARTMENT_OTHER): Payer: 59 | Admitting: Hematology and Oncology

## 2021-03-19 ENCOUNTER — Other Ambulatory Visit: Payer: Self-pay

## 2021-03-19 ENCOUNTER — Encounter (INDEPENDENT_AMBULATORY_CARE_PROVIDER_SITE_OTHER): Payer: Self-pay | Admitting: Family Medicine

## 2021-03-19 DIAGNOSIS — G620 Drug-induced polyneuropathy: Secondary | ICD-10-CM

## 2021-03-19 DIAGNOSIS — C569 Malignant neoplasm of unspecified ovary: Secondary | ICD-10-CM | POA: Diagnosis not present

## 2021-03-19 DIAGNOSIS — T451X5A Adverse effect of antineoplastic and immunosuppressive drugs, initial encounter: Secondary | ICD-10-CM | POA: Diagnosis not present

## 2021-03-19 NOTE — Progress Notes (Signed)
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[facility]) She has experienced worsening peripheral neuropathy The patient thought that she can go back on tamoxifen I reviewed her oncologic history and prior treatment with the patient She had documented failure of tamoxifen in 2013 and in 2017 We discussed the risk and benefits of discontinuation of paclitaxel and switching her to other chemotherapy Ultimately, the patient will keep her appointment as scheduled  Peripheral neuropathy due to chemotherapy [facility]) We had extensive discussions about the risk and benefits of gabapentin She has not started gabapentin We discussed the risk and benefits of discontinuation of paclitaxel Ultimately, she has made informed decision to pursue a trial of gabapentin to see if she can benefit from it  No orders of the defined types were placed in this encounter.   All questions were answered. The patient knows to call the clinic with any problems, questions or concerns. The total time spent in the appointment was 20 minutes encounter with patients including review of chart and various tests results, discussions about plan of care and coordination of care plan   Heath Lark, MD 03/19/2021 12:36 PM  INTERVAL HISTORY: Please see below for problem oriented charting. she returns for further discussion about management of neuropathy in the setting of chemotherapy for recurrent granulosa cell tumor of the ovary She is concerned about worsening neuropathy affecting the 4 lateral toes on her left foot She describes the sensation as numb and tingly but not burning or painful From a previous discussion, I have recommended a trial of gabapentin but she has not started taking it yet In the meantime, she denies abdominal pain or changes in her bowel habits or symptoms  REVIEW OF  SYSTEMS:   Constitutional: Denies fevers, chills or abnormal weight loss Eyes: Denies blurriness of vision Ears, nose, mouth, throat, and face: Denies mucositis or sore throat Respiratory: Denies cough, dyspnea or wheezes Cardiovascular: Denies palpitation, chest discomfort or lower extremity swelling Gastrointestinal:  Denies nausea, heartburn or change in bowel habits Skin: Denies abnormal skin rashes Lymphatics: Denies new lymphadenopathy or easy bruising Behavioral/Psych: Mood is stable, no new changes  All other systems were reviewed with the patient and are negative.  I have reviewed the past medical history, past surgical history, social history and family history with the patient and they are unchanged from previous note.  ALLERGIES:  is allergic to codeine, hydrocodone-acetaminophen, oxycodone, thimerosal, tramadol hcl, ciprofloxacin, daypro [oxaprozin], levofloxacin, stadol [butorphanol tartrate], and sulfa antibiotics.  MEDICATIONS:  Current Outpatient Medications  Medication Sig Dispense Refill   acetaminophen (TYLENOL) 500 MG tablet Take 1,000 mg by mouth every 6 (six) hours as needed for moderate pain.     Biotin 5 MG TABS Take 5 mg by mouth daily.     buPROPion (WELLBUTRIN SR) 200 MG 12 hr tablet Take 1 tablet by mouth 2 times daily. (need office visit for refills) 60 tablet 0   Ca Phosphate-Cholecalciferol (EQL CALCIUM GUMMIES) 250-400 MG-UNIT CHEW Chew 1 tablet by mouth 2 (two) times daily.     escitalopram (LEXAPRO) 20 MG tablet TAKE 1 TABLET BY MOUTH ONCE A DAY (Patient taking differently: Take 20 mg by mouth daily. Taking 10 mg daily, temporarily due to LFT'S.) 90 tablet 3   gabapentin (NEURONTIN) 300 MG capsule Take 1 capsule (300 mg total) by mouth 2 (two) times daily. 60 capsule 3  lidocaine-prilocaine (EMLA) cream Apply to affected area once (Patient taking differently: Apply 1 application topically daily as needed (port access).) 30 g 3   LORazepam (ATIVAN) 0.5 MG  tablet Take 1 tablet (0.5 mg total) by mouth 2 (two) times daily as needed for anxiety. 30 tablet 0   metFORMIN (GLUCOPHAGE) 500 MG tablet Take 1 tablet by mouth every morning and 2 tablets every evening as directed (need office visit for refills) 90 tablet 0   mirabegron ER (MYRBETRIQ) 50 MG TB24 tablet Take 1 tablet by mouth once daily 90 tablet 3   mupirocin ointment (BACTROBAN) 2 % Apply to the inside of each nostril twice daily for five (5) days. After application, press sides of nose together and gently massage. 22 g 1   Omeprazole 20 MG TBEC Take 1 tablet by mouth once a day. 90 tablet 3   ondansetron (ZOFRAN) 8 MG tablet Take 1 tablet (8 mg total) by mouth every 8 (eight) hours as needed for nausea or vomiting. 60 tablet 0   Polyethyl Glycol-Propyl Glycol (SYSTANE OP) Place 1 drop into both eyes daily as needed (dry eyes).     Prenatal MV & Min w/FA-DHA (PRENATAL GUMMIES) 0.18-25 MG CHEW      Probiotic Product (ALIGN PO) Take 1 tablet by mouth daily.      prochlorperazine (COMPAZINE) 10 MG tablet Take 1 tablet by mouth every 6 hours as needed for nausea or vomiting. 30 tablet 1   No current facility-administered medications for this visit.    SUMMARY OF ONCOLOGIC HISTORY: Oncology History Overview Note  2018 - Core biopsy for ER/PR and Foundation One testing performed. Unfortunately, no sufficient tissue for Foundation One testing. ER was 50% and PR 90%. Progressed on carboplatin, exemestane, Avastin,Tamoxifen/Megace and letrozole, mixed response on Lupron  AMH: 06/23/19: 108 05/26/19: 9.04 01/05/19: 6.84 09/02/18: 3.72 06/01/18: 3.84 02/24/18: 2.6 11/21/17: 3.26 08/26/17: 1.77 05/27/17: 2.12 01/28/17: 2.47 06/10/16: 2.68 02/06/16: 1.84 05/12/15: 3.27 10/03/14: 2.12  Inhibin B 09/22/2019: 95.9 05/26/19: 90.2 01/05/19: 92 09/02/18: 70.9 06/04/18: 70 02/24/18: 79.5 11/21/17: 85.8 08/26/17: 65.6 05/27/17: 58.9 01/28/17: 198 06/10/16: 118.1 02/06/16: 62.4 05/12/15: 64.6 10/03/14: 58   Malignant  granulosa cell tumor of ovary ([place])  1994 Initial Diagnosis   1994   2002 Relapse/Recurrence   Upper abdominal recurrence, resected.     - 09/2000 Chemotherapy   6 cycles of IP cisplatin and etoposide    02/2009 Relapse/Recurrence   CT showed increased size of nodules in pelvis   2010 Surgery   Exlap with section of tumor nodules near cecum and left pelvic sidewall. Tumor: ER negative, PR positive    Treatment Plan Change   Alternated 2 week courses of Megace and Tamoxifen - ended 03/2012   03/2012 PET scan   CT - progressive disease   2013 Treatment Plan Change   Letrozole   01/2016 Imaging   MRI showed progressive disease   2017 Treatment Plan Change   Two weeks of alternating Tamoxifen 48m daily and then Megace 418mTID   08/2016 Imaging   MRI showed progressive disease with peritoneal implants near liver, in pelvis   01/2017 Treatment Plan Change   Lupron 11.25 q 3 months   11/2017 Imaging   Overall mixed response.   Mixed cystic/solid lesions in the left pelvis are mildly improved.   Cystic peritoneal disease, including the dominant lesion along the posterior right hepatic lobe, is mildly progressed.   Subcapsular lesion along the posterior right hepatic lobe is unchanged.  03/2018 Imaging   MRI A/P: Mixed response of individual peritoneal metastases in the pelvis, as described above. Overall, there has been no significant change in bulk of disease.   Stable cystic peritoneal metastatic disease along the capsular surfaces of the liver and spleen.   No new sites of metastatic disease identified within the abdomen or pelvis.   08/2018 Imaging   MRI A/P: Status post hysterectomy and bilateral salpingo-oophorectomy.   Mixed cystic/solid peritoneal implants in the abdomen/pelvis, as above. Dominant cystic implant along the posterior liver surface is mildly increased. Remaining lesions are overall grossly unchanged.   No new lesions are identified.    01/28/2019 Imaging   Mri A/P: 1. Relatively similar appearance of peritoneal metastasis. A posterior right hepatic capsular based lesion is similar to minimally decreased in size. Left pelvic implants are primarily similar with possible enlargement of an anterior high left pelvic cystic implant. No new disease identified. 2.  Aortic Atherosclerosis (ICD10-I70.0). 3. Hepatic steatosis. 4. Left adrenal adenoma.   06/02/2019 Imaging   MRI 1. Potential slight enlargement of dominant cystic area and solid component, associated with rind like signal variation on T2 along the inferior right hepatic margin, also potentially slightly increased. Findings may still be within the realm of measurement and technical variation. Close attention on follow-up. 2. Subtle cystic changes along the cephalad margin of the spleen are difficult to see on previous imaging, perhaps new compared with prior imaging studies. 3. Pelvic implants and left lower quadrant lesion with similar size, of the area along the left iliac vasculature may be slightly larger than on the prior study. 4. Signs of extensive retroperitoneal and pelvic lymphadenectomy. 5. Hepatic steatosis. 6. 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     06/08/2019 Cancer Staging   Staging form: [place], AJCC 7th Edition - Clinical: Stage IIIC (rT2, N1, M0) - Signed by Heath Lark, MD on 06/08/2019    06/10/2019 Imaging   1. 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 These findings are slightly worsened in comparison to a remote prior CT examination dated 10/04/2014.   2.  No evidence of metastatic disease in the chest.   3. Status post hysterectomy, pelvic and retroperitoneal lymph node dissection, and ventral hernia mesh repair.    4.  Hepatic steatosis.   5.  Aortic Atherosclerosis (ICD10-I70.0).   06/24/2019 - 09/02/2019 Chemotherapy   The patient had carboplatin for chemotherapy treatment.     09/02/2019 Tumor Marker   Patient's tumor was tested for the following markers: Inhibin B Results of the tumor marker test revealed 94   09/23/2019 Imaging   1. Interval progression of the soft tissue lesions in the anterior left pelvis, likely peritoneal implants, compatible with disease progression. Remaining sites of apparent disease along the liver capsule and posterior spleen are stable 2. Stable 2 cm left adrenal adenoma. 3. Hepatic steatosis. 4. Right-side predominant colonic diverticulosis without diverticulitis. 5. Aortic Atherosclerosis (ICD10-I70.0).   12/23/2019 Imaging   1. Slight interval increase in size of a mixed solid and cystic nodule in the left hemipelvis measuring 3.0 x 2.9 cm, previously 2.7 x 2.1 cm. 2. Interval decrease in size of a nodule in the left paracolic gutter measuring 1.0 x 0.8 cm, previously 2.1 x 1.6 cm. 3. Stable peritoneal nodules in the hepatorenal recess  and at the inferior tip of the spleen. 4. Unchanged nodule or lymph node overlying the left external iliac artery. 5. Enlargement of dominant left pelvic nodule is concerning for disease progression despite interval decrease in size of a nodule in the left paracolic gutter and stability of other nodules. 6. No evidence of metastatic disease in the chest. 7. Stable, benign left adrenal adenoma. 8. Ventral hernia mesh repair with a small component of recurrent hernia inferiorly, containing a single nonobstructed loop of small bowel. 9. Hepatic steatosis. 10. Aortic Atherosclerosis (ICD10-I70.0).   12/23/2019 Tumor Marker   Patient's tumor was tested for the following markers: Inhibin B Results of the tumor marker test revealed 94.9   01/25/2020 Tumor Marker   Patient's tumor was tested for the following markers: Inhibin B Results of  the tumor marker test revealed 116.7   02/20/2020 Genetic Testing   Negative genetic testing: no pathogenic variants detected in [facility] Multi-Cancer Panel.  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  The report date is February 20, 2020.   The Multi-Cancer Panel offered by [facility] includes sequencing and/or deletion duplication testing of the following 85 genes: AIP, ALK, APC, ATM, AXIN2,BAP1,  BARD1, BLM, BMPR1A, BRCA1, BRCA2, BRIP1, CASR, CDC73, CDH1, CDK4, CDKN1B, CDKN1C, CDKN2A (p14ARF), CDKN2A (p16INK4a), CEBPA, CHEK2, CTNNA1, DICER1, DIS3L2, EGFR (c.2369C>T, p.Thr790Met variant only), EPCAM (Deletion/duplication testing only), FH, FLCN, GATA2, GPC3, GREM1 (Promoter region deletion/duplication testing only), HOXB13 (c.251G>A, p.Gly84Glu), HRAS, KIT, MAX, MEN1, MET, MITF (c.952G>A, p.Glu318Lys variant only), MLH1, MSH2, MSH3, MSH6, MUTYH, NBN, NF1, NF2, NTHL1, PALB2, PDGFRA, PHOX2B, PMS2, POLD1, POLE, POT1, PRKAR1A, PTCH1, PTEN, RAD50, RAD51C, RAD51D, RB1, RECQL4, RET, RNF43, RUNX1, SDHAF2, SDHA (sequence changes only), SDHB, SDHC, SDHD, SMAD4, SMARCA4, SMARCB1, SMARCE1, STK11, SUFU, TERC, TERT, TMEM127, TP53, TSC1, TSC2, VHL, WRN and WT1.    03/02/2020 Tumor Marker   Patient's tumor was tested for the following markers: Inhibin B Results of the tumor marker test revealed 80.8   04/17/2020 Imaging   1. Overall, exam is stable. Multiple peritoneal nodules are again seen. The index nodule in the left lower quadrant is mildly increased in size in the interval. The lesion within the anterior left pelvis has decreased in size in the interval. There has also been decrease in size of cystic lesion within the a hepatorenal recess. The remaining peritoneal lesions are unchanged. No new lesions identified. 2. Stable left adrenal nodule. 3.  Aortic Atherosclerosis (ICD10-I70.0).     08/21/2020 Imaging   Bone density is normal AP spine T score 0.8 Femoral neck on the left, T  score -0.2 Femoral neck on the right ,T score -0.4   10/17/2020 Imaging   1. Interval progression of peritoneal nodules along the left pelvic sidewall, concerning for progressive metastatic disease. 2. Small capsular lesion medial right liver and the apparent cystic lesion superior to the right kidney are similar to prior. 3. Tiny soft tissue nodule anterior aspect of the lateral left pelvis described as decreasing on the prior study has decreased further on today's exam. 4. Stable left adrenal nodule. This cannot be definitively characterized. 5. Tiny nonobstructing stone lower pole right kidney. 6. Aortic Atherosclerosis (ICD10-I70.0).   10/26/2020 Procedure   Successful placement of a right internal jugular approach power injectable Port-A-Cath. The catheter is ready for immediate use.       10/31/2020 -  Chemotherapy   Patient is on Treatment Plan : OVARIAN Paclitaxel T0,1,77,93 q28d     01/25/2021 Imaging   1. Signs of peritoneal disease  with scattered peritoneal nodules. The index lesions are either stable or decreased in size in the interval as detailed above. No new sites of disease. 2. Ventral pelvic wall hernia contains a nonobstructed loop of small bowel. 3. Aortic Atherosclerosis (ICD10-I70.0).       PHYSICAL EXAMINATION: ECOG PERFORMANCE STATUS: 1 - Symptomatic but completely ambulatory  Vitals:   03/19/21 0910  BP: 128/65  Pulse: (!) 59  Resp: 18  Temp: 98.5 F (36.9 C)  SpO2: 100%   Filed Weights   03/19/21 0910  Weight: 190 lb (86.2 kg)    GENERAL:alert, no distress and comfortable NEURO: alert & oriented x 3 with fluent speech  LABORATORY DATA:  I have reviewed the data as listed    Component Value Date/Time   NA 138 03/06/2021 1515   NA 139 11/30/2019 0913   NA 141 09/24/2013 1131   K 4.4 03/06/2021 1515   K 4.5 09/24/2013 1131   CL 105 03/06/2021 1515   CO2 24 03/06/2021 1515   CO2 24 09/24/2013 1131   GLUCOSE 97 03/06/2021 1515   GLUCOSE 90  09/24/2013 1131   BUN 34 (H) 03/06/2021 1515   BUN 32 (H) 11/30/2019 0913   BUN 21.2 09/24/2013 1131   CREATININE 1.27 (H) 03/06/2021 1515   CREATININE 1.1 09/24/2013 1131   CALCIUM 9.0 03/06/2021 1515   CALCIUM 9.5 09/24/2013 1131   PROT 6.4 (L) 03/06/2021 1515   PROT 7.0 11/30/2019 0913   PROT 6.8 09/24/2013 1131   ALBUMIN 3.6 03/06/2021 1515   ALBUMIN 4.3 11/30/2019 0913   ALBUMIN 3.9 09/24/2013 1131   AST 14 (L) 03/06/2021 1515   AST 23 09/24/2013 1131   ALT 15 03/06/2021 1515   ALT 27 09/24/2013 1131   ALKPHOS 58 03/06/2021 1515   ALKPHOS 81 09/24/2013 1131   BILITOT 0.3 03/06/2021 1515   BILITOT 0.53 09/24/2013 1131   GFRNONAA 47 (L) 03/06/2021 1515   GFRAA 55 (L) 01/24/2020 1530   GFRAA 57 (L) 12/21/2019 1259    No results found for: SPEP, UPEP  Lab Results  Component Value Date   WBC 6.5 03/06/2021   NEUTROABS 3.8 03/06/2021   HGB 12.3 03/06/2021   HCT 37.7 03/06/2021   MCV 98.7 03/06/2021   PLT 235 03/06/2021      Chemistry      Component Value Date/Time   NA 138 03/06/2021 1515   NA 139 11/30/2019 0913   NA 141 09/24/2013 1131   K 4.4 03/06/2021 1515   K 4.5 09/24/2013 1131   CL 105 03/06/2021 1515   CO2 24 03/06/2021 1515   CO2 24 09/24/2013 1131   BUN 34 (H) 03/06/2021 1515   BUN 32 (H) 11/30/2019 0913   BUN 21.2 09/24/2013 1131   CREATININE 1.27 (H) 03/06/2021 1515   CREATININE 1.1 09/24/2013 1131      Component Value Date/Time   CALCIUM 9.0 03/06/2021 1515   CALCIUM 9.5 09/24/2013 1131   ALKPHOS 58 03/06/2021 1515   ALKPHOS 81 09/24/2013 1131   AST 14 (L) 03/06/2021 1515   AST 23 09/24/2013 1131   ALT 15 03/06/2021 1515   ALT 27 09/24/2013 1131   BILITOT 0.3 03/06/2021 1515   BILITOT 0.53 09/24/2013 1131

## 2021-03-19 NOTE — Assessment & Plan Note (Signed)
She has experienced worsening peripheral neuropathy The patient thought that she can go back on tamoxifen I reviewed her oncologic history and prior treatment with the patient She had documented failure of tamoxifen in 2013 and in 2017 We discussed the risk and benefits of discontinuation of paclitaxel and switching her to other chemotherapy Ultimately, the patient will keep her appointment as scheduled

## 2021-03-19 NOTE — Assessment & Plan Note (Signed)
We had extensive discussions about the risk and benefits of gabapentin She has not started gabapentin We discussed the risk and benefits of discontinuation of paclitaxel Ultimately, she has made informed decision to pursue a trial of gabapentin to see if she can benefit from it

## 2021-03-20 ENCOUNTER — Encounter: Payer: Self-pay | Admitting: Hematology and Oncology

## 2021-03-20 ENCOUNTER — Inpatient Hospital Stay: Payer: 59

## 2021-03-20 VITALS — BP 128/64 | HR 62 | Temp 98.2°F | Resp 18

## 2021-03-20 DIAGNOSIS — G620 Drug-induced polyneuropathy: Secondary | ICD-10-CM | POA: Diagnosis not present

## 2021-03-20 DIAGNOSIS — N183 Chronic kidney disease, stage 3 unspecified: Secondary | ICD-10-CM | POA: Diagnosis not present

## 2021-03-20 DIAGNOSIS — C569 Malignant neoplasm of unspecified ovary: Secondary | ICD-10-CM | POA: Diagnosis not present

## 2021-03-20 DIAGNOSIS — Z7189 Other specified counseling: Secondary | ICD-10-CM

## 2021-03-20 DIAGNOSIS — Z5111 Encounter for antineoplastic chemotherapy: Secondary | ICD-10-CM | POA: Diagnosis not present

## 2021-03-20 LAB — CMP (CANCER CENTER ONLY)
ALT: 22 U/L (ref 0–44)
AST: 23 U/L (ref 15–41)
Albumin: 4.1 g/dL (ref 3.5–5.0)
Alkaline Phosphatase: 66 U/L (ref 38–126)
Anion gap: 7 (ref 5–15)
BUN: 27 mg/dL — ABNORMAL HIGH (ref 8–23)
CO2: 26 mmol/L (ref 22–32)
Calcium: 9 mg/dL (ref 8.9–10.3)
Chloride: 103 mmol/L (ref 98–111)
Creatinine: 1.28 mg/dL — ABNORMAL HIGH (ref 0.44–1.00)
GFR, Estimated: 47 mL/min — ABNORMAL LOW (ref >60)
Glucose, Bld: 121 mg/dL — ABNORMAL HIGH (ref 70–99)
Potassium: 4.5 mmol/L (ref 3.5–5.1)
Sodium: 136 mmol/L (ref 135–145)
Total Bilirubin: 0.7 mg/dL (ref 0.3–1.2)
Total Protein: 7.2 g/dL (ref 6.5–8.1)

## 2021-03-20 LAB — CBC WITH DIFFERENTIAL (CANCER CENTER ONLY)
Abs Immature Granulocytes: 0 10*3/uL (ref 0.00–0.07)
Basophils Absolute: 0.1 10*3/uL (ref 0.0–0.1)
Basophils Relative: 1 %
Eosinophils Absolute: 0.2 10*3/uL (ref 0.0–0.5)
Eosinophils Relative: 3 %
HCT: 41 % (ref 36.0–46.0)
Hemoglobin: 13.5 g/dL (ref 12.0–15.0)
Immature Granulocytes: 0 %
Lymphocytes Relative: 32 %
Lymphs Abs: 1.8 10*3/uL (ref 0.7–4.0)
MCH: 32.1 pg (ref 26.0–34.0)
MCHC: 32.9 g/dL (ref 30.0–36.0)
MCV: 97.4 fL (ref 80.0–100.0)
Monocytes Absolute: 0.6 10*3/uL (ref 0.1–1.0)
Monocytes Relative: 10 %
Neutro Abs: 3 10*3/uL (ref 1.7–7.7)
Neutrophils Relative %: 54 %
Platelet Count: 301 10*3/uL (ref 150–400)
RBC: 4.21 MIL/uL (ref 3.87–5.11)
RDW: 13.7 % (ref 11.5–15.5)
WBC Count: 5.7 10*3/uL (ref 4.0–10.5)
nRBC: 0 % (ref 0.0–0.2)

## 2021-03-20 MED ORDER — FAMOTIDINE 20 MG IN NS 100 ML IVPB
20.0000 mg | Freq: Once | INTRAVENOUS | Status: AC
  Administered 2021-03-20: 20 mg via INTRAVENOUS

## 2021-03-20 MED ORDER — SODIUM CHLORIDE 0.9 % IV SOLN
60.0000 mg/m2 | Freq: Once | INTRAVENOUS | Status: AC
  Administered 2021-03-20: 120 mg via INTRAVENOUS
  Filled 2021-03-20: qty 20

## 2021-03-20 MED ORDER — DIPHENHYDRAMINE HCL 50 MG/ML IJ SOLN
25.0000 mg | Freq: Once | INTRAMUSCULAR | Status: AC
  Administered 2021-03-20: 25 mg via INTRAVENOUS

## 2021-03-20 MED ORDER — SODIUM CHLORIDE 0.9 % IV SOLN: Freq: Once | INTRAVENOUS | Status: AC

## 2021-03-20 MED ORDER — DEXAMETHASONE SODIUM PHOSPHATE 10 MG/ML IJ SOLN
5.0000 mg | Freq: Once | INTRAMUSCULAR | Status: AC
  Administered 2021-03-20: 5 mg via INTRAVENOUS

## 2021-03-21 ENCOUNTER — Inpatient Hospital Stay: Payer: 59

## 2021-03-27 ENCOUNTER — Inpatient Hospital Stay: Payer: 59

## 2021-03-27 ENCOUNTER — Other Ambulatory Visit: Payer: Self-pay

## 2021-03-27 ENCOUNTER — Inpatient Hospital Stay: Payer: 59 | Attending: Hematology

## 2021-03-27 VITALS — BP 132/78 | HR 60 | Temp 98.2°F | Resp 18 | Wt 190.0 lb

## 2021-03-27 DIAGNOSIS — C786 Secondary malignant neoplasm of retroperitoneum and peritoneum: Secondary | ICD-10-CM | POA: Diagnosis not present

## 2021-03-27 DIAGNOSIS — N183 Chronic kidney disease, stage 3 unspecified: Secondary | ICD-10-CM | POA: Diagnosis not present

## 2021-03-27 DIAGNOSIS — G620 Drug-induced polyneuropathy: Secondary | ICD-10-CM | POA: Insufficient documentation

## 2021-03-27 DIAGNOSIS — Z79899 Other long term (current) drug therapy: Secondary | ICD-10-CM | POA: Insufficient documentation

## 2021-03-27 DIAGNOSIS — Z5111 Encounter for antineoplastic chemotherapy: Secondary | ICD-10-CM | POA: Insufficient documentation

## 2021-03-27 DIAGNOSIS — Z90722 Acquired absence of ovaries, bilateral: Secondary | ICD-10-CM | POA: Insufficient documentation

## 2021-03-27 DIAGNOSIS — Z9079 Acquired absence of other genital organ(s): Secondary | ICD-10-CM | POA: Insufficient documentation

## 2021-03-27 DIAGNOSIS — T451X5A Adverse effect of antineoplastic and immunosuppressive drugs, initial encounter: Secondary | ICD-10-CM | POA: Diagnosis not present

## 2021-03-27 DIAGNOSIS — Z90710 Acquired absence of both cervix and uterus: Secondary | ICD-10-CM | POA: Diagnosis not present

## 2021-03-27 DIAGNOSIS — C569 Malignant neoplasm of unspecified ovary: Secondary | ICD-10-CM | POA: Insufficient documentation

## 2021-03-27 DIAGNOSIS — Z7189 Other specified counseling: Secondary | ICD-10-CM

## 2021-03-27 LAB — CMP (CANCER CENTER ONLY)
ALT: 18 U/L (ref 0–44)
AST: 16 U/L (ref 15–41)
Albumin: 3.8 g/dL (ref 3.5–5.0)
Alkaline Phosphatase: 67 U/L (ref 38–126)
Anion gap: 9 (ref 5–15)
BUN: 25 mg/dL — ABNORMAL HIGH (ref 8–23)
CO2: 25 mmol/L (ref 22–32)
Calcium: 8.9 mg/dL (ref 8.9–10.3)
Chloride: 104 mmol/L (ref 98–111)
Creatinine: 1.15 mg/dL — ABNORMAL HIGH (ref 0.44–1.00)
GFR, Estimated: 53 mL/min — ABNORMAL LOW
Glucose, Bld: 88 mg/dL (ref 70–99)
Potassium: 4.6 mmol/L (ref 3.5–5.1)
Sodium: 138 mmol/L (ref 135–145)
Total Bilirubin: 0.4 mg/dL (ref 0.3–1.2)
Total Protein: 6.9 g/dL (ref 6.5–8.1)

## 2021-03-27 LAB — CBC WITH DIFFERENTIAL (CANCER CENTER ONLY)
Abs Immature Granulocytes: 0.02 10*3/uL (ref 0.00–0.07)
Basophils Absolute: 0.1 10*3/uL (ref 0.0–0.1)
Basophils Relative: 1 %
Eosinophils Absolute: 0.1 10*3/uL (ref 0.0–0.5)
Eosinophils Relative: 2 %
HCT: 38.6 % (ref 36.0–46.0)
Hemoglobin: 13 g/dL (ref 12.0–15.0)
Immature Granulocytes: 0 %
Lymphocytes Relative: 34 %
Lymphs Abs: 2.1 10*3/uL (ref 0.7–4.0)
MCH: 32.6 pg (ref 26.0–34.0)
MCHC: 33.7 g/dL (ref 30.0–36.0)
MCV: 96.7 fL (ref 80.0–100.0)
Monocytes Absolute: 0.4 10*3/uL (ref 0.1–1.0)
Monocytes Relative: 7 %
Neutro Abs: 3.4 10*3/uL (ref 1.7–7.7)
Neutrophils Relative %: 56 %
Platelet Count: 286 10*3/uL (ref 150–400)
RBC: 3.99 MIL/uL (ref 3.87–5.11)
RDW: 13.4 % (ref 11.5–15.5)
WBC Count: 6.1 10*3/uL (ref 4.0–10.5)
nRBC: 0 % (ref 0.0–0.2)

## 2021-03-27 MED ORDER — DEXAMETHASONE SODIUM PHOSPHATE 10 MG/ML IJ SOLN
5.0000 mg | Freq: Once | INTRAMUSCULAR | Status: AC
  Administered 2021-03-27: 5 mg via INTRAVENOUS
  Filled 2021-03-27: qty 1

## 2021-03-27 MED ORDER — SODIUM CHLORIDE 0.9 % IV SOLN
60.0000 mg/m2 | Freq: Once | INTRAVENOUS | Status: AC
  Administered 2021-03-27: 120 mg via INTRAVENOUS
  Filled 2021-03-27: qty 20

## 2021-03-27 MED ORDER — SODIUM CHLORIDE 0.9 % IV SOLN: Freq: Once | INTRAVENOUS | Status: AC

## 2021-03-27 MED ORDER — SODIUM CHLORIDE 0.9% FLUSH
10.0000 mL | Freq: Once | INTRAVENOUS | Status: AC
  Administered 2021-03-27: 10 mL

## 2021-03-27 MED ORDER — DIPHENHYDRAMINE HCL 50 MG/ML IJ SOLN
25.0000 mg | Freq: Once | INTRAMUSCULAR | Status: AC
  Administered 2021-03-27: 25 mg via INTRAVENOUS
  Filled 2021-03-27: qty 1

## 2021-03-27 MED ORDER — FAMOTIDINE 20 MG IN NS 100 ML IVPB
20.0000 mg | Freq: Once | INTRAVENOUS | Status: AC
  Administered 2021-03-27: 20 mg via INTRAVENOUS
  Filled 2021-03-27: qty 100

## 2021-03-27 MED ORDER — HEPARIN SOD (PORK) LOCK FLUSH 100 UNIT/ML IV SOLN
500.0000 [IU] | Freq: Once | INTRAVENOUS | Status: AC | PRN
  Administered 2021-03-27: 500 [IU]

## 2021-03-27 MED ORDER — SODIUM CHLORIDE 0.9% FLUSH
10.0000 mL | INTRAVENOUS | Status: DC | PRN
  Administered 2021-03-27: 10 mL

## 2021-03-27 NOTE — Patient Instructions (Signed)
[facility]ICAL [facility]COL[facility]   Discharge Instructions: Thank you for choosing [facility] to provide your oncology and hematology care.   If you have a lab appointment with the Cancer Center, please go directly to the Cancer Center and check in at the registration area.   Wear comfortable clothing and clothing appropriate for easy access to any Portacath or PICC line.   We strive to give you quality time with your provider. You may need to reschedule your appointment if you arrive late (15 or more minutes).  Arriving late affects you and other patients whose appointments are after yours.  Also, if you miss three or more appointments without notifying the office, you may be dismissed from the clinic at the provider's discretion.      For prescription refill requests, have your pharmacy contact our office and allow 72 hours for refills to be completed.    Today you received the following chemotherapy and/or immunotherapy agents: paclitaxel.      To help prevent nausea and vomiting after your treatment, we encourage you to take your nausea medication as directed.  BELOW ARE SYMPTOMS THAT SHOULD BE REPORTED IMMEDIATELY: *FEVER GREATER THAN 100.4 F (38 C) OR HIGHER *CHILLS OR SWEATING *NAUSEA AND VOMITING THAT IS NOT CONTROLLED WITH YOUR NAUSEA MEDICATION *UNUSUAL SHORTNESS OF BREATH *UNUSUAL BRUISING OR BLEEDING *URINARY PROBLEMS (pain or burning when urinating, or frequent urination) *BOWEL PROBLEMS (unusual diarrhea, constipation, pain near the anus) TENDERNESS IN MOUTH AND THROAT WITH OR WITHOUT PRESENCE OF ULCERS (sore throat, sores in mouth, or a toothache) UNUSUAL RASH, SWELLING OR PAIN  UNUSUAL VAGINAL DISCHARGE OR ITCHING   Items with * indicate a potential emergency and should be followed up as soon as possible or go to the Emergency Department if any problems should occur.  Please show the CHEMOTHERAPY ALERT CARD or IMMUNOTHERAPY ALERT CARD at check-in  to the Emergency Department and triage nurse.  Should you have questions after your visit or need to cancel or reschedule your appointment, please contact [facility]  Dept: [phone]  and follow the prompts.  Office hours are 8:00 a.m. to 4:30 p.m. Monday - Friday. Please note that voicemails left after 4:00 p.m. may not be returned until the following business day.  We are closed weekends and major holidays. You have access to a nurse at all times for urgent questions. Please call the main number to the clinic Dept: [phone] and follow the prompts.   For any non-urgent questions, you may also contact your provider using MyChart. We now offer e-Visits for anyone 18 and older to request care online for non-urgent symptoms. For details visit [email or web address].   Also download the MyChart app! Go to the app store, search "MyChart", open the app, select [facility], and log in with your MyChart username and password.  Due to Covid, a mask is required upon entering the hospital/clinic. If you do not have a mask, one will be given to you upon arrival. For doctor visits, patients may have 1 support person aged 18 or older with them. For treatment visits, patients cannot have anyone with them due to current Covid guidelines and our immunocompromised population.   

## 2021-03-28 ENCOUNTER — Inpatient Hospital Stay: Payer: 59

## 2021-03-28 DIAGNOSIS — R300 Dysuria: Secondary | ICD-10-CM | POA: Diagnosis not present

## 2021-03-28 DIAGNOSIS — N3091 Cystitis, unspecified with hematuria: Secondary | ICD-10-CM | POA: Diagnosis not present

## 2021-04-03 ENCOUNTER — Ambulatory Visit (INDEPENDENT_AMBULATORY_CARE_PROVIDER_SITE_OTHER): Payer: 59 | Admitting: Family Medicine

## 2021-04-03 DIAGNOSIS — H903 Sensorineural hearing loss, bilateral: Secondary | ICD-10-CM | POA: Diagnosis not present

## 2021-04-03 DIAGNOSIS — J340 Abscess, furuncle and carbuncle of nose: Secondary | ICD-10-CM | POA: Diagnosis not present

## 2021-04-04 ENCOUNTER — Ambulatory Visit (INDEPENDENT_AMBULATORY_CARE_PROVIDER_SITE_OTHER): Payer: 59 | Admitting: Family Medicine

## 2021-04-04 DIAGNOSIS — E8881 Metabolic syndrome: Secondary | ICD-10-CM

## 2021-04-04 DIAGNOSIS — R4589 Other symptoms and signs involving emotional state: Secondary | ICD-10-CM

## 2021-04-10 ENCOUNTER — Inpatient Hospital Stay: Payer: 59

## 2021-04-10 ENCOUNTER — Encounter: Payer: Self-pay | Admitting: Hematology and Oncology

## 2021-04-10 ENCOUNTER — Other Ambulatory Visit (INDEPENDENT_AMBULATORY_CARE_PROVIDER_SITE_OTHER): Payer: Self-pay | Admitting: Family Medicine

## 2021-04-10 ENCOUNTER — Ambulatory Visit (INDEPENDENT_AMBULATORY_CARE_PROVIDER_SITE_OTHER): Payer: 59 | Admitting: Family Medicine

## 2021-04-10 ENCOUNTER — Other Ambulatory Visit: Payer: Self-pay

## 2021-04-10 ENCOUNTER — Other Ambulatory Visit (HOSPITAL_COMMUNITY): Payer: Self-pay

## 2021-04-10 ENCOUNTER — Inpatient Hospital Stay (HOSPITAL_BASED_OUTPATIENT_CLINIC_OR_DEPARTMENT_OTHER): Payer: 59 | Admitting: Hematology and Oncology

## 2021-04-10 ENCOUNTER — Encounter (INDEPENDENT_AMBULATORY_CARE_PROVIDER_SITE_OTHER): Payer: Self-pay | Admitting: Family Medicine

## 2021-04-10 VITALS — BP 106/61 | HR 65 | Temp 97.3°F | Ht 66.0 in | Wt 191.0 lb

## 2021-04-10 DIAGNOSIS — G620 Drug-induced polyneuropathy: Secondary | ICD-10-CM | POA: Diagnosis not present

## 2021-04-10 DIAGNOSIS — E8881 Metabolic syndrome: Secondary | ICD-10-CM

## 2021-04-10 DIAGNOSIS — Z79899 Other long term (current) drug therapy: Secondary | ICD-10-CM | POA: Diagnosis not present

## 2021-04-10 DIAGNOSIS — C569 Malignant neoplasm of unspecified ovary: Secondary | ICD-10-CM

## 2021-04-10 DIAGNOSIS — R4589 Other symptoms and signs involving emotional state: Secondary | ICD-10-CM

## 2021-04-10 DIAGNOSIS — T451X5A Adverse effect of antineoplastic and immunosuppressive drugs, initial encounter: Secondary | ICD-10-CM | POA: Diagnosis not present

## 2021-04-10 DIAGNOSIS — Z9079 Acquired absence of other genital organ(s): Secondary | ICD-10-CM | POA: Diagnosis not present

## 2021-04-10 DIAGNOSIS — Z9189 Other specified personal risk factors, not elsewhere classified: Secondary | ICD-10-CM

## 2021-04-10 DIAGNOSIS — E669 Obesity, unspecified: Secondary | ICD-10-CM

## 2021-04-10 DIAGNOSIS — Z5111 Encounter for antineoplastic chemotherapy: Secondary | ICD-10-CM | POA: Diagnosis not present

## 2021-04-10 DIAGNOSIS — N183 Chronic kidney disease, stage 3 unspecified: Secondary | ICD-10-CM

## 2021-04-10 DIAGNOSIS — C786 Secondary malignant neoplasm of retroperitoneum and peritoneum: Secondary | ICD-10-CM | POA: Diagnosis not present

## 2021-04-10 DIAGNOSIS — Z90710 Acquired absence of both cervix and uterus: Secondary | ICD-10-CM | POA: Diagnosis not present

## 2021-04-10 DIAGNOSIS — Z6833 Body mass index (BMI) 33.0-33.9, adult: Secondary | ICD-10-CM | POA: Diagnosis not present

## 2021-04-10 DIAGNOSIS — Z7189 Other specified counseling: Secondary | ICD-10-CM

## 2021-04-10 LAB — CBC WITH DIFFERENTIAL (CANCER CENTER ONLY)
Abs Immature Granulocytes: 0.02 10*3/uL (ref 0.00–0.07)
Basophils Absolute: 0.1 10*3/uL (ref 0.0–0.1)
Basophils Relative: 1 %
Eosinophils Absolute: 0.1 10*3/uL (ref 0.0–0.5)
Eosinophils Relative: 2 %
HCT: 38.6 % (ref 36.0–46.0)
Hemoglobin: 12.9 g/dL (ref 12.0–15.0)
Immature Granulocytes: 0 %
Lymphocytes Relative: 29 %
Lymphs Abs: 1.9 10*3/uL (ref 0.7–4.0)
MCH: 32.5 pg (ref 26.0–34.0)
MCHC: 33.4 g/dL (ref 30.0–36.0)
MCV: 97.2 fL (ref 80.0–100.0)
Monocytes Absolute: 0.7 10*3/uL (ref 0.1–1.0)
Monocytes Relative: 11 %
Neutro Abs: 3.7 10*3/uL (ref 1.7–7.7)
Neutrophils Relative %: 57 %
Platelet Count: 305 10*3/uL (ref 150–400)
RBC: 3.97 MIL/uL (ref 3.87–5.11)
RDW: 13.9 % (ref 11.5–15.5)
WBC Count: 6.5 10*3/uL (ref 4.0–10.5)
nRBC: 0 % (ref 0.0–0.2)

## 2021-04-10 LAB — CMP (CANCER CENTER ONLY)
ALT: 19 U/L (ref 0–44)
AST: 19 U/L (ref 15–41)
Albumin: 4 g/dL (ref 3.5–5.0)
Alkaline Phosphatase: 65 U/L (ref 38–126)
Anion gap: 5 (ref 5–15)
BUN: 21 mg/dL (ref 8–23)
CO2: 29 mmol/L (ref 22–32)
Calcium: 9.1 mg/dL (ref 8.9–10.3)
Chloride: 103 mmol/L (ref 98–111)
Creatinine: 1.13 mg/dL — ABNORMAL HIGH (ref 0.44–1.00)
GFR, Estimated: 54 mL/min — ABNORMAL LOW (ref >60)
Glucose, Bld: 92 mg/dL (ref 70–99)
Potassium: 4.2 mmol/L (ref 3.5–5.1)
Sodium: 137 mmol/L (ref 135–145)
Total Bilirubin: 0.4 mg/dL (ref 0.3–1.2)
Total Protein: 6.7 g/dL (ref 6.5–8.1)

## 2021-04-10 MED ORDER — BUPROPION HCL ER (SR) 200 MG PO TB12
200.0000 mg | ORAL_TABLET | Freq: Two times a day (BID) | ORAL | 0 refills | Status: DC
  Filled 2021-04-10: qty 60, 30d supply, fill #0

## 2021-04-10 MED ORDER — METFORMIN HCL 500 MG PO TABS
ORAL_TABLET | ORAL | 0 refills | Status: DC
  Filled 2021-04-10: qty 90, 30d supply, fill #0

## 2021-04-10 MED ORDER — LIDOCAINE-PRILOCAINE 2.5-2.5 % EX CREA
1.0000 "application " | TOPICAL_CREAM | CUTANEOUS | 3 refills | Status: DC | PRN
  Filled 2021-04-10: qty 30, 30d supply, fill #0

## 2021-04-10 NOTE — Progress Notes (Signed)
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   All questions were answered. The patient knows to call the clinic with any problems, questions or concerns. The total time spent in the appointment was 20 minutes encounter with patients including review of chart and various tests results, discussions about plan of care and coordination of care plan   Heath Lark, MD 04/10/2021 12:21 PM  INTERVAL HISTORY: Please see below for problem oriented charting. she returns for treatment follow-up on paclitaxel for recurrent granulosa cell tumor of the ovary Her neuropathy is stable with gabapentin She has slight constipation but resolved with laxatives  REVIEW OF SYSTEMS:   Constitutional: Denies fevers, chills or abnormal weight loss Eyes: Denies blurriness of vision Ears, nose, mouth, throat, and face: Denies mucositis or sore throat Respiratory: Denies cough, dyspnea or wheezes Cardiovascular: Denies palpitation, chest discomfort or lower extremity swelling Gastrointestinal:  Denies nausea, heartburn or change in bowel habits Skin: Denies abnormal skin rashes Lymphatics: Denies new lymphadenopathy or easy bruising Behavioral/Psych: Mood is stable, no new changes  All other systems were reviewed with the patient and are negative.  I have reviewed the past  medical history, past surgical history, social history and family history with the patient and they are unchanged from previous note.  ALLERGIES:  is allergic to codeine, hydrocodone-acetaminophen, oxycodone, thimerosal, tramadol hcl, ciprofloxacin, daypro [oxaprozin], levofloxacin, stadol [butorphanol tartrate], and sulfa antibiotics.  MEDICATIONS:  Current Outpatient Medications  Medication Sig Dispense Refill   lidocaine-prilocaine (EMLA) cream Apply topically as needed. 30 g 3   acetaminophen (TYLENOL) 500 MG tablet Take 1,000 mg by mouth every 6 (six) hours as needed for moderate pain.     Biotin 5 MG TABS Take 5 mg by mouth daily.     buPROPion (WELLBUTRIN SR) 200 MG 12 hr tablet Take 1 tablet by mouth 2 times daily. (need office visit for refills) 60 tablet 0   Ca Phosphate-Cholecalciferol (EQL CALCIUM GUMMIES) 250-400 MG-UNIT CHEW Chew 1 tablet by mouth 2 (two) times daily.     escitalopram (LEXAPRO) 20 MG tablet TAKE 1 TABLET BY MOUTH ONCE A DAY (Patient taking differently: Take 20 mg by mouth daily. Taking 10 mg daily, temporarily due to LFT'S.) 90 tablet 3   gabapentin (NEURONTIN) 300 MG capsule Take 1 capsule (300 mg total) by mouth 2 (two) times daily. 60 capsule 3   lidocaine-prilocaine (EMLA) cream Apply to affected area once (Patient taking differently: Apply 1 application topically daily as needed (port access).) 30 g 3   LORazepam (ATIVAN) 0.5 MG tablet Take 1 tablet (0.5 mg total) by mouth 2 (two) times daily as needed for anxiety. 30 tablet 0   metFORMIN (GLUCOPHAGE) 500 MG tablet Take 1 tablet by mouth every morning and 2 tablets every evening as directed (need office visit for refills) 90 tablet 0   mirabegron ER (MYRBETRIQ) 50 MG TB24 tablet Take 1 tablet by mouth once daily  90 tablet 3   mupirocin ointment (BACTROBAN) 2 % Apply to the inside of each nostril twice daily for five (5) days. After application, press sides of nose together and gently massage. 22 g 1   Omeprazole  20 MG TBEC Take 1 tablet by mouth once a day. 90 tablet 3   ondansetron (ZOFRAN) 8 MG tablet Take 1 tablet (8 mg total) by mouth every 8 (eight) hours as needed for nausea or vomiting. 60 tablet 0   Polyethyl Glycol-Propyl Glycol (SYSTANE OP) Place 1 drop into both eyes daily as needed (dry eyes).     Prenatal MV & Min w/FA-DHA (PRENATAL GUMMIES) 0.18-25 MG CHEW      Probiotic Product (ALIGN PO) Take 1 tablet by mouth daily.      prochlorperazine (COMPAZINE) 10 MG tablet Take 1 tablet by mouth every 6 hours as needed for nausea or vomiting. 30 tablet 1   No current facility-administered medications for this visit.    SUMMARY OF ONCOLOGIC HISTORY: Oncology History Overview Note  2018 - Core biopsy for ER/PR and Foundation One testing performed. Unfortunately, no sufficient tissue for Foundation One testing. ER was 50% and PR 90%. Progressed on carboplatin, exemestane, Avastin,Tamoxifen/Megace and letrozole, mixed response on Lupron  AMH: 06/23/19: 108 05/26/19: 9.04 01/05/19: 6.84 09/02/18: 3.72 06/01/18: 3.84 02/24/18: 2.6 11/21/17: 3.26 08/26/17: 1.77 05/27/17: 2.12 01/28/17: 2.47 06/10/16: 2.68 02/06/16: 1.84 05/12/15: 3.27 10/03/14: 2.12  Inhibin B 09/22/2019: 95.9 05/26/19: 90.2 01/05/19: 92 09/02/18: 70.9 06/04/18: 70 02/24/18: 79.5 11/21/17: 85.8 08/26/17: 65.6 05/27/17: 58.9 01/28/17: 198 06/10/16: 118.1 02/06/16: 62.4 05/12/15: 64.6 10/03/14: 58   Malignant granulosa cell tumor of ovary ([place])  1994 Initial Diagnosis   1994   2002 Relapse/Recurrence   Upper abdominal recurrence, resected.     - 09/2000 Chemotherapy   6 cycles of IP cisplatin and etoposide    02/2009 Relapse/Recurrence   CT showed increased size of nodules in pelvis   2010 Surgery   Exlap with section of tumor nodules near cecum and left pelvic sidewall. Tumor: ER negative, PR positive    Treatment Plan Change   Alternated 2 week courses of Megace and Tamoxifen - ended 03/2012   03/2012 PET scan   CT -  progressive disease   2013 Treatment Plan Change   Letrozole   01/2016 Imaging   MRI showed progressive disease   2017 Treatment Plan Change   Two weeks of alternating Tamoxifen 69m daily and then Megace 466mTID   08/2016 Imaging   MRI showed progressive disease with peritoneal implants near liver, in pelvis   01/2017 Treatment Plan Change   Lupron 11.25 q 3 months   11/2017 Imaging   Overall mixed response.   Mixed cystic/solid lesions in the left pelvis are mildly improved.   Cystic peritoneal disease, including the dominant lesion along the posterior right hepatic lobe, is mildly progressed.   Subcapsular lesion along the posterior right hepatic lobe is unchanged.   03/2018 Imaging   MRI A/P: Mixed response of individual peritoneal metastases in the pelvis, as described above. Overall, there has been no significant change in bulk of disease.   Stable cystic peritoneal metastatic disease along the capsular surfaces of the liver and spleen.   No new sites of metastatic disease identified within the abdomen or pelvis.   08/2018 Imaging   MRI A/P: Status post hysterectomy and bilateral salpingo-oophorectomy.   Mixed cystic/solid peritoneal implants in the abdomen/pelvis, as above. Dominant cystic implant along the posterior liver surface is  mildly increased. Remaining lesions are overall grossly unchanged.   No new lesions are identified.   01/28/2019 Imaging   Mri A/P: 1. Relatively similar appearance of peritoneal metastasis. A posterior right hepatic capsular based lesion is similar to minimally decreased in size. Left pelvic implants are primarily similar with possible enlargement of an anterior high left pelvic cystic implant. No new disease identified. 2.  Aortic Atherosclerosis (ICD10-I70.0). 3. Hepatic steatosis. 4. Left adrenal adenoma.   06/02/2019 Imaging   MRI 1. Potential slight enlargement of dominant cystic area and solid component, associated with rind  like signal variation on T2 along the inferior right hepatic margin, also potentially slightly increased. Findings may still be within the realm of measurement and technical variation. Close attention on follow-up. 2. Subtle cystic changes along the cephalad margin of the spleen are difficult to see on previous imaging, perhaps new compared with prior imaging studies. 3. Pelvic implants and left lower quadrant lesion with similar size, of the area along the left iliac vasculature may be slightly larger than on the prior study. 4. Signs of extensive retroperitoneal and pelvic lymphadenectomy. 5. Hepatic steatosis. 6. 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     06/08/2019 Cancer Staging   Staging form: [place], AJCC 7th Edition - Clinical: Stage IIIC (rT2, N1, M0) - Signed by Heath Lark, MD on 06/08/2019    06/10/2019 Imaging   1. 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 These findings are slightly worsened in comparison to a remote prior CT examination dated 10/04/2014.   2.  No evidence of metastatic disease in the chest.   3. Status post hysterectomy, pelvic and retroperitoneal lymph node dissection, and ventral hernia mesh repair.   4.  Hepatic steatosis.   5.  Aortic Atherosclerosis (ICD10-I70.0).   06/24/2019 - 09/02/2019 Chemotherapy   The patient had carboplatin for chemotherapy treatment.     09/02/2019 Tumor Marker   Patient's tumor was tested for the following markers: Inhibin B Results of the tumor marker test revealed 94   09/23/2019 Imaging   1. Interval progression of the soft tissue lesions in the anterior left pelvis, likely peritoneal implants, compatible with disease progression. Remaining sites of apparent disease along the liver capsule and  posterior spleen are stable 2. Stable 2 cm left adrenal adenoma. 3. Hepatic steatosis. 4. Right-side predominant colonic diverticulosis without diverticulitis. 5. Aortic Atherosclerosis (ICD10-I70.0).   12/23/2019 Imaging   1. Slight interval increase in size of a mixed solid and cystic nodule in the left hemipelvis measuring 3.0 x 2.9 cm, previously 2.7 x 2.1 cm. 2. Interval decrease in size of a nodule in the left paracolic gutter measuring 1.0 x 0.8 cm, previously 2.1 x 1.6 cm. 3. Stable peritoneal nodules in the hepatorenal recess and at the inferior tip of the spleen. 4. Unchanged nodule or lymph node overlying the left external iliac artery. 5. Enlargement of dominant left pelvic nodule is concerning for disease progression despite interval decrease in size of a nodule in the left paracolic gutter and stability of other nodules. 6. No evidence of metastatic disease in the chest. 7. Stable, benign left adrenal adenoma. 8. Ventral hernia mesh repair with a small component of recurrent hernia inferiorly, containing a single nonobstructed loop of small bowel. 9. Hepatic steatosis. 10. Aortic Atherosclerosis (ICD10-I70.0).  12/23/2019 Tumor Marker   Patient's tumor was tested for the following markers: Inhibin B Results of the tumor marker test revealed 94.9   01/25/2020 Tumor Marker   Patient's tumor was tested for the following markers: Inhibin B Results of the tumor marker test revealed 116.7   02/20/2020 Genetic Testing   Negative genetic testing: no pathogenic variants detected in [facility] Multi-Cancer Panel.  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  The report date is February 20, 2020.   The Multi-Cancer Panel offered by [facility] includes sequencing and/or deletion duplication testing of the following 85 genes: AIP, ALK, APC, ATM, AXIN2,BAP1,  BARD1, BLM, BMPR1A, BRCA1, BRCA2, BRIP1, CASR, CDC73, CDH1, CDK4, CDKN1B, CDKN1C, CDKN2A (p14ARF), CDKN2A  (p16INK4a), CEBPA, CHEK2, CTNNA1, DICER1, DIS3L2, EGFR (c.2369C>T, p.Thr790Met variant only), EPCAM (Deletion/duplication testing only), FH, FLCN, GATA2, GPC3, GREM1 (Promoter region deletion/duplication testing only), HOXB13 (c.251G>A, p.Gly84Glu), HRAS, KIT, MAX, MEN1, MET, MITF (c.952G>A, p.Glu318Lys variant only), MLH1, MSH2, MSH3, MSH6, MUTYH, NBN, NF1, NF2, NTHL1, PALB2, PDGFRA, PHOX2B, PMS2, POLD1, POLE, POT1, PRKAR1A, PTCH1, PTEN, RAD50, RAD51C, RAD51D, RB1, RECQL4, RET, RNF43, RUNX1, SDHAF2, SDHA (sequence changes only), SDHB, SDHC, SDHD, SMAD4, SMARCA4, SMARCB1, SMARCE1, STK11, SUFU, TERC, TERT, TMEM127, TP53, TSC1, TSC2, VHL, WRN and WT1.    03/02/2020 Tumor Marker   Patient's tumor was tested for the following markers: Inhibin B Results of the tumor marker test revealed 80.8   04/17/2020 Imaging   1. Overall, exam is stable. Multiple peritoneal nodules are again seen. The index nodule in the left lower quadrant is mildly increased in size in the interval. The lesion within the anterior left pelvis has decreased in size in the interval. There has also been decrease in size of cystic lesion within the a hepatorenal recess. The remaining peritoneal lesions are unchanged. No new lesions identified. 2. Stable left adrenal nodule. 3.  Aortic Atherosclerosis (ICD10-I70.0).     08/21/2020 Imaging   Bone density is normal AP spine T score 0.8 Femoral neck on the left, T score -0.2 Femoral neck on the right ,T score -0.4   10/17/2020 Imaging   1. Interval progression of peritoneal nodules along the left pelvic sidewall, concerning for progressive metastatic disease. 2. Small capsular lesion medial right liver and the apparent cystic lesion superior to the right kidney are similar to prior. 3. Tiny soft tissue nodule anterior aspect of the lateral left pelvis described as decreasing on the prior study has decreased further on today's exam. 4. Stable left adrenal nodule. This cannot be  definitively characterized. 5. Tiny nonobstructing stone lower pole right kidney. 6. Aortic Atherosclerosis (ICD10-I70.0).   10/26/2020 Procedure   Successful placement of a right internal jugular approach power injectable Port-A-Cath. The catheter is ready for immediate use.       10/31/2020 -  Chemotherapy   Patient is on Treatment Plan : OVARIAN Paclitaxel X5,4,00,86 q28d     01/25/2021 Imaging   1. Signs of peritoneal disease with scattered peritoneal nodules. The index lesions are either stable or decreased in size in the interval as detailed above. No new sites of disease. 2. Ventral pelvic wall hernia contains a nonobstructed loop of small bowel. 3. Aortic Atherosclerosis (ICD10-I70.0).       PHYSICAL EXAMINATION: ECOG PERFORMANCE STATUS: 1 - Symptomatic but completely ambulatory  Vitals:   04/10/21 1036  BP: (!) 117/49  Pulse: (!) 56  Resp: 18  Temp: (!) 97.5 F (36.4 C)  SpO2: 98%   Filed Weights   04/10/21 1036  Weight:  196 lb 1.6 oz (89 kg)    GENERAL:alert, no distress and comfortable SKIN: skin color, texture, turgor are normal, no rashes or significant lesions EYES: normal, Conjunctiva are pink and non-injected, sclera clear OROPHARYNX:no exudate, no erythema and lips, buccal mucosa, and tongue normal  NECK: supple, thyroid normal size, non-tender, without nodularity LYMPH:  no palpable lymphadenopathy in the cervical, axillary or inguinal LUNGS: clear to auscultation and percussion with normal breathing effort HEART: regular rate & rhythm and no murmurs and no lower extremity edema ABDOMEN:abdomen soft, non-tender and normal bowel sounds.  Well-healed surgical scar with palpable thickening under the scar Musculoskeletal:no cyanosis of digits and no clubbing  NEURO: alert & oriented x 3 with fluent speech, no focal motor/sensory deficits  LABORATORY DATA:  I have reviewed the data as listed    Component Value Date/Time   NA 137 04/10/2021 1008   NA 139  11/30/2019 0913   NA 141 09/24/2013 1131   K 4.2 04/10/2021 1008   K 4.5 09/24/2013 1131   CL 103 04/10/2021 1008   CO2 29 04/10/2021 1008   CO2 24 09/24/2013 1131   GLUCOSE 92 04/10/2021 1008   GLUCOSE 90 09/24/2013 1131   BUN 21 04/10/2021 1008   BUN 32 (H) 11/30/2019 0913   BUN 21.2 09/24/2013 1131   CREATININE 1.13 (H) 04/10/2021 1008   CREATININE 1.1 09/24/2013 1131   CALCIUM 9.1 04/10/2021 1008   CALCIUM 9.5 09/24/2013 1131   PROT 6.7 04/10/2021 1008   PROT 7.0 11/30/2019 0913   PROT 6.8 09/24/2013 1131   ALBUMIN 4.0 04/10/2021 1008   ALBUMIN 4.3 11/30/2019 0913   ALBUMIN 3.9 09/24/2013 1131   AST 19 04/10/2021 1008   AST 23 09/24/2013 1131   ALT 19 04/10/2021 1008   ALT 27 09/24/2013 1131   ALKPHOS 65 04/10/2021 1008   ALKPHOS 81 09/24/2013 1131   BILITOT 0.4 04/10/2021 1008   BILITOT 0.53 09/24/2013 1131   GFRNONAA 54 (L) 04/10/2021 1008   GFRAA 55 (L) 01/24/2020 1530   GFRAA 57 (L) 12/21/2019 1259    No results found for: SPEP, UPEP  Lab Results  Component Value Date   WBC 6.5 04/10/2021   NEUTROABS 3.7 04/10/2021   HGB 12.9 04/10/2021   HCT 38.6 04/10/2021   MCV 97.2 04/10/2021   PLT 305 04/10/2021      Chemistry      Component Value Date/Time   NA 137 04/10/2021 1008   NA 139 11/30/2019 0913   NA 141 09/24/2013 1131   K 4.2 04/10/2021 1008   K 4.5 09/24/2013 1131   CL 103 04/10/2021 1008   CO2 29 04/10/2021 1008   CO2 24 09/24/2013 1131   BUN 21 04/10/2021 1008   BUN 32 (H) 11/30/2019 0913   BUN 21.2 09/24/2013 1131   CREATININE 1.13 (H) 04/10/2021 1008   CREATININE 1.1 09/24/2013 1131      Component Value Date/Time   CALCIUM 9.1 04/10/2021 1008   CALCIUM 9.5 09/24/2013 1131   ALKPHOS 65 04/10/2021 1008   ALKPHOS 81 09/24/2013 1131   AST 19 04/10/2021 1008   AST 23 09/24/2013 1131   ALT 19 04/10/2021 1008   ALT 27 09/24/2013 1131   BILITOT 0.4 04/10/2021 1008   BILITOT 0.53 09/24/2013 1131

## 2021-04-10 NOTE — Assessment & Plan Note (Signed)
I recommend the patient to continue gabapentin

## 2021-04-10 NOTE — Assessment & Plan Note (Signed)
She has experienced some intermittent worsening peripheral neuropathy Continue medical management with gabapentin We will proceed with paclitaxel as scheduled She will be due for another imaging study early next year

## 2021-04-10 NOTE — Progress Notes (Signed)
Chief Complaint:   OBESITY [name] is here to discuss her progress with her obesity treatment plan along with follow-up of her obesity related diagnoses. [name] is on the Category 2 Plan or keeping a food journal and adhering to recommended goals of 1300-1400 calories and 90 grams of protein daily and states she is following her eating plan approximately 75% of the time. [name] states she is walking and swimming for 60 minutes 1 time per week.  Today's visit was #: 64 Starting weight: 209 lbs Starting date: 04/29/2018 Today's weight: 191 lbs Today's date: 04/10/2021 Total lbs lost to date: 18 Total lbs lost since last in-office visit: 0  Interim History: [name] has had a lot of temptations especially at work and with family members. She has struggled more to stay on track. She is still journaling on and off, and she is trying to increase her lean protein.  Subjective:   1. Insulin resistance [name] has been out of metformin, but she requests a refill today. She is working on increasing lean protein and decreasing simple carbohydrates.  2. Depressed mood with emotional eating [name] is working on decreasing emotional eating behaviors. She has had extra challenges at her work.  3. At risk for impaired metabolic function [name] is at increased risk for impaired metabolic function if protein decreases.  Assessment/Plan:   1. Insulin resistance [name] will continue metformin, and we will refill for 1 month. She will continue to work on weight loss, exercise, and decreasing simple carbohydrates to help decrease the risk of diabetes. [name] agreed to follow-up with [name] as directed to closely monitor her progress.  - metFORMIN (GLUCOPHAGE) 500 MG tablet; Take 1 tablet by mouth every morning and 2 tablets every evening as directed (need office visit for refills)  Dispense: 90 tablet; Refill: 0  2. Depressed mood with emotional eating We will refill Wellbutrin SR for 1 month. [name] will continue  to work on strategies to set herself up for success. Orders and follow up as documented in patient record.   - buPROPion (WELLBUTRIN SR) 200 MG 12 hr tablet; Take 1 tablet by mouth 2 times daily. (need office visit for refills)  Dispense: 60 tablet; Refill: 0  3. At risk for impaired metabolic function [name] was given approximately 15 minutes of impaired  metabolic function prevention counseling today. We discussed intensive lifestyle modifications today with an emphasis on specific nutrition and exercise instructions and strategies.   Repetitive spaced learning was employed today to elicit superior memory formation and behavioral change.  4. Obesity BMI today is 38 [name] is currently in the action stage of change. As such, her goal is to continue with weight loss efforts. She has agreed to keeping a food journal and adhering to recommended goals of 1300-1400 calories and 90+ grams of protein daily.   Protein recipes and ideas were discussed.  Exercise goals: As is.  Behavioral modification strategies: increasing lean protein intake and dealing with family or coworker sabotage.  [name] has agreed to follow-up with our clinic in 3 to 4 weeks. She was informed of the importance of frequent follow-up visits to maximize her success with intensive lifestyle modifications for her multiple health conditions.   Objective:   Blood pressure 106/61, pulse 65, temperature (!) 97.3 F (36.3 C), height 5\' 6"  (1.676 m), weight 191 lb (86.6 kg), SpO2 99 %. Body mass index is 30.83 kg/m.  General: Cooperative, alert, well developed, in no acute distress. HEENT: Conjunctivae and lids unremarkable. Cardiovascular:  Regular rhythm.  Lungs: Normal work of breathing. Neurologic: No focal deficits.   Lab Results  Component Value Date   CREATININE 1.13 (H) 04/10/2021   BUN 21 04/10/2021   NA 137 04/10/2021   K 4.2 04/10/2021   CL 103 04/10/2021   CO2 29 04/10/2021   Lab Results  Component Value Date    ALT 19 04/10/2021   AST 19 04/10/2021   ALKPHOS 65 04/10/2021   BILITOT 0.4 04/10/2021   Lab Results  Component Value Date   HGBA1C 5.2 11/30/2019   HGBA1C 5.3 05/31/2019   HGBA1C 5.2 11/19/2018   HGBA1C 5.5 04/29/2018   Lab Results  Component Value Date   INSULIN 23.8 11/30/2019   INSULIN 16.5 05/31/2019   INSULIN 17.6 11/19/2018   INSULIN 25.8 (H) 04/29/2018   Lab Results  Component Value Date   TSH 1.150 02/23/2021   Lab Results  Component Value Date   CHOL 224 (H) 11/30/2019   HDL 52 11/30/2019   LDLCALC 136 (H) 11/30/2019   TRIG 201 (H) 11/30/2019   Lab Results  Component Value Date   VD25OH 59.0 11/30/2019   VD25OH 54.0 05/31/2019   VD25OH 42.8 11/19/2018   Lab Results  Component Value Date   WBC 6.5 04/10/2021   HGB 12.9 04/10/2021   HCT 38.6 04/10/2021   MCV 97.2 04/10/2021   PLT 305 04/10/2021   No results found for: IRON, TIBC, FERRITIN  Attestation Statements:   Reviewed by clinician on day of visit: allergies, medications, problem list, medical history, surgical history, family history, social history, and previous encounter notes.   I, Trixie Dredge, am acting as transcriptionist for Dennard Nip, MD.  I have reviewed the above documentation for accuracy and completeness, and I agree with the above. -  Dennard Nip, MD

## 2021-04-10 NOTE — Assessment & Plan Note (Signed)
Renal function is stable  We discussed importance of oral fluid intake

## 2021-04-11 ENCOUNTER — Inpatient Hospital Stay: Payer: 59

## 2021-04-11 ENCOUNTER — Other Ambulatory Visit (HOSPITAL_COMMUNITY): Payer: Self-pay

## 2021-04-12 ENCOUNTER — Other Ambulatory Visit (HOSPITAL_COMMUNITY): Payer: Self-pay

## 2021-04-12 MED ORDER — OMEPRAZOLE 20 MG PO CPDR
20.0000 mg | DELAYED_RELEASE_CAPSULE | Freq: Every day | ORAL | 3 refills | Status: DC
  Filled 2021-04-12: qty 90, 90d supply, fill #0

## 2021-04-13 ENCOUNTER — Other Ambulatory Visit: Payer: Self-pay

## 2021-04-13 ENCOUNTER — Inpatient Hospital Stay: Payer: 59

## 2021-04-13 VITALS — BP 128/57 | HR 66 | Temp 98.7°F | Resp 17

## 2021-04-13 DIAGNOSIS — C786 Secondary malignant neoplasm of retroperitoneum and peritoneum: Secondary | ICD-10-CM | POA: Diagnosis not present

## 2021-04-13 DIAGNOSIS — G620 Drug-induced polyneuropathy: Secondary | ICD-10-CM | POA: Diagnosis not present

## 2021-04-13 DIAGNOSIS — Z79899 Other long term (current) drug therapy: Secondary | ICD-10-CM | POA: Diagnosis not present

## 2021-04-13 DIAGNOSIS — C569 Malignant neoplasm of unspecified ovary: Secondary | ICD-10-CM

## 2021-04-13 DIAGNOSIS — Z5111 Encounter for antineoplastic chemotherapy: Secondary | ICD-10-CM | POA: Diagnosis not present

## 2021-04-13 DIAGNOSIS — T451X5A Adverse effect of antineoplastic and immunosuppressive drugs, initial encounter: Secondary | ICD-10-CM | POA: Diagnosis not present

## 2021-04-13 DIAGNOSIS — N183 Chronic kidney disease, stage 3 unspecified: Secondary | ICD-10-CM | POA: Diagnosis not present

## 2021-04-13 DIAGNOSIS — Z7189 Other specified counseling: Secondary | ICD-10-CM

## 2021-04-13 DIAGNOSIS — Z9079 Acquired absence of other genital organ(s): Secondary | ICD-10-CM | POA: Diagnosis not present

## 2021-04-13 DIAGNOSIS — Z90710 Acquired absence of both cervix and uterus: Secondary | ICD-10-CM | POA: Diagnosis not present

## 2021-04-13 MED ORDER — SODIUM CHLORIDE 0.9% FLUSH: 10.0000 mL | Freq: Once | INTRAVENOUS | Status: DC | PRN

## 2021-04-13 MED ORDER — HEPARIN SOD (PORK) LOCK FLUSH 100 UNIT/ML IV SOLN: 250.0000 [IU] | Freq: Once | INTRAVENOUS | Status: DC | PRN

## 2021-04-13 MED ORDER — SODIUM CHLORIDE 0.9% FLUSH
10.0000 mL | Freq: Once | INTRAVENOUS | Status: AC
  Administered 2021-04-13: 11:00:00 10 mL

## 2021-04-13 MED ORDER — SODIUM CHLORIDE 0.9 % IV SOLN: Freq: Once | INTRAVENOUS | Status: DC

## 2021-04-13 MED ORDER — SODIUM CHLORIDE 0.9 % IV SOLN
60.0000 mg/m2 | Freq: Once | INTRAVENOUS | Status: AC
  Administered 2021-04-13: 13:00:00 120 mg via INTRAVENOUS
  Filled 2021-04-13: qty 20

## 2021-04-13 MED ORDER — SODIUM CHLORIDE 0.9 % IV SOLN: Freq: Once | INTRAVENOUS | Status: AC

## 2021-04-13 MED ORDER — SODIUM CHLORIDE 0.9% FLUSH
10.0000 mL | INTRAVENOUS | Status: DC | PRN
  Administered 2021-04-13: 14:00:00 10 mL

## 2021-04-13 MED ORDER — ONDANSETRON HCL 4 MG/2ML IJ SOLN
8.0000 mg | Freq: Once | INTRAMUSCULAR | Status: AC
  Administered 2021-04-13: 12:00:00 8 mg via INTRAVENOUS
  Filled 2021-04-13: qty 4

## 2021-04-13 MED ORDER — HEPARIN SOD (PORK) LOCK FLUSH 100 UNIT/ML IV SOLN: 500.0000 [IU] | Freq: Once | INTRAVENOUS | Status: DC | PRN

## 2021-04-13 MED ORDER — DIPHENHYDRAMINE HCL 50 MG/ML IJ SOLN
25.0000 mg | Freq: Once | INTRAMUSCULAR | Status: AC
Start: 2021-04-13 — End: 2021-04-13
  Administered 2021-04-13: 12:00:00 25 mg via INTRAVENOUS
  Filled 2021-04-13: qty 1

## 2021-04-13 MED ORDER — FAMOTIDINE 20 MG IN NS 100 ML IVPB
20.0000 mg | Freq: Once | INTRAVENOUS | Status: AC
  Administered 2021-04-13: 12:00:00 20 mg via INTRAVENOUS
  Filled 2021-04-13: qty 100

## 2021-04-13 MED ORDER — SODIUM CHLORIDE 0.9% FLUSH: 3.0000 mL | Freq: Once | INTRAVENOUS | Status: DC | PRN

## 2021-04-13 MED ORDER — DEXAMETHASONE SODIUM PHOSPHATE 10 MG/ML IJ SOLN
5.0000 mg | Freq: Once | INTRAMUSCULAR | Status: AC
  Administered 2021-04-13: 12:00:00 5 mg via INTRAVENOUS
  Filled 2021-04-13: qty 1

## 2021-04-13 MED ORDER — ALTEPLASE 2 MG IJ SOLR: 2.0000 mg | Freq: Once | INTRAMUSCULAR | Status: DC | PRN

## 2021-04-13 MED ORDER — HEPARIN SOD (PORK) LOCK FLUSH 100 UNIT/ML IV SOLN
500.0000 [IU] | Freq: Once | INTRAVENOUS | Status: AC | PRN
  Administered 2021-04-13: 14:00:00 500 [IU]

## 2021-04-13 NOTE — Patient Instructions (Signed)
[facility]  Discharge Instructions: Thank you for choosing [facility] to provide your oncology and hematology care.   If you have a lab appointment with the [facility], please go directly to the [facility] and check in at the registration area.   Wear comfortable clothing and clothing appropriate for easy access to any Portacath or PICC line.   We strive to give you quality time with your provider. You may need to reschedule your appointment if you arrive late (15 or more minutes).  Arriving late affects you and other patients whose appointments are after yours.  Also, if you miss three or more appointments without notifying the office, you may be dismissed from the clinic at the providers discretion.      For prescription refill requests, have your pharmacy contact our office and allow 72 hours for refills to be completed.    Today you received the following chemotherapy and/or immunotherapy agents: Taxol      To help prevent nausea and vomiting after your treatment, we encourage you to take your nausea medication as directed.  BELOW ARE SYMPTOMS THAT SHOULD BE REPORTED IMMEDIATELY: *FEVER GREATER THAN 100.4 F (38 C) OR HIGHER *CHILLS OR SWEATING *NAUSEA AND VOMITING THAT IS NOT CONTROLLED WITH YOUR NAUSEA MEDICATION *UNUSUAL SHORTNESS OF BREATH *UNUSUAL BRUISING OR BLEEDING *URINARY PROBLEMS (pain or burning when urinating, or frequent urination) *BOWEL PROBLEMS (unusual diarrhea, constipation, pain near the anus) TENDERNESS IN MOUTH AND THROAT WITH OR WITHOUT PRESENCE OF ULCERS (sore throat, sores in mouth, or a toothache) UNUSUAL RASH, SWELLING OR PAIN  UNUSUAL VAGINAL DISCHARGE OR ITCHING   Items with * indicate a potential emergency and should be followed up as soon as possible or go to the Emergency Department if any problems should occur.  Please show the CHEMOTHERAPY ALERT CARD or IMMUNOTHERAPY ALERT CARD at check-in to the  Emergency Department and triage nurse.  Should you have questions after your visit or need to cancel or reschedule your appointment, please contact [facility]  Dept: [phone]  and follow the prompts.  Office hours are 8:00 a.m. to 4:30 p.m. Monday - Friday. Please note that voicemails left after 4:00 p.m. may not be returned until the following business day.  We are closed weekends and major holidays. You have access to a nurse at all times for urgent questions. Please call the main number to the clinic Dept: [phone] and follow the prompts.   For any non-urgent questions, you may also contact your provider using MyChart. We now offer e-Visits for anyone 24 and older to request care online for non-urgent symptoms. For details visit [email or web address].   Also download the MyChart app! Go to the app store, search "MyChart", open the app, select [facility], and log in with your MyChart username and password.  Due to Covid, a mask is required upon entering the hospital/clinic. If you do not have a mask, one will be given to you upon arrival. For doctor visits, patients may have 1 support person aged 48 or older with them. For treatment visits, patients cannot have anyone with them due to current Covid guidelines and our immunocompromised population.

## 2021-04-17 ENCOUNTER — Inpatient Hospital Stay: Payer: 59

## 2021-04-18 ENCOUNTER — Inpatient Hospital Stay: Payer: 59

## 2021-04-20 ENCOUNTER — Inpatient Hospital Stay: Payer: 59

## 2021-04-20 ENCOUNTER — Other Ambulatory Visit: Payer: Self-pay

## 2021-04-20 VITALS — BP 113/51 | HR 73 | Temp 98.2°F | Resp 18 | Ht 66.0 in | Wt 191.2 lb

## 2021-04-20 DIAGNOSIS — C569 Malignant neoplasm of unspecified ovary: Secondary | ICD-10-CM

## 2021-04-20 DIAGNOSIS — Z5111 Encounter for antineoplastic chemotherapy: Secondary | ICD-10-CM | POA: Diagnosis not present

## 2021-04-20 DIAGNOSIS — C786 Secondary malignant neoplasm of retroperitoneum and peritoneum: Secondary | ICD-10-CM | POA: Diagnosis not present

## 2021-04-20 DIAGNOSIS — N183 Chronic kidney disease, stage 3 unspecified: Secondary | ICD-10-CM | POA: Diagnosis not present

## 2021-04-20 DIAGNOSIS — Z7189 Other specified counseling: Secondary | ICD-10-CM

## 2021-04-20 DIAGNOSIS — T451X5A Adverse effect of antineoplastic and immunosuppressive drugs, initial encounter: Secondary | ICD-10-CM | POA: Diagnosis not present

## 2021-04-20 DIAGNOSIS — Z9079 Acquired absence of other genital organ(s): Secondary | ICD-10-CM | POA: Diagnosis not present

## 2021-04-20 DIAGNOSIS — G620 Drug-induced polyneuropathy: Secondary | ICD-10-CM | POA: Diagnosis not present

## 2021-04-20 DIAGNOSIS — Z90710 Acquired absence of both cervix and uterus: Secondary | ICD-10-CM | POA: Diagnosis not present

## 2021-04-20 DIAGNOSIS — Z79899 Other long term (current) drug therapy: Secondary | ICD-10-CM | POA: Diagnosis not present

## 2021-04-20 LAB — CMP (CANCER CENTER ONLY)
ALT: 15 U/L (ref 0–44)
AST: 15 U/L (ref 15–41)
Albumin: 4.1 g/dL (ref 3.5–5.0)
Alkaline Phosphatase: 58 U/L (ref 38–126)
Anion gap: 7 (ref 5–15)
BUN: 31 mg/dL — ABNORMAL HIGH (ref 8–23)
CO2: 28 mmol/L (ref 22–32)
Calcium: 9.9 mg/dL (ref 8.9–10.3)
Chloride: 102 mmol/L (ref 98–111)
Creatinine: 1.19 mg/dL — ABNORMAL HIGH (ref 0.44–1.00)
GFR, Estimated: 51 mL/min — ABNORMAL LOW (ref >60)
Glucose, Bld: 90 mg/dL (ref 70–99)
Potassium: 4.5 mmol/L (ref 3.5–5.1)
Sodium: 137 mmol/L (ref 135–145)
Total Bilirubin: 0.4 mg/dL (ref 0.3–1.2)
Total Protein: 7.2 g/dL (ref 6.5–8.1)

## 2021-04-20 LAB — CBC WITH DIFFERENTIAL (CANCER CENTER ONLY)
Abs Immature Granulocytes: 0.03 10*3/uL (ref 0.00–0.07)
Basophils Absolute: 0.1 10*3/uL (ref 0.0–0.1)
Basophils Relative: 1 %
Eosinophils Absolute: 0.2 10*3/uL (ref 0.0–0.5)
Eosinophils Relative: 5 %
HCT: 40.2 % (ref 36.0–46.0)
Hemoglobin: 13.5 g/dL (ref 12.0–15.0)
Immature Granulocytes: 1 %
Lymphocytes Relative: 29 %
Lymphs Abs: 1.4 10*3/uL (ref 0.7–4.0)
MCH: 31.9 pg (ref 26.0–34.0)
MCHC: 33.6 g/dL (ref 30.0–36.0)
MCV: 95 fL (ref 80.0–100.0)
Monocytes Absolute: 0.3 10*3/uL (ref 0.1–1.0)
Monocytes Relative: 6 %
Neutro Abs: 2.9 10*3/uL (ref 1.7–7.7)
Neutrophils Relative %: 58 %
Platelet Count: 331 10*3/uL (ref 150–400)
RBC: 4.23 MIL/uL (ref 3.87–5.11)
RDW: 13.3 % (ref 11.5–15.5)
WBC Count: 5 10*3/uL (ref 4.0–10.5)
nRBC: 0 % (ref 0.0–0.2)

## 2021-04-20 MED ORDER — SODIUM CHLORIDE 0.9% FLUSH
10.0000 mL | INTRAVENOUS | Status: DC | PRN
  Administered 2021-04-20: 14:00:00 10 mL

## 2021-04-20 MED ORDER — SODIUM CHLORIDE 0.9 % IV SOLN
60.0000 mg/m2 | Freq: Once | INTRAVENOUS | Status: AC
  Administered 2021-04-20: 13:00:00 120 mg via INTRAVENOUS
  Filled 2021-04-20: qty 20

## 2021-04-20 MED ORDER — FAMOTIDINE 20 MG IN NS 100 ML IVPB
20.0000 mg | Freq: Once | INTRAVENOUS | Status: AC
  Administered 2021-04-20: 12:00:00 20 mg via INTRAVENOUS
  Filled 2021-04-20: qty 100

## 2021-04-20 MED ORDER — HEPARIN SOD (PORK) LOCK FLUSH 100 UNIT/ML IV SOLN
500.0000 [IU] | Freq: Once | INTRAVENOUS | Status: AC | PRN
  Administered 2021-04-20: 14:00:00 500 [IU]

## 2021-04-20 MED ORDER — DIPHENHYDRAMINE HCL 50 MG/ML IJ SOLN
25.0000 mg | Freq: Once | INTRAMUSCULAR | Status: AC
Start: 2021-04-20 — End: 2021-04-20
  Administered 2021-04-20: 12:00:00 25 mg via INTRAVENOUS
  Filled 2021-04-20: qty 1

## 2021-04-20 MED ORDER — SODIUM CHLORIDE 0.9 % IV SOLN: Freq: Once | INTRAVENOUS | Status: AC

## 2021-04-20 MED ORDER — SODIUM CHLORIDE 0.9% FLUSH
10.0000 mL | Freq: Once | INTRAVENOUS | Status: AC
  Administered 2021-04-20: 12:00:00 10 mL

## 2021-04-20 MED ORDER — DEXAMETHASONE SODIUM PHOSPHATE 10 MG/ML IJ SOLN
5.0000 mg | Freq: Once | INTRAMUSCULAR | Status: AC
  Administered 2021-04-20: 12:00:00 5 mg via INTRAVENOUS
  Filled 2021-04-20: qty 1

## 2021-04-20 NOTE — Patient Instructions (Signed)
[facility]   Discharge Instructions: Thank you for choosing [facility] to provide your oncology and hematology care.   If you have a lab appointment with the [facility], please go directly to [facility] and check in at the registration area.   Wear comfortable clothing and clothing appropriate for easy access to any Portacath or PICC line.   We strive to give you quality time with your provider. You may need to reschedule your appointment if you arrive late (15 or more minutes).  Arriving late affects you and other patients whose appointments are after yours.  Also, if you miss three or more appointments without notifying the office, you may be dismissed from the clinic at the providers discretion.      For prescription refill requests, have your pharmacy contact our office and allow 72 hours for refills to be completed.    Today you received the following chemotherapy and/or immunotherapy agents: Paclitaxel (Taxol)      To help prevent nausea and vomiting after your treatment, we encourage you to take your nausea medication as directed.  BELOW ARE SYMPTOMS THAT SHOULD BE REPORTED IMMEDIATELY: *FEVER GREATER THAN 100.4 F (38 C) OR HIGHER *CHILLS OR SWEATING *NAUSEA AND VOMITING THAT IS NOT CONTROLLED WITH YOUR NAUSEA MEDICATION *UNUSUAL SHORTNESS OF BREATH *UNUSUAL BRUISING OR BLEEDING *URINARY PROBLEMS (pain or burning when urinating, or frequent urination) *BOWEL PROBLEMS (unusual diarrhea, constipation, pain near the anus) TENDERNESS IN MOUTH AND THROAT WITH OR WITHOUT PRESENCE OF ULCERS (sore throat, sores in mouth, or a toothache) UNUSUAL RASH, SWELLING OR PAIN  UNUSUAL VAGINAL DISCHARGE OR ITCHING   Items with * indicate a potential emergency and should be followed up as soon as possible or go to the Emergency Department if any problems should occur.  Please show the CHEMOTHERAPY ALERT CARD or IMMUNOTHERAPY ALERT CARD at  check-in to the Emergency Department and triage nurse.  Should you have questions after your visit or need to cancel or reschedule your appointment, please contact [facility]  Dept: [phone]  and follow the prompts.  Office hours are 8:00 a.m. to 4:30 p.m. Monday - Friday. Please note that voicemails left after 4:00 p.m. may not be returned until the following business day.  We are closed weekends and major holidays. You have access to a nurse at all times for urgent questions. Please call the main number to the clinic Dept: [phone] and follow the prompts.   For any non-urgent questions, you may also contact your provider using MyChart. We now offer e-Visits for anyone 50 and older to request care online for non-urgent symptoms. For details visit [email or web address].   Also download the MyChart app! Go to the app store, search "MyChart", open the app, select [facility], and log in with your MyChart username and password.  Due to Covid, a mask is required upon entering the hospital/clinic. If you do not have a mask, one will be given to you upon arrival. For doctor visits, patients may have 1 support person aged 53 or older with them. For treatment visits, patients cannot have anyone with them due to current Covid guidelines and our immunocompromised population.

## 2021-04-23 ENCOUNTER — Other Ambulatory Visit (HOSPITAL_COMMUNITY): Payer: Self-pay

## 2021-04-23 MED FILL — Escitalopram Oxalate Tab 20 MG (Base Equiv): ORAL | 90 days supply | Qty: 90 | Fill #2 | Status: AC

## 2021-04-26 ENCOUNTER — Other Ambulatory Visit (HOSPITAL_COMMUNITY): Payer: Self-pay

## 2021-05-03 ENCOUNTER — Other Ambulatory Visit (HOSPITAL_COMMUNITY): Payer: Self-pay

## 2021-05-03 ENCOUNTER — Other Ambulatory Visit: Payer: Self-pay

## 2021-05-03 ENCOUNTER — Ambulatory Visit (INDEPENDENT_AMBULATORY_CARE_PROVIDER_SITE_OTHER): Payer: 59 | Admitting: Family Medicine

## 2021-05-03 ENCOUNTER — Encounter (INDEPENDENT_AMBULATORY_CARE_PROVIDER_SITE_OTHER): Payer: Self-pay | Admitting: Family Medicine

## 2021-05-03 VITALS — BP 126/73 | HR 64 | Temp 97.4°F | Ht 66.0 in | Wt 192.0 lb

## 2021-05-03 DIAGNOSIS — R4589 Other symptoms and signs involving emotional state: Secondary | ICD-10-CM

## 2021-05-03 DIAGNOSIS — E8881 Metabolic syndrome: Secondary | ICD-10-CM

## 2021-05-03 DIAGNOSIS — Z6831 Body mass index (BMI) 31.0-31.9, adult: Secondary | ICD-10-CM | POA: Diagnosis not present

## 2021-05-03 DIAGNOSIS — Z6833 Body mass index (BMI) 33.0-33.9, adult: Secondary | ICD-10-CM

## 2021-05-03 DIAGNOSIS — Z9189 Other specified personal risk factors, not elsewhere classified: Secondary | ICD-10-CM

## 2021-05-03 DIAGNOSIS — E669 Obesity, unspecified: Secondary | ICD-10-CM | POA: Diagnosis not present

## 2021-05-03 MED ORDER — BUPROPION HCL ER (SR) 200 MG PO TB12
200.0000 mg | ORAL_TABLET | Freq: Two times a day (BID) | ORAL | 0 refills | Status: DC
  Filled 2021-05-03: qty 60, 30d supply, fill #0

## 2021-05-03 MED ORDER — METFORMIN HCL 500 MG PO TABS
ORAL_TABLET | ORAL | 0 refills | Status: DC
  Filled 2021-05-03: qty 90, 30d supply, fill #0

## 2021-05-04 ENCOUNTER — Inpatient Hospital Stay: Payer: 59 | Attending: Hematology

## 2021-05-04 ENCOUNTER — Inpatient Hospital Stay: Payer: 59

## 2021-05-04 ENCOUNTER — Other Ambulatory Visit (HOSPITAL_COMMUNITY): Payer: Self-pay

## 2021-05-04 VITALS — BP 122/63 | HR 58 | Temp 98.1°F | Resp 17

## 2021-05-04 DIAGNOSIS — Z5111 Encounter for antineoplastic chemotherapy: Secondary | ICD-10-CM | POA: Diagnosis not present

## 2021-05-04 DIAGNOSIS — N390 Urinary tract infection, site not specified: Secondary | ICD-10-CM | POA: Diagnosis not present

## 2021-05-04 DIAGNOSIS — C569 Malignant neoplasm of unspecified ovary: Secondary | ICD-10-CM | POA: Diagnosis not present

## 2021-05-04 DIAGNOSIS — Z7189 Other specified counseling: Secondary | ICD-10-CM

## 2021-05-04 DIAGNOSIS — N183 Chronic kidney disease, stage 3 unspecified: Secondary | ICD-10-CM | POA: Diagnosis not present

## 2021-05-04 DIAGNOSIS — C786 Secondary malignant neoplasm of retroperitoneum and peritoneum: Secondary | ICD-10-CM | POA: Insufficient documentation

## 2021-05-04 LAB — CBC WITH DIFFERENTIAL (CANCER CENTER ONLY)
Abs Immature Granulocytes: 0.01 10*3/uL (ref 0.00–0.07)
Basophils Absolute: 0.1 10*3/uL (ref 0.0–0.1)
Basophils Relative: 1 %
Eosinophils Absolute: 0.1 10*3/uL (ref 0.0–0.5)
Eosinophils Relative: 3 %
HCT: 39.3 % (ref 36.0–46.0)
Hemoglobin: 13.3 g/dL (ref 12.0–15.0)
Immature Granulocytes: 0 %
Lymphocytes Relative: 27 %
Lymphs Abs: 1.4 10*3/uL (ref 0.7–4.0)
MCH: 32 pg (ref 26.0–34.0)
MCHC: 33.8 g/dL (ref 30.0–36.0)
MCV: 94.5 fL (ref 80.0–100.0)
Monocytes Absolute: 0.5 10*3/uL (ref 0.1–1.0)
Monocytes Relative: 10 %
Neutro Abs: 3.1 10*3/uL (ref 1.7–7.7)
Neutrophils Relative %: 59 %
Platelet Count: 317 10*3/uL (ref 150–400)
RBC: 4.16 MIL/uL (ref 3.87–5.11)
RDW: 13.9 % (ref 11.5–15.5)
WBC Count: 5.2 10*3/uL (ref 4.0–10.5)
nRBC: 0 % (ref 0.0–0.2)

## 2021-05-04 LAB — CMP (CANCER CENTER ONLY)
ALT: 14 U/L (ref 0–44)
AST: 14 U/L — ABNORMAL LOW (ref 15–41)
Albumin: 4 g/dL (ref 3.5–5.0)
Alkaline Phosphatase: 65 U/L (ref 38–126)
Anion gap: 8 (ref 5–15)
BUN: 23 mg/dL (ref 8–23)
CO2: 26 mmol/L (ref 22–32)
Calcium: 9.4 mg/dL (ref 8.9–10.3)
Chloride: 104 mmol/L (ref 98–111)
Creatinine: 1.14 mg/dL — ABNORMAL HIGH (ref 0.44–1.00)
GFR, Estimated: 54 mL/min — ABNORMAL LOW (ref >60)
Glucose, Bld: 90 mg/dL (ref 70–99)
Potassium: 4.3 mmol/L (ref 3.5–5.1)
Sodium: 138 mmol/L (ref 135–145)
Total Bilirubin: 0.5 mg/dL (ref 0.3–1.2)
Total Protein: 6.9 g/dL (ref 6.5–8.1)

## 2021-05-04 MED ORDER — DIPHENHYDRAMINE HCL 50 MG/ML IJ SOLN
25.0000 mg | Freq: Once | INTRAMUSCULAR | Status: AC
  Administered 2021-05-04: 25 mg via INTRAVENOUS
  Filled 2021-05-04: qty 1

## 2021-05-04 MED ORDER — SODIUM CHLORIDE 0.9 % IV SOLN
60.0000 mg/m2 | Freq: Once | INTRAVENOUS | Status: AC
  Administered 2021-05-04: 120 mg via INTRAVENOUS
  Filled 2021-05-04: qty 20

## 2021-05-04 MED ORDER — DEXAMETHASONE SODIUM PHOSPHATE 10 MG/ML IJ SOLN
5.0000 mg | Freq: Once | INTRAMUSCULAR | Status: AC
  Administered 2021-05-04: 5 mg via INTRAVENOUS
  Filled 2021-05-04: qty 1

## 2021-05-04 MED ORDER — SODIUM CHLORIDE 0.9% FLUSH
10.0000 mL | INTRAVENOUS | Status: DC | PRN
  Administered 2021-05-04: 10 mL

## 2021-05-04 MED ORDER — HEPARIN SOD (PORK) LOCK FLUSH 100 UNIT/ML IV SOLN
500.0000 [IU] | Freq: Once | INTRAVENOUS | Status: AC | PRN
  Administered 2021-05-04: 500 [IU]

## 2021-05-04 MED ORDER — SODIUM CHLORIDE 0.9 % IV SOLN: Freq: Once | INTRAVENOUS | Status: AC

## 2021-05-04 MED ORDER — FAMOTIDINE 20 MG IN NS 100 ML IVPB
20.0000 mg | Freq: Once | INTRAVENOUS | Status: AC
  Administered 2021-05-04: 20 mg via INTRAVENOUS
  Filled 2021-05-04: qty 100

## 2021-05-04 NOTE — Patient Instructions (Signed)
[facility]  Discharge Instructions: Thank you for choosing [facility] to provide your oncology and hematology care.   If you have a lab appointment with the [facility], please go directly to the [facility] and check in at the registration area.   Wear comfortable clothing and clothing appropriate for easy access to any Portacath or PICC line.   We strive to give you quality time with your provider. You may need to reschedule your appointment if you arrive late (15 or more minutes).  Arriving late affects you and other patients whose appointments are after yours.  Also, if you miss three or more appointments without notifying the office, you may be dismissed from the clinic at the providers discretion.      For prescription refill requests, have your pharmacy contact our office and allow 72 hours for refills to be completed.    Today you received the following chemotherapy and/or immunotherapy agents: Taxol   To help prevent nausea and vomiting after your treatment, we encourage you to take your nausea medication as directed.  BELOW ARE SYMPTOMS THAT SHOULD BE REPORTED IMMEDIATELY: *FEVER GREATER THAN 100.4 F (38 C) OR HIGHER *CHILLS OR SWEATING *NAUSEA AND VOMITING THAT IS NOT CONTROLLED WITH YOUR NAUSEA MEDICATION *UNUSUAL SHORTNESS OF BREATH *UNUSUAL BRUISING OR BLEEDING *URINARY PROBLEMS (pain or burning when urinating, or frequent urination) *BOWEL PROBLEMS (unusual diarrhea, constipation, pain near the anus) TENDERNESS IN MOUTH AND THROAT WITH OR WITHOUT PRESENCE OF ULCERS (sore throat, sores in mouth, or a toothache) UNUSUAL RASH, SWELLING OR PAIN  UNUSUAL VAGINAL DISCHARGE OR ITCHING   Items with * indicate a potential emergency and should be followed up as soon as possible or go to the Emergency Department if any problems should occur.  Please show the CHEMOTHERAPY ALERT CARD or IMMUNOTHERAPY ALERT CARD at check-in to the  Emergency Department and triage nurse.  Should you have questions after your visit or need to cancel or reschedule your appointment, please contact [facility]  Dept: [phone]  and follow the prompts.  Office hours are 8:00 a.m. to 4:30 p.m. Monday - Friday. Please note that voicemails left after 4:00 p.m. may not be returned until the following business day.  We are closed weekends and major holidays. You have access to a nurse at all times for urgent questions. Please call the main number to the clinic Dept: [phone] and follow the prompts.   For any non-urgent questions, you may also contact your provider using MyChart. We now offer e-Visits for anyone 51 and older to request care online for non-urgent symptoms. For details visit [email or web address].   Also download the MyChart app! Go to the app store, search "MyChart", open the app, select , and log in with your MyChart username and password.  Due to Covid, a mask is required upon entering the hospital/clinic. If you do not have a mask, one will be given to you upon arrival. For doctor visits, patients may have 1 support person aged 72 or older with them. For treatment visits, patients cannot have anyone with them due to current Covid guidelines and our immunocompromised population.

## 2021-05-07 ENCOUNTER — Telehealth: Payer: Self-pay

## 2021-05-07 ENCOUNTER — Other Ambulatory Visit: Payer: Self-pay

## 2021-05-07 ENCOUNTER — Other Ambulatory Visit: Payer: Self-pay | Admitting: Hematology and Oncology

## 2021-05-07 ENCOUNTER — Inpatient Hospital Stay: Payer: 59

## 2021-05-07 DIAGNOSIS — R300 Dysuria: Secondary | ICD-10-CM

## 2021-05-07 DIAGNOSIS — N3000 Acute cystitis without hematuria: Secondary | ICD-10-CM

## 2021-05-07 DIAGNOSIS — N390 Urinary tract infection, site not specified: Secondary | ICD-10-CM | POA: Diagnosis not present

## 2021-05-07 DIAGNOSIS — C569 Malignant neoplasm of unspecified ovary: Secondary | ICD-10-CM

## 2021-05-07 DIAGNOSIS — Z5111 Encounter for antineoplastic chemotherapy: Secondary | ICD-10-CM | POA: Diagnosis not present

## 2021-05-07 DIAGNOSIS — C786 Secondary malignant neoplasm of retroperitoneum and peritoneum: Secondary | ICD-10-CM | POA: Diagnosis not present

## 2021-05-07 DIAGNOSIS — N183 Chronic kidney disease, stage 3 unspecified: Secondary | ICD-10-CM | POA: Diagnosis not present

## 2021-05-07 DIAGNOSIS — Z7189 Other specified counseling: Secondary | ICD-10-CM

## 2021-05-07 LAB — URINALYSIS, COMPLETE (UACMP) WITH MICROSCOPIC
Bilirubin Urine: NEGATIVE
Glucose, UA: NEGATIVE mg/dL
Ketones, ur: NEGATIVE mg/dL
Nitrite: NEGATIVE
Protein, ur: NEGATIVE mg/dL
Specific Gravity, Urine: 1.024 (ref 1.005–1.030)
WBC, UA: >50 WBC/hpf — ABNORMAL HIGH (ref 0–5)
pH: 5 (ref 5.0–8.0)

## 2021-05-07 LAB — CBC WITH DIFFERENTIAL (CANCER CENTER ONLY)
Abs Immature Granulocytes: 0.01 10*3/uL (ref 0.00–0.07)
Basophils Absolute: 0 10*3/uL (ref 0.0–0.1)
Basophils Relative: 1 %
Eosinophils Absolute: 0.2 10*3/uL (ref 0.0–0.5)
Eosinophils Relative: 3 %
HCT: 38.7 % (ref 36.0–46.0)
Hemoglobin: 13 g/dL (ref 12.0–15.0)
Immature Granulocytes: 0 %
Lymphocytes Relative: 37 %
Lymphs Abs: 2 10*3/uL (ref 0.7–4.0)
MCH: 31.9 pg (ref 26.0–34.0)
MCHC: 33.6 g/dL (ref 30.0–36.0)
MCV: 95.1 fL (ref 80.0–100.0)
Monocytes Absolute: 0.3 10*3/uL (ref 0.1–1.0)
Monocytes Relative: 6 %
Neutro Abs: 2.8 10*3/uL (ref 1.7–7.7)
Neutrophils Relative %: 53 %
Platelet Count: 284 10*3/uL (ref 150–400)
RBC: 4.07 MIL/uL (ref 3.87–5.11)
RDW: 13.7 % (ref 11.5–15.5)
WBC Count: 5.3 10*3/uL (ref 4.0–10.5)
nRBC: 0 % (ref 0.0–0.2)

## 2021-05-07 LAB — CMP (CANCER CENTER ONLY)
ALT: 16 U/L (ref 0–44)
AST: 19 U/L (ref 15–41)
Albumin: 4.1 g/dL (ref 3.5–5.0)
Alkaline Phosphatase: 62 U/L (ref 38–126)
Anion gap: 8 (ref 5–15)
BUN: 33 mg/dL — ABNORMAL HIGH (ref 8–23)
CO2: 26 mmol/L (ref 22–32)
Calcium: 9.4 mg/dL (ref 8.9–10.3)
Chloride: 102 mmol/L (ref 98–111)
Creatinine: 1.17 mg/dL — ABNORMAL HIGH (ref 0.44–1.00)
GFR, Estimated: 52 mL/min — ABNORMAL LOW (ref >60)
Glucose, Bld: 88 mg/dL (ref 70–99)
Potassium: 4.5 mmol/L (ref 3.5–5.1)
Sodium: 136 mmol/L (ref 135–145)
Total Bilirubin: 0.5 mg/dL (ref 0.3–1.2)
Total Protein: 7 g/dL (ref 6.5–8.1)

## 2021-05-07 MED ORDER — AMOXICILLIN 500 MG PO TABS: 500.0000 mg | ORAL_TABLET | Freq: Two times a day (BID) | ORAL | 0 refills | Status: DC

## 2021-05-07 NOTE — Telephone Encounter (Signed)
Called her back. Scheduled lab appt at 1130, lab appt 1/20 canceled. She is aware of appt time.

## 2021-05-07 NOTE — Progress Notes (Signed)
Chief Complaint:   OBESITY Jaislyn is here to discuss her progress with her obesity treatment plan along with follow-up of her obesity related diagnoses. Adore is on keeping a food journal and adhering to recommended goals of 1300-1400 calories and 90+ grams of protein daily and states she is following her eating plan approximately 60% of the time. Rily states she is walking for 60 minutes 1 time per week.  Today's visit was #: 28 Starting weight: 209 lbs Starting date: 04/29/2018 Today's weight: 192 lbs Today's date: 05/03/2021 Total lbs lost to date: 17 Total lbs lost since last in-office visit: 0  Interim History: Mateya is glad that she only gained 1 lb over the holidays. She increased her activity and even went to the gym at work to walk on the treadmill. She feels good exercising and she wishes to increase her frequency and time she exercises. She journaled over 3/4 of the time since her last office visit, and she feels that really helps her be accountable.  Subjective:   1. Insulin resistance Blondina denies issues with carbohydrate cravings or hunger. There were several days that she was under in calories and protein.  2. Depressed mood with emotional eating Mayrene H Nowack is tolerating medication(s) well without side effects. Her symptoms are well controlled especially with exercising lately. Medication compliance is good and patient appears to be taking it as prescribed.  Denies additional concerns regarding this condition.   3. At risk for activity intolerance Lexis is at risk for exercise intolerance due to increasing her time she exercises and increasing the days she exercise per week.  Assessment/Plan:  No orders of the defined types were placed in this encounter.   Medications Discontinued During This Encounter  Medication Reason   lidocaine-prilocaine (EMLA) cream    omeprazole (PRILOSEC) 20 MG capsule    buPROPion (WELLBUTRIN SR) 200 MG 12 hr tablet Reorder    metFORMIN (GLUCOPHAGE) 500 MG tablet Reorder     Meds ordered this encounter  Medications   buPROPion (WELLBUTRIN SR) 200 MG 12 hr tablet    Sig: Take 1 tablet by mouth 2 times daily. (need office visit for refills)    Dispense:  60 tablet    Refill:  0    30 d supply; ov for RF   metFORMIN (GLUCOPHAGE) 500 MG tablet    Sig: Take 1 tablet by mouth every morning and 2 tablets every evening as directed (need office visit for refills)    Dispense:  90 tablet    Refill:  0    30 d supply; ov for RF     1. Insulin resistance We will refill metformin for 1 month. Maley will try to meet her protein and calorie goals. Counseling was done again on the importance of protein in controlling hunger/cravings and speed up her metabolism.  - metFORMIN (GLUCOPHAGE) 500 MG tablet; Take 1 tablet by mouth every morning and 2 tablets every evening as directed (need office visit for refills)  Dispense: 90 tablet; Refill: 0  2. Depressed mood with emotional eating Behavior modification techniques were discussed today to help Daiya deal with her emotional/non-hunger eating behaviors.  Orders and follow up as documented in patient record.   - buPROPion (WELLBUTRIN SR) 200 MG 12 hr tablet; Take 1 tablet by mouth 2 times daily. (need office visit for refills)  Dispense: 60 tablet; Refill: 0  3. At risk for activity intolerance Sharmila was given approximately 9 minutes of exercise intolerance counseling  today. She is 66 y.o. female and has risk factors exercise intolerance including obesity. We discussed intensive lifestyle modifications today with an emphasis on specific weight loss instructions and strategies. Cinzia will slowly increase activity as tolerated.  Repetitive spaced learning was employed today to elicit superior memory formation and behavioral change.  4. Obesity with current BMI of 31.1 Oksana is currently in the action stage of change. As such, her goal is to continue with weight loss efforts. She  has agreed to keeping a food journal and adhering to recommended goals of 1300-1400 calories and 90+ grams of protein daily.   Exercise goals: Exercise for 30 minutes 3-4 times per week.  Behavioral modification strategies: increasing lean protein intake, decreasing simple carbohydrates, planning for success, and keeping a strict food journal.  Maudean has agreed to follow-up with our clinic in 2 to 3 weeks. She was informed of the importance of frequent follow-up visits to maximize her success with intensive lifestyle modifications for her multiple health conditions.   Objective:   Blood pressure 126/73, pulse 64, temperature (!) 97.4 F (36.3 C), height 5\' 6"  (1.676 m), weight 192 lb (87.1 kg), SpO2 98 %. Body mass index is 30.99 kg/m.  General: Cooperative, alert, well developed, in no acute distress. HEENT: Conjunctivae and lids unremarkable. Cardiovascular: Regular rhythm.  Lungs: Normal work of breathing. Neurologic: No focal deficits.   Lab Results  Component Value Date   CREATININE 1.17 (H) 05/07/2021   BUN 33 (H) 05/07/2021   NA 136 05/07/2021   K 4.5 05/07/2021   CL 102 05/07/2021   CO2 26 05/07/2021   Lab Results  Component Value Date   ALT 16 05/07/2021   AST 19 05/07/2021   ALKPHOS 62 05/07/2021   BILITOT 0.5 05/07/2021   Lab Results  Component Value Date   HGBA1C 5.2 11/30/2019   HGBA1C 5.3 05/31/2019   HGBA1C 5.2 11/19/2018   HGBA1C 5.5 04/29/2018   Lab Results  Component Value Date   INSULIN 23.8 11/30/2019   INSULIN 16.5 05/31/2019   INSULIN 17.6 11/19/2018   INSULIN 25.8 (H) 04/29/2018   Lab Results  Component Value Date   TSH 1.150 02/23/2021   Lab Results  Component Value Date   CHOL 224 (H) 11/30/2019   HDL 52 11/30/2019   LDLCALC 136 (H) 11/30/2019   TRIG 201 (H) 11/30/2019   Lab Results  Component Value Date   VD25OH 59.0 11/30/2019   VD25OH 54.0 05/31/2019   VD25OH 42.8 11/19/2018   Lab Results  Component Value Date   WBC 5.3  05/07/2021   HGB 13.0 05/07/2021   HCT 38.7 05/07/2021   MCV 95.1 05/07/2021   PLT 284 05/07/2021   No results found for: IRON, TIBC, FERRITIN  Attestation Statements:   Reviewed by clinician on day of visit: allergies, medications, problem list, medical history, surgical history, family history, social history, and previous encounter notes.   Wilhemena Durie, am acting as transcriptionist for [employer], DO.  I have reviewed the above documentation for accuracy and completeness, and I agree with the above. Marjory Sneddon, D.O.  The [place] was signed into law in 2016 which includes the topic of electronic health records.  This provides immediate access to information in MyChart.  This includes consultation notes, operative notes, office notes, lab results and pathology reports.  If you have any questions about what you read please let us know at your next visit so we can discuss your  concerns and take corrective action if need be.  We are right here with you.

## 2021-05-07 NOTE — Telephone Encounter (Signed)
Returned her call and left a message to call the office back. She is having burning with urination and feels like she is has a UTI. She is having some nausea also. Requesting lab appt.

## 2021-05-07 NOTE — Telephone Encounter (Signed)
Left a message. Dr. Alvy Bimler sent Amoxicillin Rx to her pharmacy. Ask her to call the office back tomorrow to give update.

## 2021-05-08 ENCOUNTER — Telehealth: Payer: Self-pay

## 2021-05-08 NOTE — Telephone Encounter (Signed)
Called and left a message asking her to call the office back. Per Dr. Alvy Bimler, her urine culture is suspicious but not definitive for UTI. She suggest she completes oral antibiotics.

## 2021-05-08 NOTE — Telephone Encounter (Signed)
She called back and left a message. She will continue the antibiotic. She was up during the night with burning and pain with urination.  She is feeling better today and having a much easier time urinating. She is taking AZO OTC along with the antibiotic.

## 2021-05-09 ENCOUNTER — Other Ambulatory Visit: Payer: Self-pay | Admitting: Hematology and Oncology

## 2021-05-09 ENCOUNTER — Other Ambulatory Visit (HOSPITAL_COMMUNITY): Payer: Self-pay

## 2021-05-09 DIAGNOSIS — N3000 Acute cystitis without hematuria: Secondary | ICD-10-CM

## 2021-05-09 LAB — URINE CULTURE: Culture: 60000 — AB

## 2021-05-09 MED ORDER — CEFDINIR 300 MG PO CAPS
300.0000 mg | ORAL_CAPSULE | Freq: Two times a day (BID) | ORAL | 0 refills | Status: DC
  Filled 2021-05-09: qty 6, 3d supply, fill #0

## 2021-05-11 ENCOUNTER — Inpatient Hospital Stay (HOSPITAL_BASED_OUTPATIENT_CLINIC_OR_DEPARTMENT_OTHER): Payer: 59 | Admitting: Hematology and Oncology

## 2021-05-11 ENCOUNTER — Inpatient Hospital Stay: Payer: 59

## 2021-05-11 ENCOUNTER — Encounter: Payer: Self-pay | Admitting: Hematology and Oncology

## 2021-05-11 ENCOUNTER — Other Ambulatory Visit: Payer: Self-pay

## 2021-05-11 VITALS — BP 140/52 | HR 74 | Temp 97.4°F | Resp 18 | Ht 66.0 in | Wt 192.4 lb

## 2021-05-11 DIAGNOSIS — C569 Malignant neoplasm of unspecified ovary: Secondary | ICD-10-CM

## 2021-05-11 DIAGNOSIS — Z881 Allergy status to other antibiotic agents status: Secondary | ICD-10-CM

## 2021-05-11 DIAGNOSIS — N3000 Acute cystitis without hematuria: Secondary | ICD-10-CM

## 2021-05-11 DIAGNOSIS — Z5111 Encounter for antineoplastic chemotherapy: Secondary | ICD-10-CM | POA: Diagnosis not present

## 2021-05-11 DIAGNOSIS — N390 Urinary tract infection, site not specified: Secondary | ICD-10-CM | POA: Diagnosis not present

## 2021-05-11 DIAGNOSIS — Z7189 Other specified counseling: Secondary | ICD-10-CM

## 2021-05-11 DIAGNOSIS — N183 Chronic kidney disease, stage 3 unspecified: Secondary | ICD-10-CM | POA: Diagnosis not present

## 2021-05-11 DIAGNOSIS — C786 Secondary malignant neoplasm of retroperitoneum and peritoneum: Secondary | ICD-10-CM | POA: Diagnosis not present

## 2021-05-11 MED ORDER — DIPHENHYDRAMINE HCL 50 MG/ML IJ SOLN
25.0000 mg | Freq: Once | INTRAMUSCULAR | Status: AC
  Administered 2021-05-11: 25 mg via INTRAVENOUS
  Filled 2021-05-11: qty 1

## 2021-05-11 MED ORDER — SODIUM CHLORIDE 0.9% FLUSH
10.0000 mL | INTRAVENOUS | Status: DC | PRN
  Administered 2021-05-11: 10 mL

## 2021-05-11 MED ORDER — SODIUM CHLORIDE 0.9 % IV SOLN: Freq: Once | INTRAVENOUS | Status: AC

## 2021-05-11 MED ORDER — FAMOTIDINE 20 MG IN NS 100 ML IVPB
20.0000 mg | Freq: Once | INTRAVENOUS | Status: AC
  Administered 2021-05-11: 20 mg via INTRAVENOUS
  Filled 2021-05-11: qty 100

## 2021-05-11 MED ORDER — DEXAMETHASONE SODIUM PHOSPHATE 10 MG/ML IJ SOLN
5.0000 mg | Freq: Once | INTRAMUSCULAR | Status: AC
  Administered 2021-05-11: 5 mg via INTRAVENOUS
  Filled 2021-05-11: qty 1

## 2021-05-11 MED ORDER — HEPARIN SOD (PORK) LOCK FLUSH 100 UNIT/ML IV SOLN
500.0000 [IU] | Freq: Once | INTRAVENOUS | Status: AC | PRN
  Administered 2021-05-11: 500 [IU]

## 2021-05-11 MED ORDER — SODIUM CHLORIDE 0.9 % IV SOLN
60.0000 mg/m2 | Freq: Once | INTRAVENOUS | Status: AC
  Administered 2021-05-11: 120 mg via INTRAVENOUS
  Filled 2021-05-11: qty 20

## 2021-05-11 MED ORDER — SODIUM CHLORIDE 0.9% FLUSH
10.0000 mL | Freq: Once | INTRAVENOUS | Status: AC
  Administered 2021-05-11: 10 mL

## 2021-05-11 NOTE — Patient Instructions (Signed)
[facility]   Discharge Instructions: Thank you for choosing [facility] to provide your oncology and hematology care.   If you have a lab appointment with the Cancer Center, please go directly to the Cancer Center and check in at the registration area.   Wear comfortable clothing and clothing appropriate for easy access to any Portacath or PICC line.   We strive to give you quality time with your provider. You may need to reschedule your appointment if you arrive late (15 or more minutes).  Arriving late affects you and other patients whose appointments are after yours.  Also, if you miss three or more appointments without notifying the office, you may be dismissed from the clinic at the provider's discretion.      For prescription refill requests, have your pharmacy contact our office and allow 72 hours for refills to be completed.    Today you received the following chemotherapy and/or immunotherapy agents: paclitaxel.      To help prevent nausea and vomiting after your treatment, we encourage you to take your nausea medication as directed.  BELOW ARE SYMPTOMS THAT SHOULD BE REPORTED IMMEDIATELY: *FEVER GREATER THAN 100.4 F (38 C) OR HIGHER *CHILLS OR SWEATING *NAUSEA AND VOMITING THAT IS NOT CONTROLLED WITH YOUR NAUSEA MEDICATION *UNUSUAL SHORTNESS OF BREATH *UNUSUAL BRUISING OR BLEEDING *URINARY PROBLEMS (pain or burning when urinating, or frequent urination) *BOWEL PROBLEMS (unusual diarrhea, constipation, pain near the anus) TENDERNESS IN MOUTH AND THROAT WITH OR WITHOUT PRESENCE OF ULCERS (sore throat, sores in mouth, or a toothache) UNUSUAL RASH, SWELLING OR PAIN  UNUSUAL VAGINAL DISCHARGE OR ITCHING   Items with * indicate a potential emergency and should be followed up as soon as possible or go to the Emergency Department if any problems should occur.  Please show the CHEMOTHERAPY ALERT CARD or IMMUNOTHERAPY ALERT CARD at check-in  to the Emergency Department and triage nurse.  Should you have questions after your visit or need to cancel or reschedule your appointment, please contact [facility]  Dept: [phone]  and follow the prompts.  Office hours are 8:00 a.m. to 4:30 p.m. Monday - Friday. Please note that voicemails left after 4:00 p.m. may not be returned until the following business day.  We are closed weekends and major holidays. You have access to a nurse at all times for urgent questions. Please call the main number to the clinic Dept: [phone] and follow the prompts.   For any non-urgent questions, you may also contact your provider using MyChart. We now offer e-Visits for anyone 18 and older to request care online for non-urgent symptoms. For details visit [email or web address].   Also download the MyChart app! Go to the app store, search "MyChart", open the app, select , and log in with your MyChart username and password.  Due to Covid, a mask is required upon entering the hospital/clinic. If you do not have a mask, one will be given to you upon arrival. For doctor visits, patients may have 1 support person aged 18 or older with them. For treatment visits, patients cannot have anyone with them due to current Covid guidelines and our immunocompromised population.   

## 2021-05-11 NOTE — Assessment & Plan Note (Signed)
So far, she tolerated treatment fairly well except for recent urinary tract infection Her neuropathy is stable She received a few more doses of treatment with plan to repeat imaging study next month for further follow-up I will schedule her treatment on February 3 The patient stated she needs to help to take care of her daughter early next month She is aware she need repeat imaging study next month and will call us with her schedule

## 2021-05-11 NOTE — Assessment & Plan Note (Signed)
We will consult allergist/immunologist or infectious disease for recommendation in the future

## 2021-05-11 NOTE — Assessment & Plan Note (Signed)
Renal function is stable  We discussed importance of oral fluid intake

## 2021-05-11 NOTE — Assessment & Plan Note (Signed)
She had drug resistance noted on recent urine culture The patient had multiple different allergies, nonspecific I will reach out to allergy/immunology or infectious disease for recommendation if she becomes resistant to cephalosporin in the future

## 2021-05-11 NOTE — Progress Notes (Signed)
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[place]) So far, she tolerated treatment fairly well except for recent urinary tract infection Her neuropathy is stable She received a few more doses of treatment with plan to repeat imaging study next month for further follow-up I will schedule her treatment on February 3 The patient stated she needs to help to take care of her daughter early next month She is aware she need repeat imaging study next month and will call us with her schedule  Urinary tract infection She had drug resistance noted on recent urine culture The patient had multiple different allergies, nonspecific I will reach out to allergy/immunology or infectious disease for recommendation if she becomes resistant to cephalosporin in the future  Allergy to multiple antibiotics We will consult allergist/immunologist or infectious disease for recommendation in the future  CKD (chronic kidney disease), stage III Renal function is stable  We discussed importance of oral fluid intake  Orders Placed This Encounter  Procedures   CT ABDOMEN PELVIS W CONTRAST    Standing Status:   Future    Standing Expiration Date:   05/11/2022    Order Specific Question:   If indicated for the ordered procedure, I authorize the administration of contrast media per Radiology protocol    Answer:   Yes    Order Specific Question:   Preferred imaging location?    Answer:   [facility]    Order Specific Question:   Radiology Contrast Protocol - do NOT remove file path    Answer:   [email or web address]    All questions were answered. The patient knows to call the clinic with any problems, questions or concerns. The total time spent in the appointment was 30 minutes encounter with patients including review of chart and  various tests results, discussions about plan of care and coordination of care plan   Heath Lark, MD 05/11/2021 9:34 AM  INTERVAL HISTORY: Please see below for problem oriented charting. she returns for treatment follow-up on single agent Taxol for granulosa cell tumor of the ovary She continues to have symptoms of dysuria No recent fever or chills Denies recent nausea No worsening peripheral neuropathy on treatment  REVIEW OF SYSTEMS:   Constitutional: Denies fevers, chills or abnormal weight loss Eyes: Denies blurriness of vision Ears, nose, mouth, throat, and face: Denies mucositis or sore throat Respiratory: Denies cough, dyspnea or wheezes Cardiovascular: Denies palpitation, chest discomfort or lower extremity swelling Gastrointestinal:  Denies nausea, heartburn or change in bowel habits Skin: Denies abnormal skin rashes Lymphatics: Denies new lymphadenopathy or easy bruising Behavioral/Psych: Mood is stable, no new changes  All other systems were reviewed with the patient and are negative.  I have reviewed the past medical history, past surgical history, social history and family history with the patient and they are unchanged from previous note.  ALLERGIES:  is allergic to codeine, hydrocodone-acetaminophen, oxycodone, thimerosal, tramadol hcl, ciprofloxacin, daypro [oxaprozin], levofloxacin, stadol [butorphanol tartrate], and sulfa antibiotics.  MEDICATIONS:  Current Outpatient Medications  Medication Sig Dispense Refill   acetaminophen (TYLENOL) 500 MG tablet Take 1,000 mg by mouth every 6 (six) hours as needed for moderate pain.     Biotin 5 MG TABS Take 5 mg by mouth daily.     buPROPion (WELLBUTRIN SR) 200 MG 12 hr tablet Take 1 tablet by mouth 2 times daily. (need office  visit for refills) 60 tablet 0   Ca Phosphate-Cholecalciferol (EQL CALCIUM GUMMIES) 250-400 MG-UNIT CHEW Chew 1 tablet by mouth 2 (two) times daily.     cefdinir (OMNICEF) 300 MG capsule Take 1 capsule  (300 mg total) by mouth 2 (two) times daily. 6 capsule 0   escitalopram (LEXAPRO) 20 MG tablet TAKE 1 TABLET BY MOUTH ONCE A DAY (Patient taking differently: Take 20 mg by mouth daily. Taking 10 mg daily, temporarily due to LFT'S.) 90 tablet 3   gabapentin (NEURONTIN) 300 MG capsule Take 1 capsule (300 mg total) by mouth 2 (two) times daily. 60 capsule 3   lidocaine-prilocaine (EMLA) cream Apply topically as needed. 30 g 3   LORazepam (ATIVAN) 0.5 MG tablet Take 1 tablet (0.5 mg total) by mouth 2 (two) times daily as needed for anxiety. 30 tablet 0   metFORMIN (GLUCOPHAGE) 500 MG tablet Take 1 tablet by mouth every morning and 2 tablets every evening as directed (need office visit for refills) 90 tablet 0   mirabegron ER (MYRBETRIQ) 50 MG TB24 tablet Take 1 tablet by mouth once daily 90 tablet 3   mupirocin ointment (BACTROBAN) 2 % Apply to the inside of each nostril twice daily for five (5) days. After application, press sides of nose together and gently massage. 22 g 1   omeprazole (PRILOSEC) 20 MG capsule Take 1 capsule by mouth once a day 90 capsule 3   ondansetron (ZOFRAN) 8 MG tablet Take 1 tablet (8 mg total) by mouth every 8 (eight) hours as needed for nausea or vomiting. 60 tablet 0   Polyethyl Glycol-Propyl Glycol (SYSTANE OP) Place 1 drop into both eyes daily as needed (dry eyes).     Prenatal MV & Min w/FA-DHA (PRENATAL GUMMIES) 0.18-25 MG CHEW      Probiotic Product (ALIGN PO) Take 1 tablet by mouth daily.      prochlorperazine (COMPAZINE) 10 MG tablet Take 1 tablet by mouth every 6 hours as needed for nausea or vomiting. 30 tablet 1   No current facility-administered medications for this visit.    SUMMARY OF ONCOLOGIC HISTORY: Oncology History Overview Note  2018 - Core biopsy for ER/PR and Foundation One testing performed. Unfortunately, no sufficient tissue for Foundation One testing. ER was 50% and PR 90%. 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     - 09/2000 Chemotherapy   6 cycles of IP cisplatin and etoposide    02/2009 Relapse/Recurrence   CT showed increased size of nodules in pelvis   2010 Surgery   Exlap with section of tumor nodules near cecum and left pelvic sidewall. Tumor: ER negative, PR positive    Treatment Plan Change   Alternated 2 week courses of Megace and Tamoxifen - ended 03/2012   03/2012 PET scan   CT - progressive disease   2013 Treatment Plan Change   Letrozole   01/2016 Imaging   MRI showed progressive disease   2017 Treatment Plan Change  Two weeks of alternating Tamoxifen 20mg  daily and then Megace 40mg  TID   08/2016 Imaging   MRI showed progressive disease with peritoneal implants near liver, in pelvis   01/2017 Treatment Plan Change   Lupron 11.25 q 3 months   11/2017 Imaging   Overall mixed response.   Mixed cystic/solid lesions in the left pelvis are mildly improved.   Cystic peritoneal disease, including the dominant lesion along the posterior right hepatic lobe, is mildly progressed.   Subcapsular lesion along the posterior right hepatic lobe is unchanged.   03/2018 Imaging   MRI A/P: Mixed response of individual peritoneal metastases in the pelvis, as described above. Overall, there has been no significant change in bulk of disease.   Stable cystic peritoneal metastatic  disease along the capsular surfaces of the liver and spleen.   No new sites of metastatic disease identified within the abdomen or pelvis.   08/2018 Imaging   MRI A/P: Status post hysterectomy and bilateral salpingo-oophorectomy.   Mixed cystic/solid peritoneal implants in the abdomen/pelvis, as above. Dominant cystic implant along the posterior liver surface is mildly increased. Remaining lesions are overall grossly unchanged.   No new lesions are identified.   01/28/2019 Imaging   Mri A/P: 1. Relatively similar appearance of peritoneal metastasis. A posterior right hepatic capsular based lesion is similar to minimally decreased in size. Left pelvic implants are primarily similar with possible enlargement of an anterior high left pelvic cystic implant. No new disease identified. 2.  Aortic Atherosclerosis (ICD10-I70.0). 3. Hepatic steatosis. 4. Left adrenal adenoma.   06/02/2019 Imaging   MRI 1. Potential slight enlargement of dominant cystic area and solid component, associated with rind like signal variation on T2 along the inferior right hepatic margin, also potentially slightly increased. Findings may still be within the realm of measurement and technical variation. Close attention on follow-up. 2. Subtle cystic changes along the cephalad margin of the spleen are difficult to see on previous imaging, perhaps new compared with prior imaging studies. 3. Pelvic implants and left lower quadrant lesion with similar size, of the area along the left iliac vasculature may be slightly larger than on the prior study. 4. Signs of extensive retroperitoneal and pelvic lymphadenectomy. 5. Hepatic steatosis. 6. 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     06/08/2019 Cancer Staging   Staging form: [place], AJCC 7th Edition - Clinical: Stage IIIC (rT2, N1, M0) - Signed by Heath Lark, MD on 06/08/2019    06/10/2019 Imaging   1. Multiple redemonstrated  partially solid metastatic implants in the hepatorenal recess, left paracolic gutter, left pelvis, and likely the tip of the spleen as detailed above and as seen on recent prior MRI dated 06/02/2019. These findings are slightly worsened in comparison to a remote prior CT examination dated 10/04/2014.   2.  No evidence of metastatic disease in the chest.   3. Status post hysterectomy, pelvic and retroperitoneal lymph node dissection, and ventral hernia mesh repair.   4.  Hepatic steatosis.   5.  Aortic Atherosclerosis (ICD10-I70.0).   06/24/2019 - 09/02/2019 Chemotherapy   The patient had carboplatin for chemotherapy treatment.     09/02/2019 Tumor Marker   Patient's tumor was tested for the following markers: Inhibin B Results of the tumor marker test revealed 94   09/23/2019 Imaging   1. Interval progression of the soft tissue lesions in the anterior left pelvis, likely peritoneal implants, compatible with disease progression. Remaining sites of apparent  disease along the liver capsule and posterior spleen are stable 2. Stable 2 cm left adrenal adenoma. 3. Hepatic steatosis. 4. Right-side predominant colonic diverticulosis without diverticulitis. 5. Aortic Atherosclerosis (ICD10-I70.0).   12/23/2019 Imaging   1. Slight interval increase in size of a mixed solid and cystic nodule in the left hemipelvis measuring 3.0 x 2.9 cm, previously 2.7 x 2.1 cm. 2. Interval decrease in size of a nodule in the left paracolic gutter measuring 1.0 x 0.8 cm, previously 2.1 x 1.6 cm. 3. Stable peritoneal nodules in the hepatorenal recess and at the inferior tip of the spleen. 4. Unchanged nodule or lymph node overlying the left external iliac artery. 5. Enlargement of dominant left pelvic nodule is concerning for disease progression despite interval decrease in size of a nodule in the left paracolic gutter and stability of other nodules. 6. No evidence of metastatic disease in the chest. 7. Stable, benign  left adrenal adenoma. 8. Ventral hernia mesh repair with a small component of recurrent hernia inferiorly, containing a single nonobstructed loop of small bowel. 9. Hepatic steatosis. 10. Aortic Atherosclerosis (ICD10-I70.0).   12/23/2019 Tumor Marker   Patient's tumor was tested for the following markers: Inhibin B Results of the tumor marker test revealed 94.9   01/25/2020 Tumor Marker   Patient's tumor was tested for the following markers: Inhibin B Results of the tumor marker test revealed 116.7   02/20/2020 Genetic Testing   Negative genetic testing: no pathogenic variants detected in [facility] Multi-Cancer Panel.  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  The report date is February 20, 2020.   The Multi-Cancer Panel offered by [facility] includes sequencing and/or deletion duplication testing of the following 85 genes: AIP, ALK, APC, ATM, AXIN2,BAP1,  BARD1, BLM, BMPR1A, BRCA1, BRCA2, BRIP1, CASR, CDC73, CDH1, CDK4, CDKN1B, CDKN1C, CDKN2A (p14ARF), CDKN2A (p16INK4a), CEBPA, CHEK2, CTNNA1, DICER1, DIS3L2, EGFR (c.2369C>T, p.Thr790Met variant only), EPCAM (Deletion/duplication testing only), FH, FLCN, GATA2, GPC3, GREM1 (Promoter region deletion/duplication testing only), HOXB13 (c.251G>A, p.Gly84Glu), HRAS, KIT, MAX, MEN1, MET, MITF (c.952G>A, p.Glu318Lys variant only), MLH1, MSH2, MSH3, MSH6, MUTYH, NBN, NF1, NF2, NTHL1, PALB2, PDGFRA, PHOX2B, PMS2, POLD1, POLE, POT1, PRKAR1A, PTCH1, PTEN, RAD50, RAD51C, RAD51D, RB1, RECQL4, RET, RNF43, RUNX1, SDHAF2, SDHA (sequence changes only), SDHB, SDHC, SDHD, SMAD4, SMARCA4, SMARCB1, SMARCE1, STK11, SUFU, TERC, TERT, TMEM127, TP53, TSC1, TSC2, VHL, WRN and WT1.    03/02/2020 Tumor Marker   Patient's tumor was tested for the following markers: Inhibin B Results of the tumor marker test revealed 80.8   04/17/2020 Imaging   1. Overall, exam is stable. Multiple peritoneal nodules are again seen. The index nodule in the left lower  quadrant is mildly increased in size in the interval. The lesion within the anterior left pelvis has decreased in size in the interval. There has also been decrease in size of cystic lesion within the a hepatorenal recess. The remaining peritoneal lesions are unchanged. No new lesions identified. 2. Stable left adrenal nodule. 3.  Aortic Atherosclerosis (ICD10-I70.0).     08/21/2020 Imaging   Bone density is normal AP spine T score 0.8 Femoral neck on the left, T score -0.2 Femoral neck on the right ,T score -0.4   10/17/2020 Imaging   1. Interval progression of peritoneal nodules along the left pelvic sidewall, concerning for progressive metastatic disease. 2. Small capsular lesion medial right liver and the apparent cystic lesion superior to the right kidney are similar to prior. 3. Tiny soft tissue nodule anterior aspect of the lateral left  pelvis described as decreasing on the prior study has decreased further on today's exam. 4. Stable left adrenal nodule. This cannot be definitively characterized. 5. Tiny nonobstructing stone lower pole right kidney. 6. Aortic Atherosclerosis (ICD10-I70.0).   10/26/2020 Procedure   Successful placement of a right internal jugular approach power injectable Port-A-Cath. The catheter is ready for immediate use.       10/31/2020 -  Chemotherapy   Patient is on Treatment Plan : OVARIAN Paclitaxel K4,4,01,02 q28d     01/25/2021 Imaging   1. Signs of peritoneal disease with scattered peritoneal nodules. The index lesions are either stable or decreased in size in the interval as detailed above. No new sites of disease. 2. Ventral pelvic wall hernia contains a nonobstructed loop of small bowel. 3. Aortic Atherosclerosis (ICD10-I70.0).       PHYSICAL EXAMINATION: ECOG PERFORMANCE STATUS: 1 - Symptomatic but completely ambulatory  Vitals:   05/11/21 0821  BP: (!) 140/52  Pulse: 74  Resp: 18  Temp: (!) 97.4 F (36.3 C)  SpO2: 100%   Filed Weights    05/11/21 0821  Weight: 192 lb 6.4 oz (87.3 kg)    GENERAL:alert, no distress and comfortable NEURO: alert & oriented x 3 with fluent speech, no focal motor/sensory deficits  LABORATORY DATA:  I have reviewed the data as listed    Component Value Date/Time   NA 136 05/07/2021 1304   NA 139 11/30/2019 0913   NA 141 09/24/2013 1131   K 4.5 05/07/2021 1304   K 4.5 09/24/2013 1131   CL 102 05/07/2021 1304   CO2 26 05/07/2021 1304   CO2 24 09/24/2013 1131   GLUCOSE 88 05/07/2021 1304   GLUCOSE 90 09/24/2013 1131   BUN 33 (H) 05/07/2021 1304   BUN 32 (H) 11/30/2019 0913   BUN 21.2 09/24/2013 1131   CREATININE 1.17 (H) 05/07/2021 1304   CREATININE 1.1 09/24/2013 1131   CALCIUM 9.4 05/07/2021 1304   CALCIUM 9.5 09/24/2013 1131   PROT 7.0 05/07/2021 1304   PROT 7.0 11/30/2019 0913   PROT 6.8 09/24/2013 1131   ALBUMIN 4.1 05/07/2021 1304   ALBUMIN 4.3 11/30/2019 0913   ALBUMIN 3.9 09/24/2013 1131   AST 19 05/07/2021 1304   AST 23 09/24/2013 1131   ALT 16 05/07/2021 1304   ALT 27 09/24/2013 1131   ALKPHOS 62 05/07/2021 1304   ALKPHOS 81 09/24/2013 1131   BILITOT 0.5 05/07/2021 1304   BILITOT 0.53 09/24/2013 1131   GFRNONAA 52 (L) 05/07/2021 1304   GFRAA 55 (L) 01/24/2020 1530   GFRAA 57 (L) 12/21/2019 1259    No results found for: SPEP, UPEP  Lab Results  Component Value Date   WBC 5.3 05/07/2021   NEUTROABS 2.8 05/07/2021   HGB 13.0 05/07/2021   HCT 38.7 05/07/2021   MCV 95.1 05/07/2021   PLT 284 05/07/2021      Chemistry      Component Value Date/Time   NA 136 05/07/2021 1304   NA 139 11/30/2019 0913   NA 141 09/24/2013 1131   K 4.5 05/07/2021 1304   K 4.5 09/24/2013 1131   CL 102 05/07/2021 1304   CO2 26 05/07/2021 1304   CO2 24 09/24/2013 1131   BUN 33 (H) 05/07/2021 1304   BUN 32 (H) 11/30/2019 0913   BUN 21.2 09/24/2013 1131   CREATININE 1.17 (H) 05/07/2021 1304   CREATININE 1.1 09/24/2013 1131      Component Value Date/Time   CALCIUM 9.4  05/07/2021 1304  CALCIUM 9.5 09/24/2013 1131   ALKPHOS 62 05/07/2021 1304   ALKPHOS 81 09/24/2013 1131   AST 19 05/07/2021 1304   AST 23 09/24/2013 1131   ALT 16 05/07/2021 1304   ALT 27 09/24/2013 1131   BILITOT 0.5 05/07/2021 1304   BILITOT 0.53 09/24/2013 1131

## 2021-05-14 ENCOUNTER — Other Ambulatory Visit: Payer: Self-pay

## 2021-05-14 DIAGNOSIS — Z881 Allergy status to other antibiotic agents status: Secondary | ICD-10-CM

## 2021-05-14 DIAGNOSIS — C569 Malignant neoplasm of unspecified ovary: Secondary | ICD-10-CM

## 2021-05-18 ENCOUNTER — Telehealth: Payer: Self-pay

## 2021-05-18 NOTE — Telephone Encounter (Signed)
Called to check on referral to Dr. Neldon Mc. They attempted to schedule and left Meganne a message.

## 2021-05-21 ENCOUNTER — Encounter (INDEPENDENT_AMBULATORY_CARE_PROVIDER_SITE_OTHER): Payer: Self-pay | Admitting: Family Medicine

## 2021-05-22 ENCOUNTER — Ambulatory Visit (INDEPENDENT_AMBULATORY_CARE_PROVIDER_SITE_OTHER): Payer: 59 | Admitting: Family Medicine

## 2021-05-22 ENCOUNTER — Other Ambulatory Visit: Payer: Self-pay

## 2021-05-22 ENCOUNTER — Encounter (INDEPENDENT_AMBULATORY_CARE_PROVIDER_SITE_OTHER): Payer: Self-pay | Admitting: Family Medicine

## 2021-05-22 ENCOUNTER — Other Ambulatory Visit (HOSPITAL_COMMUNITY): Payer: Self-pay

## 2021-05-22 VITALS — BP 110/67 | HR 63 | Temp 98.1°F | Ht 66.0 in | Wt 190.0 lb

## 2021-05-22 DIAGNOSIS — E8881 Metabolic syndrome: Secondary | ICD-10-CM | POA: Diagnosis not present

## 2021-05-22 DIAGNOSIS — Z6830 Body mass index (BMI) 30.0-30.9, adult: Secondary | ICD-10-CM | POA: Diagnosis not present

## 2021-05-22 DIAGNOSIS — F3289 Other specified depressive episodes: Secondary | ICD-10-CM

## 2021-05-22 DIAGNOSIS — Z9189 Other specified personal risk factors, not elsewhere classified: Secondary | ICD-10-CM

## 2021-05-22 DIAGNOSIS — E669 Obesity, unspecified: Secondary | ICD-10-CM

## 2021-05-22 MED ORDER — METFORMIN HCL 500 MG PO TABS
ORAL_TABLET | ORAL | 0 refills | Status: DC
  Filled 2021-05-22: qty 90, fill #0

## 2021-05-22 MED ORDER — BUPROPION HCL ER (SR) 200 MG PO TB12
200.0000 mg | ORAL_TABLET | Freq: Two times a day (BID) | ORAL | 0 refills | Status: DC
  Filled 2021-05-22: qty 60, 30d supply, fill #0

## 2021-05-23 NOTE — Progress Notes (Signed)
Chief Complaint:   OBESITY [name] is here to discuss her progress with her obesity treatment plan along with follow-up of her obesity related diagnoses. [name] is on keeping a food journal and adhering to recommended goals of 1300-1400 calories and 90+ grams of protein daily and states she is following her eating plan approximately 50% of the time. [name] states she is walking for 30-45 minutes 3 times per week.  Today's visit was #: 77 Starting weight: 209 lbs Starting date: 04/29/2018 Today's weight: 190 lbs Today's date: 05/22/2021 Total lbs lost to date: 19 Total lbs lost since last in-office visit: 2  Interim History: [name] continues to do well with weight loss. She is journaling intermittently, and she is doing well with meeting her calorie goal. She is doing better with protein, but she still has to increase a bit more.   Subjective:   1. Insulin resistance [name] is working on her diet and exercise, with no side effects noted.   2. Other depression, with emotional eating [name] is working on decreasing emotional eating behaviors, but she notes struggling more in the PM. She has a mild intention tremor which may be exacerbated by Wellbutrin. She is ok with continuing Wellbutrin.   3. At risk for impaired metabolic function [name] is at increased risk for impaired metabolic function if protein decreases.  Assessment/Plan:   1. Insulin resistance [name] will continue to work on weight loss, exercise, and decreasing simple carbohydrates to help decrease the risk of diabetes. We will refill metformin for 1 month. [name] agreed to follow-up with [name] as directed to closely monitor her progress.  - metFORMIN (GLUCOPHAGE) 500 MG tablet; Take 1 tablet by mouth every morning and 2 tablets every evening as directed (need office visit for refills)  Dispense: 90 tablet; Refill: 0  2. Other depression, with emotional eating We will refill Wellbutrin SR for 1 month. Emotional eating behaviors  were explained and strategies were discussed today with [name]. Orders and follow up as documented in patient record.   - buPROPion (WELLBUTRIN SR) 200 MG 12 hr tablet; Take 1 tablet by mouth 2 times daily. (need office visit for refills)  Dispense: 60 tablet; Refill: 0  3. At risk for impaired metabolic function [name] was given approximately 15 minutes of impaired  metabolic function prevention counseling today. We discussed intensive lifestyle modifications today with an emphasis on specific nutrition and exercise instructions and strategies.   Repetitive spaced learning was employed today to elicit superior memory formation and behavioral change.  4. Obesity with current BMI of 30.7 [name] is currently in the action stage of change. As such, her goal is to continue with weight loss efforts. She has agreed to keeping a food journal and adhering to recommended goals of 1200 calories and 80+ grams of protein daily.   Exercise goals: As is.  Behavioral modification strategies: increasing lean protein intake and emotional eating strategies.  [name] has agreed to follow-up with our clinic in 4 weeks. She was informed of the importance of frequent follow-up visits to maximize her success with intensive lifestyle modifications for her multiple health conditions.   Objective:   Blood pressure 110/67, pulse 63, temperature 98.1 F (36.7 C), height 5\' 6"  (1.676 m), weight 190 lb (86.2 kg), SpO2 98 %. Body mass index is 30.67 kg/m.  General: Cooperative, alert, well developed, in no acute distress. HEENT: Conjunctivae and lids unremarkable. Cardiovascular: Regular rhythm.  Lungs: Normal work of breathing. Neurologic: No focal deficits.  Lab Results  Component Value Date   CREATININE 1.17 (H) 05/07/2021   BUN 33 (H) 05/07/2021   NA 136 05/07/2021   K 4.5 05/07/2021   CL 102 05/07/2021   CO2 26 05/07/2021   Lab Results  Component Value Date   ALT 16 05/07/2021   AST 19 05/07/2021    ALKPHOS 62 05/07/2021   BILITOT 0.5 05/07/2021   Lab Results  Component Value Date   HGBA1C 5.2 11/30/2019   HGBA1C 5.3 05/31/2019   HGBA1C 5.2 11/19/2018   HGBA1C 5.5 04/29/2018   Lab Results  Component Value Date   INSULIN 23.8 11/30/2019   INSULIN 16.5 05/31/2019   INSULIN 17.6 11/19/2018   INSULIN 25.8 (H) 04/29/2018   Lab Results  Component Value Date   TSH 1.150 02/23/2021   Lab Results  Component Value Date   CHOL 224 (H) 11/30/2019   HDL 52 11/30/2019   LDLCALC 136 (H) 11/30/2019   TRIG 201 (H) 11/30/2019   Lab Results  Component Value Date   VD25OH 59.0 11/30/2019   VD25OH 54.0 05/31/2019   VD25OH 42.8 11/19/2018   Lab Results  Component Value Date   WBC 5.3 05/07/2021   HGB 13.0 05/07/2021   HCT 38.7 05/07/2021   MCV 95.1 05/07/2021   PLT 284 05/07/2021   No results found for: IRON, TIBC, FERRITIN  Attestation Statements:   Reviewed by clinician on day of visit: allergies, medications, problem list, medical history, surgical history, family history, social history, and previous encounter notes.   I, Trixie Dredge, am acting as transcriptionist for Dennard Nip, MD.  I have reviewed the above documentation for accuracy and completeness, and I agree with the above. -  Dennard Nip, MD

## 2021-05-24 ENCOUNTER — Inpatient Hospital Stay: Payer: 59 | Attending: Hematology

## 2021-05-24 ENCOUNTER — Telehealth: Payer: Self-pay

## 2021-05-24 ENCOUNTER — Inpatient Hospital Stay: Payer: 59

## 2021-05-24 ENCOUNTER — Ambulatory Visit (INDEPENDENT_AMBULATORY_CARE_PROVIDER_SITE_OTHER): Payer: 59 | Admitting: Family Medicine

## 2021-05-24 DIAGNOSIS — Z5111 Encounter for antineoplastic chemotherapy: Secondary | ICD-10-CM | POA: Insufficient documentation

## 2021-05-24 DIAGNOSIS — N183 Chronic kidney disease, stage 3 unspecified: Secondary | ICD-10-CM | POA: Insufficient documentation

## 2021-05-24 DIAGNOSIS — C786 Secondary malignant neoplasm of retroperitoneum and peritoneum: Secondary | ICD-10-CM | POA: Insufficient documentation

## 2021-05-24 DIAGNOSIS — C569 Malignant neoplasm of unspecified ovary: Secondary | ICD-10-CM | POA: Insufficient documentation

## 2021-05-24 NOTE — Telephone Encounter (Signed)
She called and is requesting appt tomorrow if possible. She is getting ready to go out of town.  She is complaining of left lower quadrant pain for about 1 month. The area is sore to touch at times. The pain is intermittent. She rates it at a 5, on 1-10 scale with 10 being the worse. She is taking tylenol prn.

## 2021-05-25 ENCOUNTER — Other Ambulatory Visit: Payer: Self-pay

## 2021-05-25 ENCOUNTER — Encounter: Payer: Self-pay | Admitting: Hematology and Oncology

## 2021-05-25 ENCOUNTER — Other Ambulatory Visit (HOSPITAL_COMMUNITY): Payer: Self-pay

## 2021-05-25 ENCOUNTER — Inpatient Hospital Stay (HOSPITAL_BASED_OUTPATIENT_CLINIC_OR_DEPARTMENT_OTHER): Payer: 59 | Admitting: Hematology and Oncology

## 2021-05-25 ENCOUNTER — Inpatient Hospital Stay: Payer: 59

## 2021-05-25 DIAGNOSIS — C569 Malignant neoplasm of unspecified ovary: Secondary | ICD-10-CM

## 2021-05-25 DIAGNOSIS — Z5111 Encounter for antineoplastic chemotherapy: Secondary | ICD-10-CM | POA: Diagnosis not present

## 2021-05-25 DIAGNOSIS — R1010 Upper abdominal pain, unspecified: Secondary | ICD-10-CM | POA: Diagnosis not present

## 2021-05-25 DIAGNOSIS — C786 Secondary malignant neoplasm of retroperitoneum and peritoneum: Secondary | ICD-10-CM | POA: Diagnosis not present

## 2021-05-25 DIAGNOSIS — Z7189 Other specified counseling: Secondary | ICD-10-CM

## 2021-05-25 DIAGNOSIS — N183 Chronic kidney disease, stage 3 unspecified: Secondary | ICD-10-CM

## 2021-05-25 LAB — CBC WITH DIFFERENTIAL (CANCER CENTER ONLY)
Abs Immature Granulocytes: 0.01 10*3/uL (ref 0.00–0.07)
Basophils Absolute: 0 10*3/uL (ref 0.0–0.1)
Basophils Relative: 1 %
Eosinophils Absolute: 0.2 10*3/uL (ref 0.0–0.5)
Eosinophils Relative: 3 %
HCT: 39.5 % (ref 36.0–46.0)
Hemoglobin: 13.1 g/dL (ref 12.0–15.0)
Immature Granulocytes: 0 %
Lymphocytes Relative: 26 %
Lymphs Abs: 1.3 10*3/uL (ref 0.7–4.0)
MCH: 31.7 pg (ref 26.0–34.0)
MCHC: 33.2 g/dL (ref 30.0–36.0)
MCV: 95.6 fL (ref 80.0–100.0)
Monocytes Absolute: 0.6 10*3/uL (ref 0.1–1.0)
Monocytes Relative: 11 %
Neutro Abs: 3 10*3/uL (ref 1.7–7.7)
Neutrophils Relative %: 59 %
Platelet Count: 310 10*3/uL (ref 150–400)
RBC: 4.13 MIL/uL (ref 3.87–5.11)
RDW: 14.1 % (ref 11.5–15.5)
WBC Count: 5.1 10*3/uL (ref 4.0–10.5)
nRBC: 0 % (ref 0.0–0.2)

## 2021-05-25 LAB — CMP (CANCER CENTER ONLY)
ALT: 14 U/L (ref 0–44)
AST: 14 U/L — ABNORMAL LOW (ref 15–41)
Albumin: 4.1 g/dL (ref 3.5–5.0)
Alkaline Phosphatase: 69 U/L (ref 38–126)
Anion gap: 6 (ref 5–15)
BUN: 23 mg/dL (ref 8–23)
CO2: 28 mmol/L (ref 22–32)
Calcium: 9.6 mg/dL (ref 8.9–10.3)
Chloride: 102 mmol/L (ref 98–111)
Creatinine: 1.17 mg/dL — ABNORMAL HIGH (ref 0.44–1.00)
GFR, Estimated: 52 mL/min — ABNORMAL LOW (ref >60)
Glucose, Bld: 104 mg/dL — ABNORMAL HIGH (ref 70–99)
Potassium: 4.9 mmol/L (ref 3.5–5.1)
Sodium: 136 mmol/L (ref 135–145)
Total Bilirubin: 0.4 mg/dL (ref 0.3–1.2)
Total Protein: 6.8 g/dL (ref 6.5–8.1)

## 2021-05-25 MED ORDER — FAMOTIDINE IN NACL 20-0.9 MG/50ML-% IV SOLN
20.0000 mg | Freq: Once | INTRAVENOUS | Status: AC
  Administered 2021-05-25: 20 mg via INTRAVENOUS
  Filled 2021-05-25: qty 50

## 2021-05-25 MED ORDER — HEPARIN SOD (PORK) LOCK FLUSH 100 UNIT/ML IV SOLN
500.0000 [IU] | Freq: Once | INTRAVENOUS | Status: AC | PRN
  Administered 2021-05-25: 500 [IU]

## 2021-05-25 MED ORDER — HYDROMORPHONE HCL 2 MG PO TABS
2.0000 mg | ORAL_TABLET | ORAL | 0 refills | Status: DC | PRN
  Filled 2021-05-25: qty 30, 5d supply, fill #0

## 2021-05-25 MED ORDER — SODIUM CHLORIDE 0.9 % IV SOLN
60.0000 mg/m2 | Freq: Once | INTRAVENOUS | Status: AC
  Administered 2021-05-25: 120 mg via INTRAVENOUS
  Filled 2021-05-25: qty 20

## 2021-05-25 MED ORDER — DIPHENHYDRAMINE HCL 50 MG/ML IJ SOLN
25.0000 mg | Freq: Once | INTRAMUSCULAR | Status: AC
  Administered 2021-05-25: 25 mg via INTRAVENOUS
  Filled 2021-05-25: qty 1

## 2021-05-25 MED ORDER — SODIUM CHLORIDE 0.9 % IV SOLN: Freq: Once | INTRAVENOUS | Status: AC

## 2021-05-25 MED ORDER — DEXAMETHASONE SODIUM PHOSPHATE 10 MG/ML IJ SOLN
5.0000 mg | Freq: Once | INTRAMUSCULAR | Status: AC
  Administered 2021-05-25: 5 mg via INTRAVENOUS
  Filled 2021-05-25: qty 1

## 2021-05-25 MED ORDER — SODIUM CHLORIDE 0.9% FLUSH
10.0000 mL | INTRAVENOUS | Status: DC | PRN
  Administered 2021-05-25: 10 mL

## 2021-05-25 NOTE — Telephone Encounter (Signed)
Called back and added appt today to see Dr. Alvy Bimler at 3 pm. She is aware of appt time.  She missed lab appt yesterday and will get labs this am.

## 2021-05-25 NOTE — Patient Instructions (Signed)
[facility]   Discharge Instructions: Thank you for choosing [facility] to provide your oncology and hematology care.   If you have a lab appointment with the Cancer Center, please go directly to the Cancer Center and check in at the registration area.   Wear comfortable clothing and clothing appropriate for easy access to any Portacath or PICC line.   We strive to give you quality time with your provider. You may need to reschedule your appointment if you arrive late (15 or more minutes).  Arriving late affects you and other patients whose appointments are after yours.  Also, if you miss three or more appointments without notifying the office, you may be dismissed from the clinic at the provider's discretion.      For prescription refill requests, have your pharmacy contact our office and allow 72 hours for refills to be completed.    Today you received the following chemotherapy and/or immunotherapy agents: paclitaxel.      To help prevent nausea and vomiting after your treatment, we encourage you to take your nausea medication as directed.  BELOW ARE SYMPTOMS THAT SHOULD BE REPORTED IMMEDIATELY: *FEVER GREATER THAN 100.4 F (38 C) OR HIGHER *CHILLS OR SWEATING *NAUSEA AND VOMITING THAT IS NOT CONTROLLED WITH YOUR NAUSEA MEDICATION *UNUSUAL SHORTNESS OF BREATH *UNUSUAL BRUISING OR BLEEDING *URINARY PROBLEMS (pain or burning when urinating, or frequent urination) *BOWEL PROBLEMS (unusual diarrhea, constipation, pain near the anus) TENDERNESS IN MOUTH AND THROAT WITH OR WITHOUT PRESENCE OF ULCERS (sore throat, sores in mouth, or a toothache) UNUSUAL RASH, SWELLING OR PAIN  UNUSUAL VAGINAL DISCHARGE OR ITCHING   Items with * indicate a potential emergency and should be followed up as soon as possible or go to the Emergency Department if any problems should occur.  Please show the CHEMOTHERAPY ALERT CARD or IMMUNOTHERAPY ALERT CARD at check-in  to the Emergency Department and triage nurse.  Should you have questions after your visit or need to cancel or reschedule your appointment, please contact [facility]  Dept: [phone]  and follow the prompts.  Office hours are 8:00 a.m. to 4:30 p.m. Monday - Friday. Please note that voicemails left after 4:00 p.m. may not be returned until the following business day.  We are closed weekends and major holidays. You have access to a nurse at all times for urgent questions. Please call the main number to the clinic Dept: [phone] and follow the prompts.   For any non-urgent questions, you may also contact your provider using MyChart. We now offer e-Visits for anyone 18 and older to request care online for non-urgent symptoms. For details visit [email or web address].   Also download the MyChart app! Go to the app store, search "MyChart", open the app, select , and log in with your MyChart username and password.  Due to Covid, a mask is required upon entering the hospital/clinic. If you do not have a mask, one will be given to you upon arrival. For doctor visits, patients may have 1 support person aged 18 or older with them. For treatment visits, patients cannot have anyone with them due to current Covid guidelines and our immunocompromised population.   

## 2021-05-25 NOTE — Assessment & Plan Note (Signed)
She has mild intermittent abdominal pain Her examination is benign We will proceed with treatment as scheduled I will give her a small amount of pain medicine to take as needed When she knows about her return journey back to [facility], she will contact us and we will order CT imaging to assess response to treatment

## 2021-05-25 NOTE — Progress Notes (Signed)
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[facility]) She has mild intermittent abdominal pain Her examination is benign We will proceed with treatment as scheduled I will give her a small amount of pain medicine to take as needed When she knows about her return journey back to [facility], she will contact us and we will order CT imaging to assess response to treatment  CKD (chronic kidney disease), stage III Renal function is stable  We discussed importance of oral fluid intake  Abdominal pain Multifactorial, could be due to her cancer Constipation cannot be excluded As above, we will proceed with pain medicine as needed I also recommend regular laxative therapy  No orders of the defined types were placed in this encounter.   All questions were answered. The patient knows to call the clinic with any problems, questions or concerns. The total time spent in the appointment was 20 minutes encounter with patients including review of chart and various tests results, discussions about plan of care and coordination of care plan   Heath Lark, MD 05/25/2021 10:56 AM  INTERVAL HISTORY: Please see below for problem oriented charting. she returns for treatment follow-up She has been complaining of mild to moderate intermittent abdominal pain and left lower quadrant At worst, the pain is rated 8 out of 10 They usually last for seconds to minutes There were no precipitating factors She denies recent nausea or constipation She is getting to leave on Monday to help with her daughter in [place] who will be undergoing surgery for about 2 weeks  REVIEW OF SYSTEMS:   Constitutional: Denies fevers, chills or abnormal weight loss Eyes: Denies blurriness of vision Ears, nose, mouth, throat, and face: Denies mucositis or sore throat Respiratory: Denies cough,  dyspnea or wheezes Cardiovascular: Denies palpitation, chest discomfort or lower extremity swelling Gastrointestinal:  Denies nausea, heartburn or change in bowel habits Skin: Denies abnormal skin rashes Lymphatics: Denies new lymphadenopathy or easy bruising Neurological:Denies numbness, tingling or new weaknesses Behavioral/Psych: Mood is stable, no new changes  All other systems were reviewed with the patient and are negative.  I have reviewed the past medical history, past surgical history, social history and family history with the patient and they are unchanged from previous note.  ALLERGIES:  is allergic to codeine, hydrocodone-acetaminophen, oxycodone, thimerosal, tramadol hcl, ciprofloxacin, daypro [oxaprozin], levofloxacin, stadol [butorphanol tartrate], and sulfa antibiotics.  MEDICATIONS:  Current Outpatient Medications  Medication Sig Dispense Refill   HYDROmorphone (DILAUDID) 2 MG tablet Take 1 tablet by mouth every 4 (four) hours as needed for severe pain. 30 tablet 0   acetaminophen (TYLENOL) 500 MG tablet Take 1,000 mg by mouth every 6 (six) hours as needed for moderate pain.     Biotin 5 MG TABS Take 5 mg by mouth daily.     buPROPion (WELLBUTRIN SR) 200 MG 12 hr tablet Take 1 tablet by mouth 2 times daily. (need office visit for refills) 60 tablet 0   Ca Phosphate-Cholecalciferol (EQL CALCIUM GUMMIES) 250-400 MG-UNIT CHEW Chew 1 tablet by mouth 2 (two) times daily.     cefdinir (OMNICEF) 300 MG capsule Take 1 capsule (300 mg total) by mouth 2 (two) times daily. 6 capsule 0   escitalopram (LEXAPRO) 20 MG tablet TAKE 1 TABLET BY MOUTH ONCE A DAY (Patient taking differently: Take 20 mg by mouth daily. Taking 10 mg daily, temporarily due  to LFT'S.) 90 tablet 3   gabapentin (NEURONTIN) 300 MG capsule Take 1 capsule (300 mg total) by mouth 2 (two) times daily. 60 capsule 3   lidocaine-prilocaine (EMLA) cream Apply topically as needed. 30 g 3   LORazepam (ATIVAN) 0.5 MG tablet  Take 1 tablet (0.5 mg total) by mouth 2 (two) times daily as needed for anxiety. 30 tablet 0   metFORMIN (GLUCOPHAGE) 500 MG tablet Take 1 tablet by mouth every morning and 2 tablets every evening as directed (need office visit for refills) 90 tablet 0   mirabegron ER (MYRBETRIQ) 50 MG TB24 tablet Take 1 tablet by mouth once daily 90 tablet 3   mupirocin ointment (BACTROBAN) 2 % Apply to the inside of each nostril twice daily for five (5) days. After application, press sides of nose together and gently massage. 22 g 1   omeprazole (PRILOSEC) 20 MG capsule Take 1 capsule by mouth once a day 90 capsule 3   ondansetron (ZOFRAN) 8 MG tablet Take 1 tablet (8 mg total) by mouth every 8 (eight) hours as needed for nausea or vomiting. 60 tablet 0   Polyethyl Glycol-Propyl Glycol (SYSTANE OP) Place 1 drop into both eyes daily as needed (dry eyes).     Prenatal MV & Min w/FA-DHA (PRENATAL GUMMIES) 0.18-25 MG CHEW      Probiotic Product (ALIGN PO) Take 1 tablet by mouth daily.      prochlorperazine (COMPAZINE) 10 MG tablet Take 1 tablet by mouth every 6 hours as needed for nausea or vomiting. 30 tablet 1   No current facility-administered medications for this visit.    SUMMARY OF ONCOLOGIC HISTORY: Oncology History Overview Note  2018 - Core biopsy for ER/PR and Foundation One testing performed. Unfortunately, no sufficient tissue for Foundation One testing. ER was 50% and PR 90%. Progressed on carboplatin, exemestane, Avastin,Tamoxifen/Megace and letrozole, mixed response on Lupron  AMH: 06/23/19: 108 05/26/19: 9.04 01/05/19: 6.84 09/02/18: 3.72 06/01/18: 3.84 02/24/18: 2.6 11/21/17: 3.26 08/26/17: 1.77 05/27/17: 2.12 01/28/17: 2.47 06/10/16: 2.68 02/06/16: 1.84 05/12/15: 3.27 10/03/14: 2.12  Inhibin B 09/22/2019: 95.9 05/26/19: 90.2 01/05/19: 92 09/02/18: 70.9 06/04/18: 70 02/24/18: 79.5 11/21/17: 85.8 08/26/17: 65.6 05/27/17: 58.9 01/28/17: 198 06/10/16: 118.1 02/06/16: 62.4 05/12/15: 64.6 10/03/14: 58    Malignant granulosa cell tumor of ovary ([place])  1994 Initial Diagnosis   1994   2002 Relapse/Recurrence   Upper abdominal recurrence, resected.     - 09/2000 Chemotherapy   6 cycles of IP cisplatin and etoposide    02/2009 Relapse/Recurrence   CT showed increased size of nodules in pelvis   2010 Surgery   Exlap with section of tumor nodules near cecum and left pelvic sidewall. Tumor: ER negative, PR positive    Treatment Plan Change   Alternated 2 week courses of Megace and Tamoxifen - ended 03/2012   03/2012 PET scan   CT - progressive disease   2013 Treatment Plan Change   Letrozole   01/2016 Imaging   MRI showed progressive disease   2017 Treatment Plan Change   Two weeks of alternating Tamoxifen 67m daily and then Megace 449mTID   08/2016 Imaging   MRI showed progressive disease with peritoneal implants near liver, in pelvis   01/2017 Treatment Plan Change   Lupron 11.25 q 3 months   11/2017 Imaging   Overall mixed response.   Mixed cystic/solid lesions in the left pelvis are mildly improved.   Cystic peritoneal disease, including the dominant lesion along the posterior right hepatic  lobe, is mildly progressed.   Subcapsular lesion along the posterior right hepatic lobe is unchanged.   03/2018 Imaging   MRI A/P: Mixed response of individual peritoneal metastases in the pelvis, as described above. Overall, there has been no significant change in bulk of disease.   Stable cystic peritoneal metastatic disease along the capsular surfaces of the liver and spleen.   No new sites of metastatic disease identified within the abdomen or pelvis.   08/2018 Imaging   MRI A/P: Status post hysterectomy and bilateral salpingo-oophorectomy.   Mixed cystic/solid peritoneal implants in the abdomen/pelvis, as above. Dominant cystic implant along the posterior liver surface is mildly increased. Remaining lesions are overall grossly unchanged.   No new lesions are  identified.   01/28/2019 Imaging   Mri A/P: 1. Relatively similar appearance of peritoneal metastasis. A posterior right hepatic capsular based lesion is similar to minimally decreased in size. Left pelvic implants are primarily similar with possible enlargement of an anterior high left pelvic cystic implant. No new disease identified. 2.  Aortic Atherosclerosis (ICD10-I70.0). 3. Hepatic steatosis. 4. Left adrenal adenoma.   06/02/2019 Imaging   MRI 1. Potential slight enlargement of dominant cystic area and solid component, associated with rind like signal variation on T2 along the inferior right hepatic margin, also potentially slightly increased. Findings may still be within the realm of measurement and technical variation. Close attention on follow-up. 2. Subtle cystic changes along the cephalad margin of the spleen are difficult to see on previous imaging, perhaps new compared with prior imaging studies. 3. Pelvic implants and left lower quadrant lesion with similar size, of the area along the left iliac vasculature may be slightly larger than on the prior study. 4. Signs of extensive retroperitoneal and pelvic lymphadenectomy. 5. Hepatic steatosis. 6. 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     06/08/2019 Cancer Staging   Staging form: [place], AJCC 7th Edition - Clinical: Stage IIIC (rT2, N1, M0) - Signed by Heath Lark, MD on 06/08/2019    06/10/2019 Imaging   1. 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 These findings are slightly worsened in comparison to a remote prior CT examination dated 10/04/2014.   2.  No evidence of metastatic disease in the chest.   3. Status post hysterectomy, pelvic and retroperitoneal lymph node dissection, and ventral hernia  mesh repair.   4.  Hepatic steatosis.   5.  Aortic Atherosclerosis (ICD10-I70.0).   06/24/2019 - 09/02/2019 Chemotherapy   The patient had carboplatin for chemotherapy treatment.     09/02/2019 Tumor Marker   Patient's tumor was tested for the following markers: Inhibin B Results of the tumor marker test revealed 94   09/23/2019 Imaging   1. Interval progression of the soft tissue lesions in the anterior left pelvis, likely peritoneal implants, compatible with disease progression. Remaining sites of apparent disease along the liver capsule and posterior spleen are stable 2. Stable 2 cm left adrenal adenoma. 3. Hepatic steatosis. 4. Right-side predominant colonic diverticulosis without diverticulitis. 5. Aortic Atherosclerosis (ICD10-I70.0).   12/23/2019 Imaging   1. Slight interval increase in size of a mixed solid and cystic nodule in the left hemipelvis measuring 3.0 x 2.9 cm, previously 2.7 x 2.1 cm. 2. Interval decrease in size of a nodule in the left paracolic gutter  measuring 1.0 x 0.8 cm, previously 2.1 x 1.6 cm. 3. Stable peritoneal nodules in the hepatorenal recess and at the inferior tip of the spleen. 4. Unchanged nodule or lymph node overlying the left external iliac artery. 5. Enlargement of dominant left pelvic nodule is concerning for disease progression despite interval decrease in size of a nodule in the left paracolic gutter and stability of other nodules. 6. No evidence of metastatic disease in the chest. 7. Stable, benign left adrenal adenoma. 8. Ventral hernia mesh repair with a small component of recurrent hernia inferiorly, containing a single nonobstructed loop of small bowel. 9. Hepatic steatosis. 10. Aortic Atherosclerosis (ICD10-I70.0).   12/23/2019 Tumor Marker   Patient's tumor was tested for the following markers: Inhibin B Results of the tumor marker test revealed 94.9   01/25/2020 Tumor Marker   Patient's tumor was tested for the following markers: Inhibin  B Results of the tumor marker test revealed 116.7   02/20/2020 Genetic Testing   Negative genetic testing: no pathogenic variants detected in [facility] Multi-Cancer Panel.  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  The report date is February 20, 2020.   The Multi-Cancer Panel offered by [facility] includes sequencing and/or deletion duplication testing of the following 85 genes: AIP, ALK, APC, ATM, AXIN2,BAP1,  BARD1, BLM, BMPR1A, BRCA1, BRCA2, BRIP1, CASR, CDC73, CDH1, CDK4, CDKN1B, CDKN1C, CDKN2A (p14ARF), CDKN2A (p16INK4a), CEBPA, CHEK2, CTNNA1, DICER1, DIS3L2, EGFR (c.2369C>T, p.Thr790Met variant only), EPCAM (Deletion/duplication testing only), FH, FLCN, GATA2, GPC3, GREM1 (Promoter region deletion/duplication testing only), HOXB13 (c.251G>A, p.Gly84Glu), HRAS, KIT, MAX, MEN1, MET, MITF (c.952G>A, p.Glu318Lys variant only), MLH1, MSH2, MSH3, MSH6, MUTYH, NBN, NF1, NF2, NTHL1, PALB2, PDGFRA, PHOX2B, PMS2, POLD1, POLE, POT1, PRKAR1A, PTCH1, PTEN, RAD50, RAD51C, RAD51D, RB1, RECQL4, RET, RNF43, RUNX1, SDHAF2, SDHA (sequence changes only), SDHB, SDHC, SDHD, SMAD4, SMARCA4, SMARCB1, SMARCE1, STK11, SUFU, TERC, TERT, TMEM127, TP53, TSC1, TSC2, VHL, WRN and WT1.    03/02/2020 Tumor Marker   Patient's tumor was tested for the following markers: Inhibin B Results of the tumor marker test revealed 80.8   04/17/2020 Imaging   1. Overall, exam is stable. Multiple peritoneal nodules are again seen. The index nodule in the left lower quadrant is mildly increased in size in the interval. The lesion within the anterior left pelvis has decreased in size in the interval. There has also been decrease in size of cystic lesion within the a hepatorenal recess. The remaining peritoneal lesions are unchanged. No new lesions identified. 2. Stable left adrenal nodule. 3.  Aortic Atherosclerosis (ICD10-I70.0).     08/21/2020 Imaging   Bone density is normal AP spine T score 0.8 Femoral neck on  the left, T score -0.2 Femoral neck on the right ,T score -0.4   10/17/2020 Imaging   1. Interval progression of peritoneal nodules along the left pelvic sidewall, concerning for progressive metastatic disease. 2. Small capsular lesion medial right liver and the apparent cystic lesion superior to the right kidney are similar to prior. 3. Tiny soft tissue nodule anterior aspect of the lateral left pelvis described as decreasing on the prior study has decreased further on today's exam. 4. Stable left adrenal nodule. This cannot be definitively characterized. 5. Tiny nonobstructing stone lower pole right kidney. 6. Aortic Atherosclerosis (ICD10-I70.0).   10/26/2020 Procedure   Successful placement of a right internal jugular approach power injectable Port-A-Cath. The catheter is ready for immediate use.       10/31/2020 -  Chemotherapy   Patient is on Treatment Plan :  OVARIAN Paclitaxel P6,1,95,09 q28d     01/25/2021 Imaging   1. Signs of peritoneal disease with scattered peritoneal nodules. The index lesions are either stable or decreased in size in the interval as detailed above. No new sites of disease. 2. Ventral pelvic wall hernia contains a nonobstructed loop of small bowel. 3. Aortic Atherosclerosis (ICD10-I70.0).       PHYSICAL EXAMINATION: ECOG PERFORMANCE STATUS: 1 - Symptomatic but completely ambulatory  Vitals:   05/25/21 0800  BP: (!) 135/46  Pulse: 67  Resp: 18  Temp: (!) 97.5 F (36.4 C)  SpO2: 100%   Filed Weights   05/25/21 0800  Weight: 193 lb (87.5 kg)    GENERAL:alert, no distress and comfortable SKIN: skin color, texture, turgor are normal, no rashes or significant lesions EYES: normal, Conjunctiva are pink and non-injected, sclera clear OROPHARYNX:no exudate, no erythema and lips, buccal mucosa, and tongue normal  NECK: supple, thyroid normal size, non-tender, without nodularity LYMPH:  no palpable lymphadenopathy in the cervical, axillary or  inguinal LUNGS: clear to auscultation and percussion with normal breathing effort HEART: regular rate & rhythm and no murmurs and no lower extremity edema ABDOMEN:abdomen soft, mild tenderness on the left lower quadrant without rebound or guarding Musculoskeletal:no cyanosis of digits and no clubbing  NEURO: alert & oriented x 3 with fluent speech, no focal motor/sensory deficits  LABORATORY DATA:  I have reviewed the data as listed    Component Value Date/Time   NA 136 05/25/2021 0852   NA 139 11/30/2019 0913   NA 141 09/24/2013 1131   K 4.9 05/25/2021 0852   K 4.5 09/24/2013 1131   CL 102 05/25/2021 0852   CO2 28 05/25/2021 0852   CO2 24 09/24/2013 1131   GLUCOSE 104 (H) 05/25/2021 0852   GLUCOSE 90 09/24/2013 1131   BUN 23 05/25/2021 0852   BUN 32 (H) 11/30/2019 0913   BUN 21.2 09/24/2013 1131   CREATININE 1.17 (H) 05/25/2021 0852   CREATININE 1.1 09/24/2013 1131   CALCIUM 9.6 05/25/2021 0852   CALCIUM 9.5 09/24/2013 1131   PROT 6.8 05/25/2021 0852   PROT 7.0 11/30/2019 0913   PROT 6.8 09/24/2013 1131   ALBUMIN 4.1 05/25/2021 0852   ALBUMIN 4.3 11/30/2019 0913   ALBUMIN 3.9 09/24/2013 1131   AST 14 (L) 05/25/2021 0852   AST 23 09/24/2013 1131   ALT 14 05/25/2021 0852   ALT 27 09/24/2013 1131   ALKPHOS 69 05/25/2021 0852   ALKPHOS 81 09/24/2013 1131   BILITOT 0.4 05/25/2021 0852   BILITOT 0.53 09/24/2013 1131   GFRNONAA 52 (L) 05/25/2021 0852   GFRAA 55 (L) 01/24/2020 1530   GFRAA 57 (L) 12/21/2019 1259    No results found for: SPEP, UPEP  Lab Results  Component Value Date   WBC 5.1 05/25/2021   NEUTROABS 3.0 05/25/2021   HGB 13.1 05/25/2021   HCT 39.5 05/25/2021   MCV 95.6 05/25/2021   PLT 310 05/25/2021      Chemistry      Component Value Date/Time   NA 136 05/25/2021 0852   NA 139 11/30/2019 0913   NA 141 09/24/2013 1131   K 4.9 05/25/2021 0852   K 4.5 09/24/2013 1131   CL 102 05/25/2021 0852   CO2 28 05/25/2021 0852   CO2 24 09/24/2013 1131    BUN 23 05/25/2021 0852   BUN 32 (H) 11/30/2019 0913   BUN 21.2 09/24/2013 1131   CREATININE 1.17 (H) 05/25/2021 0852   CREATININE 1.1  09/24/2013 1131      Component Value Date/Time   CALCIUM 9.6 05/25/2021 0852   CALCIUM 9.5 09/24/2013 1131   ALKPHOS 69 05/25/2021 0852   ALKPHOS 81 09/24/2013 1131   AST 14 (L) 05/25/2021 0852   AST 23 09/24/2013 1131   ALT 14 05/25/2021 0852   ALT 27 09/24/2013 1131   BILITOT 0.4 05/25/2021 0852   BILITOT 0.53 09/24/2013 1131

## 2021-05-25 NOTE — Assessment & Plan Note (Signed)
Renal function is stable  We discussed importance of oral fluid intake

## 2021-05-25 NOTE — Assessment & Plan Note (Signed)
Multifactorial, could be due to her cancer Constipation cannot be excluded As above, we will proceed with pain medicine as needed I also recommend regular laxative therapy

## 2021-06-11 ENCOUNTER — Inpatient Hospital Stay: Payer: 59

## 2021-06-11 ENCOUNTER — Other Ambulatory Visit (HOSPITAL_COMMUNITY): Payer: Self-pay

## 2021-06-11 ENCOUNTER — Other Ambulatory Visit: Payer: Self-pay

## 2021-06-11 ENCOUNTER — Other Ambulatory Visit: Payer: Self-pay | Admitting: Hematology and Oncology

## 2021-06-11 ENCOUNTER — Telehealth: Payer: Self-pay

## 2021-06-11 ENCOUNTER — Ambulatory Visit (INDEPENDENT_AMBULATORY_CARE_PROVIDER_SITE_OTHER): Payer: 59 | Admitting: Family Medicine

## 2021-06-11 ENCOUNTER — Encounter (INDEPENDENT_AMBULATORY_CARE_PROVIDER_SITE_OTHER): Payer: Self-pay | Admitting: Family Medicine

## 2021-06-11 VITALS — BP 108/69 | HR 56 | Temp 97.7°F | Ht 66.0 in | Wt 191.0 lb

## 2021-06-11 DIAGNOSIS — C569 Malignant neoplasm of unspecified ovary: Secondary | ICD-10-CM

## 2021-06-11 DIAGNOSIS — F3289 Other specified depressive episodes: Secondary | ICD-10-CM | POA: Diagnosis not present

## 2021-06-11 DIAGNOSIS — E8881 Metabolic syndrome: Secondary | ICD-10-CM | POA: Diagnosis not present

## 2021-06-11 DIAGNOSIS — M542 Cervicalgia: Secondary | ICD-10-CM

## 2021-06-11 DIAGNOSIS — E669 Obesity, unspecified: Secondary | ICD-10-CM

## 2021-06-11 DIAGNOSIS — Z5111 Encounter for antineoplastic chemotherapy: Secondary | ICD-10-CM | POA: Diagnosis not present

## 2021-06-11 DIAGNOSIS — N183 Chronic kidney disease, stage 3 unspecified: Secondary | ICD-10-CM | POA: Diagnosis not present

## 2021-06-11 DIAGNOSIS — Z9189 Other specified personal risk factors, not elsewhere classified: Secondary | ICD-10-CM

## 2021-06-11 DIAGNOSIS — Z6830 Body mass index (BMI) 30.0-30.9, adult: Secondary | ICD-10-CM

## 2021-06-11 DIAGNOSIS — C786 Secondary malignant neoplasm of retroperitoneum and peritoneum: Secondary | ICD-10-CM | POA: Diagnosis not present

## 2021-06-11 LAB — CBC WITH DIFFERENTIAL/PLATELET
Abs Immature Granulocytes: 0.01 10*3/uL (ref 0.00–0.07)
Basophils Absolute: 0 10*3/uL (ref 0.0–0.1)
Basophils Relative: 1 %
Eosinophils Absolute: 0.1 10*3/uL (ref 0.0–0.5)
Eosinophils Relative: 2 %
HCT: 39 % (ref 36.0–46.0)
Hemoglobin: 12.9 g/dL (ref 12.0–15.0)
Immature Granulocytes: 0 %
Lymphocytes Relative: 31 %
Lymphs Abs: 1.8 10*3/uL (ref 0.7–4.0)
MCH: 31.3 pg (ref 26.0–34.0)
MCHC: 33.1 g/dL (ref 30.0–36.0)
MCV: 94.7 fL (ref 80.0–100.0)
Monocytes Absolute: 0.6 10*3/uL (ref 0.1–1.0)
Monocytes Relative: 11 %
Neutro Abs: 3.1 10*3/uL (ref 1.7–7.7)
Neutrophils Relative %: 55 %
Platelets: 281 10*3/uL (ref 150–400)
RBC: 4.12 MIL/uL (ref 3.87–5.11)
RDW: 13.5 % (ref 11.5–15.5)
WBC: 5.7 10*3/uL (ref 4.0–10.5)
nRBC: 0 % (ref 0.0–0.2)

## 2021-06-11 LAB — COMPREHENSIVE METABOLIC PANEL
ALT: 17 U/L (ref 0–44)
AST: 18 U/L (ref 15–41)
Albumin: 4.1 g/dL (ref 3.5–5.0)
Alkaline Phosphatase: 66 U/L (ref 38–126)
Anion gap: 6 (ref 5–15)
BUN: 25 mg/dL — ABNORMAL HIGH (ref 8–23)
CO2: 30 mmol/L (ref 22–32)
Calcium: 9.6 mg/dL (ref 8.9–10.3)
Chloride: 103 mmol/L (ref 98–111)
Creatinine, Ser: 1.15 mg/dL — ABNORMAL HIGH (ref 0.44–1.00)
GFR, Estimated: 53 mL/min — ABNORMAL LOW (ref >60)
Glucose, Bld: 95 mg/dL (ref 70–99)
Potassium: 4.3 mmol/L (ref 3.5–5.1)
Sodium: 139 mmol/L (ref 135–145)
Total Bilirubin: 0.4 mg/dL (ref 0.3–1.2)
Total Protein: 6.7 g/dL (ref 6.5–8.1)

## 2021-06-11 MED ORDER — BUPROPION HCL ER (SR) 200 MG PO TB12
200.0000 mg | ORAL_TABLET | Freq: Two times a day (BID) | ORAL | 0 refills | Status: DC
  Filled 2021-06-11: qty 60, 30d supply, fill #0

## 2021-06-11 MED ORDER — METFORMIN HCL 500 MG PO TABS
ORAL_TABLET | ORAL | 0 refills | Status: DC
  Filled 2021-06-11: qty 90, 30d supply, fill #0

## 2021-06-11 NOTE — Telephone Encounter (Signed)
Tell her to take tylenol prn for now We need the CT to assess, if she wants to get it done sooner that would help

## 2021-06-11 NOTE — Telephone Encounter (Signed)
Lab appt added at 1 pm today. [name] was able to get CT appt tomorrow at 10 am.  When do you want to see her to review results?

## 2021-06-11 NOTE — Telephone Encounter (Signed)
Called back and given below message. CT of the chest added. She verbalized understanding and will call to get earlier scan appt.

## 2021-06-11 NOTE — Telephone Encounter (Signed)
Add her on at 840 am on Thursday, we might be able to move things around

## 2021-06-11 NOTE — Telephone Encounter (Signed)
Called and scheduled appt to see Dr. Alvy Bimler 2/23 at [facility]. She is aware of appt date/time.

## 2021-06-11 NOTE — Telephone Encounter (Signed)
She called to get lab appt added prior to CT scan on 2/24, lab appt added.  She is complaining of right sided neck pain on the same side as her port. The pain started recently. Her port scar used to be above her port and now it is below her port.

## 2021-06-12 ENCOUNTER — Ambulatory Visit (HOSPITAL_COMMUNITY)
Admission: RE | Admit: 2021-06-12 | Discharge: 2021-06-12 | Disposition: A | Discharge status: Home / Self Care | Payer: 59 | Source: Ambulatory Visit | Attending: Hematology and Oncology | Admitting: Hematology and Oncology

## 2021-06-12 ENCOUNTER — Encounter (HOSPITAL_COMMUNITY): Payer: Self-pay

## 2021-06-12 DIAGNOSIS — M542 Cervicalgia: Secondary | ICD-10-CM | POA: Diagnosis not present

## 2021-06-12 DIAGNOSIS — I700 Atherosclerosis of aorta: Secondary | ICD-10-CM | POA: Diagnosis not present

## 2021-06-12 DIAGNOSIS — R911 Solitary pulmonary nodule: Secondary | ICD-10-CM | POA: Diagnosis not present

## 2021-06-12 DIAGNOSIS — C569 Malignant neoplasm of unspecified ovary: Secondary | ICD-10-CM | POA: Diagnosis not present

## 2021-06-12 DIAGNOSIS — G4733 Obstructive sleep apnea (adult) (pediatric): Secondary | ICD-10-CM | POA: Diagnosis not present

## 2021-06-12 DIAGNOSIS — K760 Fatty (change of) liver, not elsewhere classified: Secondary | ICD-10-CM | POA: Diagnosis not present

## 2021-06-12 MED ORDER — IOHEXOL 300 MG/ML  SOLN
100.0000 mL | Freq: Once | INTRAMUSCULAR | Status: AC | PRN
  Administered 2021-06-12: 100 mL via INTRAVENOUS

## 2021-06-12 MED ORDER — SODIUM CHLORIDE (PF) 0.9 % IJ SOLN
INTRAMUSCULAR | Status: AC
  Filled 2021-06-12: qty 50

## 2021-06-12 NOTE — Progress Notes (Signed)
Chief Complaint:   OBESITY [name] is here to discuss her progress with her obesity treatment plan along with follow-up of her obesity related diagnoses. [name] is on keeping a food journal and adhering to recommended goals of 1200 calories and 80+ grams of protein daily and states she is following her eating plan approximately 85% of the time. [name] states she is walking and swimming for 30-60 minutes 3-5 times per week.  Today's visit was #: 93 Starting weight: 209 lbs Starting date: 04/29/2018 Today's weight: 191 lbs Today's date: 06/11/2021 Total lbs lost to date: 18 Total lbs lost since last in-office visit: 0  Interim History:  [name] has more stress in her life lately with her daughter's recent medical scare and her 65 year old mother not doing well physically.  She is not surprised that she gained weight, but only 1 lb!   She is doing a great job with exercising still but has just been emotionally snacking more as of late.  Subjective:   1. Insulin resistance [name] notes more emotional eating lately and more snacking.  2. Other depression, with emotional eating [name] reports mild tremors but they re manageable. She has more stressors lately and she wishes to continue her current medications. She is sleeping well.  3. At risk for malnutrition [name] is at increased risk for malnutrition due to more snacking and not getting in protein.  Assessment/Plan:  No orders of the defined types were placed in this encounter.   Medications Discontinued During This Encounter  Medication Reason   cefdinir (OMNICEF) 300 MG capsule    buPROPion (WELLBUTRIN SR) 200 MG 12 hr tablet Reorder   metFORMIN (GLUCOPHAGE) 500 MG tablet Reorder     Meds ordered this encounter  Medications   buPROPion (WELLBUTRIN SR) 200 MG 12 hr tablet    Sig: Take 1 tablet by mouth 2 times daily. (need office visit for refills)    Dispense:  60 tablet    Refill:  0    30 d supply; ov for RF   metFORMIN  (GLUCOPHAGE) 500 MG tablet    Sig: Take 1 tablet by mouth every morning and 2 tablets every evening as directed (need office visit for refills)    Dispense:  90 tablet    Refill:  0    30 d supply; ov for RF     1. Insulin resistance We will refill metformin for 1 month at the same dose. [name] will increase her protein and decrease simple carbohydrates by journaling and hitting goals. [name] agreed to follow-up with [name] as directed to closely monitor her progress.  - metFORMIN (GLUCOPHAGE) 500 MG tablet; Take 1 tablet by mouth every morning and 2 tablets every evening as directed (need office visit for refills)  Dispense: 90 tablet; Refill: 0  2. Other depression, with emotional eating We will refill Wellbutrin SR for 1 month and pt is aware that buproprion may be contributing to tremors but she feels the benefits outweigh the risks.  She agrees to certainly notify us if symptoms worsen and we will likely decrease or DC dose of med at that time.   - [name] will increase her exercise and/or continue for help with stress management. Orders and follow up as documented in patient record.   - buPROPion (WELLBUTRIN SR) 200 MG 12 hr tablet; Take 1 tablet by mouth 2 times daily. (need office visit for refills)  Dispense: 60 tablet; Refill: 0  3. At risk for malnutrition [name]  was given approximately 9 minutes of counseling today regarding prevention of malnutrition and ways to meet macronutrient goals.  Counseled her that malnutrition refers to inappropriate nutrients or not the right balance of nutrients for optimal health.  Discussed with [name] that it is absolutely possible to be malnourished but yet obese.  Risk factors, including but not limited to, inappropriate dietary choices, difficulty with obtaining food due to physical or financial limitations, and various physical and mental health conditions were reviewed with [name].     4. Obesity with current BMI of 30.9 [name] is currently  in the action stage of change. As such, her goal is to continue with weight loss efforts. She has agreed to keeping a food journal and adhering to recommended goals of 1200 calories and 80+ grams of protein daily.   [name] is to come to her next appointment fasting for labs  Exercise goals: As is.  Behavioral modification strategies: increasing lean protein intake, planning for success, and keeping a strict food journal.  [name] has agreed to follow-up with our clinic in 3 to 4 weeks for fasting labs. She was informed of the importance of frequent follow-up visits to maximize her success with intensive lifestyle modifications for her multiple health conditions.   Objective:   Blood pressure 108/69, pulse (!) 56, temperature 97.7 F (36.5 C), temperature source Oral, height 5\' 6"  (1.676 m), weight 191 lb (86.6 kg), SpO2 97 %. Body mass index is 30.83 kg/m.  General: Cooperative, alert, well developed, in no acute distress. HEENT: Conjunctivae and lids unremarkable. Cardiovascular: Regular rhythm.  Lungs: Normal work of breathing. Neurologic: No focal deficits.   Lab Results  Component Value Date   CREATININE 1.15 (H) 06/11/2021   BUN 25 (H) 06/11/2021   NA 139 06/11/2021   K 4.3 06/11/2021   CL 103 06/11/2021   CO2 30 06/11/2021   Lab Results  Component Value Date   ALT 17 06/11/2021   AST 18 06/11/2021   ALKPHOS 66 06/11/2021   BILITOT 0.4 06/11/2021   Lab Results  Component Value Date   HGBA1C 5.2 11/30/2019   HGBA1C 5.3 05/31/2019   HGBA1C 5.2 11/19/2018   HGBA1C 5.5 04/29/2018   Lab Results  Component Value Date   INSULIN 23.8 11/30/2019   INSULIN 16.5 05/31/2019   INSULIN 17.6 11/19/2018   INSULIN 25.8 (H) 04/29/2018   Lab Results  Component Value Date   TSH 1.150 02/23/2021   Lab Results  Component Value Date   CHOL 224 (H) 11/30/2019   HDL 52 11/30/2019   LDLCALC 136 (H) 11/30/2019   TRIG 201 (H) 11/30/2019   Lab Results  Component Value Date    VD25OH 59.0 11/30/2019   VD25OH 54.0 05/31/2019   VD25OH 42.8 11/19/2018   Lab Results  Component Value Date   WBC 5.7 06/11/2021   HGB 12.9 06/11/2021   HCT 39.0 06/11/2021   MCV 94.7 06/11/2021   PLT 281 06/11/2021   No results found for: IRON, TIBC, FERRITIN  Attestation Statements:   Reviewed by clinician on day of visit: allergies, medications, problem list, medical history, surgical history, family history, social history, and previous encounter notes.  Wilhemena Durie, am acting as transcriptionist for [employer], DO.  I have reviewed the above documentation for accuracy and completeness, and I agree with the above. Marjory Sneddon, D.O.  The [place] was signed into law in 2016 which includes the topic of electronic health  records.  This provides immediate access to information in MyChart.  This includes consultation notes, operative notes, office notes, lab results and pathology reports.  If you have any questions about what you read please let us know at your next visit so we can discuss your concerns and take corrective action if need be.  We are right here with you.

## 2021-06-14 ENCOUNTER — Inpatient Hospital Stay (HOSPITAL_BASED_OUTPATIENT_CLINIC_OR_DEPARTMENT_OTHER): Payer: 59 | Admitting: Hematology and Oncology

## 2021-06-14 ENCOUNTER — Other Ambulatory Visit (HOSPITAL_COMMUNITY): Payer: Self-pay

## 2021-06-14 ENCOUNTER — Ambulatory Visit (INDEPENDENT_AMBULATORY_CARE_PROVIDER_SITE_OTHER): Payer: 59 | Admitting: Family Medicine

## 2021-06-14 ENCOUNTER — Ambulatory Visit (HOSPITAL_COMMUNITY)
Admission: RE | Admit: 2021-06-14 | Discharge: 2021-06-14 | Disposition: A | Discharge status: Home / Self Care | Payer: 59 | Source: Ambulatory Visit | Attending: Hematology and Oncology | Admitting: Hematology and Oncology

## 2021-06-14 ENCOUNTER — Other Ambulatory Visit: Payer: Self-pay

## 2021-06-14 ENCOUNTER — Encounter: Payer: Self-pay | Admitting: Hematology and Oncology

## 2021-06-14 VITALS — BP 108/45 | HR 64 | Temp 97.8°F | Resp 18 | Ht 66.0 in | Wt 194.0 lb

## 2021-06-14 DIAGNOSIS — C569 Malignant neoplasm of unspecified ovary: Secondary | ICD-10-CM | POA: Insufficient documentation

## 2021-06-14 DIAGNOSIS — M542 Cervicalgia: Secondary | ICD-10-CM

## 2021-06-14 DIAGNOSIS — I82C19 Acute embolism and thrombosis of unspecified internal jugular vein: Secondary | ICD-10-CM | POA: Insufficient documentation

## 2021-06-14 DIAGNOSIS — G620 Drug-induced polyneuropathy: Secondary | ICD-10-CM

## 2021-06-14 DIAGNOSIS — N183 Chronic kidney disease, stage 3 unspecified: Secondary | ICD-10-CM | POA: Diagnosis not present

## 2021-06-14 DIAGNOSIS — I82C11 Acute embolism and thrombosis of right internal jugular vein: Secondary | ICD-10-CM

## 2021-06-14 DIAGNOSIS — T451X5A Adverse effect of antineoplastic and immunosuppressive drugs, initial encounter: Secondary | ICD-10-CM

## 2021-06-14 MED ORDER — RIVAROXABAN (XARELTO) VTE STARTER PACK (15 & 20 MG)
ORAL_TABLET | ORAL | 0 refills | Status: DC
  Filled 2021-06-14: qty 51, 30d supply, fill #0

## 2021-06-14 NOTE — Assessment & Plan Note (Signed)
Her renal function is stable Observe closely 

## 2021-06-14 NOTE — Progress Notes (Signed)
Right upper extremity venous duplex completed. Refer to "CV Proc" under chart review to view preliminary results.  Preliminary results discussed with Hassan Rowan.  06/14/2021 3:15 PM Kelby Aline., MHA, RVT, RDCS, RDMS

## 2021-06-14 NOTE — Assessment & Plan Note (Signed)
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

## 2021-06-14 NOTE — Assessment & Plan Note (Signed)
She denies significant worsening peripheral neuropathy We discussed the use of cryo treatment

## 2021-06-14 NOTE — Assessment & Plan Note (Signed)
I have reviewed multiple CT imaging with the patient Overall, she had a positive response to treatment We will continue treatment with paclitaxel for a few more months, plan to repeat imaging study again in June

## 2021-06-14 NOTE — Progress Notes (Signed)
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 ([place]) I reviewed CT imaging of the chest with the patient The tip of the catheter is in the superior vena cava She is noted to have indeterminate age venous thrombus in the internal jugular and subclavian veins, likely contributing to her pain We discussed the role of anticoagulation therapy versus discontinuation of port Given the fact that she needs long-term chemotherapy, she would still need the port I will put her on Xarelto We discussed risk of bleeding  CKD (chronic kidney disease), stage III Her renal function is stable Observe closely  Peripheral neuropathy due to chemotherapy [facility]) She denies significant worsening peripheral neuropathy We discussed the use of cryo treatment  No orders of the defined types were placed in this encounter.   All questions were answered. The patient knows to call the clinic with any problems, questions or concerns. The total time spent in the appointment was 60 minutes encounter with patients including review of chart and various tests results, discussions about plan of care and coordination of care plan   Heath Lark, MD 06/14/2021 4:58 PM  INTERVAL HISTORY: Please see below for problem oriented charting. she returns for treatment follow-up and review of imaging study The patient was actually seen twice She was seen this morning to review CT imaging for assessment of response to treatment She was seen again later today when she was found to have indeterminate internal jugular  thrombosis This morning, she has been complaining of musculoskeletal pain near her right side of the neck close to the port area The patient returns after helping her daughter out of state She admits to some heavy lifting She complained of nonspecific abdominal discomfort No nausea or recent changes in bowel habits She denies peripheral neuropathy when she started using ice treatment while receiving chemotherapy  REVIEW OF SYSTEMS:   Constitutional: Denies fevers, chills or abnormal weight loss Eyes: Denies blurriness of vision Ears, nose, mouth, throat, and face: Denies mucositis or sore throat Respiratory: Denies cough, dyspnea or wheezes Cardiovascular: Denies palpitation, chest discomfort or lower extremity swelling Gastrointestinal:  Denies nausea, heartburn or change in bowel habits Skin: Denies abnormal skin rashes Lymphatics: Denies new lymphadenopathy or easy bruising Behavioral/Psych: Mood is stable, no new changes  All other systems were reviewed with the patient and are negative.  I have reviewed the past medical history, past surgical history, social history and family history with the patient and they are unchanged from previous note.  ALLERGIES:  is allergic to codeine, hydrocodone-acetaminophen, oxycodone, thimerosal, tramadol hcl, ciprofloxacin, daypro [oxaprozin], levofloxacin, stadol [butorphanol tartrate], and sulfa antibiotics.  MEDICATIONS:  Current Outpatient Medications  Medication Sig Dispense Refill   RIVAROXABAN (XARELTO) VTE STARTER PACK (15 & 20 MG) Follow package directions: Take one 14m tablet by mouth twice a day. On day 22, switch to one 274mtablet once a day. Take with food. 51 each 0   acetaminophen (TYLENOL) 500 MG tablet Take 1,000 mg by mouth every 6 (six) hours as needed for moderate pain.     Biotin 5 MG TABS Take 5 mg  by mouth daily.     buPROPion (WELLBUTRIN SR) 200 MG 12 hr tablet Take 1 tablet by mouth 2 times daily. (need office visit for  refills) 60 tablet 0   Ca Phosphate-Cholecalciferol (EQL CALCIUM GUMMIES) 250-400 MG-UNIT CHEW Chew 1 tablet by mouth 2 (two) times daily.     escitalopram (LEXAPRO) 20 MG tablet TAKE 1 TABLET BY MOUTH ONCE A DAY (Patient taking differently: Take 20 mg by mouth daily. Taking 10 mg daily, temporarily due to LFT'S.) 90 tablet 3   gabapentin (NEURONTIN) 300 MG capsule Take 1 capsule (300 mg total) by mouth 2 (two) times daily. 60 capsule 3   HYDROmorphone (DILAUDID) 2 MG tablet Take 1 tablet by mouth every 4 (four) hours as needed for severe pain. 30 tablet 0   lidocaine-prilocaine (EMLA) cream Apply topically as needed. 30 g 3   LORazepam (ATIVAN) 0.5 MG tablet Take 1 tablet (0.5 mg total) by mouth 2 (two) times daily as needed for anxiety. 30 tablet 0   metFORMIN (GLUCOPHAGE) 500 MG tablet Take 1 tablet by mouth every morning and 2 tablets every evening as directed (need office visit for refills) 90 tablet 0   mirabegron ER (MYRBETRIQ) 50 MG TB24 tablet Take 1 tablet by mouth once daily 90 tablet 3   mupirocin ointment (BACTROBAN) 2 % Apply to the inside of each nostril twice daily for five (5) days. After application, press sides of nose together and gently massage. 22 g 1   omeprazole (PRILOSEC) 20 MG capsule Take 1 capsule by mouth once a day 90 capsule 3   ondansetron (ZOFRAN) 8 MG tablet Take 1 tablet (8 mg total) by mouth every 8 (eight) hours as needed for nausea or vomiting. 60 tablet 0   Polyethyl Glycol-Propyl Glycol (SYSTANE OP) Place 1 drop into both eyes daily as needed (dry eyes).     Prenatal MV & Min w/FA-DHA (PRENATAL GUMMIES) 0.18-25 MG CHEW      Probiotic Product (ALIGN PO) Take 1 tablet by mouth daily.      prochlorperazine (COMPAZINE) 10 MG tablet Take 1 tablet by mouth every 6 hours as needed for nausea or vomiting. 30 tablet 1   No current facility-administered medications for this visit.    SUMMARY OF ONCOLOGIC HISTORY: Oncology History Overview Note  2018 - Core biopsy  for ER/PR and Foundation One testing performed. Unfortunately, no sufficient tissue for Foundation One testing. ER was 50% and PR 90%. Progressed on carboplatin, exemestane, Avastin,Tamoxifen/Megace and letrozole, mixed response on Lupron  AMH: 06/23/19: 108 05/26/19: 9.04 01/05/19: 6.84 09/02/18: 3.72 06/01/18: 3.84 02/24/18: 2.6 11/21/17: 3.26 08/26/17: 1.77 05/27/17: 2.12 01/28/17: 2.47 06/10/16: 2.68 02/06/16: 1.84 05/12/15: 3.27 10/03/14: 2.12  Inhibin B 09/22/2019: 95.9 05/26/19: 90.2 01/05/19: 92 09/02/18: 70.9 06/04/18: 70 02/24/18: 79.5 11/21/17: 85.8 08/26/17: 65.6 05/27/17: 58.9 01/28/17: 198 06/10/16: 118.1 02/06/16: 62.4 05/12/15: 64.6 10/03/14: 58   Malignant granulosa cell tumor of ovary ([place])  1994 Initial Diagnosis   1994   2002 Relapse/Recurrence   Upper abdominal recurrence, resected.     - 09/2000 Chemotherapy   6 cycles of IP cisplatin and etoposide    02/2009 Relapse/Recurrence   CT showed increased size of nodules in pelvis   2010 Surgery   Exlap with section of tumor nodules near cecum and left pelvic sidewall. Tumor: ER negative, PR positive    Treatment Plan Change   Alternated 2 week courses of Megace and Tamoxifen - ended 03/2012   03/2012 PET scan   CT -  progressive disease   2013 Treatment Plan Change   Letrozole   01/2016 Imaging   MRI showed progressive disease   2017 Treatment Plan Change   Two weeks of alternating Tamoxifen 75m daily and then Megace 460mTID   08/2016 Imaging   MRI showed progressive disease with peritoneal implants near liver, in pelvis   01/2017 Treatment Plan Change   Lupron 11.25 q 3 months   11/2017 Imaging   Overall mixed response.   Mixed cystic/solid lesions in the left pelvis are mildly improved.   Cystic peritoneal disease, including the dominant lesion along the posterior right hepatic lobe, is mildly progressed.   Subcapsular lesion along the posterior right hepatic lobe is unchanged.   03/2018 Imaging   MRI  A/P: Mixed response of individual peritoneal metastases in the pelvis, as described above. Overall, there has been no significant change in bulk of disease.   Stable cystic peritoneal metastatic disease along the capsular surfaces of the liver and spleen.   No new sites of metastatic disease identified within the abdomen or pelvis.   08/2018 Imaging   MRI A/P: Status post hysterectomy and bilateral salpingo-oophorectomy.   Mixed cystic/solid peritoneal implants in the abdomen/pelvis, as above. Dominant cystic implant along the posterior liver surface is mildly increased. Remaining lesions are overall grossly unchanged.   No new lesions are identified.   01/28/2019 Imaging   Mri A/P: 1. Relatively similar appearance of peritoneal metastasis. A posterior right hepatic capsular based lesion is similar to minimally decreased in size. Left pelvic implants are primarily similar with possible enlargement of an anterior high left pelvic cystic implant. No new disease identified. 2.  Aortic Atherosclerosis (ICD10-I70.0). 3. Hepatic steatosis. 4. Left adrenal adenoma.   06/02/2019 Imaging   MRI 1. Potential slight enlargement of dominant cystic area and solid component, associated with rind like signal variation on T2 along the inferior right hepatic margin, also potentially slightly increased. Findings may still be within the realm of measurement and technical variation. Close attention on follow-up. 2. Subtle cystic changes along the cephalad margin of the spleen are difficult to see on previous imaging, perhaps new compared with prior imaging studies. 3. Pelvic implants and left lower quadrant lesion with similar size, of the area along the left iliac vasculature may be slightly larger than on the prior study. 4. Signs of extensive retroperitoneal and pelvic lymphadenectomy. 5. Hepatic steatosis. 6. 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      06/08/2019 Cancer Staging   Staging form: [place], AJCC 7th Edition - Clinical: Stage IIIC (rT2, N1, M0) - Signed by GoHeath LarkMD on 06/08/2019    06/10/2019 Imaging   1. 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 These findings are slightly worsened in comparison to a remote prior CT examination dated 10/04/2014.   2.  No evidence of metastatic disease in the chest.   3. Status post hysterectomy, pelvic and retroperitoneal lymph node dissection, and ventral hernia mesh repair.   4.  Hepatic steatosis.   5.  Aortic Atherosclerosis (ICD10-I70.0).   06/24/2019 - 09/02/2019 Chemotherapy   The patient had carboplatin for chemotherapy treatment.     09/02/2019 Tumor Marker   Patient's tumor was tested for the following markers: Inhibin B Results of the tumor marker test revealed 94  09/23/2019 Imaging   1. Interval progression of the soft tissue lesions in the anterior left pelvis, likely peritoneal implants, compatible with disease progression. Remaining sites of apparent disease along the liver capsule and posterior spleen are stable 2. Stable 2 cm left adrenal adenoma. 3. Hepatic steatosis. 4. Right-side predominant colonic diverticulosis without diverticulitis. 5. Aortic Atherosclerosis (ICD10-I70.0).   12/23/2019 Imaging   1. Slight interval increase in size of a mixed solid and cystic nodule in the left hemipelvis measuring 3.0 x 2.9 cm, previously 2.7 x 2.1 cm. 2. Interval decrease in size of a nodule in the left paracolic gutter measuring 1.0 x 0.8 cm, previously 2.1 x 1.6 cm. 3. Stable peritoneal nodules in the hepatorenal recess and at the inferior tip of the spleen. 4. Unchanged nodule or lymph node overlying the left external iliac artery. 5. Enlargement of dominant left pelvic nodule is concerning for  disease progression despite interval decrease in size of a nodule in the left paracolic gutter and stability of other nodules. 6. No evidence of metastatic disease in the chest. 7. Stable, benign left adrenal adenoma. 8. Ventral hernia mesh repair with a small component of recurrent hernia inferiorly, containing a single nonobstructed loop of small bowel. 9. Hepatic steatosis. 10. Aortic Atherosclerosis (ICD10-I70.0).   12/23/2019 Tumor Marker   Patient's tumor was tested for the following markers: Inhibin B Results of the tumor marker test revealed 94.9   01/25/2020 Tumor Marker   Patient's tumor was tested for the following markers: Inhibin B Results of the tumor marker test revealed 116.7   02/20/2020 Genetic Testing   Negative genetic testing: no pathogenic variants detected in [facility] Multi-Cancer Panel.  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  The report date is February 20, 2020.   The Multi-Cancer Panel offered by [facility] includes sequencing and/or deletion duplication testing of the following 85 genes: AIP, ALK, APC, ATM, AXIN2,BAP1,  BARD1, BLM, BMPR1A, BRCA1, BRCA2, BRIP1, CASR, CDC73, CDH1, CDK4, CDKN1B, CDKN1C, CDKN2A (p14ARF), CDKN2A (p16INK4a), CEBPA, CHEK2, CTNNA1, DICER1, DIS3L2, EGFR (c.2369C>T, p.Thr790Met variant only), EPCAM (Deletion/duplication testing only), FH, FLCN, GATA2, GPC3, GREM1 (Promoter region deletion/duplication testing only), HOXB13 (c.251G>A, p.Gly84Glu), HRAS, KIT, MAX, MEN1, MET, MITF (c.952G>A, p.Glu318Lys variant only), MLH1, MSH2, MSH3, MSH6, MUTYH, NBN, NF1, NF2, NTHL1, PALB2, PDGFRA, PHOX2B, PMS2, POLD1, POLE, POT1, PRKAR1A, PTCH1, PTEN, RAD50, RAD51C, RAD51D, RB1, RECQL4, RET, RNF43, RUNX1, SDHAF2, SDHA (sequence changes only), SDHB, SDHC, SDHD, SMAD4, SMARCA4, SMARCB1, SMARCE1, STK11, SUFU, TERC, TERT, TMEM127, TP53, TSC1, TSC2, VHL, WRN and WT1.    03/02/2020 Tumor Marker   Patient's tumor was tested for the following  markers: Inhibin B Results of the tumor marker test revealed 80.8   04/17/2020 Imaging   1. Overall, exam is stable. Multiple peritoneal nodules are again seen. The index nodule in the left lower quadrant is mildly increased in size in the interval. The lesion within the anterior left pelvis has decreased in size in the interval. There has also been decrease in size of cystic lesion within the a hepatorenal recess. The remaining peritoneal lesions are unchanged. No new lesions identified. 2. Stable left adrenal nodule. 3.  Aortic Atherosclerosis (ICD10-I70.0).     08/21/2020 Imaging   Bone density is normal AP spine T score 0.8 Femoral neck on the left, T score -0.2 Femoral neck on the right ,T score -0.4   10/17/2020 Imaging   1. Interval progression of peritoneal nodules along the left pelvic sidewall, concerning for progressive metastatic disease. 2. Small capsular lesion  medial right liver and the apparent cystic lesion superior to the right kidney are similar to prior. 3. Tiny soft tissue nodule anterior aspect of the lateral left pelvis described as decreasing on the prior study has decreased further on today's exam. 4. Stable left adrenal nodule. This cannot be definitively characterized. 5. Tiny nonobstructing stone lower pole right kidney. 6. Aortic Atherosclerosis (ICD10-I70.0).   10/26/2020 Procedure   Successful placement of a right internal jugular approach power injectable Port-A-Cath. The catheter is ready for immediate use.       10/31/2020 -  Chemotherapy   Patient is on Treatment Plan : OVARIAN Paclitaxel X6,1,47,09 q28d     01/25/2021 Imaging   1. Signs of peritoneal disease with scattered peritoneal nodules. The index lesions are either stable or decreased in size in the interval as detailed above. No new sites of disease. 2. Ventral pelvic wall hernia contains a nonobstructed loop of small bowel. 3. Aortic Atherosclerosis (ICD10-I70.0).     06/14/2021 Imaging   1.  Multiple peritoneal metastatic nodules are slightly diminished in size. 2. A left external iliac lymph node or nodule is however stable to slightly enlarged. 3. Overall findings are most consistent with continued treatment response of metastatic disease. 4. No evidence of metastatic disease within the chest. 5. Hepatic steatosis.     06/14/2021 Imaging   Right: Findings consistent with 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   Left: No evidence of thrombosis in the subclavian.     PHYSICAL EXAMINATION: ECOG PERFORMANCE STATUS: 1 - Symptomatic but completely ambulatory  Vitals:   06/14/21 0828  BP: (!) 108/45  Pulse: 64  Resp: 18  Temp: 97.8 F (36.6 C)  SpO2: 99%   Filed Weights   06/14/21 0828  Weight: 194 lb (88 kg)    GENERAL:alert, no distress and comfortable NEURO: alert & oriented x 3 with fluent speech, no focal motor/sensory deficits  LABORATORY DATA:  I have reviewed the data as listed    Component Value Date/Time   NA 139 06/11/2021 1335   NA 139 11/30/2019 0913   NA 141 09/24/2013 1131   K 4.3 06/11/2021 1335   K 4.5 09/24/2013 1131   CL 103 06/11/2021 1335   CO2 30 06/11/2021 1335   CO2 24 09/24/2013 1131   GLUCOSE 95 06/11/2021 1335   GLUCOSE 90 09/24/2013 1131   BUN 25 (H) 06/11/2021 1335   BUN 32 (H) 11/30/2019 0913   BUN 21.2 09/24/2013 1131   CREATININE 1.15 (H) 06/11/2021 1335   CREATININE 1.17 (H) 05/25/2021 0852   CREATININE 1.1 09/24/2013 1131   CALCIUM 9.6 06/11/2021 1335   CALCIUM 9.5 09/24/2013 1131   PROT 6.7 06/11/2021 1335   PROT 7.0 11/30/2019 0913   PROT 6.8 09/24/2013 1131   ALBUMIN 4.1 06/11/2021 1335   ALBUMIN 4.3 11/30/2019 0913   ALBUMIN 3.9 09/24/2013 1131   AST 18 06/11/2021 1335   AST 14 (L) 05/25/2021 0852   AST 23 09/24/2013 1131   ALT 17 06/11/2021 1335   ALT 14 05/25/2021 0852   ALT 27 09/24/2013 1131   ALKPHOS 66 06/11/2021 1335   ALKPHOS  81 09/24/2013 1131   BILITOT 0.4 06/11/2021 1335   BILITOT 0.4 05/25/2021 0852   BILITOT 0.53 09/24/2013 1131   GFRNONAA 53 (L) 06/11/2021 1335   GFRNONAA 52 (L) 05/25/2021 0852   GFRAA 55 (L) 01/24/2020 1530   GFRAA 57 (L) 12/21/2019 1259    No  results found for: SPEP, UPEP  Lab Results  Component Value Date   WBC 5.7 06/11/2021   NEUTROABS 3.1 06/11/2021   HGB 12.9 06/11/2021   HCT 39.0 06/11/2021   MCV 94.7 06/11/2021   PLT 281 06/11/2021      Chemistry      Component Value Date/Time   NA 139 06/11/2021 1335   NA 139 11/30/2019 0913   NA 141 09/24/2013 1131   K 4.3 06/11/2021 1335   K 4.5 09/24/2013 1131   CL 103 06/11/2021 1335   CO2 30 06/11/2021 1335   CO2 24 09/24/2013 1131   BUN 25 (H) 06/11/2021 1335   BUN 32 (H) 11/30/2019 0913   BUN 21.2 09/24/2013 1131   CREATININE 1.15 (H) 06/11/2021 1335   CREATININE 1.17 (H) 05/25/2021 0852   CREATININE 1.1 09/24/2013 1131      Component Value Date/Time   CALCIUM 9.6 06/11/2021 1335   CALCIUM 9.5 09/24/2013 1131   ALKPHOS 66 06/11/2021 1335   ALKPHOS 81 09/24/2013 1131   AST 18 06/11/2021 1335   AST 14 (L) 05/25/2021 0852   AST 23 09/24/2013 1131   ALT 17 06/11/2021 1335   ALT 14 05/25/2021 0852   ALT 27 09/24/2013 1131   BILITOT 0.4 06/11/2021 1335   BILITOT 0.4 05/25/2021 0852   BILITOT 0.53 09/24/2013 1131       RADIOGRAPHIC STUDIES: I have reviewed multiple imaging studies with the patient I have personally reviewed the radiological images as listed and agreed with the findings in the report. CT CHEST W CONTRAST  Result Date: 06/13/2021 CLINICAL DATA:  Ovarian cancer, assess treatment response EXAM: CT CHEST, ABDOMEN, AND PELVIS WITH CONTRAST TECHNIQUE: Multidetector CT imaging of the chest, abdomen and pelvis was performed following the standard protocol during bolus administration of intravenous contrast. RADIATION DOSE REDUCTION: This exam was performed according to the departmental dose-optimization  program which includes automated exposure control, adjustment of the mA and/or kV according to patient size and/or use of iterative reconstruction technique. CONTRAST:  180m OMNIPAQUE IOHEXOL 300 MG/ML SOLN, additional oral enteric contrast COMPARISON:  CT abdomen pelvis, 01/23/2021, 10/16/2020, CT chest, 12/22/2019 FINDINGS: CT CHEST FINDINGS Cardiovascular: Right chest port catheter. Aortic atherosclerosis. Normal heart size. No pericardial effusion. Mediastinum/Nodes: No enlarged mediastinal, hilar, or axillary lymph nodes. Thyroid gland, trachea, and esophagus demonstrate no significant findings. Lungs/Pleura: Lungs are clear. No pleural effusion or pneumothorax. Musculoskeletal: No chest wall mass or suspicious osseous lesions identified. CT ABDOMEN PELVIS FINDINGS Hepatobiliary: No solid liver abnormality is seen. Hepatic steatosis. No gallstones, gallbladder wall thickening, or biliary dilatation. Pancreas: Unremarkable. No pancreatic ductal dilatation or surrounding inflammatory changes. Spleen: Normal in size. Subcapsular cystic lesion of the inferior tip of the spleen is unchanged measuring 1.2 x 1.2 cm (series 2, image 60). Adrenals/Urinary Tract: Unchanged left lateral limb adrenal adenoma measuring 1.8 x 1.4 cm (series 2, image 59). Partially exophytic benign simple cyst of the lateral midportion of the left kidney. Kidneys are otherwise normal, without renal calculi, solid lesion, or hydronephrosis. Bladder is unremarkable. Stomach/Bowel: Stomach is within normal limits. Appendix not clearly visualized. No evidence of bowel wall thickening, distention, or inflammatory changes. Vascular/Lymphatic: Aortic atherosclerosis. Left external iliac lymph node is stable to slightly enlarged measuring 1.4 x 1.4 cm, previously 1.3 x 1.2 cm (series 2, image 100). Reproductive: Status post hysterectomy and oophorectomy. Other: Ventral mesh hernia repair. Unchanged small low midline ventral hernia containing a  single nonobstructed loop of small bowel (series 2, image 112). No  ascites. Left pelvic sidewall nodule is slightly diminished in size, measuring 1.3 x 1.1 cm, previously 1.6 x 1.5 cm (series 2, image 114). Left paracolic gutter nodule is decreased in size measuring 0.8 x 0.6 cm, previously 1.3 x 1.0 cm (series 2, image 100). Hepatorenal recess nodule is decreased in size measuring 1.6 x 0.6 cm, previously 1.9 x 1.1 cm (series 2, image 61) Musculoskeletal: No acute osseous findings. IMPRESSION: 1. Multiple peritoneal metastatic nodules are slightly diminished in size. 2. A left external iliac lymph node or nodule is however stable to slightly enlarged. 3. Overall findings are most consistent with continued treatment response of metastatic disease. 4. No evidence of metastatic disease within the chest. 5. Hepatic steatosis. Aortic Atherosclerosis (ICD10-I70.0). Electronically Signed   By: Delanna Ahmadi M.D.   On: 06/13/2021 16:46   CT ABDOMEN PELVIS W CONTRAST  Result Date: 06/13/2021 CLINICAL DATA:  Ovarian cancer, assess treatment response EXAM: CT CHEST, ABDOMEN, AND PELVIS WITH CONTRAST TECHNIQUE: Multidetector CT imaging of the chest, abdomen and pelvis was performed following the standard protocol during bolus administration of intravenous contrast. RADIATION DOSE REDUCTION: This exam was performed according to the departmental dose-optimization program which includes automated exposure control, adjustment of the mA and/or kV according to patient size and/or use of iterative reconstruction technique. CONTRAST:  179mL OMNIPAQUE IOHEXOL 300 MG/ML SOLN, additional oral enteric contrast COMPARISON:  CT abdomen pelvis, 01/23/2021, 10/16/2020, CT chest, 12/22/2019 FINDINGS: CT CHEST FINDINGS Cardiovascular: Right chest port catheter. Aortic atherosclerosis. Normal heart size. No pericardial effusion. Mediastinum/Nodes: No enlarged mediastinal, hilar, or axillary lymph nodes. Thyroid gland, trachea, and esophagus  demonstrate no significant findings. Lungs/Pleura: Lungs are clear. No pleural effusion or pneumothorax. Musculoskeletal: No chest wall mass or suspicious osseous lesions identified. CT ABDOMEN PELVIS FINDINGS Hepatobiliary: No solid liver abnormality is seen. Hepatic steatosis. No gallstones, gallbladder wall thickening, or biliary dilatation. Pancreas: Unremarkable. No pancreatic ductal dilatation or surrounding inflammatory changes. Spleen: Normal in size. Subcapsular cystic lesion of the inferior tip of the spleen is unchanged measuring 1.2 x 1.2 cm (series 2, image 60). Adrenals/Urinary Tract: Unchanged left lateral limb adrenal adenoma measuring 1.8 x 1.4 cm (series 2, image 59). Partially exophytic benign simple cyst of the lateral midportion of the left kidney. Kidneys are otherwise normal, without renal calculi, solid lesion, or hydronephrosis. Bladder is unremarkable. Stomach/Bowel: Stomach is within normal limits. Appendix not clearly visualized. No evidence of bowel wall thickening, distention, or inflammatory changes. Vascular/Lymphatic: Aortic atherosclerosis. Left external iliac lymph node is stable to slightly enlarged measuring 1.4 x 1.4 cm, previously 1.3 x 1.2 cm (series 2, image 100). Reproductive: Status post hysterectomy and oophorectomy. Other: Ventral mesh hernia repair. Unchanged small low midline ventral hernia containing a single nonobstructed loop of small bowel (series 2, image 112). No ascites. Left pelvic sidewall nodule is slightly diminished in size, measuring 1.3 x 1.1 cm, previously 1.6 x 1.5 cm (series 2, image 114). Left paracolic gutter nodule is decreased in size measuring 0.8 x 0.6 cm, previously 1.3 x 1.0 cm (series 2, image 100). Hepatorenal recess nodule is decreased in size measuring 1.6 x 0.6 cm, previously 1.9 x 1.1 cm (series 2, image 61) Musculoskeletal: No acute osseous findings. IMPRESSION: 1. Multiple peritoneal metastatic nodules are slightly diminished in size. 2.  A left external iliac lymph node or nodule is however stable to slightly enlarged. 3. Overall findings are most consistent with continued treatment response of metastatic disease. 4. No evidence of metastatic disease within the chest. 5. Hepatic  steatosis. Aortic Atherosclerosis (ICD10-I70.0). Electronically Signed   By: Delanna Ahmadi M.D.   On: 06/13/2021 16:46   VAS [name] UPPER EXTREMITY VENOUS DUPLEX  Result Date: 06/14/2021 UPPER VENOUS STUDY  Patient Name:  MAKAILA WINDLE  Date of Exam:   06/14/2021 Medical Rec #: [id number]     Accession #:    [id number] Date of Birth: [birth year]     Patient Gender: F Patient Age:   65 years Exam Location:  [facility] Procedure:      VAS [name] UPPER EXTREMITY VENOUS DUPLEX Referring Phys: Averi Kilty --------------------------------------------------------------------------------  Indications: Right neck pain and swelling x10 days Comparison Study: No prior study Performing Technologist: Maudry Mayhew MHA, RDMS, RVT, RDCS  Examination Guidelines: A complete evaluation includes B-mode imaging, spectral Doppler, color Doppler, and power Doppler as needed of all accessible portions of each vessel. Bilateral testing is considered an integral part of a complete examination. Limited examinations for reoccurring indications may be performed as noted.  Right Findings: +----------+------------+---------+-----------+-----------------+--------------+  RIGHT      Compressible Phasicity Spontaneous    Properties        Summary      +----------+------------+---------+-----------+-----------------+--------------+  IJV          Partial       Yes        Yes         Partially          Age                                                           occluded      Indeterminate   +----------+------------+---------+-----------+-----------------+--------------+  Subclavian   Partial       Yes        Yes                            Age                                                                          Indeterminate   +----------+------------+---------+-----------+-----------------+--------------+  Axillary       Full        Yes        Yes                                       +----------+------------+---------+-----------+-----------------+--------------+  Brachial       Full        Yes        Yes                                       +----------+------------+---------+-----------+-----------------+--------------+  Radial         Full                                                             +----------+------------+---------+-----------+-----------------+--------------+  Ulnar          Full                                                             +----------+------------+---------+-----------+-----------------+--------------+  Cephalic       Full                                                             +----------+------------+---------+-----------+-----------------+--------------+  Basilic        Full                                                             +----------+------------+---------+-----------+-----------------+--------------+  Left Findings: +----------+------------+---------+-----------+----------+-------+  LEFT       Compressible Phasicity Spontaneous Properties Summary  +----------+------------+---------+-----------+----------+-------+  Subclavian                 Yes        Yes                         +----------+------------+---------+-----------+----------+-------+  Summary:  Right: Findings consistent with 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  Left: No evidence of thrombosis in the subclavian.  *See table(s) above for measurements and observations.    Preliminary

## 2021-06-15 ENCOUNTER — Inpatient Hospital Stay: Payer: 59

## 2021-06-15 ENCOUNTER — Ambulatory Visit (HOSPITAL_COMMUNITY): Payer: 59

## 2021-06-16 ENCOUNTER — Other Ambulatory Visit: Payer: Self-pay

## 2021-06-16 ENCOUNTER — Encounter (HOSPITAL_COMMUNITY): Payer: Self-pay

## 2021-06-16 ENCOUNTER — Ambulatory Visit (HOSPITAL_COMMUNITY)
Admission: EM | Admit: 2021-06-16 | Discharge: 2021-06-16 | Disposition: A | Discharge status: Home / Self Care | Payer: 59 | Attending: Family Medicine | Admitting: Family Medicine

## 2021-06-16 DIAGNOSIS — M542 Cervicalgia: Secondary | ICD-10-CM

## 2021-06-16 MED ORDER — CYCLOBENZAPRINE HCL 5 MG PO TABS: 5.0000 mg | ORAL_TABLET | Freq: Three times a day (TID) | ORAL | 0 refills | Status: DC | PRN

## 2021-06-16 NOTE — ED Triage Notes (Signed)
Pt presents with neck pain that started yesterday.

## 2021-06-17 ENCOUNTER — Encounter: Payer: Self-pay | Admitting: Hematology and Oncology

## 2021-06-18 ENCOUNTER — Other Ambulatory Visit: Payer: Self-pay | Admitting: Hematology and Oncology

## 2021-06-18 ENCOUNTER — Telehealth: Payer: Self-pay

## 2021-06-18 DIAGNOSIS — C569 Malignant neoplasm of unspecified ovary: Secondary | ICD-10-CM

## 2021-06-18 NOTE — Telephone Encounter (Addendum)
Called and given below message. She verbalized understanding.   Given appt for tomorrow at [facility] for port removal, arrive at 10 am, npo, need driver and may may take am meds and needs 24 hour post procedure supervision. [name] verbalized understanding.

## 2021-06-18 NOTE — Telephone Encounter (Signed)
Called per Dr. Alvy Bimler and Deloris Ping message. Dr. Alvy Bimler recommends port removal and replace to the other side.  [name] verbalized understanding and agrees to have port removed and moved to the other side.

## 2021-06-18 NOTE — Telephone Encounter (Signed)
I placed order for removal, let's try chemo a few more weeks with peripheral IV for now

## 2021-06-18 NOTE — ED Provider Notes (Signed)
[facility]   [id number] 06/16/21 Arrival Time: 1211  ASSESSMENT & PLAN:  1. Neck pain on right side    Suspect muscular. Discussed. Is going to contact MD regarding catheter associated thrombosis. On anticoagulation. Normal extremity pulses and sensation.  Begin: Discharge Medication List as of 06/16/2021  3:37 PM     START taking these medications   Details  cyclobenzaprine (FLEXERIL) 5 MG tablet Take 1 tablet (5 mg total) by mouth 3 (three) times daily as needed for muscle spasms., Starting Sat 06/16/2021, Normal        Recommend:  Follow-up Information     [name], MD.   Specialty: Internal Medicine Why: As needed. Contact information: [address] [place] [address] [phone]                  Reviewed expectations re: course of current medical issues. Questions answered. Outlined signs and symptoms indicating need for more acute intervention. Patient verbalized understanding. After Visit Summary given.  SUBJECTIVE: History from: patient. [name] is a 66 y.o. female who reports R-sided neck pain. Onset yesterday; gradual. No extremity sensation changes or weakness. Sore and stiff; esp with certain movements. 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 No coolness of extremities. No extremity pain. No tx PTA.  Past Surgical History:  Procedure Laterality Date   ABDOMINAL HYSTERECTOMY     TAH/BSO   APPENDECTOMY     EXPLORATORY LAPAROTOMY     for bowel obstruction   IR IMAGING GUIDED PORT INSERTION  10/25/2020   left toe surgery Left    Secondary tumor debulking  2002   SHOULDER SURGERY     2017 left   Tumor debulking  2010   VENTRAL HERNIA REPAIR        OBJECTIVE:  Vitals:   06/16/21 1431  BP: 129/81  Pulse: 67  Resp: 16  Temp: (!) 97.3 F (36.3 C)  SpO2: 98%    General appearance: alert; no distress HEENT: [place]; AT Neck: supple with FROM Resp: unlabored  respirations Extremities: Upper ext with normal movement CV: brisk extremity capillary refill of bilateral UE; 2+ radial pulse of bilateral UE. Skin: warm and dry; no visible rashes Neurologic: gait normal; normal sensation and strength of bilateral UE Psychological: alert and cooperative; normal mood and affect   Allergies  Allergen Reactions   Codeine Nausea And Vomiting   Hydrocodone-Acetaminophen Itching   Oxycodone Itching   Thimerosal Other (See Comments)    Eye redness    Tramadol Hcl Itching   Ciprofloxacin Other (See Comments)    FATIGUE   Daypro [Oxaprozin] Rash   Levofloxacin Other (See Comments)    fatigue   Stadol [Butorphanol Tartrate] Other (See Comments)    ALTERED MENTAL STATUS   Sulfa Antibiotics Rash    Past Medical History:  Diagnosis Date   Anxiety    Arthritis    Cervical syndrome    CKD (chronic kidney disease), stage III (HCC)    Constipation    COVID-19 03/26/2019   Family history of colon cancer 02/15/2020   Fatty liver    GERD (gastroesophageal reflux disease)    Granulosa cell tumor of ovary    History of hiatal hernia    Joint pain    Neck pain    Obesity    Osteoarthritis    Ovarian cancer (HCC)    PONV (postoperative nausea and vomiting)    history of n/v,  past surgeries no n/v  Sleep apnea    Tremor    Social History   Socioeconomic History   Marital status: Married    Spouse name: Kay Shippy   Number of children: 2   Years of education: college   Highest education level: Not on file  Occupational History   Occupation: RN  Tobacco Use   Smoking status: Never   Smokeless tobacco: Never  Vaping Use   Vaping Use: Never used  Substance and Sexual Activity   Alcohol use: Yes    Comment: One 8oz glass, socially   Drug use: No   Sexual activity: Not Currently  Other Topics Concern   Not on file  Social History Narrative   Lives at home with husband.   Right-handed.   One cup caffeine daily.   Social Determinants  of Health   Financial Resource Strain: Not on file  Food Insecurity: Not on file  Transportation Needs: Not on file  Physical Activity: Not on file  Stress: Not on file  Social Connections: Not on file   Family History  Problem Relation Age of Onset   Basal cell carcinoma Mother        73s   Thyroid disease Mother    Stroke Father    Colon cancer Father 26   Hypertension Father    Heart disease Father    Colon cancer Paternal Uncle        dx 90s   Colon cancer Cousin        maternal cousin; dx 4s   Past Surgical History:  Procedure Laterality Date   ABDOMINAL HYSTERECTOMY     TAH/BSO   APPENDECTOMY     EXPLORATORY LAPAROTOMY     for bowel obstruction   IR IMAGING GUIDED PORT INSERTION  10/25/2020   left toe surgery Left    Secondary tumor debulking  2002   SHOULDER SURGERY     2017 left   Tumor debulking  2010   [place]         Vanessa Kick, MD 06/18/21 [phone]

## 2021-06-19 ENCOUNTER — Ambulatory Visit (HOSPITAL_COMMUNITY)
Admission: RE | Admit: 2021-06-19 | Discharge: 2021-06-19 | Disposition: A | Discharge status: Home / Self Care | Payer: 59 | Source: Ambulatory Visit | Attending: Hematology and Oncology | Admitting: Hematology and Oncology

## 2021-06-19 ENCOUNTER — Encounter (HOSPITAL_COMMUNITY): Payer: Self-pay

## 2021-06-19 ENCOUNTER — Other Ambulatory Visit: Payer: Self-pay

## 2021-06-19 DIAGNOSIS — T8386XA Thrombosis of genitourinary prosthetic devices, implants and grafts, initial encounter: Secondary | ICD-10-CM | POA: Insufficient documentation

## 2021-06-19 DIAGNOSIS — Y831 Surgical operation with implant of artificial internal device as the cause of abnormal reaction of the patient, or of later complication, without mention of misadventure at the time of the procedure: Secondary | ICD-10-CM | POA: Insufficient documentation

## 2021-06-19 DIAGNOSIS — C569 Malignant neoplasm of unspecified ovary: Secondary | ICD-10-CM | POA: Diagnosis not present

## 2021-06-19 DIAGNOSIS — Z452 Encounter for adjustment and management of vascular access device: Secondary | ICD-10-CM | POA: Diagnosis not present

## 2021-06-19 HISTORY — PX: IR REMOVAL TUN ACCESS W/ PORT W/O FL MOD SED: IMG2290

## 2021-06-19 MED ORDER — MIDAZOLAM HCL 2 MG/2ML IJ SOLN
INTRAMUSCULAR | Status: AC | PRN
  Administered 2021-06-19: 1 mg via INTRAVENOUS

## 2021-06-19 MED ORDER — MIDAZOLAM HCL 2 MG/2ML IJ SOLN
INTRAMUSCULAR | Status: AC
  Filled 2021-06-19: qty 2

## 2021-06-19 MED ORDER — LIDOCAINE HCL 1 % IJ SOLN
INTRAMUSCULAR | Status: AC
  Filled 2021-06-19: qty 20

## 2021-06-19 MED ORDER — FENTANYL CITRATE (PF) 100 MCG/2ML IJ SOLN
INTRAMUSCULAR | Status: AC | PRN
  Administered 2021-06-19: 25 ug via INTRAVENOUS

## 2021-06-19 MED ORDER — SODIUM CHLORIDE 0.9 % IV SOLN: INTRAVENOUS | Status: DC

## 2021-06-19 MED ORDER — FENTANYL CITRATE (PF) 100 MCG/2ML IJ SOLN
INTRAMUSCULAR | Status: AC
  Filled 2021-06-19: qty 2

## 2021-06-19 NOTE — Progress Notes (Signed)
Received verbal orders from Jannifer Franklin to place one IV with no labs needed. One IV placed for procedure.

## 2021-06-19 NOTE — Procedures (Signed)
Interventional Radiology Procedure Note  Procedure: Chest Port Removal  Indication: DVT. Right neck Pain.  Findings: Please refer to procedural dictation for full description.  Complications: None  EBL: < 10 mL  Miachel Roux, MD [phone]

## 2021-06-19 NOTE — Sedation Documentation (Signed)
Pt states normal heart rate is bradycardic, less than 60, and as low as 43bpm

## 2021-06-19 NOTE — H&P (Signed)
Chief Complaint: Patient was seen in consultation today for [facility] a cath removal at the request of [place]  Referring Physician(s): [name]  Supervising Physician: [name], Biochemist, clinical  Patient Status: [name] - Out-pt  History of Present Illness: [name] is a 66 y.o. 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 Left: No evidence of thrombosis in the subclavian.  Now on Xarelto - started 06/14/21 Scheduled now for removal per Dr [name] Pt states plan is to use peripheral IV for now  Past Medical History:  Diagnosis Date   Anxiety    Arthritis    Cervical syndrome    CKD (chronic kidney disease), stage III ([place])    Constipation    COVID-19 03/26/2019   Family history of colon cancer 02/15/2020   Fatty liver    GERD (gastroesophageal reflux disease)    Granulosa cell tumor of ovary    History of hiatal hernia    Joint pain    Neck pain    Obesity    Osteoarthritis    Ovarian cancer ([place])    PONV (postoperative nausea and vomiting)    history of n/v,  past surgeries no n/v   Sleep apnea    Tremor     Past Surgical History:  Procedure Laterality Date   ABDOMINAL HYSTERECTOMY     TAH/BSO   APPENDECTOMY     EXPLORATORY LAPAROTOMY     for bowel obstruction   IR IMAGING GUIDED PORT INSERTION  10/25/2020   left toe surgery Left    Secondary tumor debulking  2002   SHOULDER SURGERY     2017 left   Tumor debulking  2010   VENTRAL HERNIA REPAIR      Allergies: Codeine, Hydrocodone-acetaminophen, Oxycodone, Thimerosal, Tramadol hcl, Ciprofloxacin, Daypro [oxaprozin], Levofloxacin, Stadol [butorphanol tartrate], and Sulfa antibiotics  Medications: Prior to Admission medications    Medication Sig Start Date End Date Taking? Authorizing Provider  acetaminophen (TYLENOL) 500 MG tablet Take 1,000 mg by mouth every 6 (six) hours as needed for moderate pain.   Yes [provider]  Biotin 5 MG TABS Take 5 mg by mouth daily.   Yes [provider]  buPROPion (WELLBUTRIN SR) 200 MG 12 hr tablet Take 1 tablet by mouth 2 times daily. (need office visit for refills) 06/11/21 06/11/22 Yes Opalski, Neoma Laming, DO  Ca Phosphate-Cholecalciferol (EQL CALCIUM GUMMIES) 250-400 MG-UNIT CHEW Chew 1 tablet by mouth 2 (two) times daily.   Yes [provider]  cyclobenzaprine (FLEXERIL) 5 MG tablet Take 1 tablet (5 mg total) by mouth 3 (three) times daily as needed for muscle spasms. 06/16/21  Yes Hagler, Aaron Edelman, MD  escitalopram (LEXAPRO) 20 MG tablet TAKE 1 TABLET BY MOUTH ONCE A DAY 07/03/20 07/25/21 Yes [name], MD  gabapentin (NEURONTIN) 300 MG capsule Take 1 capsule (300 mg total) by mouth 2 (two) times daily. 03/06/21  Yes [name], Ni, MD  lidocaine-prilocaine (EMLA) cream Apply topically as needed. 04/10/21  Yes Jacqueline Lark, MD  metFORMIN (GLUCOPHAGE) 500 MG tablet Take 1 tablet by mouth every morning and 2 tablets every evening as directed (need office visit for refills) 06/11/21  Yes Opalski, Neoma Laming, DO  mirabegron ER (MYRBETRIQ) 50 MG TB24 tablet Take  1 tablet by mouth once daily 12/11/20  Yes   mupirocin ointment (BACTROBAN) 2 % Apply to the inside of each nostril twice daily for five (5) days. After application, press sides of nose together and gently massage. Patient taking differently: Place 1 application into the nose 2 (two) times daily as needed (nose sores). 01/30/21  Yes Rozetta Nunnery, MD  omeprazole (PRILOSEC) 20 MG capsule Take 1 capsule by mouth once a day 01/12/21  Yes   ondansetron (ZOFRAN) 8 MG tablet Take 1 tablet (8 mg total) by mouth every 8 (eight) hours as needed for nausea or vomiting. 11/01/20  Yes Jacqueline Lark, MD  Polyethyl Glycol-Propyl  Glycol (SYSTANE OP) Place 1 drop into both eyes daily as needed (dry eyes).   Yes [provider]  Prenatal MV & Min w/FA-DHA (PRENATAL GUMMIES) 0.18-25 MG CHEW Chew 1 capsule by mouth daily. 12/26/20  Yes [provider]  Probiotic Product (ALIGN PO) Take 1 tablet by mouth daily.    Yes [provider]  RIVAROXABAN Alveda Reasons) VTE STARTER PACK (15 & 20 MG) Follow package directions: Take one 15mg  tablet by mouth twice a day. On day 22, switch to one 20mg  tablet once a day. Take with food. 06/14/21  Yes [name], Ni, MD  HYDROmorphone (DILAUDID) 2 MG tablet Take 1 tablet by mouth every 4 (four) hours as needed for severe pain. 05/25/21   Jacqueline Lark, MD  LORazepam (ATIVAN) 0.5 MG tablet Take 1 tablet (0.5 mg total) by mouth 2 (two) times daily as needed for anxiety. 11/01/20   Jacqueline Lark, MD  prochlorperazine (COMPAZINE) 10 MG tablet Take 1 tablet by mouth every 6 hours as needed for nausea or vomiting. 03/13/21   Jacqueline Lark, MD     Family History  Problem Relation Age of Onset   Basal cell carcinoma Mother        52s   Thyroid disease Mother    Stroke Father    Colon cancer Father 16   Hypertension Father    Heart disease Father    Colon cancer Paternal Uncle        dx 37s   Colon cancer Cousin        maternal cousin; dx 37s    Social History   Socioeconomic History   Marital status: Married    Spouse name: [name]   Number of children: 2   Years of education: college   Highest education level: Not on file  Occupational History   Occupation: Therapist, sports  Tobacco Use   Smoking status: Never   Smokeless tobacco: Never  Vaping Use   Vaping Use: Never used  Substance and Sexual Activity   Alcohol use: Yes    Comment: One 8oz glass, socially   Drug use: No   Sexual activity: Not Currently  Other Topics Concern   Not on file  Social History Narrative   Lives at home with husband.   Right-handed.   One cup caffeine daily.   Social Determinants of Health    Financial Resource Strain: Not on file  Food Insecurity: Not on file  Transportation Needs: Not on file  Physical Activity: Not on file  Stress: Not on file  Social Connections: Not on file    Review of Systems: A 12 point ROS discussed and pertinent positives are indicated in the HPI above.  All other systems are negative.  Review of Systems  Constitutional:  Negative for activity change and fatigue.  Respiratory:  Negative for cough  and shortness of breath.   Psychiatric/Behavioral:  Negative for behavioral problems and confusion.    Vital Signs: BP 118/60    Pulse 62    Temp 97.8 F (36.6 C) (Oral)    Resp 17    Ht 5\' 6"  (1.676 m)    Wt 195 lb (88.5 kg)    SpO2 96%    BMI 31.47 kg/m   Physical Exam Vitals reviewed.  HENT:     Mouth/Throat:     Mouth: Mucous membranes are moist.  Cardiovascular:     Rate and Rhythm: Normal rate and regular rhythm.     Heart sounds: Normal heart sounds.  Pulmonary:     Effort: Pulmonary effort is normal.     Breath sounds: Normal breath sounds.  Abdominal:     Palpations: Abdomen is soft.  Musculoskeletal:        General: Normal range of motion.  Neurological:     Mental Status: She is alert and oriented to person, place, and time.  Psychiatric:        Behavior: Behavior normal.    Imaging: CT CHEST W CONTRAST  Result Date: 06/13/2021 CLINICAL DATA:  Ovarian cancer, assess treatment response EXAM: CT CHEST, ABDOMEN, AND PELVIS WITH CONTRAST TECHNIQUE: Multidetector CT imaging of the chest, abdomen and pelvis was performed following the standard protocol during bolus administration of intravenous contrast. RADIATION DOSE REDUCTION: This exam was performed according to the departmental dose-optimization program which includes automated exposure control, adjustment of the mA and/or kV according to patient size and/or use of iterative reconstruction technique. CONTRAST:  135mL OMNIPAQUE IOHEXOL 300 MG/ML SOLN, additional oral enteric  contrast COMPARISON:  CT abdomen pelvis, 01/23/2021, 10/16/2020, CT chest, 12/22/2019 FINDINGS: CT CHEST FINDINGS Cardiovascular: Right chest port catheter. Aortic atherosclerosis. Normal heart size. No pericardial effusion. Mediastinum/Nodes: No enlarged mediastinal, hilar, or axillary lymph nodes. Thyroid gland, trachea, and esophagus demonstrate no significant findings. Lungs/Pleura: Lungs are clear. No pleural effusion or pneumothorax. Musculoskeletal: No chest wall mass or suspicious osseous lesions identified. CT ABDOMEN PELVIS FINDINGS Hepatobiliary: No solid liver abnormality is seen. Hepatic steatosis. No gallstones, gallbladder wall thickening, or biliary dilatation. Pancreas: Unremarkable. No pancreatic ductal dilatation or surrounding inflammatory changes. Spleen: Normal in size. Subcapsular cystic lesion of the inferior tip of the spleen is unchanged measuring 1.2 x 1.2 cm (series 2, image 60). Adrenals/Urinary Tract: Unchanged left lateral limb adrenal adenoma measuring 1.8 x 1.4 cm (series 2, image 59). Partially exophytic benign simple cyst of the lateral midportion of the left kidney. Kidneys are otherwise normal, without renal calculi, solid lesion, or hydronephrosis. Bladder is unremarkable. Stomach/Bowel: Stomach is within normal limits. Appendix not clearly visualized. No evidence of bowel wall thickening, distention, or inflammatory changes. Vascular/Lymphatic: Aortic atherosclerosis. Left external iliac lymph node is stable to slightly enlarged measuring 1.4 x 1.4 cm, previously 1.3 x 1.2 cm (series 2, image 100). Reproductive: Status post hysterectomy and oophorectomy. Other: Ventral mesh hernia repair. Unchanged small low midline ventral hernia containing a single nonobstructed loop of small bowel (series 2, image 112). No ascites. Left pelvic sidewall nodule is slightly diminished in size, measuring 1.3 x 1.1 cm, previously 1.6 x 1.5 cm (series 2, image 114). Left paracolic gutter nodule is  decreased in size measuring 0.8 x 0.6 cm, previously 1.3 x 1.0 cm (series 2, image 100). Hepatorenal recess nodule is decreased in size measuring 1.6 x 0.6 cm, previously 1.9 x 1.1 cm (series 2, image 61) Musculoskeletal: No acute osseous findings. IMPRESSION: 1. Multiple  peritoneal metastatic nodules are slightly diminished in size. 2. A left external iliac lymph node or nodule is however stable to slightly enlarged. 3. Overall findings are most consistent with continued treatment response of metastatic disease. 4. No evidence of metastatic disease within the chest. 5. Hepatic steatosis. Aortic Atherosclerosis (ICD10-I70.0). Electronically Signed   By: Delanna Ahmadi M.D.   On: 06/13/2021 16:46   CT ABDOMEN PELVIS W CONTRAST  Result Date: 06/13/2021 CLINICAL DATA:  Ovarian cancer, assess treatment response EXAM: CT CHEST, ABDOMEN, AND PELVIS WITH CONTRAST TECHNIQUE: Multidetector CT imaging of the chest, abdomen and pelvis was performed following the standard protocol during bolus administration of intravenous contrast. RADIATION DOSE REDUCTION: This exam was performed according to the departmental dose-optimization program which includes automated exposure control, adjustment of the mA and/or kV according to patient size and/or use of iterative reconstruction technique. CONTRAST:  121mL OMNIPAQUE IOHEXOL 300 MG/ML SOLN, additional oral enteric contrast COMPARISON:  CT abdomen pelvis, 01/23/2021, 10/16/2020, CT chest, 12/22/2019 FINDINGS: CT CHEST FINDINGS Cardiovascular: Right chest port catheter. Aortic atherosclerosis. Normal heart size. No pericardial effusion. Mediastinum/Nodes: No enlarged mediastinal, hilar, or axillary lymph nodes. Thyroid gland, trachea, and esophagus demonstrate no significant findings. Lungs/Pleura: Lungs are clear. No pleural effusion or pneumothorax. Musculoskeletal: No chest wall mass or suspicious osseous lesions identified. CT ABDOMEN PELVIS FINDINGS Hepatobiliary: No solid liver  abnormality is seen. Hepatic steatosis. No gallstones, gallbladder wall thickening, or biliary dilatation. Pancreas: Unremarkable. No pancreatic ductal dilatation or surrounding inflammatory changes. Spleen: Normal in size. Subcapsular cystic lesion of the inferior tip of the spleen is unchanged measuring 1.2 x 1.2 cm (series 2, image 60). Adrenals/Urinary Tract: Unchanged left lateral limb adrenal adenoma measuring 1.8 x 1.4 cm (series 2, image 59). Partially exophytic benign simple cyst of the lateral midportion of the left kidney. Kidneys are otherwise normal, without renal calculi, solid lesion, or hydronephrosis. Bladder is unremarkable. Stomach/Bowel: Stomach is within normal limits. Appendix not clearly visualized. No evidence of bowel wall thickening, distention, or inflammatory changes. Vascular/Lymphatic: Aortic atherosclerosis. Left external iliac lymph node is stable to slightly enlarged measuring 1.4 x 1.4 cm, previously 1.3 x 1.2 cm (series 2, image 100). Reproductive: Status post hysterectomy and oophorectomy. Other: Ventral mesh hernia repair. Unchanged small low midline ventral hernia containing a single nonobstructed loop of small bowel (series 2, image 112). No ascites. Left pelvic sidewall nodule is slightly diminished in size, measuring 1.3 x 1.1 cm, previously 1.6 x 1.5 cm (series 2, image 114). Left paracolic gutter nodule is decreased in size measuring 0.8 x 0.6 cm, previously 1.3 x 1.0 cm (series 2, image 100). Hepatorenal recess nodule is decreased in size measuring 1.6 x 0.6 cm, previously 1.9 x 1.1 cm (series 2, image 61) Musculoskeletal: No acute osseous findings. IMPRESSION: 1. Multiple peritoneal metastatic nodules are slightly diminished in size. 2. A left external iliac lymph node or nodule is however stable to slightly enlarged. 3. Overall findings are most consistent with continued treatment response of metastatic disease. 4. No evidence of metastatic disease within the chest. 5.  Hepatic steatosis. Aortic Atherosclerosis (ICD10-I70.0). Electronically Signed   By: Delanna Ahmadi M.D.   On: 06/13/2021 16:46   VAS [name] UPPER EXTREMITY VENOUS DUPLEX  Result Date: 06/14/2021 UPPER VENOUS STUDY  Patient Name:  [name]  Date of Exam:   06/14/2021 Medical Rec #: [id number]     Accession #:    [id number] Date of Birth: 06-12-1955     Patient Gender: F Patient Age:  65 years Exam Location:  [facility] Procedure:      VAS [name] UPPER EXTREMITY VENOUS DUPLEX Referring Phys: NI [name] --------------------------------------------------------------------------------  Indications: Right neck pain and swelling x10 days Comparison Study: No prior study Performing Technologist: Maudry Mayhew MHA, RDMS, RVT, RDCS  Examination Guidelines: A complete evaluation includes B-mode imaging, spectral Doppler, color Doppler, and power Doppler as needed of all accessible portions of each vessel. Bilateral testing is considered an integral part of a complete examination. Limited examinations for reoccurring indications may be performed as noted.  Right Findings: +----------+------------+---------+-----------+-----------------+--------------+  RIGHT      Compressible Phasicity Spontaneous    Properties        Summary      +----------+------------+---------+-----------+-----------------+--------------+  IJV          Partial       Yes        Yes         Partially          Age                                                           occluded      Indeterminate   +----------+------------+---------+-----------+-----------------+--------------+  Subclavian   Partial       Yes        Yes                            Age                                                                         Indeterminate   +----------+------------+---------+-----------+-----------------+--------------+  Axillary       Full        Yes        Yes                                        +----------+------------+---------+-----------+-----------------+--------------+  Brachial       Full        Yes        Yes                                       +----------+------------+---------+-----------+-----------------+--------------+  Radial         Full                                                             +----------+------------+---------+-----------+-----------------+--------------+  Ulnar          Full                                                             +----------+------------+---------+-----------+-----------------+--------------+  Cephalic       Full                                                             +----------+------------+---------+-----------+-----------------+--------------+  Basilic        Full                                                             +----------+------------+---------+-----------+-----------------+--------------+  Left Findings: +----------+------------+---------+-----------+----------+-------+  LEFT       Compressible Phasicity Spontaneous Properties Summary  +----------+------------+---------+-----------+----------+-------+  Subclavian                 Yes        Yes                         +----------+------------+---------+-----------+----------+-------+  Summary:  Right: Findings consistent with 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  Left: No evidence of thrombosis in the subclavian.  *See table(s) above for measurements and observations.  Diagnosing physician: Orlie Pollen Electronically signed by Orlie Pollen on 06/14/2021 at 6:07:48 PM.    Final     Labs:  CBC: Recent Labs    05/04/21 0825 05/07/21 1304 05/25/21 0831 06/11/21 1335  WBC 5.2 5.3 5.1 5.7  HGB 13.3 13.0 13.1 12.9  HCT 39.3 38.7 39.5 39.0  PLT 317 284 310 281    COAGS: No results for input(s): INR, APTT in the last 8760 hours.  BMP: Recent Labs    05/04/21 0825 05/07/21 1304 05/25/21 0852  06/11/21 1335  NA 138 136 136 139  K 4.3 4.5 4.9 4.3  CL 104 102 102 103  CO2 26 26 28 30   GLUCOSE 90 88 104* 95  BUN 23 33* 23 25*  CALCIUM 9.4 9.4 9.6 9.6  CREATININE 1.14* 1.17* 1.17* 1.15*  GFRNONAA 54* 52* 52* 53*    LIVER FUNCTION TESTS: Recent Labs    05/04/21 0825 05/07/21 1304 05/25/21 0852 06/11/21 1335  BILITOT 0.5 0.5 0.4 0.4  AST 14* 19 14* 18  ALT 14 16 14 17   ALKPHOS 65 62 69 66  PROT 6.9 7.0 6.8 6.7  ALBUMIN 4.0 4.1 4.1 4.1    TUMOR MARKERS: No results for input(s): AFPTM, CEA, CA199, CHROMGRNA in the last 8760 hours.  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 Now on Xarelto For removal of PAC today per Dr [name] Pt is aware of procedure benefits and risks including but not limited to Infection; vessel damage; bleeding Agreeable to proceed Consent signed and in chart    Thank you for this interesting consult.  I greatly enjoyed meeting PHIL CORTI and look forward to participating in their care.  A copy of this report was sent to the requesting provider on this date.  Electronically Signed: Lavonia Drafts, PA-C 06/19/2021, 10:32 AM   I spent a total of  25 Minutes in face to face in clinical consultation, greater than 50% of which was counseling/coordinating care for removal PAC

## 2021-06-20 ENCOUNTER — Encounter: Payer: Self-pay | Admitting: Hematology and Oncology

## 2021-06-20 ENCOUNTER — Inpatient Hospital Stay: Payer: 59 | Attending: Hematology

## 2021-06-20 DIAGNOSIS — C569 Malignant neoplasm of unspecified ovary: Secondary | ICD-10-CM | POA: Insufficient documentation

## 2021-06-20 DIAGNOSIS — Z5111 Encounter for antineoplastic chemotherapy: Secondary | ICD-10-CM | POA: Diagnosis not present

## 2021-06-20 DIAGNOSIS — C786 Secondary malignant neoplasm of retroperitoneum and peritoneum: Secondary | ICD-10-CM | POA: Insufficient documentation

## 2021-06-20 LAB — CBC WITH DIFFERENTIAL/PLATELET
Abs Immature Granulocytes: 0.01 10*3/uL (ref 0.00–0.07)
Basophils Absolute: 0.1 10*3/uL (ref 0.0–0.1)
Basophils Relative: 1 %
Eosinophils Absolute: 0.5 10*3/uL (ref 0.0–0.5)
Eosinophils Relative: 8 %
HCT: 40.2 % (ref 36.0–46.0)
Hemoglobin: 13.5 g/dL (ref 12.0–15.0)
Immature Granulocytes: 0 %
Lymphocytes Relative: 32 %
Lymphs Abs: 2.1 10*3/uL (ref 0.7–4.0)
MCH: 31.4 pg (ref 26.0–34.0)
MCHC: 33.6 g/dL (ref 30.0–36.0)
MCV: 93.5 fL (ref 80.0–100.0)
Monocytes Absolute: 0.5 10*3/uL (ref 0.1–1.0)
Monocytes Relative: 8 %
Neutro Abs: 3.4 10*3/uL (ref 1.7–7.7)
Neutrophils Relative %: 51 %
Platelets: 283 10*3/uL (ref 150–400)
RBC: 4.3 MIL/uL (ref 3.87–5.11)
RDW: 13.2 % (ref 11.5–15.5)
WBC: 6.6 10*3/uL (ref 4.0–10.5)
nRBC: 0 % (ref 0.0–0.2)

## 2021-06-20 LAB — CMP (CANCER CENTER ONLY)
ALT: 19 U/L (ref 0–44)
AST: 21 U/L (ref 15–41)
Albumin: 3.9 g/dL (ref 3.5–5.0)
Alkaline Phosphatase: 66 U/L (ref 38–126)
Anion gap: 6 (ref 5–15)
BUN: 20 mg/dL (ref 8–23)
CO2: 28 mmol/L (ref 22–32)
Calcium: 8.9 mg/dL (ref 8.9–10.3)
Chloride: 101 mmol/L (ref 98–111)
Creatinine: 1.19 mg/dL — ABNORMAL HIGH (ref 0.44–1.00)
GFR, Estimated: 51 mL/min — ABNORMAL LOW (ref >60)
Glucose, Bld: 111 mg/dL — ABNORMAL HIGH (ref 70–99)
Potassium: 4.2 mmol/L (ref 3.5–5.1)
Sodium: 135 mmol/L (ref 135–145)
Total Bilirubin: 0.4 mg/dL (ref 0.3–1.2)
Total Protein: 6.8 g/dL (ref 6.5–8.1)

## 2021-06-21 ENCOUNTER — Inpatient Hospital Stay: Payer: 59

## 2021-06-22 ENCOUNTER — Inpatient Hospital Stay: Payer: 59

## 2021-06-22 ENCOUNTER — Other Ambulatory Visit: Payer: Self-pay

## 2021-06-22 VITALS — BP 126/68 | HR 64 | Temp 98.2°F | Resp 18

## 2021-06-22 DIAGNOSIS — C786 Secondary malignant neoplasm of retroperitoneum and peritoneum: Secondary | ICD-10-CM | POA: Diagnosis not present

## 2021-06-22 DIAGNOSIS — C569 Malignant neoplasm of unspecified ovary: Secondary | ICD-10-CM | POA: Diagnosis not present

## 2021-06-22 DIAGNOSIS — Z5111 Encounter for antineoplastic chemotherapy: Secondary | ICD-10-CM | POA: Diagnosis not present

## 2021-06-22 DIAGNOSIS — Z7189 Other specified counseling: Secondary | ICD-10-CM

## 2021-06-22 MED ORDER — DIPHENHYDRAMINE HCL 50 MG/ML IJ SOLN
25.0000 mg | Freq: Once | INTRAMUSCULAR | Status: AC
  Administered 2021-06-22: 25 mg via INTRAVENOUS
  Filled 2021-06-22: qty 1

## 2021-06-22 MED ORDER — SODIUM CHLORIDE 0.9 % IV SOLN: Freq: Once | INTRAVENOUS | Status: AC

## 2021-06-22 MED ORDER — DEXAMETHASONE SODIUM PHOSPHATE 10 MG/ML IJ SOLN
5.0000 mg | Freq: Once | INTRAMUSCULAR | Status: AC
  Administered 2021-06-22: 5 mg via INTRAVENOUS
  Filled 2021-06-22: qty 1

## 2021-06-22 MED ORDER — SODIUM CHLORIDE 0.9 % IV SOLN
60.0000 mg/m2 | Freq: Once | INTRAVENOUS | Status: AC
  Administered 2021-06-22: 120 mg via INTRAVENOUS
  Filled 2021-06-22: qty 20

## 2021-06-22 MED ORDER — FAMOTIDINE IN NACL 20-0.9 MG/50ML-% IV SOLN
20.0000 mg | Freq: Once | INTRAVENOUS | Status: AC
  Administered 2021-06-22: 20 mg via INTRAVENOUS
  Filled 2021-06-22: qty 50

## 2021-06-22 NOTE — Patient Instructions (Signed)
[facility] MEDICAL [facility]COLOGY   Discharge Instructions: Thank you for choosing [facility] to provide your oncology and hematology care.   If you have a lab appointment with the Cancer Center, please go directly to the Cancer Center and check in at the registration area.   Wear comfortable clothing and clothing appropriate for easy access to any Portacath or PICC line.   We strive to give you quality time with your provider. You may need to reschedule your appointment if you arrive late (15 or more minutes).  Arriving late affects you and other patients whose appointments are after yours.  Also, if you miss three or more appointments without notifying the office, you may be dismissed from the clinic at the provider's discretion.      For prescription refill requests, have your pharmacy contact our office and allow 72 hours for refills to be completed.    Today you received the following chemotherapy and/or immunotherapy agents: paclitaxel.      To help prevent nausea and vomiting after your treatment, we encourage you to take your nausea medication as directed.  BELOW ARE SYMPTOMS THAT SHOULD BE REPORTED IMMEDIATELY: *FEVER GREATER THAN 100.4 F (38 C) OR HIGHER *CHILLS OR SWEATING *NAUSEA AND VOMITING THAT IS NOT CONTROLLED WITH YOUR NAUSEA MEDICATION *UNUSUAL SHORTNESS OF BREATH *UNUSUAL BRUISING OR BLEEDING *URINARY PROBLEMS (pain or burning when urinating, or frequent urination) *BOWEL PROBLEMS (unusual diarrhea, constipation, pain near the anus) TENDERNESS IN MOUTH AND THROAT WITH OR WITHOUT PRESENCE OF ULCERS (sore throat, sores in mouth, or a toothache) UNUSUAL RASH, SWELLING OR PAIN  UNUSUAL VAGINAL DISCHARGE OR ITCHING   Items with * indicate a potential emergency and should be followed up as soon as possible or go to the Emergency Department if any problems should occur.  Please show the CHEMOTHERAPY ALERT CARD or IMMUNOTHERAPY ALERT CARD at check-in  to the Emergency Department and triage nurse.  Should you have questions after your visit or need to cancel or reschedule your appointment, please contact [facility]  Dept: [phone]  and follow the prompts.  Office hours are 8:00 a.m. to 4:30 p.m. Monday - Friday. Please note that voicemails left after 4:00 p.m. may not be returned until the following business day.  We are closed weekends and major holidays. You have access to a nurse at all times for urgent questions. Please call the main number to the clinic Dept: [phone] and follow the prompts.   For any non-urgent questions, you may also contact your provider using MyChart. We now offer e-Visits for anyone 18 and older to request care online for non-urgent symptoms. For details visit [email or web address].   Also download the MyChart app! Go to the app store, search "MyChart", open the app, select , and log in with your MyChart username and password.  Due to Covid, a mask is required upon entering the hospital/clinic. If you do not have a mask, one will be given to you upon arrival. For doctor visits, patients may have 1 support person aged 18 or older with them. For treatment visits, patients cannot have anyone with them due to current Covid guidelines and our immunocompromised population.   

## 2021-06-27 ENCOUNTER — Other Ambulatory Visit: Payer: Self-pay

## 2021-06-27 ENCOUNTER — Inpatient Hospital Stay (HOSPITAL_BASED_OUTPATIENT_CLINIC_OR_DEPARTMENT_OTHER): Payer: 59

## 2021-06-27 DIAGNOSIS — C786 Secondary malignant neoplasm of retroperitoneum and peritoneum: Secondary | ICD-10-CM | POA: Diagnosis not present

## 2021-06-27 DIAGNOSIS — C569 Malignant neoplasm of unspecified ovary: Secondary | ICD-10-CM

## 2021-06-27 DIAGNOSIS — Z5111 Encounter for antineoplastic chemotherapy: Secondary | ICD-10-CM | POA: Diagnosis not present

## 2021-06-27 LAB — COMPREHENSIVE METABOLIC PANEL
ALT: 21 U/L (ref 0–44)
AST: 22 U/L (ref 15–41)
Albumin: 3.9 g/dL (ref 3.5–5.0)
Alkaline Phosphatase: 66 U/L (ref 38–126)
Anion gap: 6 (ref 5–15)
BUN: 28 mg/dL — ABNORMAL HIGH (ref 8–23)
CO2: 28 mmol/L (ref 22–32)
Calcium: 9 mg/dL (ref 8.9–10.3)
Chloride: 100 mmol/L (ref 98–111)
Creatinine, Ser: 1.08 mg/dL — ABNORMAL HIGH (ref 0.44–1.00)
GFR, Estimated: 57 mL/min — ABNORMAL LOW (ref >60)
Glucose, Bld: 109 mg/dL — ABNORMAL HIGH (ref 70–99)
Potassium: 4.2 mmol/L (ref 3.5–5.1)
Sodium: 134 mmol/L — ABNORMAL LOW (ref 135–145)
Total Bilirubin: 0.3 mg/dL (ref 0.3–1.2)
Total Protein: 7 g/dL (ref 6.5–8.1)

## 2021-06-27 LAB — CBC WITH DIFFERENTIAL/PLATELET
Abs Immature Granulocytes: 0.01 10*3/uL (ref 0.00–0.07)
Basophils Absolute: 0.1 10*3/uL (ref 0.0–0.1)
Basophils Relative: 1 %
Eosinophils Absolute: 0.4 10*3/uL (ref 0.0–0.5)
Eosinophils Relative: 8 %
HCT: 39.2 % (ref 36.0–46.0)
Hemoglobin: 13.1 g/dL (ref 12.0–15.0)
Immature Granulocytes: 0 %
Lymphocytes Relative: 36 %
Lymphs Abs: 2 10*3/uL (ref 0.7–4.0)
MCH: 31.3 pg (ref 26.0–34.0)
MCHC: 33.4 g/dL (ref 30.0–36.0)
MCV: 93.8 fL (ref 80.0–100.0)
Monocytes Absolute: 0.3 10*3/uL (ref 0.1–1.0)
Monocytes Relative: 6 %
Neutro Abs: 2.8 10*3/uL (ref 1.7–7.7)
Neutrophils Relative %: 49 %
Platelets: 259 10*3/uL (ref 150–400)
RBC: 4.18 MIL/uL (ref 3.87–5.11)
RDW: 13 % (ref 11.5–15.5)
WBC: 5.7 10*3/uL (ref 4.0–10.5)
nRBC: 0 % (ref 0.0–0.2)

## 2021-06-29 ENCOUNTER — Telehealth: Payer: Self-pay

## 2021-06-29 ENCOUNTER — Inpatient Hospital Stay: Payer: 59

## 2021-06-29 ENCOUNTER — Other Ambulatory Visit (HOSPITAL_COMMUNITY): Payer: Self-pay

## 2021-06-29 ENCOUNTER — Other Ambulatory Visit: Payer: Self-pay

## 2021-06-29 VITALS — BP 126/71 | HR 73 | Temp 98.3°F | Wt 193.2 lb

## 2021-06-29 DIAGNOSIS — C569 Malignant neoplasm of unspecified ovary: Secondary | ICD-10-CM

## 2021-06-29 DIAGNOSIS — Z5111 Encounter for antineoplastic chemotherapy: Secondary | ICD-10-CM | POA: Diagnosis not present

## 2021-06-29 DIAGNOSIS — C786 Secondary malignant neoplasm of retroperitoneum and peritoneum: Secondary | ICD-10-CM | POA: Diagnosis not present

## 2021-06-29 DIAGNOSIS — Z7189 Other specified counseling: Secondary | ICD-10-CM

## 2021-06-29 MED ORDER — DIPHENHYDRAMINE HCL 50 MG/ML IJ SOLN
25.0000 mg | Freq: Once | INTRAMUSCULAR | Status: AC
  Administered 2021-06-29: 25 mg via INTRAVENOUS
  Filled 2021-06-29: qty 1

## 2021-06-29 MED ORDER — FAMOTIDINE IN NACL 20-0.9 MG/50ML-% IV SOLN
20.0000 mg | Freq: Once | INTRAVENOUS | Status: AC
  Administered 2021-06-29: 20 mg via INTRAVENOUS
  Filled 2021-06-29: qty 50

## 2021-06-29 MED ORDER — DEXAMETHASONE SODIUM PHOSPHATE 10 MG/ML IJ SOLN
5.0000 mg | Freq: Once | INTRAMUSCULAR | Status: AC
  Administered 2021-06-29: 5 mg via INTRAVENOUS
  Filled 2021-06-29: qty 1

## 2021-06-29 MED ORDER — SODIUM CHLORIDE 0.9 % IV SOLN: Freq: Once | INTRAVENOUS | Status: AC

## 2021-06-29 MED ORDER — SODIUM CHLORIDE 0.9 % IV SOLN
60.0000 mg/m2 | Freq: Once | INTRAVENOUS | Status: AC
  Administered 2021-06-29: 120 mg via INTRAVENOUS
  Filled 2021-06-29: qty 20

## 2021-06-29 NOTE — Telephone Encounter (Signed)
Returned her call. She is complaining of a tender knot area to the right side of the back of her skull. She is taking blood thinners as prescribed. Scheduled appt for Monday at 1020, she will call back to cancel if appt no longer needed. She will take tylenol prn and go to the ER for worsening symptoms. ?

## 2021-06-29 NOTE — Patient Instructions (Addendum)
[facility]  Discharge Instructions: Thank you for choosing [facility] to provide your oncology and hematology care.   If you have a lab appointment with the [facility], please go directly to the [facility] and check in at the registration area.   Wear comfortable clothing and clothing appropriate for easy access to any Portacath or PICC line.   We strive to give you quality time with your provider. You may need to reschedule your appointment if you arrive late (15 or more minutes).  Arriving late affects you and other patients whose appointments are after yours.  Also, if you miss three or more appointments without notifying the office, you may be dismissed from the clinic at the providers discretion.      For prescription refill requests, have your pharmacy contact our office and allow 72 hours for refills to be completed.    Today you received the following chemotherapy and/or immunotherapy agent: Paclitaxel (Taxol)   To help prevent nausea and vomiting after your treatment, we encourage you to take your nausea medication as directed.  BELOW ARE SYMPTOMS THAT SHOULD BE REPORTED IMMEDIATELY: *FEVER GREATER THAN 100.4 F (38 C) OR HIGHER *CHILLS OR SWEATING *NAUSEA AND VOMITING THAT IS NOT CONTROLLED WITH YOUR NAUSEA MEDICATION *UNUSUAL SHORTNESS OF BREATH *UNUSUAL BRUISING OR BLEEDING *URINARY PROBLEMS (pain or burning when urinating, or frequent urination) *BOWEL PROBLEMS (unusual diarrhea, constipation, pain near the anus) TENDERNESS IN MOUTH AND THROAT WITH OR WITHOUT PRESENCE OF ULCERS (sore throat, sores in mouth, or a toothache) UNUSUAL RASH, SWELLING OR PAIN  UNUSUAL VAGINAL DISCHARGE OR ITCHING   Items with * indicate a potential emergency and should be followed up as soon as possible or go to the Emergency Department if any problems should occur.  Please show the CHEMOTHERAPY ALERT CARD or IMMUNOTHERAPY ALERT CARD at  check-in to the Emergency Department and triage nurse.  Should you have questions after your visit or need to cancel or reschedule your appointment, please contact [facility]  Dept: [phone]  and follow the prompts.  Office hours are 8:00 a.m. to 4:30 p.m. Monday - Friday. Please note that voicemails left after 4:00 p.m. may not be returned until the following business day.  We are closed weekends and major holidays. You have access to a nurse at all times for urgent questions. Please call the main number to the clinic Dept: [phone] and follow the prompts.   For any non-urgent questions, you may also contact your provider using MyChart. We now offer e-Visits for anyone 89 and older to request care online for non-urgent symptoms. For details visit [email or web address].   Also download the MyChart app! Go to the app store, search "MyChart", open the app, select [facility], and log in with your MyChart username and password.  Due to Covid, a mask is required upon entering the hospital/clinic. If you do not have a mask, one will be given to you upon arrival. For doctor visits, patients may have 1 support person aged 92 or older with them. For treatment visits, patients cannot have anyone with them due to current Covid guidelines and our immunocompromised population.   Rehydration, Adult Rehydration is the replacement of body fluids, salts, and minerals (electrolytes) that are lost during dehydration. Dehydration is when there is not enough water or other fluids in the body. This happens when you lose more fluids than you take in. Common causes of dehydration include: Not drinking enough  fluids. This can occur when you are ill or doing activities that require a lot of energy, especially in hot weather. Conditions that cause loss of water or other fluids, such as diarrhea, vomiting, sweating, or urinating a lot. Other illnesses, such as fever or  infection. Certain medicines, such as those that remove excess fluid from the body (diuretics). Symptoms of mild or moderate dehydration may include thirst, dry lips and mouth, and dizziness. Symptoms of severe dehydration may include increased heart rate, confusion, fainting, and not urinating. For severe dehydration, you may need to get fluids through an IV at the hospital. For mild or moderate dehydration, you can usually rehydrate at home by drinking certain fluids as told by your health care provider. What are the risks? Generally, rehydration is safe. However, taking in too much fluid (overhydration) can be a problem. This is rare. Overhydration can cause an electrolyte imbalance, kidney failure, or a decrease in salt (sodium) levels in the body. Supplies needed You will need an oral rehydration solution (ORS) if your health care provider tells you to use one. This is a drink to treat dehydration. It can be found in pharmacies and retail stores. How to rehydrate Fluids Follow instructions from your health care provider for rehydration. The kind of fluid and the amount you should drink depend on your condition. In general, you should choose drinks that you prefer. If told by your health care provider, drink an ORS. Make an ORS by following instructions on the package. Start by drinking small amounts, about  cup (120 mL) every 5-10 minutes. Slowly increase how much you drink until you have taken the amount recommended by your health care provider. Drink enough clear fluids to keep your urine pale yellow. If you were told to drink an ORS, finish it first, then start slowly drinking other clear fluids. Drink fluids such as: Water. This includes sparkling water and flavored water. Drinking only water can lead to having too little sodium in your body (hyponatremia). Follow the advice of your health care provider. Water from ice chips you suck on. Fruit juice with water you add to it  (diluted). Sports drinks. Hot or cold herbal teas. Broth-based soups. Milk or milk products. Food Follow instructions from your health care provider about what to eat while you rehydrate. Your health care provider may recommend that you slowly begin eating regular foods in small amounts. Eat foods that contain a healthy balance of electrolytes, such as bananas, oranges, potatoes, tomatoes, and spinach. Avoid foods that are greasy or contain a lot of sugar. In some cases, you may get nutrition through a feeding tube that is passed through your nose and into your stomach (nasogastric tube, or NG tube). This may be done if you have uncontrolled vomiting or diarrhea. Beverages to avoid Certain beverages may make dehydration worse. While you rehydrate, avoid drinking alcohol. How to tell if you are recovering from dehydration You may be recovering from dehydration if: You are urinating more often than before you started rehydrating. Your urine is pale yellow. Your energy level improves. You vomit less frequently. You have diarrhea less frequently. Your appetite improves or returns to normal. You feel less dizzy or less light-headed. Your skin tone and color start to look more normal. Follow these instructions at home: Take over-the-counter and prescription medicines only as told by your health care provider. Do not take sodium tablets. Doing this can lead to having too much sodium in your body (hypernatremia). Contact a health care provider  if: You continue to have symptoms of mild or moderate dehydration, such as: Thirst. Dry lips. Slightly dry mouth. Dizziness. Dark urine or less urine than normal. Muscle cramps. You continue to vomit or have diarrhea. Get help right away if you: Have symptoms of dehydration that get worse. Have a fever. Have a severe headache. Have been vomiting and the following happens: Your vomiting gets worse or does not go away. Your vomit includes blood or  green matter (bile). You cannot eat or drink without vomiting. Have problems with urination or bowel movements, such as: Diarrhea that gets worse or does not go away. Blood in your stool (feces). This may cause stool to look black and tarry. Not urinating, or urinating only a small amount of very dark urine, within 6-8 hours. Have trouble breathing. Have symptoms that get worse with treatment. These symptoms may represent a serious problem that is an emergency. Do not wait to see if the symptoms will go away. Get medical help right away. Call your local emergency services (911 in the [place] Do not drive yourself to the hospital. Summary Rehydration is the replacement of body fluids and minerals (electrolytes) that are lost during dehydration. Follow instructions from your health care provider for rehydration. The kind of fluid and amount you should drink depend on your condition. Slowly increase how much you drink until you have taken the amount recommended by your health care provider. Contact your health care provider if you continue to show signs of mild or moderate dehydration. This information is not intended to replace advice given to you by your health care provider. Make sure you discuss any questions you have with your health care provider. Document Revised: 06/09/2019 Document Reviewed: 04/19/2019 Elsevier Patient Education  2022 [employer].

## 2021-07-02 ENCOUNTER — Encounter: Payer: Self-pay | Admitting: Hematology and Oncology

## 2021-07-02 ENCOUNTER — Other Ambulatory Visit: Payer: Self-pay

## 2021-07-02 ENCOUNTER — Other Ambulatory Visit (HOSPITAL_COMMUNITY): Payer: Self-pay

## 2021-07-02 ENCOUNTER — Inpatient Hospital Stay (HOSPITAL_BASED_OUTPATIENT_CLINIC_OR_DEPARTMENT_OTHER): Payer: 59 | Admitting: Hematology and Oncology

## 2021-07-02 DIAGNOSIS — M542 Cervicalgia: Secondary | ICD-10-CM

## 2021-07-02 DIAGNOSIS — C569 Malignant neoplasm of unspecified ovary: Secondary | ICD-10-CM | POA: Diagnosis not present

## 2021-07-02 DIAGNOSIS — I82C11 Acute embolism and thrombosis of right internal jugular vein: Secondary | ICD-10-CM

## 2021-07-02 DIAGNOSIS — Z5111 Encounter for antineoplastic chemotherapy: Secondary | ICD-10-CM | POA: Diagnosis not present

## 2021-07-02 DIAGNOSIS — K1379 Other lesions of oral mucosa: Secondary | ICD-10-CM | POA: Insufficient documentation

## 2021-07-02 DIAGNOSIS — C786 Secondary malignant neoplasm of retroperitoneum and peritoneum: Secondary | ICD-10-CM | POA: Diagnosis not present

## 2021-07-02 DIAGNOSIS — T451X5A Adverse effect of antineoplastic and immunosuppressive drugs, initial encounter: Secondary | ICD-10-CM | POA: Diagnosis not present

## 2021-07-02 MED ORDER — NYSTATIN 100000 UNIT/ML MT SUSP
Freq: Two times a day (BID) | OROMUCOSAL | 0 refills | Status: DC
  Filled 2021-07-02: qty 240, 12d supply, fill #0

## 2021-07-02 MED ORDER — MAGIC MOUTHWASH W/LIDOCAINE: 5.0000 mL | Freq: Four times a day (QID) | ORAL | 0 refills | Status: DC

## 2021-07-02 NOTE — Assessment & Plan Note (Signed)
It is not clear to me if the mouth sores are related to recent side effects of treatment ?I recommend a trial of Magic mouthwash with lidocaine swish and spit 4 times a day ?She can also take baking soda mixed with salt water gargle as needed ?

## 2021-07-02 NOTE — Assessment & Plan Note (Signed)
She has new musculoskeletal pain recently likely exacerbated by her swimming activity ?She has prescription pain medicine to take as needed ?

## 2021-07-02 NOTE — Assessment & Plan Note (Signed)
She has intermittent discomfort likely worsened by recent rigorous activity ?She will complete her course of anticoagulation therapy and if she has persistent pain after that, she can also add 81 mg aspirin ?So far, she has no recent bleeding complications ?

## 2021-07-02 NOTE — Assessment & Plan Note (Signed)
The port is removed due to recent blood clot causing discomfort ?She is not interested to have the port replaced anytime soon ?Due to her family affairs, her treatment will be rescheduled to March 21 in March 28 per patient request ?

## 2021-07-02 NOTE — Progress Notes (Signed)
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[place]) The port is removed due to recent blood clot causing discomfort She is not interested to have the port replaced anytime soon Due to her family affairs, her treatment will be rescheduled to March 21 in March 28 per patient request  Mouth sore secondary to chemotherapy It is not clear to me if the mouth sores are related to recent side effects of treatment I recommend a trial of Magic mouthwash with lidocaine swish and spit 4 times a day She can also take baking soda mixed with salt water gargle as needed  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   All questions were answered. The patient knows to call the clinic with any problems, questions or concerns. The total time spent in the appointment was 30 minutes encounter with patients including review of chart and various tests results, discussions about plan of care and coordination of care plan   Jacqueline Lark, MD 07/02/2021 10:46 AM  INTERVAL HISTORY: Please see below for problem oriented charting. she returns for treatment follow-up seen due to multiple symptoms She had recent swimming and it seems to aggravate her neck pain She denies bleeding complications from anticoagulation therapy She noticed some blisters in her mouth recently She needs to  reschedule some of her chemotherapy due to multiple family events  REVIEW OF SYSTEMS:   Constitutional: Denies fevers, chills or abnormal weight loss Eyes: Denies blurriness of vision Respiratory: Denies cough, dyspnea or wheezes Cardiovascular: Denies palpitation, chest discomfort or lower extremity swelling Gastrointestinal:  Denies nausea, heartburn or change in bowel habits Skin: Denies abnormal skin rashes Lymphatics: Denies new lymphadenopathy or easy bruising Neurological:Denies numbness, tingling or new weaknesses Behavioral/Psych: Mood is stable, no new changes  All other systems were reviewed with the patient and are negative.  I have reviewed the past medical history, past surgical history, social history and family history with the patient and they are unchanged from previous note.  ALLERGIES:  is allergic to codeine, hydrocodone-acetaminophen, oxycodone, thimerosal, tramadol hcl, ciprofloxacin, daypro [oxaprozin], levofloxacin, stadol [butorphanol tartrate], and sulfa antibiotics.  MEDICATIONS:  Current Outpatient Medications  Medication Sig Dispense Refill   magic mouthwash w/lidocaine SOLN Take 5 mLs by mouth 4 (four) times daily. Swish and spit 4 x a day 240 mL 0   acetaminophen (TYLENOL) 500 MG tablet Take 1,000 mg by mouth every 6 (six) hours as needed for moderate pain.     Biotin 5 MG TABS Take 5 mg by mouth daily.     buPROPion (WELLBUTRIN SR) 200 MG 12 hr tablet Take 1 tablet by mouth 2 times daily. (need office visit for refills) 60 tablet 0   Ca Phosphate-Cholecalciferol (EQL CALCIUM GUMMIES) 250-400 MG-UNIT CHEW Chew 1 tablet by mouth 2 (two) times daily.     cyclobenzaprine (FLEXERIL) 5 MG tablet Take 1 tablet (5 mg total) by mouth 3 (three)  times daily as needed for muscle spasms. 20 tablet 0   escitalopram (LEXAPRO) 20 MG tablet TAKE 1 TABLET BY MOUTH ONCE A DAY 90 tablet 3   gabapentin (NEURONTIN) 300 MG capsule Take 1 capsule (300 mg total) by mouth 2 (two)  times daily. 60 capsule 3   HYDROmorphone (DILAUDID) 2 MG tablet Take 1 tablet by mouth every 4 (four) hours as needed for severe pain. 30 tablet 0   lidocaine-prilocaine (EMLA) cream Apply topically as needed. 30 g 3   LORazepam (ATIVAN) 0.5 MG tablet Take 1 tablet (0.5 mg total) by mouth 2 (two) times daily as needed for anxiety. 30 tablet 0   metFORMIN (GLUCOPHAGE) 500 MG tablet Take 1 tablet by mouth every morning and 2 tablets every evening as directed (need office visit for refills) 90 tablet 0   mirabegron ER (MYRBETRIQ) 50 MG TB24 tablet Take 1 tablet by mouth once daily 90 tablet 3   mupirocin ointment (BACTROBAN) 2 % Apply to the inside of each nostril twice daily for five (5) days. After application, press sides of nose together and gently massage. (Patient taking differently: Place 1 application into the nose 2 (two) times daily as needed (nose sores).) 22 g 1   omeprazole (PRILOSEC) 20 MG capsule Take 1 capsule by mouth once a day 90 capsule 3   ondansetron (ZOFRAN) 8 MG tablet Take 1 tablet (8 mg total) by mouth every 8 (eight) hours as needed for nausea or vomiting. 60 tablet 0   Polyethyl Glycol-Propyl Glycol (SYSTANE OP) Place 1 drop into both eyes daily as needed (dry eyes).     Prenatal MV & Min w/FA-DHA (PRENATAL GUMMIES) 0.18-25 MG CHEW Chew 1 capsule by mouth daily.     Probiotic Product (ALIGN PO) Take 1 tablet by mouth daily.      prochlorperazine (COMPAZINE) 10 MG tablet Take 1 tablet by mouth every 6 hours as needed for nausea or vomiting. 30 tablet 1   RIVAROXABAN (XARELTO) VTE STARTER PACK (15 & 20 MG) Follow package directions: Take one 71m tablet by mouth twice a day. On day 22, switch to one 282mtablet once a day. Take with food. 51 each 0   No current facility-administered medications for this visit.    SUMMARY OF ONCOLOGIC HISTORY: Oncology History Overview Note  2018 - Core biopsy for ER/PR and Foundation One testing performed. Unfortunately, no sufficient  tissue for Foundation One testing. ER was 50% and PR 90%. 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     - 09/2000 Chemotherapy   6 cycles of IP cisplatin and etoposide    02/2009 Relapse/Recurrence   CT showed increased size of nodules in pelvis   2010 Surgery   Exlap with section of tumor nodules near cecum and left pelvic sidewall. Tumor: ER negative, PR positive    Treatment Plan Change   Alternated 2 week courses of Megace and Tamoxifen - ended 03/2012   03/2012 PET scan  CT - progressive disease   2013 Treatment Plan Change   Letrozole   01/2016 Imaging   MRI showed progressive disease   2017 Treatment Plan Change   Two weeks of alternating Tamoxifen 4m daily and then Megace 464mTID   08/2016 Imaging   MRI showed progressive disease with peritoneal implants near liver, in pelvis   01/2017 Treatment Plan Change   Lupron 11.25 q 3 months   11/2017 Imaging   Overall mixed response.   Mixed cystic/solid lesions in the left pelvis are mildly improved.   Cystic peritoneal disease, including the dominant lesion along the posterior right hepatic lobe, is mildly progressed.   Subcapsular lesion along the posterior right hepatic lobe is unchanged.   03/2018 Imaging   MRI A/P: Mixed response of individual peritoneal metastases in the pelvis, as  described above. Overall, there has been no significant change in bulk of disease.   Stable cystic peritoneal metastatic disease along the capsular surfaces of the liver and spleen.   No new sites of metastatic disease identified within the abdomen or pelvis.   08/2018 Imaging   MRI A/P: Status post hysterectomy and bilateral salpingo-oophorectomy.   Mixed cystic/solid peritoneal implants in the abdomen/pelvis, as above. Dominant cystic implant along the posterior liver surface is mildly increased. Remaining lesions are overall grossly unchanged.   No new lesions are identified.   01/28/2019 Imaging   Mri A/P: 1. Relatively similar appearance of peritoneal metastasis. A posterior right hepatic capsular based lesion is similar to minimally decreased in size. Left pelvic implants are primarily similar with possible enlargement of an anterior high left pelvic cystic implant. No new disease identified. 2.  Aortic Atherosclerosis (ICD10-I70.0). 3. Hepatic steatosis. 4. Left adrenal adenoma.   06/02/2019 Imaging   MRI 1. Potential slight enlargement of dominant cystic area and solid component, associated with rind like signal variation on T2 along the inferior right hepatic margin, also potentially slightly increased. Findings may still be within the realm of measurement and technical variation. Close attention on follow-up. 2. Subtle cystic changes along the cephalad margin of the spleen are difficult to see on previous imaging, perhaps new compared with prior imaging studies. 3. Pelvic implants and left lower quadrant lesion with similar size, of the area along the left iliac vasculature may be slightly larger than on the prior study. 4. Signs of extensive retroperitoneal and pelvic lymphadenectomy. 5. Hepatic steatosis. 6. 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     06/08/2019 Cancer Staging   Staging form: [place], AJCC 7th Edition -  Clinical: Stage IIIC (rT2, N1, M0) - Signed by GoHeath LarkMD on 06/08/2019    06/10/2019 Imaging   1. 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 These findings are slightly worsened in comparison to a remote prior CT examination dated 10/04/2014.   2.  No evidence of metastatic disease in the chest.   3. Status post hysterectomy, pelvic and retroperitoneal lymph node dissection, and ventral hernia mesh repair.   4.  Hepatic steatosis.   5.  Aortic Atherosclerosis (ICD10-I70.0).   06/24/2019 - 09/02/2019 Chemotherapy   The patient had carboplatin for chemotherapy treatment.     09/02/2019 Tumor Marker   Patient's tumor was tested for the following markers: Inhibin B Results of the tumor marker test revealed  94   09/23/2019 Imaging   1. Interval progression of the soft tissue lesions in the anterior left pelvis, likely peritoneal implants, compatible with disease progression. Remaining sites of apparent disease along the liver capsule and posterior spleen are stable 2. Stable 2 cm left adrenal adenoma. 3. Hepatic steatosis. 4. Right-side predominant colonic diverticulosis without diverticulitis. 5. Aortic Atherosclerosis (ICD10-I70.0).   12/23/2019 Imaging   1. Slight interval increase in size of a mixed solid and cystic nodule in the left hemipelvis measuring 3.0 x 2.9 cm, previously 2.7 x 2.1 cm. 2. Interval decrease in size of a nodule in the left paracolic gutter measuring 1.0 x 0.8 cm, previously 2.1 x 1.6 cm. 3. Stable peritoneal nodules in the hepatorenal recess and at the inferior tip of the spleen. 4. Unchanged nodule or lymph node overlying the left external iliac artery. 5. Enlargement of dominant left pelvic nodule is concerning for disease progression despite interval decrease in size of a nodule in  the left paracolic gutter and stability of other nodules. 6. No evidence of metastatic disease in the chest. 7. Stable, benign left adrenal adenoma. 8. Ventral hernia mesh repair with a small component of recurrent hernia inferiorly, containing a single nonobstructed loop of small bowel. 9. Hepatic steatosis. 10. Aortic Atherosclerosis (ICD10-I70.0).   12/23/2019 Tumor Marker   Patient's tumor was tested for the following markers: Inhibin B Results of the tumor marker test revealed 94.9   01/25/2020 Tumor Marker   Patient's tumor was tested for the following markers: Inhibin B Results of the tumor marker test revealed 116.7   02/20/2020 Genetic Testing   Negative genetic testing: no pathogenic variants detected in [facility] Multi-Cancer Panel.  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  The report date is February 20, 2020.   The Multi-Cancer Panel offered by [facility] includes sequencing and/or deletion duplication testing of the following 85 genes: AIP, ALK, APC, ATM, AXIN2,BAP1,  BARD1, BLM, BMPR1A, BRCA1, BRCA2, BRIP1, CASR, CDC73, CDH1, CDK4, CDKN1B, CDKN1C, CDKN2A (p14ARF), CDKN2A (p16INK4a), CEBPA, CHEK2, CTNNA1, DICER1, DIS3L2, EGFR (c.2369C>T, p.Thr790Met variant only), EPCAM (Deletion/duplication testing only), FH, FLCN, GATA2, GPC3, GREM1 (Promoter region deletion/duplication testing only), HOXB13 (c.251G>A, p.Gly84Glu), HRAS, KIT, MAX, MEN1, MET, MITF (c.952G>A, p.Glu318Lys variant only), MLH1, MSH2, MSH3, MSH6, MUTYH, NBN, NF1, NF2, NTHL1, PALB2, PDGFRA, PHOX2B, PMS2, POLD1, POLE, POT1, PRKAR1A, PTCH1, PTEN, RAD50, RAD51C, RAD51D, RB1, RECQL4, RET, RNF43, RUNX1, SDHAF2, SDHA (sequence changes only), SDHB, SDHC, SDHD, SMAD4, SMARCA4, SMARCB1, SMARCE1, STK11, SUFU, TERC, TERT, TMEM127, TP53, TSC1, TSC2, VHL, WRN and WT1.    03/02/2020 Tumor Marker   Patient's tumor was tested for the following markers: Inhibin B Results of the tumor marker test revealed 80.8    04/17/2020 Imaging   1. Overall, exam is stable. Multiple peritoneal nodules are again seen. The index nodule in the left lower quadrant is mildly increased in size in the interval. The lesion within the anterior left pelvis has decreased in size in the interval. There has also been decrease in size of cystic lesion within the a hepatorenal recess. The remaining peritoneal lesions are unchanged. No new lesions identified. 2. Stable left adrenal nodule. 3.  Aortic Atherosclerosis (ICD10-I70.0).     08/21/2020 Imaging   Bone density is normal AP spine T score 0.8 Femoral neck on the left, T score -0.2 Femoral neck on the right ,T score -0.4   10/17/2020 Imaging   1. Interval progression of peritoneal nodules along the left pelvic sidewall, concerning for progressive metastatic disease. 2.  Small capsular lesion medial right liver and the apparent cystic lesion superior to the right kidney are similar to prior. 3. Tiny soft tissue nodule anterior aspect of the lateral left pelvis described as decreasing on the prior study has decreased further on today's exam. 4. Stable left adrenal nodule. This cannot be definitively characterized. 5. Tiny nonobstructing stone lower pole right kidney. 6. Aortic Atherosclerosis (ICD10-I70.0).   10/26/2020 Procedure   Successful placement of a right internal jugular approach power injectable Port-A-Cath. The catheter is ready for immediate use.       10/31/2020 -  Chemotherapy   Patient is on Treatment Plan : OVARIAN Paclitaxel T7,0,01,74 q28d     01/25/2021 Imaging   1. Signs of peritoneal disease with scattered peritoneal nodules. The index lesions are either stable or decreased in size in the interval as detailed above. No new sites of disease. 2. Ventral pelvic wall hernia contains a nonobstructed loop of small bowel. 3. Aortic Atherosclerosis (ICD10-I70.0).     06/14/2021 Imaging   1. Multiple peritoneal metastatic nodules are slightly diminished in  size. 2. A left external iliac lymph node or nodule is however stable to slightly enlarged. 3. Overall findings are most consistent with continued treatment response of metastatic disease. 4. No evidence of metastatic disease within the chest. 5. Hepatic steatosis.     06/14/2021 Imaging   Right: Findings consistent with 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   Left: No evidence of thrombosis in the subclavian.   06/20/2021 Procedure   Successful right IJ vein Port-A-Cath explant.       PHYSICAL EXAMINATION: ECOG PERFORMANCE STATUS: 1 - Symptomatic but completely ambulatory  Vitals:   07/02/21 1001  BP: (!) 111/45  Pulse: 65  Resp: 18  SpO2: 99%   Filed Weights   07/02/21 1001  Weight: 190 lb 6.4 oz (86.4 kg)    GENERAL:alert, no distress and comfortable SKIN: skin color, texture, turgor are normal, no rashes or significant lesions EYES: normal, Conjunctiva are pink and non-injected, sclera clear OROPHARYNX: Noted some mucositis/blister in the right buccal mucosa and under her tongue NECK: supple, thyroid normal size, non-tender, without nodularity LYMPH:  no palpable lymphadenopathy in the cervical, axillary or inguinal LUNGS: clear to auscultation and percussion with normal breathing effort HEART: regular rate & rhythm and no murmurs and no lower extremity edema ABDOMEN:abdomen soft, non-tender and normal bowel sounds Musculoskeletal:no cyanosis of digits and no clubbing  NEURO: alert & oriented x 3 with fluent speech, no focal motor/sensory deficits  LABORATORY DATA:  I have reviewed the data as listed    Component Value Date/Time   NA 134 (L) 06/27/2021 1531   NA 139 11/30/2019 0913   NA 141 09/24/2013 1131   K 4.2 06/27/2021 1531   K 4.5 09/24/2013 1131   CL 100 06/27/2021 1531   CO2 28 06/27/2021 1531   CO2 24 09/24/2013 1131   GLUCOSE 109 (H) 06/27/2021 1531   GLUCOSE 90 09/24/2013  1131   BUN 28 (H) 06/27/2021 1531   BUN 32 (H) 11/30/2019 0913   BUN 21.2 09/24/2013 1131   CREATININE 1.08 (H) 06/27/2021 1531   CREATININE 1.19 (H) 06/20/2021 1513   CREATININE 1.1 09/24/2013 1131   CALCIUM 9.0 06/27/2021 1531   CALCIUM 9.5 09/24/2013 1131   PROT 7.0 06/27/2021 1531   PROT 7.0 11/30/2019 0913   PROT 6.8 09/24/2013 1131   ALBUMIN 3.9 06/27/2021 1531   ALBUMIN 4.3 11/30/2019  0913   ALBUMIN 3.9 09/24/2013 1131   AST 22 06/27/2021 1531   AST 21 06/20/2021 1513   AST 23 09/24/2013 1131   ALT 21 06/27/2021 1531   ALT 19 06/20/2021 1513   ALT 27 09/24/2013 1131   ALKPHOS 66 06/27/2021 1531   ALKPHOS 81 09/24/2013 1131   BILITOT 0.3 06/27/2021 1531   BILITOT 0.4 06/20/2021 1513   BILITOT 0.53 09/24/2013 1131   GFRNONAA 57 (L) 06/27/2021 1531   GFRNONAA 51 (L) 06/20/2021 1513   GFRAA 55 (L) 01/24/2020 1530   GFRAA 57 (L) 12/21/2019 1259    No results found for: SPEP, UPEP  Lab Results  Component Value Date   WBC 5.7 06/27/2021   NEUTROABS 2.8 06/27/2021   HGB 13.1 06/27/2021   HCT 39.2 06/27/2021   MCV 93.8 06/27/2021   PLT 259 06/27/2021      Chemistry      Component Value Date/Time   NA 134 (L) 06/27/2021 1531   NA 139 11/30/2019 0913   NA 141 09/24/2013 1131   K 4.2 06/27/2021 1531   K 4.5 09/24/2013 1131   CL 100 06/27/2021 1531   CO2 28 06/27/2021 1531   CO2 24 09/24/2013 1131   BUN 28 (H) 06/27/2021 1531   BUN 32 (H) 11/30/2019 0913   BUN 21.2 09/24/2013 1131   CREATININE 1.08 (H) 06/27/2021 1531   CREATININE 1.19 (H) 06/20/2021 1513   CREATININE 1.1 09/24/2013 1131      Component Value Date/Time   CALCIUM 9.0 06/27/2021 1531   CALCIUM 9.5 09/24/2013 1131   ALKPHOS 66 06/27/2021 1531   ALKPHOS 81 09/24/2013 1131   AST 22 06/27/2021 1531   AST 21 06/20/2021 1513   AST 23 09/24/2013 1131   ALT 21 06/27/2021 1531   ALT 19 06/20/2021 1513   ALT 27 09/24/2013 1131   BILITOT 0.3 06/27/2021 1531   BILITOT 0.4 06/20/2021 1513    BILITOT 0.53 09/24/2013 1131       RADIOGRAPHIC STUDIES: I have personally reviewed the radiological images as listed and agreed with the findings in the report. CT CHEST W CONTRAST  Result Date: 06/13/2021 CLINICAL DATA:  Ovarian cancer, assess treatment response EXAM: CT CHEST, ABDOMEN, AND PELVIS WITH CONTRAST TECHNIQUE: Multidetector CT imaging of the chest, abdomen and pelvis was performed following the standard protocol during bolus administration of intravenous contrast. RADIATION DOSE REDUCTION: This exam was performed according to the departmental dose-optimization program which includes automated exposure control, adjustment of the mA and/or kV according to patient size and/or use of iterative reconstruction technique. CONTRAST:  157m OMNIPAQUE IOHEXOL 300 MG/ML SOLN, additional oral enteric contrast COMPARISON:  CT abdomen pelvis, 01/23/2021, 10/16/2020, CT chest, 12/22/2019 FINDINGS: CT CHEST FINDINGS Cardiovascular: Right chest port catheter. Aortic atherosclerosis. Normal heart size. No pericardial effusion. Mediastinum/Nodes: No enlarged mediastinal, hilar, or axillary lymph nodes. Thyroid gland, trachea, and esophagus demonstrate no significant findings. Lungs/Pleura: Lungs are clear. No pleural effusion or pneumothorax. Musculoskeletal: No chest wall mass or suspicious osseous lesions identified. CT ABDOMEN PELVIS FINDINGS Hepatobiliary: No solid liver abnormality is seen. Hepatic steatosis. No gallstones, gallbladder wall thickening, or biliary dilatation. Pancreas: Unremarkable. No pancreatic ductal dilatation or surrounding inflammatory changes. Spleen: Normal in size. Subcapsular cystic lesion of the inferior tip of the spleen is unchanged measuring 1.2 x 1.2 cm (series 2, image 60). Adrenals/Urinary Tract: Unchanged left lateral limb adrenal adenoma measuring 1.8 x 1.4 cm (series 2, image 59). Partially exophytic benign simple cyst of the lateral midportion  of the left kidney. Kidneys  are otherwise normal, without renal calculi, solid lesion, or hydronephrosis. Bladder is unremarkable. Stomach/Bowel: Stomach is within normal limits. Appendix not clearly visualized. No evidence of bowel wall thickening, distention, or inflammatory changes. Vascular/Lymphatic: Aortic atherosclerosis. Left external iliac lymph node is stable to slightly enlarged measuring 1.4 x 1.4 cm, previously 1.3 x 1.2 cm (series 2, image 100). Reproductive: Status post hysterectomy and oophorectomy. Other: Ventral mesh hernia repair. Unchanged small low midline ventral hernia containing a single nonobstructed loop of small bowel (series 2, image 112). No ascites. Left pelvic sidewall nodule is slightly diminished in size, measuring 1.3 x 1.1 cm, previously 1.6 x 1.5 cm (series 2, image 114). Left paracolic gutter nodule is decreased in size measuring 0.8 x 0.6 cm, previously 1.3 x 1.0 cm (series 2, image 100). Hepatorenal recess nodule is decreased in size measuring 1.6 x 0.6 cm, previously 1.9 x 1.1 cm (series 2, image 61) Musculoskeletal: No acute osseous findings. IMPRESSION: 1. Multiple peritoneal metastatic nodules are slightly diminished in size. 2. A left external iliac lymph node or nodule is however stable to slightly enlarged. 3. Overall findings are most consistent with continued treatment response of metastatic disease. 4. No evidence of metastatic disease within the chest. 5. Hepatic steatosis. Aortic Atherosclerosis (ICD10-I70.0). Electronically Signed   By: Delanna Ahmadi M.D.   On: 06/13/2021 16:46   CT ABDOMEN PELVIS W CONTRAST  Result Date: 06/13/2021 CLINICAL DATA:  Ovarian cancer, assess treatment response EXAM: CT CHEST, ABDOMEN, AND PELVIS WITH CONTRAST TECHNIQUE: Multidetector CT imaging of the chest, abdomen and pelvis was performed following the standard protocol during bolus administration of intravenous contrast. RADIATION DOSE REDUCTION: This exam was performed according to the departmental  dose-optimization program which includes automated exposure control, adjustment of the mA and/or kV according to patient size and/or use of iterative reconstruction technique. CONTRAST:  132m OMNIPAQUE IOHEXOL 300 MG/ML SOLN, additional oral enteric contrast COMPARISON:  CT abdomen pelvis, 01/23/2021, 10/16/2020, CT chest, 12/22/2019 FINDINGS: CT CHEST FINDINGS Cardiovascular: Right chest port catheter. Aortic atherosclerosis. Normal heart size. No pericardial effusion. Mediastinum/Nodes: No enlarged mediastinal, hilar, or axillary lymph nodes. Thyroid gland, trachea, and esophagus demonstrate no significant findings. Lungs/Pleura: Lungs are clear. No pleural effusion or pneumothorax. Musculoskeletal: No chest wall mass or suspicious osseous lesions identified. CT ABDOMEN PELVIS FINDINGS Hepatobiliary: No solid liver abnormality is seen. Hepatic steatosis. No gallstones, gallbladder wall thickening, or biliary dilatation. Pancreas: Unremarkable. No pancreatic ductal dilatation or surrounding inflammatory changes. Spleen: Normal in size. Subcapsular cystic lesion of the inferior tip of the spleen is unchanged measuring 1.2 x 1.2 cm (series 2, image 60). Adrenals/Urinary Tract: Unchanged left lateral limb adrenal adenoma measuring 1.8 x 1.4 cm (series 2, image 59). Partially exophytic benign simple cyst of the lateral midportion of the left kidney. Kidneys are otherwise normal, without renal calculi, solid lesion, or hydronephrosis. Bladder is unremarkable. Stomach/Bowel: Stomach is within normal limits. Appendix not clearly visualized. No evidence of bowel wall thickening, distention, or inflammatory changes. Vascular/Lymphatic: Aortic atherosclerosis. Left external iliac lymph node is stable to slightly enlarged measuring 1.4 x 1.4 cm, previously 1.3 x 1.2 cm (series 2, image 100). Reproductive: Status post hysterectomy and oophorectomy. Other: Ventral mesh hernia repair. Unchanged small low midline ventral hernia  containing a single nonobstructed loop of small bowel (series 2, image 112). No ascites. Left pelvic sidewall nodule is slightly diminished in size, measuring 1.3 x 1.1 cm, previously 1.6 x 1.5 cm (series 2, image 114). Left paracolic  gutter nodule is decreased in size measuring 0.8 x 0.6 cm, previously 1.3 x 1.0 cm (series 2, image 100). Hepatorenal recess nodule is decreased in size measuring 1.6 x 0.6 cm, previously 1.9 x 1.1 cm (series 2, image 61) Musculoskeletal: No acute osseous findings. IMPRESSION: 1. Multiple peritoneal metastatic nodules are slightly diminished in size. 2. A left external iliac lymph node or nodule is however stable to slightly enlarged. 3. Overall findings are most consistent with continued treatment response of metastatic disease. 4. No evidence of metastatic disease within the chest. 5. Hepatic steatosis. Aortic Atherosclerosis (ICD10-I70.0). Electronically Signed   By: Delanna Ahmadi M.D.   On: 06/13/2021 16:46   IR REMOVAL TUN ACCESS W/ PORT W/O FL MOD SED  Result Date: 06/19/2021 INDICATION: 66 year old woman status post right IJ chest port placement on 10/25/2020 by Dr. Dwaine Gale returns with increasing right neck pain over the last 10 days and ultrasound evidence of right IJ and subclavian thrombus. EXAM: REMOVAL RIGHT IJ VEIN PORT-A-CATH MEDICATIONS: None ANESTHESIA/SEDATION: Moderate (conscious) sedation was employed during this procedure. A total of Versed 1 mg and Fentanyl 25 mcg was administered intravenously by the radiology nurse. Total intra-service moderate Sedation Time: 21 minutes. The patient's level of consciousness and vital signs were monitored continuously by radiology nursing throughout the procedure under my direct supervision. COMPLICATIONS: None immediate. PROCEDURE: Informed written consent was obtained from the patient after a thorough discussion of the procedural risks, benefits and alternatives. All questions were addressed. Maximal Sterile Barrier Technique  was utilized including caps, mask, sterile gowns, sterile gloves, sterile drape, hand hygiene and skin antiseptic. A timeout was performed prior to the initiation of the procedure. Right anterior upper chest prepped and draped in the usual sterile fashion. Following local lidocaine administration, an incision was made at the superior margin of the port pocket. The pocket was dissected, and the port and catheter were removed in their entirety. The incision was closed with deep and superficial absorbable suture and sealed with Dermabond. IMPRESSION: Successful right IJ vein Port-A-Cath explant. Electronically Signed   By: Miachel Roux M.D.   On: 06/19/2021 15:23   VAS [name] UPPER EXTREMITY VENOUS DUPLEX  Result Date: 06/14/2021 UPPER VENOUS STUDY  Patient Name:  BURNELL MATLIN  Date of Exam:   06/14/2021 Medical Rec #: [id number]     Accession #:    [id number] Date of Birth: 1955/05/25     Patient Gender: F Patient Age:   30 years Exam Location:  [facility] Procedure:      VAS [name] UPPER EXTREMITY VENOUS DUPLEX Referring Phys: Mordechai Matuszak --------------------------------------------------------------------------------  Indications: Right neck pain and swelling x10 days Comparison Study: No prior study Performing Technologist: Maudry Mayhew MHA, RDMS, RVT, RDCS  Examination Guidelines: A complete evaluation includes B-mode imaging, spectral Doppler, color Doppler, and power Doppler as needed of all accessible portions of each vessel. Bilateral testing is considered an integral part of a complete examination. Limited examinations for reoccurring indications may be performed as noted.  Right Findings: +----------+------------+---------+-----------+-----------------+--------------+  RIGHT      Compressible Phasicity Spontaneous    Properties        Summary      +----------+------------+---------+-----------+-----------------+--------------+  IJV          Partial       Yes        Yes         Partially          Age  occluded      Indeterminate   +----------+------------+---------+-----------+-----------------+--------------+  Subclavian   Partial       Yes        Yes                            Age                                                                         Indeterminate   +----------+------------+---------+-----------+-----------------+--------------+  Axillary       Full        Yes        Yes                                       +----------+------------+---------+-----------+-----------------+--------------+  Brachial       Full        Yes        Yes                                       +----------+------------+---------+-----------+-----------------+--------------+  Radial         Full                                                             +----------+------------+---------+-----------+-----------------+--------------+  Ulnar          Full                                                             +----------+------------+---------+-----------+-----------------+--------------+  Cephalic       Full                                                             +----------+------------+---------+-----------+-----------------+--------------+  Basilic        Full                                                             +----------+------------+---------+-----------+-----------------+--------------+  Left Findings: +----------+------------+---------+-----------+----------+-------+  LEFT       Compressible Phasicity Spontaneous Properties Summary  +----------+------------+---------+-----------+----------+-------+  Subclavian                 Yes        Yes                         +----------+------------+---------+-----------+----------+-------+  Summary:  Right: Findings consistent with 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  Left: No evidence of thrombosis in the subclavian.  *See  table(s) above for measurements and observations.  Diagnosing physician: Orlie Pollen Electronically signed by Orlie Pollen on 06/14/2021 at 6:07:48 PM.    Final

## 2021-07-04 DIAGNOSIS — N390 Urinary tract infection, site not specified: Secondary | ICD-10-CM | POA: Diagnosis not present

## 2021-07-05 ENCOUNTER — Telehealth: Payer: Self-pay

## 2021-07-05 ENCOUNTER — Other Ambulatory Visit: Payer: Self-pay

## 2021-07-05 ENCOUNTER — Other Ambulatory Visit: Payer: Self-pay | Admitting: Hematology and Oncology

## 2021-07-05 ENCOUNTER — Encounter: Payer: Self-pay | Admitting: Hematology and Oncology

## 2021-07-05 MED ORDER — LORAZEPAM 0.5 MG PO TABS
0.5000 mg | ORAL_TABLET | Freq: Two times a day (BID) | ORAL | 0 refills | Status: DC | PRN
Start: 2021-07-05 — End: 2021-08-30

## 2021-07-05 NOTE — Telephone Encounter (Signed)
Notified Patient of completion of FMLA paperwork. Fax transmission confirmation received. Patient to pick-up copy of paperwork. ?

## 2021-07-09 ENCOUNTER — Encounter (INDEPENDENT_AMBULATORY_CARE_PROVIDER_SITE_OTHER): Payer: Self-pay | Admitting: Family Medicine

## 2021-07-09 ENCOUNTER — Other Ambulatory Visit: Payer: Self-pay

## 2021-07-09 ENCOUNTER — Ambulatory Visit (INDEPENDENT_AMBULATORY_CARE_PROVIDER_SITE_OTHER): Payer: 59 | Admitting: Family Medicine

## 2021-07-09 ENCOUNTER — Other Ambulatory Visit (HOSPITAL_COMMUNITY): Payer: Self-pay

## 2021-07-09 VITALS — BP 110/69 | HR 59 | Temp 97.9°F | Ht 66.0 in | Wt 188.0 lb

## 2021-07-09 DIAGNOSIS — E669 Obesity, unspecified: Secondary | ICD-10-CM | POA: Diagnosis not present

## 2021-07-09 DIAGNOSIS — Z6830 Body mass index (BMI) 30.0-30.9, adult: Secondary | ICD-10-CM

## 2021-07-09 DIAGNOSIS — Z8639 Personal history of other endocrine, nutritional and metabolic disease: Secondary | ICD-10-CM

## 2021-07-09 DIAGNOSIS — F3289 Other specified depressive episodes: Secondary | ICD-10-CM | POA: Diagnosis not present

## 2021-07-09 DIAGNOSIS — R739 Hyperglycemia, unspecified: Secondary | ICD-10-CM

## 2021-07-09 DIAGNOSIS — R5383 Other fatigue: Secondary | ICD-10-CM

## 2021-07-09 DIAGNOSIS — Z9189 Other specified personal risk factors, not elsewhere classified: Secondary | ICD-10-CM

## 2021-07-09 DIAGNOSIS — C569 Malignant neoplasm of unspecified ovary: Secondary | ICD-10-CM

## 2021-07-09 MED ORDER — BUPROPION HCL ER (SR) 200 MG PO TB12
200.0000 mg | ORAL_TABLET | Freq: Two times a day (BID) | ORAL | 0 refills | Status: DC
Start: 2021-07-09 — End: 2021-08-28
  Filled 2021-07-09: qty 60, 30d supply, fill #0

## 2021-07-09 MED ORDER — METFORMIN HCL 500 MG PO TABS
ORAL_TABLET | ORAL | 0 refills | Status: DC
  Filled 2021-07-09: qty 90, 30d supply, fill #0

## 2021-07-10 ENCOUNTER — Encounter (INDEPENDENT_AMBULATORY_CARE_PROVIDER_SITE_OTHER): Payer: Self-pay | Admitting: Family Medicine

## 2021-07-10 ENCOUNTER — Encounter: Payer: Self-pay | Admitting: Hematology and Oncology

## 2021-07-10 ENCOUNTER — Other Ambulatory Visit: Payer: 59

## 2021-07-10 ENCOUNTER — Inpatient Hospital Stay: Payer: 59

## 2021-07-10 ENCOUNTER — Ambulatory Visit: Payer: 59

## 2021-07-10 VITALS — BP 134/59 | HR 69 | Temp 96.9°F | Resp 18

## 2021-07-10 DIAGNOSIS — Z7189 Other specified counseling: Secondary | ICD-10-CM

## 2021-07-10 DIAGNOSIS — C569 Malignant neoplasm of unspecified ovary: Secondary | ICD-10-CM | POA: Diagnosis not present

## 2021-07-10 DIAGNOSIS — Z5111 Encounter for antineoplastic chemotherapy: Secondary | ICD-10-CM | POA: Diagnosis not present

## 2021-07-10 DIAGNOSIS — C786 Secondary malignant neoplasm of retroperitoneum and peritoneum: Secondary | ICD-10-CM | POA: Diagnosis not present

## 2021-07-10 LAB — CMP14+EGFR
ALT: 17 IU/L (ref 0–32)
AST: 17 IU/L (ref 0–40)
Albumin/Globulin Ratio: 2.3 — ABNORMAL HIGH (ref 1.2–2.2)
Albumin: 4.3 g/dL (ref 3.8–4.8)
Alkaline Phosphatase: 78 IU/L (ref 44–121)
BUN/Creatinine Ratio: 17 (ref 12–28)
BUN: 20 mg/dL (ref 8–27)
Bilirubin Total: 0.3 mg/dL (ref 0.0–1.2)
CO2: 25 mmol/L (ref 20–29)
Calcium: 9.6 mg/dL (ref 8.7–10.3)
Chloride: 104 mmol/L (ref 96–106)
Creatinine, Ser: 1.15 mg/dL — ABNORMAL HIGH (ref 0.57–1.00)
Globulin, Total: 1.9 g/dL (ref 1.5–4.5)
Glucose: 99 mg/dL (ref 70–99)
Potassium: 4.7 mmol/L (ref 3.5–5.2)
Sodium: 140 mmol/L (ref 134–144)
Total Protein: 6.2 g/dL (ref 6.0–8.5)
eGFR: 53 mL/min/{1.73_m2} — ABNORMAL LOW (ref >59)

## 2021-07-10 LAB — CBC WITH DIFFERENTIAL/PLATELET
Basophils Absolute: 0 10*3/uL (ref 0.0–0.2)
Basos: 1 %
EOS (ABSOLUTE): 0.2 10*3/uL (ref 0.0–0.4)
Eos: 6 %
Hematocrit: 38.9 % (ref 34.0–46.6)
Hemoglobin: 13.4 g/dL (ref 11.1–15.9)
Immature Grans (Abs): 0 10*3/uL (ref 0.0–0.1)
Immature Granulocytes: 0 %
Lymphocytes Absolute: 1.3 10*3/uL (ref 0.7–3.1)
Lymphs: 33 %
MCH: 31.5 pg (ref 26.6–33.0)
MCHC: 34.4 g/dL (ref 31.5–35.7)
MCV: 91 fL (ref 79–97)
Monocytes Absolute: 0.4 10*3/uL (ref 0.1–0.9)
Monocytes: 11 %
Neutrophils Absolute: 1.9 10*3/uL (ref 1.4–7.0)
Neutrophils: 49 %
Platelets: 349 10*3/uL (ref 150–450)
RBC: 4.26 x10E6/uL (ref 3.77–5.28)
RDW: 13.5 % (ref 11.7–15.4)
WBC: 4 10*3/uL (ref 3.4–10.8)

## 2021-07-10 LAB — VITAMIN D 25 HYDROXY (VIT D DEFICIENCY, FRACTURES): Vit D, 25-Hydroxy: 38.6 ng/mL (ref 30.0–100.0)

## 2021-07-10 LAB — LIPID PANEL WITH LDL/HDL RATIO
Cholesterol, Total: 255 mg/dL — ABNORMAL HIGH (ref 100–199)
HDL: 50 mg/dL (ref >39)
LDL Chol Calc (NIH): 182 mg/dL — ABNORMAL HIGH (ref 0–99)
LDL/HDL Ratio: 3.6 ratio — ABNORMAL HIGH (ref 0.0–3.2)
Triglycerides: 126 mg/dL (ref 0–149)
VLDL Cholesterol Cal: 23 mg/dL (ref 5–40)

## 2021-07-10 LAB — HEMOGLOBIN A1C
Est. average glucose Bld gHb Est-mCnc: 108 mg/dL
Hgb A1c MFr Bld: 5.4 % (ref 4.8–5.6)

## 2021-07-10 LAB — TSH: TSH: 0.679 u[IU]/mL (ref 0.450–4.500)

## 2021-07-10 LAB — INSULIN, RANDOM: INSULIN: 17.1 u[IU]/mL (ref 2.6–24.9)

## 2021-07-10 MED ORDER — SODIUM CHLORIDE 0.9 % IV SOLN: Freq: Once | INTRAVENOUS | Status: AC

## 2021-07-10 MED ORDER — SODIUM CHLORIDE 0.9 % IV SOLN
60.0000 mg/m2 | Freq: Once | INTRAVENOUS | Status: AC
  Administered 2021-07-10: 120 mg via INTRAVENOUS
  Filled 2021-07-10: qty 20

## 2021-07-10 MED ORDER — FAMOTIDINE IN NACL 20-0.9 MG/50ML-% IV SOLN
20.0000 mg | Freq: Once | INTRAVENOUS | Status: AC
  Administered 2021-07-10: 20 mg via INTRAVENOUS
  Filled 2021-07-10: qty 50

## 2021-07-10 MED ORDER — DEXAMETHASONE SODIUM PHOSPHATE 10 MG/ML IJ SOLN
5.0000 mg | Freq: Once | INTRAMUSCULAR | Status: AC
  Administered 2021-07-10: 5 mg via INTRAVENOUS
  Filled 2021-07-10: qty 1

## 2021-07-10 MED ORDER — DIPHENHYDRAMINE HCL 50 MG/ML IJ SOLN
25.0000 mg | Freq: Once | INTRAMUSCULAR | Status: AC
  Administered 2021-07-10: 25 mg via INTRAVENOUS
  Filled 2021-07-10: qty 1

## 2021-07-10 NOTE — Patient Instructions (Signed)
[name]  Discharge Instructions: ?Thank you for choosing [facility] to provide your oncology and hematology care.  ? ?If you have a lab appointment with the [facility], please go directly to the [facility] and check in at the registration area. ?  ?Wear comfortable clothing and clothing appropriate for easy access to any Portacath or PICC line.  ? ?We strive to give you quality time with your provider. You may need to reschedule your appointment if you arrive late (15 or more minutes).  Arriving late affects you and other patients whose appointments are after yours.  Also, if you miss three or more appointments without notifying the office, you may be dismissed from the clinic at the provider?s discretion.    ?  ?For prescription refill requests, have your pharmacy contact our office and allow 72 hours for refills to be completed.   ? ?Today you received the following chemotherapy and/or immunotherapy agents: Paclitaxel.    ?  ?To help prevent nausea and vomiting after your treatment, we encourage you to take your nausea medication as directed. ? ?BELOW ARE SYMPTOMS THAT SHOULD BE REPORTED IMMEDIATELY: ?*FEVER GREATER THAN 100.4 F (38 ?C) OR HIGHER ?*CHILLS OR SWEATING ?*NAUSEA AND VOMITING THAT IS NOT CONTROLLED WITH YOUR NAUSEA MEDICATION ?*UNUSUAL SHORTNESS OF BREATH ?*UNUSUAL BRUISING OR BLEEDING ?*URINARY PROBLEMS (pain or burning when urinating, or frequent urination) ?*BOWEL PROBLEMS (unusual diarrhea, constipation, pain near the anus) ?TENDERNESS IN MOUTH AND THROAT WITH OR WITHOUT PRESENCE OF ULCERS (sore throat, sores in mouth, or a toothache) ?UNUSUAL RASH, SWELLING OR PAIN  ?UNUSUAL VAGINAL DISCHARGE OR ITCHING  ? ?Items with * indicate a potential emergency and should be followed up as soon as possible or go to the Emergency Department if any problems should occur. ? ?Please show the CHEMOTHERAPY ALERT CARD or IMMUNOTHERAPY ALERT CARD at check-in  to the Emergency Department and triage nurse. ? ?Should you have questions after your visit or need to cancel or reschedule your appointment, please contact [facility]  Dept: [phone]  and follow the prompts.  Office hours are 8:00 a.m. to 4:30 p.m. Monday - Friday. Please note that voicemails left after 4:00 p.m. may not be returned until the following business day.  We are closed weekends and major holidays. You have access to a nurse at all times for urgent questions. Please call the main number to the clinic Dept: [phone] and follow the prompts. ? ? ?For any non-urgent questions, you may also contact your provider using MyChart. We now offer e-Visits for anyone 35 and older to request care online for non-urgent symptoms. For details visit [email or web address]. ?  ?Also download the MyChart app! Go to the app store, search "MyChart", open the app, select [facility], and log in with your MyChart username and password. ? ?Due to Covid, a mask is required upon entering the hospital/clinic. If you do not have a mask, one will be given to you upon arrival. For doctor visits, patients may have 1 support person aged 39 or older with them. For treatment visits, patients cannot have anyone with them due to current Covid guidelines and our immunocompromised population.  ? ?

## 2021-07-10 NOTE — Telephone Encounter (Signed)
Dr.Beasley 

## 2021-07-11 ENCOUNTER — Encounter: Payer: Self-pay | Admitting: Allergy and Immunology

## 2021-07-11 ENCOUNTER — Other Ambulatory Visit: Payer: Self-pay

## 2021-07-11 ENCOUNTER — Ambulatory Visit (INDEPENDENT_AMBULATORY_CARE_PROVIDER_SITE_OTHER): Payer: 59 | Admitting: Allergy and Immunology

## 2021-07-11 VITALS — BP 130/66 | HR 64 | Resp 18 | Ht 65.0 in | Wt 196.4 lb

## 2021-07-11 DIAGNOSIS — R040 Epistaxis: Secondary | ICD-10-CM | POA: Diagnosis not present

## 2021-07-11 DIAGNOSIS — J340 Abscess, furuncle and carbuncle of nose: Secondary | ICD-10-CM | POA: Diagnosis not present

## 2021-07-11 DIAGNOSIS — L989 Disorder of the skin and subcutaneous tissue, unspecified: Secondary | ICD-10-CM | POA: Diagnosis not present

## 2021-07-11 DIAGNOSIS — H04123 Dry eye syndrome of bilateral lacrimal glands: Secondary | ICD-10-CM

## 2021-07-11 DIAGNOSIS — T50905A Adverse effect of unspecified drugs, medicaments and biological substances, initial encounter: Secondary | ICD-10-CM

## 2021-07-11 NOTE — Patient Instructions (Addendum)
?  1.  Treat nasal issue: ? ?A.  Nasal saline 3 times a day followed by Bactroban ointment ? ?2.  Treat dry eye issue: ? ?A.  Never rub or scratch eyes ?B.  Systane gel drops at bedtime ?C.  Systane drops multiple times per day ? ?3.  Treat itchy ear issue: ? ?A.  OTC 1% hydrocortisone cream daily if needed ? ?4.  Avoid sulfur drug, Daypro, ciprofloxacin, Stadol ? ?5.  Can use OTC antihistamine for opiate-induced itching ? ?6.  Can use high potency topical steroid preventatively if chemotherapy induced localized dermatosis develops in the future. ? ?7.  Return to clinic in 4 weeks or earlier if problem ? ?

## 2021-07-11 NOTE — Progress Notes (Signed)
?[facility] ? ? ?Dear Dr. Alvy Bimler, ? ?Thank you for referring [name] to the [facility] on 07/11/2021.  ? ?Below is a summation of this patient's evaluation and recommendations. ? ?Thank you for your referral. I will keep you informed about this patient's response to treatment.  ? ?If you have any questions please do not hesitate to contact me.  ? ?Sincerely, ? ?Jiles Prows, MD ?Allergy / Immunology ?[facility] ? ? ?______________________________________________________________________ ? ? ? ?NEW PATIENT NOTE ? ?Referring Provider: Heath Lark, MD ?Primary Provider: [name], MD ?Date of office visit: 07/11/2021 ?   ?Subjective:  ? ?Chief Complaint:  [name] (DOB: September 23, 1955) is a 66 y.o. female who presents to the clinic on 07/11/2021 with a chief complaint of Medication Reaction ?.    ? ?HPI: [name] presents to this clinic in evaluation of 4 main issues. ? ?First, she has medication side effects. ? ?Ciprofloxacin taken in the 80s gave rise to intense muscle weakness. ? ?Stadol taken in the 90s gave rise to very significant CNS side effects. ? ?A sulfa-based antibiotic taken in the 80s gave rise to a itchy red rash that developed 3 days after utilizing this medication and lasted 5 days and never healed with scar or hyperpigmentation. ? ?Daypro taken in the 80s gave rise to a rash.  She can use other nonsteroidal anti-inflammatory drugs without any problem ? ?Codeine gives rise to nausea and vomiting.  Opiates gives rise to itching when weaning off these medicines.  Tramadol also gives rise to itching. ? ?Carboplatinum administration by infusion gives rise to a local red and itchy and warm rash 24 hours after infusion that would last about 2 weeks.  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  She has not really had any  therapy for this issue.  She is no longer using Carboplatinum as it really did not help her neoplastic disease. ? ?Second, she has been having bleeding from her nose for many months for which she saw Dr. Lucia Gaskins, ENT, who gave her Bactroban and an antibiotic.  This still appears to be an issue with some crusty bleeding especially out of the left nostril.  She uses her Bactroban about twice a week.  She does not really have a history of atopic disease or symptoms. ? ?Third, she has issues with itchy ear ever since she has been using her hearing aids. ? ?Fourth, she has dry eye syndrome.  Her eyes are itchy and irritated.  She uses Systane only in the morning.  It sounds as though she does rub her eyes. ? ?Past Medical History:  ?Diagnosis Date  ? Anxiety   ? Arthritis   ? Cervical syndrome   ? CKD (chronic kidney disease), stage III ([place])   ? Constipation   ? COVID-19 03/26/2019  ? Family history of colon cancer 02/15/2020  ? Fatty liver   ? GERD (gastroesophageal reflux disease)   ? Granulosa cell tumor of ovary   ? History of hiatal hernia   ? Joint pain   ? Neck pain   ? Obesity   ? Osteoarthritis   ? Ovarian cancer ([place])   ? PONV (postoperative nausea and vomiting)   ? history of n/v,  past surgeries no n/v  ? Sleep apnea   ? Tremor   ? ? ?  Past Surgical History:  ?Procedure Laterality Date  ? ABDOMINAL HYSTERECTOMY    ? TAH/BSO  ? APPENDECTOMY    ? EXPLORATORY LAPAROTOMY    ? for bowel obstruction  ? IR IMAGING GUIDED PORT INSERTION  10/25/2020  ? IR REMOVAL TUN ACCESS W/ PORT W/O FL MOD SED  06/19/2021  ? left toe surgery Left   ? Secondary tumor debulking  2002  ? SHOULDER SURGERY    ? 2017 left  ? Tumor debulking  2010  ? VENTRAL HERNIA REPAIR    ? ? ?Allergies as of 07/11/2021   ? ?   Reactions  ? Codeine Nausea And Vomiting  ? Hydrocodone-acetaminophen Itching  ? Oxycodone Itching  ? Thimerosal Other (See Comments)  ? Eye redness   ? Tramadol Hcl Itching  ? Ciprofloxacin Other (See Comments)  ? FATIGUE  ? Daypro  [oxaprozin] Rash  ? Levofloxacin Other (See Comments)  ? fatigue  ? Stadol [butorphanol Tartrate] Other (See Comments)  ? ALTERED MENTAL STATUS  ? Sulfa Antibiotics Rash  ? ?  ? ?  ?Medication List  ? ? ?acetaminophen 500 MG tablet ?Commonly known as: TYLENOL ?Take 1,000 mg by mouth every 6 (six) hours as needed for moderate pain. ?  ?ALIGN PO ?Take 1 tablet by mouth daily. ?  ?Biotin 5 MG Tabs ?Take 5 mg by mouth daily. ?  ?buPROPion 200 MG 12 hr tablet ?Commonly known as: WELLBUTRIN SR ?Take 1 tablet by mouth 2 times daily. (need office visit for refills) ?  ?cyclobenzaprine 5 MG tablet ?Commonly known as: FLEXERIL ?Take 1 tablet (5 mg total) by mouth 3 (three) times daily as needed for muscle spasms. ?  ?EQL Calcium Gummies 250-10 MG-MCG Chew ?Generic drug: Ca Phosphate-Cholecalciferol ?Chew 1 tablet by mouth 2 (two) times daily. ?  ?escitalopram 20 MG tablet ?Commonly known as: LEXAPRO ?TAKE 1 TABLET BY MOUTH ONCE A DAY ?  ?gabapentin 300 MG capsule ?Commonly known as: NEURONTIN ?Take 1 capsule (300 mg total) by mouth 2 (two) times daily. ?  ?HYDROmorphone 2 MG tablet ?Commonly known as: Dilaudid ?Take 1 tablet by mouth every 4 (four) hours as needed for severe pain. ?  ?lidocaine-prilocaine cream ?Commonly known as: EMLA ?Apply topically as needed. ?  ?LORazepam 0.5 MG tablet ?Commonly known as: ATIVAN ?Take 1 tablet (0.5 mg total) by mouth 2 (two) times daily as needed for anxiety. ?  ?magic mouthwash (nystatin, lidocaine, diphenhydrAMINE, alum & mag hydroxide) suspension ?Swish and spit 5 mls 4 times a day. ?  ?metFORMIN 500 MG tablet ?Commonly known as: GLUCOPHAGE ?Take 1 tablet by mouth every morning and 2 tablets every evening as directed (need office visit for refills) ?  ?mupirocin ointment 2 % ?Commonly known as: BACTROBAN ?Apply to the inside of each nostril twice daily for five (5) days. After application, press sides of nose together and gently massage. ?  ?Myrbetriq 50 MG Tb24 tablet ?Generic drug:  mirabegron ER ?Take 1 tablet by mouth once daily ?  ?omeprazole 20 MG capsule ?Commonly known as: PRILOSEC ?Take 1 capsule by mouth once a day ?  ?ondansetron 8 MG tablet ?Commonly known as: ZOFRAN ?Take 1 tablet (8 mg total) by mouth every 8 (eight) hours as needed for nausea or vomiting. ?  ?Prenatal Gummies 0.18-25 MG Chew ?Chew 1 capsule by mouth daily. ?  ?prochlorperazine 10 MG tablet ?Commonly known as: COMPAZINE ?Take 1 tablet by mouth every 6 hours as needed for nausea or vomiting. ?  ?SYSTANE OP ?Place  1 drop into both eyes daily as needed (dry eyes). ?  ?TAXOL IV ?Inject into the vein. ?  ?Xarelto Starter Pack ?Generic drug: Rivaroxaban Stater Pack (15 mg and 20 mg) ?Follow package directions: Take one '15mg'$  tablet by mouth twice a day. On day 22, switch to one '20mg'$  tablet once a day. Take with food. ?  ? ? ?Review of systems negative except as noted in HPI / PMHx or noted below: ? ?Review of Systems  ?Constitutional: Negative.   ?HENT: Negative.    ?Eyes: Negative.   ?Respiratory: Negative.    ?Cardiovascular: Negative.   ?Gastrointestinal: Negative.   ?Genitourinary: Negative.   ?Musculoskeletal: Negative.   ?Skin: Negative.   ?Neurological: Negative.   ?Endo/Heme/Allergies: Negative.   ?Psychiatric/Behavioral: Negative.    ? ?Family History  ?Problem Relation Age of Onset  ? Basal cell carcinoma Mother   ?     16s  ? Thyroid disease Mother   ? Stroke Father   ? Colon cancer Father 25  ? Hypertension Father   ? Heart disease Father   ? Colon cancer Paternal Uncle   ?     dx 31s  ? Colon cancer Cousin   ?     maternal cousin; dx 78s  ? ? ?Social History  ? ?Socioeconomic History  ? Marital status: Married  ?  Spouse name: Quentina Fronek  ? Number of children: 2  ? Years of education: college  ? Highest education level: Not on file  ?Occupational History  ? Occupation: Therapist, sports  ?Tobacco Use  ? Smoking status: Never  ? Smokeless tobacco: Never  ?Vaping Use  ? Vaping Use: Never used  ?Substance and Sexual Activity  ?  Alcohol use: Not Currently  ? Drug use: No  ? Sexual activity: Not Currently  ?Other Topics Concern  ? Not on file  ?Social History Narrative  ? Lives at home with husband.  ? Right-handed.  ? One cup caff

## 2021-07-12 ENCOUNTER — Encounter (INDEPENDENT_AMBULATORY_CARE_PROVIDER_SITE_OTHER): Payer: Self-pay | Admitting: Family Medicine

## 2021-07-12 ENCOUNTER — Encounter: Payer: Self-pay | Admitting: Allergy and Immunology

## 2021-07-12 ENCOUNTER — Encounter: Payer: Self-pay | Admitting: Hematology and Oncology

## 2021-07-12 NOTE — Progress Notes (Signed)
? ? ? ?Chief Complaint:  ? ?OBESITY ?[name] is here to discuss her progress with her obesity treatment plan along with follow-up of her obesity related diagnoses. [name] is on keeping a food journal and adhering to recommended goals of 1200 calories and 80+ grams of protein daily and states she is following her eating plan approximately 50% of the time. [name] states she is doing 0 minutes 0 times per week. ? ?Today's visit was #: 28 ?Starting weight: 209 lbs ?Starting date: 04/29/2018 ?Today's weight: 188 lbs ?Today's date: 07/09/2021 ?Total lbs lost to date: 21 ?Total lbs lost since last in-office visit: 3 ? ?Interim History: [name] continues to do well with weight loss. She has a high level of stress with her health and her mother is in Hospice, and meal planning ha been especially difficult. She is doing more grab and go eating due to this. ? ?Subjective:  ? ?1. Hyperglycemia ?[name] is stable on metformin. She is working on her diet and weight loss. No side effects were noted. ? ?2. Other fatigue ?[name] is due to have some labs checked. Her fatigue is worsened as she has extra stress with her mother's failing health. She has a CMP and CBC due to tomorrow.  ? ?3. History of vitamin D deficiency ?[name] has taken Vit D, but she is not On Vit D currently. She has no recent labs. ? ?4. Other depression, with emotional eating ?[name] is on medications, and she is doing well with decreasing emotional eating behaviors. She has a lot of stress recently. ? ?Assessment/Plan:  ? ?1. Hyperglycemia ?We will check labs today. [name] will continue metformin, and we will refill for 1 month. ? ?- metFORMIN (GLUCOPHAGE) 500 MG tablet; Take 1 tablet by mouth every morning and 2 tablets every evening as directed (need office visit for refills)  Dispense: 90 tablet; Refill: 0 ?- Lipid Panel With LDL/HDL Ratio ?- Insulin, random ?- Hemoglobin A1c ? ?2. Other fatigue ?We will check labs today, and will follow up at [name]'s next visit. Will  will order CMP and CBC so she can do all of her labs today. ?- CMP14+EGFR ?- CBC with Differential/Platelet ?- TSH ? ?3. History of vitamin D deficiency ?We will check labs today, and will follow up at [name]'s next visit. ? ?- VITAMIN D 25 Hydroxy (Vit-D Deficiency, Fractures) ? ?4. Other depression, with emotional eating ?We will refill Wellbutrin SR for 1 month. Behavior modification techniques were discussed today to help [name] deal with her emotional/non-hunger eating behaviors.  Orders and follow up as documented in patient record.  ? ?- buPROPion (WELLBUTRIN SR) 200 MG 12 hr tablet; Take 1 tablet by mouth 2 times daily. (need office visit for refills)  Dispense: 60 tablet; Refill: 0 ? ?5. Obesity with current BMI of 30.4 ?[name] is currently in the action stage of change. As such, her goal is to continue with weight loss efforts. She has agreed to keeping a food journal and adhering to recommended goals of 1200 calories and 80+ grams of protein daily.  ? ?[name] was given eating out handout to help her make better choices. ? ?Exercise goals: Look at averaging daily steps.  ? ?Behavioral modification strategies: increasing lean protein intake and no skipping meals. ? ?[name] has agreed to follow-up with our clinic in 3 weeks. She was informed of the importance of frequent follow-up visits to maximize her success with intensive lifestyle modifications for her multiple health conditions.  ? ?[name] was informed we would discuss  her lab results at her next visit unless there is a critical issue that needs to be addressed sooner. [name] agreed to keep her next visit at the agreed upon time to discuss these results. ? ?Objective:  ? ?Blood pressure 110/69, pulse (!) 59, temperature 97.9 ?F (36.6 ?C), height _0  (1.676 m), weight 188 lb (85.3 kg), SpO2 98 %. ?Body mass index is 30.34 kg/m?. ? ?General: Cooperative, alert, well developed, in no acute distress. ?HEENT: Conjunctivae and lids unremarkable. ?Cardiovascular:  Regular rhythm.  ?Lungs: Normal work of breathing. ?Neurologic: No focal deficits.  ? ?Lab Results  ?Component Value Date  ? CREATININE 1.15 (H) 07/09/2021  ? BUN 20 07/09/2021  ? NA 140 07/09/2021  ? K 4.7 07/09/2021  ? CL 104 07/09/2021  ? CO2 25 07/09/2021  ? ?Lab Results  ?Component Value Date  ? ALT 17 07/09/2021  ? AST 17 07/09/2021  ? ALKPHOS 78 07/09/2021  ? BILITOT 0.3 07/09/2021  ? ?Lab Results  ?Component Value Date  ? HGBA1C 5.4 07/09/2021  ? HGBA1C 5.2 11/30/2019  ? HGBA1C 5.3 05/31/2019  ? HGBA1C 5.2 11/19/2018  ? HGBA1C 5.5 04/29/2018  ? ?Lab Results  ?Component Value Date  ? INSULIN 17.1 07/09/2021  ? INSULIN 23.8 11/30/2019  ? INSULIN 16.5 05/31/2019  ? INSULIN 17.6 11/19/2018  ? INSULIN 25.8 (H) 04/29/2018  ? ?Lab Results  ?Component Value Date  ? TSH 0.679 07/09/2021  ? ?Lab Results  ?Component Value Date  ? CHOL 255 (H) 07/09/2021  ? HDL 50 07/09/2021  ? LDLCALC 182 (H) 07/09/2021  ? TRIG 126 07/09/2021  ? ?Lab Results  ?Component Value Date  ? VD25OH 38.6 07/09/2021  ? VD25OH 59.0 11/30/2019  ? VD25OH 54.0 05/31/2019  ? ?Lab Results  ?Component Value Date  ? WBC 4.0 07/09/2021  ? HGB 13.4 07/09/2021  ? HCT 38.9 07/09/2021  ? MCV 91 07/09/2021  ? PLT 349 07/09/2021  ? ?No results found for: IRON, TIBC, FERRITIN ? ?Attestation Statements:  ? ?Reviewed by clinician on day of visit: allergies, medications, problem list, medical history, surgical history, family history, social history, and previous encounter notes. ? ? ?I, Trixie Dredge, am acting as transcriptionist for Dennard Nip, MD. ? ?I have reviewed the above documentation for accuracy and completeness, and I agree with the above. -  Dennard Nip, MD ? ? ?

## 2021-07-13 ENCOUNTER — Inpatient Hospital Stay: Payer: 59 | Admitting: Hematology and Oncology

## 2021-07-13 ENCOUNTER — Other Ambulatory Visit (HOSPITAL_COMMUNITY): Payer: Self-pay

## 2021-07-13 ENCOUNTER — Inpatient Hospital Stay: Payer: 59

## 2021-07-13 NOTE — Telephone Encounter (Signed)
Her 4/10 appt has been cancelled.  Whoever replies back to her, please remind her that she has 6 months from last seen visit if she should change her mind.  Thank you

## 2021-07-16 ENCOUNTER — Other Ambulatory Visit: Payer: Self-pay

## 2021-07-16 ENCOUNTER — Telehealth: Payer: Self-pay

## 2021-07-16 ENCOUNTER — Other Ambulatory Visit (HOSPITAL_COMMUNITY): Payer: Self-pay

## 2021-07-16 MED ORDER — MAGIC MOUTHWASH W/LIDOCAINE: 5.0000 mL | Freq: Four times a day (QID) | ORAL | 0 refills | Status: DC

## 2021-07-16 MED ORDER — NYSTATIN 100000 UNIT/ML MT SUSP
OROMUCOSAL | 0 refills | Status: DC
  Filled 2021-07-16: qty 240, 12d supply, fill #0

## 2021-07-16 NOTE — Telephone Encounter (Signed)
yes

## 2021-07-16 NOTE — Telephone Encounter (Signed)
She called and left a message requesting refill on magic mouth wash without lidocaine. The right of her tongue hurts. No sores seen. ? ?Okay to send Rx? ?

## 2021-07-16 NOTE — Telephone Encounter (Signed)
Rx called into [facility] out patient pharmacy. Donette notified Rx sent. ?

## 2021-07-17 ENCOUNTER — Ambulatory Visit: Payer: 59

## 2021-07-17 ENCOUNTER — Inpatient Hospital Stay: Payer: 59

## 2021-07-17 ENCOUNTER — Telehealth: Payer: Self-pay

## 2021-07-17 NOTE — Telephone Encounter (Signed)
Received a message to cancel today's appts. Appts canceled. ?

## 2021-07-20 ENCOUNTER — Ambulatory Visit: Payer: 59

## 2021-07-20 ENCOUNTER — Other Ambulatory Visit: Payer: 59

## 2021-07-30 ENCOUNTER — Ambulatory Visit (INDEPENDENT_AMBULATORY_CARE_PROVIDER_SITE_OTHER): Payer: 59 | Admitting: Family Medicine

## 2021-07-31 ENCOUNTER — Inpatient Hospital Stay: Payer: 59

## 2021-07-31 ENCOUNTER — Other Ambulatory Visit: Payer: Self-pay

## 2021-07-31 ENCOUNTER — Encounter: Payer: Self-pay | Admitting: Hematology and Oncology

## 2021-07-31 ENCOUNTER — Inpatient Hospital Stay: Payer: 59 | Attending: Hematology | Admitting: Hematology and Oncology

## 2021-07-31 DIAGNOSIS — C786 Secondary malignant neoplasm of retroperitoneum and peritoneum: Secondary | ICD-10-CM | POA: Insufficient documentation

## 2021-07-31 DIAGNOSIS — N183 Chronic kidney disease, stage 3 unspecified: Secondary | ICD-10-CM

## 2021-07-31 DIAGNOSIS — T451X5A Adverse effect of antineoplastic and immunosuppressive drugs, initial encounter: Secondary | ICD-10-CM

## 2021-07-31 DIAGNOSIS — Z881 Allergy status to other antibiotic agents status: Secondary | ICD-10-CM

## 2021-07-31 DIAGNOSIS — Z5111 Encounter for antineoplastic chemotherapy: Secondary | ICD-10-CM | POA: Diagnosis not present

## 2021-07-31 DIAGNOSIS — G620 Drug-induced polyneuropathy: Secondary | ICD-10-CM | POA: Diagnosis not present

## 2021-07-31 DIAGNOSIS — C569 Malignant neoplasm of unspecified ovary: Secondary | ICD-10-CM

## 2021-07-31 LAB — CBC WITH DIFFERENTIAL (CANCER CENTER ONLY)
Abs Immature Granulocytes: 0.01 10*3/uL (ref 0.00–0.07)
Basophils Absolute: 0.1 10*3/uL (ref 0.0–0.1)
Basophils Relative: 1 %
Eosinophils Absolute: 0.2 10*3/uL (ref 0.0–0.5)
Eosinophils Relative: 4 %
HCT: 39.5 % (ref 36.0–46.0)
Hemoglobin: 13.5 g/dL (ref 12.0–15.0)
Immature Granulocytes: 0 %
Lymphocytes Relative: 24 %
Lymphs Abs: 1.2 10*3/uL (ref 0.7–4.0)
MCH: 31.6 pg (ref 26.0–34.0)
MCHC: 34.2 g/dL (ref 30.0–36.0)
MCV: 92.5 fL (ref 80.0–100.0)
Monocytes Absolute: 0.6 10*3/uL (ref 0.1–1.0)
Monocytes Relative: 12 %
Neutro Abs: 3 10*3/uL (ref 1.7–7.7)
Neutrophils Relative %: 59 %
Platelet Count: 289 10*3/uL (ref 150–400)
RBC: 4.27 MIL/uL (ref 3.87–5.11)
RDW: 13.2 % (ref 11.5–15.5)
WBC Count: 5 10*3/uL (ref 4.0–10.5)
nRBC: 0 % (ref 0.0–0.2)

## 2021-07-31 LAB — COMPREHENSIVE METABOLIC PANEL
ALT: 15 U/L (ref 0–44)
AST: 16 U/L (ref 15–41)
Albumin: 4.2 g/dL (ref 3.5–5.0)
Alkaline Phosphatase: 74 U/L (ref 38–126)
Anion gap: 6 (ref 5–15)
BUN: 27 mg/dL — ABNORMAL HIGH (ref 8–23)
CO2: 29 mmol/L (ref 22–32)
Calcium: 9.7 mg/dL (ref 8.9–10.3)
Chloride: 103 mmol/L (ref 98–111)
Creatinine, Ser: 1.08 mg/dL — ABNORMAL HIGH (ref 0.44–1.00)
GFR, Estimated: 57 mL/min — ABNORMAL LOW (ref >60)
Glucose, Bld: 90 mg/dL (ref 70–99)
Potassium: 4.3 mmol/L (ref 3.5–5.1)
Sodium: 138 mmol/L (ref 135–145)
Total Bilirubin: 0.4 mg/dL (ref 0.3–1.2)
Total Protein: 6.9 g/dL (ref 6.5–8.1)

## 2021-07-31 NOTE — Assessment & Plan Note (Signed)
She denies significant worsening peripheral neuropathy ?We discussed the use of cryo treatment ?

## 2021-07-31 NOTE — Assessment & Plan Note (Signed)
She was evaluated in allergy/immunology clinic ?I updated her medication allergy ?

## 2021-07-31 NOTE — Assessment & Plan Note (Signed)
Her renal function is stable Observe closely 

## 2021-07-31 NOTE — Assessment & Plan Note (Signed)
Her last imaging study shows stability ?We will continue single agent Taxol with dose modification and schedule modification per patient request ?I do not plan to repeat imaging study again until end of summer ?

## 2021-07-31 NOTE — Progress Notes (Signed)
[facility] ?OFFICE PROGRESS NOTE ? ?Patient Care Team: ?[name], MD as PCP - General (Internal Medicine) ? 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 ?Peripheral neuropathy due to chemotherapy [facility]) ?She denies significant worsening peripheral neuropathy ?We discussed the use of cryo treatment ? ?CKD (chronic kidney disease), stage III ?Her renal function is stable ?Observe closely ? 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 ?No orders of the defined types were placed in this encounter. ? ? ?All questions were answered. The patient knows to call the clinic with any problems, questions or concerns. ?The total time spent in the appointment was 20 minutes encounter with patients including review of chart and various tests results, discussions about plan of care and coordination of care plan ?  ?Heath Lark, MD ?07/31/2021 10:18 AM ? ?INTERVAL HISTORY: ?Please see below for problem oriented charting. ?she returns for treatment follow-up prior to chemotherapy ?She denies further neck pain from recent IJ clot ?Neuropathy is stable ?Denies abdominal pain or nausea ? ?REVIEW OF SYSTEMS:   ?Constitutional: Denies fevers, chills or abnormal weight loss ?Eyes: Denies blurriness of vision ?Ears, nose, mouth, throat, and face: Denies mucositis or sore throat ?Respiratory: Denies cough, dyspnea or wheezes ?Cardiovascular: Denies palpitation, chest discomfort or lower extremity swelling ?Gastrointestinal:  Denies nausea, heartburn or change in bowel habits ?Skin: Denies abnormal skin rashes ?Lymphatics: Denies new lymphadenopathy or easy bruising ?Behavioral/Psych: Mood is stable, no new changes  ?All  other systems were reviewed with the patient and are negative. ? ?I have reviewed the past medical history, past surgical history, social history and family history with the patient and they are unchanged from previous note. ? ?ALLERGIES:  is allergic to codeine, thimerosal, ciprofloxacin, daypro [oxaprozin], levofloxacin, stadol [butorphanol tartrate], and sulfa antibiotics. ? ?MEDICATIONS:  ?Current Outpatient Medications  ?Medication Sig Dispense Refill  ? aspirin EC 81 MG tablet Take 81 mg by mouth daily. Swallow whole.    ? acetaminophen (TYLENOL) 500 MG tablet Take 1,000 mg by mouth every 6 (six) hours as needed for moderate pain.    ? Biotin 5 MG TABS Take 5 mg by mouth daily.    ? buPROPion (WELLBUTRIN SR) 200 MG 12 hr tablet Take 1 tablet by mouth 2 times daily. (need office visit for refills) 60 tablet 0  ? Ca Phosphate-Cholecalciferol (EQL CALCIUM GUMMIES) 250-400 MG-UNIT CHEW Chew 1 tablet by mouth 2 (two) times daily.    ? cyclobenzaprine (FLEXERIL) 5 MG tablet Take 1 tablet (5 mg total) by mouth 3 (three) times daily as needed for muscle spasms. 20 tablet 0  ? escitalopram (LEXAPRO) 20 MG tablet TAKE 1 TABLET BY MOUTH ONCE A DAY 90 tablet 3  ? gabapentin (NEURONTIN) 300 MG capsule Take 1 capsule (300 mg total) by mouth 2 (two) times daily. 60 capsule 3  ? HYDROmorphone (DILAUDID) 2 MG tablet Take 1 tablet by mouth every 4 (four) hours as needed for severe pain. (Patient not taking: Reported on 07/11/2021) 30 tablet 0  ? LORazepam (ATIVAN) 0.5 MG tablet Take 1 tablet (0.5 mg total) by mouth 2 (two) times daily as needed for anxiety. 30 tablet 0  ? magic mouthwash w/lidocaine SOLN Take 5 mLs by mouth 4 (four)  times daily. 5 mls by mouth 4 times a day, swish and spit. No lidocaine. 240 mL 0  ? metFORMIN (GLUCOPHAGE) 500 MG tablet Take 1 tablet by mouth every morning and 2 tablets every evening as directed (need office visit for refills) 90 tablet 0  ? mirabegron ER (MYRBETRIQ) 50 MG TB24 tablet Take 1  tablet by mouth once daily 90 tablet 3  ? mupirocin ointment (BACTROBAN) 2 % Apply to the inside of each nostril twice daily for five (5) days. After application, press sides of nose together and gently massage. (Patient taking differently: Place 1 application. into the nose 2 (two) times daily as needed (nose sores).) 22 g 1  ? omeprazole (PRILOSEC) 20 MG capsule Take 1 capsule by mouth once a day 90 capsule 3  ? ondansetron (ZOFRAN) 8 MG tablet Take 1 tablet (8 mg total) by mouth every 8 (eight) hours as needed for nausea or vomiting. 60 tablet 0  ? PACLitaxel (TAXOL IV) Inject into the vein.    ? Polyethyl Glycol-Propyl Glycol (SYSTANE OP) Place 1 drop into both eyes daily as needed (dry eyes).    ? Prenatal MV & Min w/FA-DHA (PRENATAL GUMMIES) 0.18-25 MG CHEW Chew 1 capsule by mouth daily.    ? Probiotic Product (ALIGN PO) Take 1 tablet by mouth daily.     ? prochlorperazine (COMPAZINE) 10 MG tablet Take 1 tablet by mouth every 6 hours as needed for nausea or vomiting. 30 tablet 1  ? ?No current facility-administered medications for this visit.  ? ? ?SUMMARY OF ONCOLOGIC HISTORY: ?Oncology History Overview Note  ?2018 - Core biopsy for ER/PR and Foundation One testing performed. Unfortunately, no sufficient tissue for Foundation One testing. ER was 50% and PR 90%. ?Progressed on carboplatin, exemestane, Avastin,Tamoxifen/Megace and letrozole, mixed response on Lupron ? ?AM[facility]: ?06/23/19: 108 ?05/26/19: 9.04 ?01/05/19: 6.84 ?09/02/18: 3.72 ?06/01/18: 3.84 ?02/24/18: 2.6 ?11/21/17: 3.26 ?08/26/17: 1.77 ?05/27/17: 2.12 ?01/28/17: 2.47 ?06/10/16: 2.68 ?02/06/16: 1.84 ?05/12/15: 3.27 ?10/03/14: 2.12 ? ?Inhibin B ?09/22/2019: 95.9 ?05/26/19: 90.2 ?01/05/19: 92 ?09/02/18: 70.9 ?06/04/18: 70 ?02/24/18: 79.5 ?11/21/17: 85.8 ?08/26/17: 65.6 ?05/27/17: 58.9 ?01/28/17: 198 ?06/10/16: 118.1 ?02/06/16: 62.4 ?05/12/15: 64.6 ?10/03/14: 58 ?  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 1994 ?  ?2002 Relapse/Recurrence  ? Upper  abdominal recurrence, resected.  ?  ? - 09/2000 Chemotherapy  ? 6 cycles of IP cisplatin and etoposide ? ?  ?02/2009 Relapse/Recurrence  ? CT showed increased size of nodules in pelvis ?  ?2010 Surgery  ? Exlap with section of tumor nodules near cecum and left pelvic sidewall. ?Tumor: ER negative, PR positive ?  ? Treatment Plan Change  ? Alternated 2 week courses of Megace and Tamoxifen - ended 03/2012 ?  ?03/2012 PET scan  ? CT - progressive disease ?  ?2013 Treatment Plan Change  ? Letrozole ?  ?01/2016 Imaging  ? MRI showed progressive disease ?  ?2017 Treatment Plan Change  ? Two weeks of alternating Tamoxifen '20mg'$  daily and then Megace '40mg'$  TID ?  ?08/2016 Imaging  ? MRI showed progressive disease with peritoneal implants near liver, in pelvis ?  ?01/2017 Treatment Plan Change  ? Lupron 11.25 q 3 months ?  ?11/2017 Imaging  ? Overall mixed response. ?  ?Mixed cystic/solid lesions in the left pelvis are mildly improved. ?  ?Cystic peritoneal disease, including the dominant lesion along the posterior right hepatic lobe, is mildly progressed. ?  ?Subcapsular lesion along the posterior right hepatic lobe  is unchanged. ?  ?03/2018 Imaging  ? MRI A/P: ?Mixed response of individual peritoneal metastases in the pelvis, as described above. Overall, there has been no significant change in bulk of disease. ?  ?Stable cystic peritoneal metastatic disease along the capsular surfaces of the liver and spleen. ?  ?No new sites of metastatic disease identified within the abdomen or pelvis. ?  ?08/2018 Imaging  ? MRI A/P: ?Status post hysterectomy and bilateral salpingo-oophorectomy. ?  ?Mixed cystic/solid peritoneal implants in the abdomen/pelvis, as above. Dominant cystic implant along the posterior liver surface is mildly increased. Remaining lesions are overall grossly unchanged. ?  ?No new lesions are identified. ?  ?01/28/2019 Imaging  ? Mri A/P: ?1. Relatively similar appearance of peritoneal metastasis. A posterior right  hepatic capsular based lesion is similar to minimally decreased in size. Left pelvic implants are primarily similar with possible enlargement of an anterior high left pelvic cystic implant. No new disease identified. ?

## 2021-08-02 ENCOUNTER — Inpatient Hospital Stay: Payer: 59

## 2021-08-02 ENCOUNTER — Other Ambulatory Visit (HOSPITAL_COMMUNITY): Payer: Self-pay

## 2021-08-02 ENCOUNTER — Other Ambulatory Visit: Payer: Self-pay

## 2021-08-02 VITALS — BP 126/71 | HR 70 | Temp 97.8°F | Resp 17

## 2021-08-02 DIAGNOSIS — Z7189 Other specified counseling: Secondary | ICD-10-CM

## 2021-08-02 DIAGNOSIS — C569 Malignant neoplasm of unspecified ovary: Secondary | ICD-10-CM | POA: Diagnosis not present

## 2021-08-02 DIAGNOSIS — N183 Chronic kidney disease, stage 3 unspecified: Secondary | ICD-10-CM | POA: Diagnosis not present

## 2021-08-02 DIAGNOSIS — Z5111 Encounter for antineoplastic chemotherapy: Secondary | ICD-10-CM | POA: Diagnosis not present

## 2021-08-02 DIAGNOSIS — C786 Secondary malignant neoplasm of retroperitoneum and peritoneum: Secondary | ICD-10-CM | POA: Diagnosis not present

## 2021-08-02 DIAGNOSIS — G620 Drug-induced polyneuropathy: Secondary | ICD-10-CM | POA: Diagnosis not present

## 2021-08-02 MED ORDER — DEXAMETHASONE SODIUM PHOSPHATE 10 MG/ML IJ SOLN
5.0000 mg | Freq: Once | INTRAMUSCULAR | Status: AC
  Administered 2021-08-02: 5 mg via INTRAVENOUS
  Filled 2021-08-02: qty 1

## 2021-08-02 MED ORDER — SODIUM CHLORIDE 0.9 % IV SOLN: Freq: Once | INTRAVENOUS | Status: AC

## 2021-08-02 MED ORDER — SODIUM CHLORIDE 0.9 % IV SOLN
60.0000 mg/m2 | Freq: Once | INTRAVENOUS | Status: AC
  Administered 2021-08-02: 120 mg via INTRAVENOUS
  Filled 2021-08-02: qty 20

## 2021-08-02 MED ORDER — FAMOTIDINE IN NACL 20-0.9 MG/50ML-% IV SOLN
20.0000 mg | Freq: Once | INTRAVENOUS | Status: AC
  Administered 2021-08-02: 20 mg via INTRAVENOUS
  Filled 2021-08-02: qty 50

## 2021-08-02 MED ORDER — DIPHENHYDRAMINE HCL 50 MG/ML IJ SOLN
25.0000 mg | Freq: Once | INTRAMUSCULAR | Status: AC
  Administered 2021-08-02: 25 mg via INTRAVENOUS
  Filled 2021-08-02: qty 1

## 2021-08-02 NOTE — Patient Instructions (Signed)
[facility]  Discharge Instructions: ?Thank you for choosing [facility] to provide your oncology and hematology care.  ? ?If you have a lab appointment with the [facility], please go directly to the [facility] and check in at the registration area. ?  ?Wear comfortable clothing and clothing appropriate for easy access to any Portacath or PICC line.  ? ?We strive to give you quality time with your provider. You may need to reschedule your appointment if you arrive late (15 or more minutes).  Arriving late affects you and other patients whose appointments are after yours.  Also, if you miss three or more appointments without notifying the office, you may be dismissed from the clinic at the provider?s discretion.    ?  ?For prescription refill requests, have your pharmacy contact our office and allow 72 hours for refills to be completed.   ? ?Today you received the following chemotherapy and/or immunotherapy agents: Taxol    ?  ?To help prevent nausea and vomiting after your treatment, we encourage you to take your nausea medication as directed. ? ?BELOW ARE SYMPTOMS THAT SHOULD BE REPORTED IMMEDIATELY: ?*FEVER GREATER THAN 100.4 F (38 ?C) OR HIGHER ?*CHILLS OR SWEATING ?*NAUSEA AND VOMITING THAT IS NOT CONTROLLED WITH YOUR NAUSEA MEDICATION ?*UNUSUAL SHORTNESS OF BREATH ?*UNUSUAL BRUISING OR BLEEDING ?*URINARY PROBLEMS (pain or burning when urinating, or frequent urination) ?*BOWEL PROBLEMS (unusual diarrhea, constipation, pain near the anus) ?TENDERNESS IN MOUTH AND THROAT WITH OR WITHOUT PRESENCE OF ULCERS (sore throat, sores in mouth, or a toothache) ?UNUSUAL RASH, SWELLING OR PAIN  ?UNUSUAL VAGINAL DISCHARGE OR ITCHING  ? ?Items with * indicate a potential emergency and should be followed up as soon as possible or go to the Emergency Department if any problems should occur. ? ?Please show the CHEMOTHERAPY ALERT CARD or IMMUNOTHERAPY ALERT CARD at check-in to the  Emergency Department and triage nurse. ? ?Should you have questions after your visit or need to cancel or reschedule your appointment, please contact [facility]  Dept: [phone]  and follow the prompts.  Office hours are 8:00 a.m. to 4:30 p.m. Monday - Friday. Please note that voicemails left after 4:00 p.m. may not be returned until the following business day.  We are closed weekends and major holidays. You have access to a nurse at all times for urgent questions. Please call the main number to the clinic Dept: [phone] and follow the prompts. ? ? ?For any non-urgent questions, you may also contact your provider using MyChart. We now offer e-Visits for anyone 36 and older to request care online for non-urgent symptoms. For details visit [email or web address]. ?  ?Also download the MyChart app! Go to the app store, search "MyChart", open the app, select [facility], and log in with your MyChart username and password. ? ?Due to Covid, a mask is required upon entering the hospital/clinic. If you do not have a mask, one will be given to you upon arrival. For doctor visits, patients may have 1 support person aged 7 or older with them. For treatment visits, patients cannot have anyone with them due to current Covid guidelines and our immunocompromised population.  ? ?

## 2021-08-03 ENCOUNTER — Other Ambulatory Visit: Payer: Self-pay

## 2021-08-03 ENCOUNTER — Other Ambulatory Visit: Payer: Self-pay | Admitting: Hematology and Oncology

## 2021-08-03 DIAGNOSIS — C569 Malignant neoplasm of unspecified ovary: Secondary | ICD-10-CM

## 2021-08-07 ENCOUNTER — Inpatient Hospital Stay: Payer: 59

## 2021-08-07 ENCOUNTER — Other Ambulatory Visit (HOSPITAL_COMMUNITY): Payer: Self-pay

## 2021-08-08 ENCOUNTER — Other Ambulatory Visit (HOSPITAL_COMMUNITY): Payer: Self-pay

## 2021-08-08 ENCOUNTER — Encounter: Payer: Self-pay | Admitting: Allergy and Immunology

## 2021-08-08 ENCOUNTER — Ambulatory Visit: Payer: 59 | Admitting: Allergy and Immunology

## 2021-08-08 VITALS — BP 128/74 | HR 64 | Resp 20

## 2021-08-08 DIAGNOSIS — J340 Abscess, furuncle and carbuncle of nose: Secondary | ICD-10-CM

## 2021-08-08 MED ORDER — ESCITALOPRAM OXALATE 20 MG PO TABS
20.0000 mg | ORAL_TABLET | Freq: Every day | ORAL | 3 refills | Status: DC
  Filled 2021-08-08: qty 90, 90d supply, fill #0
  Filled 2021-11-02: qty 90, 90d supply, fill #1
  Filled 2022-02-10: qty 90, 90d supply, fill #2

## 2021-08-08 NOTE — Progress Notes (Signed)
? ?[facility] ? ? ?Follow-up Note ? ?Referring Provider: [name], MD ?Primary Provider: [name], MD ?Date of Office Visit: 08/08/2021 ? ?Subjective:  ? ?[name] (DOB: 1956-02-26) is a 66 y.o. female who returns to the [facility] on 08/08/2021 in re-evaluation of the following: ? ?HPI: [name] returns to this clinic in reevaluation of nasal septal ulcer with epistaxis, history of adverse drug effects, and history of dry eye syndrome. ? ?She is here today to assess her nasal septal ulceration identified during her last visit of 11 July 2021.  She feels as though her nose is really doing a lot better.  She has not noticed any bleeding.  She is now using her Bactroban applied to her left nasal airway twice a day. ? ?Allergies as of 08/08/2021   ? ?   Reactions  ? Codeine Nausea And Vomiting  ? Thimerosal Other (See Comments)  ? Eye redness   ? Ciprofloxacin Other (See Comments)  ? FATIGUE  ? Daypro [oxaprozin] Rash  ? Levofloxacin Other (See Comments)  ? fatigue  ? Stadol [butorphanol Tartrate] Other (See Comments)  ? ALTERED MENTAL STATUS  ? Sulfa Antibiotics Rash  ? ?  ? ?  ?Medication List  ? ? ?acetaminophen 500 MG tablet ?Commonly known as: TYLENOL ?Take 1,000 mg by mouth every 6 (six) hours as needed for moderate pain. ?  ?ALIGN PO ?Take 1 tablet by mouth daily. ?  ?aspirin EC 81 MG tablet ?Take 81 mg by mouth daily. Swallow whole. ?  ?Biotin 5 MG Tabs ?Take 5 mg by mouth daily. ?  ?buPROPion 200 MG 12 hr tablet ?Commonly known as: WELLBUTRIN SR ?Take 1 tablet by mouth 2 times daily. (need office visit for refills) ?  ?cyclobenzaprine 5 MG tablet ?Commonly known as: FLEXERIL ?Take 1 tablet (5 mg total) by mouth 3 (three) times daily as needed for muscle spasms. ?  ?EQL Calcium Gummies 250-10 MG-MCG Chew ?Generic drug: Ca Phosphate-Cholecalciferol ?Chew 1 tablet by mouth 2 (two) times daily. ?  ?escitalopram 20 MG tablet ?Commonly known  as: LEXAPRO ?TAKE 1 TABLET BY MOUTH ONCE A DAY ?  ?escitalopram 20 MG tablet ?Commonly known as: LEXAPRO ?Take 1 tablet (20 mg total) by mouth daily. ?  ?gabapentin 300 MG capsule ?Commonly known as: NEURONTIN ?Take 1 capsule (300 mg total) by mouth 2 (two) times daily. ?  ?HYDROmorphone 2 MG tablet ?Commonly known as: Dilaudid ?Take 1 tablet by mouth every 4 (four) hours as needed for severe pain. ?  ?LORazepam 0.5 MG tablet ?Commonly known as: ATIVAN ?Take 1 tablet (0.5 mg total) by mouth 2 (two) times daily as needed for anxiety. ?  ?metFORMIN 500 MG tablet ?Commonly known as: GLUCOPHAGE ?Take 1 tablet by mouth every morning and 2 tablets every evening as directed (need office visit for refills) ?  ?mupirocin ointment 2 % ?Commonly known as: BACTROBAN ?Apply to the inside of each nostril twice daily for five (5) days. After application, press sides of nose together and gently massage. ?  ?Myrbetriq 50 MG Tb24 tablet ?Generic drug: mirabegron ER ?Take 1 tablet by mouth once daily ?  ?omeprazole 20 MG capsule ?Commonly known as: PRILOSEC ?Take 1 capsule by mouth once a day ?  ?ondansetron 8 MG tablet ?Commonly known as: ZOFRAN ?Take 1 tablet (8 mg total) by mouth every 8 (eight) hours as needed for nausea or vomiting. ?  ?Prenatal Gummies 0.18-25 MG Chew ?  Chew 1 capsule by mouth daily. ?  ?prochlorperazine 10 MG tablet ?Commonly known as: COMPAZINE ?Take 1 tablet by mouth every 6 hours as needed for nausea or vomiting. ?  ?SYSTANE OP ?Place 1 drop into both eyes daily as needed (dry eyes). ?  ?TAXOL IV ?Inject into the vein. ?  ? ?Past Medical History:  ?Diagnosis Date  ? Anxiety   ? Arthritis   ? Cervical syndrome   ? CKD (chronic kidney disease), stage III ([place])   ? Constipation   ? COVID-19 03/26/2019  ? Family history of colon cancer 02/15/2020  ? Fatty liver   ? GERD (gastroesophageal reflux disease)   ? Granulosa cell tumor of ovary   ? History of hiatal hernia   ? Joint pain   ? Neck pain   ? Obesity   ?  Osteoarthritis   ? Ovarian cancer ([place])   ? PONV (postoperative nausea and vomiting)   ? history of n/v,  past surgeries no n/v  ? Sleep apnea   ? Tremor   ? ? ?Past Surgical History:  ?Procedure Laterality Date  ? ABDOMINAL HYSTERECTOMY    ? TAH/BSO  ? APPENDECTOMY    ? EXPLORATORY LAPAROTOMY    ? for bowel obstruction  ? IR IMAGING GUIDED PORT INSERTION  10/25/2020  ? IR REMOVAL TUN ACCESS W/ PORT W/O FL MOD SED  06/19/2021  ? left toe surgery Left   ? Secondary tumor debulking  2002  ? SHOULDER SURGERY    ? 2017 left  ? Tumor debulking  2010  ? VENTRAL HERNIA REPAIR    ? ? ?Review of systems negative except as noted in HPI / PMHx or noted below: ? ?Review of Systems  ?Constitutional: Negative.   ?HENT: Negative.    ?Eyes: Negative.   ?Respiratory: Negative.    ?Cardiovascular: Negative.   ?Gastrointestinal: Negative.   ?Genitourinary: Negative.   ?Musculoskeletal: Negative.   ?Skin: Negative.   ?Neurological: Negative.   ?Endo/Heme/Allergies: Negative.   ?Psychiatric/Behavioral: Negative.    ? ? ?Objective:  ? ?Vitals:  ? 08/08/21 0907  ?BP: 128/74  ?Pulse: 64  ?Resp: 20  ? ?   ?   ? ?Physical Exam ?HENT:  ?   Nose: No mucosal edema (No septal ulceration, eschar, erythema.).  ? ? ?Diagnostics: none ? ?Assessment and Plan:  ? ?1. Nasal septal ulcer   ? ?[name] is doing much better regarding her nasal septal ulcer and in fact there is no evidence of any inflammation affecting her nasal septum at this point in time and she can discontinue her Bactroban and just nasal saline followed by Vaseline when needed.  I have asked her to contact me should she develop problems with her nose in the future. ? ?Allena Katz, MD ?Allergy / Immunology ?[facility] ?

## 2021-08-08 NOTE — Patient Instructions (Addendum)
?  1.  If needed Nasal saline 3 times a day followed by Vaseline ? ? ? ?

## 2021-08-09 ENCOUNTER — Other Ambulatory Visit (HOSPITAL_COMMUNITY): Payer: Self-pay

## 2021-08-09 ENCOUNTER — Encounter: Payer: Self-pay | Admitting: Allergy and Immunology

## 2021-08-09 ENCOUNTER — Inpatient Hospital Stay: Payer: 59

## 2021-08-09 DIAGNOSIS — C569 Malignant neoplasm of unspecified ovary: Secondary | ICD-10-CM

## 2021-08-09 DIAGNOSIS — C786 Secondary malignant neoplasm of retroperitoneum and peritoneum: Secondary | ICD-10-CM | POA: Diagnosis not present

## 2021-08-09 DIAGNOSIS — G620 Drug-induced polyneuropathy: Secondary | ICD-10-CM | POA: Diagnosis not present

## 2021-08-09 DIAGNOSIS — Z5111 Encounter for antineoplastic chemotherapy: Secondary | ICD-10-CM | POA: Diagnosis not present

## 2021-08-09 DIAGNOSIS — N183 Chronic kidney disease, stage 3 unspecified: Secondary | ICD-10-CM | POA: Diagnosis not present

## 2021-08-09 LAB — CBC WITH DIFFERENTIAL (CANCER CENTER ONLY)
Abs Immature Granulocytes: 0.02 10*3/uL (ref 0.00–0.07)
Basophils Absolute: 0.1 10*3/uL (ref 0.0–0.1)
Basophils Relative: 1 %
Eosinophils Absolute: 0.3 10*3/uL (ref 0.0–0.5)
Eosinophils Relative: 5 %
HCT: 38.7 % (ref 36.0–46.0)
Hemoglobin: 12.9 g/dL (ref 12.0–15.0)
Immature Granulocytes: 0 %
Lymphocytes Relative: 39 %
Lymphs Abs: 2.1 10*3/uL (ref 0.7–4.0)
MCH: 31 pg (ref 26.0–34.0)
MCHC: 33.3 g/dL (ref 30.0–36.0)
MCV: 93 fL (ref 80.0–100.0)
Monocytes Absolute: 0.4 10*3/uL (ref 0.1–1.0)
Monocytes Relative: 8 %
Neutro Abs: 2.4 10*3/uL (ref 1.7–7.7)
Neutrophils Relative %: 47 %
Platelet Count: 315 10*3/uL (ref 150–400)
RBC: 4.16 MIL/uL (ref 3.87–5.11)
RDW: 13.2 % (ref 11.5–15.5)
WBC Count: 5.2 10*3/uL (ref 4.0–10.5)
nRBC: 0 % (ref 0.0–0.2)

## 2021-08-09 LAB — COMPREHENSIVE METABOLIC PANEL
ALT: 15 U/L (ref 0–44)
AST: 16 U/L (ref 15–41)
Albumin: 4.1 g/dL (ref 3.5–5.0)
Alkaline Phosphatase: 75 U/L (ref 38–126)
Anion gap: 5 (ref 5–15)
BUN: 29 mg/dL — ABNORMAL HIGH (ref 8–23)
CO2: 30 mmol/L (ref 22–32)
Calcium: 9.3 mg/dL (ref 8.9–10.3)
Chloride: 102 mmol/L (ref 98–111)
Creatinine, Ser: 1.03 mg/dL — ABNORMAL HIGH (ref 0.44–1.00)
GFR, Estimated: >60 mL/min (ref >60)
Glucose, Bld: 93 mg/dL (ref 70–99)
Potassium: 3.9 mmol/L (ref 3.5–5.1)
Sodium: 137 mmol/L (ref 135–145)
Total Bilirubin: 0.4 mg/dL (ref 0.3–1.2)
Total Protein: 7 g/dL (ref 6.5–8.1)

## 2021-08-10 ENCOUNTER — Inpatient Hospital Stay: Payer: 59

## 2021-08-10 VITALS — BP 129/83 | HR 66 | Temp 99.5°F | Resp 18 | Wt 192.8 lb

## 2021-08-10 DIAGNOSIS — C786 Secondary malignant neoplasm of retroperitoneum and peritoneum: Secondary | ICD-10-CM | POA: Diagnosis not present

## 2021-08-10 DIAGNOSIS — Z5111 Encounter for antineoplastic chemotherapy: Secondary | ICD-10-CM | POA: Diagnosis not present

## 2021-08-10 DIAGNOSIS — N183 Chronic kidney disease, stage 3 unspecified: Secondary | ICD-10-CM | POA: Diagnosis not present

## 2021-08-10 DIAGNOSIS — G620 Drug-induced polyneuropathy: Secondary | ICD-10-CM | POA: Diagnosis not present

## 2021-08-10 DIAGNOSIS — C569 Malignant neoplasm of unspecified ovary: Secondary | ICD-10-CM | POA: Diagnosis not present

## 2021-08-10 DIAGNOSIS — Z7189 Other specified counseling: Secondary | ICD-10-CM

## 2021-08-10 MED ORDER — FAMOTIDINE IN NACL 20-0.9 MG/50ML-% IV SOLN
20.0000 mg | Freq: Once | INTRAVENOUS | Status: AC
  Administered 2021-08-10: 20 mg via INTRAVENOUS
  Filled 2021-08-10: qty 50

## 2021-08-10 MED ORDER — SODIUM CHLORIDE 0.9 % IV SOLN
60.0000 mg/m2 | Freq: Once | INTRAVENOUS | Status: AC
  Administered 2021-08-10: 120 mg via INTRAVENOUS
  Filled 2021-08-10: qty 20

## 2021-08-10 MED ORDER — DIPHENHYDRAMINE HCL 50 MG/ML IJ SOLN
25.0000 mg | Freq: Once | INTRAMUSCULAR | Status: AC
  Administered 2021-08-10: 25 mg via INTRAVENOUS
  Filled 2021-08-10: qty 1

## 2021-08-10 MED ORDER — DEXAMETHASONE SODIUM PHOSPHATE 10 MG/ML IJ SOLN
5.0000 mg | Freq: Once | INTRAMUSCULAR | Status: AC
  Administered 2021-08-10: 5 mg via INTRAVENOUS
  Filled 2021-08-10: qty 1

## 2021-08-10 MED ORDER — SODIUM CHLORIDE 0.9 % IV SOLN: Freq: Once | INTRAVENOUS | Status: AC

## 2021-08-10 NOTE — Patient Instructions (Signed)
[facility]   Discharge Instructions: Thank you for choosing [facility] to provide your oncology and hematology care.   If you have a lab appointment with the Cancer Center, please go directly to the Cancer Center and check in at the registration area.   Wear comfortable clothing and clothing appropriate for easy access to any Portacath or PICC line.   We strive to give you quality time with your provider. You may need to reschedule your appointment if you arrive late (15 or more minutes).  Arriving late affects you and other patients whose appointments are after yours.  Also, if you miss three or more appointments without notifying the office, you may be dismissed from the clinic at the provider's discretion.      For prescription refill requests, have your pharmacy contact our office and allow 72 hours for refills to be completed.    Today you received the following chemotherapy and/or immunotherapy agents: paclitaxel.      To help prevent nausea and vomiting after your treatment, we encourage you to take your nausea medication as directed.  BELOW ARE SYMPTOMS THAT SHOULD BE REPORTED IMMEDIATELY: *FEVER GREATER THAN 100.4 F (38 C) OR HIGHER *CHILLS OR SWEATING *NAUSEA AND VOMITING THAT IS NOT CONTROLLED WITH YOUR NAUSEA MEDICATION *UNUSUAL SHORTNESS OF BREATH *UNUSUAL BRUISING OR BLEEDING *URINARY PROBLEMS (pain or burning when urinating, or frequent urination) *BOWEL PROBLEMS (unusual diarrhea, constipation, pain near the anus) TENDERNESS IN MOUTH AND THROAT WITH OR WITHOUT PRESENCE OF ULCERS (sore throat, sores in mouth, or a toothache) UNUSUAL RASH, SWELLING OR PAIN  UNUSUAL VAGINAL DISCHARGE OR ITCHING   Items with * indicate a potential emergency and should be followed up as soon as possible or go to the Emergency Department if any problems should occur.  Please show the CHEMOTHERAPY ALERT CARD or IMMUNOTHERAPY ALERT CARD at check-in  to the Emergency Department and triage nurse.  Should you have questions after your visit or need to cancel or reschedule your appointment, please contact [facility]  Dept: [phone]  and follow the prompts.  Office hours are 8:00 a.m. to 4:30 p.m. Monday - Friday. Please note that voicemails left after 4:00 p.m. may not be returned until the following business day.  We are closed weekends and major holidays. You have access to a nurse at all times for urgent questions. Please call the main number to the clinic Dept: [phone] and follow the prompts.   For any non-urgent questions, you may also contact your provider using MyChart. We now offer e-Visits for anyone 18 and older to request care online for non-urgent symptoms. For details visit [email or web address].   Also download the MyChart app! Go to the app store, search "MyChart", open the app, select [facility], and log in with your MyChart username and password.  Due to Covid, a mask is required upon entering the hospital/clinic. If you do not have a mask, one will be given to you upon arrival. For doctor visits, patients may have 1 support person aged 18 or older with them. For treatment visits, patients cannot have anyone with them due to current Covid guidelines and our immunocompromised population.   

## 2021-08-21 ENCOUNTER — Other Ambulatory Visit: Payer: 59

## 2021-08-21 ENCOUNTER — Ambulatory Visit: Payer: 59

## 2021-08-21 ENCOUNTER — Ambulatory Visit: Payer: 59 | Admitting: Hematology and Oncology

## 2021-08-22 ENCOUNTER — Encounter: Payer: Self-pay | Admitting: Hematology and Oncology

## 2021-08-22 ENCOUNTER — Inpatient Hospital Stay: Payer: 59

## 2021-08-23 DIAGNOSIS — Z760 Encounter for issue of repeat prescription: Secondary | ICD-10-CM | POA: Diagnosis not present

## 2021-08-28 ENCOUNTER — Other Ambulatory Visit: Payer: 59

## 2021-08-28 ENCOUNTER — Other Ambulatory Visit (HOSPITAL_COMMUNITY): Payer: Self-pay

## 2021-08-28 ENCOUNTER — Ambulatory Visit (INDEPENDENT_AMBULATORY_CARE_PROVIDER_SITE_OTHER): Payer: 59 | Admitting: Family Medicine

## 2021-08-28 ENCOUNTER — Encounter (INDEPENDENT_AMBULATORY_CARE_PROVIDER_SITE_OTHER): Payer: Self-pay | Admitting: Family Medicine

## 2021-08-28 ENCOUNTER — Ambulatory Visit: Payer: 59

## 2021-08-28 VITALS — BP 119/74 | HR 57 | Temp 97.8°F | Ht 66.0 in | Wt 193.0 lb

## 2021-08-28 DIAGNOSIS — E559 Vitamin D deficiency, unspecified: Secondary | ICD-10-CM

## 2021-08-28 DIAGNOSIS — Z6831 Body mass index (BMI) 31.0-31.9, adult: Secondary | ICD-10-CM

## 2021-08-28 DIAGNOSIS — Z6833 Body mass index (BMI) 33.0-33.9, adult: Secondary | ICD-10-CM

## 2021-08-28 DIAGNOSIS — E7849 Other hyperlipidemia: Secondary | ICD-10-CM | POA: Diagnosis not present

## 2021-08-28 DIAGNOSIS — E669 Obesity, unspecified: Secondary | ICD-10-CM

## 2021-08-28 DIAGNOSIS — Z9189 Other specified personal risk factors, not elsewhere classified: Secondary | ICD-10-CM

## 2021-08-28 DIAGNOSIS — F3289 Other specified depressive episodes: Secondary | ICD-10-CM | POA: Diagnosis not present

## 2021-08-28 DIAGNOSIS — R7303 Prediabetes: Secondary | ICD-10-CM | POA: Diagnosis not present

## 2021-08-28 MED ORDER — VITAMIN D (ERGOCALCIFEROL) 1.25 MG (50000 UNIT) PO CAPS
50000.0000 [IU] | ORAL_CAPSULE | ORAL | 0 refills | Status: DC
  Filled 2021-08-28: qty 12, 84d supply, fill #0

## 2021-08-28 MED ORDER — BUPROPION HCL ER (SR) 200 MG PO TB12
200.0000 mg | ORAL_TABLET | Freq: Two times a day (BID) | ORAL | 0 refills | Status: DC
  Filled 2021-08-28: qty 180, 90d supply, fill #0

## 2021-08-28 MED ORDER — METFORMIN HCL 500 MG PO TABS
ORAL_TABLET | ORAL | 0 refills | Status: DC
  Filled 2021-08-28: qty 270, 90d supply, fill #0

## 2021-08-28 NOTE — Patient Instructions (Signed)
The 10-year ASCVD risk score (Arnett DK, et al., 2019) is: 5.2% ?  Values used to calculate the score: ?    Age: 66 years ?    Sex: Female ?    Is Non-Hispanic African American: No ?    Diabetic: No ?    Tobacco smoker: No ?    Systolic Blood Pressure: 584 mmHg ?    Is BP treated: No ?    HDL Cholesterol: 50 mg/dL ?    Total Cholesterol: 255 mg/dL ? ?

## 2021-08-29 ENCOUNTER — Inpatient Hospital Stay: Payer: 59 | Attending: Hematology

## 2021-08-29 ENCOUNTER — Other Ambulatory Visit (HOSPITAL_COMMUNITY): Payer: Self-pay

## 2021-08-29 DIAGNOSIS — G620 Drug-induced polyneuropathy: Secondary | ICD-10-CM | POA: Diagnosis not present

## 2021-08-29 DIAGNOSIS — R110 Nausea: Secondary | ICD-10-CM | POA: Insufficient documentation

## 2021-08-29 DIAGNOSIS — Z5111 Encounter for antineoplastic chemotherapy: Secondary | ICD-10-CM | POA: Diagnosis not present

## 2021-08-29 DIAGNOSIS — C569 Malignant neoplasm of unspecified ovary: Secondary | ICD-10-CM | POA: Diagnosis not present

## 2021-08-29 DIAGNOSIS — C786 Secondary malignant neoplasm of retroperitoneum and peritoneum: Secondary | ICD-10-CM | POA: Insufficient documentation

## 2021-08-29 DIAGNOSIS — I208 Other forms of angina pectoris: Secondary | ICD-10-CM | POA: Diagnosis not present

## 2021-08-29 LAB — CBC WITH DIFFERENTIAL/PLATELET
Abs Immature Granulocytes: 0.01 10*3/uL (ref 0.00–0.07)
Basophils Absolute: 0 10*3/uL (ref 0.0–0.1)
Basophils Relative: 1 %
Eosinophils Absolute: 0.1 10*3/uL (ref 0.0–0.5)
Eosinophils Relative: 2 %
HCT: 39.6 % (ref 36.0–46.0)
Hemoglobin: 13 g/dL (ref 12.0–15.0)
Immature Granulocytes: 0 %
Lymphocytes Relative: 23 %
Lymphs Abs: 1.6 10*3/uL (ref 0.7–4.0)
MCH: 30.8 pg (ref 26.0–34.0)
MCHC: 32.8 g/dL (ref 30.0–36.0)
MCV: 93.8 fL (ref 80.0–100.0)
Monocytes Absolute: 0.9 10*3/uL (ref 0.1–1.0)
Monocytes Relative: 12 %
Neutro Abs: 4.5 10*3/uL (ref 1.7–7.7)
Neutrophils Relative %: 62 %
Platelets: 307 10*3/uL (ref 150–400)
RBC: 4.22 MIL/uL (ref 3.87–5.11)
RDW: 13.3 % (ref 11.5–15.5)
WBC: 7.2 10*3/uL (ref 4.0–10.5)
nRBC: 0 % (ref 0.0–0.2)

## 2021-08-29 LAB — COMPREHENSIVE METABOLIC PANEL
ALT: 17 U/L (ref 0–44)
AST: 16 U/L (ref 15–41)
Albumin: 4.1 g/dL (ref 3.5–5.0)
Alkaline Phosphatase: 75 U/L (ref 38–126)
Anion gap: 6 (ref 5–15)
BUN: 19 mg/dL (ref 8–23)
CO2: 31 mmol/L (ref 22–32)
Calcium: 9.3 mg/dL (ref 8.9–10.3)
Chloride: 99 mmol/L (ref 98–111)
Creatinine, Ser: 1.06 mg/dL — ABNORMAL HIGH (ref 0.44–1.00)
GFR, Estimated: 59 mL/min — ABNORMAL LOW (ref >60)
Glucose, Bld: 105 mg/dL — ABNORMAL HIGH (ref 70–99)
Potassium: 4.4 mmol/L (ref 3.5–5.1)
Sodium: 136 mmol/L (ref 135–145)
Total Bilirubin: 0.4 mg/dL (ref 0.3–1.2)
Total Protein: 7.1 g/dL (ref 6.5–8.1)

## 2021-08-30 ENCOUNTER — Other Ambulatory Visit: Payer: Self-pay

## 2021-08-30 ENCOUNTER — Inpatient Hospital Stay: Payer: 59

## 2021-08-30 ENCOUNTER — Inpatient Hospital Stay (HOSPITAL_BASED_OUTPATIENT_CLINIC_OR_DEPARTMENT_OTHER): Payer: 59 | Admitting: Hematology and Oncology

## 2021-08-30 ENCOUNTER — Other Ambulatory Visit (HOSPITAL_COMMUNITY): Payer: Self-pay

## 2021-08-30 ENCOUNTER — Encounter: Payer: Self-pay | Admitting: Hematology and Oncology

## 2021-08-30 VITALS — BP 138/66 | HR 54 | Temp 97.3°F | Resp 19 | Ht 66.0 in | Wt 198.7 lb

## 2021-08-30 DIAGNOSIS — I208 Other forms of angina pectoris: Secondary | ICD-10-CM

## 2021-08-30 DIAGNOSIS — C569 Malignant neoplasm of unspecified ovary: Secondary | ICD-10-CM

## 2021-08-30 DIAGNOSIS — G620 Drug-induced polyneuropathy: Secondary | ICD-10-CM

## 2021-08-30 DIAGNOSIS — R110 Nausea: Secondary | ICD-10-CM

## 2021-08-30 DIAGNOSIS — Z7189 Other specified counseling: Secondary | ICD-10-CM

## 2021-08-30 DIAGNOSIS — T451X5A Adverse effect of antineoplastic and immunosuppressive drugs, initial encounter: Secondary | ICD-10-CM

## 2021-08-30 DIAGNOSIS — Z5111 Encounter for antineoplastic chemotherapy: Secondary | ICD-10-CM | POA: Diagnosis not present

## 2021-08-30 DIAGNOSIS — C786 Secondary malignant neoplasm of retroperitoneum and peritoneum: Secondary | ICD-10-CM | POA: Diagnosis not present

## 2021-08-30 MED ORDER — DIPHENHYDRAMINE HCL 50 MG/ML IJ SOLN
25.0000 mg | Freq: Once | INTRAMUSCULAR | Status: AC
  Administered 2021-08-30: 25 mg via INTRAVENOUS
  Filled 2021-08-30: qty 1

## 2021-08-30 MED ORDER — GABAPENTIN 300 MG PO CAPS
300.0000 mg | ORAL_CAPSULE | Freq: Two times a day (BID) | ORAL | 3 refills | Status: DC
  Filled 2021-08-30: qty 60, 30d supply, fill #0
  Filled 2021-10-10: qty 60, 30d supply, fill #1
  Filled 2021-12-03: qty 60, 30d supply, fill #2
  Filled 2022-07-14: qty 60, 30d supply, fill #3

## 2021-08-30 MED ORDER — DEXAMETHASONE SODIUM PHOSPHATE 10 MG/ML IJ SOLN
5.0000 mg | Freq: Once | INTRAMUSCULAR | Status: AC
  Administered 2021-08-30: 5 mg via INTRAVENOUS
  Filled 2021-08-30: qty 1

## 2021-08-30 MED ORDER — SODIUM CHLORIDE 0.9 % IV SOLN
60.0000 mg/m2 | Freq: Once | INTRAVENOUS | Status: AC
  Administered 2021-08-30: 126 mg via INTRAVENOUS
  Filled 2021-08-30: qty 21

## 2021-08-30 MED ORDER — FAMOTIDINE IN NACL 20-0.9 MG/50ML-% IV SOLN
20.0000 mg | Freq: Once | INTRAVENOUS | Status: AC
  Administered 2021-08-30: 20 mg via INTRAVENOUS
  Filled 2021-08-30: qty 50

## 2021-08-30 MED ORDER — SODIUM CHLORIDE 0.9 % IV SOLN: Freq: Once | INTRAVENOUS | Status: AC

## 2021-08-30 MED ORDER — LORAZEPAM 0.5 MG PO TABS
0.5000 mg | ORAL_TABLET | Freq: Two times a day (BID) | ORAL | 0 refills | Status: DC | PRN
  Filled 2021-08-30: qty 30, 15d supply, fill #0

## 2021-08-30 NOTE — Assessment & Plan Note (Signed)
She has predictable signs and symptoms of chest pain when she walk up an incline ?I reviewed her CT imaging which showed signs of coronary calcification ?The patient is prediabetic, with family history of coronary artery disease and high cholesterol ?Recommend cardiology consult and she is in agreement ?

## 2021-08-30 NOTE — Progress Notes (Signed)
Jacqueline MosleyOFFICE PROGRESS NOTE ? ?Patient Care Team: ?[name], MD as PCP - General (Internal Medicine) ? 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 ?Stable angina ([place]) ?She has predictable signs and symptoms of chest pain when she walk up an incline ?I reviewed her CT imaging which showed signs of coronary calcification ?The patient is prediabetic, with family history of coronary artery disease and high cholesterol ?Recommend cardiology consult and she is in agreement ? ?Peripheral neuropathy due to chemotherapy [facility]) ?She denies significant worsening peripheral neuropathy ?We discussed the use of cryo treatment ?I refilled her prescription gabapentin ? ?Nausea without vomiting ?She continues to have intermittent nausea without vomiting ?She will continue antiemetics ? ?Orders Placed This Encounter  ?Procedures  ? CT ABDOMEN PELVIS W CONTRAST  ?  Standing Status:   Future  ?  Standing Expiration Date:   08/31/2022  ?  Order Specific Question:   If indicated for the ordered procedure, I authorize the administration of contrast media per Radiology protocol  ?  Answer:   Yes  ?  Order Specific Question:   Preferred imaging location?  ?  Answer:   [facility]  ?  Order Specific Question:   Radiology Contrast Protocol - do NOT remove file path  ?  Answer:   [email or web address]  ? Ambulatory referral to Cardiology  ?  Referral Priority:   Routine  ?  Referral Type:   Consultation  ?  Referral Reason:   Specialty Services Required  ?  Requested Specialty:   Cardiology  ?  Number of Visits Requested:   1  ? ? ?All questions were answered. The patient knows to call the clinic with any problems,  questions or concerns. ?The total time spent in the appointment was 40 minutes encounter with patients including review of chart and various tests results, discussions about plan of care and coordination of care plan ?  ?Heath Lark, MD ?08/30/2021 8:34 AM ? ?INTERVAL HISTORY: ?Please see below for problem oriented charting. ?she returns for treatment follow-up seen prior to chemotherapy ?Her neuropathy is about the same ?She has occasional rare intermittent nausea without vomiting ?Denies recent constipation or severe abdominal pain ?She has not used her pain medicine for a while ?She complained of intermittent chest tightness whenever she walks up an incline ?Denies diaphoresis, palpitation no syncopal episode ? ?REVIEW OF SYSTEMS:   ?Constitutional: Denies fevers, chills or abnormal weight loss ?Eyes: Denies blurriness of vision ?Ears, nose, mouth, throat, and face: Denies mucositis or sore throat ?Respiratory: Denies cough, dyspnea or wheezes ?Skin: Denies abnormal skin rashes ?Lymphatics: Denies new lymphadenopathy or easy bruising ?Behavioral/Psych: Mood is stable, no new changes  ?All other systems were reviewed with the patient and are negative. ? ?I have reviewed the past medical history, past surgical history, social history and family history with the patient and they are unchanged from previous note. ? ?ALLERGIES:  is allergic to codeine, thimerosal, ciprofloxacin, daypro [oxaprozin], levofloxacin, stadol [butorphanol tartrate], and sulfa antibiotics. ? ?MEDICATIONS:  ?Current Outpatient Medications  ?Medication Sig Dispense Refill  ? acetaminophen (TYLENOL) 500 MG tablet Take 1,000 mg by mouth every 6 (six) hours as needed for moderate pain.    ? aspirin EC 81 MG tablet Take 81 mg  by mouth daily. Swallow whole.    ? Biotin 5 MG TABS Take 5 mg by mouth daily.    ? buPROPion (WELLBUTRIN SR) 200 MG 12 hr tablet Take 1 tablet (200 mg total) by mouth 2 (two) times daily. 180 tablet 0  ? Ca  Phosphate-Cholecalciferol (EQL CALCIUM GUMMIES) 250-400 MG-UNIT CHEW Chew 1 tablet by mouth 2 (two) times daily.    ? escitalopram (LEXAPRO) 20 MG tablet Take 1 tablet (20 mg total) by mouth daily. 90 tablet 3  ? gabapentin (NEURONTIN) 300 MG capsule Take 1 capsule (300 mg total) by mouth 2 (two) times daily. 60 capsule 3  ? HYDROmorphone (DILAUDID) 2 MG tablet Take 1 tablet by mouth every 4 (four) hours as needed for severe pain. 30 tablet 0  ? LORazepam (ATIVAN) 0.5 MG tablet Take 1 tablet (0.5 mg total) by mouth 2 (two) times daily as needed for anxiety. 30 tablet 0  ? metFORMIN (GLUCOPHAGE) 500 MG tablet Take 1 tablet by mouth every morning and 2 tablets every evening as directed (need office visit for refills) 270 tablet 0  ? mirabegron ER (MYRBETRIQ) 50 MG TB24 tablet Take 1 tablet by mouth once daily 90 tablet 3  ? mupirocin ointment (BACTROBAN) 2 % Apply to the inside of each nostril twice daily for five (5) days. After application, press sides of nose together and gently massage. (Patient taking differently: Place 1 application. into the nose 2 (two) times daily as needed (nose sores).) 22 g 1  ? omeprazole (PRILOSEC) 20 MG capsule Take 1 capsule by mouth once a day 90 capsule 3  ? ondansetron (ZOFRAN) 8 MG tablet Take 1 tablet (8 mg total) by mouth every 8 (eight) hours as needed for nausea or vomiting. 60 tablet 0  ? PACLitaxel (TAXOL IV) Inject into the vein.    ? Polyethyl Glycol-Propyl Glycol (SYSTANE OP) Place 1 drop into both eyes daily as needed (dry eyes).    ? Prenatal MV & Min w/FA-DHA (PRENATAL GUMMIES) 0.18-25 MG CHEW Chew 1 capsule by mouth daily.    ? Probiotic Product (ALIGN PO) Take 1 tablet by mouth daily.     ? prochlorperazine (COMPAZINE) 10 MG tablet Take 1 tablet by mouth every 6 hours as needed for nausea or vomiting. 30 tablet 1  ? Vitamin D, Ergocalciferol, (DRISDOL) 1.25 MG (50000 UNIT) CAPS capsule Take 1 capsule (50,000 Units total) by mouth every 7 (seven) days. (Need Office  Visit) 12 capsule 0  ? ?No current facility-administered medications for this visit.  ? ?Facility-Administered Medications Ordered in Other Visits  ?Medication Dose Route Frequency Provider Last Rate Last Admin  ? 0.9 %  sodium chloride infusion   Intravenous Once Alvy Bimler, Tyion Boylen, MD      ? dexamethasone (DECADRON) injection 5 mg  5 mg Intravenous Once Alvy Bimler, Braya Habermehl, MD      ? diphenhydrAMINE (BENADRYL) injection 25 mg  25 mg Intravenous Once Alvy Bimler, Florence Yeung, MD      ? famotidine (PEPCID) IVPB 20 mg premix  20 mg Intravenous Once Alvy Bimler, Mohamad Bruso, MD      ? PACLitaxel (TAXOL) 126 mg in sodium chloride 0.9 % 250 mL chemo infusion (</= '80mg'$ /m2)  60 mg/m2 (Treatment Plan Recorded) Intravenous Once Heath Lark, MD      ? ? ?SUMMARY OF ONCOLOGIC HISTORY: ?Oncology History Overview Note  ?2018 - Core biopsy for ER/PR and Foundation One testing performed. Unfortunately, no sufficient tissue for Foundation One testing. ER was 50% and PR 90%. ?Progressed on carboplatin,  exemestane, Avastin,Tamoxifen/Megace and letrozole, mixed response on Lupron ? ?AMH: ?06/23/19: 108 ?05/26/19: 9.04 ?01/05/19: 6.84 ?09/02/18: 3.72 ?06/01/18: 3.84 ?02/24/18: 2.6 ?11/21/17: 3.26 ?08/26/17: 1.77 ?05/27/17: 2.12 ?01/28/17: 2.47 ?06/10/16: 2.68 ?02/06/16: 1.84 ?05/12/15: 3.27 ?10/03/14: 2.12 ? ?Inhibin B ?09/22/2019: 95.9 ?05/26/19: 90.2 ?01/05/19: 92 ?09/02/18: 70.9 ?06/04/18: 70 ?02/24/18: 79.5 ?11/21/17: 85.8 ?08/26/17: 65.6 ?05/27/17: 58.9 ?01/28/17: 198 ?06/10/16: 118.1 ?02/06/16: 62.4 ?05/12/15: 64.6 ?10/03/14: 58 ?  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 1994 ?  ?2002 Relapse/Recurrence  ? Upper abdominal recurrence, resected.  ?  ? - 09/2000 Chemotherapy  ? 6 cycles of IP cisplatin and etoposide ? ?  ?02/2009 Relapse/Recurrence  ? CT showed increased size of nodules in pelvis ?  ?2010 Surgery  ? Exlap with section of tumor nodules near cecum and left pelvic sidewall. ?Tumor: ER negative, PR positive ?  ? Treatment Plan Change  ? Alternated 2 week  courses of Megace and Tamoxifen - ended 03/2012 ?  ?03/2012 PET scan  ? CT - progressive disease ?  ?2013 Treatment Plan Change  ? Letrozole ?  ?01/2016 Imaging  ? MRI showed progressive disease ?  ?2017 Treatment Plan Change  ?

## 2021-08-30 NOTE — Patient Instructions (Signed)
[facility]   Discharge Instructions: Thank you for choosing [facility] to provide your oncology and hematology care.   If you have a lab appointment with the Cancer Center, please go directly to the Cancer Center and check in at the registration area.   Wear comfortable clothing and clothing appropriate for easy access to any Portacath or PICC line.   We strive to give you quality time with your provider. You may need to reschedule your appointment if you arrive late (15 or more minutes).  Arriving late affects you and other patients whose appointments are after yours.  Also, if you miss three or more appointments without notifying the office, you may be dismissed from the clinic at the provider's discretion.      For prescription refill requests, have your pharmacy contact our office and allow 72 hours for refills to be completed.    Today you received the following chemotherapy and/or immunotherapy agents: paclitaxel.      To help prevent nausea and vomiting after your treatment, we encourage you to take your nausea medication as directed.  BELOW ARE SYMPTOMS THAT SHOULD BE REPORTED IMMEDIATELY: *FEVER GREATER THAN 100.4 F (38 C) OR HIGHER *CHILLS OR SWEATING *NAUSEA AND VOMITING THAT IS NOT CONTROLLED WITH YOUR NAUSEA MEDICATION *UNUSUAL SHORTNESS OF BREATH *UNUSUAL BRUISING OR BLEEDING *URINARY PROBLEMS (pain or burning when urinating, or frequent urination) *BOWEL PROBLEMS (unusual diarrhea, constipation, pain near the anus) TENDERNESS IN MOUTH AND THROAT WITH OR WITHOUT PRESENCE OF ULCERS (sore throat, sores in mouth, or a toothache) UNUSUAL RASH, SWELLING OR PAIN  UNUSUAL VAGINAL DISCHARGE OR ITCHING   Items with * indicate a potential emergency and should be followed up as soon as possible or go to the Emergency Department if any problems should occur.  Please show the CHEMOTHERAPY ALERT CARD or IMMUNOTHERAPY ALERT CARD at check-in  to the Emergency Department and triage nurse.  Should you have questions after your visit or need to cancel or reschedule your appointment, please contact [facility]  Dept: [phone]  and follow the prompts.  Office hours are 8:00 a.m. to 4:30 p.m. Monday - Friday. Please note that voicemails left after 4:00 p.m. may not be returned until the following business day.  We are closed weekends and major holidays. You have access to a nurse at all times for urgent questions. Please call the main number to the clinic Dept: [phone] and follow the prompts.   For any non-urgent questions, you may also contact your provider using MyChart. We now offer e-Visits for anyone 18 and older to request care online for non-urgent symptoms. For details visit [email or web address].   Also download the MyChart app! Go to the app store, search "MyChart", open the app, select [facility], and log in with your MyChart username and password.  Due to Covid, a mask is required upon entering the hospital/clinic. If you do not have a mask, one will be given to you upon arrival. For doctor visits, patients may have 1 support person aged 18 or older with them. For treatment visits, patients cannot have anyone with them due to current Covid guidelines and our immunocompromised population.   

## 2021-08-30 NOTE — Assessment & Plan Note (Addendum)
She denies significant worsening peripheral neuropathy ?We discussed the use of cryo treatment ?I refilled her prescription gabapentin ?

## 2021-08-30 NOTE — Assessment & Plan Note (Signed)
She continues to have intermittent nausea without vomiting ?She will continue antiemetics ?

## 2021-08-30 NOTE — Assessment & Plan Note (Signed)
She has intermittent nausea and persistent neuropathy but otherwise tolerated treatment well ?We will proceed with treatment as scheduled this month ?Next month, I plan to repeat CT imaging for objective assessment of response to therapy ?

## 2021-08-31 ENCOUNTER — Telehealth: Payer: Self-pay | Admitting: Hematology and Oncology

## 2021-08-31 ENCOUNTER — Encounter: Payer: Self-pay | Admitting: Internal Medicine

## 2021-08-31 ENCOUNTER — Other Ambulatory Visit (HOSPITAL_COMMUNITY): Payer: Self-pay

## 2021-08-31 ENCOUNTER — Ambulatory Visit (INDEPENDENT_AMBULATORY_CARE_PROVIDER_SITE_OTHER): Payer: 59 | Admitting: Internal Medicine

## 2021-08-31 VITALS — BP 131/73 | HR 54 | Ht 66.0 in | Wt 198.0 lb

## 2021-08-31 DIAGNOSIS — C569 Malignant neoplasm of unspecified ovary: Secondary | ICD-10-CM

## 2021-08-31 DIAGNOSIS — E785 Hyperlipidemia, unspecified: Secondary | ICD-10-CM

## 2021-08-31 DIAGNOSIS — R072 Precordial pain: Secondary | ICD-10-CM | POA: Diagnosis not present

## 2021-08-31 DIAGNOSIS — I208 Other forms of angina pectoris: Secondary | ICD-10-CM | POA: Diagnosis not present

## 2021-08-31 DIAGNOSIS — N183 Chronic kidney disease, stage 3 unspecified: Secondary | ICD-10-CM | POA: Diagnosis not present

## 2021-08-31 DIAGNOSIS — I700 Atherosclerosis of aorta: Secondary | ICD-10-CM

## 2021-08-31 MED ORDER — NITROGLYCERIN 0.4 MG SL SUBL
0.4000 mg | SUBLINGUAL_TABLET | SUBLINGUAL | 3 refills | Status: DC | PRN
  Filled 2021-08-31: qty 25, 1d supply, fill #0

## 2021-08-31 MED ORDER — ISOSORBIDE MONONITRATE ER 30 MG PO TB24
30.0000 mg | ORAL_TABLET | Freq: Every day | ORAL | 3 refills | Status: DC
Start: 2021-08-31 — End: 2021-10-18
  Filled 2021-08-31: qty 90, 90d supply, fill #0

## 2021-08-31 MED ORDER — ATORVASTATIN CALCIUM 40 MG PO TABS
40.0000 mg | ORAL_TABLET | Freq: Every day | ORAL | 3 refills | Status: DC
  Filled 2021-08-31: qty 90, 90d supply, fill #0
  Filled 2021-12-03: qty 90, 90d supply, fill #1
  Filled 2022-02-10: qty 90, 90d supply, fill #2
  Filled 2022-07-14: qty 90, 90d supply, fill #3

## 2021-08-31 NOTE — Progress Notes (Signed)
Cardiology Office Note:    Date:  08/31/2021   ID:  [name], DOB [birth year], MRN [id number]  PCP:  [name], MD   [facility] Cardiologist:  Lenna Sciara, MD Referring MD: Heath Lark, MD   Chief Complaint/Reason for Referral: Chest pain ASSESSMENT:    1. Stable angina pectoris ([place])   2. Malignant granulosa cell tumor of ovary, unspecified laterality ([facility])   3. Stage 3 chronic kidney disease, unspecified whether stage 3a or 3b CKD ([place])   4. Aortic atherosclerosis ([place])   5. Hyperlipidemia, unspecified hyperlipidemia type     PLAN:    In order of problems listed above: 1.  Stable angina: We will start Imdur 30 mg at bedtime, PRN NTG, obtain a coronary CTA, and echocardiogram.  She is bradycardic so beta-blocker therapy will not be pursued.  I will have the patient follow-up with [name] in 4 months. 2.  Malignant ovarian cancer: Continue current therapy, this is being followed by other providers. 3.  Stage III chronic kidney disease: Monitor for now 4.  Aortic atherosclerosis: Continue aspirin and start atorvastatin 40 mg daily check lipid panel, LP(a), LFTs in 2 months. 5.  Hyperlipidemia: By virtue of aortic atherosclerosis patient's goal LDL is less than 70.  Her last LDL in March was 182.      Dispo:  Return in about 4 months (around 01/01/2022).      Medication Adjustments/Labs and Tests Ordered: Current medicines are reviewed at length with the patient today.  Concerns regarding medicines are outlined above.  The following changes have been made:     Labs/tests ordered: No orders of the defined types were placed in this encounter.   Medication Changes: No orders of the defined types were placed in this encounter.    Current medicines are reviewed at length with the patient today.  The patient does not have concerns regarding medicines.   History of Present Illness:    FOCUSED PROBLEM LIST:   1.  Metastatic ovarian cancer on Taxol  chemotherapy 2.  Aortic atherosclerosis on CT scan 2023 3.  Stage III chronic kidney disease 4.  Hyperlipidemia  The patient is a 66 y.o. female with the indicated medical history here for recommendations regarding chest pain.  Patient was seen by her oncologist recently.  She has a history of ovarian cancer.  Was initially diagnosed in 1994 and she had surgical resection followed by chemotherapy.  Unfortunately serial imaging has demonstrated progression of disease mostly with metastases limited to the abdomen.  Most recent imaging in February 2023 demonstrated stable peritoneal metastatic nodules with evidence of treatment response with no metastatic disease in the chest.  She was noted to have aortic atherosclerosis.  The patient tells me that she noticed about 3 to 4 weeks ago that on walking up an incline she develops chest pressure.  Also when she exercises she develops this pressure.  The pressure reliably goes away with rest.  She has not had any symptoms at rest.  She denies any exertional dyspnea, presyncope, syncope, palpitations, paroxysmal nocturnal dyspnea, orthopnea.  She has required no emergency room visits or hospitalizations.  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  She does not smoke.    Current Medications: Current Meds  Medication Sig   acetaminophen (TYLENOL) 500 MG tablet Take 1,000 mg by mouth every 6 (six) hours as needed for moderate pain.   aspirin EC 81 MG tablet Take 81 mg by mouth daily. Swallow whole.  Biotin 5 MG TABS Take 5 mg by mouth daily.   buPROPion (WELLBUTRIN SR) 200 MG 12 hr tablet Take 1 tablet (200 mg total) by mouth 2 (two) times daily.   Ca Phosphate-Cholecalciferol (EQL CALCIUM GUMMIES) 250-400 MG-UNIT CHEW Chew 1 tablet by mouth 2 (two) times daily.   escitalopram (LEXAPRO) 20 MG tablet Take 1 tablet (20 mg total) by mouth daily.   gabapentin (NEURONTIN) 300 MG capsule Take 1 capsule (300 mg total) by mouth 2 (two) times daily.    HYDROmorphone (DILAUDID) 2 MG tablet Take 1 tablet by mouth every 4 (four) hours as needed for severe pain.   LORazepam (ATIVAN) 0.5 MG tablet Take 1 tablet (0.5 mg total) by mouth 2 (two) times daily as needed for anxiety.   metFORMIN (GLUCOPHAGE) 500 MG tablet Take 1 tablet by mouth every morning and 2 tablets every evening as directed (need office visit for refills)   mirabegron ER (MYRBETRIQ) 50 MG TB24 tablet Take 1 tablet by mouth once daily   mupirocin ointment (BACTROBAN) 2 % Apply to the inside of each nostril twice daily for five (5) days. After application, press sides of nose together and gently massage. (Patient taking differently: Place 1 application. into the nose 2 (two) times daily as needed (nose sores).)   omeprazole (PRILOSEC) 20 MG capsule Take 1 capsule by mouth once a day   ondansetron (ZOFRAN) 8 MG tablet Take 1 tablet (8 mg total) by mouth every 8 (eight) hours as needed for nausea or vomiting.   PACLitaxel (TAXOL IV) Inject into the vein.   Polyethyl Glycol-Propyl Glycol (SYSTANE OP) Place 1 drop into both eyes daily as needed (dry eyes).   Prenatal MV & Min w/FA-DHA (PRENATAL GUMMIES) 0.18-25 MG CHEW Chew 1 capsule by mouth daily.   Probiotic Product (ALIGN PO) Take 1 tablet by mouth daily.    prochlorperazine (COMPAZINE) 10 MG tablet Take 1 tablet by mouth every 6 hours as needed for nausea or vomiting.   Vitamin D, Ergocalciferol, (DRISDOL) 1.25 MG (50000 UNIT) CAPS capsule Take 1 capsule (50,000 Units total) by mouth every 7 (seven) days. (Need Office Visit)     Allergies:    Codeine, Thimerosal, Ciprofloxacin, Daypro [oxaprozin], Levofloxacin, Stadol [butorphanol tartrate], and Sulfa antibiotics   Social History:   Social History   Tobacco Use   Smoking status: Never   Smokeless tobacco: Never  Vaping Use   Vaping Use: Never used  Substance Use Topics   Alcohol use: Not Currently   Drug use: No     Family Hx: Family History  Problem Relation Age of  Onset   Basal cell carcinoma Mother        36s   Thyroid disease Mother    Stroke Father    Colon cancer Father 75   Hypertension Father    Heart disease Father    Colon cancer Paternal Uncle        dx 24s   Colon cancer Cousin        maternal cousin; dx 67s     Review of Systems:   Please see the history of present illness.    All other systems reviewed and are negative.     EKGs/Labs/Other Test Reviewed:    EKG:   EKG performed today that I personally reviewed demonstrates sinus bradycardia.  Prior CV studies: None relevant  Other studies Reviewed: Review of the additional studies/records demonstrates: Chest CT 2023 demonstrates aortic atherosclerosis and no coronary artery calcification  Recent Labs: 07/09/2021:  TSH 0.679 08/29/2021: ALT 17; BUN 19; Creatinine, Ser 1.06; Hemoglobin 13.0; Platelets 307; Potassium 4.4; Sodium 136   Recent Lipid Panel Lab Results  Component Value Date/Time   CHOL 255 (H) 07/09/2021 08:13 AM   TRIG 126 07/09/2021 08:13 AM   HDL 50 07/09/2021 08:13 AM   LDLCALC 182 (H) 07/09/2021 08:13 AM    Risk Assessment/Calculations:           Physical Exam:    VS:  BP 131/73   Pulse (!) 54   Ht '5\' 6"'$  (1.676 m)   Wt 198 lb (89.8 kg)   BMI 31.96 kg/m    Wt Readings from Last 3 Encounters:  08/31/21 198 lb (89.8 kg)  08/30/21 198 lb 11.2 oz (90.1 kg)  08/28/21 193 lb (87.5 kg)    GENERAL:  No apparent distress, AOx3 HEENT:  No carotid bruits, +2 carotid impulses, no scleral icterus CAR: RRR no murmurs, gallops, rubs, or thrills RES:  Clear to auscultation bilaterally ABD:  Soft, nontender, nondistended, positive bowel sounds x 4 VASC:  +2 radial pulses, +2 carotid pulses, palpable pedal pulses NEURO:  CN 2-12 grossly intact; motor and sensory grossly intact PSYCH:  No active depression or anxiety EXT:  No edema, ecchymosis, or cyanosis  Signed, Early Osmond, MD  08/31/2021 5:09 PM    [address], [place], [place]  [address] Phone: [phone]; Fax: [phone]   Note:  This document was prepared using Dragon voice recognition software and may include unintentional dictation errors.

## 2021-08-31 NOTE — Patient Instructions (Addendum)
Medication Instructions:  ?Your physician has recommended you make the following change in your medication:  ?1.) start atorvastatin 40 mg - one tablet daily ?2.) start isosorbide (IMDUR) 30 mg - one tablet daily at bedtime ?3.) nitroglycerin 0.4 mg - as directed ?  If a single episode of chest pain is not relieved by one tablet, try another within 5 minutes; and if this doesn't relieve the pain, try another within 5 minutes and if this doesn't relieve the pain call 911 for transportation to an emergency department.  ? ? ?*If you need a refill on your cardiac medications before your next appointment, please call your pharmacy* ? ? ?Lab Work: ?Please return in 2 months for labs (lipids/liver/Lp(a) ? ?If you have labs (blood work) drawn today and your tests are completely normal, you will receive your results only by: ?MyChart Message (if you have MyChart) OR ?A paper copy in the mail ?If you have any lab test that is abnormal or we need to change your treatment, we will call you to review the results. ? ? ?Testing/Procedures: ?Your physician has requested that you have an echocardiogram. Echocardiography is a painless test that uses sound waves to create images of your heart. It provides your doctor with information about the size and shape of your heart and how well your heart?s chambers and valves are working. This procedure takes approximately one hour. There are no restrictions for this procedure. ? ?Cardiac CTA - see instructions below. ? ? ?Follow-Up: ?At [facility], you and your health needs are our priority.  As part of our continuing mission to provide you with exceptional heart care, we have created designated Provider Care Teams.  These Care Teams include your primary Cardiologist (physician) and Advanced Practice Providers (APPs -  Physician Assistants and Nurse Practitioners) who all work together to provide you with the care you need, when you need it. ? ? ?Your next appointment:   ?4  month(s) ? ?The format for your next appointment:   ?In Person ? ?Provider:   ?Early Osmond, MD   ? ? ?Other Instructions ? ? ?Your cardiac CT will be scheduled at  ?[facility] ?[address] ?[place], [place] [address] ?[phone] ? ?Please arrive at the [facility] Entrance (Entrance C2) of [facility] 30 minutes prior to test start time. ?You can use the FREE valet parking offered at entrance C (encouraged to control the heart rate for the test)  ?Proceed to the [facility] Radiology Department (first floor) to check-in and test prep. ? ?All radiology patients and guests should use entrance C2 at [facility], accessed from [facility], even though the hospital's physical address listed is [address] ? ? ? ?Please follow these instructions carefully (unless otherwise directed): ? ? ?On the Night Before the Test: ?Be sure to Drink plenty of water. ?Do not consume any caffeinated/decaffeinated beverages or chocolate 12 hours prior to your test. ?Do not take any antihistamines 12 hours prior to your test. ? ?On the Day of the Test: ?Drink plenty of water until 1 hour prior to the test. ?Do not eat any food 4 hours prior to the test. ?You may take your regular medications prior to the test.  ?FEMALES- please wear underwire-free bra if available, avoid dresses & tight clothing ? ?After the Test: ?Drink plenty of water. ?After receiving IV contrast, you may experience a mild flushed feeling. This is normal. ?On occasion, you may experience  a mild rash up to 24 hours after the test. This is not dangerous. If this occurs, you can take Benadryl 25 mg and increase your fluid intake. ?If you experience trouble breathing, this can be serious. If it is severe call 911 IMMEDIATELY. If it is mild, please call our office. ?If you take any of these medications: Glipizide/Metformin, Avandament, Glucavance, please do not take 48 hours after completing test  unless otherwise instructed. ? ?We will call to schedule your test 2-4 weeks out understanding that some insurance companies will need an authorization prior to the service being performed.  ? ?For non-scheduling related questions, please contact the cardiac imaging nurse navigator should you have any questions/concerns: ?Marchia Bond, Cardiac Imaging Nurse Navigator ?Gordy Clement, Cardiac Imaging Nurse Navigator ?[facility] ?Direct Office Dial: [phone]  ? ?For scheduling needs, including cancellations and rescheduling, please call [place], [phone]. ? ? ?Important Information About Sugar ? ? ? ? ?  ?Mediterranean Diet ?A Mediterranean diet refers to food and lifestyle choices that are based on the traditions of countries located on the [employer]. It focuses on eating more fruits, vegetables, whole grains, beans, nuts, seeds, and heart-healthy fats, and eating less dairy, meat, eggs, and processed foods with added sugar, salt, and fat. This way of eating has been shown to help prevent certain conditions and improve outcomes for people who have chronic diseases, like kidney disease and heart disease. ?What are tips for following this plan? ?Reading food labels ?Check the serving size of packaged foods. For foods such as rice and pasta, the serving size refers to the amount of cooked product, not dry. ?Check the total fat in packaged foods. Avoid foods that have saturated fat or trans fats. ?Check the ingredient list for added sugars, such as corn syrup. ?Shopping ? ?Buy a variety of foods that offer a balanced diet, including: ?Fresh fruits and vegetables (produce). ?Grains, beans, nuts, and seeds. Some of these may be available in unpackaged forms or large amounts (in bulk). ?Fresh seafood. ?Poultry and eggs. ?Low-fat dairy products. ?Buy whole ingredients instead of prepackaged foods. ?Buy fresh fruits and vegetables in-season from local farmers markets. ?Buy plain  frozen fruits and vegetables. ?If you do not have access to quality fresh seafood, buy precooked frozen shrimp or canned fish, such as tuna, salmon, or sardines. ?Stock your pantry so you always have certain foods on hand, such as olive oil, canned tuna, canned tomatoes, rice, pasta, and beans. ?Cooking ?Cook foods with extra-virgin olive oil instead of using butter or other vegetable oils. ?Have meat as a side dish, and have vegetables or grains as your main dish. This means having meat in small portions or adding small amounts of meat to foods like pasta or stew. ?Use beans or vegetables instead of meat in common dishes like chili or lasagna. ?Experiment with different cooking methods. Try roasting, broiling, steaming, and saut?ing vegetables. ?Add frozen vegetables to soups, stews, pasta, or rice. ?Add nuts or seeds for added healthy fats and plant protein at each meal. You can add these to yogurt, salads, or vegetable dishes. ?Marinate fish or vegetables using olive oil, lemon juice, garlic, and fresh herbs. ?Meal planning ?Plan to eat one vegetarian meal one day each week. Try to work up to two vegetarian meals, if possible. ?Eat seafood two or more times a week. ?Have healthy snacks readily available, such as: ?Vegetable sticks with hummus. ?[place] yogurt. ?Fruit and nut trail mix. ?Eat balanced meals throughout the week.  This includes: ?Fruit: 2-3 servings a day. ?Vegetables: 4-5 servings a day. ?Low-fat dairy: 2 servings a day. ?Fish, poultry, or lean meat: 1 serving a day. ?Beans and legumes: 2 or more servings a week. ?Nuts and seeds: 1-2 servings a day. ?Whole grains: 6-8 servings a day. ?Extra-virgin olive oil: 3-4 servings a day. ?Limit red meat and sweets to only a few servings a month. ?Lifestyle ? ?Cook and eat meals together with your family, when possible. ?Drink enough fluid to keep your urine pale yellow. ?Be physically active every day. This includes: ?Aerobic exercise like running or  swimming. ?Leisure activities like gardening, walking, or housework. ?Get 7-8 hours of sleep each night. ?If recommended by your health care provider, drink red wine in moderation. This means 1 glass a day for nonpregnant women and

## 2021-08-31 NOTE — Telephone Encounter (Signed)
Scheduled appointment per 5/11 los. Left message. ?

## 2021-09-08 NOTE — Progress Notes (Unsigned)
Chief Complaint:   OBESITY [name] is here to discuss her progress with her obesity treatment plan along with follow-up of her obesity related diagnoses. [name] is on keeping a food journal and adhering to recommended goals of 1200 calories and 80 grams of protein and states she is following her eating plan approximately 10% of the time. [name] states she is going to the gym for 60 minutes twice a week and waling 45 minutes 1 time per week.  Today's visit was #: 79 Starting weight: 209 lbs Starting date: 04/29/2018 Today's weight: 193 lbs Today's date: 08/28/2021 Total lbs lost to date: 16 lbs Total lbs lost since last in-office visit: 0 lbs  Interim History: [name] has been feeling nauseated lately. Her mom passed March 7th and her memorial service was this past Saturday. [name] last saw Dr. Leafy Ro on July 09, 2021 and labs that were done at that visit were reviewed today.  Subjective:   1. Other hyperlipidemia [name] is not currently taking and medication. Labs discussed with [name] and her LDL is worse. She is at risk for ASCVD. She does not have a family history of early CAD or CVA.  2. Vitamin D deficiency Labs discussed with [name] and her Vitamin D level is too low.  3. Prediabetes Labs discussed with [name] and her A!C level is stable.  4. Other depression, with emotional eating [name] is on medications, and she is doing well with decreasing emotional eating behaviors. She has a lot of stress recently. Discussed with [name] that her thyroid level is with in normal limits.  5. At risk for heart disease Nitisha is at a higher risk for heart disease due to her diabetes and hyperlipidemia.   Assessment/Plan:  No orders of the defined types were placed in this encounter.   Medications Discontinued During This Encounter  Medication Reason   cyclobenzaprine (FLEXERIL) 5 MG tablet Patient Preference   escitalopram (LEXAPRO) 20 MG tablet Duplicate   buPROPion (WELLBUTRIN SR) 200 MG 12  hr tablet Reorder   metFORMIN (GLUCOPHAGE) 500 MG tablet Reorder     Meds ordered this encounter  Medications   buPROPion (WELLBUTRIN SR) 200 MG 12 hr tablet    Sig: Take 1 tablet (200 mg total) by mouth 2 (two) times daily.    Dispense:  180 tablet    Refill:  0    90 d supply; ov for RF   Vitamin D, Ergocalciferol, (DRISDOL) 1.25 MG (50000 UNIT) CAPS capsule    Sig: Take 1 capsule (50,000 Units total) by mouth every 7 (seven) days. (Need Office Visit)    Dispense:  12 capsule    Refill:  0    No rf - needs OV   metFORMIN (GLUCOPHAGE) 500 MG tablet    Sig: Take 1 tablet by mouth every morning and 2 tablets every evening as directed (need office visit for refills)    Dispense:  270 tablet    Refill:  0    90 d supply; ov for RF     1. Other hyperlipidemia [name] will follow up with her PCP, Dr. Arbutus Ped to discuss statin management. She will decrease trans fats; focus on lean meat and egg whites, and increase her fiber. [name] will also increase her exercise.  2. Vitamin D deficiency [name]'s Vitamin D level is suboptimally controlled. She will restart Vitamin D.  - Vitamin D, Ergocalciferol, (DRISDOL) 1.25 MG (50000 UNIT) CAPS capsule; Take 1 capsule (50,000 Units total) by mouth every 7 (seven)  days. (Need Office Visit)  Dispense: 12 capsule; Refill: 0  3. Prediabetes [name] will continue to work on weight loss, exercise, and decreasing simple carbohydrates to help decrease the risk of diabetes.  We will refill her Metformin.  - metFORMIN (GLUCOPHAGE) 500 MG tablet; Take 1 tablet by mouth every morning and 2 tablets every evening as directed (need office visit for refills)  Dispense: 270 tablet; Refill: 0  4. Other depression, with emotional eating We will refill Wellbutrin and she will discontinue taking Lexapro. - buPROPion (WELLBUTRIN SR) 200 MG 12 hr tablet; Take 1 tablet (200 mg total) by mouth 2 (two) times daily.  Dispense: 180 tablet; Refill: 0  5. At risk for heart  disease [name] was given approximately 15 minutes of coronary artery disease prevention counseling today. She is 66 y.o. female and has risk factors for heart disease including obesity. We discussed intensive lifestyle modifications today with an emphasis on specific weight loss instructions and strategies.  Repetitive spaced learning was employed today to elicit superior memory formation and behavioral change.     6. Obesity with current BMI of 31.2 [name] is currently in the action stage of change. As such, her goal is to continue with weight loss efforts. She has agreed to keeping a food journal and adhering to recommended goals of 1200 calories and 80 grams of protein.   Exercise goals: [name] will work on getting 150 minutes of exercise weekly.  Behavioral modification strategies: increasing water intake, increasing high fiber foods, keeping healthy foods in the home, and planning for success.  [name] has agreed to follow-up with our clinic in 12 weeks. She was informed of the importance of frequent follow-up visits to maximize her success with intensive lifestyle modifications for her multiple health conditions.   Objective:   Blood pressure 119/74, pulse (!) 57, temperature 97.8 F (36.6 C), height '5\' 6"'$  (1.676 m), weight 193 lb (87.5 kg), SpO2 98 %. Body mass index is 31.15 kg/m.  General: Cooperative, alert, well developed, in no acute distress. HEENT: Conjunctivae and lids unremarkable. Cardiovascular: Regular rhythm.  Lungs: Normal work of breathing. Neurologic: No focal deficits.   Lab Results  Component Value Date   CREATININE 1.06 (H) 08/29/2021   BUN 19 08/29/2021   NA 136 08/29/2021   K 4.4 08/29/2021   CL 99 08/29/2021   CO2 31 08/29/2021   Lab Results  Component Value Date   ALT 17 08/29/2021   AST 16 08/29/2021   ALKPHOS 75 08/29/2021   BILITOT 0.4 08/29/2021   Lab Results  Component Value Date   HGBA1C 5.4 07/09/2021   HGBA1C 5.2 11/30/2019   HGBA1C 5.3  05/31/2019   HGBA1C 5.2 11/19/2018   HGBA1C 5.5 04/29/2018   Lab Results  Component Value Date   INSULIN 17.1 07/09/2021   INSULIN 23.8 11/30/2019   INSULIN 16.5 05/31/2019   INSULIN 17.6 11/19/2018   INSULIN 25.8 (H) 04/29/2018   Lab Results  Component Value Date   TSH 0.679 07/09/2021   Lab Results  Component Value Date   CHOL 255 (H) 07/09/2021   HDL 50 07/09/2021   LDLCALC 182 (H) 07/09/2021   TRIG 126 07/09/2021   Lab Results  Component Value Date   VD25OH 38.6 07/09/2021   VD25OH 59.0 11/30/2019   VD25OH 54.0 05/31/2019   Lab Results  Component Value Date   WBC 7.2 08/29/2021   HGB 13.0 08/29/2021   HCT 39.6 08/29/2021   MCV 93.8 08/29/2021   PLT 307 08/29/2021  No results found for: IRON, TIBC, FERRITIN  AGREE: Multiple dietary modification options and treatment options were discussed and [name] agreed to follow the recommendations documented in the above note.  ARRANGE: [name] was educated on the importance of frequent visits to treat obesity as outlined per CMS and USPSTF guidelines and agreed to schedule her next follow up appointment today.  Attestation Statements:   Reviewed by clinician on day of visit: allergies, medications, problem list, medical history, surgical history, family history, social history, and previous encounter notes.  ILennette Mosley, CMA, am acting as transcriptionist for Dr. Raliegh Scarlet, DO.  I have reviewed the above documentation for accuracy and completeness, and I agree with the above. Marjory Sneddon, D.O.  The [place] was signed into law in 2016 which includes the topic of electronic health records.  This provides immediate access to information in MyChart.  This includes consultation notes, operative notes, office notes, lab results and pathology reports.  If you have any questions about what you read please let us know at your next visit so we can discuss your concerns and take corrective action if need be.   We are right here with you.

## 2021-09-10 ENCOUNTER — Other Ambulatory Visit (HOSPITAL_COMMUNITY): Payer: Self-pay

## 2021-09-10 DIAGNOSIS — G4733 Obstructive sleep apnea (adult) (pediatric): Secondary | ICD-10-CM | POA: Diagnosis not present

## 2021-09-12 ENCOUNTER — Inpatient Hospital Stay: Payer: 59

## 2021-09-12 DIAGNOSIS — C786 Secondary malignant neoplasm of retroperitoneum and peritoneum: Secondary | ICD-10-CM | POA: Diagnosis not present

## 2021-09-12 DIAGNOSIS — Z5111 Encounter for antineoplastic chemotherapy: Secondary | ICD-10-CM | POA: Diagnosis not present

## 2021-09-12 DIAGNOSIS — G620 Drug-induced polyneuropathy: Secondary | ICD-10-CM | POA: Diagnosis not present

## 2021-09-12 DIAGNOSIS — I208 Other forms of angina pectoris: Secondary | ICD-10-CM | POA: Diagnosis not present

## 2021-09-12 DIAGNOSIS — R110 Nausea: Secondary | ICD-10-CM | POA: Diagnosis not present

## 2021-09-12 DIAGNOSIS — C569 Malignant neoplasm of unspecified ovary: Secondary | ICD-10-CM

## 2021-09-12 LAB — CBC WITH DIFFERENTIAL/PLATELET
Abs Immature Granulocytes: 0.01 10*3/uL (ref 0.00–0.07)
Basophils Absolute: 0 10*3/uL (ref 0.0–0.1)
Basophils Relative: 1 %
Eosinophils Absolute: 0.2 10*3/uL (ref 0.0–0.5)
Eosinophils Relative: 4 %
HCT: 37.1 % (ref 36.0–46.0)
Hemoglobin: 12.5 g/dL (ref 12.0–15.0)
Immature Granulocytes: 0 %
Lymphocytes Relative: 35 %
Lymphs Abs: 1.9 10*3/uL (ref 0.7–4.0)
MCH: 30.9 pg (ref 26.0–34.0)
MCHC: 33.7 g/dL (ref 30.0–36.0)
MCV: 91.8 fL (ref 80.0–100.0)
Monocytes Absolute: 0.5 10*3/uL (ref 0.1–1.0)
Monocytes Relative: 9 %
Neutro Abs: 2.8 10*3/uL (ref 1.7–7.7)
Neutrophils Relative %: 51 %
Platelets: 302 10*3/uL (ref 150–400)
RBC: 4.04 MIL/uL (ref 3.87–5.11)
RDW: 13.2 % (ref 11.5–15.5)
WBC: 5.5 10*3/uL (ref 4.0–10.5)
nRBC: 0 % (ref 0.0–0.2)

## 2021-09-12 LAB — COMPREHENSIVE METABOLIC PANEL
ALT: 17 U/L (ref 0–44)
AST: 18 U/L (ref 15–41)
Albumin: 3.9 g/dL (ref 3.5–5.0)
Alkaline Phosphatase: 68 U/L (ref 38–126)
Anion gap: 7 (ref 5–15)
BUN: 27 mg/dL — ABNORMAL HIGH (ref 8–23)
CO2: 26 mmol/L (ref 22–32)
Calcium: 9.1 mg/dL (ref 8.9–10.3)
Chloride: 105 mmol/L (ref 98–111)
Creatinine, Ser: 1.06 mg/dL — ABNORMAL HIGH (ref 0.44–1.00)
GFR, Estimated: 59 mL/min — ABNORMAL LOW (ref >60)
Glucose, Bld: 96 mg/dL (ref 70–99)
Potassium: 3.9 mmol/L (ref 3.5–5.1)
Sodium: 138 mmol/L (ref 135–145)
Total Bilirubin: 0.5 mg/dL (ref 0.3–1.2)
Total Protein: 6.9 g/dL (ref 6.5–8.1)

## 2021-09-13 ENCOUNTER — Inpatient Hospital Stay: Payer: 59

## 2021-09-14 ENCOUNTER — Inpatient Hospital Stay: Payer: 59

## 2021-09-14 VITALS — BP 106/53 | HR 64 | Temp 98.2°F | Resp 18 | Wt 192.5 lb

## 2021-09-14 DIAGNOSIS — G620 Drug-induced polyneuropathy: Secondary | ICD-10-CM | POA: Diagnosis not present

## 2021-09-14 DIAGNOSIS — Z7189 Other specified counseling: Secondary | ICD-10-CM

## 2021-09-14 DIAGNOSIS — I208 Other forms of angina pectoris: Secondary | ICD-10-CM | POA: Diagnosis not present

## 2021-09-14 DIAGNOSIS — C569 Malignant neoplasm of unspecified ovary: Secondary | ICD-10-CM

## 2021-09-14 DIAGNOSIS — Z5111 Encounter for antineoplastic chemotherapy: Secondary | ICD-10-CM | POA: Diagnosis not present

## 2021-09-14 DIAGNOSIS — C786 Secondary malignant neoplasm of retroperitoneum and peritoneum: Secondary | ICD-10-CM | POA: Diagnosis not present

## 2021-09-14 DIAGNOSIS — R110 Nausea: Secondary | ICD-10-CM | POA: Diagnosis not present

## 2021-09-14 MED ORDER — FAMOTIDINE IN NACL 20-0.9 MG/50ML-% IV SOLN
20.0000 mg | Freq: Once | INTRAVENOUS | Status: AC
  Administered 2021-09-14: 20 mg via INTRAVENOUS
  Filled 2021-09-14: qty 50

## 2021-09-14 MED ORDER — DEXAMETHASONE SODIUM PHOSPHATE 10 MG/ML IJ SOLN
5.0000 mg | Freq: Once | INTRAMUSCULAR | Status: AC
  Administered 2021-09-14: 5 mg via INTRAVENOUS
  Filled 2021-09-14: qty 1

## 2021-09-14 MED ORDER — DIPHENHYDRAMINE HCL 50 MG/ML IJ SOLN
25.0000 mg | Freq: Once | INTRAMUSCULAR | Status: AC
  Administered 2021-09-14: 25 mg via INTRAVENOUS
  Filled 2021-09-14: qty 1

## 2021-09-14 MED ORDER — SODIUM CHLORIDE 0.9 % IV SOLN: Freq: Once | INTRAVENOUS | Status: AC

## 2021-09-14 MED ORDER — SODIUM CHLORIDE 0.9 % IV SOLN
60.0000 mg/m2 | Freq: Once | INTRAVENOUS | Status: AC
  Administered 2021-09-14: 126 mg via INTRAVENOUS
  Filled 2021-09-14: qty 21

## 2021-09-14 NOTE — Patient Instructions (Signed)
[facility]   Discharge Instructions: Thank you for choosing [facility] to provide your oncology and hematology care.   If you have a lab appointment with the Cancer Center, please go directly to the Cancer Center and check in at the registration area.   Wear comfortable clothing and clothing appropriate for easy access to any Portacath or PICC line.   We strive to give you quality time with your provider. You may need to reschedule your appointment if you arrive late (15 or more minutes).  Arriving late affects you and other patients whose appointments are after yours.  Also, if you miss three or more appointments without notifying the office, you may be dismissed from the clinic at the provider's discretion.      For prescription refill requests, have your pharmacy contact our office and allow 72 hours for refills to be completed.    Today you received the following chemotherapy and/or immunotherapy agents: paclitaxel.      To help prevent nausea and vomiting after your treatment, we encourage you to take your nausea medication as directed.  BELOW ARE SYMPTOMS THAT SHOULD BE REPORTED IMMEDIATELY: *FEVER GREATER THAN 100.4 F (38 C) OR HIGHER *CHILLS OR SWEATING *NAUSEA AND VOMITING THAT IS NOT CONTROLLED WITH YOUR NAUSEA MEDICATION *UNUSUAL SHORTNESS OF BREATH *UNUSUAL BRUISING OR BLEEDING *URINARY PROBLEMS (pain or burning when urinating, or frequent urination) *BOWEL PROBLEMS (unusual diarrhea, constipation, pain near the anus) TENDERNESS IN MOUTH AND THROAT WITH OR WITHOUT PRESENCE OF ULCERS (sore throat, sores in mouth, or a toothache) UNUSUAL RASH, SWELLING OR PAIN  UNUSUAL VAGINAL DISCHARGE OR ITCHING   Items with * indicate a potential emergency and should be followed up as soon as possible or go to the Emergency Department if any problems should occur.  Please show the CHEMOTHERAPY ALERT CARD or IMMUNOTHERAPY ALERT CARD at check-in  to the Emergency Department and triage nurse.  Should you have questions after your visit or need to cancel or reschedule your appointment, please contact [facility]  Dept: [phone]  and follow the prompts.  Office hours are 8:00 a.m. to 4:30 p.m. Monday - Friday. Please note that voicemails left after 4:00 p.m. may not be returned until the following business day.  We are closed weekends and major holidays. You have access to a nurse at all times for urgent questions. Please call the main number to the clinic Dept: [phone] and follow the prompts.   For any non-urgent questions, you may also contact your provider using MyChart. We now offer e-Visits for anyone 18 and older to request care online for non-urgent symptoms. For details visit [email or web address].   Also download the MyChart app! Go to the app store, search "MyChart", open the app, select [facility], and log in with your MyChart username and password.  Due to Covid, a mask is required upon entering the hospital/clinic. If you do not have a mask, one will be given to you upon arrival. For doctor visits, patients may have 1 support [place] aged 18 or older with them. For treatment visits, patients cannot have anyone with them due to current Covid guidelines and our immunocompromised population.   

## 2021-09-20 ENCOUNTER — Telehealth (HOSPITAL_COMMUNITY): Payer: Self-pay | Admitting: *Deleted

## 2021-09-20 NOTE — Telephone Encounter (Signed)
Reaching out to patient to offer assistance regarding upcoming cardiac imaging study; pt verbalizes understanding of appt date/time, parking situation and where to check in, pre-test NPO status, and verified current allergies; name and call back number provided for further questions should they arise  Jacqueline Clement RN Navigator Cardiac [place] [facility] [phone] office [phone] cell  Patient aware to arrive at 2:45pm.

## 2021-09-21 ENCOUNTER — Ambulatory Visit (HOSPITAL_BASED_OUTPATIENT_CLINIC_OR_DEPARTMENT_OTHER): Payer: 59

## 2021-09-21 ENCOUNTER — Ambulatory Visit (HOSPITAL_COMMUNITY)
Admission: RE | Admit: 2021-09-21 | Discharge: 2021-09-21 | Disposition: A | Discharge status: Home / Self Care | Payer: 59 | Source: Ambulatory Visit | Attending: Internal Medicine | Admitting: Internal Medicine

## 2021-09-21 DIAGNOSIS — N183 Chronic kidney disease, stage 3 unspecified: Secondary | ICD-10-CM

## 2021-09-21 DIAGNOSIS — E785 Hyperlipidemia, unspecified: Secondary | ICD-10-CM | POA: Insufficient documentation

## 2021-09-21 DIAGNOSIS — I700 Atherosclerosis of aorta: Secondary | ICD-10-CM | POA: Diagnosis not present

## 2021-09-21 DIAGNOSIS — R072 Precordial pain: Secondary | ICD-10-CM

## 2021-09-21 DIAGNOSIS — C569 Malignant neoplasm of unspecified ovary: Secondary | ICD-10-CM | POA: Diagnosis not present

## 2021-09-21 DIAGNOSIS — I208 Other forms of angina pectoris: Secondary | ICD-10-CM

## 2021-09-21 LAB — ECHOCARDIOGRAM COMPLETE
Area-P 1/2: 3.99 cm2
S' Lateral: 2.8 cm

## 2021-09-21 MED ORDER — NITROGLYCERIN 0.4 MG SL SUBL
0.8000 mg | SUBLINGUAL_TABLET | Freq: Once | SUBLINGUAL | Status: AC
  Administered 2021-09-21: 0.8 mg via SUBLINGUAL

## 2021-09-21 MED ORDER — NITROGLYCERIN 0.4 MG SL SUBL
SUBLINGUAL_TABLET | SUBLINGUAL | Status: AC
  Filled 2021-09-21: qty 2

## 2021-09-21 MED ORDER — IOHEXOL 350 MG/ML SOLN
100.0000 mL | Freq: Once | INTRAVENOUS | Status: AC | PRN
  Administered 2021-09-21: 100 mL via INTRAVENOUS

## 2021-09-26 ENCOUNTER — Inpatient Hospital Stay: Payer: 59 | Attending: Hematology

## 2021-09-26 DIAGNOSIS — Z5111 Encounter for antineoplastic chemotherapy: Secondary | ICD-10-CM | POA: Insufficient documentation

## 2021-09-26 DIAGNOSIS — C569 Malignant neoplasm of unspecified ovary: Secondary | ICD-10-CM | POA: Insufficient documentation

## 2021-09-26 DIAGNOSIS — C786 Secondary malignant neoplasm of retroperitoneum and peritoneum: Secondary | ICD-10-CM | POA: Insufficient documentation

## 2021-09-26 DIAGNOSIS — N183 Chronic kidney disease, stage 3 unspecified: Secondary | ICD-10-CM | POA: Insufficient documentation

## 2021-09-27 ENCOUNTER — Inpatient Hospital Stay: Payer: 59

## 2021-09-27 ENCOUNTER — Other Ambulatory Visit: Payer: Self-pay

## 2021-09-27 VITALS — BP 118/52 | HR 60 | Temp 97.7°F | Resp 16 | Wt 194.3 lb

## 2021-09-27 DIAGNOSIS — C569 Malignant neoplasm of unspecified ovary: Secondary | ICD-10-CM

## 2021-09-27 DIAGNOSIS — Z5111 Encounter for antineoplastic chemotherapy: Secondary | ICD-10-CM | POA: Diagnosis not present

## 2021-09-27 DIAGNOSIS — C786 Secondary malignant neoplasm of retroperitoneum and peritoneum: Secondary | ICD-10-CM | POA: Diagnosis not present

## 2021-09-27 DIAGNOSIS — Z7189 Other specified counseling: Secondary | ICD-10-CM

## 2021-09-27 DIAGNOSIS — N183 Chronic kidney disease, stage 3 unspecified: Secondary | ICD-10-CM | POA: Diagnosis not present

## 2021-09-27 LAB — CBC WITH DIFFERENTIAL/PLATELET
Abs Immature Granulocytes: 0.02 10*3/uL (ref 0.00–0.07)
Basophils Absolute: 0.1 10*3/uL (ref 0.0–0.1)
Basophils Relative: 1 %
Eosinophils Absolute: 0.2 10*3/uL (ref 0.0–0.5)
Eosinophils Relative: 2 %
HCT: 38.2 % (ref 36.0–46.0)
Hemoglobin: 12.9 g/dL (ref 12.0–15.0)
Immature Granulocytes: 0 %
Lymphocytes Relative: 28 %
Lymphs Abs: 1.7 10*3/uL (ref 0.7–4.0)
MCH: 30.9 pg (ref 26.0–34.0)
MCHC: 33.8 g/dL (ref 30.0–36.0)
MCV: 91.4 fL (ref 80.0–100.0)
Monocytes Absolute: 0.4 10*3/uL (ref 0.1–1.0)
Monocytes Relative: 7 %
Neutro Abs: 3.8 10*3/uL (ref 1.7–7.7)
Neutrophils Relative %: 62 %
Platelets: 284 10*3/uL (ref 150–400)
RBC: 4.18 MIL/uL (ref 3.87–5.11)
RDW: 13.2 % (ref 11.5–15.5)
WBC: 6.2 10*3/uL (ref 4.0–10.5)
nRBC: 0 % (ref 0.0–0.2)

## 2021-09-27 LAB — COMPREHENSIVE METABOLIC PANEL
ALT: 19 U/L (ref 0–44)
AST: 18 U/L (ref 15–41)
Albumin: 4.1 g/dL (ref 3.5–5.0)
Alkaline Phosphatase: 80 U/L (ref 38–126)
Anion gap: 6 (ref 5–15)
BUN: 21 mg/dL (ref 8–23)
CO2: 28 mmol/L (ref 22–32)
Calcium: 9.7 mg/dL (ref 8.9–10.3)
Chloride: 102 mmol/L (ref 98–111)
Creatinine, Ser: 1.03 mg/dL — ABNORMAL HIGH (ref 0.44–1.00)
GFR, Estimated: >60 mL/min (ref >60)
Glucose, Bld: 91 mg/dL (ref 70–99)
Potassium: 4.6 mmol/L (ref 3.5–5.1)
Sodium: 136 mmol/L (ref 135–145)
Total Bilirubin: 0.5 mg/dL (ref 0.3–1.2)
Total Protein: 6.7 g/dL (ref 6.5–8.1)

## 2021-09-27 MED ORDER — SODIUM CHLORIDE 0.9 % IV SOLN: Freq: Once | INTRAVENOUS | Status: AC

## 2021-09-27 MED ORDER — SODIUM CHLORIDE 0.9 % IV SOLN
60.0000 mg/m2 | Freq: Once | INTRAVENOUS | Status: AC
  Administered 2021-09-27: 126 mg via INTRAVENOUS
  Filled 2021-09-27: qty 21

## 2021-09-27 MED ORDER — DIPHENHYDRAMINE HCL 50 MG/ML IJ SOLN
25.0000 mg | Freq: Once | INTRAMUSCULAR | Status: AC
  Administered 2021-09-27: 25 mg via INTRAVENOUS
  Filled 2021-09-27: qty 1

## 2021-09-27 MED ORDER — DEXAMETHASONE SODIUM PHOSPHATE 10 MG/ML IJ SOLN
5.0000 mg | Freq: Once | INTRAMUSCULAR | Status: AC
  Administered 2021-09-27: 5 mg via INTRAVENOUS
  Filled 2021-09-27: qty 1

## 2021-09-27 MED ORDER — FAMOTIDINE IN NACL 20-0.9 MG/50ML-% IV SOLN
20.0000 mg | Freq: Once | INTRAVENOUS | Status: AC
  Administered 2021-09-27: 20 mg via INTRAVENOUS
  Filled 2021-09-27: qty 50

## 2021-09-27 NOTE — Patient Instructions (Signed)
[facility]  Discharge Instructions: Thank you for choosing [facility] to provide your oncology and hematology care.   If you have a lab appointment with the Cancer Center, please go directly to the Cancer Center and check in at the registration area.   Wear comfortable clothing and clothing appropriate for easy access to any Portacath or PICC line.   We strive to give you quality time with your provider. You may need to reschedule your appointment if you arrive late (15 or more minutes).  Arriving late affects you and other patients whose appointments are after yours.  Also, if you miss three or more appointments without notifying the office, you may be dismissed from the clinic at the provider's discretion.      For prescription refill requests, have your pharmacy contact our office and allow 72 hours for refills to be completed.    Today you received the following chemotherapy and/or immunotherapy agent: Paclitaxel (Taxol)   To help prevent nausea and vomiting after your treatment, we encourage you to take your nausea medication as directed.  BELOW ARE SYMPTOMS THAT SHOULD BE REPORTED IMMEDIATELY: *FEVER GREATER THAN 100.4 F (38 C) OR HIGHER *CHILLS OR SWEATING *NAUSEA AND VOMITING THAT IS NOT CONTROLLED WITH YOUR NAUSEA MEDICATION *UNUSUAL SHORTNESS OF BREATH *UNUSUAL BRUISING OR BLEEDING *URINARY PROBLEMS (pain or burning when urinating, or frequent urination) *BOWEL PROBLEMS (unusual diarrhea, constipation, pain near the anus) TENDERNESS IN MOUTH AND THROAT WITH OR WITHOUT PRESENCE OF ULCERS (sore throat, sores in mouth, or a toothache) UNUSUAL RASH, SWELLING OR PAIN  UNUSUAL VAGINAL DISCHARGE OR ITCHING   Items with * indicate a potential emergency and should be followed up as soon as possible or go to the Emergency Department if any problems should occur.  Please show the CHEMOTHERAPY ALERT CARD or IMMUNOTHERAPY ALERT CARD at  check-in to the Emergency Department and triage nurse.  Should you have questions after your visit or need to cancel or reschedule your appointment, please contact [facility]  Dept: [phone]  and follow the prompts.  Office hours are 8:00 a.m. to 4:30 p.m. Monday - Friday. Please note that voicemails left after 4:00 p.m. may not be returned until the following business day.  We are closed weekends and major holidays. You have access to a nurse at all times for urgent questions. Please call the main number to the clinic Dept: [phone] and follow the prompts.   For any non-urgent questions, you may also contact your provider using MyChart. We now offer e-Visits for anyone 18 and older to request care online for non-urgent symptoms. For details visit [email or web address].   Also download the MyChart app! Go to the app store, search "MyChart", open the app, select [facility], and log in with your MyChart username and password.  Due to Covid, a mask is required upon entering the hospital/clinic. If you do not have a mask, one will be given to you upon arrival. For doctor visits, patients may have 1 support person aged 18 or older with them. For treatment visits, patients cannot have anyone with them due to current Covid guidelines and our immunocompromised population.  

## 2021-10-03 ENCOUNTER — Inpatient Hospital Stay: Payer: 59

## 2021-10-03 ENCOUNTER — Other Ambulatory Visit: Payer: Self-pay

## 2021-10-03 DIAGNOSIS — Z5111 Encounter for antineoplastic chemotherapy: Secondary | ICD-10-CM | POA: Diagnosis not present

## 2021-10-03 DIAGNOSIS — C569 Malignant neoplasm of unspecified ovary: Secondary | ICD-10-CM | POA: Diagnosis not present

## 2021-10-03 DIAGNOSIS — C786 Secondary malignant neoplasm of retroperitoneum and peritoneum: Secondary | ICD-10-CM | POA: Diagnosis not present

## 2021-10-03 DIAGNOSIS — N183 Chronic kidney disease, stage 3 unspecified: Secondary | ICD-10-CM | POA: Diagnosis not present

## 2021-10-03 LAB — CBC WITH DIFFERENTIAL/PLATELET
Abs Immature Granulocytes: 0.03 10*3/uL (ref 0.00–0.07)
Basophils Absolute: 0.1 10*3/uL (ref 0.0–0.1)
Basophils Relative: 1 %
Eosinophils Absolute: 0.2 10*3/uL (ref 0.0–0.5)
Eosinophils Relative: 3 %
HCT: 40.5 % (ref 36.0–46.0)
Hemoglobin: 13.2 g/dL (ref 12.0–15.0)
Immature Granulocytes: 1 %
Lymphocytes Relative: 29 %
Lymphs Abs: 1.6 10*3/uL (ref 0.7–4.0)
MCH: 30.3 pg (ref 26.0–34.0)
MCHC: 32.6 g/dL (ref 30.0–36.0)
MCV: 93.1 fL (ref 80.0–100.0)
Monocytes Absolute: 0.4 10*3/uL (ref 0.1–1.0)
Monocytes Relative: 7 %
Neutro Abs: 3.2 10*3/uL (ref 1.7–7.7)
Neutrophils Relative %: 59 %
Platelets: 332 10*3/uL (ref 150–400)
RBC: 4.35 MIL/uL (ref 3.87–5.11)
RDW: 13.2 % (ref 11.5–15.5)
WBC: 5.4 10*3/uL (ref 4.0–10.5)
nRBC: 0 % (ref 0.0–0.2)

## 2021-10-03 LAB — COMPREHENSIVE METABOLIC PANEL
ALT: 18 U/L (ref 0–44)
AST: 16 U/L (ref 15–41)
Albumin: 4.3 g/dL (ref 3.5–5.0)
Alkaline Phosphatase: 93 U/L (ref 38–126)
Anion gap: 5 (ref 5–15)
BUN: 25 mg/dL — ABNORMAL HIGH (ref 8–23)
CO2: 31 mmol/L (ref 22–32)
Calcium: 9.8 mg/dL (ref 8.9–10.3)
Chloride: 101 mmol/L (ref 98–111)
Creatinine, Ser: 1.15 mg/dL — ABNORMAL HIGH (ref 0.44–1.00)
GFR, Estimated: 53 mL/min — ABNORMAL LOW (ref >60)
Glucose, Bld: 70 mg/dL (ref 70–99)
Potassium: 4.4 mmol/L (ref 3.5–5.1)
Sodium: 137 mmol/L (ref 135–145)
Total Bilirubin: 0.6 mg/dL (ref 0.3–1.2)
Total Protein: 6.9 g/dL (ref 6.5–8.1)

## 2021-10-04 ENCOUNTER — Inpatient Hospital Stay: Payer: 59

## 2021-10-04 VITALS — BP 111/53 | HR 62 | Temp 98.0°F | Resp 16 | Wt 192.8 lb

## 2021-10-04 DIAGNOSIS — Z7189 Other specified counseling: Secondary | ICD-10-CM

## 2021-10-04 DIAGNOSIS — C569 Malignant neoplasm of unspecified ovary: Secondary | ICD-10-CM

## 2021-10-04 DIAGNOSIS — C786 Secondary malignant neoplasm of retroperitoneum and peritoneum: Secondary | ICD-10-CM | POA: Diagnosis not present

## 2021-10-04 DIAGNOSIS — Z5111 Encounter for antineoplastic chemotherapy: Secondary | ICD-10-CM | POA: Diagnosis not present

## 2021-10-04 DIAGNOSIS — N183 Chronic kidney disease, stage 3 unspecified: Secondary | ICD-10-CM | POA: Diagnosis not present

## 2021-10-04 MED ORDER — DEXAMETHASONE SODIUM PHOSPHATE 10 MG/ML IJ SOLN
5.0000 mg | Freq: Once | INTRAMUSCULAR | Status: AC
  Administered 2021-10-04: 5 mg via INTRAVENOUS
  Filled 2021-10-04: qty 1

## 2021-10-04 MED ORDER — SODIUM CHLORIDE 0.9% FLUSH: 10.0000 mL | INTRAVENOUS | Status: DC | PRN

## 2021-10-04 MED ORDER — SODIUM CHLORIDE 0.9 % IV SOLN: Freq: Once | INTRAVENOUS | Status: AC

## 2021-10-04 MED ORDER — FAMOTIDINE IN NACL 20-0.9 MG/50ML-% IV SOLN
20.0000 mg | Freq: Once | INTRAVENOUS | Status: AC
  Administered 2021-10-04: 20 mg via INTRAVENOUS
  Filled 2021-10-04: qty 50

## 2021-10-04 MED ORDER — SODIUM CHLORIDE 0.9 % IV SOLN
60.0000 mg/m2 | Freq: Once | INTRAVENOUS | Status: AC
  Administered 2021-10-04: 126 mg via INTRAVENOUS
  Filled 2021-10-04: qty 21

## 2021-10-04 MED ORDER — HEPARIN SOD (PORK) LOCK FLUSH 100 UNIT/ML IV SOLN: 500.0000 [IU] | Freq: Once | INTRAVENOUS | Status: DC | PRN

## 2021-10-04 MED ORDER — DIPHENHYDRAMINE HCL 50 MG/ML IJ SOLN
25.0000 mg | Freq: Once | INTRAMUSCULAR | Status: AC
  Administered 2021-10-04: 25 mg via INTRAVENOUS
  Filled 2021-10-04: qty 1

## 2021-10-04 NOTE — Patient Instructions (Signed)
[facility]   Discharge Instructions: Thank you for choosing [facility] to provide your oncology and hematology care.   If you have a lab appointment with the Cancer Center, please go directly to the Cancer Center and check in at the registration area.   Wear comfortable clothing and clothing appropriate for easy access to any Portacath or PICC line.   We strive to give you quality time with your provider. You may need to reschedule your appointment if you arrive late (15 or more minutes).  Arriving late affects you and other patients whose appointments are after yours.  Also, if you miss three or more appointments without notifying the office, you may be dismissed from the clinic at the provider's discretion.      For prescription refill requests, have your pharmacy contact our office and allow 72 hours for refills to be completed.    Today you received the following chemotherapy and/or immunotherapy agents: paclitaxel.      To help prevent nausea and vomiting after your treatment, we encourage you to take your nausea medication as directed.  BELOW ARE SYMPTOMS THAT SHOULD BE REPORTED IMMEDIATELY: *FEVER GREATER THAN 100.4 F (38 C) OR HIGHER *CHILLS OR SWEATING *NAUSEA AND VOMITING THAT IS NOT CONTROLLED WITH YOUR NAUSEA MEDICATION *UNUSUAL SHORTNESS OF BREATH *UNUSUAL BRUISING OR BLEEDING *URINARY PROBLEMS (pain or burning when urinating, or frequent urination) *BOWEL PROBLEMS (unusual diarrhea, constipation, pain near the anus) TENDERNESS IN MOUTH AND THROAT WITH OR WITHOUT PRESENCE OF ULCERS (sore throat, sores in mouth, or a toothache) UNUSUAL RASH, SWELLING OR PAIN  UNUSUAL VAGINAL DISCHARGE OR ITCHING   Items with * indicate a potential emergency and should be followed up as soon as possible or go to the Emergency Department if any problems should occur.  Please show the CHEMOTHERAPY ALERT CARD or IMMUNOTHERAPY ALERT CARD at check-in  to the Emergency Department and triage nurse.  Should you have questions after your visit or need to cancel or reschedule your appointment, please contact [facility]  Dept: [phone]  and follow the prompts.  Office hours are 8:00 a.m. to 4:30 p.m. Monday - Friday. Please note that voicemails left after 4:00 p.m. may not be returned until the following business day.  We are closed weekends and major holidays. You have access to a nurse at all times for urgent questions. Please call the main number to the clinic Dept: [phone] and follow the prompts.   For any non-urgent questions, you may also contact your provider using MyChart. We now offer e-Visits for anyone 18 and older to request care online for non-urgent symptoms. For details visit [email or web address].   Also download the MyChart app! Go to the app store, search "MyChart", open the app, select [place], and log in with your MyChart username and password.  Due to Covid, a mask is required upon entering the hospital/clinic. If you do not have a mask, one will be given to you upon arrival. For doctor visits, patients may have 1 support person aged 18 or older with them. For treatment visits, patients cannot have anyone with them due to current Covid guidelines and our immunocompromised population.   

## 2021-10-10 ENCOUNTER — Other Ambulatory Visit (HOSPITAL_COMMUNITY): Payer: Self-pay

## 2021-10-15 ENCOUNTER — Telehealth: Payer: Self-pay

## 2021-10-16 ENCOUNTER — Telehealth: Payer: Self-pay

## 2021-10-16 ENCOUNTER — Encounter (HOSPITAL_COMMUNITY): Payer: Self-pay

## 2021-10-16 ENCOUNTER — Ambulatory Visit (HOSPITAL_COMMUNITY)
Admission: RE | Admit: 2021-10-16 | Discharge: 2021-10-16 | Disposition: A | Discharge status: Home / Self Care | Payer: 59 | Source: Ambulatory Visit | Attending: Hematology and Oncology | Admitting: Hematology and Oncology

## 2021-10-16 ENCOUNTER — Other Ambulatory Visit: Payer: Self-pay

## 2021-10-16 DIAGNOSIS — N281 Cyst of kidney, acquired: Secondary | ICD-10-CM | POA: Diagnosis not present

## 2021-10-16 DIAGNOSIS — I2089 Other forms of angina pectoris: Secondary | ICD-10-CM

## 2021-10-16 DIAGNOSIS — I700 Atherosclerosis of aorta: Secondary | ICD-10-CM

## 2021-10-16 DIAGNOSIS — R072 Precordial pain: Secondary | ICD-10-CM

## 2021-10-16 DIAGNOSIS — N183 Chronic kidney disease, stage 3 unspecified: Secondary | ICD-10-CM

## 2021-10-16 DIAGNOSIS — E785 Hyperlipidemia, unspecified: Secondary | ICD-10-CM

## 2021-10-16 DIAGNOSIS — C569 Malignant neoplasm of unspecified ovary: Secondary | ICD-10-CM

## 2021-10-16 DIAGNOSIS — C786 Secondary malignant neoplasm of retroperitoneum and peritoneum: Secondary | ICD-10-CM | POA: Diagnosis not present

## 2021-10-16 DIAGNOSIS — K439 Ventral hernia without obstruction or gangrene: Secondary | ICD-10-CM | POA: Diagnosis not present

## 2021-10-16 DIAGNOSIS — Z5111 Encounter for antineoplastic chemotherapy: Secondary | ICD-10-CM | POA: Diagnosis not present

## 2021-10-16 DIAGNOSIS — Z8543 Personal history of malignant neoplasm of ovary: Secondary | ICD-10-CM | POA: Diagnosis not present

## 2021-10-16 DIAGNOSIS — I208 Other forms of angina pectoris: Secondary | ICD-10-CM

## 2021-10-16 LAB — CBC WITH DIFFERENTIAL/PLATELET
Abs Immature Granulocytes: 0.01 10*3/uL (ref 0.00–0.07)
Basophils Absolute: 0 10*3/uL (ref 0.0–0.1)
Basophils Relative: 1 %
Eosinophils Absolute: 0.1 10*3/uL (ref 0.0–0.5)
Eosinophils Relative: 2 %
HCT: 35.5 % — ABNORMAL LOW (ref 36.0–46.0)
Hemoglobin: 12 g/dL (ref 12.0–15.0)
Immature Granulocytes: 0 %
Lymphocytes Relative: 35 %
Lymphs Abs: 1.9 10*3/uL (ref 0.7–4.0)
MCH: 31.1 pg (ref 26.0–34.0)
MCHC: 33.8 g/dL (ref 30.0–36.0)
MCV: 92 fL (ref 80.0–100.0)
Monocytes Absolute: 0.6 10*3/uL (ref 0.1–1.0)
Monocytes Relative: 11 %
Neutro Abs: 2.9 10*3/uL (ref 1.7–7.7)
Neutrophils Relative %: 51 %
Platelets: 309 10*3/uL (ref 150–400)
RBC: 3.86 MIL/uL — ABNORMAL LOW (ref 3.87–5.11)
RDW: 14.2 % (ref 11.5–15.5)
WBC: 5.6 10*3/uL (ref 4.0–10.5)
nRBC: 0 % (ref 0.0–0.2)

## 2021-10-16 LAB — COMPREHENSIVE METABOLIC PANEL
ALT: 21 U/L (ref 0–44)
AST: 19 U/L (ref 15–41)
Albumin: 4.1 g/dL (ref 3.5–5.0)
Alkaline Phosphatase: 73 U/L (ref 38–126)
Anion gap: 7 (ref 5–15)
BUN: 22 mg/dL (ref 8–23)
CO2: 27 mmol/L (ref 22–32)
Calcium: 9.3 mg/dL (ref 8.9–10.3)
Chloride: 101 mmol/L (ref 98–111)
Creatinine, Ser: 1.06 mg/dL — ABNORMAL HIGH (ref 0.44–1.00)
GFR, Estimated: 59 mL/min — ABNORMAL LOW (ref >60)
Glucose, Bld: 81 mg/dL (ref 70–99)
Potassium: 4 mmol/L (ref 3.5–5.1)
Sodium: 135 mmol/L (ref 135–145)
Total Bilirubin: 0.6 mg/dL (ref 0.3–1.2)
Total Protein: 6.5 g/dL (ref 6.5–8.1)

## 2021-10-16 MED ORDER — IOHEXOL 300 MG/ML  SOLN
100.0000 mL | Freq: Once | INTRAMUSCULAR | Status: AC | PRN
  Administered 2021-10-16: 100 mL via INTRAVENOUS

## 2021-10-16 MED ORDER — IOHEXOL 9 MG/ML PO SOLN
ORAL | Status: AC
  Filled 2021-10-16: qty 1000

## 2021-10-16 MED ORDER — SODIUM CHLORIDE (PF) 0.9 % IJ SOLN
INTRAMUSCULAR | Status: AC
  Filled 2021-10-16: qty 50

## 2021-10-17 ENCOUNTER — Inpatient Hospital Stay: Payer: 59

## 2021-10-18 ENCOUNTER — Other Ambulatory Visit (HOSPITAL_COMMUNITY): Payer: Self-pay

## 2021-10-18 ENCOUNTER — Inpatient Hospital Stay (HOSPITAL_BASED_OUTPATIENT_CLINIC_OR_DEPARTMENT_OTHER): Payer: 59 | Admitting: Hematology and Oncology

## 2021-10-18 ENCOUNTER — Inpatient Hospital Stay: Payer: 59

## 2021-10-18 ENCOUNTER — Encounter: Payer: Self-pay | Admitting: Hematology and Oncology

## 2021-10-18 DIAGNOSIS — C569 Malignant neoplasm of unspecified ovary: Secondary | ICD-10-CM | POA: Diagnosis not present

## 2021-10-18 DIAGNOSIS — Z7189 Other specified counseling: Secondary | ICD-10-CM

## 2021-10-18 DIAGNOSIS — I878 Other specified disorders of veins: Secondary | ICD-10-CM | POA: Diagnosis not present

## 2021-10-18 DIAGNOSIS — N183 Chronic kidney disease, stage 3 unspecified: Secondary | ICD-10-CM

## 2021-10-18 DIAGNOSIS — C786 Secondary malignant neoplasm of retroperitoneum and peritoneum: Secondary | ICD-10-CM | POA: Diagnosis not present

## 2021-10-18 DIAGNOSIS — Z5111 Encounter for antineoplastic chemotherapy: Secondary | ICD-10-CM | POA: Diagnosis not present

## 2021-10-18 MED ORDER — ANASTROZOLE 1 MG PO TABS
1.0000 mg | ORAL_TABLET | Freq: Every day | ORAL | 3 refills | Status: DC
  Filled 2021-10-18: qty 30, 30d supply, fill #0
  Filled 2021-12-03: qty 30, 30d supply, fill #1

## 2021-10-18 NOTE — Progress Notes (Signed)
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) I have reviewed multiple imaging studies with the patient Overall, she have stable disease control She is developing multiple side effects from chemotherapy We discussed hormonal manipulation with antiestrogen approach Previously, she had mixed response with Lupron I recommend resumption of Lupron with additional aromatase inhibitor to control her disease She is interested to try We will get her scheduled to start treatment next month I recommend minimum 4 months of treatment before repeating imaging study around end of November  CKD (chronic kidney disease), stage III Her renal function is stable Observe closely  Poor venous access She has very poor venous access with recurrent phlebitis We discussed discontinuation of paclitaxel and switching her over to Lupron injection with AI and she is in agreement  Goals of care, counseling/discussion We discussed goals of care I explained to the patient why surgery is not indicated in the treatment goal is lifelong  No orders of the defined types were placed in this encounter.   All questions were answered. The patient knows to call the clinic with any problems, questions or concerns. The total time spent in the appointment was 40 minutes encounter with patients including review of chart and various tests results, discussions about plan of care and coordination of care plan   Ni [name], MD 10/18/2021 11:35 AM  INTERVAL HISTORY: Please see below for problem oriented charting. 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 Denies abdominal pain or changes in bowel habits  REVIEW  OF SYSTEMS:   Constitutional: Denies fevers, chills or abnormal weight loss Eyes: Denies blurriness of vision Ears, nose, mouth, throat, and face: Denies mucositis or sore throat Respiratory: Denies cough, dyspnea or wheezes Cardiovascular: Denies palpitation, chest discomfort or lower extremity swelling Gastrointestinal:  Denies nausea, heartburn or change in bowel habits Skin: Denies abnormal skin rashes Lymphatics: Denies new lymphadenopathy or easy bruising Neurological:Denies numbness, tingling or new weaknesses Behavioral/Psych: Mood is stable, no new changes  All other systems were reviewed with the patient and are negative.  I have reviewed the past medical history, past surgical history, social history and family history with the patient and they are unchanged from previous note.  ALLERGIES:  is allergic to codeine, thimerosal, ciprofloxacin, daypro [oxaprozin], levofloxacin, stadol [butorphanol tartrate], and sulfa antibiotics.  MEDICATIONS:  Current Outpatient Medications  Medication Sig Dispense Refill   anastrozole (ARIMIDEX) 1 MG tablet Take 1 tablet (1 mg total) by mouth daily. 30 tablet 3   acetaminophen (TYLENOL) 500 MG tablet Take 1,000 mg by mouth every 6 (six) hours as needed for moderate pain.     aspirin EC 81 MG tablet Take 81 mg by mouth daily. Swallow whole.     atorvastatin (LIPITOR) 40 MG tablet Take 1 tablet (40 mg total) by mouth daily. 90 tablet 3   Biotin 5 MG TABS Take 5 mg by mouth daily.     buPROPion (WELLBUTRIN SR) 200 MG 12 hr tablet Take 1 tablet (200 mg total) by mouth 2 (two) times daily. 180 tablet 0   Ca Phosphate-Cholecalciferol (EQL CALCIUM GUMMIES) 250-400 MG-UNIT CHEW Chew 1 tablet by mouth 2 (two) times daily.     escitalopram (LEXAPRO) 20 MG tablet Take   1 tablet (20 mg total) by mouth daily. 90 tablet 3   gabapentin (NEURONTIN) 300 MG capsule Take 1 capsule (300 mg total) by mouth 2 (two) times daily. 60 capsule 3   HYDROmorphone (DILAUDID)  2 MG tablet Take 1 tablet by mouth every 4 (four) hours as needed for severe pain. 30 tablet 0   LORazepam (ATIVAN) 0.5 MG tablet Take 1 tablet (0.5 mg total) by mouth 2 (two) times daily as needed for anxiety. 30 tablet 0   metFORMIN (GLUCOPHAGE) 500 MG tablet Take 1 tablet by mouth every morning and 2 tablets every evening as directed (need office visit for refills) 270 tablet 0   mirabegron ER (MYRBETRIQ) 50 MG TB24 tablet Take 1 tablet by mouth once daily 90 tablet 3   nitroGLYCERIN (NITROSTAT) 0.4 MG SL tablet Place 1 tablet (0.4 mg total) under the tongue every 5 (five) minutes as needed for chest pain. 25 tablet 3   omeprazole (PRILOSEC) 20 MG capsule Take 1 capsule by mouth once a day 90 capsule 3   ondansetron (ZOFRAN) 8 MG tablet Take 1 tablet (8 mg total) by mouth every 8 (eight) hours as needed for nausea or vomiting. 60 tablet 0   PACLitaxel (TAXOL IV) Inject into the vein.     Polyethyl Glycol-Propyl Glycol (SYSTANE OP) Place 1 drop into both eyes daily as needed (dry eyes).     Prenatal MV & Min w/FA-DHA (PRENATAL GUMMIES) 0.18-25 MG CHEW Chew 1 capsule by mouth daily.     Probiotic Product (ALIGN PO) Take 1 tablet by mouth daily.      prochlorperazine (COMPAZINE) 10 MG tablet Take 1 tablet by mouth every 6 hours as needed for nausea or vomiting. 30 tablet 1   Vitamin D, Ergocalciferol, (DRISDOL) 1.25 MG (50000 UNIT) CAPS capsule Take 1 capsule (50,000 Units total) by mouth every 7 (seven) days. (Need Office Visit) 12 capsule 0   No current facility-administered medications for this visit.    SUMMARY OF ONCOLOGIC HISTORY: Oncology History Overview Note  2018 - Core biopsy for ER/PR and Foundation One testing performed. Unfortunately, no sufficient tissue for Foundation One testing. ER was 50% and PR 90%. 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     - 09/2000 Chemotherapy   6 cycles of IP cisplatin and etoposide    02/2009 Relapse/Recurrence   CT showed increased size of nodules in pelvis   2010 Surgery   Exlap with section of tumor nodules near cecum and left pelvic sidewall. Tumor: ER negative, PR positive    Treatment Plan Change   Alternated 2 week courses of Megace and Tamoxifen - ended 03/2012   03/2012 PET scan   CT - progressive disease   2013 Treatment Plan Change   Letrozole   01/2016 Imaging   MRI showed progressive disease   2017 Treatment Plan Change   Two weeks of alternating Tamoxifen 20mg daily and then Megace 40mg TID   08/2016 Imaging     MRI showed progressive disease with peritoneal implants near liver, in pelvis   01/2017 Treatment Plan Change   Lupron 11.25 q 3 months   11/2017 Imaging   Overall mixed response.   Mixed cystic/solid lesions in the left pelvis are mildly improved.   Cystic peritoneal disease, including the dominant lesion along the posterior right hepatic lobe, is mildly progressed.   Subcapsular lesion along the posterior right hepatic lobe is unchanged.   03/2018 Imaging   MRI A/P: Mixed response of individual peritoneal metastases in the pelvis, as described above. Overall, there has been no significant change in bulk of disease.   Stable cystic peritoneal metastatic disease along the capsular surfaces of the liver and spleen.   No new sites of metastatic disease identified within  the abdomen or pelvis.   08/2018 Imaging   MRI A/P: Status post hysterectomy and bilateral salpingo-oophorectomy.   Mixed cystic/solid peritoneal implants in the abdomen/pelvis, as above. Dominant cystic implant along the posterior liver surface is mildly increased. Remaining lesions are overall grossly unchanged.   No new lesions are identified.   01/28/2019 Imaging   Mri A/P: 1. Relatively similar appearance of peritoneal metastasis. A posterior right hepatic capsular based lesion is similar to minimally decreased in size. Left pelvic implants are primarily similar with possible enlargement of an anterior high left pelvic cystic implant. No new disease identified. 2.  Aortic Atherosclerosis (ICD10-I70.0). 3. Hepatic steatosis. 4. Left adrenal adenoma.   06/02/2019 Imaging   MRI 1. Potential slight enlargement of dominant cystic area and solid component, associated with rind like signal variation on T2 along the inferior right hepatic margin, also potentially slightly increased. Findings may still be within the realm of measurement and technical variation. Close attention on follow-up. 2. Subtle cystic changes along the cephalad margin of the spleen are difficult to see on previous imaging, perhaps new compared with prior imaging studies. 3. Pelvic implants and left lower quadrant lesion with similar size, of the area along the left iliac vasculature may be slightly larger than on the prior study. 4. Signs of extensive retroperitoneal and pelvic lymphadenectomy. 5. Hepatic steatosis. 6. 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     06/08/2019 Cancer Staging   Staging form: Ovary, AJCC 7th Edition - Clinical: Stage IIIC (rT2, N1, M0) - Signed by [name], Ni, MD on 06/08/2019   06/10/2019 Imaging   1. Multiple redemonstrated partially solid metastatic implants in the hepatorenal recess, left paracolic gutter, left pelvis, and likely the tip of  the spleen as detailed above and as seen on recent prior MRI dated 06/02/2019. These findings are slightly worsened in comparison to a remote prior CT examination dated 10/04/2014.   2.  No evidence of metastatic disease in the chest.   3. Status post hysterectomy, pelvic and retroperitoneal lymph node dissection, and ventral hernia mesh repair.   4.  Hepatic steatosis.   5.  Aortic Atherosclerosis (ICD10-I70.0).   06/24/2019 - 09/02/2019 Chemotherapy   The patient had carboplatin for chemotherapy treatment.     09/02/2019 Tumor Marker   Patient's tumor was tested for the following markers: Inhibin B Results of the tumor marker test revealed 94   09/23/2019 Imaging   1. Interval progression of the soft tissue lesions in the anterior left pelvis, likely peritoneal implants, compatible with disease progression. Remaining sites of apparent disease along the liver capsule and posterior spleen are stable 2. Stable 2 cm left adrenal adenoma. 3. Hepatic   steatosis. 4. Right-side predominant colonic diverticulosis without diverticulitis. 5. Aortic Atherosclerosis (ICD10-I70.0).   12/23/2019 Imaging   1. Slight interval increase in size of a mixed solid and cystic nodule in the left hemipelvis measuring 3.0 x 2.9 cm, previously 2.7 x 2.1 cm. 2. Interval decrease in size of a nodule in the left paracolic gutter measuring 1.0 x 0.8 cm, previously 2.1 x 1.6 cm. 3. Stable peritoneal nodules in the hepatorenal recess and at the inferior tip of the spleen. 4. Unchanged nodule or lymph node overlying the left external iliac artery. 5. Enlargement of dominant left pelvic nodule is concerning for disease progression despite interval decrease in size of a nodule in the left paracolic gutter and stability of other nodules. 6. No evidence of metastatic disease in the chest. 7. Stable, benign left adrenal adenoma. 8. Ventral hernia mesh repair with a small component of recurrent hernia inferiorly, containing a  single nonobstructed loop of small bowel. 9. Hepatic steatosis. 10. Aortic Atherosclerosis (ICD10-I70.0).   12/23/2019 Tumor Marker   Patient's tumor was tested for the following markers: Inhibin B Results of the tumor marker test revealed 94.9   01/25/2020 Tumor Marker   Patient's tumor was tested for the following markers: Inhibin B Results of the tumor marker test revealed 116.7   02/20/2020 Genetic Testing   Negative genetic testing: no pathogenic variants detected in [facility] Multi-Cancer Panel.  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  The report date is February 20, 2020.   The Multi-Cancer Panel offered by [facility] includes sequencing and/or deletion duplication testing of the following 85 genes: AIP, ALK, APC, ATM, AXIN2,BAP1,  BARD1, BLM, BMPR1A, BRCA1, BRCA2, BRIP1, CASR, CDC73, CDH1, CDK4, CDKN1B, CDKN1C, CDKN2A (p14ARF), CDKN2A (p16INK4a), CEBPA, CHEK2, CTNNA1, DICER1, DIS3L2, EGFR (c.2369C>T, p.Thr790Met variant only), EPCAM (Deletion/duplication testing only), FH, FLCN, GATA2, GPC3, GREM1 (Promoter region deletion/duplication testing only), HOXB13 (c.251G>A, p.Gly84Glu), HRAS, KIT, MAX, MEN1, MET, MITF (c.952G>A, p.Glu318Lys variant only), MLH1, MSH2, MSH3, MSH6, MUTYH, NBN, NF1, NF2, NTHL1, PALB2, PDGFRA, PHOX2B, PMS2, POLD1, POLE, POT1, PRKAR1A, PTCH1, PTEN, RAD50, RAD51C, RAD51D, RB1, RECQL4, RET, RNF43, RUNX1, SDHAF2, SDHA (sequence changes only), SDHB, SDHC, SDHD, SMAD4, SMARCA4, SMARCB1, SMARCE1, STK11, SUFU, TERC, TERT, TMEM127, TP53, TSC1, TSC2, VHL, WRN and WT1.    03/02/2020 Tumor Marker   Patient's tumor was tested for the following markers: Inhibin B Results of the tumor marker test revealed 80.8   04/17/2020 Imaging   1. Overall, exam is stable. Multiple peritoneal nodules are again seen. The index nodule in the left lower quadrant is mildly increased in size in the interval. The lesion within the anterior left pelvis has decreased in size in  the interval. There has also been decrease in size of cystic lesion within the a hepatorenal recess. The remaining peritoneal lesions are unchanged. No new lesions identified. 2. Stable left adrenal nodule. 3.  Aortic Atherosclerosis (ICD10-I70.0).     08/21/2020 Imaging   Bone density is normal AP spine T score 0.8 Femoral neck on the left, T score -0.2 Femoral neck on the right ,T score -0.4   10/17/2020 Imaging   1. Interval progression of peritoneal nodules along the left pelvic sidewall, concerning for progressive metastatic disease. 2. Small capsular lesion medial right liver and the apparent cystic lesion superior to the right kidney are similar to prior. 3. Tiny soft tissue nodule anterior aspect of the lateral left pelvis described as decreasing on the prior study has decreased further on today's exam. 4. Stable left adrenal nodule.  This cannot be definitively characterized. 5. Tiny nonobstructing stone lower pole right kidney. 6. Aortic Atherosclerosis (ICD10-I70.0).   10/26/2020 Procedure   Successful placement of a right internal jugular approach power injectable Port-A-Cath. The catheter is ready for immediate use.       10/31/2020 - 10/04/2021 Chemotherapy   Patient is on Treatment Plan : OVARIAN Paclitaxel D1,8,15,22 q28d     01/25/2021 Imaging   1. Signs of peritoneal disease with scattered peritoneal nodules. The index lesions are either stable or decreased in size in the interval as detailed above. No new sites of disease. 2. Ventral pelvic wall hernia contains a nonobstructed loop of small bowel. 3. Aortic Atherosclerosis (ICD10-I70.0).     06/14/2021 Imaging   1. Multiple peritoneal metastatic nodules are slightly diminished in size. 2. A left external iliac lymph node or nodule is however stable to slightly enlarged. 3. Overall findings are most consistent with continued treatment response of metastatic disease. 4. No evidence of metastatic disease within the  chest. 5. Hepatic steatosis.     06/14/2021 Imaging   Right: Findings consistent with 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   Left: No evidence of thrombosis in the subclavian.   06/20/2021 Procedure   Successful right IJ vein Port-A-Cath explant.     10/18/2021 Imaging   1. Similar to mild interval increase in size of left external iliac lymph node. 2. Multiple peritoneal metastatic nodules are grossly unchanged in size when compared to prior exam     PHYSICAL EXAMINATION: ECOG PERFORMANCE STATUS: 1 - Symptomatic but completely ambulatory  Vitals:   10/18/21 0826  BP: (!) 136/59  Pulse: 72  Resp: 18  Temp: (!) 97.4 F (36.3 C)  SpO2: 100%   Filed Weights   10/18/21 0826  Weight: 196 lb (88.9 kg)    GENERAL:alert, no distress and comfortable NEURO: alert & oriented x 3 with fluent speech, no focal motor/sensory deficits  LABORATORY DATA:  I have reviewed the data as listed    Component Value Date/Time   NA 135 10/16/2021 1536   NA 140 07/09/2021 0813   NA 141 09/24/2013 1131   M[facility]ley 4.0 10/16/2021 1536   Mosley 4.5 09/24/2013 1131   CL 101 10/16/2021 1536   CO2 27 10/16/2021 1536   CO2 24 09/24/2013 1131   GLUCOSE 81 10/16/2021 1536   GLUCOSE 90 09/24/2013 1131   BUN 22 10/16/2021 1536   BUN 20 07/09/2021 0813   BUN 21.2 09/24/2013 1131   CREATININE 1.06 (H) 10/16/2021 1536   CREATININE 1.19 (H) 06/20/2021 1513   CREATININE 1.1 09/24/2013 1131   CALCIUM 9.3 10/16/2021 1536   CALCIUM 9.5 09/24/2013 1131   PROT 6.5 10/16/2021 1536   PROT 6.2 07/09/2021 0813   PROT 6.8 09/24/2013 1131   ALBUMIN 4.1 10/16/2021 1536   ALBUMIN 4.3 07/09/2021 0813   ALBUMIN 3.9 09/24/2013 1131   AST 19 10/16/2021 1536   AST 21 06/20/2021 1513   AST 23 09/24/2013 1131   ALT 21 10/16/2021 1536   ALT 19 06/20/2021 1513   ALT 27 09/24/2013 1131   ALKPHOS 73 10/16/2021 1536   ALKPHOS 81 09/24/2013 1131   BILITOT  0.6 10/16/2021 1536   BILITOT 0.3 07/09/2021 0813   BILITOT 0.4 06/20/2021 1513   BILITOT 0.53 09/24/2013 1131   GFRNONAA 59 (L) 10/16/2021 1536   GFRNONAA 51 (L) 06/20/2021 1513   GFRAA 55 (L) 01/24/2020 1530   GFRAA 57 (L)   12/21/2019 1259    No results found for: "SPEP", "UPEP"  Lab Results  Component Value Date   WBC 5.6 10/16/2021   NEUTROABS 2.9 10/16/2021   HGB 12.0 10/16/2021   HCT 35.5 (L) 10/16/2021   MCV 92.0 10/16/2021   PLT 309 10/16/2021      Chemistry      Component Value Date/Time   NA 135 10/16/2021 1536   NA 140 07/09/2021 0813   NA 141 09/24/2013 1131   Mosley 4.0 10/16/2021 1536   Mosley 4.5 09/24/2013 1131   CL 101 10/16/2021 1536   CO2 27 10/16/2021 1536   CO2 24 09/24/2013 1131   BUN 22 10/16/2021 1536   BUN 20 07/09/2021 0813   BUN 21.2 09/24/2013 1131   CREATININE 1.06 (H) 10/16/2021 1536   CREATININE 1.19 (H) 06/20/2021 1513   CREATININE 1.1 09/24/2013 1131      Component Value Date/Time   CALCIUM 9.3 10/16/2021 1536   CALCIUM 9.5 09/24/2013 1131   ALKPHOS 73 10/16/2021 1536   ALKPHOS 81 09/24/2013 1131   AST 19 10/16/2021 1536   AST 21 06/20/2021 1513   AST 23 09/24/2013 1131   ALT 21 10/16/2021 1536   ALT 19 06/20/2021 1513   ALT 27 09/24/2013 1131   BILITOT 0.6 10/16/2021 1536   BILITOT 0.3 07/09/2021 0813   BILITOT 0.4 06/20/2021 1513   BILITOT 0.53 09/24/2013 1131       RADIOGRAPHIC STUDIES: I have reviewed multiple CT imaging with the patient I have personally reviewed the radiological images as listed and agreed with the findings in the report. CT ABDOMEN PELVIS W CONTRAST  Result Date: 10/17/2021 CLINICAL DATA:  History of ovarian cancer. Follow-up exam. * Tracking Code: BO * EXAM: CT ABDOMEN AND PELVIS WITH CONTRAST TECHNIQUE: Multidetector CT imaging of the abdomen and pelvis was performed using the standard protocol following bolus administration of intravenous contrast. RADIATION DOSE REDUCTION: This exam was performed  according to the departmental dose-optimization program which includes automated exposure control, adjustment of the mA and/or kV according to patient size and/or use of iterative reconstruction technique. CONTRAST:  100mL OMNIPAQUE IOHEXOL 300 MG/ML  SOLN COMPARISON:  CT abdomen pelvis 06/12/2021 FINDINGS: Lower chest: Normal heart size. Dependent atelectasis. No pleural effusion. Hepatobiliary: Liver is normal in size and contour. No focal hepatic lesions identified. Gallbladder is unremarkable. No intrahepatic or extrahepatic biliary ductal dilatation. Pancreas: Unremarkable Spleen: Similar-appearing 1.4 x 1.2 cm exophytic low-attenuation lesion off the spleen. Adrenals/Urinary Tract: Unchanged 1.9 cm left adrenal adenoma (image 27; series 2). Normal right adrenal gland. Kidneys enhance symmetrically with contrast. Unchanged bilateral renal cysts. Urinary bladder is unremarkable. Stomach/Bowel: No abnormal bowel wall thickening or evidence for bowel obstruction Vascular/Lymphatic: Normal caliber abdominal aorta. Peripheral calcified atherosclerotic plaque. Slight interval increase in size of left external iliac lymph node measuring 1.6 x 1.6 cm (image 66; series 2), previously 1.4 x 1 4 cm. Reproductive: Prior hysterectomy.  No pelvic masses. Other: Unchanged small lower midline ventral abdominal wall hernia containing nonobstructed small bowel. Unchanged left pelvic sidewall nodule measuring 1.1 x 1.5 cm (image 79; series 2), previously 1.4 x 1.1 cm. Unchanged 0.8 cm nodule within the left pericolic gutter (image 69; series 2). Similar appearing hepato renal recess nodule measuring 1.5 x 0.8 cm (image 26; series 2), previously 1.6 x 0.7 cm. Similar-appearing cystic mass along the posterior right hepatic margin measuring 2.9 x 2.9 cm (image 28; series 2), previously 2.8 x 2.9 cm. Musculoskeletal: No acute or significant osseous   findings. IMPRESSION: 1. Similar to mild interval increase in size of left external  iliac lymph node. 2. Multiple peritoneal metastatic nodules are grossly unchanged in size when compared to prior exam. Electronically Signed   By: Drew  Davis M.D.   On: 10/17/2021 15:50   CT CORONARY MORPH W/CTA COR W/SCORE W/CA W/CM &/OR WO/CM  Addendum Date: 09/21/2021   ADDENDUM REPORT: 09/21/2021 16:26 EXAM: OVER-READ INTERPRETATION  CT CHEST The following report is an over-read performed by radiologist Dr. William Derryof [facility] on 09/21/2021. This over-read does not include interpretation of cardiac or coronary anatomy or pathology. The coronary calcium score/coronary CTA interpretation by the cardiologist is attached. COMPARISON:  CT chest dated June 12, 2021. FINDINGS: Vascular: There are no significant vascular findings. Mediastinum/Nodes: There are no enlarged lymph nodes.The visualized esophagus demonstrates no significant findings. Lungs/Pleura: Clear lungs. No pneumothorax or pleural effusion. Upper abdomen: No acute abnormality. Musculoskeletal/Chest wall: No chest wall abnormality. No acute or significant osseous findings. IMPRESSION: 1. No significant extracardiac findings. Electronically Signed   By: William T Derry M.D.   On: 09/21/2021 16:26   Result Date: 09/21/2021 CLINICAL DATA:  Chest pain EXAM: Cardiac CTA MEDICATIONS: Sub lingual nitro.  4mg TECHNIQUE: The patient was scanned on a Siemens Force 192 slice scanner. Gantry rotation speed was 250 msecs. Collimation was .6 mm. A 100 kV prospective scan was triggered in the ascending thoracic aorta at 140 HU's Full mA was used between 35% and 75% of the R-R interval. Average HR during the scan was bpm. The 3D data set was interpreted on a dedicated work station using MPR, MIP and VRT modes. A total of 80 cc of contrast was used. FINDINGS: Non-cardiac: See separate report from [facility]. No significant findings on limited lung and soft tissue windows. Calcium Score: No calcium noted Coronary Arteries: Right  dominant with no anomalies LM: Normal LAD: Normal D1: Normal D2: Normal Circumflex: Normal small vessel primarily AV groove RCA: Normal PDA: Normal PLA: Normal large 3 branches supplies posterior lateral wall IMPRESSION: 1. Calcium score 0 2.  Ascending thoracic aorta 3.7 cm 3.  Normal right dominant coronary arteries Peter Nishan Electronically Signed: By: Peter  Nishan M.D. On: 09/21/2021 16:13   ECHOCARDIOGRAM COMPLETE  Result Date: 09/21/2021    ECHOCARDIOGRAM REPORT   Patient Name:   [name]  Date of Exam: 09/21/2021 Medical Rec #:  [id number]     Height:       66.0 in Accession #:    [id number]    Weight:       192.5 lb Date of Birth:  11/04/1955     BSA:          1.968 m Patient Age:    65 years      BP:           131/73 mmHg Patient Gender: F             HR:           64 bpm. Exam Location:  [address] Procedure: 2D Echo, 3D Echo, Cardiac Doppler, Color Doppler and Strain Analysis Indications:    R07.9* Chest pain, unspecified  History:        Patient has no prior history of Echocardiogram examinations.                 Risk Factors:Dyslipidemia and Sleep Apnea. Chronic kidney                 disease. Prediabetes.   Precordial pain. Aortic atherosclerosis.                 Obesity.  Sonographer:    Diamond Nickel RCS Referring Phys: [id number] [place]  1. Left ventricular ejection fraction, by estimation, is 60 to 65%. The left ventricle has normal function. The left ventricle has no regional wall motion abnormalities. Left ventricular diastolic parameters are consistent with Grade I diastolic dysfunction (impaired relaxation).  2. Right ventricular systolic function is normal. The right ventricular size is normal. There is normal pulmonary artery systolic pressure. The estimated right ventricular systolic pressure is 45.4 mmHg.  3. The mitral valve is normal in structure. Mild mitral valve regurgitation. No evidence of mitral stenosis.  4. The aortic valve is normal in structure. Aortic  valve regurgitation is not visualized. No aortic stenosis is present.  5. The inferior vena cava is normal in size with greater than 50% respiratory variability, suggesting right atrial pressure of 3 mmHg. FINDINGS  Left Ventricle: Left ventricular ejection fraction, by estimation, is 60 to 65%. The left ventricle has normal function. The left ventricle has no regional wall motion abnormalities. The left ventricular internal cavity size was normal in size. There is  no left ventricular hypertrophy. Left ventricular diastolic parameters are consistent with Grade I diastolic dysfunction (impaired relaxation). Right Ventricle: The right ventricular size is normal. No increase in right ventricular wall thickness. Right ventricular systolic function is normal. There is normal pulmonary artery systolic pressure. The tricuspid regurgitant velocity is 2.28 m/s, and  with an assumed right atrial pressure of 3 mmHg, the estimated right ventricular systolic pressure is 09.8 mmHg. Left Atrium: Left atrial size was normal in size. Right Atrium: Right atrial size was normal in size. Pericardium: There is no evidence of pericardial effusion. Mitral Valve: The mitral valve is normal in structure. Mild mitral valve regurgitation. No evidence of mitral valve stenosis. Tricuspid Valve: The tricuspid valve is normal in structure. Tricuspid valve regurgitation is mild . No evidence of tricuspid stenosis. Aortic Valve: The aortic valve is normal in structure. Aortic valve regurgitation is not visualized. No aortic stenosis is present. Pulmonic Valve: The pulmonic valve was normal in structure. Pulmonic valve regurgitation is not visualized. No evidence of pulmonic stenosis. Aorta: The aortic root is normal in size and structure. Venous: The inferior vena cava is normal in size with greater than 50% respiratory variability, suggesting right atrial pressure of 3 mmHg. IAS/Shunts: No atrial level shunt detected by color flow Doppler.  LEFT  VENTRICLE PLAX 2D LVIDd:         4.90 cm   Diastology LVIDs:         2.80 cm   LV e' medial:    8.05 cm/s LV PW:         0.70 cm   LV E/e' medial:  9.4 LV IVS:        1.10 cm   LV e' lateral:   14.50 cm/s LVOT diam:     1.90 cm   LV E/e' lateral: 5.2 LV SV:         72 LV SV Index:   37        2D Longitudinal Strain LVOT Area:     2.84 cm  2D Strain GLS Avg:     -20.7 %                           3D Volume EF:  3D EF:        59 %                          LV EDV:       129 ml                          LV ESV:       52 ml                          LV SV:        76 ml RIGHT VENTRICLE RV Basal diam:  2.60 cm RV S prime:     15.20 cm/s TAPSE (M-mode): 2.4 cm RVSP:           23.8 mmHg LEFT ATRIUM             Index        RIGHT ATRIUM           Index LA diam:        3.90 cm 1.98 cm/m   RA Pressure: 3.00 mmHg LA Vol (A2C):   43.6 ml 22.16 ml/m  RA Area:     12.40 cm LA Vol (A4C):   54.6 ml 27.75 ml/m  RA Volume:   25.80 ml  13.11 ml/m LA Biplane Vol: 50.8 ml 25.82 ml/m  AORTIC VALVE LVOT Vmax:   125.00 cm/s LVOT Vmean:  71.200 cm/s LVOT VTI:    0.255 m  AORTA Ao Root diam: 3.40 cm Ao Asc diam:  3.40 cm MITRAL VALVE               TRICUSPID VALVE MV Area (PHT): 3.99 cm    TR Peak grad:   20.8 mmHg MV Decel Time: 190 msec    TR Vmax:        228.00 cm/s MV E velocity: 75.50 cm/s  Estimated RAP:  3.00 mmHg MV A velocity: 79.70 cm/s  RVSP:           23.8 mmHg MV E/A ratio:  0.95                            SHUNTS                            Systemic VTI:  0.26 m                            Systemic Diam: 1.90 cm Candee Furbish MD Electronically signed by Candee Furbish MD Signature Date/Time: 09/21/2021/2:22:07 PM    Final

## 2021-10-18 NOTE — Assessment & Plan Note (Signed)
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

## 2021-10-18 NOTE — Assessment & Plan Note (Signed)
Her renal function is stable Observe closely 

## 2021-10-18 NOTE — Assessment & Plan Note (Signed)
We discussed goals of care I explained to the patient why surgery is not indicated in the treatment goal is lifelong

## 2021-10-18 NOTE — Assessment & Plan Note (Signed)
I have reviewed multiple imaging studies with the patient Overall, she have stable disease control She is developing multiple side effects from chemotherapy We discussed hormonal manipulation with antiestrogen approach Previously, she had mixed response with Lupron I recommend resumption of Lupron with additional aromatase inhibitor to control her disease She is interested to try We will get her scheduled to start treatment next month I recommend minimum 4 months of treatment before repeating imaging study around end of November

## 2021-10-31 ENCOUNTER — Inpatient Hospital Stay: Payer: 59

## 2021-10-31 ENCOUNTER — Encounter: Payer: Self-pay | Admitting: Internal Medicine

## 2021-11-01 ENCOUNTER — Other Ambulatory Visit: Payer: 59

## 2021-11-02 ENCOUNTER — Other Ambulatory Visit (INDEPENDENT_AMBULATORY_CARE_PROVIDER_SITE_OTHER): Payer: Self-pay | Admitting: Family Medicine

## 2021-11-02 ENCOUNTER — Other Ambulatory Visit: Payer: Self-pay

## 2021-11-02 ENCOUNTER — Inpatient Hospital Stay: Payer: 59 | Attending: Hematology

## 2021-11-02 ENCOUNTER — Inpatient Hospital Stay: Payer: 59

## 2021-11-02 ENCOUNTER — Other Ambulatory Visit (HOSPITAL_COMMUNITY): Payer: Self-pay

## 2021-11-02 ENCOUNTER — Other Ambulatory Visit: Payer: Self-pay | Admitting: Internal Medicine

## 2021-11-02 VITALS — BP 130/86 | HR 55 | Temp 97.6°F | Resp 18

## 2021-11-02 DIAGNOSIS — C786 Secondary malignant neoplasm of retroperitoneum and peritoneum: Secondary | ICD-10-CM | POA: Diagnosis not present

## 2021-11-02 DIAGNOSIS — E559 Vitamin D deficiency, unspecified: Secondary | ICD-10-CM

## 2021-11-02 DIAGNOSIS — N183 Chronic kidney disease, stage 3 unspecified: Secondary | ICD-10-CM | POA: Diagnosis not present

## 2021-11-02 DIAGNOSIS — Z5111 Encounter for antineoplastic chemotherapy: Secondary | ICD-10-CM | POA: Diagnosis not present

## 2021-11-02 DIAGNOSIS — I208 Other forms of angina pectoris: Secondary | ICD-10-CM | POA: Diagnosis not present

## 2021-11-02 DIAGNOSIS — C569 Malignant neoplasm of unspecified ovary: Secondary | ICD-10-CM | POA: Insufficient documentation

## 2021-11-02 DIAGNOSIS — R072 Precordial pain: Secondary | ICD-10-CM | POA: Diagnosis not present

## 2021-11-02 DIAGNOSIS — E785 Hyperlipidemia, unspecified: Secondary | ICD-10-CM | POA: Diagnosis not present

## 2021-11-02 DIAGNOSIS — I700 Atherosclerosis of aorta: Secondary | ICD-10-CM | POA: Diagnosis not present

## 2021-11-02 MED ORDER — LEUPROLIDE ACETATE 3.75 MG IM KIT
3.7500 mg | PACK | Freq: Once | INTRAMUSCULAR | Status: AC
  Administered 2021-11-02: 3.75 mg via INTRAMUSCULAR
  Filled 2021-11-02: qty 3.75

## 2021-11-05 LAB — HEPATIC FUNCTION PANEL
ALT: 18 IU/L (ref 0–32)
AST: 17 IU/L (ref 0–40)
Albumin: 4.4 g/dL (ref 3.9–4.9)
Alkaline Phosphatase: 100 IU/L (ref 44–121)
Bilirubin Total: 0.4 mg/dL (ref 0.0–1.2)
Bilirubin, Direct: 0.12 mg/dL (ref 0.00–0.40)
Total Protein: 6.7 g/dL (ref 6.0–8.5)

## 2021-11-05 LAB — LIPOPROTEIN A (LPA): Lipoprotein (a): 8.8 nmol/L (ref <75.0)

## 2021-11-06 ENCOUNTER — Other Ambulatory Visit (HOSPITAL_COMMUNITY): Payer: Self-pay

## 2021-11-12 ENCOUNTER — Ambulatory Visit (INDEPENDENT_AMBULATORY_CARE_PROVIDER_SITE_OTHER): Payer: 59 | Admitting: Family Medicine

## 2021-11-13 ENCOUNTER — Inpatient Hospital Stay: Payer: 59

## 2021-11-14 ENCOUNTER — Other Ambulatory Visit (HOSPITAL_COMMUNITY): Payer: Self-pay

## 2021-11-14 ENCOUNTER — Ambulatory Visit (INDEPENDENT_AMBULATORY_CARE_PROVIDER_SITE_OTHER): Payer: 59 | Admitting: Family Medicine

## 2021-11-14 ENCOUNTER — Encounter: Payer: Self-pay | Admitting: Internal Medicine

## 2021-11-14 ENCOUNTER — Encounter (INDEPENDENT_AMBULATORY_CARE_PROVIDER_SITE_OTHER): Payer: Self-pay | Admitting: Family Medicine

## 2021-11-14 VITALS — BP 136/80 | HR 58 | Temp 98.1°F | Ht 66.0 in | Wt 191.0 lb

## 2021-11-14 DIAGNOSIS — E669 Obesity, unspecified: Secondary | ICD-10-CM

## 2021-11-14 DIAGNOSIS — F3289 Other specified depressive episodes: Secondary | ICD-10-CM

## 2021-11-14 DIAGNOSIS — E559 Vitamin D deficiency, unspecified: Secondary | ICD-10-CM

## 2021-11-14 DIAGNOSIS — R7303 Prediabetes: Secondary | ICD-10-CM | POA: Diagnosis not present

## 2021-11-14 DIAGNOSIS — Z6830 Body mass index (BMI) 30.0-30.9, adult: Secondary | ICD-10-CM

## 2021-11-14 DIAGNOSIS — Z6833 Body mass index (BMI) 33.0-33.9, adult: Secondary | ICD-10-CM

## 2021-11-14 DIAGNOSIS — Z9189 Other specified personal risk factors, not elsewhere classified: Secondary | ICD-10-CM

## 2021-11-14 DIAGNOSIS — Z7984 Long term (current) use of oral hypoglycemic drugs: Secondary | ICD-10-CM

## 2021-11-14 MED ORDER — METFORMIN HCL 500 MG PO TABS
ORAL_TABLET | ORAL | 0 refills | Status: DC
  Filled 2021-11-14: qty 270, 90d supply, fill #0

## 2021-11-14 MED ORDER — VITAMIN D (ERGOCALCIFEROL) 1.25 MG (50000 UNIT) PO CAPS
50000.0000 [IU] | ORAL_CAPSULE | ORAL | 0 refills | Status: DC
  Filled 2021-11-14: qty 12, 84d supply, fill #0

## 2021-11-14 MED ORDER — BUPROPION HCL ER (SR) 200 MG PO TB12
200.0000 mg | ORAL_TABLET | Freq: Two times a day (BID) | ORAL | 0 refills | Status: DC
  Filled 2021-11-14: qty 180, 90d supply, fill #0

## 2021-11-22 NOTE — Progress Notes (Signed)
Chief Complaint:   OBESITY [name] is here to discuss her progress with her obesity treatment plan along with follow-up of her obesity related diagnoses. [name] is on keeping a food journal and adhering to recommended goals of 1200 calories and 80 grams protein and states she is following her eating plan approximately 80% of the time. [name] states she is swimming and doing cardio 60 minutes 1 times per week.  Today's visit was #: 72 Starting weight: 209 lbs Starting date: 04/29/2018 Today's weight: 191 lbs Today's date: 11/14/2021 Total lbs lost to date: 18 Total lbs lost since last in-office visit: 2  Interim History: Pt has decreased carb intake with snacks and crackers. She is moving for 60 minutes 2 days a week and trying to eat better and have better portion control. [place]. It has been almost 3 months since her last OV and pt did well.  Subjective:   1. Vitamin D deficiency She is currently taking prescription vitamin D 50,000 IU each week. She denies nausea, vomiting or muscle weakness.  2. Prediabetes [name] denies hunger or cravings and has no issues or concerns.  3. Other depression, with emotional eating Controlled. Medication: Wellbutrin.  4. At risk for activity intolerance [name] is at risk for exercise intolerance due to current physical limitations.  Assessment/Plan:  No orders of the defined types were placed in this encounter.   Medications Discontinued During This Encounter  Medication Reason   buPROPion (WELLBUTRIN SR) 200 MG 12 hr tablet Reorder   Vitamin D, Ergocalciferol, (DRISDOL) 1.25 MG (50000 UNIT) CAPS capsule Reorder   metFORMIN (GLUCOPHAGE) 500 MG tablet Reorder     Meds ordered this encounter  Medications   buPROPion (WELLBUTRIN SR) 200 MG 12 hr tablet    Sig: Take 1 tablet (200 mg total) by mouth 2 (two) times daily.    Dispense:  180 tablet    Refill:  0    90 d supply; ov for RF    metFORMIN (GLUCOPHAGE) 500 MG tablet    Sig: Take 1 tablet by mouth every morning and 2 tablets every evening as directed (need office visit for refills)    Dispense:  270 tablet    Refill:  0    90 d supply; ov for RF   Vitamin D, Ergocalciferol, (DRISDOL) 1.25 MG (50000 UNIT) CAPS capsule    Sig: Take 1 capsule (50,000 Units total) by mouth every 7 (seven) days. (Need Office Visit)    Dispense:  12 capsule    Refill:  0    No rf - needs OV     1. Vitamin D deficiency Low Vitamin D level contributes to fatigue and are associated with obesity, breast, and colon cancer. She agrees to continue to take prescription Vitamin D '@50'$ ,000 IU every week and will follow-up for routine testing of Vitamin D, at least 2-3 times per year to avoid over-replacement. Check labs at next OV.  Refill- Vitamin D, Ergocalciferol, (DRISDOL) 1.25 MG (50000 UNIT) CAPS capsule; Take 1 capsule (50,000 Units total) by mouth every 7 (seven) days. (Need Office Visit)  Dispense: 12 capsule; Refill: 0  2. Prediabetes [name] will continue to work on weight loss, exercise, and decreasing simple carbohydrates to help decrease the risk of diabetes. Check labs at next OV.  Refill- metFORMIN (GLUCOPHAGE) 500 MG tablet; Take 1 tablet by mouth every morning and 2 tablets every evening as directed (need office visit  for refills)  Dispense: 270 tablet; Refill: 0  3. Other depression, with emotional eating Behavior modification techniques were discussed today to help [name] deal with her emotional/non-hunger eating behaviors.  Orders and follow up as documented in patient record.   Refill- buPROPion (WELLBUTRIN SR) 200 MG 12 hr tablet; Take 1 tablet (200 mg total) by mouth 2 (two) times daily.  Dispense: 180 tablet; Refill: 0  4. At risk for activity intolerance [name] was given approximately 9 minutes of counseling today regarding her increased risk for exercise intolerance.  We discussed patient's specific personal and medical  issues that raise our concern.  She was advised of strategies to prevent injury and ways to improve her cardiopulmonary fitness levels slowly over time.  We additionally discussed various fitness trackers and smart phone apps to help motivate the patient to stay on track.   5. Obesity with current BMI of 30.8 [name] is currently in the action stage of change. As such, her goal is to continue with weight loss efforts. She has agreed to practicing portion control and making smarter food choices, such as increasing vegetables and decreasing simple carbohydrates.   Exercise goals:  As is  Behavioral modification strategies: increasing lean protein intake and planning for success.  [name] has agreed to follow-up with our clinic in 2.5 months. She was informed of the importance of frequent follow-up visits to maximize her success with intensive lifestyle modifications for her multiple health conditions.   Objective:   Blood pressure 136/80, pulse (!) 58, temperature 98.1 F (36.7 C), height '5\' 6"'$  (1.676 m), weight 191 lb (86.6 kg), SpO2 100 %. Body mass index is 30.83 kg/m.  General: Cooperative, alert, well developed, in no acute distress. HEENT: Conjunctivae and lids unremarkable. Cardiovascular: Regular rhythm.  Lungs: Normal work of breathing. Neurologic: No focal deficits.   Lab Results  Component Value Date   CREATININE 1.06 (H) 10/16/2021   BUN 22 10/16/2021   NA 135 10/16/2021   K 4.0 10/16/2021   CL 101 10/16/2021   CO2 27 10/16/2021   Lab Results  Component Value Date   ALT 18 11/02/2021   AST 17 11/02/2021   ALKPHOS 100 11/02/2021   BILITOT 0.4 11/02/2021   Lab Results  Component Value Date   HGBA1C 5.4 07/09/2021   HGBA1C 5.2 11/30/2019   HGBA1C 5.3 05/31/2019   HGBA1C 5.2 11/19/2018   HGBA1C 5.5 04/29/2018   Lab Results  Component Value Date   INSULIN 17.1 07/09/2021   INSULIN 23.8 11/30/2019   INSULIN 16.5 05/31/2019   INSULIN 17.6 11/19/2018   INSULIN 25.8  (H) 04/29/2018   Lab Results  Component Value Date   TSH 0.679 07/09/2021   Lab Results  Component Value Date   CHOL 255 (H) 07/09/2021   HDL 50 07/09/2021   LDLCALC 182 (H) 07/09/2021   TRIG 126 07/09/2021   Lab Results  Component Value Date   VD25OH 38.6 07/09/2021   VD25OH 59.0 11/30/2019   VD25OH 54.0 05/31/2019   Lab Results  Component Value Date   WBC 5.6 10/16/2021   HGB 12.0 10/16/2021   HCT 35.5 (L) 10/16/2021   MCV 92.0 10/16/2021   PLT 309 10/16/2021    Attestation Statements:   Reviewed by clinician on day of visit: allergies, medications, problem list, medical history, surgical history, family history, social history, and previous encounter notes.  I, Kathlene November, BS, CMA, am acting as transcriptionist for [employer], DO.  I have reviewed the above documentation for accuracy  and completeness, and I agree with the above. Marjory Sneddon, D.O.  The [place] was signed into law in 2016 which includes the topic of electronic health records.  This provides immediate access to information in MyChart.  This includes consultation notes, operative notes, office notes, lab results and pathology reports.  If you have any questions about what you read please let us know at your next visit so we can discuss your concerns and take corrective action if need be.  We are right here with you.

## 2021-11-23 ENCOUNTER — Other Ambulatory Visit: Payer: Self-pay | Admitting: *Deleted

## 2021-11-23 ENCOUNTER — Telehealth: Payer: Self-pay

## 2021-11-23 ENCOUNTER — Inpatient Hospital Stay: Payer: 59 | Attending: Hematology

## 2021-11-23 ENCOUNTER — Encounter: Payer: Self-pay | Admitting: Internal Medicine

## 2021-11-23 DIAGNOSIS — E785 Hyperlipidemia, unspecified: Secondary | ICD-10-CM

## 2021-11-23 DIAGNOSIS — N183 Chronic kidney disease, stage 3 unspecified: Secondary | ICD-10-CM | POA: Diagnosis not present

## 2021-11-23 DIAGNOSIS — C786 Secondary malignant neoplasm of retroperitoneum and peritoneum: Secondary | ICD-10-CM | POA: Diagnosis not present

## 2021-11-23 DIAGNOSIS — Z79811 Long term (current) use of aromatase inhibitors: Secondary | ICD-10-CM | POA: Diagnosis not present

## 2021-11-23 DIAGNOSIS — Z5111 Encounter for antineoplastic chemotherapy: Secondary | ICD-10-CM | POA: Insufficient documentation

## 2021-11-23 DIAGNOSIS — C569 Malignant neoplasm of unspecified ovary: Secondary | ICD-10-CM | POA: Diagnosis not present

## 2021-11-23 LAB — CBC WITH DIFFERENTIAL/PLATELET
Abs Immature Granulocytes: 0.01 10*3/uL (ref 0.00–0.07)
Basophils Absolute: 0 10*3/uL (ref 0.0–0.1)
Basophils Relative: 1 %
Eosinophils Absolute: 0.2 10*3/uL (ref 0.0–0.5)
Eosinophils Relative: 4 %
HCT: 37.5 % (ref 36.0–46.0)
Hemoglobin: 12.6 g/dL (ref 12.0–15.0)
Immature Granulocytes: 0 %
Lymphocytes Relative: 24 %
Lymphs Abs: 1.4 10*3/uL (ref 0.7–4.0)
MCH: 30.2 pg (ref 26.0–34.0)
MCHC: 33.6 g/dL (ref 30.0–36.0)
MCV: 89.9 fL (ref 80.0–100.0)
Monocytes Absolute: 0.6 10*3/uL (ref 0.1–1.0)
Monocytes Relative: 9 %
Neutro Abs: 3.7 10*3/uL (ref 1.7–7.7)
Neutrophils Relative %: 62 %
Platelets: 287 10*3/uL (ref 150–400)
RBC: 4.17 MIL/uL (ref 3.87–5.11)
RDW: 13.2 % (ref 11.5–15.5)
WBC: 5.9 10*3/uL (ref 4.0–10.5)
nRBC: 0 % (ref 0.0–0.2)

## 2021-11-23 LAB — COMPREHENSIVE METABOLIC PANEL
ALT: 14 U/L (ref 0–44)
AST: 15 U/L (ref 15–41)
Albumin: 4 g/dL (ref 3.5–5.0)
Alkaline Phosphatase: 82 U/L (ref 38–126)
Anion gap: 5 (ref 5–15)
BUN: 27 mg/dL — ABNORMAL HIGH (ref 8–23)
CO2: 29 mmol/L (ref 22–32)
Calcium: 8.9 mg/dL (ref 8.9–10.3)
Chloride: 106 mmol/L (ref 98–111)
Creatinine, Ser: 1.15 mg/dL — ABNORMAL HIGH (ref 0.44–1.00)
GFR, Estimated: 53 mL/min — ABNORMAL LOW (ref >60)
Glucose, Bld: 105 mg/dL — ABNORMAL HIGH (ref 70–99)
Potassium: 4.3 mmol/L (ref 3.5–5.1)
Sodium: 140 mmol/L (ref 135–145)
Total Bilirubin: 0.4 mg/dL (ref 0.3–1.2)
Total Protein: 6.8 g/dL (ref 6.5–8.1)

## 2021-11-23 NOTE — Telephone Encounter (Signed)
Dr Alvy Bimler,  Message from pt- had labs drawn on 8/4 and she would like to know if you are comfortable using those for her appointment on 8/10.  If so, she wants [name] to cancel her lab appt.    Thanks, Estate agent

## 2021-11-23 NOTE — Progress Notes (Signed)
New order for lipids placed.  Pt having drawn at [facility] today.

## 2021-11-26 ENCOUNTER — Encounter: Payer: Self-pay | Admitting: Internal Medicine

## 2021-11-26 NOTE — Telephone Encounter (Signed)
I canceled the lab appt

## 2021-11-28 ENCOUNTER — Encounter (INDEPENDENT_AMBULATORY_CARE_PROVIDER_SITE_OTHER): Payer: Self-pay

## 2021-11-28 ENCOUNTER — Encounter: Payer: Self-pay | Admitting: Internal Medicine

## 2021-11-29 ENCOUNTER — Encounter: Payer: Self-pay | Admitting: Hematology and Oncology

## 2021-11-29 ENCOUNTER — Inpatient Hospital Stay: Payer: 59

## 2021-11-29 ENCOUNTER — Inpatient Hospital Stay (HOSPITAL_BASED_OUTPATIENT_CLINIC_OR_DEPARTMENT_OTHER): Payer: 59 | Admitting: Hematology and Oncology

## 2021-11-29 DIAGNOSIS — Z5111 Encounter for antineoplastic chemotherapy: Secondary | ICD-10-CM | POA: Diagnosis not present

## 2021-11-29 DIAGNOSIS — N183 Chronic kidney disease, stage 3 unspecified: Secondary | ICD-10-CM | POA: Diagnosis not present

## 2021-11-29 DIAGNOSIS — Z79811 Long term (current) use of aromatase inhibitors: Secondary | ICD-10-CM | POA: Diagnosis not present

## 2021-11-29 DIAGNOSIS — C569 Malignant neoplasm of unspecified ovary: Secondary | ICD-10-CM | POA: Diagnosis not present

## 2021-11-29 DIAGNOSIS — C786 Secondary malignant neoplasm of retroperitoneum and peritoneum: Secondary | ICD-10-CM | POA: Diagnosis not present

## 2021-11-29 MED ORDER — LEUPROLIDE ACETATE 3.75 MG IM KIT
3.7500 mg | PACK | Freq: Once | INTRAMUSCULAR | Status: AC
  Administered 2021-11-29: 3.75 mg via INTRAMUSCULAR
  Filled 2021-11-29: qty 3.75

## 2021-11-29 NOTE — Assessment & Plan Note (Signed)
Her last imaging studies showed stable disease control Since discontinuation of chemotherapy, she had no nausea and her quality of life has improved We discussed hormonal manipulation with antiestrogen approach So far, she denies hot flashes, mood swings or depression I recommend combination of aromatase inhibitor and Lupron monthly for the next 3 months I recommend minimum 3- 4 months of treatment before repeating imaging study around end of November

## 2021-11-29 NOTE — Progress Notes (Signed)
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[facility]) Her last imaging studies showed stable disease control Since discontinuation of chemotherapy, she had no nausea and her quality of life has improved We discussed hormonal manipulation with antiestrogen approach So far, she denies hot flashes, mood swings or depression I recommend combination of aromatase inhibitor and Lupron monthly for the next 3 months I recommend minimum 3- 4 months of treatment before repeating imaging study around end of November  CKD (chronic kidney disease), stage III Her renal function is stable Observe closely  No orders of the defined types were placed in this encounter.   All questions were answered. The patient knows to call the clinic with any problems, questions or concerns. The total time spent in the appointment was 20 minutes encounter with patients including review of chart and various tests results, discussions about plan of care and coordination of care plan   Heath Lark, MD 11/29/2021 8:54 AM  INTERVAL HISTORY: Please see below for problem oriented charting. she returns for treatment follow-up on Lupron and aromatase inhibitor for granulosa cell tumor of the ovary She is doing well Denies peripheral neuropathy No abdominal pain or changes in bowel habits Denies significant hot flashes, mood swings or depression  REVIEW OF SYSTEMS:   Constitutional: Denies fevers, chills or abnormal weight loss Eyes: Denies blurriness of vision Ears, nose, mouth, throat, and face: Denies mucositis or sore throat Respiratory: Denies cough, dyspnea or wheezes Cardiovascular: Denies palpitation, chest discomfort or lower extremity swelling Gastrointestinal:  Denies nausea, heartburn or change in bowel habits Skin:  Denies abnormal skin rashes Lymphatics: Denies new lymphadenopathy or easy bruising Neurological:Denies numbness, tingling or new weaknesses Behavioral/Psych: Mood is stable, no new changes  All other systems were reviewed with the patient and are negative.  I have reviewed the past medical history, past surgical history, social history and family history with the patient and they are unchanged from previous note.  ALLERGIES:  is allergic to codeine, thimerosal, ciprofloxacin, daypro [oxaprozin], levofloxacin, stadol [butorphanol tartrate], and sulfa antibiotics.  MEDICATIONS:  Current Outpatient Medications  Medication Sig Dispense Refill   acetaminophen (TYLENOL) 500 MG tablet Take 1,000 mg by mouth every 6 (six) hours as needed for moderate pain.     anastrozole (ARIMIDEX) 1 MG tablet Take 1 tablet (1 mg total) by mouth daily. 30 tablet 3   aspirin EC 81 MG tablet Take 81 mg by mouth daily. Swallow whole.     atorvastatin (LIPITOR) 40 MG tablet Take 1 tablet (40 mg total) by mouth daily. 90 tablet 3   Biotin 5 MG TABS Take 5 mg by mouth daily.     buPROPion (WELLBUTRIN SR) 200 MG 12 hr tablet Take 1 tablet (200 mg total) by mouth 2 (two) times daily. 180 tablet 0   Ca Phosphate-Cholecalciferol (EQL CALCIUM GUMMIES) 250-400 MG-UNIT CHEW Chew 1 tablet by mouth 2 (two) times daily.     escitalopram (LEXAPRO) 20 MG tablet Take 1 tablet (20 mg total) by mouth daily. 90 tablet 3   gabapentin (NEURONTIN) 300 MG capsule Take 1 capsule (300 mg total) by mouth 2 (two) times daily. 60 capsule 3   metFORMIN (GLUCOPHAGE) 500 MG tablet Take 1 tablet by mouth every morning and 2 tablets every evening as directed (need office visit for refills) 270 tablet  0   mirabegron ER (MYRBETRIQ) 50 MG TB24 tablet Take 1 tablet by mouth once daily 90 tablet 3   nitroGLYCERIN (NITROSTAT) 0.4 MG SL tablet Place 1 tablet (0.4 mg total) under the tongue every 5 (five) minutes as needed for chest pain. 25 tablet 3    omeprazole (PRILOSEC) 20 MG capsule Take 1 capsule by mouth once a day 90 capsule 3   ondansetron (ZOFRAN) 8 MG tablet Take 1 tablet (8 mg total) by mouth every 8 (eight) hours as needed for nausea or vomiting. 60 tablet 0   Polyethyl Glycol-Propyl Glycol (SYSTANE OP) Place 1 drop into both eyes daily as needed (dry eyes).     Prenatal MV & Min w/FA-DHA (PRENATAL GUMMIES) 0.18-25 MG CHEW Chew 1 capsule by mouth daily.     Probiotic Product (ALIGN PO) Take 1 tablet by mouth daily.      prochlorperazine (COMPAZINE) 10 MG tablet Take 1 tablet by mouth every 6 hours as needed for nausea or vomiting. 30 tablet 1   Vitamin D, Ergocalciferol, (DRISDOL) 1.25 MG (50000 UNIT) CAPS capsule Take 1 capsule (50,000 Units total) by mouth every 7 (seven) days. (Need Office Visit) 12 capsule 0   No current facility-administered medications for this visit.    SUMMARY OF ONCOLOGIC HISTORY: Oncology History Overview Note  2018 - Core biopsy for ER/PR and Foundation One testing performed. Unfortunately, no sufficient tissue for Foundation One testing. ER was 50% and PR 90%. Progressed on carboplatin, exemestane, Avastin,Tamoxifen/Megace and letrozole, mixed response on Lupron  AMH: 06/23/19: 108 05/26/19: 9.04 01/05/19: 6.84 09/02/18: 3.72 06/01/18: 3.84 02/24/18: 2.6 11/21/17: 3.26 08/26/17: 1.77 05/27/17: 2.12 01/28/17: 2.47 06/10/16: 2.68 02/06/16: 1.84 05/12/15: 3.27 10/03/14: 2.12  Inhibin B 09/22/2019: 95.9 05/26/19: 90.2 01/05/19: 92 09/02/18: 70.9 06/04/18: 70 02/24/18: 79.5 11/21/17: 85.8 08/26/17: 65.6 05/27/17: 58.9 01/28/17: 198 06/10/16: 118.1 02/06/16: 62.4 05/12/15: 64.6 10/03/14: 58   Malignant granulosa cell tumor of ovary ([place])  1994 Initial Diagnosis   1994   2002 Relapse/Recurrence   Upper abdominal recurrence, resected.     - 09/2000 Chemotherapy   6 cycles of IP cisplatin and etoposide    02/2009 Relapse/Recurrence   CT showed increased size of nodules in pelvis   2010 Surgery   Exlap with  section of tumor nodules near cecum and left pelvic sidewall. Tumor: ER negative, PR positive    Treatment Plan Change   Alternated 2 week courses of Megace and Tamoxifen - ended 03/2012   03/2012 PET scan   CT - progressive disease   2013 Treatment Plan Change   Letrozole   01/2016 Imaging   MRI showed progressive disease   2017 Treatment Plan Change   Two weeks of alternating Tamoxifen 68m daily and then Megace 420mTID   08/2016 Imaging   MRI showed progressive disease with peritoneal implants near liver, in pelvis   01/2017 Treatment Plan Change   Lupron 11.25 q 3 months   11/2017 Imaging   Overall mixed response.   Mixed cystic/solid lesions in the left pelvis are mildly improved.   Cystic peritoneal disease, including the dominant lesion along the posterior right hepatic lobe, is mildly progressed.   Subcapsular lesion along the posterior right hepatic lobe is unchanged.   03/2018 Imaging   MRI A/P: Mixed response of individual peritoneal metastases in the pelvis, as described above. Overall, there has been no significant change in bulk of disease.   Stable cystic peritoneal metastatic disease along the capsular surfaces of the liver and  spleen.   No new sites of metastatic disease identified within the abdomen or pelvis.   08/2018 Imaging   MRI A/P: Status post hysterectomy and bilateral salpingo-oophorectomy.   Mixed cystic/solid peritoneal implants in the abdomen/pelvis, as above. Dominant cystic implant along the posterior liver surface is mildly increased. Remaining lesions are overall grossly unchanged.   No new lesions are identified.   01/28/2019 Imaging   Mri A/P: 1. Relatively similar appearance of peritoneal metastasis. A posterior right hepatic capsular based lesion is similar to minimally decreased in size. Left pelvic implants are primarily similar with possible enlargement of an anterior high left pelvic cystic implant. No new disease  identified. 2.  Aortic Atherosclerosis (ICD10-I70.0). 3. Hepatic steatosis. 4. Left adrenal adenoma.   06/02/2019 Imaging   MRI 1. Potential slight enlargement of dominant cystic area and solid component, associated with rind like signal variation on T2 along the inferior right hepatic margin, also potentially slightly increased. Findings may still be within the realm of measurement and technical variation. Close attention on follow-up. 2. Subtle cystic changes along the cephalad margin of the spleen are difficult to see on previous imaging, perhaps new compared with prior imaging studies. 3. Pelvic implants and left lower quadrant lesion with similar size, of the area along the left iliac vasculature may be slightly larger than on the prior study. 4. Signs of extensive retroperitoneal and pelvic lymphadenectomy. 5. Hepatic steatosis. 6. 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     06/08/2019 Cancer Staging   Staging form: [place], AJCC 7th Edition - Clinical: Stage IIIC (rT2, N1, M0) - Signed by Heath Lark, MD on 06/08/2019   06/10/2019 Imaging   1. 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 These findings are slightly worsened in comparison to a remote prior CT examination dated 10/04/2014.   2.  No evidence of metastatic disease in the chest.   3. Status post hysterectomy, pelvic and retroperitoneal lymph node dissection, and ventral hernia mesh repair.   4.  Hepatic steatosis.   5.  Aortic Atherosclerosis (ICD10-I70.0).   06/24/2019 - 09/02/2019 Chemotherapy   The patient had carboplatin for chemotherapy treatment.     09/02/2019 Tumor Marker   Patient's tumor was tested for the following markers: Inhibin B Results of the tumor marker test revealed 94    09/23/2019 Imaging   1. Interval progression of the soft tissue lesions in the anterior left pelvis, likely peritoneal implants, compatible with disease progression. Remaining sites of apparent disease along the liver capsule and posterior spleen are stable 2. Stable 2 cm left adrenal adenoma. 3. Hepatic steatosis. 4. Right-side predominant colonic diverticulosis without diverticulitis. 5. Aortic Atherosclerosis (ICD10-I70.0).   12/23/2019 Imaging   1. Slight interval increase in size of a mixed solid and cystic nodule in the left hemipelvis measuring 3.0 x 2.9 cm, previously 2.7 x 2.1 cm. 2. Interval decrease in size of a nodule in the left paracolic gutter measuring 1.0 x 0.8 cm, previously 2.1 x 1.6 cm. 3. Stable peritoneal nodules in the hepatorenal recess and at the inferior tip of the spleen. 4. Unchanged nodule or lymph node overlying the left external iliac artery. 5. Enlargement of dominant left pelvic nodule is concerning for disease progression despite interval decrease in size of a nodule in the left paracolic gutter  and stability of other nodules. 6. No evidence of metastatic disease in the chest. 7. Stable, benign left adrenal adenoma. 8. Ventral hernia mesh repair with a small component of recurrent hernia inferiorly, containing a single nonobstructed loop of small bowel. 9. Hepatic steatosis. 10. Aortic Atherosclerosis (ICD10-I70.0).   12/23/2019 Tumor Marker   Patient's tumor was tested for the following markers: Inhibin B Results of the tumor marker test revealed 94.9   01/25/2020 Tumor Marker   Patient's tumor was tested for the following markers: Inhibin B Results of the tumor marker test revealed 116.7   02/20/2020 Genetic Testing   Negative genetic testing: no pathogenic variants detected in [facility] Multi-Cancer Panel.  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  The report date is February 20, 2020.   The Multi-Cancer Panel offered by [facility]  includes sequencing and/or deletion duplication testing of the following 85 genes: AIP, ALK, APC, ATM, AXIN2,BAP1,  BARD1, BLM, BMPR1A, BRCA1, BRCA2, BRIP1, CASR, CDC73, CDH1, CDK4, CDKN1B, CDKN1C, CDKN2A (p14ARF), CDKN2A (p16INK4a), CEBPA, CHEK2, CTNNA1, DICER1, DIS3L2, EGFR (c.2369C>T, p.Thr790Met variant only), EPCAM (Deletion/duplication testing only), FH, FLCN, GATA2, GPC3, GREM1 (Promoter region deletion/duplication testing only), HOXB13 (c.251G>A, p.Gly84Glu), HRAS, KIT, MAX, MEN1, MET, MITF (c.952G>A, p.Glu318Lys variant only), MLH1, MSH2, MSH3, MSH6, MUTYH, NBN, NF1, NF2, NTHL1, PALB2, PDGFRA, PHOX2B, PMS2, POLD1, POLE, POT1, PRKAR1A, PTCH1, PTEN, RAD50, RAD51C, RAD51D, RB1, RECQL4, RET, RNF43, RUNX1, SDHAF2, SDHA (sequence changes only), SDHB, SDHC, SDHD, SMAD4, SMARCA4, SMARCB1, SMARCE1, STK11, SUFU, TERC, TERT, TMEM127, TP53, TSC1, TSC2, VHL, WRN and WT1.    03/02/2020 Tumor Marker   Patient's tumor was tested for the following markers: Inhibin B Results of the tumor marker test revealed 80.8   04/17/2020 Imaging   1. Overall, exam is stable. Multiple peritoneal nodules are again seen. The index nodule in the left lower quadrant is mildly increased in size in the interval. The lesion within the anterior left pelvis has decreased in size in the interval. There has also been decrease in size of cystic lesion within the a hepatorenal recess. The remaining peritoneal lesions are unchanged. No new lesions identified. 2. Stable left adrenal nodule. 3.  Aortic Atherosclerosis (ICD10-I70.0).     08/21/2020 Imaging   Bone density is normal AP spine T score 0.8 Femoral neck on the left, T score -0.2 Femoral neck on the right ,T score -0.4   10/17/2020 Imaging   1. Interval progression of peritoneal nodules along the left pelvic sidewall, concerning for progressive metastatic disease. 2. Small capsular lesion medial right liver and the apparent cystic lesion superior to the right kidney are similar  to prior. 3. Tiny soft tissue nodule anterior aspect of the lateral left pelvis described as decreasing on the prior study has decreased further on today's exam. 4. Stable left adrenal nodule. This cannot be definitively characterized. 5. Tiny nonobstructing stone lower pole right kidney. 6. Aortic Atherosclerosis (ICD10-I70.0).   10/26/2020 Procedure   Successful placement of a right internal jugular approach power injectable Port-A-Cath. The catheter is ready for immediate use.       10/31/2020 - 10/04/2021 Chemotherapy   Patient is on Treatment Plan : OVARIAN Paclitaxel B3,3,83,29 q28d     01/25/2021 Imaging   1. Signs of peritoneal disease with scattered peritoneal nodules. The index lesions are either stable or decreased in size in the interval as detailed above. No new sites of disease. 2. Ventral pelvic wall hernia contains a nonobstructed loop of small bowel. 3. Aortic Atherosclerosis (ICD10-I70.0).  06/14/2021 Imaging   1. Multiple peritoneal metastatic nodules are slightly diminished in size. 2. A left external iliac lymph node or nodule is however stable to slightly enlarged. 3. Overall findings are most consistent with continued treatment response of metastatic disease. 4. No evidence of metastatic disease within the chest. 5. Hepatic steatosis.     06/14/2021 Imaging   Right: Findings consistent with 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   Left: No evidence of thrombosis in the subclavian.   06/20/2021 Procedure   Successful right IJ vein Port-A-Cath explant.     10/18/2021 Imaging   1. Similar to mild interval increase in size of left external iliac lymph node. 2. Multiple peritoneal metastatic nodules are grossly unchanged in size when compared to prior exam     PHYSICAL EXAMINATION: ECOG PERFORMANCE STATUS: 0 - Asymptomatic  Vitals:   11/29/21 0828  BP: (!) 143/68  Pulse: (!) 53  Resp:  18  SpO2: 100%   Filed Weights   11/29/21 0828  Weight: 199 lb (90.3 kg)    GENERAL:alert, no distress and comfortable NEURO: alert & oriented x 3 with fluent speech, no focal motor/sensory deficits  LABORATORY DATA:  I have reviewed the data as listed    Component Value Date/Time   NA 140 11/23/2021 0831   NA 140 07/09/2021 0813   NA 141 09/24/2013 1131   K 4.3 11/23/2021 0831   K 4.5 09/24/2013 1131   CL 106 11/23/2021 0831   CO2 29 11/23/2021 0831   CO2 24 09/24/2013 1131   GLUCOSE 105 (H) 11/23/2021 0831   GLUCOSE 90 09/24/2013 1131   BUN 27 (H) 11/23/2021 0831   BUN 20 07/09/2021 0813   BUN 21.2 09/24/2013 1131   CREATININE 1.15 (H) 11/23/2021 0831   CREATININE 1.19 (H) 06/20/2021 1513   CREATININE 1.1 09/24/2013 1131   CALCIUM 8.9 11/23/2021 0831   CALCIUM 9.5 09/24/2013 1131   PROT 6.8 11/23/2021 0831   PROT 6.7 11/02/2021 0000   PROT 6.8 09/24/2013 1131   ALBUMIN 4.0 11/23/2021 0831   ALBUMIN 4.4 11/02/2021 0000   ALBUMIN 3.9 09/24/2013 1131   AST 15 11/23/2021 0831   AST 21 06/20/2021 1513   AST 23 09/24/2013 1131   ALT 14 11/23/2021 0831   ALT 19 06/20/2021 1513   ALT 27 09/24/2013 1131   ALKPHOS 82 11/23/2021 0831   ALKPHOS 81 09/24/2013 1131   BILITOT 0.4 11/23/2021 0831   BILITOT 0.4 11/02/2021 0000   BILITOT 0.4 06/20/2021 1513   BILITOT 0.53 09/24/2013 1131   GFRNONAA 53 (L) 11/23/2021 0831   GFRNONAA 51 (L) 06/20/2021 1513   GFRAA 55 (L) 01/24/2020 1530   GFRAA 57 (L) 12/21/2019 1259    No results found for: "SPEP", "UPEP"  Lab Results  Component Value Date   WBC 5.9 11/23/2021   NEUTROABS 3.7 11/23/2021   HGB 12.6 11/23/2021   HCT 37.5 11/23/2021   MCV 89.9 11/23/2021   PLT 287 11/23/2021      Chemistry      Component Value Date/Time   NA 140 11/23/2021 0831   NA 140 07/09/2021 0813   NA 141 09/24/2013 1131   K 4.3 11/23/2021 0831   K 4.5 09/24/2013 1131   CL 106 11/23/2021 0831   CO2 29 11/23/2021 0831   CO2 24  09/24/2013 1131   BUN 27 (H) 11/23/2021 0831   BUN 20 07/09/2021 0813   BUN 21.2 09/24/2013  1131   CREATININE 1.15 (H) 11/23/2021 0831   CREATININE 1.19 (H) 06/20/2021 1513   CREATININE 1.1 09/24/2013 1131      Component Value Date/Time   CALCIUM 8.9 11/23/2021 0831   CALCIUM 9.5 09/24/2013 1131   ALKPHOS 82 11/23/2021 0831   ALKPHOS 81 09/24/2013 1131   AST 15 11/23/2021 0831   AST 21 06/20/2021 1513   AST 23 09/24/2013 1131   ALT 14 11/23/2021 0831   ALT 19 06/20/2021 1513   ALT 27 09/24/2013 1131   BILITOT 0.4 11/23/2021 0831   BILITOT 0.4 11/02/2021 0000   BILITOT 0.4 06/20/2021 1513   BILITOT 0.53 09/24/2013 1131

## 2021-11-29 NOTE — Assessment & Plan Note (Signed)
Her renal function is stable Observe closely 

## 2021-12-03 ENCOUNTER — Other Ambulatory Visit (HOSPITAL_COMMUNITY): Payer: Self-pay

## 2021-12-11 DIAGNOSIS — G4733 Obstructive sleep apnea (adult) (pediatric): Secondary | ICD-10-CM | POA: Diagnosis not present

## 2021-12-14 DIAGNOSIS — M791 Myalgia, unspecified site: Secondary | ICD-10-CM | POA: Diagnosis not present

## 2021-12-14 DIAGNOSIS — R051 Acute cough: Secondary | ICD-10-CM | POA: Diagnosis not present

## 2021-12-14 DIAGNOSIS — R6883 Chills (without fever): Secondary | ICD-10-CM | POA: Diagnosis not present

## 2021-12-25 ENCOUNTER — Inpatient Hospital Stay: Payer: 59 | Attending: Hematology

## 2021-12-25 DIAGNOSIS — N951 Menopausal and female climacteric states: Secondary | ICD-10-CM | POA: Insufficient documentation

## 2021-12-25 DIAGNOSIS — Z79811 Long term (current) use of aromatase inhibitors: Secondary | ICD-10-CM | POA: Insufficient documentation

## 2021-12-25 DIAGNOSIS — G620 Drug-induced polyneuropathy: Secondary | ICD-10-CM | POA: Insufficient documentation

## 2021-12-25 DIAGNOSIS — C786 Secondary malignant neoplasm of retroperitoneum and peritoneum: Secondary | ICD-10-CM | POA: Insufficient documentation

## 2021-12-25 DIAGNOSIS — N183 Chronic kidney disease, stage 3 unspecified: Secondary | ICD-10-CM | POA: Insufficient documentation

## 2021-12-25 DIAGNOSIS — C569 Malignant neoplasm of unspecified ovary: Secondary | ICD-10-CM | POA: Insufficient documentation

## 2021-12-25 DIAGNOSIS — D3502 Benign neoplasm of left adrenal gland: Secondary | ICD-10-CM | POA: Insufficient documentation

## 2021-12-27 ENCOUNTER — Inpatient Hospital Stay: Payer: 59

## 2021-12-27 ENCOUNTER — Encounter: Payer: Self-pay | Admitting: Hematology and Oncology

## 2021-12-27 ENCOUNTER — Inpatient Hospital Stay (HOSPITAL_BASED_OUTPATIENT_CLINIC_OR_DEPARTMENT_OTHER): Payer: 59 | Admitting: Hematology and Oncology

## 2021-12-27 ENCOUNTER — Other Ambulatory Visit (HOSPITAL_COMMUNITY): Payer: Self-pay

## 2021-12-27 VITALS — BP 126/60 | HR 75 | Temp 97.8°F | Resp 18 | Ht 66.0 in | Wt 189.3 lb

## 2021-12-27 DIAGNOSIS — N951 Menopausal and female climacteric states: Secondary | ICD-10-CM | POA: Diagnosis not present

## 2021-12-27 DIAGNOSIS — C569 Malignant neoplasm of unspecified ovary: Secondary | ICD-10-CM | POA: Diagnosis not present

## 2021-12-27 DIAGNOSIS — Z79811 Long term (current) use of aromatase inhibitors: Secondary | ICD-10-CM | POA: Diagnosis not present

## 2021-12-27 DIAGNOSIS — G620 Drug-induced polyneuropathy: Secondary | ICD-10-CM

## 2021-12-27 DIAGNOSIS — D3502 Benign neoplasm of left adrenal gland: Secondary | ICD-10-CM | POA: Diagnosis not present

## 2021-12-27 DIAGNOSIS — N183 Chronic kidney disease, stage 3 unspecified: Secondary | ICD-10-CM

## 2021-12-27 DIAGNOSIS — R232 Flushing: Secondary | ICD-10-CM

## 2021-12-27 DIAGNOSIS — C786 Secondary malignant neoplasm of retroperitoneum and peritoneum: Secondary | ICD-10-CM | POA: Diagnosis not present

## 2021-12-27 DIAGNOSIS — T451X5A Adverse effect of antineoplastic and immunosuppressive drugs, initial encounter: Secondary | ICD-10-CM | POA: Diagnosis not present

## 2021-12-27 MED ORDER — ANASTROZOLE 1 MG PO TABS
1.0000 mg | ORAL_TABLET | Freq: Every day | ORAL | 3 refills | Status: DC
Start: 2021-12-27 — End: 2022-03-26
  Filled 2021-12-27: qty 90, 90d supply, fill #0

## 2021-12-27 MED ORDER — LEUPROLIDE ACETATE 3.75 MG IM KIT
3.7500 mg | PACK | Freq: Once | INTRAMUSCULAR | Status: AC
  Administered 2021-12-27: 3.75 mg via INTRAMUSCULAR
  Filled 2021-12-27: qty 3.75

## 2021-12-27 NOTE — Assessment & Plan Note (Signed)
She has slight worsening hot flashes I recommend the patient to take gabapentin consistently We can increase the dose in the future if needed

## 2021-12-27 NOTE — Assessment & Plan Note (Signed)
Her renal function is stable Observe closely 

## 2021-12-27 NOTE — Assessment & Plan Note (Signed)
I have reviewed plan of care with the patient extensively So far, she tolerated a combination of Lupron plus Arimidex well except for slightly worsening hot flashes She have no abdominal symptoms such as nausea, pain or changes in bowel habits I plan to repeat imaging study end of next month for further follow-up She will continue Arimidex daily and Lupron monthly

## 2021-12-27 NOTE — Progress Notes (Signed)
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[place]) I have reviewed plan of care with the patient extensively So far, she tolerated a combination of Lupron plus Arimidex well except for slightly worsening hot flashes She have no abdominal symptoms such as nausea, pain or changes in bowel habits I plan to repeat imaging study end of next month for further follow-up She will continue Arimidex daily and Lupron monthly  CKD (chronic kidney disease), stage III Her renal function is stable Observe closely  Hot flashes She has slight worsening hot flashes I recommend the patient to take gabapentin consistently We can increase the dose in the future if needed  Peripheral neuropathy due to chemotherapy [facility]) She has mild persistent peripheral neuropathy but not taking gabapentin consistently I recommend the patient to take it consistently to treat this and hot flashes  Orders Placed This Encounter  Procedures   CT ABDOMEN PELVIS W CONTRAST    Standing Status:   Future    Standing Expiration Date:   12/28/2022    Order Specific Question:   If indicated for the ordered procedure, I authorize the administration of contrast media per Radiology protocol    Answer:   Yes    Order Specific Question:   Preferred imaging location?    Answer:   [facility]    Order Specific Question:   Radiology Contrast Protocol - do NOT remove file path    Answer:   [email or web address]    All questions were answered. The patient knows to call the clinic with any problems, questions or concerns. The total time spent in the appointment was 30 minutes encounter with patients including review of chart and various tests results, discussions about plan of care and coordination of  care plan   Heath Lark, MD 12/27/2021 2:11 PM  INTERVAL HISTORY: Please see below for problem oriented charting. she returns for treatment follow-up on treatment with Arimidex and Lupron She has noticed slightly worsening hot flashes She has mild persistent peripheral neuropathy She has very mild left upper quadrant discomfort but not consistent No recent changes in bowel habits She has not been taking gabapentin consistently  REVIEW OF SYSTEMS:   Constitutional: Denies fevers, chills or abnormal weight loss Eyes: Denies blurriness of vision Ears, nose, mouth, throat, and face: Denies mucositis or sore throat Respiratory: Denies cough, dyspnea or wheezes Cardiovascular: Denies palpitation, chest discomfort or lower extremity swelling Gastrointestinal:  Denies nausea, heartburn or change in bowel habits Skin: Denies abnormal skin rashes Lymphatics: Denies new lymphadenopathy or easy bruising Behavioral/Psych: Mood is stable, no new changes  All other systems were reviewed with the patient and are negative.  I have reviewed the past medical history, past surgical history, social history and family history with the patient and they are unchanged from previous note.  ALLERGIES:  is allergic to codeine, thimerosal, ciprofloxacin, daypro [oxaprozin], levofloxacin, stadol [butorphanol tartrate], and sulfa antibiotics.  MEDICATIONS:  Current Outpatient Medications  Medication Sig Dispense Refill   acetaminophen (TYLENOL) 500 MG tablet Take 1,000 mg by mouth every 6 (six) hours as needed for moderate pain.     anastrozole (ARIMIDEX) 1 MG tablet Take 1 tablet (1 mg total) by mouth daily. 90 tablet 3   aspirin EC 81 MG tablet Take 81 mg by mouth daily. Swallow whole.  atorvastatin (LIPITOR) 40 MG tablet Take 1 tablet (40 mg total) by mouth daily. 90 tablet 3   Biotin 5 MG TABS Take 5 mg by mouth daily.     buPROPion (WELLBUTRIN SR) 200 MG 12 hr tablet Take 1 tablet (200 mg total) by mouth  2 (two) times daily. 180 tablet 0   Ca Phosphate-Cholecalciferol (EQL CALCIUM GUMMIES) 250-400 MG-UNIT CHEW Chew 1 tablet by mouth 2 (two) times daily.     escitalopram (LEXAPRO) 20 MG tablet Take 1 tablet (20 mg total) by mouth daily. 90 tablet 3   gabapentin (NEURONTIN) 300 MG capsule Take 1 capsule (300 mg total) by mouth 2 (two) times daily. 60 capsule 3   metFORMIN (GLUCOPHAGE) 500 MG tablet Take 1 tablet by mouth every morning and 2 tablets every evening as directed (need office visit for refills) 270 tablet 0   mirabegron ER (MYRBETRIQ) 50 MG TB24 tablet Take 1 tablet by mouth once daily 90 tablet 3   nitroGLYCERIN (NITROSTAT) 0.4 MG SL tablet Place 1 tablet (0.4 mg total) under the tongue every 5 (five) minutes as needed for chest pain. 25 tablet 3   omeprazole (PRILOSEC) 20 MG capsule Take 1 capsule by mouth once a day 90 capsule 3   ondansetron (ZOFRAN) 8 MG tablet Take 1 tablet (8 mg total) by mouth every 8 (eight) hours as needed for nausea or vomiting. 60 tablet 0   Polyethyl Glycol-Propyl Glycol (SYSTANE OP) Place 1 drop into both eyes daily as needed (dry eyes).     Prenatal MV & Min w/FA-DHA (PRENATAL GUMMIES) 0.18-25 MG CHEW Chew 1 capsule by mouth daily.     Probiotic Product (ALIGN PO) Take 1 tablet by mouth daily.      prochlorperazine (COMPAZINE) 10 MG tablet Take 1 tablet by mouth every 6 hours as needed for nausea or vomiting. 30 tablet 1   Vitamin D, Ergocalciferol, (DRISDOL) 1.25 MG (50000 UNIT) CAPS capsule Take 1 capsule (50,000 Units total) by mouth every 7 (seven) days. (Need Office Visit) 12 capsule 0   No current facility-administered medications for this visit.   Facility-Administered Medications Ordered in Other Visits  Medication Dose Route Frequency Provider Last Rate Last Admin   leuprolide (LUPRON) injection 3.75 mg  3.75 mg Intramuscular Once Heath Lark, MD        SUMMARY OF ONCOLOGIC HISTORY: Oncology History Overview Note  2018 - Core biopsy for ER/PR  and Foundation One testing performed. Unfortunately, no sufficient tissue for Foundation One testing. ER was 50% and PR 90%. 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     - 09/2000 Chemotherapy   6 cycles of IP cisplatin and etoposide    02/2009 Relapse/Recurrence   CT showed increased size of nodules in pelvis   2010 Surgery   Exlap with section of tumor nodules near cecum and left pelvic sidewall. Tumor: ER negative, PR positive    Treatment Plan Change   Alternated 2 week courses of  Megace and Tamoxifen - ended 03/2012   03/2012 PET scan   CT - progressive disease   2013 Treatment Plan Change   Letrozole   01/2016 Imaging   MRI showed progressive disease   2017 Treatment Plan Change   Two weeks of alternating Tamoxifen 20mg  daily and then Megace 40mg  TID   08/2016 Imaging   MRI showed progressive disease with peritoneal implants near liver, in pelvis   01/2017 Treatment Plan Change   Lupron 11.25 q 3 months   11/2017 Imaging   Overall mixed response.   Mixed cystic/solid lesions in the left pelvis are mildly improved.   Cystic peritoneal disease, including the dominant lesion along the posterior right hepatic lobe, is mildly progressed.   Subcapsular lesion along the posterior right hepatic lobe is unchanged.   03/2018 Imaging   MRI  A/P: Mixed response of individual peritoneal metastases in the pelvis, as described above. Overall, there has been no significant change in bulk of disease.   Stable cystic peritoneal metastatic disease along the capsular surfaces of the liver and spleen.   No new sites of metastatic disease identified within the abdomen or pelvis.   08/2018 Imaging   MRI A/P: Status post hysterectomy and bilateral salpingo-oophorectomy.   Mixed cystic/solid peritoneal implants in the abdomen/pelvis, as above. Dominant cystic implant along the posterior liver surface is mildly increased. Remaining lesions are overall grossly unchanged.   No new lesions are identified.   01/28/2019 Imaging   Mri A/P: 1. Relatively similar appearance of peritoneal metastasis. A posterior right hepatic capsular based lesion is similar to minimally decreased in size. Left pelvic implants are primarily similar with possible enlargement of an anterior high left pelvic cystic implant. No new disease identified. 2.  Aortic Atherosclerosis (ICD10-I70.0). 3. Hepatic steatosis. 4. Left adrenal adenoma.   06/02/2019 Imaging   MRI 1. Potential slight enlargement of dominant cystic area and solid component, associated with rind like signal variation on T2 along the inferior right hepatic margin, also potentially slightly increased. Findings may still be within the realm of measurement and technical variation. Close attention on follow-up. 2. Subtle cystic changes along the cephalad margin of the spleen are difficult to see on previous imaging, perhaps new compared with prior imaging studies. 3. Pelvic implants and left lower quadrant lesion with similar size, of the area along the left iliac vasculature may be slightly larger than on the prior study. 4. Signs of extensive retroperitoneal and pelvic lymphadenectomy. 5. Hepatic steatosis. 6. 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      06/08/2019 Cancer Staging   Staging form: [place], AJCC 7th Edition - Clinical: Stage IIIC (rT2, N1, M0) - Signed by Heath Lark, MD on 06/08/2019   06/10/2019 Imaging   1. 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 These findings are slightly worsened in comparison to a remote prior CT examination dated 10/04/2014.   2.  No evidence of metastatic disease in the chest.   3. Status post hysterectomy, pelvic and retroperitoneal lymph node dissection, and ventral hernia mesh repair.   4.  Hepatic steatosis.   5.  Aortic Atherosclerosis (ICD10-I70.0).   06/24/2019 - 09/02/2019 Chemotherapy   The patient had carboplatin for chemotherapy treatment.     09/02/2019 Tumor Marker   Patient's tumor was tested for  the following markers: Inhibin B Results of the tumor marker test revealed 94   09/23/2019 Imaging   1. Interval progression of the soft tissue lesions in the anterior left pelvis, likely peritoneal implants, compatible with disease progression. Remaining sites of apparent disease along the liver capsule and posterior spleen are stable 2. Stable 2 cm left adrenal adenoma. 3. Hepatic steatosis. 4. Right-side predominant colonic diverticulosis without diverticulitis. 5. Aortic Atherosclerosis (ICD10-I70.0).   12/23/2019 Imaging   1. Slight interval increase in size of a mixed solid and cystic nodule in the left hemipelvis measuring 3.0 x 2.9 cm, previously 2.7 x 2.1 cm. 2. Interval decrease in size of a nodule in the left paracolic gutter measuring 1.0 x 0.8 cm, previously 2.1 x 1.6 cm. 3. Stable peritoneal nodules in the hepatorenal recess and at the inferior tip of the spleen. 4. Unchanged nodule or lymph node overlying the left external iliac artery. 5. Enlargement of dominant left pelvic nodule is concerning for  disease progression despite interval decrease in size of a nodule in the left paracolic gutter and stability of other nodules. 6. No evidence of metastatic disease in the chest. 7. Stable, benign left adrenal adenoma. 8. Ventral hernia mesh repair with a small component of recurrent hernia inferiorly, containing a single nonobstructed loop of small bowel. 9. Hepatic steatosis. 10. Aortic Atherosclerosis (ICD10-I70.0).   12/23/2019 Tumor Marker   Patient's tumor was tested for the following markers: Inhibin B Results of the tumor marker test revealed 94.9   01/25/2020 Tumor Marker   Patient's tumor was tested for the following markers: Inhibin B Results of the tumor marker test revealed 116.7   02/20/2020 Genetic Testing   Negative genetic testing: no pathogenic variants detected in [facility] Multi-Cancer Panel.  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  The report date is February 20, 2020.   The Multi-Cancer Panel offered by [facility] includes sequencing and/or deletion duplication testing of the following 85 genes: AIP, ALK, APC, ATM, AXIN2,BAP1,  BARD1, BLM, BMPR1A, BRCA1, BRCA2, BRIP1, CASR, CDC73, CDH1, CDK4, CDKN1B, CDKN1C, CDKN2A (p14ARF), CDKN2A (p16INK4a), CEBPA, CHEK2, CTNNA1, DICER1, DIS3L2, EGFR (c.2369C>T, p.Thr790Met variant only), EPCAM (Deletion/duplication testing only), FH, FLCN, GATA2, GPC3, GREM1 (Promoter region deletion/duplication testing only), HOXB13 (c.251G>A, p.Gly84Glu), HRAS, KIT, MAX, MEN1, MET, MITF (c.952G>A, p.Glu318Lys variant only), MLH1, MSH2, MSH3, MSH6, MUTYH, NBN, NF1, NF2, NTHL1, PALB2, PDGFRA, PHOX2B, PMS2, POLD1, POLE, POT1, PRKAR1A, PTCH1, PTEN, RAD50, RAD51C, RAD51D, RB1, RECQL4, RET, RNF43, RUNX1, SDHAF2, SDHA (sequence changes only), SDHB, SDHC, SDHD, SMAD4, SMARCA4, SMARCB1, SMARCE1, STK11, SUFU, TERC, TERT, TMEM127, TP53, TSC1, TSC2, VHL, WRN and WT1.    03/02/2020 Tumor Marker   Patient's tumor was tested for the following  markers: Inhibin B Results of the tumor marker test revealed 80.8   04/17/2020 Imaging   1. Overall, exam is stable. Multiple peritoneal nodules are again seen. The index nodule in the left lower quadrant is mildly increased in size in the interval. The lesion within the anterior left pelvis has decreased in size in the interval. There has also been decrease in size of cystic lesion within the a hepatorenal recess. The remaining peritoneal lesions are unchanged. No new lesions identified. 2. Stable left adrenal nodule. 3.  Aortic Atherosclerosis (ICD10-I70.0).     08/21/2020 Imaging   Bone density is normal AP spine T score 0.8 Femoral neck on the left, T score -0.2 Femoral neck on the right ,T score -0.4   10/17/2020 Imaging   1. Interval progression of peritoneal  nodules along the left pelvic sidewall, concerning for progressive metastatic disease. 2. Small capsular lesion medial right liver and the apparent cystic lesion superior to the right kidney are similar to prior. 3. Tiny soft tissue nodule anterior aspect of the lateral left pelvis described as decreasing on the prior study has decreased further on today's exam. 4. Stable left adrenal nodule. This cannot be definitively characterized. 5. Tiny nonobstructing stone lower pole right kidney. 6. Aortic Atherosclerosis (ICD10-I70.0).   10/26/2020 Procedure   Successful placement of a right internal jugular approach power injectable Port-A-Cath. The catheter is ready for immediate use.       10/31/2020 - 10/04/2021 Chemotherapy   Patient is on Treatment Plan : OVARIAN Paclitaxel T2,4,58,09 q28d     01/25/2021 Imaging   1. Signs of peritoneal disease with scattered peritoneal nodules. The index lesions are either stable or decreased in size in the interval as detailed above. No new sites of disease. 2. Ventral pelvic wall hernia contains a nonobstructed loop of small bowel. 3. Aortic Atherosclerosis (ICD10-I70.0).     06/14/2021  Imaging   1. Multiple peritoneal metastatic nodules are slightly diminished in size. 2. A left external iliac lymph node or nodule is however stable to slightly enlarged. 3. Overall findings are most consistent with continued treatment response of metastatic disease. 4. No evidence of metastatic disease within the chest. 5. Hepatic steatosis.     06/14/2021 Imaging   Right: Findings consistent with 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   Left: No evidence of thrombosis in the subclavian.   06/20/2021 Procedure   Successful right IJ vein Port-A-Cath explant.     10/18/2021 Imaging   1. Similar to mild interval increase in size of left external iliac lymph node. 2. Multiple peritoneal metastatic nodules are grossly unchanged in size when compared to prior exam     PHYSICAL EXAMINATION: ECOG PERFORMANCE STATUS: 1 - Symptomatic but completely ambulatory  Vitals:   12/27/21 1237  BP: 126/60  Pulse: 75  Resp: 18  Temp: 97.8 F (36.6 C)  SpO2: 99%   Filed Weights   12/27/21 1237  Weight: 189 lb 4.8 oz (85.9 kg)    GENERAL:alert, no distress and comfortable  NEURO: alert & oriented x 3 with fluent speech, no focal motor/sensory deficits  LABORATORY DATA:  I have reviewed the data as listed    Component Value Date/Time   NA 140 11/23/2021 0831   NA 140 07/09/2021 0813   NA 141 09/24/2013 1131   K 4.3 11/23/2021 0831   K 4.5 09/24/2013 1131   CL 106 11/23/2021 0831   CO2 29 11/23/2021 0831   CO2 24 09/24/2013 1131   GLUCOSE 105 (H) 11/23/2021 0831   GLUCOSE 90 09/24/2013 1131   BUN 27 (H) 11/23/2021 0831   BUN 20 07/09/2021 0813   BUN 21.2 09/24/2013 1131   CREATININE 1.15 (H) 11/23/2021 0831   CREATININE 1.19 (H) 06/20/2021 1513   CREATININE 1.1 09/24/2013 1131   CALCIUM 8.9 11/23/2021 0831   CALCIUM 9.5 09/24/2013 1131   PROT 6.8 11/23/2021 0831   PROT 6.7 11/02/2021 0000   PROT 6.8  09/24/2013 1131   ALBUMIN 4.0 11/23/2021 0831   ALBUMIN 4.4 11/02/2021 0000   ALBUMIN 3.9 09/24/2013 1131   AST 15 11/23/2021 0831   AST 21 06/20/2021 1513   AST 23 09/24/2013 1131   ALT 14 11/23/2021 0831   ALT 19 06/20/2021 1513   ALT  27 09/24/2013 1131   ALKPHOS 82 11/23/2021 0831   ALKPHOS 81 09/24/2013 1131   BILITOT 0.4 11/23/2021 0831   BILITOT 0.4 11/02/2021 0000   BILITOT 0.4 06/20/2021 1513   BILITOT 0.53 09/24/2013 1131   GFRNONAA 53 (L) 11/23/2021 0831   GFRNONAA 51 (L) 06/20/2021 1513   GFRAA 55 (L) 01/24/2020 1530   GFRAA 57 (L) 12/21/2019 1259    No results found for: "SPEP", "UPEP"  Lab Results  Component Value Date   WBC 5.9 11/23/2021   NEUTROABS 3.7 11/23/2021   HGB 12.6 11/23/2021   HCT 37.5 11/23/2021   MCV 89.9 11/23/2021   PLT 287 11/23/2021      Chemistry      Component Value Date/Time   NA 140 11/23/2021 0831   NA 140 07/09/2021 0813   NA 141 09/24/2013 1131   K 4.3 11/23/2021 0831   K 4.5 09/24/2013 1131   CL 106 11/23/2021 0831   CO2 29 11/23/2021 0831   CO2 24 09/24/2013 1131   BUN 27 (H) 11/23/2021 0831   BUN 20 07/09/2021 0813   BUN 21.2 09/24/2013 1131   CREATININE 1.15 (H) 11/23/2021 0831   CREATININE 1.19 (H) 06/20/2021 1513   CREATININE 1.1 09/24/2013 1131      Component Value Date/Time   CALCIUM 8.9 11/23/2021 0831   CALCIUM 9.5 09/24/2013 1131   ALKPHOS 82 11/23/2021 0831   ALKPHOS 81 09/24/2013 1131   AST 15 11/23/2021 0831   AST 21 06/20/2021 1513   AST 23 09/24/2013 1131   ALT 14 11/23/2021 0831   ALT 19 06/20/2021 1513   ALT 27 09/24/2013 1131   BILITOT 0.4 11/23/2021 0831   BILITOT 0.4 11/02/2021 0000   BILITOT 0.4 06/20/2021 1513   BILITOT 0.53 09/24/2013 1131

## 2021-12-27 NOTE — Assessment & Plan Note (Signed)
She has mild persistent peripheral neuropathy but not taking gabapentin consistently I recommend the patient to take it consistently to treat this and hot flashes

## 2021-12-28 ENCOUNTER — Other Ambulatory Visit (HOSPITAL_COMMUNITY): Payer: Self-pay

## 2021-12-28 DIAGNOSIS — Z0000 Encounter for general adult medical examination without abnormal findings: Secondary | ICD-10-CM | POA: Diagnosis not present

## 2021-12-28 DIAGNOSIS — F322 Major depressive disorder, single episode, severe without psychotic features: Secondary | ICD-10-CM | POA: Diagnosis not present

## 2021-12-28 DIAGNOSIS — G4733 Obstructive sleep apnea (adult) (pediatric): Secondary | ICD-10-CM | POA: Diagnosis not present

## 2021-12-28 DIAGNOSIS — E7800 Pure hypercholesterolemia, unspecified: Secondary | ICD-10-CM | POA: Diagnosis not present

## 2021-12-28 DIAGNOSIS — N3281 Overactive bladder: Secondary | ICD-10-CM | POA: Diagnosis not present

## 2021-12-28 DIAGNOSIS — Z800 Family history of malignant neoplasm of digestive organs: Secondary | ICD-10-CM | POA: Diagnosis not present

## 2021-12-28 DIAGNOSIS — K219 Gastro-esophageal reflux disease without esophagitis: Secondary | ICD-10-CM | POA: Diagnosis not present

## 2021-12-28 DIAGNOSIS — Z23 Encounter for immunization: Secondary | ICD-10-CM | POA: Diagnosis not present

## 2021-12-28 DIAGNOSIS — D3910 Neoplasm of uncertain behavior of unspecified ovary: Secondary | ICD-10-CM | POA: Diagnosis not present

## 2021-12-28 DIAGNOSIS — E669 Obesity, unspecified: Secondary | ICD-10-CM | POA: Diagnosis not present

## 2021-12-28 MED ORDER — MYRBETRIQ 50 MG PO TB24
50.0000 mg | ORAL_TABLET | Freq: Every day | ORAL | 11 refills | Status: DC
  Filled 2021-12-28: qty 30, 30d supply, fill #0
  Filled 2022-02-10: qty 30, 30d supply, fill #1
  Filled 2022-07-14 – 2022-07-29 (×2): qty 30, 30d supply, fill #2

## 2022-01-01 NOTE — Progress Notes (Signed)
[facility] Office Note:    Date:  01/01/2022   ID:  [name], DOB 11-13-1955, MRN [id number]  PCP:  [name], MD   [facility] Cardiologist:  Lenna Sciara, MD Referring MD: [name], MD   Chief Complaint/Reason for Referral: Chest pain  PATIENT DID NOT APPEAR FOR APPOINTMENT  ASSESSMENT:    1. Precordial pain   2. Malignant granulosa cell tumor of ovary, unspecified laterality (HCC)   3. Stage 3 chronic kidney disease, unspecified whether stage 3a or 3b CKD ([place])   4. Aortic atherosclerosis ([place])   5. Hyperlipidemia LDL goal <70      PLAN:    In order of problems listed above: 1.  Chest pain: CTA was reassuring.  If Imdur has helped patient may require GI evaluation as this would suggest an esophageal etiology of chest pain.  Follow-up in 18 months or earlier if needed. 2.  Malignant ovarian cancer: Continue current therapy, this is being followed by other providers. 3.  Stage III chronic kidney disease: Monitor for now 4.  Aortic atherosclerosis: Continue aspirin and and atorvastatin.  LP(a) is low.  LDL is less than 70 on labs done last month. 5.  Hyperlipidemia: See discussion above      Dispo:  No follow-ups on file.        History of Present Illness:    FOCUSED PROBLEM LIST:   1.  Metastatic ovarian cancer on Taxol chemotherapy 2.  Aortic atherosclerosis on CT scan 2023 3.  Stage III chronic kidney disease 4.  Hyperlipidemia; LP(a) of 8.8 5.  Coronary artery calcium score of 0 CTA 2023  May 2023: Patient was seen for initial consultation regarding chest pain.  She was judged to have stable anginal symptoms.  Review of CT imaging done for oncologic purposes demonstrated no coronary artery calcification but did show aortic atherosclerosis.  She was started on Imdur 30 mg, as needed nitroglycerin, and a coronary CTA was performed.  This showed no obstructive coronary artery disease.  Today: In the interim the patient has been  seen by oncology.  She had a CT scan that I reviewed which demonstrated mild increase in the size of one of her lymph nodes.  Lipid panel was drawn which showed an LDL of 69 last month and LFTs were unremarkable.    Current Medications: No outpatient medications have been marked as taking for the 01/02/22 encounter (Office Visit) with Early Osmond, MD.     Allergies:    Codeine, Thimerosal, Ciprofloxacin, Daypro [oxaprozin], Levofloxacin, Stadol [butorphanol tartrate], and Sulfa antibiotics   Social History:   Social History   Tobacco Use   Smoking status: Never   Smokeless tobacco: Never  Vaping Use   Vaping Use: Never used  Substance Use Topics   Alcohol use: Not Currently   Drug use: No     Family Hx: Family History  Problem Relation Age of Onset   Basal cell carcinoma Mother        68s   Thyroid disease Mother    Stroke Father    Colon cancer Father 44   Hypertension Father    Heart disease Father    Colon cancer Paternal Uncle        dx 67s   Colon cancer Cousin        maternal cousin; dx 70s     Review of Systems:   Please see the history of present illness.    All other systems reviewed and are  negative.     EKGs/Labs/Other Test Reviewed:    EKG:   EKG performed today that I personally reviewed demonstrates sinus bradycardia.  Prior CV studies: CTA 2023: 1. Calcium score 0 2.  Ascending thoracic aorta 3.7 cm 3.  Normal right dominant coronary arteries  TTE 2023:  1. Left ventricular ejection fraction, by estimation, is 60 to 65%. The  left ventricle has normal function. The left ventricle has no regional  wall motion abnormalities. Left ventricular diastolic parameters are  consistent with Grade I diastolic  dysfunction (impaired relaxation).   2. Right ventricular systolic function is normal. The right ventricular  size is normal. There is normal pulmonary artery systolic pressure. The  estimated right ventricular systolic pressure is 65.4  mmHg.   3. The mitral valve is normal in structure. Mild mitral valve  regurgitation. No evidence of mitral stenosis.   4. The aortic valve is normal in structure. Aortic valve regurgitation is  not visualized. No aortic stenosis is present.   5. The inferior vena cava is normal in size with greater than 50%  respiratory variability, suggesting right atrial pressure of 3 mmHg.   Other studies Reviewed: Review of the additional studies/records demonstrates: Chest CT 2023 demonstrates aortic atherosclerosis and no coronary artery calcification  Recent Labs: 07/09/2021: TSH 0.679 11/23/2021: ALT 14; BUN 27; Creatinine, Ser 1.15; Hemoglobin 12.6; Platelets 287; Potassium 4.3; Sodium 140   Recent Lipid Panel Lab Results  Component Value Date/Time   CHOL 255 (H) 07/09/2021 08:13 AM   TRIG 126 07/09/2021 08:13 AM   HDL 50 07/09/2021 08:13 AM   LDLCALC 182 (H) 07/09/2021 08:13 AM    Risk Assessment/Calculations:           Physical Exam:      Signed, Early Osmond, MD  01/01/2022 12:26 PM    [facility] [address], [place], [place]  [address] Phone: [phone]; Fax: [phone]   Note:  This document was prepared using Dragon voice recognition software and may include unintentional dictation errors.

## 2022-01-02 ENCOUNTER — Ambulatory Visit (INDEPENDENT_AMBULATORY_CARE_PROVIDER_SITE_OTHER): Payer: 59 | Admitting: Internal Medicine

## 2022-01-02 DIAGNOSIS — R072 Precordial pain: Secondary | ICD-10-CM

## 2022-01-02 DIAGNOSIS — N183 Chronic kidney disease, stage 3 unspecified: Secondary | ICD-10-CM

## 2022-01-02 DIAGNOSIS — E785 Hyperlipidemia, unspecified: Secondary | ICD-10-CM

## 2022-01-02 DIAGNOSIS — C569 Malignant neoplasm of unspecified ovary: Secondary | ICD-10-CM

## 2022-01-02 DIAGNOSIS — I700 Atherosclerosis of aorta: Secondary | ICD-10-CM

## 2022-01-02 NOTE — Progress Notes (Signed)
[facility] Office Note:    Date:  01/03/2022   ID:  [name], DOB January 27, 1956, MRN [id number]  PCP:  [name], MD   [facility] Cardiologist:  Lenna Sciara, MD Referring MD: [name], MD   Chief Complaint/Reason for Referral: Chest pain ASSESSMENT:    1. Precordial pain   2. Malignant granulosa cell tumor of ovary, unspecified laterality (HCC)   3. Stage 3 chronic kidney disease, unspecified whether stage 3a or 3b CKD ([place])   4. Aortic atherosclerosis ([place])   5. Hyperlipidemia LDL goal <70       PLAN:    In order of problems listed above: 1.  Chest pain: CTA was reassuring.  The patient did not end up taking Imdur.  She still has some exertional chest pain.  We will trial Imdur 30 mg.  If Imdur helps then patient may require GI evaluation as this would suggest an esophageal etiology of chest pain.  Follow-up in 12 months or earlier if needed. 2.  Malignant ovarian cancer: Continue current therapy, this is being followed by other providers. 3.  Stage III chronic kidney disease: Monitor for now 4.  Aortic atherosclerosis: Continue aspirin and and atorvastatin.  LP(a) is low.  LDL is less than 70 on labs done last month. 5.  Hyperlipidemia: See discussion above    Dispo:  Return in about 1 year (around 01/04/2023).      Medication Adjustments/Labs and Tests Ordered: Current medicines are reviewed at length with the patient today.  Concerns regarding medicines are outlined above.  The following changes have been made:     Labs/tests ordered: No orders of the defined types were placed in this encounter.   Medication Changes: Meds ordered this encounter  Medications   isosorbide mononitrate (IMDUR) 30 MG 24 hr tablet    Sig: Take 1 tablet (30 mg total) by mouth daily.    Dispense:  90 tablet    Refill:  3     Current medicines are reviewed at length with the patient today.  The patient does not have concerns regarding  medicines.   History of Present Illness:    FOCUSED PROBLEM LIST:   1.  Metastatic ovarian cancer on Taxol chemotherapy 2.  Aortic atherosclerosis on CT scan 2023 3.  Stage III chronic kidney disease 4.  Hyperlipidemia; LP(a) of 8.8 5.  Coronary artery calcium score of 0 CTA 2023  May 2023: Patient was seen for initial consultation regarding chest pain.  She was judged to have stable anginal symptoms.  Review of CT imaging done for oncologic purposes demonstrated no coronary artery calcification but did show aortic atherosclerosis.  She was started on Imdur 30 mg, as needed nitroglycerin, and a coronary CTA was performed.  This showed no obstructive coronary artery disease.  Today: In the interim the patient has been seen by oncology.  She had a CT scan that I reviewed which demonstrated mild increase in the size of one of her lymph nodes.  Lipid panel was drawn which showed an LDL of 69 last month and LFTs were unremarkable.  She is doing very well.  She swam without any chest pain.  Curiously sometimes when she walks she gets chest tightness.  She did not end up starting Imdur last time when it was prescribed.  She fortunately has not required any emergency room visits or hospitalizations.  She is otherwise well without significant cardiovascular complaints today.    Current Medications: Current Meds  Medication Sig  acetaminophen (TYLENOL) 500 MG tablet Take 1,000 mg by mouth every 6 (six) hours as needed for moderate pain.   anastrozole (ARIMIDEX) 1 MG tablet Take 1 tablet (1 mg total) by mouth daily.   aspirin EC 81 MG tablet Take 81 mg by mouth daily. Swallow whole.   atorvastatin (LIPITOR) 40 MG tablet Take 1 tablet (40 mg total) by mouth daily.   Biotin 5 MG TABS Take 5 mg by mouth daily.   buPROPion (WELLBUTRIN SR) 200 MG 12 hr tablet Take 1 tablet (200 mg total) by mouth 2 (two) times daily.   Ca Phosphate-Cholecalciferol (EQL CALCIUM GUMMIES) 250-400 MG-UNIT CHEW Chew 1 tablet  by mouth 2 (two) times daily.   escitalopram (LEXAPRO) 20 MG tablet Take 1 tablet (20 mg total) by mouth daily.   gabapentin (NEURONTIN) 300 MG capsule Take 1 capsule (300 mg total) by mouth 2 (two) times daily.   isosorbide mononitrate (IMDUR) 30 MG 24 hr tablet Take 1 tablet (30 mg total) by mouth daily.   metFORMIN (GLUCOPHAGE) 500 MG tablet Take 1 tablet by mouth every morning and 2 tablets every evening as directed (need office visit for refills)   mirabegron ER (MYRBETRIQ) 50 MG TB24 tablet Take 1 tablet (50 mg total) by mouth daily.   nitroGLYCERIN (NITROSTAT) 0.4 MG SL tablet Place 1 tablet (0.4 mg total) under the tongue every 5 (five) minutes as needed for chest pain.   omeprazole (PRILOSEC) 20 MG capsule Take 1 capsule by mouth once a day   Polyethyl Glycol-Propyl Glycol (SYSTANE OP) Place 1 drop into both eyes daily as needed (dry eyes).   Prenatal MV & Min w/FA-DHA (PRENATAL GUMMIES) 0.18-25 MG CHEW Chew 1 capsule by mouth daily.   Probiotic Product (ALIGN PO) Take 1 tablet by mouth daily.    Vitamin D, Ergocalciferol, (DRISDOL) 1.25 MG (50000 UNIT) CAPS capsule Take 1 capsule (50,000 Units total) by mouth every 7 (seven) days. (Need Office Visit)   [DISCONTINUED] ondansetron (ZOFRAN) 8 MG tablet Take 1 tablet (8 mg total) by mouth every 8 (eight) hours as needed for nausea or vomiting.   [DISCONTINUED] prochlorperazine (COMPAZINE) 10 MG tablet Take 1 tablet by mouth every 6 hours as needed for nausea or vomiting.     Allergies:    Codeine, Thimerosal, Ciprofloxacin, Daypro [oxaprozin], Levofloxacin, Stadol [butorphanol tartrate], and Sulfa antibiotics   Social History:   Social History   Tobacco Use   Smoking status: Never   Smokeless tobacco: Never  Vaping Use   Vaping Use: Never used  Substance Use Topics   Alcohol use: Not Currently   Drug use: No     Family Hx: Family History  Problem Relation Age of Onset   Basal cell carcinoma Mother        61s   Thyroid  disease Mother    Stroke Father    Colon cancer Father 26   Hypertension Father    Heart disease Father    Colon cancer Paternal Uncle        dx 62s   Colon cancer Cousin        maternal cousin; dx 69s     Review of Systems:   Please see the history of present illness.    All other systems reviewed and are negative.     EKGs/Labs/Other Test Reviewed:    EKG:   EKG performed today that I personally reviewed demonstrates sinus bradycardia.  Prior CV studies: CTA 2023: 1. Calcium score 0 2.  Ascending thoracic  aorta 3.7 cm 3.  Normal right dominant coronary arteries  TTE 2023:  1. Left ventricular ejection fraction, by estimation, is 60 to 65%. The  left ventricle has normal function. The left ventricle has no regional  wall motion abnormalities. Left ventricular diastolic parameters are  consistent with Grade I diastolic  dysfunction (impaired relaxation).   2. Right ventricular systolic function is normal. The right ventricular  size is normal. There is normal pulmonary artery systolic pressure. The  estimated right ventricular systolic pressure is 83.7 mmHg.   3. The mitral valve is normal in structure. Mild mitral valve  regurgitation. No evidence of mitral stenosis.   4. The aortic valve is normal in structure. Aortic valve regurgitation is  not visualized. No aortic stenosis is present.   5. The inferior vena cava is normal in size with greater than 50%  respiratory variability, suggesting right atrial pressure of 3 mmHg.   Other studies Reviewed: Review of the additional studies/records demonstrates: Chest CT 2023 demonstrates aortic atherosclerosis and no coronary artery calcification  Recent Labs: 07/09/2021: TSH 0.679 11/23/2021: ALT 14; BUN 27; Creatinine, Ser 1.15; Hemoglobin 12.6; Platelets 287; Potassium 4.3; Sodium 140   Recent Lipid Panel Lab Results  Component Value Date/Time   CHOL 255 (H) 07/09/2021 08:13 AM   TRIG 126 07/09/2021 08:13 AM   HDL 50  07/09/2021 08:13 AM   LDLCALC 182 (H) 07/09/2021 08:13 AM    Risk Assessment/Calculations:           Physical Exam:    VS:  BP 130/82   Pulse 70   Ht '5\' 6"'$  (1.676 m)   Wt 190 lb (86.2 kg)   SpO2 94%   BMI 30.67 kg/m    Wt Readings from Last 3 Encounters:  01/03/22 190 lb (86.2 kg)  12/27/21 189 lb 4.8 oz (85.9 kg)  11/29/21 199 lb (90.3 kg)    GENERAL:  No apparent distress, AOx3 HEENT:  No carotid bruits, +2 carotid impulses, no scleral icterus CAR: RRR no murmurs, gallops, rubs, or thrills RES:  Clear to auscultation bilaterally ABD:  Soft, nontender, nondistended, positive bowel sounds x 4 VASC:  +2 radial pulses, +2 carotid pulses, palpable pedal pulses NEURO:  CN 2-12 grossly intact; motor and sensory grossly intact PSYCH:  No active depression or anxiety EXT:  No edema, ecchymosis, or cyanosis  Signed, Early Osmond, MD  01/03/2022 10:08 AM    [address], [place], [place]  [address] Phone: [phone]; Fax: [phone]   Note:  This document was prepared using Dragon voice recognition software and may include unintentional dictation errors.

## 2022-01-03 ENCOUNTER — Other Ambulatory Visit (HOSPITAL_COMMUNITY): Payer: Self-pay

## 2022-01-03 ENCOUNTER — Encounter: Payer: Self-pay | Admitting: Internal Medicine

## 2022-01-03 ENCOUNTER — Ambulatory Visit: Payer: 59 | Attending: Internal Medicine | Admitting: Internal Medicine

## 2022-01-03 VITALS — BP 130/82 | HR 70 | Ht 66.0 in | Wt 190.0 lb

## 2022-01-03 DIAGNOSIS — E785 Hyperlipidemia, unspecified: Secondary | ICD-10-CM | POA: Diagnosis not present

## 2022-01-03 DIAGNOSIS — I700 Atherosclerosis of aorta: Secondary | ICD-10-CM

## 2022-01-03 DIAGNOSIS — R072 Precordial pain: Secondary | ICD-10-CM

## 2022-01-03 DIAGNOSIS — C569 Malignant neoplasm of unspecified ovary: Secondary | ICD-10-CM | POA: Diagnosis not present

## 2022-01-03 DIAGNOSIS — N183 Chronic kidney disease, stage 3 unspecified: Secondary | ICD-10-CM

## 2022-01-03 MED ORDER — ISOSORBIDE MONONITRATE ER 30 MG PO TB24
30.0000 mg | ORAL_TABLET | Freq: Every day | ORAL | 3 refills | Status: DC
  Filled 2022-01-03: qty 90, 90d supply, fill #0

## 2022-01-03 NOTE — Patient Instructions (Addendum)
Medication Instructions:  Your physician has recommended you make the following change in your medication:  1) START taking Imdur (isosorbide mononitrate) 30 mg daily  *If you need a refill on your cardiac medications before your next appointment, please call your pharmacy*  Follow-Up: At [facility], you and your health needs are our priority.  As part of our continuing mission to provide you with exceptional heart care, we have created designated Provider Care Teams.  These Care Teams include your primary Cardiologist (physician) and Advanced Practice Providers (APPs -  Physician Assistants and Nurse Practitioners) who all work together to provide you with the care you need, when you need it.  Your next appointment:   1 year(s)  The format for your next appointment:   In Person  Provider:   Early Osmond, MD     Important Information About Sugar

## 2022-01-10 ENCOUNTER — Other Ambulatory Visit (HOSPITAL_COMMUNITY): Payer: Self-pay

## 2022-01-10 ENCOUNTER — Other Ambulatory Visit (INDEPENDENT_AMBULATORY_CARE_PROVIDER_SITE_OTHER): Payer: Self-pay | Admitting: Family Medicine

## 2022-01-10 DIAGNOSIS — E559 Vitamin D deficiency, unspecified: Secondary | ICD-10-CM

## 2022-01-17 ENCOUNTER — Inpatient Hospital Stay: Payer: 59

## 2022-01-17 VITALS — BP 115/49 | HR 64 | Temp 98.0°F

## 2022-01-17 DIAGNOSIS — C569 Malignant neoplasm of unspecified ovary: Secondary | ICD-10-CM

## 2022-01-17 MED ORDER — LEUPROLIDE ACETATE 3.75 MG IM KIT: 3.7500 mg | PACK | Freq: Once | INTRAMUSCULAR | Status: DC

## 2022-01-18 ENCOUNTER — Encounter: Payer: Self-pay | Admitting: Hematology and Oncology

## 2022-01-24 ENCOUNTER — Other Ambulatory Visit: Payer: Self-pay | Admitting: Hematology and Oncology

## 2022-01-24 ENCOUNTER — Inpatient Hospital Stay: Payer: 59 | Attending: Hematology

## 2022-01-24 VITALS — BP 122/68 | HR 62 | Temp 98.2°F | Resp 18

## 2022-01-24 DIAGNOSIS — R300 Dysuria: Secondary | ICD-10-CM | POA: Insufficient documentation

## 2022-01-24 DIAGNOSIS — C569 Malignant neoplasm of unspecified ovary: Secondary | ICD-10-CM | POA: Insufficient documentation

## 2022-01-24 DIAGNOSIS — N183 Chronic kidney disease, stage 3 unspecified: Secondary | ICD-10-CM | POA: Diagnosis not present

## 2022-01-24 DIAGNOSIS — C786 Secondary malignant neoplasm of retroperitoneum and peritoneum: Secondary | ICD-10-CM | POA: Insufficient documentation

## 2022-01-24 MED ORDER — LEUPROLIDE ACETATE 3.75 MG IM KIT
3.7500 mg | PACK | Freq: Once | INTRAMUSCULAR | Status: AC
  Administered 2022-01-24: 3.75 mg via INTRAMUSCULAR
  Filled 2022-01-24: qty 3.75

## 2022-02-06 ENCOUNTER — Ambulatory Visit (INDEPENDENT_AMBULATORY_CARE_PROVIDER_SITE_OTHER): Payer: 59 | Admitting: Family Medicine

## 2022-02-07 ENCOUNTER — Inpatient Hospital Stay: Payer: 59

## 2022-02-10 ENCOUNTER — Other Ambulatory Visit (HOSPITAL_COMMUNITY): Payer: Self-pay

## 2022-02-11 ENCOUNTER — Encounter (INDEPENDENT_AMBULATORY_CARE_PROVIDER_SITE_OTHER): Payer: Self-pay | Admitting: Family Medicine

## 2022-02-11 ENCOUNTER — Ambulatory Visit (INDEPENDENT_AMBULATORY_CARE_PROVIDER_SITE_OTHER): Payer: 59 | Admitting: Family Medicine

## 2022-02-11 ENCOUNTER — Other Ambulatory Visit (HOSPITAL_COMMUNITY): Payer: Self-pay

## 2022-02-11 VITALS — BP 117/74 | HR 65 | Temp 97.6°F | Ht 66.0 in | Wt 183.0 lb

## 2022-02-11 DIAGNOSIS — G622 Polyneuropathy due to other toxic agents: Secondary | ICD-10-CM | POA: Diagnosis not present

## 2022-02-11 DIAGNOSIS — R7303 Prediabetes: Secondary | ICD-10-CM

## 2022-02-11 DIAGNOSIS — E559 Vitamin D deficiency, unspecified: Secondary | ICD-10-CM

## 2022-02-11 DIAGNOSIS — E669 Obesity, unspecified: Secondary | ICD-10-CM | POA: Diagnosis not present

## 2022-02-11 DIAGNOSIS — T426X5A Adverse effect of other antiepileptic and sedative-hypnotic drugs, initial encounter: Secondary | ICD-10-CM

## 2022-02-11 DIAGNOSIS — F3289 Other specified depressive episodes: Secondary | ICD-10-CM | POA: Diagnosis not present

## 2022-02-11 DIAGNOSIS — T451X5A Adverse effect of antineoplastic and immunosuppressive drugs, initial encounter: Secondary | ICD-10-CM

## 2022-02-11 DIAGNOSIS — Z6829 Body mass index (BMI) 29.0-29.9, adult: Secondary | ICD-10-CM | POA: Diagnosis not present

## 2022-02-11 MED ORDER — METFORMIN HCL 500 MG PO TABS
ORAL_TABLET | ORAL | 0 refills | Status: DC
  Filled 2022-02-11: qty 270, 90d supply, fill #0

## 2022-02-11 MED ORDER — VITAMIN D (ERGOCALCIFEROL) 1.25 MG (50000 UNIT) PO CAPS
50000.0000 [IU] | ORAL_CAPSULE | ORAL | 0 refills | Status: DC
  Filled 2022-02-11: qty 12, 84d supply, fill #0

## 2022-02-11 MED ORDER — BUPROPION HCL ER (SR) 200 MG PO TB12
200.0000 mg | ORAL_TABLET | Freq: Two times a day (BID) | ORAL | 0 refills | Status: DC
  Filled 2022-02-11: qty 180, 90d supply, fill #0

## 2022-02-12 ENCOUNTER — Inpatient Hospital Stay: Payer: 59

## 2022-02-12 ENCOUNTER — Ambulatory Visit (HOSPITAL_COMMUNITY)
Admission: RE | Admit: 2022-02-12 | Discharge: 2022-02-12 | Disposition: A | Discharge status: Home / Self Care | Payer: 59 | Source: Ambulatory Visit | Attending: Hematology and Oncology | Admitting: Hematology and Oncology

## 2022-02-12 DIAGNOSIS — E559 Vitamin D deficiency, unspecified: Secondary | ICD-10-CM | POA: Diagnosis not present

## 2022-02-12 DIAGNOSIS — D7389 Other diseases of spleen: Secondary | ICD-10-CM | POA: Diagnosis not present

## 2022-02-12 DIAGNOSIS — T451X5A Adverse effect of antineoplastic and immunosuppressive drugs, initial encounter: Secondary | ICD-10-CM | POA: Diagnosis not present

## 2022-02-12 DIAGNOSIS — G620 Drug-induced polyneuropathy: Secondary | ICD-10-CM | POA: Diagnosis not present

## 2022-02-12 DIAGNOSIS — C569 Malignant neoplasm of unspecified ovary: Secondary | ICD-10-CM

## 2022-02-12 DIAGNOSIS — F3289 Other specified depressive episodes: Secondary | ICD-10-CM | POA: Diagnosis not present

## 2022-02-12 DIAGNOSIS — K760 Fatty (change of) liver, not elsewhere classified: Secondary | ICD-10-CM | POA: Diagnosis not present

## 2022-02-12 DIAGNOSIS — R7303 Prediabetes: Secondary | ICD-10-CM | POA: Diagnosis not present

## 2022-02-12 LAB — COMPREHENSIVE METABOLIC PANEL
ALT: 14 U/L (ref 0–44)
AST: 18 U/L (ref 15–41)
Albumin: 4.3 g/dL (ref 3.5–5.0)
Alkaline Phosphatase: 88 U/L (ref 38–126)
Anion gap: 7 (ref 5–15)
BUN: 20 mg/dL (ref 8–23)
CO2: 29 mmol/L (ref 22–32)
Calcium: 9.5 mg/dL (ref 8.9–10.3)
Chloride: 102 mmol/L (ref 98–111)
Creatinine, Ser: 1.08 mg/dL — ABNORMAL HIGH (ref 0.44–1.00)
GFR, Estimated: 57 mL/min — ABNORMAL LOW (ref >60)
Glucose, Bld: 93 mg/dL (ref 70–99)
Potassium: 3.8 mmol/L (ref 3.5–5.1)
Sodium: 138 mmol/L (ref 135–145)
Total Bilirubin: 0.4 mg/dL (ref 0.3–1.2)
Total Protein: 7.2 g/dL (ref 6.5–8.1)

## 2022-02-12 LAB — CBC WITH DIFFERENTIAL/PLATELET
Abs Immature Granulocytes: 0.01 10*3/uL (ref 0.00–0.07)
Basophils Absolute: 0.1 10*3/uL (ref 0.0–0.1)
Basophils Relative: 1 %
Eosinophils Absolute: 0.2 10*3/uL (ref 0.0–0.5)
Eosinophils Relative: 3 %
HCT: 40 % (ref 36.0–46.0)
Hemoglobin: 13.8 g/dL (ref 12.0–15.0)
Immature Granulocytes: 0 %
Lymphocytes Relative: 20 %
Lymphs Abs: 1.5 10*3/uL (ref 0.7–4.0)
MCH: 30.1 pg (ref 26.0–34.0)
MCHC: 34.5 g/dL (ref 30.0–36.0)
MCV: 87.3 fL (ref 80.0–100.0)
Monocytes Absolute: 0.5 10*3/uL (ref 0.1–1.0)
Monocytes Relative: 7 %
Neutro Abs: 5.2 10*3/uL (ref 1.7–7.7)
Neutrophils Relative %: 69 %
Platelets: 289 10*3/uL (ref 150–400)
RBC: 4.58 MIL/uL (ref 3.87–5.11)
RDW: 12.9 % (ref 11.5–15.5)
WBC: 7.5 10*3/uL (ref 4.0–10.5)
nRBC: 0 % (ref 0.0–0.2)

## 2022-02-12 MED ORDER — IOHEXOL 9 MG/ML PO SOLN
1000.0000 mL | Freq: Once | ORAL | Status: AC
  Administered 2022-02-12: 1000 mL via ORAL

## 2022-02-12 MED ORDER — IOHEXOL 300 MG/ML  SOLN
100.0000 mL | Freq: Once | INTRAMUSCULAR | Status: AC | PRN
  Administered 2022-02-12: 100 mL via INTRAVENOUS

## 2022-02-13 ENCOUNTER — Other Ambulatory Visit (HOSPITAL_COMMUNITY): Payer: Self-pay

## 2022-02-13 LAB — HEMOGLOBIN A1C
Est. average glucose Bld gHb Est-mCnc: 111 mg/dL
Hgb A1c MFr Bld: 5.5 % (ref 4.8–5.6)

## 2022-02-13 LAB — VITAMIN B12: Vitamin B-12: 448 pg/mL (ref 232–1245)

## 2022-02-13 LAB — T4, FREE: Free T4: 1.15 ng/dL (ref 0.82–1.77)

## 2022-02-13 LAB — TSH: TSH: 0.836 u[IU]/mL (ref 0.450–4.500)

## 2022-02-13 LAB — VITAMIN D 25 HYDROXY (VIT D DEFICIENCY, FRACTURES): Vit D, 25-Hydroxy: 81.7 ng/mL (ref 30.0–100.0)

## 2022-02-13 LAB — INSULIN, RANDOM: INSULIN: 17.4 u[IU]/mL (ref 2.6–24.9)

## 2022-02-14 ENCOUNTER — Inpatient Hospital Stay (HOSPITAL_BASED_OUTPATIENT_CLINIC_OR_DEPARTMENT_OTHER): Payer: 59 | Admitting: Hematology and Oncology

## 2022-02-14 ENCOUNTER — Encounter: Payer: Self-pay | Admitting: Hematology and Oncology

## 2022-02-14 ENCOUNTER — Inpatient Hospital Stay: Payer: 59

## 2022-02-14 ENCOUNTER — Other Ambulatory Visit (INDEPENDENT_AMBULATORY_CARE_PROVIDER_SITE_OTHER): Payer: Self-pay | Admitting: Family Medicine

## 2022-02-14 VITALS — BP 147/60 | HR 68 | Temp 98.6°F | Resp 18 | Ht 66.0 in | Wt 191.2 lb

## 2022-02-14 DIAGNOSIS — R300 Dysuria: Secondary | ICD-10-CM

## 2022-02-14 DIAGNOSIS — N183 Chronic kidney disease, stage 3 unspecified: Secondary | ICD-10-CM | POA: Diagnosis not present

## 2022-02-14 DIAGNOSIS — C569 Malignant neoplasm of unspecified ovary: Secondary | ICD-10-CM

## 2022-02-14 DIAGNOSIS — C786 Secondary malignant neoplasm of retroperitoneum and peritoneum: Secondary | ICD-10-CM | POA: Diagnosis not present

## 2022-02-14 DIAGNOSIS — E559 Vitamin D deficiency, unspecified: Secondary | ICD-10-CM

## 2022-02-14 LAB — URINALYSIS, COMPLETE (UACMP) WITH MICROSCOPIC
Bacteria, UA: NONE SEEN
Bilirubin Urine: NEGATIVE
Glucose, UA: NEGATIVE mg/dL
Ketones, ur: NEGATIVE mg/dL
Nitrite: NEGATIVE
Protein, ur: NEGATIVE mg/dL
Specific Gravity, Urine: 1.006 (ref 1.005–1.030)
WBC, UA: >50 WBC/hpf — ABNORMAL HIGH (ref 0–5)
pH: 6 (ref 5.0–8.0)

## 2022-02-14 MED ORDER — VITAMIN D (ERGOCALCIFEROL) 1.25 MG (50000 UNIT) PO CAPS: ORAL_CAPSULE | ORAL | 0 refills | Status: DC

## 2022-02-14 NOTE — Assessment & Plan Note (Signed)
She has frequent urination and urinary incontinence I recommend checking for urinalysis and urine culture to rule out infection

## 2022-02-14 NOTE — Assessment & Plan Note (Signed)
Her renal function is stable Observe closely

## 2022-02-14 NOTE — Assessment & Plan Note (Signed)
I have reviewed multiple CT imaging with the patient Overall, the size of measurable disease are stable We will continue current treatment with Lupron every month as well as oral antiestrogen therapy Plan to repeat imaging study in 4 months, due next in February 2024

## 2022-02-14 NOTE — Progress Notes (Signed)
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[facility]) I have reviewed multiple CT imaging with the patient Overall, the size of measurable disease are stable We will continue current treatment with Lupron every month as well as oral antiestrogen therapy Plan to repeat imaging study in 4 months, due next in February 2024  Dysuria She has frequent urination and urinary incontinence I recommend checking for urinalysis and urine culture to rule out infection  CKD (chronic kidney disease), stage III Her renal function is stable Observe closely  Orders Placed This Encounter  Procedures   Urine Culture    Standing Status:   Future    Number of Occurrences:   1    Standing Expiration Date:   02/15/2023   Urinalysis, Complete w Microscopic    Standing Status:   Future    Number of Occurrences:   1    Standing Expiration Date:   02/15/2023    All questions were answered. The patient knows to call the clinic with any problems, questions or concerns. The total time spent in the appointment was 30 minutes encounter with patients including review of chart and various tests results, discussions about plan of care and coordination of care plan   Heath Lark, MD 02/14/2022 12:33 PM  INTERVAL HISTORY: Please see below for problem oriented charting. she returns for treatment follow-up and review of CT imaging results She tolerated treatment well Denies major hot flashes She has recent urinary frequency and urinary incontinence Denies fever or chills Denies nausea or changes in bowel habits  REVIEW OF SYSTEMS:   Constitutional: Denies fevers, chills or abnormal weight loss Eyes: Denies blurriness of vision Ears, nose, mouth, throat, and face: Denies mucositis or sore throat Respiratory: Denies  cough, dyspnea or wheezes Cardiovascular: Denies palpitation, chest discomfort or lower extremity swelling Gastrointestinal:  Denies nausea, heartburn or change in bowel habits Skin: Denies abnormal skin rashes Lymphatics: Denies new lymphadenopathy or easy bruising Neurological:Denies numbness, tingling or new weaknesses Behavioral/Psych: Mood is stable, no new changes  All other systems were reviewed with the patient and are negative.  I have reviewed the past medical history, past surgical history, social history and family history with the patient and they are unchanged from previous note.  ALLERGIES:  is allergic to codeine, thimerosal (thiomersal), ciprofloxacin, daypro [oxaprozin], levofloxacin, stadol [butorphanol tartrate], and sulfa antibiotics.  MEDICATIONS:  Current Outpatient Medications  Medication Sig Dispense Refill   acetaminophen (TYLENOL) 500 MG tablet Take 1,000 mg by mouth every 6 (six) hours as needed for moderate pain.     anastrozole (ARIMIDEX) 1 MG tablet Take 1 tablet (1 mg total) by mouth daily. 90 tablet 3   aspirin EC 81 MG tablet Take 81 mg by mouth daily. Swallow whole.     atorvastatin (LIPITOR) 40 MG tablet Take 1 tablet (40 mg total) by mouth daily. 90 tablet 3   Biotin 5 MG TABS Take 5 mg by mouth daily.     buPROPion (WELLBUTRIN SR) 200 MG 12 hr tablet Take 1 tablet (200 mg total) by mouth 2 (two) times daily. 180 tablet 0   Ca Phosphate-Cholecalciferol (EQL CALCIUM GUMMIES) 250-400 MG-UNIT CHEW Chew 1 tablet by mouth 2 (two) times daily.     escitalopram (LEXAPRO) 20 MG tablet Take 1 tablet (20 mg total) by mouth daily.  90 tablet 3   gabapentin (NEURONTIN) 300 MG capsule Take 1 capsule (300 mg total) by mouth 2 (two) times daily. 60 capsule 3   isosorbide mononitrate (IMDUR) 30 MG 24 hr tablet Take 1 tablet (30 mg total) by mouth daily. 90 tablet 3   leuprolide (LUPRON) 30 MG injection Inject 30 mg into the muscle every 30 (thirty) days.     metFORMIN  (GLUCOPHAGE) 500 MG tablet Take 1 tablet (500 mg total) by mouth every morning AND 2 tablets (1,000 mg total) every evening. Needs office visit. 270 tablet 0   mirabegron ER (MYRBETRIQ) 50 MG TB24 tablet Take 1 tablet (50 mg total) by mouth daily. 30 tablet 11   nitroGLYCERIN (NITROSTAT) 0.4 MG SL tablet Place 1 tablet (0.4 mg total) under the tongue every 5 (five) minutes as needed for chest pain. 25 tablet 3   omeprazole (PRILOSEC) 20 MG capsule Take 1 capsule by mouth once a day 90 capsule 3   Polyethyl Glycol-Propyl Glycol (SYSTANE OP) Place 1 drop into both eyes daily as needed (dry eyes).     Prenatal MV & Min w/FA-DHA (PRENATAL GUMMIES) 0.18-25 MG CHEW Chew 1 capsule by mouth daily.     Probiotic Product (ALIGN PO) Take 1 tablet by mouth daily.      Vitamin D, Ergocalciferol, (DRISDOL) 1.25 MG (50000 UNIT) CAPS capsule Take 1 capsule (50,000 Units total) by mouth every 7 (seven) days. (Need Office Visit) 12 capsule 0   No current facility-administered medications for this visit.    SUMMARY OF ONCOLOGIC HISTORY: Oncology History Overview Note  2018 - Core biopsy for ER/PR and Foundation One testing performed. Unfortunately, no sufficient tissue for Foundation One testing. ER was 50% and PR 90%. Progressed on carboplatin, exemestane, Avastin,Tamoxifen/Megace and letrozole, mixed response on Lupron  AMH: 06/23/19: 108 05/26/19: 9.04 01/05/19: 6.84 09/02/18: 3.72 06/01/18: 3.84 02/24/18: 2.6 11/21/17: 3.26 08/26/17: 1.77 05/27/17: 2.12 01/28/17: 2.47 06/10/16: 2.68 02/06/16: 1.84 05/12/15: 3.27 10/03/14: 2.12  Inhibin B 09/22/2019: 95.9 05/26/19: 90.2 01/05/19: 92 09/02/18: 70.9 06/04/18: 70 02/24/18: 79.5 11/21/17: 85.8 08/26/17: 65.6 05/27/17: 58.9 01/28/17: 198 06/10/16: 118.1 02/06/16: 62.4 05/12/15: 64.6 10/03/14: 58   Malignant granulosa cell tumor of ovary ([place])  1994 Initial Diagnosis   1994   2002 Relapse/Recurrence   Upper abdominal recurrence, resected.     - 09/2000 Chemotherapy    6 cycles of IP cisplatin and etoposide    02/2009 Relapse/Recurrence   CT showed increased size of nodules in pelvis   2010 Surgery   Exlap with section of tumor nodules near cecum and left pelvic sidewall. Tumor: ER negative, PR positive    Treatment Plan Change   Alternated 2 week courses of Megace and Tamoxifen - ended 03/2012   03/2012 PET scan   CT - progressive disease   2013 Treatment Plan Change   Letrozole   01/2016 Imaging   MRI showed progressive disease   2017 Treatment Plan Change   Two weeks of alternating Tamoxifen 22m daily and then Megace 426mTID   08/2016 Imaging   MRI showed progressive disease with peritoneal implants near liver, in pelvis   01/2017 Treatment Plan Change   Lupron 11.25 q 3 months   11/2017 Imaging   Overall mixed response.   Mixed cystic/solid lesions in the left pelvis are mildly improved.   Cystic peritoneal disease, including the dominant lesion along the posterior right hepatic lobe, is mildly progressed.   Subcapsular lesion along the posterior right hepatic lobe is unchanged.  03/2018 Imaging   MRI A/P: Mixed response of individual peritoneal metastases in the pelvis, as described above. Overall, there has been no significant change in bulk of disease.   Stable cystic peritoneal metastatic disease along the capsular surfaces of the liver and spleen.   No new sites of metastatic disease identified within the abdomen or pelvis.   08/2018 Imaging   MRI A/P: Status post hysterectomy and bilateral salpingo-oophorectomy.   Mixed cystic/solid peritoneal implants in the abdomen/pelvis, as above. Dominant cystic implant along the posterior liver surface is mildly increased. Remaining lesions are overall grossly unchanged.   No new lesions are identified.   01/28/2019 Imaging   Mri A/P: 1. Relatively similar appearance of peritoneal metastasis. A posterior right hepatic capsular based lesion is similar to minimally decreased in  size. Left pelvic implants are primarily similar with possible enlargement of an anterior high left pelvic cystic implant. No new disease identified. 2.  Aortic Atherosclerosis (ICD10-I70.0). 3. Hepatic steatosis. 4. Left adrenal adenoma.   06/02/2019 Imaging   MRI 1. Potential slight enlargement of dominant cystic area and solid component, associated with rind like signal variation on T2 along the inferior right hepatic margin, also potentially slightly increased. Findings may still be within the realm of measurement and technical variation. Close attention on follow-up. 2. Subtle cystic changes along the cephalad margin of the spleen are difficult to see on previous imaging, perhaps new compared with prior imaging studies. 3. Pelvic implants and left lower quadrant lesion with similar size, of the area along the left iliac vasculature may be slightly larger than on the prior study. 4. Signs of extensive retroperitoneal and pelvic lymphadenectomy. 5. Hepatic steatosis. 6. 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     06/08/2019 Cancer Staging   Staging form: [place], AJCC 7th Edition - Clinical: Stage IIIC (rT2, N1, M0) - Signed by Heath Lark, MD on 06/08/2019   06/10/2019 Imaging   1. 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 These findings are slightly worsened in comparison to a remote prior CT examination dated 10/04/2014.   2.  No evidence of metastatic disease in the chest.   3. Status post hysterectomy, pelvic and retroperitoneal lymph node dissection, and ventral hernia mesh repair.   4.  Hepatic steatosis.   5.  Aortic Atherosclerosis (ICD10-I70.0).   06/24/2019 - 09/02/2019 Chemotherapy   The patient had carboplatin for chemotherapy treatment.      09/02/2019 Tumor Marker   Patient's tumor was tested for the following markers: Inhibin B Results of the tumor marker test revealed 94   09/23/2019 Imaging   1. Interval progression of the soft tissue lesions in the anterior left pelvis, likely peritoneal implants, compatible with disease progression. Remaining sites of apparent disease along the liver capsule and posterior spleen are stable 2. Stable 2 cm left adrenal adenoma. 3. Hepatic steatosis. 4. Right-side predominant colonic diverticulosis without diverticulitis. 5. Aortic Atherosclerosis (ICD10-I70.0).   12/23/2019 Imaging   1. Slight interval increase in size of a mixed solid and cystic nodule in the left hemipelvis measuring 3.0 x 2.9 cm, previously 2.7 x 2.1 cm. 2. Interval decrease in size of a nodule in the left paracolic gutter measuring 1.0 x 0.8 cm, previously 2.1 x 1.6 cm. 3. Stable peritoneal nodules in the hepatorenal recess and  at the inferior tip of the spleen. 4. Unchanged nodule or lymph node overlying the left external iliac artery. 5. Enlargement of dominant left pelvic nodule is concerning for disease progression despite interval decrease in size of a nodule in the left paracolic gutter and stability of other nodules. 6. No evidence of metastatic disease in the chest. 7. Stable, benign left adrenal adenoma. 8. Ventral hernia mesh repair with a small component of recurrent hernia inferiorly, containing a single nonobstructed loop of small bowel. 9. Hepatic steatosis. 10. Aortic Atherosclerosis (ICD10-I70.0).   12/23/2019 Tumor Marker   Patient's tumor was tested for the following markers: Inhibin B Results of the tumor marker test revealed 94.9   01/25/2020 Tumor Marker   Patient's tumor was tested for the following markers: Inhibin B Results of the tumor marker test revealed 116.7   02/20/2020 Genetic Testing   Negative genetic testing: no pathogenic variants detected in [facility] Multi-Cancer Panel.  Variant of  uncertain significance in HOXB13 at c.649C>T (p.Arg217Cys).  The report date is February 20, 2020.   The Multi-Cancer Panel offered by [facility] includes sequencing and/or deletion duplication testing of the following 85 genes: AIP, ALK, APC, ATM, AXIN2,BAP1,  BARD1, BLM, BMPR1A, BRCA1, BRCA2, BRIP1, CASR, CDC73, CDH1, CDK4, CDKN1B, CDKN1C, CDKN2A (p14ARF), CDKN2A (p16INK4a), CEBPA, CHEK2, CTNNA1, DICER1, DIS3L2, EGFR (c.2369C>T, p.Thr790Met variant only), EPCAM (Deletion/duplication testing only), FH, FLCN, GATA2, GPC3, GREM1 (Promoter region deletion/duplication testing only), HOXB13 (c.251G>A, p.Gly84Glu), HRAS, KIT, MAX, MEN1, MET, MITF (c.952G>A, p.Glu318Lys variant only), MLH1, MSH2, MSH3, MSH6, MUTYH, NBN, NF1, NF2, NTHL1, PALB2, PDGFRA, PHOX2B, PMS2, POLD1, POLE, POT1, PRKAR1A, PTCH1, PTEN, RAD50, RAD51C, RAD51D, RB1, RECQL4, RET, RNF43, RUNX1, SDHAF2, SDHA (sequence changes only), SDHB, SDHC, SDHD, SMAD4, SMARCA4, SMARCB1, SMARCE1, STK11, SUFU, TERC, TERT, TMEM127, TP53, TSC1, TSC2, VHL, WRN and WT1.    03/02/2020 Tumor Marker   Patient's tumor was tested for the following markers: Inhibin B Results of the tumor marker test revealed 80.8   04/17/2020 Imaging   1. Overall, exam is stable. Multiple peritoneal nodules are again seen. The index nodule in the left lower quadrant is mildly increased in size in the interval. The lesion within the anterior left pelvis has decreased in size in the interval. There has also been decrease in size of cystic lesion within the a hepatorenal recess. The remaining peritoneal lesions are unchanged. No new lesions identified. 2. Stable left adrenal nodule. 3.  Aortic Atherosclerosis (ICD10-I70.0).     08/21/2020 Imaging   Bone density is normal AP spine T score 0.8 Femoral neck on the left, T score -0.2 Femoral neck on the right ,T score -0.4   10/17/2020 Imaging   1. Interval progression of peritoneal nodules along the left pelvic sidewall, concerning for  progressive metastatic disease. 2. Small capsular lesion medial right liver and the apparent cystic lesion superior to the right kidney are similar to prior. 3. Tiny soft tissue nodule anterior aspect of the lateral left pelvis described as decreasing on the prior study has decreased further on today's exam. 4. Stable left adrenal nodule. This cannot be definitively characterized. 5. Tiny nonobstructing stone lower pole right kidney. 6. Aortic Atherosclerosis (ICD10-I70.0).   10/26/2020 Procedure   Successful placement of a right internal jugular approach power injectable Port-A-Cath. The catheter is ready for immediate use.       10/31/2020 - 10/04/2021 Chemotherapy   Patient is on Treatment Plan : OVARIAN Paclitaxel I4,5,80,99 q28d     01/25/2021 Imaging   1. Signs of peritoneal disease with  scattered peritoneal nodules. The index lesions are either stable or decreased in size in the interval as detailed above. No new sites of disease. 2. Ventral pelvic wall hernia contains a nonobstructed loop of small bowel. 3. Aortic Atherosclerosis (ICD10-I70.0).     06/14/2021 Imaging   1. Multiple peritoneal metastatic nodules are slightly diminished in size. 2. A left external iliac lymph node or nodule is however stable to slightly enlarged. 3. Overall findings are most consistent with continued treatment response of metastatic disease. 4. No evidence of metastatic disease within the chest. 5. Hepatic steatosis.     06/14/2021 Imaging   Right: Findings consistent with 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   Left: No evidence of thrombosis in the subclavian.   06/20/2021 Procedure   Successful right IJ vein Port-A-Cath explant.     10/18/2021 Imaging   1. Similar to mild interval increase in size of left external iliac lymph node. 2. Multiple peritoneal metastatic nodules are grossly unchanged in size when compared to prior  exam   02/14/2022 Imaging   1. Enlarging lymph node along the LEFT iliac chain, compatible with mild worsening of disease in this area. Stable stigmata of disease in the upper abdomen. 2. Post LEFT retroperitoneal and pelvic lymphadenectomy. 3. Stable LEFT adrenal adenoma. 4. Stable 4 mm pulmonary nodule in the LEFT lung base. 5. Aortic atherosclerosis. 6. Mild hepatic steatosis. 7. Post abdominal wall reconstruction with hernia below the mesh as on previous imaging.       PHYSICAL EXAMINATION: ECOG PERFORMANCE STATUS: 1 - Symptomatic but completely ambulatory  Vitals:   02/14/22 0922  BP: (!) 147/60  Pulse: 68  Resp: 18  Temp: 98.6 F (37 C)  SpO2: 100%   Filed Weights   02/14/22 0922  Weight: 191 lb 3.2 oz (86.7 kg)    GENERAL:alert, no distress and comfortable NEURO: alert & oriented x 3 with fluent speech, no focal motor/sensory deficits  LABORATORY DATA:  I have reviewed the data as listed    Component Value Date/Time   NA 138 02/12/2022 0824   NA 140 07/09/2021 0813   NA 141 09/24/2013 1131   K 3.8 02/12/2022 0824   K 4.5 09/24/2013 1131   CL 102 02/12/2022 0824   CO2 29 02/12/2022 0824   CO2 24 09/24/2013 1131   GLUCOSE 93 02/12/2022 0824   GLUCOSE 90 09/24/2013 1131   BUN 20 02/12/2022 0824   BUN 20 07/09/2021 0813   BUN 21.2 09/24/2013 1131   CREATININE 1.08 (H) 02/12/2022 0824   CREATININE 1.19 (H) 06/20/2021 1513   CREATININE 1.1 09/24/2013 1131   CALCIUM 9.5 02/12/2022 0824   CALCIUM 9.5 09/24/2013 1131   PROT 7.2 02/12/2022 0824   PROT 6.7 11/02/2021 0000   PROT 6.8 09/24/2013 1131   ALBUMIN 4.3 02/12/2022 0824   ALBUMIN 4.4 11/02/2021 0000   ALBUMIN 3.9 09/24/2013 1131   AST 18 02/12/2022 0824   AST 21 06/20/2021 1513   AST 23 09/24/2013 1131   ALT 14 02/12/2022 0824   ALT 19 06/20/2021 1513   ALT 27 09/24/2013 1131   ALKPHOS 88 02/12/2022 0824   ALKPHOS 81 09/24/2013 1131   BILITOT 0.4 02/12/2022 0824   BILITOT 0.4 11/02/2021 0000    BILITOT 0.4 06/20/2021 1513   BILITOT 0.53 09/24/2013 1131   GFRNONAA 57 (L) 02/12/2022 0824   GFRNONAA 51 (L) 06/20/2021 1513   GFRAA 55 (L) 01/24/2020 1530   GFRAA  57 (L) 12/21/2019 1259    No results found for: "SPEP", "UPEP"  Lab Results  Component Value Date   WBC 7.5 02/12/2022   NEUTROABS 5.2 02/12/2022   HGB 13.8 02/12/2022   HCT 40.0 02/12/2022   MCV 87.3 02/12/2022   PLT 289 02/12/2022      Chemistry      Component Value Date/Time   NA 138 02/12/2022 0824   NA 140 07/09/2021 0813   NA 141 09/24/2013 1131   K 3.8 02/12/2022 0824   K 4.5 09/24/2013 1131   CL 102 02/12/2022 0824   CO2 29 02/12/2022 0824   CO2 24 09/24/2013 1131   BUN 20 02/12/2022 0824   BUN 20 07/09/2021 0813   BUN 21.2 09/24/2013 1131   CREATININE 1.08 (H) 02/12/2022 0824   CREATININE 1.19 (H) 06/20/2021 1513   CREATININE 1.1 09/24/2013 1131      Component Value Date/Time   CALCIUM 9.5 02/12/2022 0824   CALCIUM 9.5 09/24/2013 1131   ALKPHOS 88 02/12/2022 0824   ALKPHOS 81 09/24/2013 1131   AST 18 02/12/2022 0824   AST 21 06/20/2021 1513   AST 23 09/24/2013 1131   ALT 14 02/12/2022 0824   ALT 19 06/20/2021 1513   ALT 27 09/24/2013 1131   BILITOT 0.4 02/12/2022 0824   BILITOT 0.4 11/02/2021 0000   BILITOT 0.4 06/20/2021 1513   BILITOT 0.53 09/24/2013 1131       RADIOGRAPHIC STUDIES: I have reviewed multiple imaging studies with the patient I have personally reviewed the radiological images as listed and agreed with the findings in the report. CT ABDOMEN PELVIS W CONTRAST  Result Date: 02/14/2022 CLINICAL DATA:  History of ovarian cancer, assess treatment response. * Tracking Code: BO * EXAM: CT ABDOMEN AND PELVIS WITH CONTRAST TECHNIQUE: Multidetector CT imaging of the abdomen and pelvis was performed using the standard protocol following bolus administration of intravenous contrast. RADIATION DOSE REDUCTION: This exam was performed according to the departmental  dose-optimization program which includes automated exposure control, adjustment of the mA and/or kV according to patient size and/or use of iterative reconstruction technique. CONTRAST:  186m OMNIPAQUE IOHEXOL 300 MG/ML  SOLN COMPARISON:  October 16, 2021 FINDINGS: Lower chest: Stable 4 mm pulmonary nodule in the LEFT lung base. Hepatobiliary: No focal, suspicious hepatic lesion. No pericholecystic stranding. No biliary duct dilation. Portal vein is patent. Mild hepatic steatosis. Pancreas: Normal, without mass, inflammation or ductal dilatation. Spleen: Small cystic capsular lesion along the margin of the spleen is stable at approximately 14 mm. Adrenals/Urinary Tract: 2 cm well-circumscribed LEFT adrenal lesion is stable previously characterized as an adrenal adenoma. Normal RIGHT adrenal gland. Symmetric renal enhancement without hydronephrosis. Benign renal cysts seen bilaterally are unchanged no perivesical stranding. Stomach/Bowel: No stranding adjacent to the stomach. No sign of small bowel obstruction or inflammation. Knuckle of small bowel herniated through the anterior abdominal wall below the umbilicus without signs of obstruction, also unchanged. Appendix not visualized. No secondary signs to suggest acute process about the cecum. No pericolonic stranding. Vascular/Lymphatic: Calcified atheromatous plaque of the abdominal aorta without aneurysmal dilation. Smooth contour of the IVC. Post LEFT retroperitoneal and pelvic lymphadenectomy. Enlarging lymph node along the LEFT iliac chain (image 63/2) 14 mm previously 10 mm short axis. Reproductive: Post hysterectomy. Other: No ascites. Post ventral abdominal wall reconstruction with small hernia below the mesh as outlined above. Small nodule in the hepatic renal recess adjacent to cystic area along the margin of the RIGHT hemiliver. Nodule in the  patter renal recess measuring 8 mm greatest thickness is unchanged. Cystic area along the margin of the posterior  RIGHT hepatic lobe (image 27/2) 31 x 26 mm, stable as measured by this observer on the prior study. Capsular implant along the spleen 14 mm greatest axial dimension, stable. (Image 24/2) Musculoskeletal: No acute or significant osseous findings. IMPRESSION: 1. Enlarging lymph node along the LEFT iliac chain, compatible with mild worsening of disease in this area. Stable stigmata of disease in the upper abdomen. 2. Post LEFT retroperitoneal and pelvic lymphadenectomy. 3. Stable LEFT adrenal adenoma. 4. Stable 4 mm pulmonary nodule in the LEFT lung base. 5. Aortic atherosclerosis. 6. Mild hepatic steatosis. 7. Post abdominal wall reconstruction with hernia below the mesh as on previous imaging. Aortic Atherosclerosis (ICD10-I70.0). Electronically Signed   By: Zetta Bills M.D.   On: 02/14/2022 08:55

## 2022-02-15 ENCOUNTER — Other Ambulatory Visit: Payer: Self-pay

## 2022-02-15 ENCOUNTER — Telehealth: Payer: Self-pay

## 2022-02-15 ENCOUNTER — Inpatient Hospital Stay: Payer: 59 | Admitting: Hematology and Oncology

## 2022-02-15 ENCOUNTER — Inpatient Hospital Stay: Payer: 59

## 2022-02-15 DIAGNOSIS — R300 Dysuria: Secondary | ICD-10-CM

## 2022-02-15 LAB — URINE CULTURE

## 2022-02-15 NOTE — Telephone Encounter (Signed)
She called back and left a message. She will repeat UA on Monday. Lab appt added and orders entered.

## 2022-02-15 NOTE — Telephone Encounter (Signed)
Called and left below message. Ask her to call the office back. 

## 2022-02-15 NOTE — Telephone Encounter (Signed)
-----   Message from Heath Lark, MD sent at 02/15/2022 11:46 AM EDT ----- Urine culture suggest possible contamination so for now, not definitive for UTI Would she like to recollect or wait for urologist appt

## 2022-02-18 ENCOUNTER — Inpatient Hospital Stay: Payer: 59

## 2022-02-18 DIAGNOSIS — R300 Dysuria: Secondary | ICD-10-CM | POA: Diagnosis not present

## 2022-02-18 DIAGNOSIS — N183 Chronic kidney disease, stage 3 unspecified: Secondary | ICD-10-CM | POA: Diagnosis not present

## 2022-02-18 DIAGNOSIS — C569 Malignant neoplasm of unspecified ovary: Secondary | ICD-10-CM | POA: Diagnosis not present

## 2022-02-18 DIAGNOSIS — C786 Secondary malignant neoplasm of retroperitoneum and peritoneum: Secondary | ICD-10-CM | POA: Diagnosis not present

## 2022-02-18 LAB — URINALYSIS, COMPLETE (UACMP) WITH MICROSCOPIC
Bacteria, UA: NONE SEEN
Bilirubin Urine: NEGATIVE
Glucose, UA: NEGATIVE mg/dL
Hgb urine dipstick: NEGATIVE
Ketones, ur: NEGATIVE mg/dL
Leukocytes,Ua: NEGATIVE
Nitrite: NEGATIVE
Protein, ur: NEGATIVE mg/dL
Specific Gravity, Urine: 1.017 (ref 1.005–1.030)
pH: 5 (ref 5.0–8.0)

## 2022-02-19 ENCOUNTER — Telehealth: Payer: Self-pay

## 2022-02-19 LAB — URINE CULTURE: Culture: NO GROWTH

## 2022-02-19 NOTE — Telephone Encounter (Signed)
Called and given below message. She verbalized understanding. 

## 2022-02-19 NOTE — Telephone Encounter (Signed)
-----   Message from Heath Lark, MD sent at 02/19/2022  1:41 PM EDT ----- Pls let her know UA and UCx neg

## 2022-02-22 ENCOUNTER — Inpatient Hospital Stay: Payer: 59 | Attending: Hematology

## 2022-02-22 DIAGNOSIS — C569 Malignant neoplasm of unspecified ovary: Secondary | ICD-10-CM | POA: Insufficient documentation

## 2022-02-22 DIAGNOSIS — C786 Secondary malignant neoplasm of retroperitoneum and peritoneum: Secondary | ICD-10-CM | POA: Insufficient documentation

## 2022-02-25 DIAGNOSIS — H2513 Age-related nuclear cataract, bilateral: Secondary | ICD-10-CM | POA: Diagnosis not present

## 2022-02-25 DIAGNOSIS — H35373 Puckering of macula, bilateral: Secondary | ICD-10-CM | POA: Diagnosis not present

## 2022-02-25 DIAGNOSIS — H524 Presbyopia: Secondary | ICD-10-CM | POA: Diagnosis not present

## 2022-02-25 DIAGNOSIS — H04123 Dry eye syndrome of bilateral lacrimal glands: Secondary | ICD-10-CM | POA: Diagnosis not present

## 2022-02-25 NOTE — Progress Notes (Unsigned)
Chief Complaint:   OBESITY [name] is here to discuss her progress with her obesity treatment plan along with follow-up of her obesity related diagnoses. [name] is on practicing portion control and making smarter food choices, such as increasing vegetables and decreasing simple carbohydrates and states she is following her eating plan approximately 85-90% of the time. [name] states she is doing aerobics for 60 minutes 2 times per week, line dancing for 45-60 minutes 1 time per week, and walking for 60 minutes 2 times per week.  Today's visit was #: 30 Starting weight: 209 lbs Starting date: 04/29/2018 Today's weight: 183 lbs Today's date: 02/11/2022 Total lbs lost to date: 26 Total lbs lost since last in-office visit: 8  Interim History: Patient increased her exercise about twice as much and she is down 8 pounds.  She feels great and has more energy, and emotionally she is able to handle her stressors better.  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  She takes Neurontin once daily per her oncologist.  2. Prediabetes [name] is tolerating medication(s) well without side effects.  Medication compliance is good as patient endorses taking it as prescribed.  The patient denies additional concerns regarding this condition.      3. Vitamin D deficiency [name] is tolerating medication(s) well without side effects.  Medication compliance is good as patient endorses taking it as prescribed.  The patient denies additional concerns regarding this condition.      4. Other depression, with emotional eating Patient is doing great with no concerns.  She denies emotional eating and she feels "really good".  Assessment/Plan:   Orders Placed This Encounter  Procedures   VITAMIN D 25 Hydroxy (Vit-D Deficiency, Fractures)   TSH   T4, free   Insulin, random   Hemoglobin A1c   Vitamin B12    Medications Discontinued During This  Encounter  Medication Reason   buPROPion (WELLBUTRIN SR) 200 MG 12 hr tablet Reorder   metFORMIN (GLUCOPHAGE) 500 MG tablet Reorder   Vitamin D, Ergocalciferol, (DRISDOL) 1.25 MG (50000 UNIT) CAPS capsule Reorder     Meds ordered this encounter  Medications   DISCONTD: Vitamin D, Ergocalciferol, (DRISDOL) 1.25 MG (50000 UNIT) CAPS capsule    Sig: Take 1 capsule (50,000 Units total) by mouth every 7 (seven) days. (Need Office Visit)    Dispense:  12 capsule    Refill:  0    No rf - needs OV   buPROPion (WELLBUTRIN SR) 200 MG 12 hr tablet    Sig: Take 1 tablet (200 mg total) by mouth 2 (two) times daily.    Dispense:  180 tablet    Refill:  0    90 d supply; ov for RF   metFORMIN (GLUCOPHAGE) 500 MG tablet    Sig: Take 1 tablet (500 mg total) by mouth every morning AND 2 tablets (1,000 mg total) every evening. Needs office visit.    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  Patient will continue her medications per her specialist as is, and she will continue with her exercise.  - Vitamin B12  2. Prediabetes Labs were preordered.  We will refill metformin for 90 days.  Patient will continue with her prudent nutritional plan, weight loss, and exercise.  -  Insulin, random - Hemoglobin A1c - metFORMIN (GLUCOPHAGE) 500 MG tablet; Take 1 tablet (500 mg total) by mouth every morning AND 2 tablets (1,000 mg total) every evening. Needs office visit.  Dispense: 270 tablet; Refill: 0  3. Vitamin D deficiency Labs were preordered. Low Vitamin D level contributes to fatigue and are associated with obesity, breast, and colon cancer.  Patient agreed to continue prescription Vitamin D 50,000 IU every week and for 90 days.  She will follow-up for routine testing of Vitamin D, at least 2-3 times per year to avoid over-replacement.  - VITAMIN D 25 Hydroxy (Vit-D Deficiency,  Fractures)  4. Other depression, with emotional eating Labs were preordered.  We will refill Wellbutrin SR for 90 days with no change in dose.  She will continue her prudent nutritional plan and exercise.  - TSH - T4, free - buPROPion (WELLBUTRIN SR) 200 MG 12 hr tablet; Take 1 tablet (200 mg total) by mouth 2 (two) times daily.  Dispense: 180 tablet; Refill: 0  5. Obesity with current BMI of 29.6 [name] is currently in the action stage of change. As such, her goal is to continue with weight loss efforts. She has agreed to practicing portion control and making smarter food choices, such as increasing vegetables and decreasing simple carbohydrates.   Patient will be having labs done tomorrow with her cancer doctor and she wishes to have our labs drawn at that time.  Exercise goals: As is.   Behavioral modification strategies: increasing lean protein intake and increasing water intake.  [name] has agreed to follow-up with our clinic in 3 months. She was informed of the importance of frequent follow-up visits to maximize her success with intensive lifestyle modifications for her multiple health conditions.   Objective:   Blood pressure 117/74, pulse 65, temperature 97.6 F (36.4 C), height '5\' 6"'$  (1.676 m), weight 183 lb (83 kg), SpO2 99 %. Body mass index is 29.54 kg/m.  General: Cooperative, alert, well developed, in no acute distress. HEENT: Conjunctivae and lids unremarkable. Cardiovascular: Regular rhythm.  Lungs: Normal work of breathing. Neurologic: No focal deficits.   Lab Results  Component Value Date   CREATININE 1.08 (H) 02/12/2022   BUN 20 02/12/2022   NA 138 02/12/2022   K 3.8 02/12/2022   CL 102 02/12/2022   CO2 29 02/12/2022   Lab Results  Component Value Date   ALT 14 02/12/2022   AST 18 02/12/2022   ALKPHOS 88 02/12/2022   BILITOT 0.4 02/12/2022   Lab Results  Component Value Date   HGBA1C 5.5 02/12/2022   HGBA1C 5.4 07/09/2021   HGBA1C 5.2 11/30/2019    HGBA1C 5.3 05/31/2019   HGBA1C 5.2 11/19/2018   Lab Results  Component Value Date   INSULIN 17.4 02/12/2022   INSULIN 17.1 07/09/2021   INSULIN 23.8 11/30/2019   INSULIN 16.5 05/31/2019   INSULIN 17.6 11/19/2018   Lab Results  Component Value Date   TSH 0.836 02/12/2022   Lab Results  Component Value Date   CHOL 255 (H) 07/09/2021   HDL 50 07/09/2021   LDLCALC 182 (H) 07/09/2021   TRIG 126 07/09/2021   Lab Results  Component Value Date   VD25OH 81.7 02/12/2022   VD25OH 38.6 07/09/2021   VD25OH 59.0 11/30/2019   Lab Results  Component Value Date   WBC 7.5 02/12/2022   HGB 13.8 02/12/2022   HCT 40.0 02/12/2022   MCV 87.3 02/12/2022   PLT 289 02/12/2022   No results found for: "  IRON", "TIBC", "FERRITIN"  Attestation Statements:   Reviewed by clinician on day of visit: allergies, medications, problem list, medical history, surgical history, family history, social history, and previous encounter notes.   Wilhemena Durie, am acting as transcriptionist for [employer], DO.  I have reviewed the above documentation for accuracy and completeness, and I agree with the above. Marjory Sneddon, D.O.  The [place] was signed into law in 2016 which includes the topic of electronic health records.  This provides immediate access to information in MyChart.  This includes consultation notes, operative notes, office notes, lab results and pathology reports.  If you have any questions about what you read please let us know at your next visit so we can discuss your concerns and take corrective action if need be.  We are right here with you.

## 2022-02-26 ENCOUNTER — Inpatient Hospital Stay: Payer: 59

## 2022-02-26 ENCOUNTER — Other Ambulatory Visit: Payer: Self-pay | Admitting: Hematology and Oncology

## 2022-02-26 VITALS — BP 116/54 | HR 60 | Temp 97.9°F

## 2022-02-26 DIAGNOSIS — C786 Secondary malignant neoplasm of retroperitoneum and peritoneum: Secondary | ICD-10-CM | POA: Diagnosis not present

## 2022-02-26 DIAGNOSIS — C569 Malignant neoplasm of unspecified ovary: Secondary | ICD-10-CM

## 2022-02-26 MED ORDER — LEUPROLIDE ACETATE 3.75 MG IM KIT
3.7500 mg | PACK | Freq: Once | INTRAMUSCULAR | Status: AC
  Administered 2022-02-26: 3.75 mg via INTRAMUSCULAR
  Filled 2022-02-26: qty 3.75

## 2022-02-26 NOTE — Progress Notes (Signed)
She called and recently missed injection appt. Added injection for today at 1 pm. No labs needed per Dr. Alvy Bimler needed. [name] is aware of injection appt time.

## 2022-02-27 ENCOUNTER — Encounter: Payer: Self-pay | Admitting: Hematology and Oncology

## 2022-03-04 DIAGNOSIS — G4733 Obstructive sleep apnea (adult) (pediatric): Secondary | ICD-10-CM | POA: Diagnosis not present

## 2022-03-05 DIAGNOSIS — Z1272 Encounter for screening for malignant neoplasm of vagina: Secondary | ICD-10-CM | POA: Diagnosis not present

## 2022-03-05 DIAGNOSIS — Z6830 Body mass index (BMI) 30.0-30.9, adult: Secondary | ICD-10-CM | POA: Diagnosis not present

## 2022-03-05 DIAGNOSIS — Z1231 Encounter for screening mammogram for malignant neoplasm of breast: Secondary | ICD-10-CM | POA: Diagnosis not present

## 2022-03-05 DIAGNOSIS — Z01419 Encounter for gynecological examination (general) (routine) without abnormal findings: Secondary | ICD-10-CM | POA: Diagnosis not present

## 2022-03-11 ENCOUNTER — Other Ambulatory Visit (HOSPITAL_COMMUNITY): Payer: Self-pay

## 2022-03-26 ENCOUNTER — Inpatient Hospital Stay (HOSPITAL_BASED_OUTPATIENT_CLINIC_OR_DEPARTMENT_OTHER): Payer: 59 | Admitting: Hematology and Oncology

## 2022-03-26 ENCOUNTER — Inpatient Hospital Stay: Payer: 59 | Attending: Hematology

## 2022-03-26 ENCOUNTER — Encounter: Payer: Self-pay | Admitting: Hematology and Oncology

## 2022-03-26 ENCOUNTER — Inpatient Hospital Stay: Payer: 59

## 2022-03-26 ENCOUNTER — Other Ambulatory Visit (HOSPITAL_COMMUNITY): Payer: Self-pay

## 2022-03-26 VITALS — BP 130/44 | HR 57 | Temp 98.1°F | Resp 18 | Ht 66.0 in | Wt 187.6 lb

## 2022-03-26 VITALS — BP 128/52 | HR 58 | Temp 98.2°F | Resp 18

## 2022-03-26 DIAGNOSIS — C786 Secondary malignant neoplasm of retroperitoneum and peritoneum: Secondary | ICD-10-CM | POA: Insufficient documentation

## 2022-03-26 DIAGNOSIS — K5909 Other constipation: Secondary | ICD-10-CM | POA: Insufficient documentation

## 2022-03-26 DIAGNOSIS — C569 Malignant neoplasm of unspecified ovary: Secondary | ICD-10-CM

## 2022-03-26 LAB — COMPREHENSIVE METABOLIC PANEL
ALT: 14 U/L (ref 0–44)
AST: 16 U/L (ref 15–41)
Albumin: 4.2 g/dL (ref 3.5–5.0)
Alkaline Phosphatase: 77 U/L (ref 38–126)
Anion gap: 6 (ref 5–15)
BUN: 29 mg/dL — ABNORMAL HIGH (ref 8–23)
CO2: 29 mmol/L (ref 22–32)
Calcium: 9.8 mg/dL (ref 8.9–10.3)
Chloride: 103 mmol/L (ref 98–111)
Creatinine, Ser: 0.99 mg/dL (ref 0.44–1.00)
GFR, Estimated: >60 mL/min (ref >60)
Glucose, Bld: 81 mg/dL (ref 70–99)
Potassium: 4.3 mmol/L (ref 3.5–5.1)
Sodium: 138 mmol/L (ref 135–145)
Total Bilirubin: 0.4 mg/dL (ref 0.3–1.2)
Total Protein: 6.9 g/dL (ref 6.5–8.1)

## 2022-03-26 LAB — CBC WITH DIFFERENTIAL/PLATELET
Abs Immature Granulocytes: 0.01 10*3/uL (ref 0.00–0.07)
Basophils Absolute: 0.1 10*3/uL (ref 0.0–0.1)
Basophils Relative: 1 %
Eosinophils Absolute: 0.2 10*3/uL (ref 0.0–0.5)
Eosinophils Relative: 3 %
HCT: 40.3 % (ref 36.0–46.0)
Hemoglobin: 13.6 g/dL (ref 12.0–15.0)
Immature Granulocytes: 0 %
Lymphocytes Relative: 20 %
Lymphs Abs: 1.4 10*3/uL (ref 0.7–4.0)
MCH: 30.2 pg (ref 26.0–34.0)
MCHC: 33.7 g/dL (ref 30.0–36.0)
MCV: 89.4 fL (ref 80.0–100.0)
Monocytes Absolute: 0.7 10*3/uL (ref 0.1–1.0)
Monocytes Relative: 10 %
Neutro Abs: 4.8 10*3/uL (ref 1.7–7.7)
Neutrophils Relative %: 66 %
Platelets: 312 10*3/uL (ref 150–400)
RBC: 4.51 MIL/uL (ref 3.87–5.11)
RDW: 12.4 % (ref 11.5–15.5)
WBC: 7.3 10*3/uL (ref 4.0–10.5)
nRBC: 0 % (ref 0.0–0.2)

## 2022-03-26 MED ORDER — ANASTROZOLE 1 MG PO TABS
1.0000 mg | ORAL_TABLET | Freq: Every day | ORAL | 3 refills | Status: DC
  Filled 2022-03-26: qty 90, 90d supply, fill #0

## 2022-03-26 MED ORDER — LEUPROLIDE ACETATE 3.75 MG IM KIT
3.7500 mg | PACK | Freq: Once | INTRAMUSCULAR | Status: AC
  Administered 2022-03-26: 3.75 mg via INTRAMUSCULAR
  Filled 2022-03-26: qty 3.75

## 2022-03-26 NOTE — Assessment & Plan Note (Signed)
We discussed importance of regular laxatives She had negative colonoscopy evaluation recently

## 2022-03-26 NOTE — Assessment & Plan Note (Signed)
I have reviewed multiple CT imaging with the patient She is prone to get chronic constipation We will continue current treatment with Lupron every month as well as oral antiestrogen therapy Plan to repeat imaging study in a few months, due next in February 2024

## 2022-03-26 NOTE — Progress Notes (Signed)
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   All questions were answered. The patient knows to call the clinic with any problems, questions or concerns. The total time spent in the appointment was 20 minutes encounter with patients including review of chart and various tests results, discussions about plan of care and coordination of care plan   Heath Lark, MD 03/26/2022 10:42 AM  INTERVAL HISTORY: Please see below for problem oriented charting. she returns for treatment follow-up on Lupron and 11 Dex She has mild intermittent hot flashes but is not debilitating She had recent changes in her bowel habits and what she believes to be possible hemorrhoids Otherwise, she is quite asymptomatic and tolerated treatment fairly well  REVIEW OF SYSTEMS:   Constitutional: Denies fevers, chills or abnormal weight loss Eyes: Denies blurriness of vision Ears, nose, mouth, throat, and face: Denies mucositis or sore throat Respiratory: Denies cough, dyspnea or wheezes Cardiovascular: Denies palpitation, chest discomfort or lower extremity swelling Skin: Denies abnormal skin rashes Lymphatics: Denies new lymphadenopathy or easy bruising Neurological:Denies numbness, tingling or new weaknesses Behavioral/Psych: Mood is stable, no new changes  All other systems were  reviewed with the patient and are negative.  I have reviewed the past medical history, past surgical history, social history and family history with the patient and they are unchanged from previous note.  ALLERGIES:  is allergic to codeine, thimerosal (thiomersal), ciprofloxacin, daypro [oxaprozin], levofloxacin, stadol [butorphanol tartrate], and sulfa antibiotics.  MEDICATIONS:  Current Outpatient Medications  Medication Sig Dispense Refill   acetaminophen (TYLENOL) 500 MG tablet Take 1,000 mg by mouth every 6 (six) hours as needed for moderate pain.     anastrozole (ARIMIDEX) 1 MG tablet Take 1 tablet (1 mg total) by mouth daily. 90 tablet 3   aspirin EC 81 MG tablet Take 81 mg by mouth daily. Swallow whole.     atorvastatin (LIPITOR) 40 MG tablet Take 1 tablet (40 mg total) by mouth daily. 90 tablet 3   Biotin 5 MG TABS Take 5 mg by mouth daily.     buPROPion (WELLBUTRIN SR) 200 MG 12 hr tablet Take 1 tablet (200 mg total) by mouth 2 (two) times daily. 180 tablet 0   Ca Phosphate-Cholecalciferol (EQL CALCIUM GUMMIES) 250-400 MG-UNIT CHEW Chew 1 tablet by mouth 2 (two) times daily.     escitalopram (LEXAPRO) 20 MG tablet Take 1 tablet (20 mg total) by mouth daily. 90 tablet 3   gabapentin (NEURONTIN) 300 MG capsule Take 1 capsule (300 mg total) by mouth 2 (two) times daily. 60 capsule 3   isosorbide mononitrate (IMDUR) 30 MG 24 hr tablet Take 1 tablet (30 mg total) by mouth daily. 90 tablet 3   leuprolide (LUPRON) 30 MG injection Inject 30 mg into the muscle every 30 (thirty) days.     metFORMIN (GLUCOPHAGE) 500 MG tablet Take 1 tablet (500 mg total) by  mouth every morning AND 2 tablets (1,000 mg total) every evening. Needs office visit. 270 tablet 0   mirabegron ER (MYRBETRIQ) 50 MG TB24 tablet Take 1 tablet (50 mg total) by mouth daily. 30 tablet 11   nitroGLYCERIN (NITROSTAT) 0.4 MG SL tablet Place 1 tablet (0.4 mg total) under the tongue every 5 (five) minutes as needed for chest pain.  25 tablet 3   omeprazole (PRILOSEC) 20 MG capsule Take 1 capsule by mouth once a day 90 capsule 3   Polyethyl Glycol-Propyl Glycol (SYSTANE OP) Place 1 drop into both eyes daily as needed (dry eyes).     Prenatal MV & Min w/FA-DHA (PRENATAL GUMMIES) 0.18-25 MG CHEW Chew 1 capsule by mouth daily.     Probiotic Product (ALIGN PO) Take 1 tablet by mouth daily.      Vitamin D, Ergocalciferol, (DRISDOL) 1.25 MG (50000 UNIT) CAPS capsule 1 po q 10 days 9 capsule 0   No current facility-administered medications for this visit.    SUMMARY OF ONCOLOGIC HISTORY: Oncology History Overview Note  2018 - Core biopsy for ER/PR and Foundation One testing performed. Unfortunately, no sufficient tissue for Foundation One testing. ER was 50% and PR 90%. Progressed on carboplatin, exemestane, Avastin,Tamoxifen/Megace and letrozole, mixed response on Lupron  [facility]: 06/23/19: 108 05/26/19: 9.04 01/05/19: 6.84 09/02/18: 3.72 06/01/18: 3.84 02/24/18: 2.6 11/21/17: 3.26 08/26/17: 1.77 05/27/17: 2.12 01/28/17: 2.47 06/10/16: 2.68 02/06/16: 1.84 05/12/15: 3.27 10/03/14: 2.12  Inhibin B 09/22/2019: 95.9 05/26/19: 90.2 01/05/19: 92 09/02/18: 70.9 06/04/18: 70 02/24/18: 79.5 11/21/17: 85.8 08/26/17: 65.6 05/27/17: 58.9 01/28/17: 198 06/10/16: 118.1 02/06/16: 62.4 05/12/15: 64.6 10/03/14: 58   Malignant granulosa cell tumor of ovary ([place])  1994 Initial Diagnosis   1994   2002 Relapse/Recurrence   Upper abdominal recurrence, resected.     - 09/2000 Chemotherapy   6 cycles of IP cisplatin and etoposide    02/2009 Relapse/Recurrence   CT showed increased size of nodules in pelvis   2010 Surgery   Exlap with section of tumor nodules near cecum and left pelvic sidewall. Tumor: ER negative, PR positive    Treatment Plan Change   Alternated 2 week courses of Megace and Tamoxifen - ended 03/2012   03/2012 PET scan   CT - progressive disease   2013 Treatment Plan Change   Letrozole   01/2016 Imaging   MRI showed  progressive disease   2017 Treatment Plan Change   Two weeks of alternating Tamoxifen 62m daily and then Megace 487mTID   08/2016 Imaging   MRI showed progressive disease with peritoneal implants near liver, in pelvis   01/2017 Treatment Plan Change   Lupron 11.25 q 3 months   11/2017 Imaging   Overall mixed response.   Mixed cystic/solid lesions in the left pelvis are mildly improved.   Cystic peritoneal disease, including the dominant lesion along the posterior right hepatic lobe, is mildly progressed.   Subcapsular lesion along the posterior right hepatic lobe is unchanged.   03/2018 Imaging   MRI A/P: Mixed response of individual peritoneal metastases in the pelvis, as described above. Overall, there has been no significant change in bulk of disease.   Stable cystic peritoneal metastatic disease along the capsular surfaces of the liver and spleen.   No new sites of metastatic disease identified within the abdomen or pelvis.   08/2018 Imaging   MRI A/P: Status post hysterectomy and bilateral salpingo-oophorectomy.   Mixed cystic/solid peritoneal implants in the abdomen/pelvis, as above. Dominant cystic implant along  the posterior liver surface is mildly increased. Remaining lesions are overall grossly unchanged.   No new lesions are identified.   01/28/2019 Imaging   Mri A/P: 1. Relatively similar appearance of peritoneal metastasis. A posterior right hepatic capsular based lesion is similar to minimally decreased in size. Left pelvic implants are primarily similar with possible enlargement of an anterior high left pelvic cystic implant. No new disease identified. 2.  Aortic Atherosclerosis (ICD10-I70.0). 3. Hepatic steatosis. 4. Left adrenal adenoma.   06/02/2019 Imaging   MRI 1. Potential slight enlargement of dominant cystic area and solid component, associated with rind like signal variation on T2 along the inferior right hepatic margin, also potentially slightly  increased. Findings may still be within the realm of measurement and technical variation. Close attention on follow-up. 2. Subtle cystic changes along the cephalad margin of the spleen are difficult to see on previous imaging, perhaps new compared with prior imaging studies. 3. Pelvic implants and left lower quadrant lesion with similar size, of the area along the left iliac vasculature may be slightly larger than on the prior study. 4. Signs of extensive retroperitoneal and pelvic lymphadenectomy. 5. Hepatic steatosis. 6. 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     06/08/2019 Cancer Staging   Staging form: [place], AJCC 7th Edition - Clinical: Stage IIIC (rT2, N1, M0) - Signed by Heath Lark, MD on 06/08/2019   06/10/2019 Imaging   1. 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 These findings are slightly worsened in comparison to a remote prior CT examination dated 10/04/2014.   2.  No evidence of metastatic disease in the chest.   3. Status post hysterectomy, pelvic and retroperitoneal lymph node dissection, and ventral hernia mesh repair.   4.  Hepatic steatosis.   5.  Aortic Atherosclerosis (ICD10-I70.0).   06/24/2019 - 09/02/2019 Chemotherapy   The patient had carboplatin for chemotherapy treatment.     09/02/2019 Tumor Marker   Patient's tumor was tested for the following markers: Inhibin B Results of the tumor marker test revealed 94   09/23/2019 Imaging   1. Interval progression of the soft tissue lesions in the anterior left pelvis, likely peritoneal implants, compatible with disease progression. Remaining sites of apparent disease along the liver capsule and posterior spleen are stable 2. Stable 2 cm left adrenal adenoma. 3. Hepatic steatosis. 4.  Right-side predominant colonic diverticulosis without diverticulitis. 5. Aortic Atherosclerosis (ICD10-I70.0).   12/23/2019 Imaging   1. Slight interval increase in size of a mixed solid and cystic nodule in the left hemipelvis measuring 3.0 x 2.9 cm, previously 2.7 x 2.1 cm. 2. Interval decrease in size of a nodule in the left paracolic gutter measuring 1.0 x 0.8 cm, previously 2.1 x 1.6 cm. 3. Stable peritoneal nodules in the hepatorenal recess and at the inferior tip of the spleen. 4. Unchanged nodule or lymph node overlying the left external iliac artery. 5. Enlargement of dominant left pelvic nodule is concerning for disease progression despite interval decrease in size of a nodule in the left paracolic gutter and stability of other nodules. 6. No evidence of metastatic disease in the chest. 7. Stable, benign left adrenal adenoma. 8. Ventral hernia mesh repair with a small component of recurrent hernia inferiorly, containing a single nonobstructed loop of small bowel. 9. Hepatic steatosis.  10. Aortic Atherosclerosis (ICD10-I70.0).   12/23/2019 Tumor Marker   Patient's tumor was tested for the following markers: Inhibin B Results of the tumor marker test revealed 94.9   01/25/2020 Tumor Marker   Patient's tumor was tested for the following markers: Inhibin B Results of the tumor marker test revealed 116.7   02/20/2020 Genetic Testing   Negative genetic testing: no pathogenic variants detected in [facility] Multi-Cancer Panel.  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  The report date is February 20, 2020.   The Multi-Cancer Panel offered by [facility] includes sequencing and/or deletion duplication testing of the following 85 genes: AIP, ALK, APC, ATM, AXIN2,BAP1,  BARD1, BLM, BMPR1A, BRCA1, BRCA2, BRIP1, CASR, CDC73, CDH1, CDK4, CDKN1B, CDKN1C, CDKN2A (p14ARF), CDKN2A (p16INK4a), CEBPA, CHEK2, CTNNA1, DICER1, DIS3L2, EGFR (c.2369C>T, p.Thr790Met variant only), EPCAM  (Deletion/duplication testing only), FH, FLCN, GATA2, GPC3, GREM1 (Promoter region deletion/duplication testing only), HOXB13 (c.251G>A, p.Gly84Glu), HRAS, KIT, MAX, MEN1, MET, MITF (c.952G>A, p.Glu318Lys variant only), MLH1, MSH2, MSH3, MSH6, MUTYH, NBN, NF1, NF2, NTHL1, PALB2, PDGFRA, PHOX2B, PMS2, POLD1, POLE, POT1, PRKAR1A, PTCH1, PTEN, RAD50, RAD51C, RAD51D, RB1, RECQL4, RET, RNF43, RUNX1, SDHAF2, SDHA (sequence changes only), SDHB, SDHC, SDHD, SMAD4, SMARCA4, SMARCB1, SMARCE1, STK11, SUFU, TERC, TERT, TMEM127, TP53, TSC1, TSC2, VHL, WRN and WT1.    03/02/2020 Tumor Marker   Patient's tumor was tested for the following markers: Inhibin B Results of the tumor marker test revealed 80.8   04/17/2020 Imaging   1. Overall, exam is stable. Multiple peritoneal nodules are again seen. The index nodule in the left lower quadrant is mildly increased in size in the interval. The lesion within the anterior left pelvis has decreased in size in the interval. There has also been decrease in size of cystic lesion within the a hepatorenal recess. The remaining peritoneal lesions are unchanged. No new lesions identified. 2. Stable left adrenal nodule. 3.  Aortic Atherosclerosis (ICD10-I70.0).     08/21/2020 Imaging   Bone density is normal AP spine T score 0.8 Femoral neck on the left, T score -0.2 Femoral neck on the right ,T score -0.4   10/17/2020 Imaging   1. Interval progression of peritoneal nodules along the left pelvic sidewall, concerning for progressive metastatic disease. 2. Small capsular lesion medial right liver and the apparent cystic lesion superior to the right kidney are similar to prior. 3. Tiny soft tissue nodule anterior aspect of the lateral left pelvis described as decreasing on the prior study has decreased further on today's exam. 4. Stable left adrenal nodule. This cannot be definitively characterized. 5. Tiny nonobstructing stone lower pole right kidney. 6. Aortic Atherosclerosis  (ICD10-I70.0).   10/26/2020 Procedure   Successful placement of a right internal jugular approach power injectable Port-A-Cath. The catheter is ready for immediate use.       10/31/2020 - 10/04/2021 Chemotherapy   Patient is on Treatment Plan : OVARIAN Paclitaxel H7,0,26,37 q28d     01/25/2021 Imaging   1. Signs of peritoneal disease with scattered peritoneal nodules. The index lesions are either stable or decreased in size in the interval as detailed above. No new sites of disease. 2. Ventral pelvic wall hernia contains a nonobstructed loop of small bowel. 3. Aortic Atherosclerosis (ICD10-I70.0).     06/14/2021 Imaging   1. Multiple peritoneal metastatic nodules are slightly diminished in size. 2. A left external iliac lymph node or nodule is however stable to slightly enlarged. 3. Overall findings are most consistent with continued treatment response of metastatic disease. 4. No  evidence of metastatic disease within the chest. 5. Hepatic steatosis.     06/14/2021 Imaging   Right: Findings consistent with 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   Left: No evidence of thrombosis in the subclavian.   06/20/2021 Procedure   Successful right IJ vein Port-A-Cath explant.     10/18/2021 Imaging   1. Similar to mild interval increase in size of left external iliac lymph node. 2. Multiple peritoneal metastatic nodules are grossly unchanged in size when compared to prior exam   02/14/2022 Imaging   1. Enlarging lymph node along the LEFT iliac chain, compatible with mild worsening of disease in this area. Stable stigmata of disease in the upper abdomen. 2. Post LEFT retroperitoneal and pelvic lymphadenectomy. 3. Stable LEFT adrenal adenoma. 4. Stable 4 mm pulmonary nodule in the LEFT lung base. 5. Aortic atherosclerosis. 6. Mild hepatic steatosis. 7. Post abdominal wall reconstruction with hernia below the mesh as on  previous imaging.       PHYSICAL EXAMINATION: ECOG PERFORMANCE STATUS: 1 - Symptomatic but completely ambulatory  Vitals:   03/26/22 0932  BP: (!) 130/44  Pulse: (!) 57  Resp: 18  Temp: 98.1 F (36.7 C)  SpO2: 100%   Filed Weights   03/26/22 0932  Weight: 187 lb 9.6 oz (85.1 kg)    GENERAL:alert, no distress and comfortable  NEURO: alert & oriented x 3 with fluent speech, no focal motor/sensory deficits  LABORATORY DATA:  I have reviewed the data as listed    Component Value Date/Time   NA 138 03/26/2022 0831   NA 140 07/09/2021 0813   NA 141 09/24/2013 1131   K 4.3 03/26/2022 0831   K 4.5 09/24/2013 1131   CL 103 03/26/2022 0831   CO2 29 03/26/2022 0831   CO2 24 09/24/2013 1131   GLUCOSE 81 03/26/2022 0831   GLUCOSE 90 09/24/2013 1131   BUN 29 (H) 03/26/2022 0831   BUN 20 07/09/2021 0813   BUN 21.2 09/24/2013 1131   CREATININE 0.99 03/26/2022 0831   CREATININE 1.19 (H) 06/20/2021 1513   CREATININE 1.1 09/24/2013 1131   CALCIUM 9.8 03/26/2022 0831   CALCIUM 9.5 09/24/2013 1131   PROT 6.9 03/26/2022 0831   PROT 6.7 11/02/2021 0000   PROT 6.8 09/24/2013 1131   ALBUMIN 4.2 03/26/2022 0831   ALBUMIN 4.4 11/02/2021 0000   ALBUMIN 3.9 09/24/2013 1131   AST 16 03/26/2022 0831   AST 21 06/20/2021 1513   AST 23 09/24/2013 1131   ALT 14 03/26/2022 0831   ALT 19 06/20/2021 1513   ALT 27 09/24/2013 1131   ALKPHOS 77 03/26/2022 0831   ALKPHOS 81 09/24/2013 1131   BILITOT 0.4 03/26/2022 0831   BILITOT 0.4 11/02/2021 0000   BILITOT 0.4 06/20/2021 1513   BILITOT 0.53 09/24/2013 1131   GFRNONAA >60 03/26/2022 0831   GFRNONAA 51 (L) 06/20/2021 1513   GFRAA 55 (L) 01/24/2020 1530   GFRAA 57 (L) 12/21/2019 1259    No results found for: "SPEP", "UPEP"  Lab Results  Component Value Date   WBC 7.3 03/26/2022   NEUTROABS 4.8 03/26/2022   HGB 13.6 03/26/2022   HCT 40.3 03/26/2022   MCV 89.4 03/26/2022   PLT 312 03/26/2022      Chemistry      Component  Value Date/Time   NA 138 03/26/2022 0831   NA 140 07/09/2021 0813   NA 141 09/24/2013 1131   K 4.3 03/26/2022  0831   K 4.5 09/24/2013 1131   CL 103 03/26/2022 0831   CO2 29 03/26/2022 0831   CO2 24 09/24/2013 1131   BUN 29 (H) 03/26/2022 0831   BUN 20 07/09/2021 0813   BUN 21.2 09/24/2013 1131   CREATININE 0.99 03/26/2022 0831   CREATININE 1.19 (H) 06/20/2021 1513   CREATININE 1.1 09/24/2013 1131      Component Value Date/Time   CALCIUM 9.8 03/26/2022 0831   CALCIUM 9.5 09/24/2013 1131   ALKPHOS 77 03/26/2022 0831   ALKPHOS 81 09/24/2013 1131   AST 16 03/26/2022 0831   AST 21 06/20/2021 1513   AST 23 09/24/2013 1131   ALT 14 03/26/2022 0831   ALT 19 06/20/2021 1513   ALT 27 09/24/2013 1131   BILITOT 0.4 03/26/2022 0831   BILITOT 0.4 11/02/2021 0000   BILITOT 0.4 06/20/2021 1513   BILITOT 0.53 09/24/2013 1131

## 2022-03-27 ENCOUNTER — Telehealth: Payer: Self-pay | Admitting: Hematology and Oncology

## 2022-03-27 NOTE — Telephone Encounter (Signed)
Scheduled appointment per 1/25 los. Patient is aware. 

## 2022-03-30 DIAGNOSIS — J324 Chronic pansinusitis: Secondary | ICD-10-CM | POA: Diagnosis not present

## 2022-03-30 DIAGNOSIS — J029 Acute pharyngitis, unspecified: Secondary | ICD-10-CM | POA: Diagnosis not present

## 2022-03-30 DIAGNOSIS — R051 Acute cough: Secondary | ICD-10-CM | POA: Diagnosis not present

## 2022-04-04 ENCOUNTER — Encounter: Payer: Self-pay | Admitting: Hematology and Oncology

## 2022-04-06 ENCOUNTER — Encounter: Payer: Self-pay | Admitting: Hematology and Oncology

## 2022-04-23 ENCOUNTER — Encounter: Payer: Self-pay | Admitting: Hematology and Oncology

## 2022-04-24 ENCOUNTER — Other Ambulatory Visit (HOSPITAL_COMMUNITY): Payer: Self-pay

## 2022-04-24 ENCOUNTER — Ambulatory Visit (INDEPENDENT_AMBULATORY_CARE_PROVIDER_SITE_OTHER): Payer: 59 | Admitting: Family Medicine

## 2022-04-24 ENCOUNTER — Encounter: Payer: Self-pay | Admitting: Hematology and Oncology

## 2022-04-24 ENCOUNTER — Encounter (INDEPENDENT_AMBULATORY_CARE_PROVIDER_SITE_OTHER): Payer: Self-pay | Admitting: Family Medicine

## 2022-04-24 VITALS — BP 127/68 | HR 62 | Temp 97.7°F | Ht 66.0 in | Wt 184.0 lb

## 2022-04-24 DIAGNOSIS — F3289 Other specified depressive episodes: Secondary | ICD-10-CM

## 2022-04-24 DIAGNOSIS — E669 Obesity, unspecified: Secondary | ICD-10-CM

## 2022-04-24 DIAGNOSIS — E559 Vitamin D deficiency, unspecified: Secondary | ICD-10-CM | POA: Diagnosis not present

## 2022-04-24 DIAGNOSIS — Z6829 Body mass index (BMI) 29.0-29.9, adult: Secondary | ICD-10-CM

## 2022-04-24 DIAGNOSIS — R7303 Prediabetes: Secondary | ICD-10-CM | POA: Diagnosis not present

## 2022-04-24 MED ORDER — BUPROPION HCL ER (SR) 200 MG PO TB12
200.0000 mg | ORAL_TABLET | Freq: Two times a day (BID) | ORAL | 1 refills | Status: DC
  Filled 2022-04-24 – 2022-07-14 (×2): qty 180, 90d supply, fill #0
  Filled 2022-07-15: qty 180, 90d supply, fill #1

## 2022-04-24 MED ORDER — VITAMIN D (ERGOCALCIFEROL) 1.25 MG (50000 UNIT) PO CAPS
50000.0000 [IU] | ORAL_CAPSULE | ORAL | 1 refills | Status: DC
  Filled 2022-04-24 – 2022-07-15 (×2): qty 9, 90d supply, fill #0

## 2022-04-24 MED ORDER — METFORMIN HCL 500 MG PO TABS
ORAL_TABLET | ORAL | 1 refills | Status: DC
  Filled 2022-04-24 – 2022-07-14 (×2): qty 270, 90d supply, fill #0

## 2022-04-29 ENCOUNTER — Inpatient Hospital Stay: Payer: 59 | Attending: Hematology

## 2022-04-29 DIAGNOSIS — N183 Chronic kidney disease, stage 3 unspecified: Secondary | ICD-10-CM | POA: Insufficient documentation

## 2022-04-29 DIAGNOSIS — C569 Malignant neoplasm of unspecified ovary: Secondary | ICD-10-CM | POA: Insufficient documentation

## 2022-04-29 DIAGNOSIS — C786 Secondary malignant neoplasm of retroperitoneum and peritoneum: Secondary | ICD-10-CM | POA: Diagnosis not present

## 2022-04-29 LAB — COMPREHENSIVE METABOLIC PANEL
ALT: 15 U/L (ref 0–44)
AST: 18 U/L (ref 15–41)
Albumin: 4.3 g/dL (ref 3.5–5.0)
Alkaline Phosphatase: 89 U/L (ref 38–126)
Anion gap: 8 (ref 5–15)
BUN: 25 mg/dL — ABNORMAL HIGH (ref 8–23)
CO2: 28 mmol/L (ref 22–32)
Calcium: 9.8 mg/dL (ref 8.9–10.3)
Chloride: 99 mmol/L (ref 98–111)
Creatinine, Ser: 1.12 mg/dL — ABNORMAL HIGH (ref 0.44–1.00)
GFR, Estimated: 55 mL/min — ABNORMAL LOW (ref >60)
Glucose, Bld: 108 mg/dL — ABNORMAL HIGH (ref 70–99)
Potassium: 4 mmol/L (ref 3.5–5.1)
Sodium: 135 mmol/L (ref 135–145)
Total Bilirubin: 0.5 mg/dL (ref 0.3–1.2)
Total Protein: 6.6 g/dL (ref 6.5–8.1)

## 2022-04-29 LAB — CBC WITH DIFFERENTIAL/PLATELET
Abs Immature Granulocytes: 0.02 10*3/uL (ref 0.00–0.07)
Basophils Absolute: 0.1 10*3/uL (ref 0.0–0.1)
Basophils Relative: 1 %
Eosinophils Absolute: 0.2 10*3/uL (ref 0.0–0.5)
Eosinophils Relative: 2 %
HCT: 41.1 % (ref 36.0–46.0)
Hemoglobin: 14 g/dL (ref 12.0–15.0)
Immature Granulocytes: 0 %
Lymphocytes Relative: 28 %
Lymphs Abs: 2.1 10*3/uL (ref 0.7–4.0)
MCH: 30.1 pg (ref 26.0–34.0)
MCHC: 34.1 g/dL (ref 30.0–36.0)
MCV: 88.4 fL (ref 80.0–100.0)
Monocytes Absolute: 0.5 10*3/uL (ref 0.1–1.0)
Monocytes Relative: 7 %
Neutro Abs: 4.6 10*3/uL (ref 1.7–7.7)
Neutrophils Relative %: 62 %
Platelets: 303 10*3/uL (ref 150–400)
RBC: 4.65 MIL/uL (ref 3.87–5.11)
RDW: 12.4 % (ref 11.5–15.5)
WBC: 7.5 10*3/uL (ref 4.0–10.5)
nRBC: 0 % (ref 0.0–0.2)

## 2022-04-30 ENCOUNTER — Encounter: Payer: Self-pay | Admitting: Hematology and Oncology

## 2022-04-30 ENCOUNTER — Inpatient Hospital Stay (HOSPITAL_BASED_OUTPATIENT_CLINIC_OR_DEPARTMENT_OTHER): Payer: 59 | Admitting: Hematology and Oncology

## 2022-04-30 ENCOUNTER — Inpatient Hospital Stay: Payer: 59

## 2022-04-30 ENCOUNTER — Other Ambulatory Visit (HOSPITAL_COMMUNITY): Payer: Self-pay

## 2022-04-30 ENCOUNTER — Other Ambulatory Visit: Payer: Self-pay

## 2022-04-30 VITALS — BP 132/54 | HR 62 | Resp 18 | Ht 66.0 in | Wt 186.0 lb

## 2022-04-30 DIAGNOSIS — C569 Malignant neoplasm of unspecified ovary: Secondary | ICD-10-CM

## 2022-04-30 DIAGNOSIS — N183 Chronic kidney disease, stage 3 unspecified: Secondary | ICD-10-CM

## 2022-04-30 DIAGNOSIS — C786 Secondary malignant neoplasm of retroperitoneum and peritoneum: Secondary | ICD-10-CM | POA: Diagnosis not present

## 2022-04-30 MED ORDER — LEUPROLIDE ACETATE 3.75 MG IM KIT
3.7500 mg | PACK | Freq: Once | INTRAMUSCULAR | Status: AC
  Administered 2022-04-30: 3.75 mg via INTRAMUSCULAR
  Filled 2022-04-30: qty 3.75

## 2022-04-30 MED ORDER — ANASTROZOLE 1 MG PO TABS
1.0000 mg | ORAL_TABLET | Freq: Every day | ORAL | 3 refills | Status: DC
  Filled 2022-04-30 – 2022-07-04 (×2): qty 90, 90d supply, fill #0
  Filled 2022-10-14: qty 90, 90d supply, fill #1
  Filled 2023-01-15: qty 90, 90d supply, fill #2

## 2022-04-30 NOTE — Patient Instructions (Signed)
Leuprolide Solution for Injection What is this medication? LEUPROLIDE (loo PROE lide) reduces the symptoms of prostate cancer. It works by decreasing levels of the hormone testosterone in the body. This prevents prostate cancer cells from spreading or growing. This medicine may be used for other purposes; ask your health care provider or pharmacist if you have questions. COMMON BRAND NAME(S): Lupron What should I tell my care team before I take this medication? They need to know if you have any of these conditions: Diabetes Heart attack Heart disease High blood pressure High cholesterol Pain or difficulty passing urine Spinal cord metastasis Stroke Tobacco use An unusual or allergic reaction to leuprolide, other medications, foods, dyes, or preservatives Pregnant or trying to get pregnant Breast-feeding How should I use this medication? This medication is for injection under the skin or into a muscle. You will be taught how to prepare and give this medication. Use exactly as directed. Take your medication at regular intervals. Do not take it more often than directed. It is important that you put your used needles and syringes in a special sharps container. Do not put them in a trash can. If you do not have a sharps container, call your care team to get one. A special MedGuide will be given to you by the pharmacist with each prescription and refill. Be sure to read this information carefully each time. Talk to your care team about the use of this medication in children. While this medication may be prescribed for children as young as 8 years for selected conditions, precautions do apply. Overdosage: If you think you have taken too much of this medicine contact a poison control center or emergency room at once. NOTE: This medicine is only for you. Do not share this medicine with others. What if I miss a dose? If you miss a dose, take it as soon as you can. If it is almost time for your next  dose, take only that dose. Do not take double or extra doses. What may interact with this medication? Do not take this medication with any of the following: Chasteberry Cisapride Dronedarone Pimozide Thioridazine This medication may also interact with the following: Estrogen or progestin hormones Herbal or dietary supplements, like black cohosh or DHEA Other medications that cause heart rhythm changes Testosterone This list may not describe all possible interactions. Give your health care provider a list of all the medicines, herbs, non-prescription drugs, or dietary supplements you use. Also tell them if you smoke, drink alcohol, or use illegal drugs. Some items may interact with your medicine. What should I watch for while using this medication? Visit your care team for regular checks on your progress. During the first week, your symptoms may get worse, but then will improve as you continue your treatment. You may get hot flashes, increased bone pain, increased difficulty passing urine, or an aggravation of nerve symptoms. Discuss these effects with your care team, some of them may improve with continued use of this medication. Patients may experience a menstrual cycle or spotting during the first 2 months of therapy with this medication. If this continues, contact your care team. This medication may increase blood sugar. The risk may be higher in patients who already have diabetes. Ask your care team what you can do to lower your risk of diabetes while taking this medication. What side effects may I notice from receiving this medication? Side effects that you should report to your care team as soon as possible: Allergic reactions--skin rash,  itching, hives, swelling of the face, lips, tongue, or throat Heart attack--pain or tightness in the chest, shoulders, arms, or jaw, nausea, shortness of breath, cold or clammy skin, feeling faint or lightheaded Heart rhythm changes--fast or irregular  heartbeat, dizziness, feeling faint or lightheaded, chest pain, trouble breathing High blood sugar (hyperglycemia)--increased thirst or amount of urine, unusual weakness or fatigue, blurry vision Mood swings, irritability, hostility Seizures Stroke--sudden numbness or weakness of the face, arm, or leg, trouble speaking, confusion, trouble walking, loss of balance or coordination, dizziness, severe headache, change in vision Thoughts of suicide or self-harm, worsening mood, feelings of depression Side effects that usually do not require medical attention (report to your care team if they continue or are bothersome): Bone pain Change in sex drive or performance General discomfort and fatigue Hot flashes Muscle pain Pain, redness, or irritation at injection site Swelling of the ankles, hands, or feet This list may not describe all possible side effects. Call your doctor for medical advice about side effects. You may report side effects to FDA at 1-800-FDA-1088. Where should I keep my medication? Keep out of the reach of children and pets. Store below 25 degrees C (77 degrees F). Do not freeze. Protect from light. Get rid of any unused medication after the expiration date. To get rid of medications that are no longer needed or have expired: Take the medication to a medication take-back program. Check with your pharmacy or law enforcement to find a location. If you cannot return the medication, ask your pharmacist or care team how to get rid of this medication safely. NOTE: This sheet is a summary. It may not cover all possible information. If you have questions about this medicine, talk to your doctor, pharmacist, or health care provider.  2023 Elsevier/Gold Standard (2021-03-09 00:00:00)

## 2022-04-30 NOTE — Assessment & Plan Note (Signed)
Her renal function is stable Observe closely Discussed importance of oral fluid hydration

## 2022-04-30 NOTE — Progress Notes (Signed)
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[facility]) Her last CT imaging in October show stable disease control We will continue current treatment with Lupron every month as well as oral antiestrogen therapy She has some mild hot flashes and occasional nausea We will continue treatment Plan to repeat imaging study next in February 2024  CKD (chronic kidney disease), stage III Her renal function is stable Observe closely Discussed importance of oral fluid hydration  Orders Placed This Encounter  Procedures   CT ABDOMEN PELVIS W CONTRAST    Standing Status:   Future    Standing Expiration Date:   05/01/2023    Order Specific Question:   If indicated for the ordered procedure, I authorize the administration of contrast media per Radiology protocol    Answer:   Yes    Order Specific Question:   Preferred imaging location?    Answer:   [facility]    Order Specific Question:   Radiology Contrast Protocol - do NOT remove file path    Answer:   [email or web address]    All questions were answered. The patient knows to call the clinic with any problems, questions or concerns. The total time spent in the appointment was 20 minutes encounter with patients including review of chart and various tests results, discussions about plan of care and coordination of care plan   Heath Lark, MD 04/30/2022 8:45 AM  INTERVAL HISTORY: Please see below for problem oriented charting. she returns for treatment follow-up on Lupron and antiestrogen therapy for recurrent granulosa cell tumor of the ovary She has occasional nausea She has mild hot flashes, manageable Otherwise, she denies abdominal pain She denies recent constipation  REVIEW OF SYSTEMS:   Constitutional:  Denies fevers, chills or abnormal weight loss Eyes: Denies blurriness of vision Ears, nose, mouth, throat, and face: Denies mucositis or sore throat Respiratory: Denies cough, dyspnea or wheezes Cardiovascular: Denies palpitation, chest discomfort or lower extremity swelling Skin: Denies abnormal skin rashes Lymphatics: Denies new lymphadenopathy or easy bruising Neurological:Denies numbness, tingling or new weaknesses Behavioral/Psych: Mood is stable, no new changes  All other systems were reviewed with the patient and are negative.  I have reviewed the past medical history, past surgical history, social history and family history with the patient and they are unchanged from previous note.  ALLERGIES:  is allergic to codeine, thimerosal (thiomersal), ciprofloxacin, daypro [oxaprozin], levofloxacin, stadol [butorphanol tartrate], and sulfa antibiotics.  MEDICATIONS:  Current Outpatient Medications  Medication Sig Dispense Refill   acetaminophen (TYLENOL) 500 MG tablet Take 1,000 mg by mouth every 6 (six) hours as needed for moderate pain.     anastrozole (ARIMIDEX) 1 MG tablet Take 1 tablet (1 mg total) by mouth daily. 90 tablet 3   aspirin EC 81 MG tablet Take 81 mg by mouth daily. Swallow whole.     atorvastatin (LIPITOR) 40 MG tablet Take 1 tablet (40 mg total) by mouth daily. 90 tablet 3   Biotin 5 MG TABS Take 5 mg by mouth daily.     buPROPion (WELLBUTRIN SR) 200 MG 12 hr tablet Take 1 tablet (200 mg total) by mouth 2 (two) times daily. 180 tablet 1   Ca Phosphate-Cholecalciferol (EQL CALCIUM GUMMIES) 250-400 MG-UNIT CHEW Chew 1 tablet by mouth 2 (two) times daily.     escitalopram (  LEXAPRO) 20 MG tablet Take 1 tablet (20 mg total) by mouth daily. 90 tablet 3   gabapentin (NEURONTIN) 300 MG capsule Take 1 capsule (300 mg total) by mouth 2 (two) times daily. 60 capsule 3   leuprolide (LUPRON) 30 MG injection Inject 30 mg into the muscle every 30 (thirty) days.     metFORMIN  (GLUCOPHAGE) 500 MG tablet Take 1 tablet (500 mg total) by mouth every morning AND 2 tablets (1,000 mg total) every evening. Needs office visit. 270 tablet 1   mirabegron ER (MYRBETRIQ) 50 MG TB24 tablet Take 1 tablet (50 mg total) by mouth daily. 30 tablet 11   nitroGLYCERIN (NITROSTAT) 0.4 MG SL tablet Place 1 tablet (0.4 mg total) under the tongue every 5 (five) minutes as needed for chest pain. 25 tablet 3   omeprazole (PRILOSEC) 20 MG capsule Take 1 capsule by mouth once a day 90 capsule 3   Polyethyl Glycol-Propyl Glycol (SYSTANE OP) Place 1 drop into both eyes daily as needed (dry eyes).     Prenatal MV & Min w/FA-DHA (PRENATAL GUMMIES) 0.18-25 MG CHEW Chew 1 capsule by mouth daily.     Probiotic Product (ALIGN PO) Take 1 tablet by mouth daily.      Vitamin D, Ergocalciferol, (DRISDOL) 1.25 MG (50000 UNIT) CAPS capsule Take 1 capsule (50,000 Units total) by mouth every 10 days. 9 capsule 1   No current facility-administered medications for this visit.    SUMMARY OF ONCOLOGIC HISTORY: Oncology History Overview Note  2018 - Core biopsy for ER/PR and Foundation One testing performed. Unfortunately, no sufficient tissue for Foundation One testing. ER was 50% and PR 90%. Progressed on carboplatin, exemestane, Avastin,Tamoxifen/Megace and letrozole, mixed response on Lupron  AMH: 06/23/19: 108 05/26/19: 9.04 01/05/19: 6.84 09/02/18: 3.72 06/01/18: 3.84 02/24/18: 2.6 11/21/17: 3.26 08/26/17: 1.77 05/27/17: 2.12 01/28/17: 2.47 06/10/16: 2.68 02/06/16: 1.84 05/12/15: 3.27 10/03/14: 2.12  Inhibin B 09/22/2019: 95.9 05/26/19: 90.2 01/05/19: 92 09/02/18: 70.9 06/04/18: 70 02/24/18: 79.5 11/21/17: 85.8 08/26/17: 65.6 05/27/17: 58.9 01/28/17: 198 06/10/16: 118.1 02/06/16: 62.4 05/12/15: 64.6 10/03/14: 58   Malignant granulosa cell tumor of ovary ([place])  1994 Initial Diagnosis   1994   2002 Relapse/Recurrence   Upper abdominal recurrence, resected.     - 09/2000 Chemotherapy   6 cycles of IP cisplatin and  etoposide    02/2009 Relapse/Recurrence   CT showed increased size of nodules in pelvis   2010 Surgery   Exlap with section of tumor nodules near cecum and left pelvic sidewall. Tumor: ER negative, PR positive    Treatment Plan Change   Alternated 2 week courses of Megace and Tamoxifen - ended 03/2012   03/2012 PET scan   CT - progressive disease   2013 Treatment Plan Change   Letrozole   01/2016 Imaging   MRI showed progressive disease   2017 Treatment Plan Change   Two weeks of alternating Tamoxifen '20mg'$  daily and then Megace '40mg'$  TID   08/2016 Imaging   MRI showed progressive disease with peritoneal implants near liver, in pelvis   01/2017 Treatment Plan Change   Lupron 11.25 q 3 months   11/2017 Imaging   Overall mixed response.   Mixed cystic/solid lesions in the left pelvis are mildly improved.   Cystic peritoneal disease, including the dominant lesion along the posterior right hepatic lobe, is mildly progressed.   Subcapsular lesion along the posterior right hepatic lobe is unchanged.   03/2018 Imaging   MRI A/P: Mixed response of individual peritoneal  metastases in the pelvis, as described above. Overall, there has been no significant change in bulk of disease.   Stable cystic peritoneal metastatic disease along the capsular surfaces of the liver and spleen.   No new sites of metastatic disease identified within the abdomen or pelvis.   08/2018 Imaging   MRI A/P: Status post hysterectomy and bilateral salpingo-oophorectomy.   Mixed cystic/solid peritoneal implants in the abdomen/pelvis, as above. Dominant cystic implant along the posterior liver surface is mildly increased. Remaining lesions are overall grossly unchanged.   No new lesions are identified.   01/28/2019 Imaging   Mri A/P: 1. Relatively similar appearance of peritoneal metastasis. A posterior right hepatic capsular based lesion is similar to minimally decreased in size. Left pelvic implants  are primarily similar with possible enlargement of an anterior high left pelvic cystic implant. No new disease identified. 2.  Aortic Atherosclerosis (ICD10-I70.0). 3. Hepatic steatosis. 4. Left adrenal adenoma.   06/02/2019 Imaging   MRI 1. Potential slight enlargement of dominant cystic area and solid component, associated with rind like signal variation on T2 along the inferior right hepatic margin, also potentially slightly increased. Findings may still be within the realm of measurement and technical variation. Close attention on follow-up. 2. Subtle cystic changes along the cephalad margin of the spleen are difficult to see on previous imaging, perhaps new compared with prior imaging studies. 3. Pelvic implants and left lower quadrant lesion with similar size, of the area along the left iliac vasculature may be slightly larger than on the prior study. 4. Signs of extensive retroperitoneal and pelvic lymphadenectomy. 5. Hepatic steatosis. 6. 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     06/08/2019 Cancer Staging   Staging form: [place], AJCC 7th Edition - Clinical: Stage IIIC (rT2, N1, M0) - Signed by Heath Lark, MD on 06/08/2019   06/10/2019 Imaging   1. 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 These findings are slightly worsened in comparison to a remote prior CT examination dated 10/04/2014.   2.  No evidence of metastatic disease in the chest.   3. Status post hysterectomy, pelvic and retroperitoneal lymph node dissection, and ventral hernia mesh repair.   4.  Hepatic steatosis.   5.  Aortic Atherosclerosis (ICD10-I70.0).   06/24/2019 - 09/02/2019 Chemotherapy   The patient had carboplatin for chemotherapy treatment.     09/02/2019 Tumor Marker    Patient's tumor was tested for the following markers: Inhibin B Results of the tumor marker test revealed 94   09/23/2019 Imaging   1. Interval progression of the soft tissue lesions in the anterior left pelvis, likely peritoneal implants, compatible with disease progression. Remaining sites of apparent disease along the liver capsule and posterior spleen are stable 2. Stable 2 cm left adrenal adenoma. 3. Hepatic steatosis. 4. Right-side predominant colonic diverticulosis without diverticulitis. 5. Aortic Atherosclerosis (ICD10-I70.0).   12/23/2019 Imaging   1. Slight interval increase in size of a mixed solid and cystic nodule in the left hemipelvis measuring 3.0 x 2.9 cm, previously 2.7 x 2.1 cm. 2. Interval decrease in size of a nodule in the left paracolic gutter measuring 1.0 x 0.8 cm, previously 2.1 x 1.6 cm. 3. Stable peritoneal nodules in the hepatorenal recess and at the inferior tip of the spleen. 4. Unchanged nodule or  lymph node overlying the left external iliac artery. 5. Enlargement of dominant left pelvic nodule is concerning for disease progression despite interval decrease in size of a nodule in the left paracolic gutter and stability of other nodules. 6. No evidence of metastatic disease in the chest. 7. Stable, benign left adrenal adenoma. 8. Ventral hernia mesh repair with a small component of recurrent hernia inferiorly, containing a single nonobstructed loop of small bowel. 9. Hepatic steatosis. 10. Aortic Atherosclerosis (ICD10-I70.0).   12/23/2019 Tumor Marker   Patient's tumor was tested for the following markers: Inhibin B Results of the tumor marker test revealed 94.9   01/25/2020 Tumor Marker   Patient's tumor was tested for the following markers: Inhibin B Results of the tumor marker test revealed 116.7   02/20/2020 Genetic Testing   Negative genetic testing: no pathogenic variants detected in [facility] Multi-Cancer Panel.  Variant of uncertain significance in  HOXB13 at c.649C>T (p.Arg217Cys).  The report date is February 20, 2020.   The Multi-Cancer Panel offered by [facility] includes sequencing and/or deletion duplication testing of the following 85 genes: AIP, ALK, APC, ATM, AXIN2,BAP1,  BARD1, BLM, BMPR1A, BRCA1, BRCA2, BRIP1, CASR, CDC73, CDH1, CDK4, CDKN1B, CDKN1C, CDKN2A (p14ARF), CDKN2A (p16INK4a), CEBPA, CHEK2, CTNNA1, DICER1, DIS3L2, EGFR (c.2369C>T, p.Thr790Met variant only), EPCAM (Deletion/duplication testing only), FH, FLCN, GATA2, GPC3, GREM1 (Promoter region deletion/duplication testing only), HOXB13 (c.251G>A, p.Gly84Glu), HRAS, KIT, MAX, MEN1, MET, MITF (c.952G>A, p.Glu318Lys variant only), MLH1, MSH2, MSH3, MSH6, MUTYH, NBN, NF1, NF2, NTHL1, PALB2, PDGFRA, PHOX2B, PMS2, POLD1, POLE, POT1, PRKAR1A, PTCH1, PTEN, RAD50, RAD51C, RAD51D, RB1, RECQL4, RET, RNF43, RUNX1, SDHAF2, SDHA (sequence changes only), SDHB, SDHC, SDHD, SMAD4, SMARCA4, SMARCB1, SMARCE1, STK11, SUFU, TERC, TERT, TMEM127, TP53, TSC1, TSC2, VHL, WRN and WT1.    03/02/2020 Tumor Marker   Patient's tumor was tested for the following markers: Inhibin B Results of the tumor marker test revealed 80.8   04/17/2020 Imaging   1. Overall, exam is stable. Multiple peritoneal nodules are again seen. The index nodule in the left lower quadrant is mildly increased in size in the interval. The lesion within the anterior left pelvis has decreased in size in the interval. There has also been decrease in size of cystic lesion within the a hepatorenal recess. The remaining peritoneal lesions are unchanged. No new lesions identified. 2. Stable left adrenal nodule. 3.  Aortic Atherosclerosis (ICD10-I70.0).     08/21/2020 Imaging   Bone density is normal AP spine T score 0.8 Femoral neck on the left, T score -0.2 Femoral neck on the right ,T score -0.4   10/17/2020 Imaging   1. Interval progression of peritoneal nodules along the left pelvic sidewall, concerning for progressive metastatic  disease. 2. Small capsular lesion medial right liver and the apparent cystic lesion superior to the right kidney are similar to prior. 3. Tiny soft tissue nodule anterior aspect of the lateral left pelvis described as decreasing on the prior study has decreased further on today's exam. 4. Stable left adrenal nodule. This cannot be definitively characterized. 5. Tiny nonobstructing stone lower pole right kidney. 6. Aortic Atherosclerosis (ICD10-I70.0).   10/26/2020 Procedure   Successful placement of a right internal jugular approach power injectable Port-A-Cath. The catheter is ready for immediate use.       10/31/2020 - 10/04/2021 Chemotherapy   Patient is on Treatment Plan : OVARIAN Paclitaxel Q4,6,96,29 q28d     01/25/2021 Imaging   1. Signs of peritoneal disease with scattered peritoneal nodules. The index lesions are either stable or decreased  in size in the interval as detailed above. No new sites of disease. 2. Ventral pelvic wall hernia contains a nonobstructed loop of small bowel. 3. Aortic Atherosclerosis (ICD10-I70.0).     06/14/2021 Imaging   1. Multiple peritoneal metastatic nodules are slightly diminished in size. 2. A left external iliac lymph node or nodule is however stable to slightly enlarged. 3. Overall findings are most consistent with continued treatment response of metastatic disease. 4. No evidence of metastatic disease within the chest. 5. Hepatic steatosis.     06/14/2021 Imaging   Right: Findings consistent with 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   Left: No evidence of thrombosis in the subclavian.   06/20/2021 Procedure   Successful right IJ vein Port-A-Cath explant.     10/18/2021 Imaging   1. Similar to mild interval increase in size of left external iliac lymph node. 2. Multiple peritoneal metastatic nodules are grossly unchanged in size when compared to prior exam   02/14/2022  Imaging   1. Enlarging lymph node along the LEFT iliac chain, compatible with mild worsening of disease in this area. Stable stigmata of disease in the upper abdomen. 2. Post LEFT retroperitoneal and pelvic lymphadenectomy. 3. Stable LEFT adrenal adenoma. 4. Stable 4 mm pulmonary nodule in the LEFT lung base. 5. Aortic atherosclerosis. 6. Mild hepatic steatosis. 7. Post abdominal wall reconstruction with hernia below the mesh as on previous imaging.       PHYSICAL EXAMINATION: ECOG PERFORMANCE STATUS: 1 - Symptomatic but completely ambulatory  Vitals:   04/30/22 0828  BP: (!) 132/54  Pulse: 62  Resp: 18  SpO2: 99%   Filed Weights   04/30/22 0828  Weight: 186 lb (84.4 kg)    GENERAL:alert, no distress and comfortable NEURO: alert & oriented x 3 with fluent speech, no focal motor/sensory deficits  LABORATORY DATA:  I have reviewed the data as listed    Component Value Date/Time   NA 135 04/29/2022 1353   NA 140 07/09/2021 0813   NA 141 09/24/2013 1131   K 4.0 04/29/2022 1353   K 4.5 09/24/2013 1131   CL 99 04/29/2022 1353   CO2 28 04/29/2022 1353   CO2 24 09/24/2013 1131   GLUCOSE 108 (H) 04/29/2022 1353   GLUCOSE 90 09/24/2013 1131   BUN 25 (H) 04/29/2022 1353   BUN 20 07/09/2021 0813   BUN 21.2 09/24/2013 1131   CREATININE 1.12 (H) 04/29/2022 1353   CREATININE 1.19 (H) 06/20/2021 1513   CREATININE 1.1 09/24/2013 1131   CALCIUM 9.8 04/29/2022 1353   CALCIUM 9.5 09/24/2013 1131   PROT 6.6 04/29/2022 1353   PROT 6.7 11/02/2021 0000   PROT 6.8 09/24/2013 1131   ALBUMIN 4.3 04/29/2022 1353   ALBUMIN 4.4 11/02/2021 0000   ALBUMIN 3.9 09/24/2013 1131   AST 18 04/29/2022 1353   AST 21 06/20/2021 1513   AST 23 09/24/2013 1131   ALT 15 04/29/2022 1353   ALT 19 06/20/2021 1513   ALT 27 09/24/2013 1131   ALKPHOS 89 04/29/2022 1353   ALKPHOS 81 09/24/2013 1131   BILITOT 0.5 04/29/2022 1353   BILITOT 0.4 11/02/2021 0000   BILITOT 0.4 06/20/2021 1513   BILITOT  0.53 09/24/2013 1131   GFRNONAA 55 (L) 04/29/2022 1353   GFRNONAA 51 (L) 06/20/2021 1513   GFRAA 55 (L) 01/24/2020 1530   GFRAA 57 (L) 12/21/2019 1259    No results found for: "SPEP", "UPEP"  Lab Results  Component Value Date   WBC 7.5 04/29/2022   NEUTROABS 4.6 04/29/2022   HGB 14.0 04/29/2022   HCT 41.1 04/29/2022   MCV 88.4 04/29/2022   PLT 303 04/29/2022      Chemistry      Component Value Date/Time   NA 135 04/29/2022 1353   NA 140 07/09/2021 0813   NA 141 09/24/2013 1131   K 4.0 04/29/2022 1353   K 4.5 09/24/2013 1131   CL 99 04/29/2022 1353   CO2 28 04/29/2022 1353   CO2 24 09/24/2013 1131   BUN 25 (H) 04/29/2022 1353   BUN 20 07/09/2021 0813   BUN 21.2 09/24/2013 1131   CREATININE 1.12 (H) 04/29/2022 1353   CREATININE 1.19 (H) 06/20/2021 1513   CREATININE 1.1 09/24/2013 1131      Component Value Date/Time   CALCIUM 9.8 04/29/2022 1353   CALCIUM 9.5 09/24/2013 1131   ALKPHOS 89 04/29/2022 1353   ALKPHOS 81 09/24/2013 1131   AST 18 04/29/2022 1353   AST 21 06/20/2021 1513   AST 23 09/24/2013 1131   ALT 15 04/29/2022 1353   ALT 19 06/20/2021 1513   ALT 27 09/24/2013 1131   BILITOT 0.5 04/29/2022 1353   BILITOT 0.4 11/02/2021 0000   BILITOT 0.4 06/20/2021 1513   BILITOT 0.53 09/24/2013 1131

## 2022-04-30 NOTE — Assessment & Plan Note (Addendum)
Her last CT imaging in October show stable disease control We will continue current treatment with Lupron every month as well as oral antiestrogen therapy She has some mild hot flashes and occasional nausea We will continue treatment Plan to repeat imaging study next in February 2024

## 2022-05-01 ENCOUNTER — Telehealth: Payer: Self-pay | Admitting: Hematology and Oncology

## 2022-05-01 NOTE — Telephone Encounter (Signed)
Scheduled appointment per 1/9 los. Patient is aware.

## 2022-05-07 ENCOUNTER — Other Ambulatory Visit (HOSPITAL_COMMUNITY): Payer: Self-pay

## 2022-05-07 MED ORDER — OMEPRAZOLE 20 MG PO CPDR
20.0000 mg | DELAYED_RELEASE_CAPSULE | Freq: Every day | ORAL | 2 refills | Status: DC
  Filled 2022-05-07: qty 90, 90d supply, fill #0
  Filled 2022-08-19: qty 90, 90d supply, fill #1
  Filled 2022-10-14 – 2022-11-10 (×2): qty 90, 90d supply, fill #2

## 2022-05-07 MED ORDER — ESCITALOPRAM OXALATE 20 MG PO TABS
20.0000 mg | ORAL_TABLET | Freq: Every day | ORAL | 2 refills | Status: DC
  Filled 2022-05-07: qty 90, 90d supply, fill #0
  Filled 2022-08-19: qty 90, 90d supply, fill #1
  Filled 2022-10-14 – 2022-11-10 (×2): qty 90, 90d supply, fill #2

## 2022-05-07 NOTE — Progress Notes (Signed)
Chief Complaint:   OBESITY [name] is here to discuss her progress with her obesity treatment plan along with follow-up of her obesity related diagnoses. [name] is on practicing portion control and making smarter food choices, such as increasing vegetables and decreasing simple carbohydrates and states she is following her eating plan approximately 90% of the time. [name] states she is not exercising.  Today's visit was #: 78 Starting weight: 51 LBS Starting date: 04/29/2018 Today's weight: 184 LBS Today's date: 04/24/2022 Total lbs lost to date: 25 LBS Total lbs lost since last in-office visit: +1 LB  Interim History: Patient went to the [place] to focus on increasing protein and limiting portions.  Daughter lives in [place].  No issues with meal plan and is doing great.  Patient is here to review labs from last office visit.  Subjective:   1. Vitamin D deficiency Discussed labs with patient today.   Early November patient was changed to every 10 days of vitamin D due to the level on 02/12/2022 of 81.7.  2. Prediabetes Discussed labs with patient today. Last A1c and fasting insulin was stable and at goal.  Patient denies hunger or cravings, in fact she is not really hungry but gets foods in.  3. Other depression, with emotional eating Patient is stable with no issues.  Patient is feeling great.  Assessment/Plan:  No orders of the defined types were placed in this encounter.   Medications Discontinued During This Encounter  Medication Reason   buPROPion (WELLBUTRIN SR) 200 MG 12 hr tablet Reorder   metFORMIN (GLUCOPHAGE) 500 MG tablet Reorder   Vitamin D, Ergocalciferol, (DRISDOL) 1.25 MG (50000 UNIT) CAPS capsule Reorder   isosorbide mononitrate (IMDUR) 30 MG 24 hr tablet Completed Course     Meds ordered this encounter  Medications   buPROPion (WELLBUTRIN SR) 200 MG 12 hr tablet    Sig: Take 1 tablet (200 mg total) by mouth 2 (two) times daily.     Dispense:  180 tablet    Refill:  1    90 d supply; ov for RF   Vitamin D, Ergocalciferol, (DRISDOL) 1.25 MG (50000 UNIT) CAPS capsule    Sig: Take 1 capsule (50,000 Units total) by mouth every 10 days.    Dispense:  9 capsule    Refill:  1   metFORMIN (GLUCOPHAGE) 500 MG tablet    Sig: Take 1 tablet (500 mg total) by mouth every morning AND 2 tablets (1,000 mg total) every evening. Needs office visit.    Dispense:  270 tablet    Refill:  1    90 d supply; ov for RF     1. Vitamin D deficiency Recheck vitamin D level at next office visit.  Patient is getting in bone density exams regularly.  Refill- Vitamin D, Ergocalciferol, (DRISDOL) 1.25 MG (50000 UNIT) CAPS capsule; Take 1 capsule (50,000 Units total) by mouth every 10 days.  Dispense: 9 capsule; Refill: 1  2. Prediabetes Vitamin B12, A1c, fasting insulin within normal limits.  Continue PNP and weight loss.  Refill metformin at the same dose.  Refill- metFORMIN (GLUCOPHAGE) 500 MG tablet; Take 1 tablet (500 mg total) by mouth every morning AND 2 tablets (1,000 mg total) every evening. Needs office visit.  Dispense: 270 tablet; Refill: 1  3. Other depression, with emotional eating Start to increase exercise to help with mood as well as weight loss maintenance.  Refill- buPROPion (WELLBUTRIN SR) 200 MG 12 hr tablet; Take  1 tablet (200 mg total) by mouth 2 (two) times daily.  Dispense: 180 tablet; Refill: 1  4. Obesity with current BMI of 29.7 Goal is to walk 20 minutes 3 days/week at work and go to [employer] at least 1 day/week for resistance training.  [name] is currently in the action stage of change. As such, her goal is to continue with weight loss efforts. She has agreed to practicing portion control and making smarter food choices, such as increasing vegetables and decreasing simple carbohydrates.   Exercise goals: Goal is to walk 20 minutes 3 days/week at work and go to [employer] at least 1 day/week for resistance  training.  Behavioral modification strategies: increasing lean protein intake and increasing water intake.  [name] has agreed to follow-up with our clinic in 4 months. She was informed of the importance of frequent follow-up visits to maximize her success with intensive lifestyle modifications for her multiple health conditions.   Objective:   Blood pressure 127/68, pulse 62, temperature 97.7 F (36.5 C), height '5\' 6"'$  (1.676 m), weight 184 lb (83.5 kg), SpO2 99 %. Body mass index is 29.7 kg/m.  General: Cooperative, alert, well developed, in no acute distress. HEENT: Conjunctivae and lids unremarkable. Cardiovascular: Regular rhythm.  Lungs: Normal work of breathing. Neurologic: No focal deficits.   Lab Results  Component Value Date   CREATININE 1.12 (H) 04/29/2022   BUN 25 (H) 04/29/2022   NA 135 04/29/2022   K 4.0 04/29/2022   CL 99 04/29/2022   CO2 28 04/29/2022   Lab Results  Component Value Date   ALT 15 04/29/2022   AST 18 04/29/2022   ALKPHOS 89 04/29/2022   BILITOT 0.5 04/29/2022   Lab Results  Component Value Date   HGBA1C 5.5 02/12/2022   HGBA1C 5.4 07/09/2021   HGBA1C 5.2 11/30/2019   HGBA1C 5.3 05/31/2019   HGBA1C 5.2 11/19/2018   Lab Results  Component Value Date   INSULIN 17.4 02/12/2022   INSULIN 17.1 07/09/2021   INSULIN 23.8 11/30/2019   INSULIN 16.5 05/31/2019   INSULIN 17.6 11/19/2018   Lab Results  Component Value Date   TSH 0.836 02/12/2022   Lab Results  Component Value Date   CHOL 255 (H) 07/09/2021   HDL 50 07/09/2021   LDLCALC 182 (H) 07/09/2021   TRIG 126 07/09/2021   Lab Results  Component Value Date   VD25OH 81.7 02/12/2022   VD25OH 38.6 07/09/2021   VD25OH 59.0 11/30/2019   Lab Results  Component Value Date   WBC 7.5 04/29/2022   HGB 14.0 04/29/2022   HCT 41.1 04/29/2022   MCV 88.4 04/29/2022   PLT 303 04/29/2022   No results found for: "IRON", "TIBC", "FERRITIN"  Attestation Statements:   Reviewed by  clinician on day of visit: allergies, medications, problem list, medical history, surgical history, family history, social history, and previous encounter notes.  I, Davy Pique, RMA, am acting as Location manager for [employer], DO.   I have reviewed the above documentation for accuracy and completeness, and I agree with the above. [name], D.O.  The [place] was signed into law in 2016 which includes the topic of electronic health records.  This provides immediate access to information in MyChart.  This includes consultation notes, operative notes, office notes, lab results and pathology reports.  If you have any questions about what you read please let us know at your next visit so we can discuss your concerns and take corrective action if  need be.  We are right here with you.

## 2022-05-09 ENCOUNTER — Other Ambulatory Visit (HOSPITAL_COMMUNITY): Payer: Self-pay

## 2022-05-09 ENCOUNTER — Encounter (HOSPITAL_COMMUNITY): Payer: Self-pay

## 2022-05-13 ENCOUNTER — Other Ambulatory Visit: Payer: Self-pay

## 2022-05-23 ENCOUNTER — Ambulatory Visit (HOSPITAL_COMMUNITY)
Admission: RE | Admit: 2022-05-23 | Discharge: 2022-05-23 | Disposition: A | Discharge status: Home / Self Care | Payer: 59 | Source: Ambulatory Visit | Attending: Hematology and Oncology | Admitting: Hematology and Oncology

## 2022-05-23 DIAGNOSIS — C569 Malignant neoplasm of unspecified ovary: Secondary | ICD-10-CM | POA: Diagnosis not present

## 2022-05-23 MED ORDER — SODIUM CHLORIDE (PF) 0.9 % IJ SOLN
INTRAMUSCULAR | Status: AC
  Filled 2022-05-23: qty 50

## 2022-05-23 MED ORDER — IOHEXOL 300 MG/ML  SOLN
100.0000 mL | Freq: Once | INTRAMUSCULAR | Status: AC | PRN
  Administered 2022-05-23: 100 mL via INTRAVENOUS

## 2022-05-27 ENCOUNTER — Inpatient Hospital Stay: Payer: 59

## 2022-05-27 ENCOUNTER — Inpatient Hospital Stay: Payer: 59 | Attending: Hematology

## 2022-05-27 DIAGNOSIS — K648 Other hemorrhoids: Secondary | ICD-10-CM | POA: Diagnosis not present

## 2022-05-27 DIAGNOSIS — K5790 Diverticulosis of intestine, part unspecified, without perforation or abscess without bleeding: Secondary | ICD-10-CM | POA: Diagnosis not present

## 2022-05-27 DIAGNOSIS — C569 Malignant neoplasm of unspecified ovary: Secondary | ICD-10-CM | POA: Insufficient documentation

## 2022-05-27 DIAGNOSIS — Z800 Family history of malignant neoplasm of digestive organs: Secondary | ICD-10-CM | POA: Diagnosis not present

## 2022-05-27 DIAGNOSIS — R198 Other specified symptoms and signs involving the digestive system and abdomen: Secondary | ICD-10-CM | POA: Diagnosis not present

## 2022-05-27 DIAGNOSIS — C786 Secondary malignant neoplasm of retroperitoneum and peritoneum: Secondary | ICD-10-CM | POA: Diagnosis not present

## 2022-05-27 LAB — CBC WITH DIFFERENTIAL/PLATELET
Abs Immature Granulocytes: 0.02 10*3/uL (ref 0.00–0.07)
Basophils Absolute: 0.1 10*3/uL (ref 0.0–0.1)
Basophils Relative: 1 %
Eosinophils Absolute: 0.2 10*3/uL (ref 0.0–0.5)
Eosinophils Relative: 3 %
HCT: 38 % (ref 36.0–46.0)
Hemoglobin: 13.1 g/dL (ref 12.0–15.0)
Immature Granulocytes: 0 %
Lymphocytes Relative: 32 %
Lymphs Abs: 2.2 10*3/uL (ref 0.7–4.0)
MCH: 30.3 pg (ref 26.0–34.0)
MCHC: 34.5 g/dL (ref 30.0–36.0)
MCV: 87.8 fL (ref 80.0–100.0)
Monocytes Absolute: 0.5 10*3/uL (ref 0.1–1.0)
Monocytes Relative: 8 %
Neutro Abs: 3.8 10*3/uL (ref 1.7–7.7)
Neutrophils Relative %: 56 %
Platelets: 284 10*3/uL (ref 150–400)
RBC: 4.33 MIL/uL (ref 3.87–5.11)
RDW: 12.3 % (ref 11.5–15.5)
WBC: 6.8 10*3/uL (ref 4.0–10.5)
nRBC: 0 % (ref 0.0–0.2)

## 2022-05-27 LAB — COMPREHENSIVE METABOLIC PANEL
ALT: 15 U/L (ref 0–44)
AST: 18 U/L (ref 15–41)
Albumin: 3.8 g/dL (ref 3.5–5.0)
Alkaline Phosphatase: 76 U/L (ref 38–126)
Anion gap: 9 (ref 5–15)
BUN: 20 mg/dL (ref 8–23)
CO2: 28 mmol/L (ref 22–32)
Calcium: 9 mg/dL (ref 8.9–10.3)
Chloride: 102 mmol/L (ref 98–111)
Creatinine, Ser: 0.97 mg/dL (ref 0.44–1.00)
GFR, Estimated: >60 mL/min (ref >60)
Glucose, Bld: 96 mg/dL (ref 70–99)
Potassium: 4.1 mmol/L (ref 3.5–5.1)
Sodium: 139 mmol/L (ref 135–145)
Total Bilirubin: 0.9 mg/dL (ref 0.3–1.2)
Total Protein: 7 g/dL (ref 6.5–8.1)

## 2022-05-28 ENCOUNTER — Encounter: Payer: Self-pay | Admitting: Hematology and Oncology

## 2022-05-28 ENCOUNTER — Inpatient Hospital Stay (HOSPITAL_BASED_OUTPATIENT_CLINIC_OR_DEPARTMENT_OTHER): Payer: 59 | Admitting: Hematology and Oncology

## 2022-05-28 ENCOUNTER — Inpatient Hospital Stay: Payer: 59

## 2022-05-28 VITALS — BP 124/83 | HR 67 | Temp 98.0°F | Resp 18 | Ht 66.0 in | Wt 188.4 lb

## 2022-05-28 DIAGNOSIS — C569 Malignant neoplasm of unspecified ovary: Secondary | ICD-10-CM | POA: Diagnosis not present

## 2022-05-28 DIAGNOSIS — C786 Secondary malignant neoplasm of retroperitoneum and peritoneum: Secondary | ICD-10-CM | POA: Diagnosis not present

## 2022-05-28 MED ORDER — LEUPROLIDE ACETATE 3.75 MG IM KIT
3.7500 mg | PACK | Freq: Once | INTRAMUSCULAR | Status: AC
  Administered 2022-05-28: 3.75 mg via INTRAMUSCULAR
  Filled 2022-05-28: qty 3.75

## 2022-05-28 NOTE — Progress Notes (Signed)
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   All questions were answered. The patient knows to call the clinic with any problems, questions or concerns. The total time spent in the appointment was 20 minutes encounter with patients including review of chart and various tests results, discussions about plan of care and coordination of care plan   Heath Lark, MD 05/28/2022 9:10 AM  INTERVAL HISTORY: Please see below for problem oriented charting. she returns for treatment follow-up and review test results She was recently recommended to start fiber supplement due to diverticulitis She denies major hot flashes or mood swing with current treatment We spent majority of our time reviewing test results  REVIEW OF SYSTEMS:   Constitutional: Denies fevers, chills or abnormal weight loss Eyes: Denies blurriness of vision Ears, nose, mouth, throat, and face: Denies mucositis or sore throat Respiratory: Denies cough, dyspnea or wheezes Cardiovascular: Denies palpitation, chest discomfort or lower extremity swelling Gastrointestinal:  Denies nausea, heartburn or change in bowel habits Skin: Denies abnormal skin rashes Lymphatics: Denies new lymphadenopathy or easy bruising Neurological:Denies numbness, tingling or new weaknesses Behavioral/Psych: Mood is stable, no new changes  All other  systems were reviewed with the patient and are negative.  I have reviewed the past medical history, past surgical history, social history and family history with the patient and they are unchanged from previous note.  ALLERGIES:  is allergic to codeine, thimerosal (thiomersal), ciprofloxacin, daypro [oxaprozin], levofloxacin, stadol [butorphanol tartrate], and sulfa antibiotics.  MEDICATIONS:  Current Outpatient Medications  Medication Sig Dispense Refill   psyllium (METAMUCIL) 58.6 % powder Take 1 packet by mouth 2 (two) times daily.     acetaminophen (TYLENOL) 500 MG tablet Take 1,000 mg by mouth every 6 (six) hours as needed for moderate pain.     anastrozole (ARIMIDEX) 1 MG tablet Take 1 tablet (1 mg total) by mouth daily. 90 tablet 3   aspirin EC 81 MG tablet Take 81 mg by mouth daily. Swallow whole.     atorvastatin (LIPITOR) 40 MG tablet Take 1 tablet (40 mg total) by mouth daily. 90 tablet 3   Biotin 5 MG TABS Take 5 mg by mouth daily.     buPROPion (WELLBUTRIN SR) 200 MG 12 hr tablet Take 1 tablet (200 mg total) by mouth 2 (two) times daily. 180 tablet 1   Ca Phosphate-Cholecalciferol (EQL CALCIUM GUMMIES) 250-400 MG-UNIT CHEW Chew 1 tablet by mouth 2 (two) times daily.     escitalopram (LEXAPRO) 20 MG tablet Take 1 tablet (20 mg total) by mouth daily. 90 tablet 2   gabapentin (NEURONTIN) 300 MG capsule Take 1 capsule (300 mg total) by mouth 2 (two) times daily. 60 capsule 3   leuprolide (LUPRON) 30 MG injection Inject 30 mg into the muscle every 30 (thirty) days.     metFORMIN (GLUCOPHAGE) 500 MG tablet Take 1 tablet (500 mg total) by mouth every  morning AND 2 tablets (1,000 mg total) every evening. Needs office visit. 270 tablet 1   mirabegron ER (MYRBETRIQ) 50 MG TB24 tablet Take 1 tablet (50 mg total) by mouth daily. 30 tablet 11   nitroGLYCERIN (NITROSTAT) 0.4 MG SL tablet Place 1 tablet (0.4 mg total) under the tongue every 5 (five) minutes as needed for chest pain. 25 tablet 3    omeprazole (PRILOSEC) 20 MG capsule Take 1 capsule (20 mg total) by mouth daily. 90 capsule 2   Polyethyl Glycol-Propyl Glycol (SYSTANE OP) Place 1 drop into both eyes daily as needed (dry eyes).     Prenatal MV & Min w/FA-DHA (PRENATAL GUMMIES) 0.18-25 MG CHEW Chew 1 capsule by mouth daily.     Probiotic Product (ALIGN PO) Take 1 tablet by mouth daily.      Vitamin D, Ergocalciferol, (DRISDOL) 1.25 MG (50000 UNIT) CAPS capsule Take 1 capsule (50,000 Units total) by mouth every 10 days. 9 capsule 1   No current facility-administered medications for this visit.   Facility-Administered Medications Ordered in Other Visits  Medication Dose Route Frequency Provider Last Rate Last Admin   leuprolide (LUPRON) injection 3.75 mg  3.75 mg Intramuscular Once Heath Lark, MD        SUMMARY OF ONCOLOGIC HISTORY: Oncology History Overview Note  2018 - Core biopsy for ER/PR and Foundation One testing performed. Unfortunately, no sufficient tissue for Foundation One testing. ER was 50% and PR 90%. Progressed on carboplatin, exemestane, Avastin,Tamoxifen/Megace and letrozole, mixed response on Lupron  AM[facility]: 06/23/19: 108 05/26/19: 9.04 01/05/19: 6.84 09/02/18: 3.72 06/01/18: 3.84 02/24/18: 2.6 11/21/17: 3.26 08/26/17: 1.77 05/27/17: 2.12 01/28/17: 2.47 06/10/16: 2.68 02/06/16: 1.84 05/12/15: 3.27 10/03/14: 2.12  Inhibin B 09/22/2019: 95.9 05/26/19: 90.2 01/05/19: 92 09/02/18: 70.9 06/04/18: 70 02/24/18: 79.5 11/21/17: 85.8 08/26/17: 65.6 05/27/17: 58.9 01/28/17: 198 06/10/16: 118.1 02/06/16: 62.4 05/12/15: 64.6 10/03/14: 58   Malignant granulosa cell tumor of ovary ([place])  1994 Initial Diagnosis   1994   2002 Relapse/Recurrence   Upper abdominal recurrence, resected.     - 09/2000 Chemotherapy   6 cycles of IP cisplatin and etoposide    02/2009 Relapse/Recurrence   CT showed increased size of nodules in pelvis   2010 Surgery   Exlap with section of tumor nodules near cecum and left pelvic  sidewall. Tumor: ER negative, PR positive    Treatment Plan Change   Alternated 2 week courses of Megace and Tamoxifen - ended 03/2012   03/2012 PET scan   CT - progressive disease   2013 Treatment Plan Change   Letrozole   01/2016 Imaging   MRI showed progressive disease   2017 Treatment Plan Change   Two weeks of alternating Tamoxifen '20mg'$  daily and then Megace '40mg'$  TID   08/2016 Imaging   MRI showed progressive disease with peritoneal implants near liver, in pelvis   01/2017 Treatment Plan Change   Lupron 11.25 q 3 months   11/2017 Imaging   Overall mixed response.   Mixed cystic/solid lesions in the left pelvis are mildly improved.   Cystic peritoneal disease, including the dominant lesion along the posterior right hepatic lobe, is mildly progressed.   Subcapsular lesion along the posterior right hepatic lobe is unchanged.   03/2018 Imaging   MRI A/P: Mixed response of individual peritoneal metastases in the pelvis, as described above. Overall, there has been no significant change in bulk of disease.   Stable cystic peritoneal metastatic disease along the capsular surfaces of the liver and spleen.  No new sites of metastatic disease identified within the abdomen or pelvis.   08/2018 Imaging   MRI A/P: Status post hysterectomy and bilateral salpingo-oophorectomy.   Mixed cystic/solid peritoneal implants in the abdomen/pelvis, as above. Dominant cystic implant along the posterior liver surface is mildly increased. Remaining lesions are overall grossly unchanged.   No new lesions are identified.   01/28/2019 Imaging   Mri A/P: 1. Relatively similar appearance of peritoneal metastasis. A posterior right hepatic capsular based lesion is similar to minimally decreased in size. Left pelvic implants are primarily similar with possible enlargement of an anterior high left pelvic cystic implant. No new disease identified. 2.  Aortic Atherosclerosis (ICD10-I70.0). 3.  Hepatic steatosis. 4. Left adrenal adenoma.   06/02/2019 Imaging   MRI 1. Potential slight enlargement of dominant cystic area and solid component, associated with rind like signal variation on T2 along the inferior right hepatic margin, also potentially slightly increased. Findings may still be within the realm of measurement and technical variation. Close attention on follow-up. 2. Subtle cystic changes along the cephalad margin of the spleen are difficult to see on previous imaging, perhaps new compared with prior imaging studies. 3. Pelvic implants and left lower quadrant lesion with similar size, of the area along the left iliac vasculature may be slightly larger than on the prior study. 4. Signs of extensive retroperitoneal and pelvic lymphadenectomy. 5. Hepatic steatosis. 6. 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     06/08/2019 Cancer Staging   Staging form: [place], AJCC 7th Edition - Clinical: Stage IIIC (rT2, N1, M0) - Signed by Heath Lark, MD on 06/08/2019   06/10/2019 Imaging   1. 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 These findings are slightly worsened in comparison to a remote prior CT examination dated 10/04/2014.   2.  No evidence of metastatic disease in the chest.   3. Status post hysterectomy, pelvic and retroperitoneal lymph node dissection, and ventral hernia mesh repair.   4.  Hepatic steatosis.   5.  Aortic Atherosclerosis (ICD10-I70.0).   06/24/2019 - 09/02/2019 Chemotherapy   The patient had carboplatin for chemotherapy treatment.     09/02/2019 Tumor Marker   Patient's tumor was tested for the following markers: Inhibin B Results of the tumor marker test revealed 94   09/23/2019 Imaging   1. Interval progression of the soft tissue  lesions in the anterior left pelvis, likely peritoneal implants, compatible with disease progression. Remaining sites of apparent disease along the liver capsule and posterior spleen are stable 2. Stable 2 cm left adrenal adenoma. 3. Hepatic steatosis. 4. Right-side predominant colonic diverticulosis without diverticulitis. 5. Aortic Atherosclerosis (ICD10-I70.0).   12/23/2019 Imaging   1. Slight interval increase in size of a mixed solid and cystic nodule in the left hemipelvis measuring 3.0 x 2.9 cm, previously 2.7 x 2.1 cm. 2. Interval decrease in size of a nodule in the left paracolic gutter measuring 1.0 x 0.8 cm, previously 2.1 x 1.6 cm. 3. Stable peritoneal nodules in the hepatorenal recess and at the inferior tip of the spleen. 4. Unchanged nodule or lymph node overlying the left external iliac artery. 5. Enlargement of dominant left pelvic nodule is concerning for disease progression despite interval decrease in size of a nodule in the left paracolic gutter and stability of  other nodules. 6. No evidence of metastatic disease in the chest. 7. Stable, benign left adrenal adenoma. 8. Ventral hernia mesh repair with a small component of recurrent hernia inferiorly, containing a single nonobstructed loop of small bowel. 9. Hepatic steatosis. 10. Aortic Atherosclerosis (ICD10-I70.0).   12/23/2019 Tumor Marker   Patient's tumor was tested for the following markers: Inhibin B Results of the tumor marker test revealed 94.9   01/25/2020 Tumor Marker   Patient's tumor was tested for the following markers: Inhibin B Results of the tumor marker test revealed 116.7   02/20/2020 Genetic Testing   Negative genetic testing: no pathogenic variants detected in [facility] Multi-Cancer Panel.  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  The report date is February 20, 2020.   The Multi-Cancer Panel offered by [facility] includes sequencing and/or deletion duplication testing of the  following 85 genes: AIP, ALK, APC, ATM, AXIN2,BAP1,  BARD1, BLM, BMPR1A, BRCA1, BRCA2, BRIP1, CASR, CDC73, CDH1, CDK4, CDKN1B, CDKN1C, CDKN2A (p14ARF), CDKN2A (p16INK4a), CEBPA, CHEK2, CTNNA1, DICER1, DIS3L2, EGFR (c.2369C>T, p.Thr790Met variant only), EPCAM (Deletion/duplication testing only), FH, FLCN, GATA2, GPC3, GREM1 (Promoter region deletion/duplication testing only), HOXB13 (c.251G>A, p.Gly84Glu), HRAS, KIT, MAX, MEN1, MET, MITF (c.952G>A, p.Glu318Lys variant only), MLH1, MSH2, MSH3, MSH6, MUTYH, NBN, NF1, NF2, NTHL1, PALB2, PDGFRA, PHOX2B, PMS2, POLD1, POLE, POT1, PRKAR1A, PTCH1, PTEN, RAD50, RAD51C, RAD51D, RB1, RECQL4, RET, RNF43, RUNX1, SDHAF2, SDHA (sequence changes only), SDHB, SDHC, SDHD, SMAD4, SMARCA4, SMARCB1, SMARCE1, STK11, SUFU, TERC, TERT, TMEM127, TP53, TSC1, TSC2, VHL, WRN and WT1.    03/02/2020 Tumor Marker   Patient's tumor was tested for the following markers: Inhibin B Results of the tumor marker test revealed 80.8   04/17/2020 Imaging   1. Overall, exam is stable. Multiple peritoneal nodules are again seen. The index nodule in the left lower quadrant is mildly increased in size in the interval. The lesion within the anterior left pelvis has decreased in size in the interval. There has also been decrease in size of cystic lesion within the a hepatorenal recess. The remaining peritoneal lesions are unchanged. No new lesions identified. 2. Stable left adrenal nodule. 3.  Aortic Atherosclerosis (ICD10-I70.0).     08/21/2020 Imaging   Bone density is normal AP spine T score 0.8 Femoral neck on the left, T score -0.2 Femoral neck on the right ,T score -0.4   10/17/2020 Imaging   1. Interval progression of peritoneal nodules along the left pelvic sidewall, concerning for progressive metastatic disease. 2. Small capsular lesion medial right liver and the apparent cystic lesion superior to the right kidney are similar to prior. 3. Tiny soft tissue nodule anterior aspect of the  lateral left pelvis described as decreasing on the prior study has decreased further on today's exam. 4. Stable left adrenal nodule. This cannot be definitively characterized. 5. Tiny nonobstructing stone lower pole right kidney. 6. Aortic Atherosclerosis (ICD10-I70.0).   10/26/2020 Procedure   Successful placement of a right internal jugular approach power injectable Port-A-Cath. The catheter is ready for immediate use.       10/31/2020 - 10/04/2021 Chemotherapy   Patient is on Treatment Plan : OVARIAN Paclitaxel E1,7,40,81 q28d     01/25/2021 Imaging   1. Signs of peritoneal disease with scattered peritoneal nodules. The index lesions are either stable or decreased in size in the interval as detailed above. No new sites of disease. 2. Ventral pelvic wall hernia contains a nonobstructed loop of small bowel. 3. Aortic Atherosclerosis (ICD10-I70.0).     06/14/2021 Imaging  1. Multiple peritoneal metastatic nodules are slightly diminished in size. 2. A left external iliac lymph node or nodule is however stable to slightly enlarged. 3. Overall findings are most consistent with continued treatment response of metastatic disease. 4. No evidence of metastatic disease within the chest. 5. Hepatic steatosis.     06/14/2021 Imaging   Right: Findings consistent with 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   Left: No evidence of thrombosis in the subclavian.   06/20/2021 Procedure   Successful right IJ vein Port-A-Cath explant.     10/18/2021 Imaging   1. Similar to mild interval increase in size of left external iliac lymph node. 2. Multiple peritoneal metastatic nodules are grossly unchanged in size when compared to prior exam   02/14/2022 Imaging   1. Enlarging lymph node along the LEFT iliac chain, compatible with mild worsening of disease in this area. Stable stigmata of disease in the upper abdomen. 2. Post LEFT  retroperitoneal and pelvic lymphadenectomy. 3. Stable LEFT adrenal adenoma. 4. Stable 4 mm pulmonary nodule in the LEFT lung base. 5. Aortic atherosclerosis. 6. Mild hepatic steatosis. 7. Post abdominal wall reconstruction with hernia below the mesh as on previous imaging.     05/24/2022 Imaging   1. Exam is generally stable with no substantial interval change and no clear trend towards improving or progressive disease. There is one tiny soft tissue nodule along the medial capsule of the posterior right liver measuring 8 x 5 mm, more conspicuous than on the prior study. Close attention on follow-up recommended. 2. A second capsular implant along the medial liver capsule more anteriorly measures smaller on today's exam. Additional capsular implants along the medial liver and inferior spleen are stable. The left iliac lymph node identified as enlarging on the previous study is not substantially changed in the interval. 3. Stable 2 cm left adrenal nodule, previously characterized as adenoma. 4. Stable 4 mm left lower lobe pulmonary nodule. 5. Similar appearance of ventral hernia repair with recurrence. 6.  Aortic Atherosclerosis (ICD10-I70.0).     PHYSICAL EXAMINATION: ECOG PERFORMANCE STATUS: 0 - Asymptomatic  Vitals:   05/28/22 0833  BP: 124/83  Pulse: 67  Resp: 18  Temp: 98 F (36.7 C)  SpO2: 99%   Filed Weights   05/28/22 0833  Weight: 188 lb 6.4 oz (85.5 kg)    GENERAL:alert, no distress and comfortable  NEURO: alert & oriented x 3 with fluent speech, no focal motor/sensory deficits  LABORATORY DATA:  I have reviewed the data as listed    Component Value Date/Time   NA 139 05/27/2022 1519   NA 140 07/09/2021 0813   NA 141 09/24/2013 1131   K 4.1 05/27/2022 1519   K 4.5 09/24/2013 1131   CL 102 05/27/2022 1519   CO2 28 05/27/2022 1519   CO2 24 09/24/2013 1131   GLUCOSE 96 05/27/2022 1519   GLUCOSE 90 09/24/2013 1131   BUN 20 05/27/2022 1519   BUN 20 07/09/2021  0813   BUN 21.2 09/24/2013 1131   CREATININE 0.97 05/27/2022 1519   CREATININE 1.19 (H) 06/20/2021 1513   CREATININE 1.1 09/24/2013 1131   CALCIUM 9.0 05/27/2022 1519   CALCIUM 9.5 09/24/2013 1131   PROT 7.0 05/27/2022 1519   PROT 6.7 11/02/2021 0000   PROT 6.8 09/24/2013 1131   ALBUMIN 3.8 05/27/2022 1519   ALBUMIN 4.4 11/02/2021 0000   ALBUMIN 3.9 09/24/2013 1131   AST 18 05/27/2022 1519  AST 21 06/20/2021 1513   AST 23 09/24/2013 1131   ALT 15 05/27/2022 1519   ALT 19 06/20/2021 1513   ALT 27 09/24/2013 1131   ALKPHOS 76 05/27/2022 1519   ALKPHOS 81 09/24/2013 1131   BILITOT 0.9 05/27/2022 1519   BILITOT 0.4 11/02/2021 0000   BILITOT 0.4 06/20/2021 1513   BILITOT 0.53 09/24/2013 1131   GFRNONAA >60 05/27/2022 1519   GFRNONAA 51 (L) 06/20/2021 1513   GFRAA 55 (L) 01/24/2020 1530   GFRAA 57 (L) 12/21/2019 1259    No results found for: "SPEP", "UPEP"  Lab Results  Component Value Date   WBC 6.8 05/27/2022   NEUTROABS 3.8 05/27/2022   HGB 13.1 05/27/2022   HCT 38.0 05/27/2022   MCV 87.8 05/27/2022   PLT 284 05/27/2022      Chemistry      Component Value Date/Time   NA 139 05/27/2022 1519   NA 140 07/09/2021 0813   NA 141 09/24/2013 1131   K 4.1 05/27/2022 1519   K 4.5 09/24/2013 1131   CL 102 05/27/2022 1519   CO2 28 05/27/2022 1519   CO2 24 09/24/2013 1131   BUN 20 05/27/2022 1519   BUN 20 07/09/2021 0813   BUN 21.2 09/24/2013 1131   CREATININE 0.97 05/27/2022 1519   CREATININE 1.19 (H) 06/20/2021 1513   CREATININE 1.1 09/24/2013 1131      Component Value Date/Time   CALCIUM 9.0 05/27/2022 1519   CALCIUM 9.5 09/24/2013 1131   ALKPHOS 76 05/27/2022 1519   ALKPHOS 81 09/24/2013 1131   AST 18 05/27/2022 1519   AST 21 06/20/2021 1513   AST 23 09/24/2013 1131   ALT 15 05/27/2022 1519   ALT 19 06/20/2021 1513   ALT 27 09/24/2013 1131   BILITOT 0.9 05/27/2022 1519   BILITOT 0.4 11/02/2021 0000   BILITOT 0.4 06/20/2021 1513   BILITOT 0.53 09/24/2013  1131       RADIOGRAPHIC STUDIES: I have reviewed multiple imaging studies with the patient I have personally reviewed the radiological images as listed and agreed with the findings in the report. CT ABDOMEN PELVIS W CONTRAST  Result Date: 05/24/2022 CLINICAL DATA:  Ovarian cancer restaging.  * Tracking Code: BO * EXAM: CT ABDOMEN AND PELVIS WITH CONTRAST TECHNIQUE: Multidetector CT imaging of the abdomen and pelvis was performed using the standard protocol following bolus administration of intravenous contrast. RADIATION DOSE REDUCTION: This exam was performed according to the departmental dose-optimization program which includes automated exposure control, adjustment of the mA and/or kV according to patient size and/or use of iterative reconstruction technique. CONTRAST:  149m OMNIPAQUE IOHEXOL 300 MG/ML  SOLN COMPARISON:  02/12/2022 FINDINGS: Lower chest: 4 mm left lower lobe pulmonary nodule is stable. Hepatobiliary: No suspicious focal abnormality within the liver parenchyma. There is no evidence for gallstones, gallbladder wall thickening, or pericholecystic fluid. No intrahepatic or extrahepatic biliary dilation. Pancreas: Spleen: 14 mm exophytic low-density lesion identified along the inferior spleen previously is stable. Adrenals/Urinary Tract: Right adrenal gland unremarkable. Stable 2 cm left adrenal nodule, previously characterized as adenoma. No followup imaging is recommended. As above, 3.1 cm cyst in the right suprarenal space noted, rising from the medial right liver (coronal 55/5). Scattered tiny hypoattenuating lesions in both kidneys are too small to characterize but likely benign. No followup imaging is recommended. Stable 18 mm cyst interpolar left kidney. No followup imaging is recommended. No evidence for hydroureter. The urinary bladder appears normal for the degree of distention. Stomach/Bowel: Stomach  is unremarkable. No gastric wall thickening. No evidence of outlet obstruction.  Duodenum is normally positioned as is the ligament of Treitz. No small bowel wall thickening. No small bowel dilatation. The terminal ileum is normal. The appendix is not well visualized, but there is no edema or inflammation in the region of the cecum. No gross colonic mass. No colonic wall thickening. Scattered diverticuli evident without diverticulitis. Vascular/Lymphatic: There is mild atherosclerotic calcification of the abdominal aorta without aneurysm. There is no gastrohepatic or hepatoduodenal ligament lymphadenopathy. No retroperitoneal or mesenteric lymphadenopathy. Surgical clips along the left pelvic sidewall are compatible with prior lymph node dissection. Previously characterized left iliac node (62/2) is 15 mm short axis today compared to 14 mm previously. Reproductive: Hysterectomy.  There is no adnexal mass. Other: Stable 3.1 cm exophytic cystic lesion arising from the medial capsule of the right liver (6/7). The tiny soft tissue nodule along the medial capsule of the right liver measured previously at 7 mm is almost imperceptible today measuring about 3 mm on image 24/2. More posteriorly along the medial capsule of the right liver is a 8 x 5 mm nodule, more conspicuous than on the prior study (image 24/2). As above, 14 mm cystic lesion along the inferior capsule the spleen is unchanged in the interval. Musculoskeletal: No worrisome lytic or sclerotic osseous abnormality. Degenerative changes evident in both hips. Ventral mesh placement noted lower anterior abdominal wall with recurrent hernia inferior to the mesh containing small bowel loops without complicating features. IMPRESSION: 1. Exam is generally stable with no substantial interval change and no clear trend towards improving or progressive disease. There is one tiny soft tissue nodule along the medial capsule of the posterior right liver measuring 8 x 5 mm, more conspicuous than on the prior study. Close attention on follow-up recommended.  2. A second capsular implant along the medial liver capsule more anteriorly measures smaller on today's exam. Additional capsular implants along the medial liver and inferior spleen are stable. The left iliac lymph node identified as enlarging on the previous study is not substantially changed in the interval. 3. Stable 2 cm left adrenal nodule, previously characterized as adenoma. 4. Stable 4 mm left lower lobe pulmonary nodule. 5. Similar appearance of ventral hernia repair with recurrence. 6.  Aortic Atherosclerosis (ICD10-I70.0). Electronically Signed   By: Misty Stanley M.D.   On: 05/24/2022 06:51

## 2022-05-28 NOTE — Assessment & Plan Note (Signed)
I have reviewed multiple imaging studies with the patient Her CT imaging shows stability Overall, she tolerated treatment fairly well except for some intermittent mild hot flashes The plan will be to continue treatment indefinitely I plan to space out the interval of imaging study to every 6 months, her next imaging study will be due in August

## 2022-05-29 ENCOUNTER — Telehealth: Payer: Self-pay | Admitting: Hematology and Oncology

## 2022-05-29 NOTE — Telephone Encounter (Signed)
Left patient a vm regarding upcoming appointments

## 2022-05-30 DIAGNOSIS — R051 Acute cough: Secondary | ICD-10-CM | POA: Diagnosis not present

## 2022-05-30 DIAGNOSIS — R509 Fever, unspecified: Secondary | ICD-10-CM | POA: Diagnosis not present

## 2022-06-27 ENCOUNTER — Inpatient Hospital Stay: Payer: 59

## 2022-06-27 ENCOUNTER — Inpatient Hospital Stay (HOSPITAL_BASED_OUTPATIENT_CLINIC_OR_DEPARTMENT_OTHER): Payer: 59 | Admitting: Hematology and Oncology

## 2022-06-27 ENCOUNTER — Inpatient Hospital Stay: Payer: 59 | Attending: Hematology

## 2022-06-27 ENCOUNTER — Encounter: Payer: Self-pay | Admitting: Hematology and Oncology

## 2022-06-27 ENCOUNTER — Telehealth: Payer: Self-pay | Admitting: Internal Medicine

## 2022-06-27 VITALS — BP 133/72 | HR 81 | Temp 97.9°F | Resp 15 | Wt 186.4 lb

## 2022-06-27 DIAGNOSIS — C569 Malignant neoplasm of unspecified ovary: Secondary | ICD-10-CM | POA: Insufficient documentation

## 2022-06-27 DIAGNOSIS — C786 Secondary malignant neoplasm of retroperitoneum and peritoneum: Secondary | ICD-10-CM | POA: Insufficient documentation

## 2022-06-27 LAB — CBC WITH DIFFERENTIAL/PLATELET
Abs Immature Granulocytes: 0.02 10*3/uL (ref 0.00–0.07)
Basophils Absolute: 0.1 10*3/uL (ref 0.0–0.1)
Basophils Relative: 1 %
Eosinophils Absolute: 0.2 10*3/uL (ref 0.0–0.5)
Eosinophils Relative: 3 %
HCT: 42.9 % (ref 36.0–46.0)
Hemoglobin: 14.6 g/dL (ref 12.0–15.0)
Immature Granulocytes: 0 %
Lymphocytes Relative: 25 %
Lymphs Abs: 1.6 10*3/uL (ref 0.7–4.0)
MCH: 30 pg (ref 26.0–34.0)
MCHC: 34 g/dL (ref 30.0–36.0)
MCV: 88.3 fL (ref 80.0–100.0)
Monocytes Absolute: 0.6 10*3/uL (ref 0.1–1.0)
Monocytes Relative: 10 %
Neutro Abs: 4 10*3/uL (ref 1.7–7.7)
Neutrophils Relative %: 61 %
Platelets: 293 10*3/uL (ref 150–400)
RBC: 4.86 MIL/uL (ref 3.87–5.11)
RDW: 12.7 % (ref 11.5–15.5)
WBC: 6.4 10*3/uL (ref 4.0–10.5)
nRBC: 0 % (ref 0.0–0.2)

## 2022-06-27 LAB — COMPREHENSIVE METABOLIC PANEL
ALT: 16 U/L (ref 0–44)
AST: 20 U/L (ref 15–41)
Albumin: 4.4 g/dL (ref 3.5–5.0)
Alkaline Phosphatase: 87 U/L (ref 38–126)
Anion gap: 9 (ref 5–15)
BUN: 22 mg/dL (ref 8–23)
CO2: 29 mmol/L (ref 22–32)
Calcium: 9.7 mg/dL (ref 8.9–10.3)
Chloride: 103 mmol/L (ref 98–111)
Creatinine, Ser: 1.07 mg/dL — ABNORMAL HIGH (ref 0.44–1.00)
GFR, Estimated: 58 mL/min — ABNORMAL LOW (ref >60)
Glucose, Bld: 77 mg/dL (ref 70–99)
Potassium: 4.1 mmol/L (ref 3.5–5.1)
Sodium: 141 mmol/L (ref 135–145)
Total Bilirubin: 0.5 mg/dL (ref 0.3–1.2)
Total Protein: 7.3 g/dL (ref 6.5–8.1)

## 2022-06-27 MED ORDER — LEUPROLIDE ACETATE 3.75 MG IM KIT
3.7500 mg | PACK | Freq: Once | INTRAMUSCULAR | Status: AC
  Administered 2022-06-27: 3.75 mg via INTRAMUSCULAR
  Filled 2022-06-27: qty 3.75

## 2022-06-27 NOTE — Telephone Encounter (Signed)
Per 3/7 LOS reached out to schedule appointments. Left voicemail for patient to call back to confirm or reschedule.

## 2022-06-27 NOTE — Progress Notes (Signed)
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   All questions were answered. The patient knows to call the clinic with any problems, questions or concerns. The total time spent in the appointment was 20 minutes encounter with patients including review of chart and various tests results, discussions about plan of care and coordination of care plan   Heath Lark, MD 06/27/2022 9:46 AM  INTERVAL HISTORY: Please see below for problem oriented charting. she returns for treatment follow-up on chronic antiestrogen therapy for malignant granulosa cell of tumor of the ovary She tolerated recent treatment well She has intermittent hot flashes but tolerable Denies recent nausea  REVIEW OF SYSTEMS:   Constitutional: Denies fevers, chills or abnormal weight loss Eyes: Denies blurriness of vision Ears, nose, mouth, throat, and face: Denies mucositis or sore throat Respiratory: Denies cough, dyspnea or wheezes Cardiovascular: Denies palpitation, chest discomfort or lower extremity swelling Gastrointestinal:  Denies nausea, heartburn or change in bowel habits Skin: Denies abnormal skin rashes Lymphatics: Denies new lymphadenopathy or easy bruising Neurological:Denies numbness, tingling or new  weaknesses Behavioral/Psych: Mood is stable, no new changes  All other systems were reviewed with the patient and are negative.  I have reviewed the past medical history, past surgical history, social history and family history with the patient and they are unchanged from previous note.  ALLERGIES:  is allergic to codeine, thimerosal (thiomersal), ciprofloxacin, daypro [oxaprozin], levofloxacin, stadol [butorphanol tartrate], and sulfa antibiotics.  MEDICATIONS:  Current Outpatient Medications  Medication Sig Dispense Refill   acetaminophen (TYLENOL) 500 MG tablet Take 1,000 mg by mouth every 6 (six) hours as needed for moderate pain.     anastrozole (ARIMIDEX) 1 MG tablet Take 1 tablet (1 mg total) by mouth daily. 90 tablet 3   aspirin EC 81 MG tablet Take 81 mg by mouth daily. Swallow whole.     atorvastatin (LIPITOR) 40 MG tablet Take 1 tablet (40 mg total) by mouth daily. 90 tablet 3   Biotin 5 MG TABS Take 5 mg by mouth daily.     buPROPion (WELLBUTRIN SR) 200 MG 12 hr tablet Take 1 tablet (200 mg total) by mouth 2 (two) times daily. 180 tablet 1   Ca Phosphate-Cholecalciferol (EQL CALCIUM GUMMIES) 250-400 MG-UNIT CHEW Chew 1 tablet by mouth 2 (two) times daily.     escitalopram (LEXAPRO) 20 MG tablet Take 1 tablet (20 mg total) by mouth daily. 90 tablet 2   gabapentin (NEURONTIN) 300 MG capsule Take 1 capsule (300 mg total) by mouth 2 (two) times daily. 60 capsule 3   leuprolide (LUPRON) 30 MG injection Inject 30 mg into the muscle every 30 (thirty) days.     metFORMIN (GLUCOPHAGE) 500 MG tablet Take 1 tablet (500 mg total) by mouth every morning AND 2 tablets (1,000 mg  total) every evening. Needs office visit. 270 tablet 1   mirabegron ER (MYRBETRIQ) 50 MG TB24 tablet Take 1 tablet (50 mg total) by mouth daily. 30 tablet 11   nitroGLYCERIN (NITROSTAT) 0.4 MG SL tablet Place 1 tablet (0.4 mg total) under the tongue every 5 (five) minutes as needed for chest pain. 25 tablet 3    omeprazole (PRILOSEC) 20 MG capsule Take 1 capsule (20 mg total) by mouth daily. 90 capsule 2   Polyethyl Glycol-Propyl Glycol (SYSTANE OP) Place 1 drop into both eyes daily as needed (dry eyes).     Prenatal MV & Min w/FA-DHA (PRENATAL GUMMIES) 0.18-25 MG CHEW Chew 1 capsule by mouth daily.     Probiotic Product (ALIGN PO) Take 1 tablet by mouth daily.      psyllium (METAMUCIL) 58.6 % powder Take 1 packet by mouth 2 (two) times daily.     Vitamin D, Ergocalciferol, (DRISDOL) 1.25 MG (50000 UNIT) CAPS capsule Take 1 capsule (50,000 Units total) by mouth every 10 days. 9 capsule 1   No current facility-administered medications for this visit.   Facility-Administered Medications Ordered in Other Visits  Medication Dose Route Frequency Provider Last Rate Last Admin   leuprolide (LUPRON) injection 3.75 mg  3.75 mg Intramuscular Once Heath Lark, MD        SUMMARY OF ONCOLOGIC HISTORY: Oncology History Overview Note  2018 - Core biopsy for ER/PR and Foundation One testing performed. Unfortunately, no sufficient tissue for Foundation One testing. ER was 50% and PR 90%. Progressed on carboplatin, exemestane, Avastin,Tamoxifen/Megace and letrozole, mixed response on Lupron  [facility]: 06/23/19: 108 05/26/19: 9.04 01/05/19: 6.84 09/02/18: 3.72 06/01/18: 3.84 02/24/18: 2.6 11/21/17: 3.26 08/26/17: 1.77 05/27/17: 2.12 01/28/17: 2.47 06/10/16: 2.68 02/06/16: 1.84 05/12/15: 3.27 10/03/14: 2.12  Inhibin B 09/22/2019: 95.9 05/26/19: 90.2 01/05/19: 92 09/02/18: 70.9 06/04/18: 70 02/24/18: 79.5 11/21/17: 85.8 08/26/17: 65.6 05/27/17: 58.9 01/28/17: 198 06/10/16: 118.1 02/06/16: 62.4 05/12/15: 64.6 10/03/14: 58   Malignant granulosa cell tumor of ovary ([place])  1994 Initial Diagnosis   1994   2002 Relapse/Recurrence   Upper abdominal recurrence, resected.     - 09/2000 Chemotherapy   6 cycles of IP cisplatin and etoposide    02/2009 Relapse/Recurrence   CT showed increased size of nodules in pelvis   2010 Surgery    Exlap with section of tumor nodules near cecum and left pelvic sidewall. Tumor: ER negative, PR positive    Treatment Plan Change   Alternated 2 week courses of Megace and Tamoxifen - ended 03/2012   03/2012 PET scan   CT - progressive disease   2013 Treatment Plan Change   Letrozole   01/2016 Imaging   MRI showed progressive disease   2017 Treatment Plan Change   Two weeks of alternating Tamoxifen '20mg'$  daily and then Megace '40mg'$  TID   08/2016 Imaging   MRI showed progressive disease with peritoneal implants near liver, in pelvis   01/2017 Treatment Plan Change   Lupron 11.25 q 3 months   11/2017 Imaging   Overall mixed response.   Mixed cystic/solid lesions in the left pelvis are mildly improved.   Cystic peritoneal disease, including the dominant lesion along the posterior right hepatic lobe, is mildly progressed.   Subcapsular lesion along the posterior right hepatic lobe is unchanged.   03/2018 Imaging   MRI A/P: Mixed response of individual peritoneal metastases in the pelvis, as described above. Overall, there has been no significant change in bulk of disease.   Stable cystic peritoneal  metastatic disease along the capsular surfaces of the liver and spleen.   No new sites of metastatic disease identified within the abdomen or pelvis.   08/2018 Imaging   MRI A/P: Status post hysterectomy and bilateral salpingo-oophorectomy.   Mixed cystic/solid peritoneal implants in the abdomen/pelvis, as above. Dominant cystic implant along the posterior liver surface is mildly increased. Remaining lesions are overall grossly unchanged.   No new lesions are identified.   01/28/2019 Imaging   Mri A/P: 1. Relatively similar appearance of peritoneal metastasis. A posterior right hepatic capsular based lesion is similar to minimally decreased in size. Left pelvic implants are primarily similar with possible enlargement of an anterior high left pelvic cystic implant. No new disease  identified. 2.  Aortic Atherosclerosis (ICD10-I70.0). 3. Hepatic steatosis. 4. Left adrenal adenoma.   06/02/2019 Imaging   MRI 1. Potential slight enlargement of dominant cystic area and solid component, associated with rind like signal variation on T2 along the inferior right hepatic margin, also potentially slightly increased. Findings may still be within the realm of measurement and technical variation. Close attention on follow-up. 2. Subtle cystic changes along the cephalad margin of the spleen are difficult to see on previous imaging, perhaps new compared with prior imaging studies. 3. Pelvic implants and left lower quadrant lesion with similar size, of the area along the left iliac vasculature may be slightly larger than on the prior study. 4. Signs of extensive retroperitoneal and pelvic lymphadenectomy. 5. Hepatic steatosis. 6. 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     06/08/2019 Cancer Staging   Staging form: [place], AJCC 7th Edition - Clinical: Stage IIIC (rT2, N1, M0) - Signed by Heath Lark, MD on 06/08/2019   06/10/2019 Imaging   1. 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 These findings are slightly worsened in comparison to a remote prior CT examination dated 10/04/2014.   2.  No evidence of metastatic disease in the chest.   3. Status post hysterectomy, pelvic and retroperitoneal lymph node dissection, and ventral hernia mesh repair.   4.  Hepatic steatosis.   5.  Aortic Atherosclerosis (ICD10-I70.0).   06/24/2019 - 09/02/2019 Chemotherapy   The patient had carboplatin for chemotherapy treatment.     09/02/2019 Tumor Marker   Patient's tumor was tested for the following markers: Inhibin B Results of the tumor marker test revealed 94    09/23/2019 Imaging   1. Interval progression of the soft tissue lesions in the anterior left pelvis, likely peritoneal implants, compatible with disease progression. Remaining sites of apparent disease along the liver capsule and posterior spleen are stable 2. Stable 2 cm left adrenal adenoma. 3. Hepatic steatosis. 4. Right-side predominant colonic diverticulosis without diverticulitis. 5. Aortic Atherosclerosis (ICD10-I70.0).   12/23/2019 Imaging   1. Slight interval increase in size of a mixed solid and cystic nodule in the left hemipelvis measuring 3.0 x 2.9 cm, previously 2.7 x 2.1 cm. 2. Interval decrease in size of a nodule in the left paracolic gutter measuring 1.0 x 0.8 cm, previously 2.1 x 1.6 cm. 3. Stable peritoneal nodules in the hepatorenal recess and at the inferior tip of the spleen. 4. Unchanged nodule or lymph node overlying the left external iliac artery. 5. Enlargement of dominant left pelvic nodule is concerning for disease progression despite interval decrease  in size of a nodule in the left paracolic gutter and stability of other nodules. 6. No evidence of metastatic disease in the chest. 7. Stable, benign left adrenal adenoma. 8. Ventral hernia mesh repair with a small component of recurrent hernia inferiorly, containing a single nonobstructed loop of small bowel. 9. Hepatic steatosis. 10. Aortic Atherosclerosis (ICD10-I70.0).   12/23/2019 Tumor Marker   Patient's tumor was tested for the following markers: Inhibin B Results of the tumor marker test revealed 94.9   01/25/2020 Tumor Marker   Patient's tumor was tested for the following markers: Inhibin B Results of the tumor marker test revealed 116.7   02/20/2020 Genetic Testing   Negative genetic testing: no pathogenic variants detected in [facility] Multi-Cancer Panel.  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  The report date is February 20, 2020.   The Multi-Cancer Panel offered by [facility]  includes sequencing and/or deletion duplication testing of the following 85 genes: AIP, ALK, APC, ATM, AXIN2,BAP1,  BARD1, BLM, BMPR1A, BRCA1, BRCA2, BRIP1, CASR, CDC73, CDH1, CDK4, CDKN1B, CDKN1C, CDKN2A (p14ARF), CDKN2A (p16INK4a), CEBPA, CHEK2, CTNNA1, DICER1, DIS3L2, EGFR (c.2369C>T, p.Thr790Met variant only), EPCAM (Deletion/duplication testing only), FH, FLCN, GATA2, GPC3, GREM1 (Promoter region deletion/duplication testing only), HOXB13 (c.251G>A, p.Gly84Glu), HRAS, KIT, MAX, MEN1, MET, MITF (c.952G>A, p.Glu318Lys variant only), MLH1, MSH2, MSH3, MSH6, MUTYH, NBN, NF1, NF2, NTHL1, PALB2, PDGFRA, PHOX2B, PMS2, POLD1, POLE, POT1, PRKAR1A, PTCH1, PTEN, RAD50, RAD51C, RAD51D, RB1, RECQL4, RET, RNF43, RUNX1, SDHAF2, SDHA (sequence changes only), SDHB, SDHC, SDHD, SMAD4, SMARCA4, SMARCB1, SMARCE1, STK11, SUFU, TERC, TERT, TMEM127, TP53, TSC1, TSC2, VHL, WRN and WT1.    03/02/2020 Tumor Marker   Patient's tumor was tested for the following markers: Inhibin B Results of the tumor marker test revealed 80.8   04/17/2020 Imaging   1. Overall, exam is stable. Multiple peritoneal nodules are again seen. The index nodule in the left lower quadrant is mildly increased in size in the interval. The lesion within the anterior left pelvis has decreased in size in the interval. There has also been decrease in size of cystic lesion within the a hepatorenal recess. The remaining peritoneal lesions are unchanged. No new lesions identified. 2. Stable left adrenal nodule. 3.  Aortic Atherosclerosis (ICD10-I70.0).     08/21/2020 Imaging   Bone density is normal AP spine T score 0.8 Femoral neck on the left, T score -0.2 Femoral neck on the right ,T score -0.4   10/17/2020 Imaging   1. Interval progression of peritoneal nodules along the left pelvic sidewall, concerning for progressive metastatic disease. 2. Small capsular lesion medial right liver and the apparent cystic lesion superior to the right kidney are similar  to prior. 3. Tiny soft tissue nodule anterior aspect of the lateral left pelvis described as decreasing on the prior study has decreased further on today's exam. 4. Stable left adrenal nodule. This cannot be definitively characterized. 5. Tiny nonobstructing stone lower pole right kidney. 6. Aortic Atherosclerosis (ICD10-I70.0).   10/26/2020 Procedure   Successful placement of a right internal jugular approach power injectable Port-A-Cath. The catheter is ready for immediate use.       10/31/2020 - 10/04/2021 Chemotherapy   Patient is on Treatment Plan : OVARIAN Paclitaxel [id number] q28d     01/25/2021 Imaging   1. Signs of peritoneal disease with scattered peritoneal nodules. The index lesions are either stable or decreased in size in the interval as detailed above. No new sites of disease. 2. Ventral pelvic wall hernia contains a nonobstructed loop of  small bowel. 3. Aortic Atherosclerosis (ICD10-I70.0).     06/14/2021 Imaging   1. Multiple peritoneal metastatic nodules are slightly diminished in size. 2. A left external iliac lymph node or nodule is however stable to slightly enlarged. 3. Overall findings are most consistent with continued treatment response of metastatic disease. 4. No evidence of metastatic disease within the chest. 5. Hepatic steatosis.     06/14/2021 Imaging   Right: Findings consistent with 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   Left: No evidence of thrombosis in the subclavian.   06/20/2021 Procedure   Successful right IJ vein Port-A-Cath explant.     10/18/2021 Imaging   1. Similar to mild interval increase in size of left external iliac lymph node. 2. Multiple peritoneal metastatic nodules are grossly unchanged in size when compared to prior exam   02/14/2022 Imaging   1. Enlarging lymph node along the LEFT iliac chain, compatible with mild worsening of disease in this area. Stable  stigmata of disease in the upper abdomen. 2. Post LEFT retroperitoneal and pelvic lymphadenectomy. 3. Stable LEFT adrenal adenoma. 4. Stable 4 mm pulmonary nodule in the LEFT lung base. 5. Aortic atherosclerosis. 6. Mild hepatic steatosis. 7. Post abdominal wall reconstruction with hernia below the mesh as on previous imaging.     05/24/2022 Imaging   1. Exam is generally stable with no substantial interval change and no clear trend towards improving or progressive disease. There is one tiny soft tissue nodule along the medial capsule of the posterior right liver measuring 8 x 5 mm, more conspicuous than on the prior study. Close attention on follow-up recommended. 2. A second capsular implant along the medial liver capsule more anteriorly measures smaller on today's exam. Additional capsular implants along the medial liver and inferior spleen are stable. The left iliac lymph node identified as enlarging on the previous study is not substantially changed in the interval. 3. Stable 2 cm left adrenal nodule, previously characterized as adenoma. 4. Stable 4 mm left lower lobe pulmonary nodule. 5. Similar appearance of ventral hernia repair with recurrence. 6.  Aortic Atherosclerosis (ICD10-I70.0).     PHYSICAL EXAMINATION: ECOG PERFORMANCE STATUS: 1 - Symptomatic but completely ambulatory  Vitals:   06/27/22 0828  BP: 133/72  Pulse: 81  Resp: 15  Temp: 97.9 F (36.6 C)  SpO2: 99%   Filed Weights   06/27/22 0828  Weight: 186 lb 6.4 oz (84.6 kg)    GENERAL:alert, no distress and comfortable  NEURO: alert & oriented x 3 with fluent speech, no focal motor/sensory deficits  LABORATORY DATA:  I have reviewed the data as listed    Component Value Date/Time   NA 141 06/27/2022 0801   NA 140 07/09/2021 0813   NA 141 09/24/2013 1131   K 4.1 06/27/2022 0801   K 4.5 09/24/2013 1131   CL 103 06/27/2022 0801   CO2 29 06/27/2022 0801   CO2 24 09/24/2013 1131   GLUCOSE 77 06/27/2022  0801   GLUCOSE 90 09/24/2013 1131   BUN 22 06/27/2022 0801   BUN 20 07/09/2021 0813   BUN 21.2 09/24/2013 1131   CREATININE 1.07 (H) 06/27/2022 0801   CREATININE 1.19 (H) 06/20/2021 1513   CREATININE 1.1 09/24/2013 1131   CALCIUM 9.7 06/27/2022 0801   CALCIUM 9.5 09/24/2013 1131   PROT 7.3 06/27/2022 0801   PROT 6.7 11/02/2021 0000   PROT 6.8 09/24/2013 1131   ALBUMIN 4.4 06/27/2022 0801  ALBUMIN 4.4 11/02/2021 0000   ALBUMIN 3.9 09/24/2013 1131   AST 20 06/27/2022 0801   AST 21 06/20/2021 1513   AST 23 09/24/2013 1131   ALT 16 06/27/2022 0801   ALT 19 06/20/2021 1513   ALT 27 09/24/2013 1131   ALKPHOS 87 06/27/2022 0801   ALKPHOS 81 09/24/2013 1131   BILITOT 0.5 06/27/2022 0801   BILITOT 0.4 11/02/2021 0000   BILITOT 0.4 06/20/2021 1513   BILITOT 0.53 09/24/2013 1131   GFRNONAA 58 (L) 06/27/2022 0801   GFRNONAA 51 (L) 06/20/2021 1513   GFRAA 55 (L) 01/24/2020 1530   GFRAA 57 (L) 12/21/2019 1259    No results found for: "SPEP", "UPEP"  Lab Results  Component Value Date   WBC 6.4 06/27/2022   NEUTROABS 4.0 06/27/2022   HGB 14.6 06/27/2022   HCT 42.9 06/27/2022   MCV 88.3 06/27/2022   PLT 293 06/27/2022      Chemistry      Component Value Date/Time   NA 141 06/27/2022 0801   NA 140 07/09/2021 0813   NA 141 09/24/2013 1131   K 4.1 06/27/2022 0801   K 4.5 09/24/2013 1131   CL 103 06/27/2022 0801   CO2 29 06/27/2022 0801   CO2 24 09/24/2013 1131   BUN 22 06/27/2022 0801   BUN 20 07/09/2021 0813   BUN 21.2 09/24/2013 1131   CREATININE 1.07 (H) 06/27/2022 0801   CREATININE 1.19 (H) 06/20/2021 1513   CREATININE 1.1 09/24/2013 1131      Component Value Date/Time   CALCIUM 9.7 06/27/2022 0801   CALCIUM 9.5 09/24/2013 1131   ALKPHOS 87 06/27/2022 0801   ALKPHOS 81 09/24/2013 1131   AST 20 06/27/2022 0801   AST 21 06/20/2021 1513   AST 23 09/24/2013 1131   ALT 16 06/27/2022 0801   ALT 19 06/20/2021 1513   ALT 27 09/24/2013 1131   BILITOT 0.5 06/27/2022  0801   BILITOT 0.4 11/02/2021 0000   BILITOT 0.4 06/20/2021 1513   BILITOT 0.53 09/24/2013 1131

## 2022-06-27 NOTE — Patient Instructions (Signed)
Leuprolide Solution for Injection What is this medication? LEUPROLIDE (loo PROE lide) reduces the symptoms of prostate cancer. It works by decreasing levels of the hormone testosterone in the body. This prevents prostate cancer cells from spreading or growing. This medicine may be used for other purposes; ask your health care provider or pharmacist if you have questions. COMMON BRAND NAME(S): Lupron What should I tell my care team before I take this medication? They need to know if you have any of these conditions: Diabetes Heart attack Heart disease High blood pressure High cholesterol Pain or difficulty passing urine Spinal cord metastasis Stroke Tobacco use An unusual or allergic reaction to leuprolide, other medications, foods, dyes, or preservatives Pregnant or trying to get pregnant Breast-feeding How should I use this medication? This medication is for injection under the skin or into a muscle. You will be taught how to prepare and give this medication. Use exactly as directed. Take your medication at regular intervals. Do not take it more often than directed. It is important that you put your used needles and syringes in a special sharps container. Do not put them in a trash can. If you do not have a sharps container, call your care team to get one. A special MedGuide will be given to you by the pharmacist with each prescription and refill. Be sure to read this information carefully each time. Talk to your care team about the use of this medication in children. While this medication may be prescribed for children as young as 8 years for selected conditions, precautions do apply. Overdosage: If you think you have taken too much of this medicine contact a poison control center or emergency room at once. NOTE: This medicine is only for you. Do not share this medicine with others. What if I miss a dose? If you miss a dose, take it as soon as you can. If it is almost time for your next  dose, take only that dose. Do not take double or extra doses. What may interact with this medication? Do not take this medication with any of the following: Chasteberry Cisapride Dronedarone Pimozide Thioridazine This medication may also interact with the following: Estrogen or progestin hormones Herbal or dietary supplements, like black cohosh or DHEA Other medications that cause heart rhythm changes Testosterone This list may not describe all possible interactions. Give your health care provider a list of all the medicines, herbs, non-prescription drugs, or dietary supplements you use. Also tell them if you smoke, drink alcohol, or use illegal drugs. Some items may interact with your medicine. What should I watch for while using this medication? Visit your care team for regular checks on your progress. During the first week, your symptoms may get worse, but then will improve as you continue your treatment. You may get hot flashes, increased bone pain, increased difficulty passing urine, or an aggravation of nerve symptoms. Discuss these effects with your care team, some of them may improve with continued use of this medication. Patients may experience a menstrual cycle or spotting during the first 2 months of therapy with this medication. If this continues, contact your care team. This medication may increase blood sugar. The risk may be higher in patients who already have diabetes. Ask your care team what you can do to lower your risk of diabetes while taking this medication. What side effects may I notice from receiving this medication? Side effects that you should report to your care team as soon as possible: Allergic reactions--skin rash,  itching, hives, swelling of the face, lips, tongue, or throat Heart attack--pain or tightness in the chest, shoulders, arms, or jaw, nausea, shortness of breath, cold or clammy skin, feeling faint or lightheaded Heart rhythm changes--fast or irregular  heartbeat, dizziness, feeling faint or lightheaded, chest pain, trouble breathing High blood sugar (hyperglycemia)--increased thirst or amount of urine, unusual weakness or fatigue, blurry vision Mood swings, irritability, hostility Seizures Stroke--sudden numbness or weakness of the face, arm, or leg, trouble speaking, confusion, trouble walking, loss of balance or coordination, dizziness, severe headache, change in vision Thoughts of suicide or self-harm, worsening mood, feelings of depression Side effects that usually do not require medical attention (report to your care team if they continue or are bothersome): Bone pain Change in sex drive or performance General discomfort and fatigue Hot flashes Muscle pain Pain, redness, or irritation at injection site Swelling of the ankles, hands, or feet This list may not describe all possible side effects. Call your doctor for medical advice about side effects. You may report side effects to FDA at 1-800-FDA-1088. Where should I keep my medication? Keep out of the reach of children and pets. Store below 25 degrees C (77 degrees F). Do not freeze. Protect from light. Get rid of any unused medication after the expiration date. To get rid of medications that are no longer needed or have expired: Take the medication to a medication take-back program. Check with your pharmacy or law enforcement to find a location. If you cannot return the medication, ask your pharmacist or care team how to get rid of this medication safely. NOTE: This sheet is a summary. It may not cover all possible information. If you have questions about this medicine, talk to your doctor, pharmacist, or health care provider.  2023 [employer] (2021-05-24 00:00:00)

## 2022-06-27 NOTE — Assessment & Plan Note (Addendum)
I have reviewed multiple imaging studies with the patient Her CT imaging shows stability Overall, she tolerated treatment fairly well except for some intermittent mild hot flashes The plan will be to continue treatment indefinitely I plan to space out the interval of imaging study to every 6 months, her next imaging study will be due in August  Due to chronic antiestrogen therapy, I recommend the patient to get bone density scan repeated in May 2024

## 2022-07-04 ENCOUNTER — Other Ambulatory Visit (HOSPITAL_COMMUNITY): Payer: Self-pay

## 2022-07-05 ENCOUNTER — Other Ambulatory Visit: Payer: Self-pay

## 2022-07-14 ENCOUNTER — Other Ambulatory Visit: Payer: Self-pay

## 2022-07-14 ENCOUNTER — Encounter (INDEPENDENT_AMBULATORY_CARE_PROVIDER_SITE_OTHER): Payer: Self-pay | Admitting: Family Medicine

## 2022-07-14 ENCOUNTER — Encounter: Payer: Self-pay | Admitting: Hematology and Oncology

## 2022-07-15 ENCOUNTER — Other Ambulatory Visit: Payer: Self-pay

## 2022-07-15 ENCOUNTER — Other Ambulatory Visit (HOSPITAL_COMMUNITY): Payer: Self-pay

## 2022-07-15 ENCOUNTER — Encounter (HOSPITAL_COMMUNITY): Payer: Self-pay

## 2022-07-16 ENCOUNTER — Other Ambulatory Visit (INDEPENDENT_AMBULATORY_CARE_PROVIDER_SITE_OTHER): Payer: Self-pay

## 2022-07-16 ENCOUNTER — Other Ambulatory Visit: Payer: Self-pay

## 2022-07-16 ENCOUNTER — Encounter: Payer: Self-pay | Admitting: Hematology and Oncology

## 2022-07-16 DIAGNOSIS — F3289 Other specified depressive episodes: Secondary | ICD-10-CM

## 2022-07-16 MED ORDER — BUPROPION HCL ER (SR) 200 MG PO TB12
200.0000 mg | ORAL_TABLET | Freq: Two times a day (BID) | ORAL | 1 refills | Status: DC
  Filled 2022-07-16: qty 180, 90d supply, fill #0

## 2022-07-19 ENCOUNTER — Other Ambulatory Visit: Payer: Self-pay

## 2022-07-19 ENCOUNTER — Other Ambulatory Visit (HOSPITAL_COMMUNITY): Payer: Self-pay

## 2022-07-23 ENCOUNTER — Telehealth: Payer: Self-pay | Admitting: *Deleted

## 2022-07-23 ENCOUNTER — Encounter: Payer: Self-pay | Admitting: Hematology and Oncology

## 2022-07-23 NOTE — Telephone Encounter (Signed)
Patient scheduled for  a new patient appt

## 2022-07-25 ENCOUNTER — Inpatient Hospital Stay: Payer: 59 | Attending: Hematology

## 2022-07-25 DIAGNOSIS — C569 Malignant neoplasm of unspecified ovary: Secondary | ICD-10-CM | POA: Insufficient documentation

## 2022-07-25 DIAGNOSIS — Z79818 Long term (current) use of other agents affecting estrogen receptors and estrogen levels: Secondary | ICD-10-CM | POA: Diagnosis not present

## 2022-07-25 DIAGNOSIS — Z9221 Personal history of antineoplastic chemotherapy: Secondary | ICD-10-CM | POA: Insufficient documentation

## 2022-07-25 DIAGNOSIS — Z90710 Acquired absence of both cervix and uterus: Secondary | ICD-10-CM | POA: Diagnosis not present

## 2022-07-25 DIAGNOSIS — R0781 Pleurodynia: Secondary | ICD-10-CM | POA: Diagnosis not present

## 2022-07-25 DIAGNOSIS — R232 Flushing: Secondary | ICD-10-CM | POA: Diagnosis not present

## 2022-07-25 DIAGNOSIS — Z79811 Long term (current) use of aromatase inhibitors: Secondary | ICD-10-CM | POA: Insufficient documentation

## 2022-07-25 DIAGNOSIS — Z90722 Acquired absence of ovaries, bilateral: Secondary | ICD-10-CM | POA: Insufficient documentation

## 2022-07-25 DIAGNOSIS — K660 Peritoneal adhesions (postprocedural) (postinfection): Secondary | ICD-10-CM | POA: Insufficient documentation

## 2022-07-25 DIAGNOSIS — C786 Secondary malignant neoplasm of retroperitoneum and peritoneum: Secondary | ICD-10-CM | POA: Diagnosis not present

## 2022-07-25 LAB — COMPREHENSIVE METABOLIC PANEL
ALT: 13 U/L (ref 0–44)
AST: 15 U/L (ref 15–41)
Albumin: 4.1 g/dL (ref 3.5–5.0)
Alkaline Phosphatase: 84 U/L (ref 38–126)
Anion gap: 6 (ref 5–15)
BUN: 22 mg/dL (ref 8–23)
CO2: 29 mmol/L (ref 22–32)
Calcium: 9.8 mg/dL (ref 8.9–10.3)
Chloride: 103 mmol/L (ref 98–111)
Creatinine, Ser: 0.99 mg/dL (ref 0.44–1.00)
GFR, Estimated: >60 mL/min (ref >60)
Glucose, Bld: 91 mg/dL (ref 70–99)
Potassium: 4.5 mmol/L (ref 3.5–5.1)
Sodium: 138 mmol/L (ref 135–145)
Total Bilirubin: 0.4 mg/dL (ref 0.3–1.2)
Total Protein: 6.9 g/dL (ref 6.5–8.1)

## 2022-07-25 LAB — CBC WITH DIFFERENTIAL/PLATELET
Abs Immature Granulocytes: 0.01 10*3/uL (ref 0.00–0.07)
Basophils Absolute: 0.1 10*3/uL (ref 0.0–0.1)
Basophils Relative: 1 %
Eosinophils Absolute: 0.1 10*3/uL (ref 0.0–0.5)
Eosinophils Relative: 2 %
HCT: 40.1 % (ref 36.0–46.0)
Hemoglobin: 13.4 g/dL (ref 12.0–15.0)
Immature Granulocytes: 0 %
Lymphocytes Relative: 30 %
Lymphs Abs: 1.9 10*3/uL (ref 0.7–4.0)
MCH: 29.6 pg (ref 26.0–34.0)
MCHC: 33.4 g/dL (ref 30.0–36.0)
MCV: 88.5 fL (ref 80.0–100.0)
Monocytes Absolute: 0.6 10*3/uL (ref 0.1–1.0)
Monocytes Relative: 10 %
Neutro Abs: 3.7 10*3/uL (ref 1.7–7.7)
Neutrophils Relative %: 57 %
Platelets: 299 10*3/uL (ref 150–400)
RBC: 4.53 MIL/uL (ref 3.87–5.11)
RDW: 12.4 % (ref 11.5–15.5)
WBC: 6.4 10*3/uL (ref 4.0–10.5)
nRBC: 0 % (ref 0.0–0.2)

## 2022-07-26 ENCOUNTER — Inpatient Hospital Stay: Payer: 59 | Admitting: Hematology and Oncology

## 2022-07-26 ENCOUNTER — Inpatient Hospital Stay: Payer: 59

## 2022-07-29 ENCOUNTER — Encounter: Payer: Self-pay | Admitting: Hematology and Oncology

## 2022-07-29 ENCOUNTER — Inpatient Hospital Stay (HOSPITAL_BASED_OUTPATIENT_CLINIC_OR_DEPARTMENT_OTHER): Payer: 59 | Admitting: Hematology and Oncology

## 2022-07-29 ENCOUNTER — Other Ambulatory Visit (HOSPITAL_COMMUNITY): Payer: Self-pay

## 2022-07-29 ENCOUNTER — Inpatient Hospital Stay: Payer: 59

## 2022-07-29 ENCOUNTER — Other Ambulatory Visit: Payer: Self-pay | Admitting: Hematology and Oncology

## 2022-07-29 ENCOUNTER — Telehealth: Payer: Self-pay | Admitting: Hematology and Oncology

## 2022-07-29 VITALS — BP 138/71 | HR 67 | Temp 97.5°F | Resp 18 | Ht 66.0 in | Wt 190.6 lb

## 2022-07-29 DIAGNOSIS — R0781 Pleurodynia: Secondary | ICD-10-CM | POA: Diagnosis not present

## 2022-07-29 DIAGNOSIS — C786 Secondary malignant neoplasm of retroperitoneum and peritoneum: Secondary | ICD-10-CM | POA: Diagnosis not present

## 2022-07-29 DIAGNOSIS — R232 Flushing: Secondary | ICD-10-CM

## 2022-07-29 DIAGNOSIS — C569 Malignant neoplasm of unspecified ovary: Secondary | ICD-10-CM | POA: Diagnosis not present

## 2022-07-29 DIAGNOSIS — Z9221 Personal history of antineoplastic chemotherapy: Secondary | ICD-10-CM | POA: Diagnosis not present

## 2022-07-29 DIAGNOSIS — Z79811 Long term (current) use of aromatase inhibitors: Secondary | ICD-10-CM | POA: Diagnosis not present

## 2022-07-29 DIAGNOSIS — Z90722 Acquired absence of ovaries, bilateral: Secondary | ICD-10-CM | POA: Diagnosis not present

## 2022-07-29 DIAGNOSIS — Z90710 Acquired absence of both cervix and uterus: Secondary | ICD-10-CM | POA: Diagnosis not present

## 2022-07-29 DIAGNOSIS — K660 Peritoneal adhesions (postprocedural) (postinfection): Secondary | ICD-10-CM | POA: Diagnosis not present

## 2022-07-29 DIAGNOSIS — Z79818 Long term (current) use of other agents affecting estrogen receptors and estrogen levels: Secondary | ICD-10-CM | POA: Diagnosis not present

## 2022-07-29 MED ORDER — LEUPROLIDE ACETATE 3.75 MG IM KIT
3.7500 mg | PACK | Freq: Once | INTRAMUSCULAR | Status: AC
  Administered 2022-07-29: 3.75 mg via INTRAMUSCULAR
  Filled 2022-07-29: qty 3.75

## 2022-07-29 NOTE — Addendum Note (Signed)
Addended by: Morrell Riddle on: 07/29/2022 01:18 PM   Modules accepted: Orders

## 2022-07-29 NOTE — Assessment & Plan Note (Signed)
She denies worsening hot flashes Monitor closely

## 2022-07-29 NOTE — Assessment & Plan Note (Signed)
Her last CT imaging shows stability Overall, she tolerated treatment fairly well except for some intermittent mild hot flashes The plan will be to continue treatment indefinitely I plan to space out the interval of imaging study to every 6 months, her next imaging study will be due in August  Due to chronic antiestrogen therapy, I recommend the patient to get bone density scan repeated in May 2024

## 2022-07-29 NOTE — Telephone Encounter (Signed)
Spoke with patient confirming upcoming appointments  

## 2022-07-29 NOTE — Progress Notes (Signed)
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   All questions were answered. The patient knows to call the clinic with any problems, questions or concerns. The total time spent in the appointment was 20 minutes encounter with patients including review of chart and various tests results, discussions about plan of care and coordination of care plan   Artis Delay, MD 07/29/2022 12:58 PM  INTERVAL HISTORY: Please see below for problem oriented charting. she returns for treatment follow-up on antiestrogen therapy and Lupron for granulosa cell tumor of the ovary She has occasional intermittent rib pain Denies central abdominal pain or changes in bowel habits Hot flashes are manageable  REVIEW OF SYSTEMS:   Constitutional: Denies fevers, chills or abnormal weight loss Eyes: Denies blurriness of vision Ears, nose, mouth, throat, and face: Denies mucositis or sore throat Respiratory: Denies cough, dyspnea or wheezes Cardiovascular: Denies palpitation, chest discomfort or lower extremity swelling Gastrointestinal:  Denies nausea, heartburn or change in bowel habits Skin: Denies abnormal skin rashes Lymphatics: Denies new lymphadenopathy or easy bruising Neurological:Denies  numbness, tingling or new weaknesses Behavioral/Psych: Mood is stable, no new changes  All other systems were reviewed with the patient and are negative.  I have reviewed the past medical history, past surgical history, social history and family history with the patient and they are unchanged from previous note.  ALLERGIES:  is allergic to codeine, hydrocodone-acetaminophen, thimerosal, thimerosal (thiomersal), ciprofloxacin, daypro [oxaprozin], levofloxacin, stadol [butorphanol tartrate], and sulfa antibiotics.  MEDICATIONS:  Current Outpatient Medications  Medication Sig Dispense Refill   acetaminophen (TYLENOL) 500 MG tablet Take 1,000 mg by mouth every 6 (six) hours as needed for moderate pain.     anastrozole (ARIMIDEX) 1 MG tablet Take 1 tablet (1 mg total) by mouth daily. 90 tablet 3   aspirin EC 81 MG tablet Take 81 mg by mouth daily. Swallow whole.     atorvastatin (LIPITOR) 40 MG tablet Take 1 tablet (40 mg total) by mouth daily. 90 tablet 3   Biotin 5 MG TABS Take 5 mg by mouth daily.     buPROPion (WELLBUTRIN SR) 200 MG 12 hr tablet Take 1 tablet (200 mg total) by mouth 2 (two) times daily. 180 tablet 1   Ca Phosphate-Cholecalciferol (EQL CALCIUM GUMMIES) 250-400 MG-UNIT CHEW Chew 1 tablet by mouth 2 (two) times daily.     escitalopram (LEXAPRO) 20 MG tablet Take 1 tablet (20 mg total) by mouth daily. 90 tablet 2   gabapentin (NEURONTIN) 300 MG capsule Take 1 capsule (300 mg total) by mouth 2 (two) times daily. 60 capsule 3   leuprolide (LUPRON) 30 MG injection Inject 30 mg into the muscle every 30 (thirty) days.     metFORMIN (GLUCOPHAGE) 500 MG tablet Take 1 tablet (500 mg total) by mouth  every morning AND 2 tablets (1,000 mg total) every evening. Needs office visit. 270 tablet 1   mirabegron ER (MYRBETRIQ) 50 MG TB24 tablet Take 1 tablet (50 mg total) by mouth daily. 30 tablet 11   nitroGLYCERIN (NITROSTAT) 0.4 MG SL tablet Place 1 tablet (0.4 mg total) under the tongue every 5  (five) minutes as needed for chest pain. 25 tablet 3   omeprazole (PRILOSEC) 20 MG capsule Take 1 capsule (20 mg total) by mouth daily. 90 capsule 2   Polyethyl Glycol-Propyl Glycol (SYSTANE OP) Place 1 drop into both eyes daily as needed (dry eyes).     Prenatal MV & Min w/FA-DHA (PRENATAL GUMMIES) 0.18-25 MG CHEW Chew 1 capsule by mouth daily.     Probiotic Product (ALIGN PO) Take 1 tablet by mouth daily.      psyllium (METAMUCIL) 58.6 % powder Take 1 packet by mouth 2 (two) times daily.     Vitamin D, Ergocalciferol, (DRISDOL) 1.25 MG (50000 UNIT) CAPS capsule Take 1 capsule (50,000 Units total) by mouth every 10 days. 9 capsule 1   No current facility-administered medications for this visit.    SUMMARY OF ONCOLOGIC HISTORY: Oncology History Overview Note  2018 - Core biopsy for ER/PR and Foundation One testing performed. Unfortunately, no sufficient tissue for Foundation One testing. ER was 50% and PR 90%. Progressed on carboplatin, exemestane, Avastin,Tamoxifen/Megace and letrozole, mixed response on Lupron  AMH: 06/23/19: 108 05/26/19: 9.04 01/05/19: 6.84 09/02/18: 3.72 06/01/18: 3.84 02/24/18: 2.6 11/21/17: 3.26 08/26/17: 1.77 05/27/17: 2.12 01/28/17: 2.47 06/10/16: 2.68 02/06/16: 1.84 05/12/15: 3.27 10/03/14: 2.12  Inhibin B 09/22/2019: 95.9 05/26/19: 90.2 01/05/19: 92 09/02/18: 70.9 06/04/18: 70 02/24/18: 79.5 11/21/17: 85.8 08/26/17: 65.6 05/27/17: 58.9 01/28/17: 198 06/10/16: 118.1 02/06/16: 62.4 05/12/15: 64.6 10/03/14: 58   Malignant granulosa cell tumor of ovary  1994 Initial Diagnosis   1994   2002 Relapse/Recurrence   Upper abdominal recurrence, resected.     - 09/2000 Chemotherapy   6 cycles of IP cisplatin and etoposide    02/2009 Relapse/Recurrence   CT showed increased size of nodules in pelvis   2010 Surgery   Exlap with section of tumor nodules near cecum and left pelvic sidewall. Tumor: ER negative, PR positive    Treatment Plan Change   Alternated 2 week courses  of Megace and Tamoxifen - ended 03/2012   03/2012 PET scan   CT - progressive disease   2013 Treatment Plan Change   Letrozole   01/2016 Imaging   MRI showed progressive disease   2017 Treatment Plan Change   Two weeks of alternating Tamoxifen 20mg  daily and then Megace 40mg  TID   08/2016 Imaging   MRI showed progressive disease with peritoneal implants near liver, in pelvis   01/2017 Treatment Plan Change   Lupron 11.25 q 3 months   11/2017 Imaging   Overall mixed response.   Mixed cystic/solid lesions in the left pelvis are mildly improved.   Cystic peritoneal disease, including the dominant lesion along the posterior right hepatic lobe, is mildly progressed.   Subcapsular lesion along the posterior right hepatic lobe is unchanged.   03/2018 Imaging   MRI A/P: Mixed response of individual peritoneal metastases in the pelvis, as described above. Overall, there has been no significant change in bulk of disease.   Stable cystic peritoneal metastatic disease along the capsular surfaces of the liver and spleen.   No new sites of metastatic disease identified within the abdomen or pelvis.   08/2018 Imaging  MRI A/P: Status post hysterectomy and bilateral salpingo-oophorectomy.   Mixed cystic/solid peritoneal implants in the abdomen/pelvis, as above. Dominant cystic implant along the posterior liver surface is mildly increased. Remaining lesions are overall grossly unchanged.   No new lesions are identified.   01/28/2019 Imaging   Mri A/P: 1. Relatively similar appearance of peritoneal metastasis. A posterior right hepatic capsular based lesion is similar to minimally decreased in size. Left pelvic implants are primarily similar with possible enlargement of an anterior high left pelvic cystic implant. No new disease identified. 2.  Aortic Atherosclerosis (ICD10-I70.0). 3. Hepatic steatosis. 4. Left adrenal adenoma.   06/02/2019 Imaging   MRI 1. Potential slight  enlargement of dominant cystic area and solid component, associated with rind like signal variation on T2 along the inferior right hepatic margin, also potentially slightly increased. Findings may still be within the realm of measurement and technical variation. Close attention on follow-up. 2. Subtle cystic changes along the cephalad margin of the spleen are difficult to see on previous imaging, perhaps new compared with prior imaging studies. 3. Pelvic implants and left lower quadrant lesion with similar size, of the area along the left iliac vasculature may be slightly larger than on the prior study. 4. Signs of extensive retroperitoneal and pelvic lymphadenectomy. 5. Hepatic steatosis. 6. 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     06/08/2019 Cancer Staging   Staging form: Ovary, AJCC 7th Edition - Clinical: Stage IIIC (rT2, N1, M0) - Signed by Artis Delay, MD on 06/08/2019   06/10/2019 Imaging   1. 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 These findings are slightly worsened in comparison to a remote prior CT examination dated 10/04/2014.   2.  No evidence of metastatic disease in the chest.   3. Status post hysterectomy, pelvic and retroperitoneal lymph node dissection, and ventral hernia mesh repair.   4.  Hepatic steatosis.   5.  Aortic Atherosclerosis (ICD10-I70.0).   06/24/2019 - 09/02/2019 Chemotherapy   The patient had carboplatin for chemotherapy treatment.     09/02/2019 Tumor Marker   Patient's tumor was tested for the following markers: Inhibin B Results of the tumor marker test revealed 94   09/23/2019 Imaging   1. Interval progression of the soft tissue lesions in the anterior left pelvis, likely peritoneal implants, compatible with disease  progression. Remaining sites of apparent disease along the liver capsule and posterior spleen are stable 2. Stable 2 cm left adrenal adenoma. 3. Hepatic steatosis. 4. Right-side predominant colonic diverticulosis without diverticulitis. 5. Aortic Atherosclerosis (ICD10-I70.0).   12/23/2019 Imaging   1. Slight interval increase in size of a mixed solid and cystic nodule in the left hemipelvis measuring 3.0 x 2.9 cm, previously 2.7 x 2.1 cm. 2. Interval decrease in size of a nodule in the left paracolic gutter measuring 1.0 x 0.8 cm, previously 2.1 x 1.6 cm. 3. Stable peritoneal nodules in the hepatorenal recess and at the inferior tip of the spleen. 4. Unchanged nodule or lymph node overlying the left external iliac artery. 5. Enlargement of dominant left pelvic nodule is concerning for disease progression despite interval decrease in size of a nodule in the left paracolic gutter and stability of other nodules. 6. No evidence of metastatic disease in the chest. 7. Stable, benign left adrenal adenoma. 8.  Ventral hernia mesh repair with a small component of recurrent hernia inferiorly, containing a single nonobstructed loop of small bowel. 9. Hepatic steatosis. 10. Aortic Atherosclerosis (ICD10-I70.0).   12/23/2019 Tumor Marker   Patient's tumor was tested for the following markers: Inhibin B Results of the tumor marker test revealed 94.9   01/25/2020 Tumor Marker   Patient's tumor was tested for the following markers: Inhibin B Results of the tumor marker test revealed 116.7   02/20/2020 Genetic Testing   Negative genetic testing: no pathogenic variants detected in [facility] Multi-Cancer Panel.  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  The report date is February 20, 2020.   The Multi-Cancer Panel offered by [facility] includes sequencing and/or deletion duplication testing of the following 85 genes: AIP, ALK, APC, ATM, AXIN2,BAP1,  BARD1, BLM, BMPR1A, BRCA1, BRCA2,  BRIP1, CASR, CDC73, CDH1, CDK4, CDKN1B, CDKN1C, CDKN2A (p14ARF), CDKN2A (p16INK4a), CEBPA, CHEK2, CTNNA1, DICER1, DIS3L2, EGFR (c.2369C>T, p.Thr790Met variant only), EPCAM (Deletion/duplication testing only), FH, FLCN, GATA2, GPC3, GREM1 (Promoter region deletion/duplication testing only), HOXB13 (c.251G>A, p.Gly84Glu), HRAS, KIT, MAX, MEN1, MET, MITF (c.952G>A, p.Glu318Lys variant only), MLH1, MSH2, MSH3, MSH6, MUTYH, NBN, NF1, NF2, NTHL1, PALB2, PDGFRA, PHOX2B, PMS2, POLD1, POLE, POT1, PRKAR1A, PTCH1, PTEN, RAD50, RAD51C, RAD51D, RB1, RECQL4, RET, RNF43, RUNX1, SDHAF2, SDHA (sequence changes only), SDHB, SDHC, SDHD, SMAD4, SMARCA4, SMARCB1, SMARCE1, STK11, SUFU, TERC, TERT, TMEM127, TP53, TSC1, TSC2, VHL, WRN and WT1.    03/02/2020 Tumor Marker   Patient's tumor was tested for the following markers: Inhibin B Results of the tumor marker test revealed 80.8   04/17/2020 Imaging   1. Overall, exam is stable. Multiple peritoneal nodules are again seen. The index nodule in the left lower quadrant is mildly increased in size in the interval. The lesion within the anterior left pelvis has decreased in size in the interval. There has also been decrease in size of cystic lesion within the a hepatorenal recess. The remaining peritoneal lesions are unchanged. No new lesions identified. 2. Stable left adrenal nodule. 3.  Aortic Atherosclerosis (ICD10-I70.0).     08/21/2020 Imaging   Bone density is normal AP spine T score 0.8 Femoral neck on the left, T score -0.2 Femoral neck on the right ,T score -0.4   10/17/2020 Imaging   1. Interval progression of peritoneal nodules along the left pelvic sidewall, concerning for progressive metastatic disease. 2. Small capsular lesion medial right liver and the apparent cystic lesion superior to the right kidney are similar to prior. 3. Tiny soft tissue nodule anterior aspect of the lateral left pelvis described as decreasing on the prior study has decreased further on  today's exam. 4. Stable left adrenal nodule. This cannot be definitively characterized. 5. Tiny nonobstructing stone lower pole right kidney. 6. Aortic Atherosclerosis (ICD10-I70.0).   10/26/2020 Procedure   Successful placement of a right internal jugular approach power injectable Port-A-Cath. The catheter is ready for immediate use.       10/31/2020 - 10/04/2021 Chemotherapy   Patient is on Treatment Plan : OVARIAN Paclitaxel K5,5,37,48 q28d     01/25/2021 Imaging   1. Signs of peritoneal disease with scattered peritoneal nodules. The index lesions are either stable or decreased in size in the interval as detailed above. No new sites of disease. 2. Ventral pelvic wall hernia contains a nonobstructed loop of small bowel. 3. Aortic Atherosclerosis (ICD10-I70.0).     06/14/2021 Imaging   1. Multiple peritoneal metastatic nodules are slightly diminished in size. 2. A left external iliac lymph node  or nodule is however stable to slightly enlarged. 3. Overall findings are most consistent with continued treatment response of metastatic disease. 4. No evidence of metastatic disease within the chest. 5. Hepatic steatosis.     06/14/2021 Imaging   Right: Findings consistent with 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   Left: No evidence of thrombosis in the subclavian.   06/20/2021 Procedure   Successful right IJ vein Port-A-Cath explant.     10/18/2021 Imaging   1. Similar to mild interval increase in size of left external iliac lymph node. 2. Multiple peritoneal metastatic nodules are grossly unchanged in size when compared to prior exam   02/14/2022 Imaging   1. Enlarging lymph node along the LEFT iliac chain, compatible with mild worsening of disease in this area. Stable stigmata of disease in the upper abdomen. 2. Post LEFT retroperitoneal and pelvic lymphadenectomy. 3. Stable LEFT adrenal adenoma. 4. Stable 4 mm  pulmonary nodule in the LEFT lung base. 5. Aortic atherosclerosis. 6. Mild hepatic steatosis. 7. Post abdominal wall reconstruction with hernia below the mesh as on previous imaging.     05/24/2022 Imaging   1. Exam is generally stable with no substantial interval change and no clear trend towards improving or progressive disease. There is one tiny soft tissue nodule along the medial capsule of the posterior right liver measuring 8 x 5 mm, more conspicuous than on the prior study. Close attention on follow-up recommended. 2. A second capsular implant along the medial liver capsule more anteriorly measures smaller on today's exam. Additional capsular implants along the medial liver and inferior spleen are stable. The left iliac lymph node identified as enlarging on the previous study is not substantially changed in the interval. 3. Stable 2 cm left adrenal nodule, previously characterized as adenoma. 4. Stable 4 mm left lower lobe pulmonary nodule. 5. Similar appearance of ventral hernia repair with recurrence. 6.  Aortic Atherosclerosis (ICD10-I70.0).     PHYSICAL EXAMINATION: ECOG PERFORMANCE STATUS: 1 - Symptomatic but completely ambulatory  Vitals:   07/29/22 1229  BP: 138/71  Pulse: 67  Resp: 18  Temp: (!) 97.5 F (36.4 C)  SpO2: 100%   Filed Weights   07/29/22 1229  Weight: 190 lb 9.6 oz (86.5 kg)    GENERAL:alert, no distress and comfortable  NEURO: alert & oriented x 3 with fluent speech, no focal motor/sensory deficits  LABORATORY DATA:  I have reviewed the data as listed    Component Value Date/Time   NA 138 07/25/2022 1016   NA 140 07/09/2021 0813   NA 141 09/24/2013 1131   K 4.5 07/25/2022 1016   K 4.5 09/24/2013 1131   CL 103 07/25/2022 1016   CO2 29 07/25/2022 1016   CO2 24 09/24/2013 1131   GLUCOSE 91 07/25/2022 1016   GLUCOSE 90 09/24/2013 1131   BUN 22 07/25/2022 1016   BUN 20 07/09/2021 0813   BUN 21.2 09/24/2013 1131   CREATININE 0.99 07/25/2022  1016   CREATININE 1.19 (H) 06/20/2021 1513   CREATININE 1.1 09/24/2013 1131   CALCIUM 9.8 07/25/2022 1016   CALCIUM 9.5 09/24/2013 1131   PROT 6.9 07/25/2022 1016   PROT 6.7 11/02/2021 0000   PROT 6.8 09/24/2013 1131   ALBUMIN 4.1 07/25/2022 1016   ALBUMIN 4.4 11/02/2021 0000   ALBUMIN 3.9 09/24/2013 1131   AST 15 07/25/2022 1016   AST 21 06/20/2021 1513   AST 23 09/24/2013 1131   ALT  13 07/25/2022 1016   ALT 19 06/20/2021 1513   ALT 27 09/24/2013 1131   ALKPHOS 84 07/25/2022 1016   ALKPHOS 81 09/24/2013 1131   BILITOT 0.4 07/25/2022 1016   BILITOT 0.4 11/02/2021 0000   BILITOT 0.4 06/20/2021 1513   BILITOT 0.53 09/24/2013 1131   GFRNONAA >60 07/25/2022 1016   GFRNONAA 51 (L) 06/20/2021 1513   GFRAA 55 (L) 01/24/2020 1530   GFRAA 57 (L) 12/21/2019 1259    No results found for: "SPEP", "UPEP"  Lab Results  Component Value Date   WBC 6.4 07/25/2022   NEUTROABS 3.7 07/25/2022   HGB 13.4 07/25/2022   HCT 40.1 07/25/2022   MCV 88.5 07/25/2022   PLT 299 07/25/2022      Chemistry      Component Value Date/Time   NA 138 07/25/2022 1016   NA 140 07/09/2021 0813   NA 141 09/24/2013 1131   K 4.5 07/25/2022 1016   K 4.5 09/24/2013 1131   CL 103 07/25/2022 1016   CO2 29 07/25/2022 1016   CO2 24 09/24/2013 1131   BUN 22 07/25/2022 1016   BUN 20 07/09/2021 0813   BUN 21.2 09/24/2013 1131   CREATININE 0.99 07/25/2022 1016   CREATININE 1.19 (H) 06/20/2021 1513   CREATININE 1.1 09/24/2013 1131      Component Value Date/Time   CALCIUM 9.8 07/25/2022 1016   CALCIUM 9.5 09/24/2013 1131   ALKPHOS 84 07/25/2022 1016   ALKPHOS 81 09/24/2013 1131   AST 15 07/25/2022 1016   AST 21 06/20/2021 1513   AST 23 09/24/2013 1131   ALT 13 07/25/2022 1016   ALT 19 06/20/2021 1513   ALT 27 09/24/2013 1131   BILITOT 0.4 07/25/2022 1016   BILITOT 0.4 11/02/2021 0000   BILITOT 0.4 06/20/2021 1513   BILITOT 0.53 09/24/2013 1131

## 2022-07-30 ENCOUNTER — Encounter: Payer: Self-pay | Admitting: Gynecologic Oncology

## 2022-07-31 NOTE — Progress Notes (Unsigned)
GYNECOLOGIC ONCOLOGY NEW PATIENT CONSULTATION   Patient Name: [name]  Patient Age: 67 y.o. Date of Service: 08/01/22 Referring Provider: Kirby Funk, MD [address] [place],  [place] [address]   Primary Care Provider: Kirby Funk, MD Consulting Provider: Eugene Garnet, MD   Assessment/Plan:  ***   We requested a core biopsy for assessment of ER/PR and Foundation One testing.  Unfortunately, there was not enough tissue for Foundation One testing.  ER was 50% and PR 90%. Review of the literature seeking other treatments revealed possible alternative treatments including: Lupron Bevacizamab Letrozole 2.5 mg plus Metformin 500 mg Carbo/Taxol Everolimus plus exemestane   In October 2018 we began treatment with Lupron 11.25 mg IM every 3 months.  After 3 months she had a nice initial response based on MRI findings.  After 9 months she had a mixed response on MRI and rising tumor markers. In September 2020 her tumor markers became elevated and we obtained an MRI and CXR to assess disease status.  ***I discussed with her that next time we get surveillance imaging, we can make a special request to have the CT scan go a little bit more inferiorly to capture her entire buttock.  Inhibin B Is relatively asymptomatic tolerating treatment well. Gust complexity of surgery including hernia, prior hernia repair with mesh, known abdominal adhesions.  Given stability of her disease, this would be quite an extensive surgery and require possible splenectomy, resection around the inferior aspect of her liver, and pelvic lymph node. ***  A copy of this note was sent to the patient's referring provider.   60 minutes of total time was spent for this patient encounter, including preparation given extensive cancer history, face-to-face counseling with the patient and coordination of care, and documentation of the encounter.  Eugene Garnet, MD  Division of Gynecologic Oncology   Department of Obstetrics and Gynecology  [facility]  ___________________________________________  Chief Complaint: Chief Complaint  Patient presents with   Malignant granulosa cell tumor of ovary, unspecified latera    History of Present Illness:  [name] is a 67 y.o. y.o. female who is seen as a self referral for an evaluation of long history of granulosa cell tumor.  The patient has an extensive history of granulosa cell tumor, initially diagnosed in 1994.  She has undergone multiple debulking surgeries, the last of which was in 2002. In 2005, the patient underwent laparoscopic ventral incisional hernia repair with mesh.  In the postoperative, she developed increasing abdominal pain with CT findings suggesting small bowel obstruction.  She was taken to the OR for diagnostic laparoscopy converted to laparotomy for small bowel obstruction at 2 point secondary to adhesions.  Findings also notable for 3 small hernias cephalad to prior ventral hernia repair with mesh.  In 2018, she had drainage of a cystic lesion (benign) around her liver as well as a biopsy which showed recurrent adult granulosa cell tumor.  Tumor was moderately ER and strongly PR positive.  Unfortunately, there was not enough tissue for foundation 1 testing.  Previously been on multiple regimens discussed below.  Most recently, her imaging showed disease progression in June 2022, decision was made to proceed with single agent Taxol.  She completed 7 cycles of single agent Taxol in June 2023 and then was transitioned to Lupron + AI.  She continues on Lupron and Arimidex.  She is tolerating this quite well.  She has some mild hot flashes which she describes  as tolerable.  Recently, she had an episode of pulsating pain in the left upper quadrant and then radiated to her right upper quadrant.  She is unsure if she has had more pain in this location previously.  Since that episode, she has some  tenderness with palpation in the left upper quadrant below her ribs.  She endorses normal bowel function.  She was previously struggling with constipation, saw gastroenterology and now has increased her fiber with excellent results.  She denies any urinary symptoms.  She denies any symptoms related to her known incisional hernia.  She endorses a good appetite without nausea or emesis.  Treatment History: Oncology History Overview Note  2018 - Core biopsy for ER/PR and Foundation One testing performed. Unfortunately, no sufficient tissue for Foundation One testing. ER was 50% and PR 90%. Progressed on carboplatin, exemestane, Avastin,Tamoxifen/Megace and letrozole, mixed response on Lupron  [facility]: 06/23/19: 108 05/26/19: 9.04 01/05/19: 6.84 09/02/18: 3.72 06/01/18: 3.84 02/24/18: 2.6 11/21/17: 3.26 08/26/17: 1.77 05/27/17: 2.12 01/28/17: 2.47 06/10/16: 2.68 02/06/16: 1.84 05/12/15: 3.27 10/03/14: 2.12  Inhibin B 09/22/2019: 95.9 05/26/19: 90.2 01/05/19: 92 09/02/18: 70.9 06/04/18: 70 02/24/18: 79.5 11/21/17: 85.8 08/26/17: 65.6 05/27/17: 58.9 01/28/17: 198 06/10/16: 118.1 02/06/16: 62.4 05/12/15: 64.6 10/03/14: 58   Malignant granulosa cell tumor of ovary  1994 Initial Diagnosis   1994   2002 Relapse/Recurrence   Upper abdominal recurrence, resected.     - 09/2000 Chemotherapy   6 cycles of IP cisplatin and etoposide    02/2009 Relapse/Recurrence   CT showed increased size of nodules in pelvis   2010 Surgery   Exlap with section of tumor nodules near cecum and left pelvic sidewall. Tumor: ER negative, PR positive    Treatment Plan Change   Alternated 2 week courses of Megace and Tamoxifen - ended 03/2012   03/2012 PET scan   CT - progressive disease   2013 Treatment Plan Change   Letrozole   01/2016 Imaging   MRI showed progressive disease   2017 Treatment Plan Change   Two weeks of alternating Tamoxifen 20mg  daily and then Megace 40mg  TID   08/2016 Imaging   MRI showed progressive  disease with peritoneal implants near liver, in pelvis   01/2017 Treatment Plan Change   Lupron 11.25 q 3 months   11/2017 Imaging   Overall mixed response.   Mixed cystic/solid lesions in the left pelvis are mildly improved.   Cystic peritoneal disease, including the dominant lesion along the posterior right hepatic lobe, is mildly progressed.   Subcapsular lesion along the posterior right hepatic lobe is unchanged.   03/2018 Imaging   MRI A/P: Mixed response of individual peritoneal metastases in the pelvis, as described above. Overall, there has been no significant change in bulk of disease.   Stable cystic peritoneal metastatic disease along the capsular surfaces of the liver and spleen.   No new sites of metastatic disease identified within the abdomen or pelvis.   08/2018 Imaging   MRI A/P: Status post hysterectomy and bilateral salpingo-oophorectomy.   Mixed cystic/solid peritoneal implants in the abdomen/pelvis, as above. Dominant cystic implant along the posterior liver surface is mildly increased. Remaining lesions are overall grossly unchanged.   No new lesions are identified.   01/28/2019 Imaging   Mri A/P: 1. Relatively similar appearance of peritoneal metastasis. A posterior right hepatic capsular based lesion is similar to minimally decreased in size. Left pelvic implants are primarily similar with possible enlargement of an anterior high left pelvic cystic implant. No  new disease identified. 2.  Aortic Atherosclerosis (ICD10-I70.0). 3. Hepatic steatosis. 4. Left adrenal adenoma.   06/02/2019 Imaging   MRI 1. Potential slight enlargement of dominant cystic area and solid component, associated with rind like signal variation on T2 along the inferior right hepatic margin, also potentially slightly increased. Findings may still be within the realm of measurement and technical variation. Close attention on follow-up. 2. Subtle cystic changes along the cephalad margin of  the spleen are difficult to see on previous imaging, perhaps new compared with prior imaging studies. 3. Pelvic implants and left lower quadrant lesion with similar size, of the area along the left iliac vasculature may be slightly larger than on the prior study. 4. Signs of extensive retroperitoneal and pelvic lymphadenectomy. 5. Hepatic steatosis. 6. 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     06/08/2019 Cancer Staging   Staging form: Ovary, AJCC 7th Edition - Clinical: Stage IIIC (rT2, N1, M0) - Signed by Artis Delay, MD on 06/08/2019   06/10/2019 Imaging   1. 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 These findings are slightly worsened in comparison to a remote prior CT examination dated 10/04/2014.   2.  No evidence of metastatic disease in the chest.   3. Status post hysterectomy, pelvic and retroperitoneal lymph node dissection, and ventral hernia mesh repair.   4.  Hepatic steatosis.   5.  Aortic Atherosclerosis (ICD10-I70.0).   06/24/2019 - 09/02/2019 Chemotherapy   The patient had carboplatin for chemotherapy treatment.     09/02/2019 Tumor Marker   Patient's tumor was tested for the following markers: Inhibin B Results of the tumor marker test revealed 94   09/23/2019 Imaging   1. Interval progression of the soft tissue lesions in the anterior left pelvis, likely peritoneal implants, compatible with disease progression. Remaining sites of apparent disease along the liver capsule and posterior spleen are stable 2. Stable 2 cm left adrenal adenoma. 3. Hepatic steatosis. 4. Right-side predominant colonic diverticulosis without diverticulitis. 5. Aortic Atherosclerosis (ICD10-I70.0).   12/23/2019 Imaging   1. Slight interval increase in size of a mixed  solid and cystic nodule in the left hemipelvis measuring 3.0 x 2.9 cm, previously 2.7 x 2.1 cm. 2. Interval decrease in size of a nodule in the left paracolic gutter measuring 1.0 x 0.8 cm, previously 2.1 x 1.6 cm. 3. Stable peritoneal nodules in the hepatorenal recess and at the inferior tip of the spleen. 4. Unchanged nodule or lymph node overlying the left external iliac artery. 5. Enlargement of dominant left pelvic nodule is concerning for disease progression despite interval decrease in size of a nodule in the left paracolic gutter and stability of other nodules. 6. No evidence of metastatic disease in the chest. 7. Stable, benign left adrenal adenoma. 8. Ventral hernia mesh repair with a small component of recurrent hernia inferiorly, containing a single nonobstructed loop of small bowel. 9. Hepatic steatosis. 10. Aortic Atherosclerosis (ICD10-I70.0).   12/23/2019 Tumor Marker   Patient's tumor was tested for the following markers: Inhibin B Results of the tumor marker test revealed 94.9   01/25/2020 Tumor Marker   Patient's tumor was tested for the following markers: Inhibin B Results of the tumor marker test revealed 116.7   02/20/2020 Genetic Testing   Negative genetic testing: no pathogenic variants  detected in [facility] Multi-Cancer Panel.  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  The report date is February 20, 2020.   The Multi-Cancer Panel offered by [facility] includes sequencing and/or deletion duplication testing of the following 85 genes: AIP, ALK, APC, ATM, AXIN2,BAP1,  BARD1, BLM, BMPR1A, BRCA1, BRCA2, BRIP1, CASR, CDC73, CDH1, CDK4, CDKN1B, CDKN1C, CDKN2A (p14ARF), CDKN2A (p16INK4a), CEBPA, CHEK2, CTNNA1, DICER1, DIS3L2, EGFR (c.2369C>T, p.Thr790Met variant only), EPCAM (Deletion/duplication testing only), FH, FLCN, GATA2, GPC3, GREM1 (Promoter region deletion/duplication testing only), HOXB13 (c.251G>A, p.Gly84Glu), HRAS, KIT, MAX, MEN1, MET, MITF  (c.952G>A, p.Glu318Lys variant only), MLH1, MSH2, MSH3, MSH6, MUTYH, NBN, NF1, NF2, NTHL1, PALB2, PDGFRA, PHOX2B, PMS2, POLD1, POLE, POT1, PRKAR1A, PTCH1, PTEN, RAD50, RAD51C, RAD51D, RB1, RECQL4, RET, RNF43, RUNX1, SDHAF2, SDHA (sequence changes only), SDHB, SDHC, SDHD, SMAD4, SMARCA4, SMARCB1, SMARCE1, STK11, SUFU, TERC, TERT, TMEM127, TP53, TSC1, TSC2, VHL, WRN and WT1.    03/02/2020 Tumor Marker   Patient's tumor was tested for the following markers: Inhibin B Results of the tumor marker test revealed 80.8   04/17/2020 Imaging   1. Overall, exam is stable. Multiple peritoneal nodules are again seen. The index nodule in the left lower quadrant is mildly increased in size in the interval. The lesion within the anterior left pelvis has decreased in size in the interval. There has also been decrease in size of cystic lesion within the a hepatorenal recess. The remaining peritoneal lesions are unchanged. No new lesions identified. 2. Stable left adrenal nodule. 3.  Aortic Atherosclerosis (ICD10-I70.0).     08/21/2020 Imaging   Bone density is normal AP spine T score 0.8 Femoral neck on the left, T score -0.2 Femoral neck on the right ,T score -0.4   10/17/2020 Imaging   1. Interval progression of peritoneal nodules along the left pelvic sidewall, concerning for progressive metastatic disease. 2. Small capsular lesion medial right liver and the apparent cystic lesion superior to the right kidney are similar to prior. 3. Tiny soft tissue nodule anterior aspect of the lateral left pelvis described as decreasing on the prior study has decreased further on today's exam. 4. Stable left adrenal nodule. This cannot be definitively characterized. 5. Tiny nonobstructing stone lower pole right kidney. 6. Aortic Atherosclerosis (ICD10-I70.0).   10/26/2020 Procedure   Successful placement of a right internal jugular approach power injectable Port-A-Cath. The catheter is ready for immediate use.        10/31/2020 - 10/04/2021 Chemotherapy   Patient is on Treatment Plan : OVARIAN Paclitaxel Z6,1,09,60 q28d     01/25/2021 Imaging   1. Signs of peritoneal disease with scattered peritoneal nodules. The index lesions are either stable or decreased in size in the interval as detailed above. No new sites of disease. 2. Ventral pelvic wall hernia contains a nonobstructed loop of small bowel. 3. Aortic Atherosclerosis (ICD10-I70.0).     06/14/2021 Imaging   1. Multiple peritoneal metastatic nodules are slightly diminished in size. 2. A left external iliac lymph node or nodule is however stable to slightly enlarged. 3. Overall findings are most consistent with continued treatment response of metastatic disease. 4. No evidence of metastatic disease within the chest. 5. Hepatic steatosis.     06/14/2021 Imaging   Right: Findings consistent with 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   Left: No evidence of thrombosis in the subclavian.   06/20/2021 Procedure   Successful right IJ vein Port-A-Cath explant.     10/18/2021 Imaging  1. Similar to mild interval increase in size of left external iliac lymph node. 2. Multiple peritoneal metastatic nodules are grossly unchanged in size when compared to prior exam   02/14/2022 Imaging   1. Enlarging lymph node along the LEFT iliac chain, compatible with mild worsening of disease in this area. Stable stigmata of disease in the upper abdomen. 2. Post LEFT retroperitoneal and pelvic lymphadenectomy. 3. Stable LEFT adrenal adenoma. 4. Stable 4 mm pulmonary nodule in the LEFT lung base. 5. Aortic atherosclerosis. 6. Mild hepatic steatosis. 7. Post abdominal wall reconstruction with hernia below the mesh as on previous imaging.     05/24/2022 Imaging   1. Exam is generally stable with no substantial interval change and no clear trend towards improving or progressive disease. There is one  tiny soft tissue nodule along the medial capsule of the posterior right liver measuring 8 x 5 mm, more conspicuous than on the prior study. Close attention on follow-up recommended. 2. A second capsular implant along the medial liver capsule more anteriorly measures smaller on today's exam. Additional capsular implants along the medial liver and inferior spleen are stable. The left iliac lymph node identified as enlarging on the previous study is not substantially changed in the interval. 3. Stable 2 cm left adrenal nodule, previously characterized as adenoma. 4. Stable 4 mm left lower lobe pulmonary nodule. 5. Similar appearance of ventral hernia repair with recurrence. 6.  Aortic Atherosclerosis (ICD10-I70.0).     PAST MEDICAL HISTORY:  Past Medical History:  Diagnosis Date   Anxiety    Arthritis    Cervical syndrome    CKD (chronic kidney disease), stage III    Constipation    COVID-19 03/26/2019   Family history of colon cancer 02/15/2020   Fatty liver    GERD (gastroesophageal reflux disease)    Granulosa cell tumor of ovary    History of hiatal hernia    Hyperlipidemia    Joint pain    Neck pain    Obesity    Osteoarthritis    Ovarian cancer    PONV (postoperative nausea and vomiting)    history of n/v,  past surgeries no n/v   Sleep apnea    uses CPAP   Tremor      PAST SURGICAL HISTORY:  Past Surgical History:  Procedure Laterality Date   ABDOMINAL HYSTERECTOMY     TAH/BSO   APPENDECTOMY     EXPLORATORY LAPAROTOMY     for bowel obstruction   IR IMAGING GUIDED PORT INSERTION  10/25/2020   IR REMOVAL TUN ACCESS W/ PORT W/O FL MOD SED  06/19/2021   left toe surgery Left    Secondary tumor debulking  2002   SHOULDER SURGERY     2017 left   Tumor debulking  2010   VENTRAL HERNIA REPAIR      OB/GYN HISTORY:  OB History  Gravida Para Term Preterm AB Living  3         2  SAB IAB Ectopic Multiple Live Births               # Outcome Date GA Lbr Len/2nd Weight  Sex Delivery Anes PTL Lv  3 Gravida           2 Gravida           1 Gravida             No LMP recorded. Patient has had a hysterectomy.  Age at menarche: 34 Age at menopause:  At time of her hysterectomy Hx of HRT: No Hx of STDs: No Last pap: 2023 per her report History of abnormal pap smears: yes  SCREENING STUDIES:  Last mammogram: 2022  Last colonoscopy: 2023  MEDICATIONS: Outpatient Encounter Medications as of 08/01/2022  Medication Sig   acetaminophen (TYLENOL) 500 MG tablet Take 1,000 mg by mouth every 6 (six) hours as needed for moderate pain.   anastrozole (ARIMIDEX) 1 MG tablet Take 1 tablet (1 mg total) by mouth daily.   aspirin EC 81 MG tablet Take 81 mg by mouth daily. Swallow whole.   atorvastatin (LIPITOR) 40 MG tablet Take 1 tablet (40 mg total) by mouth daily.   Biotin 5 MG TABS Take 5 mg by mouth daily.   buPROPion (WELLBUTRIN SR) 200 MG 12 hr tablet Take 1 tablet (200 mg total) by mouth 2 (two) times daily.   Ca Phosphate-Cholecalciferol (EQL CALCIUM GUMMIES) 250-400 MG-UNIT CHEW Chew 1 tablet by mouth 2 (two) times daily.   escitalopram (LEXAPRO) 20 MG tablet Take 1 tablet (20 mg total) by mouth daily.   gabapentin (NEURONTIN) 300 MG capsule Take 1 capsule (300 mg total) by mouth 2 (two) times daily.   leuprolide (LUPRON) 30 MG injection Inject 30 mg into the muscle every 30 (thirty) days.   metFORMIN (GLUCOPHAGE) 500 MG tablet Take 1 tablet (500 mg total) by mouth every morning AND 2 tablets (1,000 mg total) every evening. Needs office visit.   mirabegron ER (MYRBETRIQ) 50 MG TB24 tablet Take 1 tablet (50 mg total) by mouth daily.   nitroGLYCERIN (NITROSTAT) 0.4 MG SL tablet Place 1 tablet (0.4 mg total) under the tongue every 5 (five) minutes as needed for chest pain.   omeprazole (PRILOSEC) 20 MG capsule Take 1 capsule (20 mg total) by mouth daily.   Polyethyl Glycol-Propyl Glycol (SYSTANE OP) Place 1 drop into both eyes daily as needed (dry eyes).    Probiotic Product (ALIGN PO) Take 1 tablet by mouth daily.    psyllium (METAMUCIL) 58.6 % powder Take 1 packet by mouth 2 (two) times daily.   Vitamin D, Ergocalciferol, (DRISDOL) 1.25 MG (50000 UNIT) CAPS capsule Take 1 capsule (50,000 Units total) by mouth every 10 days.   No facility-administered encounter medications on file as of 08/01/2022.    ALLERGIES:  Allergies  Allergen Reactions   Codeine Nausea And Vomiting   Hydrocodone-Acetaminophen Itching   Thimerosal Other (See Comments)    Eye redness   Thimerosal (Thiomersal) Other (See Comments)    Eye redness    Ciprofloxacin Other (See Comments)    FATIGUE   Daypro [Oxaprozin] Rash   Levofloxacin Other (See Comments)    fatigue   Stadol [Butorphanol Tartrate] Other (See Comments)    ALTERED MENTAL STATUS   Sulfa Antibiotics Rash     FAMILY HISTORY:  Family History  Problem Relation Age of Onset   Basal cell carcinoma Mother        3770s   Thyroid disease Mother    Stroke Father    Colon cancer Father [id number]   Hypertension Father    Heart disease Father    Colon cancer Paternal Uncle        dx 5860s   Colon cancer Cousin        maternal cousin; dx 6340s     SOCIAL HISTORY:  Social Connections: Not on file    REVIEW OF SYSTEMS:  + Abdominal pain Denies appetite changes, fevers, chills, fatigue, unexplained weight changes. Denies hearing loss, neck  lumps or masses, mouth sores, ringing in ears or voice changes. Denies cough or wheezing.  Denies shortness of breath. Denies chest pain or palpitations. Denies leg swelling. Denies abdominal distention, blood in stools, constipation, diarrhea, nausea, vomiting, or early satiety. Denies pain with intercourse, dysuria, frequency, hematuria or incontinence. Denies hot flashes, pelvic pain, vaginal bleeding or vaginal discharge.   Denies joint pain, back pain or muscle pain/cramps. Denies itching, rash, or wounds. Denies dizziness, headaches, numbness or seizures. Denies  swollen lymph nodes or glands, denies easy bruising or bleeding. Denies anxiety, depression, confusion, or decreased concentration.  Physical Exam:  Vital Signs for this encounter:  Blood pressure (!) 149/54, pulse 68, temperature 97.6 F (36.4 C), temperature source Oral, weight 189 lb 14.4 oz (86.1 kg), SpO2 99 %. Body mass index is 30.65 kg/m. General: Alert, oriented, no acute distress.  HEENT: Normocephalic, atraumatic. Sclera anicteric.  Chest: Clear to auscultation bilaterally. No wheezes, rhonchi, or rales. Cardiovascular: Regular rate and rhythm, no murmurs, rubs, or gallops.  Abdomen: Normoactive bowel sounds. Soft, nondistended. No masses or hepatosplenomegaly appreciated. No palpable fluid wave.  Significant well-healed incisions, no palpable hernia.  Patient has some point tenderness along the midportion of her left upper quadrant incision. Extremities: Grossly normal range of motion. Warm, well perfused. No edema bilaterally.  Skin: No rashes or lesions.  Lymphatics: No cervical, supraclavicular, or inguinal adenopathy.  GU:  Normal external female genitalia. No lesions. No discharge or bleeding.             Bladder/urethra:  No lesions or masses, well supported bladder             Vagina: moderately atrophic, no lesions.             Cervix/uterus: Surgically absent.  Cuff intact.             Adnexa: No masses.  Rectal: Rectal exam deferred but on palpation of the left buttocks, there is what I appreciate to be a 1-2 cm somewhat fluctuant lesion that is quite deep.  LABORATORY AND RADIOLOGIC DATA:  Outside medical records were reviewed to synthesize the above history, along with the history and physical obtained during the visit.   Lab Results  Component Value Date   WBC 6.4 07/25/2022   HGB 13.4 07/25/2022   HCT 40.1 07/25/2022   PLT 299 07/25/2022   GLUCOSE 91 07/25/2022   CHOL 255 (H) 07/09/2021   TRIG 126 07/09/2021   HDL 50 07/09/2021   LDLCALC 182 (H)  07/09/2021   ALT 13 07/25/2022   AST 15 07/25/2022   NA 138 07/25/2022   K 4.5 07/25/2022   CL 103 07/25/2022   CREATININE 0.99 07/25/2022   BUN 22 07/25/2022   CO2 29 07/25/2022   TSH 0.836 02/12/2022   INR 1.06 10/04/2016   HGBA1C 5.5 02/12/2022

## 2022-08-01 ENCOUNTER — Encounter: Payer: Self-pay | Admitting: Gynecologic Oncology

## 2022-08-01 ENCOUNTER — Inpatient Hospital Stay: Payer: 59

## 2022-08-01 ENCOUNTER — Inpatient Hospital Stay (HOSPITAL_BASED_OUTPATIENT_CLINIC_OR_DEPARTMENT_OTHER): Payer: 59 | Admitting: Gynecologic Oncology

## 2022-08-01 VITALS — BP 149/54 | HR 68 | Temp 97.6°F | Wt 189.9 lb

## 2022-08-01 DIAGNOSIS — Z79811 Long term (current) use of aromatase inhibitors: Secondary | ICD-10-CM | POA: Diagnosis not present

## 2022-08-01 DIAGNOSIS — C569 Malignant neoplasm of unspecified ovary: Secondary | ICD-10-CM

## 2022-08-01 DIAGNOSIS — R232 Flushing: Secondary | ICD-10-CM | POA: Diagnosis not present

## 2022-08-01 DIAGNOSIS — Z8719 Personal history of other diseases of the digestive system: Secondary | ICD-10-CM

## 2022-08-01 DIAGNOSIS — Z90722 Acquired absence of ovaries, bilateral: Secondary | ICD-10-CM | POA: Diagnosis not present

## 2022-08-01 DIAGNOSIS — K660 Peritoneal adhesions (postprocedural) (postinfection): Secondary | ICD-10-CM

## 2022-08-01 DIAGNOSIS — Z9889 Other specified postprocedural states: Secondary | ICD-10-CM | POA: Diagnosis not present

## 2022-08-01 DIAGNOSIS — Z79818 Long term (current) use of other agents affecting estrogen receptors and estrogen levels: Secondary | ICD-10-CM | POA: Diagnosis not present

## 2022-08-01 DIAGNOSIS — C786 Secondary malignant neoplasm of retroperitoneum and peritoneum: Secondary | ICD-10-CM | POA: Diagnosis not present

## 2022-08-01 DIAGNOSIS — Z90710 Acquired absence of both cervix and uterus: Secondary | ICD-10-CM | POA: Diagnosis not present

## 2022-08-01 DIAGNOSIS — R0781 Pleurodynia: Secondary | ICD-10-CM | POA: Diagnosis not present

## 2022-08-01 DIAGNOSIS — Z9221 Personal history of antineoplastic chemotherapy: Secondary | ICD-10-CM | POA: Diagnosis not present

## 2022-08-01 NOTE — Patient Instructions (Signed)
It was great to see you today.  Your inhibin B will be back likely early next week.  I will plan to see you in 6 months, but please reach out if anything changes before then.

## 2022-08-05 LAB — INHIBIN B: Inhibin B: 42.2 pg/mL — ABNORMAL HIGH (ref 0.0–16.9)

## 2022-08-07 ENCOUNTER — Encounter: Payer: Self-pay | Admitting: Hematology and Oncology

## 2022-08-12 ENCOUNTER — Telehealth: Payer: Self-pay

## 2022-08-12 NOTE — Telephone Encounter (Signed)
We discussed this before There is nothing in that area on last imaging Her medications should not cause diarrhea; she can take imodium prn From our last discussion, we decided to space out the imaging, I put in my notes we plan to recheck in August If she wants it done sooner, I can place an order

## 2022-08-12 NOTE — Telephone Encounter (Signed)
She called and has had intermittent right upper quadrant pain that started Friday. The pain may be getting a little better. She started having diarrhea on Saturday. Yesterday she had a urgency with diarrhea and had incontinence. Denies nausea/vomiting. Today she has had 3 episodes of diarrhea today. She has not taken any imodium.  She is asking what Dr. Bertis Ruddy recommends. She has not taken anything for pain. Should she take imodium? She said that the right upper pain feels like the same pain that she had when she had elevated liver enzymes. She was going to schedule the CT but nothing is ordered.

## 2022-08-12 NOTE — Telephone Encounter (Signed)
Called and given below message. She verbalized understanding. She will try imodium and call the office back tomorrow to give update if she wants earlier scan.

## 2022-08-15 ENCOUNTER — Encounter: Payer: Self-pay | Admitting: Hematology and Oncology

## 2022-08-19 ENCOUNTER — Other Ambulatory Visit: Payer: Self-pay | Admitting: Hematology and Oncology

## 2022-08-20 ENCOUNTER — Other Ambulatory Visit (HOSPITAL_COMMUNITY): Payer: Self-pay

## 2022-08-20 ENCOUNTER — Other Ambulatory Visit: Payer: Self-pay

## 2022-08-20 MED ORDER — GABAPENTIN 300 MG PO CAPS
300.0000 mg | ORAL_CAPSULE | Freq: Two times a day (BID) | ORAL | 3 refills | Status: DC
  Filled 2022-08-20: qty 60, 30d supply, fill #0
  Filled 2022-10-14: qty 60, 30d supply, fill #1
  Filled 2023-05-12: qty 60, 30d supply, fill #2

## 2022-08-22 ENCOUNTER — Encounter: Payer: Self-pay | Admitting: Hematology and Oncology

## 2022-08-22 ENCOUNTER — Encounter (INDEPENDENT_AMBULATORY_CARE_PROVIDER_SITE_OTHER): Payer: Self-pay | Admitting: Physician Assistant

## 2022-08-22 ENCOUNTER — Other Ambulatory Visit (HOSPITAL_COMMUNITY): Payer: Self-pay

## 2022-08-22 ENCOUNTER — Ambulatory Visit (INDEPENDENT_AMBULATORY_CARE_PROVIDER_SITE_OTHER): Payer: 59 | Admitting: Physician Assistant

## 2022-08-22 ENCOUNTER — Ambulatory Visit (INDEPENDENT_AMBULATORY_CARE_PROVIDER_SITE_OTHER): Payer: Commercial Managed Care - PPO | Admitting: Family Medicine

## 2022-08-22 VITALS — BP 117/72 | HR 48 | Temp 98.1°F | Ht 66.0 in | Wt 185.0 lb

## 2022-08-22 DIAGNOSIS — E559 Vitamin D deficiency, unspecified: Secondary | ICD-10-CM | POA: Diagnosis not present

## 2022-08-22 DIAGNOSIS — E669 Obesity, unspecified: Secondary | ICD-10-CM | POA: Diagnosis not present

## 2022-08-22 DIAGNOSIS — Z6829 Body mass index (BMI) 29.0-29.9, adult: Secondary | ICD-10-CM | POA: Diagnosis not present

## 2022-08-22 DIAGNOSIS — R7303 Prediabetes: Secondary | ICD-10-CM | POA: Diagnosis not present

## 2022-08-22 DIAGNOSIS — F3289 Other specified depressive episodes: Secondary | ICD-10-CM

## 2022-08-22 MED ORDER — VITAMIN D (ERGOCALCIFEROL) 1.25 MG (50000 UNIT) PO CAPS
50000.0000 [IU] | ORAL_CAPSULE | ORAL | 1 refills | Status: DC
Start: 2022-08-22 — End: 2022-10-03
  Filled 2022-08-22: qty 9, fill #0

## 2022-08-22 MED ORDER — BUPROPION HCL ER (SR) 200 MG PO TB12
200.0000 mg | ORAL_TABLET | Freq: Two times a day (BID) | ORAL | 1 refills | Status: DC
Start: 2022-08-22 — End: 2022-10-03
  Filled 2022-08-22: qty 180, 90d supply, fill #0

## 2022-08-22 MED ORDER — METFORMIN HCL 500 MG PO TABS
ORAL_TABLET | ORAL | 1 refills | Status: DC
Start: 2022-08-22 — End: 2022-10-03
  Filled 2022-08-22: qty 270, 90d supply, fill #0

## 2022-08-22 NOTE — Assessment & Plan Note (Addendum)
Prediabetes Last A1c was 5.5  Medication(s):  metformin  500 mg in am and 1000 mg in evening with meals. No GI side effects or other side effects with metformin.  She is working on Engineer, technical sales to decrease simple carbohydrates, increase lean proteins and exercise to promote weight loss, improve glycemic control and prevent progression to Type 2 diabetes.   Lab Results  Component Value Date   HGBA1C 5.5 02/12/2022   HGBA1C 5.4 07/09/2021   HGBA1C 5.2 11/30/2019   HGBA1C 5.3 05/31/2019   HGBA1C 5.2 11/19/2018   Lab Results  Component Value Date   INSULIN 17.4 02/12/2022   INSULIN 17.1 07/09/2021   INSULIN 23.8 11/30/2019   INSULIN 16.5 05/31/2019   INSULIN 17.6 11/19/2018    Plan: Continue and refill metformin 500 mg in am and 1000 mg in evening with meals. Continue working on nutrition plan to decrease simple carbohydrates, increase lean proteins and exercise to promote weight loss, improve glycemic control and prevent progression to Type 2 diabetes.  Plan to Recheck A1C next visit.

## 2022-08-22 NOTE — Assessment & Plan Note (Signed)
Other depression/emotional eating [name] has had issues with stress/emotional eating. Currently this is moderately controlled. Overall mood is stable. Medication(s): Bupropion SR 200 mg twice daily No side effects with Wellbutrin.   Plan: Continue and refill Bupropion SR 200 mg twice daily Continue to work on emotional eating strategies.

## 2022-08-22 NOTE — Progress Notes (Signed)
Office: [phone]  /  Fax: [phone]  WEIGHT SUMMARY AND BIOMETRICS  Vitals Temp: 98.1 F (36.7 C) BP: 117/72 Pulse Rate: (!) 48 SpO2: 98 %   Anthropometric Measurements Height: 5\' 6"  (1.676 m) Weight: 185 lb (83.9 kg) BMI (Calculated): 29.87 Weight at Last Visit: 184 lb Weight Gained Since Last Visit: 1 lb Starting Weight: 209 lb Total Weight Loss (lbs): 24 lb (10.9 kg)   Body Composition  Body Fat %: 40.5 % Fat Mass (lbs): 75 lbs Muscle Mass (lbs): 104.6 lbs Total Body Water (lbs): 72 lbs Visceral Fat Rating : 11   Other Clinical Data Fasting: no Labs: no Today's Visit #: 18 Starting Date: 04/29/18     HPI  Chief Complaint: OBESITY  Nijah is here to discuss her progress with her obesity treatment plan. She is on the practicing portion control and making smarter food choices, such as increasing vegetables and decreasing simple carbohydrates and states she is following her eating plan approximately 90 % of the time. She states she is exercising/walking 30/60 minutes 2-3 times per week.   Interval History:  Since last office visit she is up 1 lb/maintained well.  Hunger/appetite- controlled. Does not always meet protein goals. Calories ~ 1300-1500 max.  Craving for ice cream lately- Discussed better options- [place], cottage cheese "ice cream recipe"  Exercise- Y classes- Total body work out 1-2 days weekly and walking on weekends.  Stress- manageable- works full time at [employer] center. Dealing with ongoing chemotherapy for ovarian cancer which is stable . Anastrozole and Lupron Sleep- feels restorative most nights~ 7 hrs. Takes gabapentin for peripheral neuropathy/helps with sleep.   Discussed [email or web address] for recipe ideas to help stay on track . Calories 1100-1200/ Protein 85-90 grams daily.   Pharmacotherapy: metformin for prediabetes- No GI side effects with metformin and wellbutrin for emotional eating behavior- No side effects with  wellbutrin.   PHYSICAL EXAM:  Blood pressure 117/72, pulse (!) 48, temperature 98.1 F (36.7 C), height 5\' 6"  (1.676 m), weight 185 lb (83.9 kg), SpO2 98 %. Body mass index is 29.86 kg/m.  General: She is overweight, cooperative, alert, well developed, and in no acute distress. PSYCH: Has normal mood, affect and thought process.   Cardiovascular: mildly brady, regular.  Lungs: Normal breathing effort, no conversational dyspnea.  DIAGNOSTIC DATA REVIEWED:  BMET    Component Value Date/Time   NA 138 07/25/2022 1016   NA 140 07/09/2021 0813   NA 141 09/24/2013 1131   K 4.5 07/25/2022 1016   K 4.5 09/24/2013 1131   CL 103 07/25/2022 1016   CO2 29 07/25/2022 1016   CO2 24 09/24/2013 1131   GLUCOSE 91 07/25/2022 1016   GLUCOSE 90 09/24/2013 1131   BUN 22 07/25/2022 1016   BUN 20 07/09/2021 0813   BUN 21.2 09/24/2013 1131   CREATININE 0.99 07/25/2022 1016   CREATININE 1.19 (H) 06/20/2021 1513   CREATININE 1.1 09/24/2013 1131   CALCIUM 9.8 07/25/2022 1016   CALCIUM 9.5 09/24/2013 1131   GFRNONAA >60 07/25/2022 1016   GFRNONAA 51 (L) 06/20/2021 1513   GFRAA 55 (L) 01/24/2020 1530   GFRAA 57 (L) 12/21/2019 1259   Lab Results  Component Value Date   HGBA1C 5.5 02/12/2022   HGBA1C 5.5 04/29/2018   Lab Results  Component Value Date   INSULIN 17.4 02/12/2022   INSULIN 25.8 (H) 04/29/2018   Lab Results  Component Value Date   TSH 0.836 02/12/2022   CBC    Component  Value Date/Time   WBC 6.4 07/25/2022 1016   RBC 4.53 07/25/2022 1016   HGB 13.4 07/25/2022 1016   HGB 12.9 08/09/2021 1107   HGB 13.4 07/09/2021 0813   HCT 40.1 07/25/2022 1016   HCT 38.9 07/09/2021 0813   PLT 299 07/25/2022 1016   PLT 315 08/09/2021 1107   PLT 349 07/09/2021 0813   MCV 88.5 07/25/2022 1016   MCV 91 07/09/2021 0813   MCH 29.6 07/25/2022 1016   MCHC 33.4 07/25/2022 1016   RDW 12.4 07/25/2022 1016   RDW 13.5 07/09/2021 0813   Iron Studies No results found for: "IRON", "TIBC",  "FERRITIN", "IRONPCTSAT" Lipid Panel     Component Value Date/Time   CHOL 255 (H) 07/09/2021 0813   TRIG 126 07/09/2021 0813   HDL 50 07/09/2021 0813   LDLCALC 182 (H) 07/09/2021 0813   Hepatic Function Panel     Component Value Date/Time   PROT 6.9 07/25/2022 1016   PROT 6.7 11/02/2021 0000   PROT 6.8 09/24/2013 1131   ALBUMIN 4.1 07/25/2022 1016   ALBUMIN 4.4 11/02/2021 0000   ALBUMIN 3.9 09/24/2013 1131   AST 15 07/25/2022 1016   AST 21 06/20/2021 1513   AST 23 09/24/2013 1131   ALT 13 07/25/2022 1016   ALT 19 06/20/2021 1513   ALT 27 09/24/2013 1131   ALKPHOS 84 07/25/2022 1016   ALKPHOS 81 09/24/2013 1131   BILITOT 0.4 07/25/2022 1016   BILITOT 0.4 11/02/2021 0000   BILITOT 0.4 06/20/2021 1513   BILITOT 0.53 09/24/2013 1131   BILIDIR 0.12 11/02/2021 0000      Component Value Date/Time   TSH 0.836 02/12/2022 0844   Nutritional Lab Results  Component Value Date   VD25OH 81.7 02/12/2022   VD25OH 38.6 07/09/2021   VD25OH 59.0 11/30/2019    ASSOCIATED CONDITIONS ADDRESSED TODAY  ASSESSMENT AND PLAN  Problem List Items Addressed This Visit     Vitamin D deficiency    Vitamin D Deficiency Vitamin D is at goal of 50.  Most recent vitamin D level was 81.7. She is on Ergocalciferol 50,000 IU every 10 days. No side effects with Ergocalciferol, no N/V , no muscle weakness. Lab Results  Component Value Date   VD25OH 81.7 02/12/2022   VD25OH 38.6 07/09/2021   VD25OH 59.0 11/30/2019   Plan: Continue and refill Ergocalciferol 50,000 IU every 10 days.  Will plan to recheck vitamin D level next visit.  Low vitamin D levels can be associated with adiposity and may result in leptin resistance and weight gain. Also associated with fatigue. Currently on vitamin D supplementation without any adverse effects.         Relevant Medications   Vitamin D, Ergocalciferol, (DRISDOL) 1.25 MG (50000 UNIT) CAPS capsule   Prediabetes - Primary    Prediabetes Last A1c was 5.5   Medication(s):  metformin  500 mg in am and 1000 mg in evening with meals. No GI side effects or other side effects with metformin.  She is working on Engineer, technical sales to decrease simple carbohydrates, increase lean proteins and exercise to promote weight loss, improve glycemic control and prevent progression to Type 2 diabetes.   Lab Results  Component Value Date   HGBA1C 5.5 02/12/2022   HGBA1C 5.4 07/09/2021   HGBA1C 5.2 11/30/2019   HGBA1C 5.3 05/31/2019   HGBA1C 5.2 11/19/2018   Lab Results  Component Value Date   INSULIN 17.4 02/12/2022   INSULIN 17.1 07/09/2021   INSULIN 23.8 11/30/2019  INSULIN 16.5 05/31/2019   INSULIN 17.6 11/19/2018   Plan: Continue and refill metformin 500 mg in am and 1000 mg in evening with meals. Continue working on nutrition plan to decrease simple carbohydrates, increase lean proteins and exercise to promote weight loss, improve glycemic control and prevent progression to Type 2 diabetes.  Plan to Recheck A1C next visit.         Relevant Medications   metFORMIN (GLUCOPHAGE) 500 MG tablet   Depression    Other depression/emotional eating Makensey has had issues with stress/emotional eating. Currently this is moderately controlled. Overall mood is stable. Medication(s): Bupropion SR 200 mg twice daily No side effects with Wellbutrin.   Plan: Continue and refill Bupropion SR 200 mg twice daily Continue to work on emotional eating strategies.           Relevant Medications   buPROPion (WELLBUTRIN SR) 200 MG 12 hr tablet   Generalized obesity- Start BMI 33.73   Relevant Medications   metFORMIN (GLUCOPHAGE) 500 MG tablet   BMI 29.0-29.9,adult Current BMI 29.9   Current BMI 29.9 TBW loss of 11.5%   TREATMENT PLAN FOR OBESITY:  Recommended Dietary Goals  Genita is currently in the action stage of change. As such, her goal is to continue weight management plan. She has agreed to practicing portion control and making smarter food choices,  such as increasing vegetables and decreasing simple carbohydrates.  Behavioral Intervention  We discussed the following Behavioral Modification Strategies today: increasing lean protein intake, decreasing simple carbohydrates , increasing vegetables, avoiding skipping meals, increasing water intake, work on meal planning and preparation, continue to practice mindfulness when eating, and planning for success.  Additional resources provided today: NA  Recommended Physical Activity Goals  Hanalei has been advised to work up to 150 minutes of moderate intensity aerobic activity a week and strengthening exercises 2-3 times per week for cardiovascular health, weight loss maintenance and preservation of muscle mass.   She has agreed to Continue current level of physical activity    Pharmacotherapy We discussed various medication options to help Tashayla with her weight loss efforts and we both agreed to continue metformin for prediabetes , Wellbutrin for emotional eating behavior and continue to work on nutritional and behavioral strategies to promote weight loss.    Return in about 6 weeks (around 10/03/2022).Marland Kitchen She was informed of the importance of frequent follow up visits to maximize her success with intensive lifestyle modifications for her multiple health conditions.   ATTESTASTION STATEMENTS:  Reviewed by clinician on day of visit: allergies, medications, problem list, medical history, surgical history, family history, social history, and previous encounter notes.   I have personally spent 40 minutes total time today in preparation, patient care, nutritional counseling and documentation for this visit, including the following: review of clinical lab tests; review of medical tests/procedures/services.      Alyshia Kernan, PA-C

## 2022-08-22 NOTE — Assessment & Plan Note (Signed)
Vitamin D Deficiency Vitamin D is at goal of 50.  Most recent vitamin D level was 81.7. She is on Ergocalciferol 50,000 IU every 10 days. No side effects with Ergocalciferol, no N/V , no muscle weakness. Lab Results  Component Value Date   VD25OH 81.7 02/12/2022   VD25OH 38.6 07/09/2021   VD25OH 59.0 11/30/2019    Plan: Continue and refill Ergocalciferol 50,000 IU every 10 days.  Will plan to recheck vitamin D level next visit.  Low vitamin D levels can be associated with adiposity and may result in leptin resistance and weight gain. Also associated with fatigue. Currently on vitamin D supplementation without any adverse effects.

## 2022-08-26 ENCOUNTER — Inpatient Hospital Stay: Payer: 59 | Attending: Hematology

## 2022-08-26 DIAGNOSIS — C569 Malignant neoplasm of unspecified ovary: Secondary | ICD-10-CM | POA: Insufficient documentation

## 2022-08-26 DIAGNOSIS — N183 Chronic kidney disease, stage 3 unspecified: Secondary | ICD-10-CM | POA: Insufficient documentation

## 2022-08-26 DIAGNOSIS — C786 Secondary malignant neoplasm of retroperitoneum and peritoneum: Secondary | ICD-10-CM | POA: Diagnosis not present

## 2022-08-26 DIAGNOSIS — C787 Secondary malignant neoplasm of liver and intrahepatic bile duct: Secondary | ICD-10-CM | POA: Insufficient documentation

## 2022-08-26 LAB — CBC WITH DIFFERENTIAL/PLATELET
Abs Immature Granulocytes: 0.01 10*3/uL (ref 0.00–0.07)
Basophils Absolute: 0 10*3/uL (ref 0.0–0.1)
Basophils Relative: 1 %
Eosinophils Absolute: 0.1 10*3/uL (ref 0.0–0.5)
Eosinophils Relative: 2 %
HCT: 38.6 % (ref 36.0–46.0)
Hemoglobin: 12.9 g/dL (ref 12.0–15.0)
Immature Granulocytes: 0 %
Lymphocytes Relative: 29 %
Lymphs Abs: 1.7 10*3/uL (ref 0.7–4.0)
MCH: 29.5 pg (ref 26.0–34.0)
MCHC: 33.4 g/dL (ref 30.0–36.0)
MCV: 88.1 fL (ref 80.0–100.0)
Monocytes Absolute: 0.5 10*3/uL (ref 0.1–1.0)
Monocytes Relative: 8 %
Neutro Abs: 3.5 10*3/uL (ref 1.7–7.7)
Neutrophils Relative %: 60 %
Platelets: 294 10*3/uL (ref 150–400)
RBC: 4.38 MIL/uL (ref 3.87–5.11)
RDW: 12.5 % (ref 11.5–15.5)
WBC: 5.9 10*3/uL (ref 4.0–10.5)
nRBC: 0 % (ref 0.0–0.2)

## 2022-08-26 LAB — COMPREHENSIVE METABOLIC PANEL
ALT: 15 U/L (ref 0–44)
AST: 18 U/L (ref 15–41)
Albumin: 4.1 g/dL (ref 3.5–5.0)
Alkaline Phosphatase: 86 U/L (ref 38–126)
Anion gap: 9 (ref 5–15)
BUN: 22 mg/dL (ref 8–23)
CO2: 26 mmol/L (ref 22–32)
Calcium: 9.3 mg/dL (ref 8.9–10.3)
Chloride: 104 mmol/L (ref 98–111)
Creatinine, Ser: 1.08 mg/dL — ABNORMAL HIGH (ref 0.44–1.00)
GFR, Estimated: 57 mL/min — ABNORMAL LOW (ref >60)
Glucose, Bld: 154 mg/dL — ABNORMAL HIGH (ref 70–99)
Potassium: 3.7 mmol/L (ref 3.5–5.1)
Sodium: 139 mmol/L (ref 135–145)
Total Bilirubin: 0.5 mg/dL (ref 0.3–1.2)
Total Protein: 6.6 g/dL (ref 6.5–8.1)

## 2022-08-27 ENCOUNTER — Inpatient Hospital Stay (HOSPITAL_BASED_OUTPATIENT_CLINIC_OR_DEPARTMENT_OTHER): Payer: 59 | Admitting: Hematology and Oncology

## 2022-08-27 ENCOUNTER — Inpatient Hospital Stay: Payer: 59

## 2022-08-27 ENCOUNTER — Encounter: Payer: Self-pay | Admitting: Hematology and Oncology

## 2022-08-27 VITALS — BP 134/64 | HR 64 | Resp 18 | Ht 66.0 in | Wt 190.0 lb

## 2022-08-27 DIAGNOSIS — C569 Malignant neoplasm of unspecified ovary: Secondary | ICD-10-CM | POA: Diagnosis not present

## 2022-08-27 DIAGNOSIS — N183 Chronic kidney disease, stage 3 unspecified: Secondary | ICD-10-CM | POA: Diagnosis not present

## 2022-08-27 DIAGNOSIS — C787 Secondary malignant neoplasm of liver and intrahepatic bile duct: Secondary | ICD-10-CM | POA: Diagnosis not present

## 2022-08-27 DIAGNOSIS — C786 Secondary malignant neoplasm of retroperitoneum and peritoneum: Secondary | ICD-10-CM | POA: Diagnosis not present

## 2022-08-27 MED ORDER — LEUPROLIDE ACETATE 3.75 MG IM KIT
3.7500 mg | PACK | Freq: Once | INTRAMUSCULAR | Status: AC
  Administered 2022-08-27: 3.75 mg via INTRAMUSCULAR
  Filled 2022-08-27: qty 3.75

## 2022-08-27 NOTE — Patient Instructions (Signed)
Leuprolide Solution for Injection What is this medication? LEUPROLIDE (loo PROE lide) reduces the symptoms of prostate cancer. It works by decreasing levels of the hormone testosterone in the body. This prevents prostate cancer cells from spreading or growing. This medicine may be used for other purposes; ask your health care provider or pharmacist if you have questions. COMMON BRAND NAME(S): Lupron What should I tell my care team before I take this medication? They need to know if you have any of these conditions: Diabetes Heart attack Heart disease High blood pressure High cholesterol Pain or difficulty passing urine Spinal cord metastasis Stroke Tobacco use An unusual or allergic reaction to leuprolide, other medications, foods, dyes, or preservatives Pregnant or trying to get pregnant Breast-feeding How should I use this medication? This medication is for injection under the skin or into a muscle. You will be taught how to prepare and give this medication. Use exactly as directed. Take your medication at regular intervals. Do not take it more often than directed. It is important that you put your used needles and syringes in a special sharps container. Do not put them in a trash can. If you do not have a sharps container, call your care team to get one. A special MedGuide will be given to you by the pharmacist with each prescription and refill. Be sure to read this information carefully each time. Talk to your care team about the use of this medication in children. While this medication may be prescribed for children as young as 8 years for selected conditions, precautions do apply. Overdosage: If you think you have taken too much of this medicine contact a poison control center or emergency room at once. NOTE: This medicine is only for you. Do not share this medicine with others. What if I miss a dose? If you miss a dose, take it as soon as you can. If it is almost time for your next  dose, take only that dose. Do not take double or extra doses. What may interact with this medication? Do not take this medication with any of the following: Chasteberry Cisapride Dronedarone Pimozide Thioridazine This medication may also interact with the following: Estrogen or progestin hormones Herbal or dietary supplements, like black cohosh or DHEA Other medications that cause heart rhythm changes Testosterone This list may not describe all possible interactions. Give your health care provider a list of all the medicines, herbs, non-prescription drugs, or dietary supplements you use. Also tell them if you smoke, drink alcohol, or use illegal drugs. Some items may interact with your medicine. What should I watch for while using this medication? Visit your care team for regular checks on your progress. During the first week, your symptoms may get worse, but then will improve as you continue your treatment. You may get hot flashes, increased bone pain, increased difficulty passing urine, or an aggravation of nerve symptoms. Discuss these effects with your care team, some of them may improve with continued use of this medication. Patients may experience a menstrual cycle or spotting during the first 2 months of therapy with this medication. If this continues, contact your care team. This medication may increase blood sugar. The risk may be higher in patients who already have diabetes. Ask your care team what you can do to lower your risk of diabetes while taking this medication. What side effects may I notice from receiving this medication? Side effects that you should report to your care team as soon as possible: Allergic reactions--skin rash,   itching, hives, swelling of the face, lips, tongue, or throat Heart attack--pain or tightness in the chest, shoulders, arms, or jaw, nausea, shortness of breath, cold or clammy skin, feeling faint or lightheaded Heart rhythm changes--fast or irregular  heartbeat, dizziness, feeling faint or lightheaded, chest pain, trouble breathing High blood sugar (hyperglycemia)--increased thirst or amount of urine, unusual weakness or fatigue, blurry vision Mood swings, irritability, hostility Seizures Stroke--sudden numbness or weakness of the face, arm, or leg, trouble speaking, confusion, trouble walking, loss of balance or coordination, dizziness, severe headache, change in vision Thoughts of suicide or self-harm, worsening mood, feelings of depression Side effects that usually do not require medical attention (report to your care team if they continue or are bothersome): Bone pain Change in sex drive or performance General discomfort and fatigue Hot flashes Muscle pain Pain, redness, or irritation at injection site Swelling of the ankles, hands, or feet This list may not describe all possible side effects. Call your doctor for medical advice about side effects. You may report side effects to FDA at 1-800-FDA-1088. Where should I keep my medication? Keep out of the reach of children and pets. Store below 25 degrees C (77 degrees F). Do not freeze. Protect from light. Get rid of any unused medication after the expiration date. To get rid of medications that are no longer needed or have expired: Take the medication to a medication take-back program. Check with your pharmacy or law enforcement to find a location. If you cannot return the medication, ask your pharmacist or care team how to get rid of this medication safely. NOTE: This sheet is a summary. It may not cover all possible information. If you have questions about this medicine, talk to your doctor, pharmacist, or health care provider.  2023 Elsevier/Gold Standard (2021-05-24 00:00:00)  

## 2022-08-27 NOTE — Progress Notes (Signed)
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[facility]) Her last CT imaging shows stability Overall, she tolerated treatment fairly well except for some intermittent mild hot flashes The plan will be to continue treatment indefinitely I plan to space out the interval of imaging study to every 6 months, her next imaging study will be due in August  Due to chronic antiestrogen therapy, I recommend the patient to get bone density scan repeated in May 2024  CKD (chronic kidney disease), stage III Her renal function is stable/slightly fluctuated Observe closely Discussed importance of oral fluid hydration  Orders Placed This Encounter  Procedures   CT ABDOMEN PELVIS W CONTRAST    Standing Status:   Future    Standing Expiration Date:   08/27/2023    Order Specific Question:   If indicated for the ordered procedure, I authorize the administration of contrast media per Radiology protocol    Answer:   Yes    Order Specific Question:   Does the patient have a contrast media/X-ray dye allergy?    Answer:   No    Order Specific Question:   Preferred imaging location?    Answer:   [facility]    Order Specific Question:   If indicated for the ordered procedure, I authorize the administration of oral contrast media per Radiology protocol    Answer:   Yes    All questions were answered. The patient knows to call the clinic with any problems, questions or concerns. The total time spent in the appointment was 25 minutes encounter with patients including review of chart and various tests results, discussions about plan of care and coordination of care plan   Artis Delay, MD 08/27/2022 2:22 PM  INTERVAL HISTORY: Please see below for problem oriented charting. she returns for treatment follow-up on  antiestrogen therapy She tolerated treatment well She is surprised to see elevated blood sugar and creatinine recently  REVIEW OF SYSTEMS:   Constitutional: Denies fevers, chills or abnormal weight loss Eyes: Denies blurriness of vision Ears, nose, mouth, throat, and face: Denies mucositis or sore throat Respiratory: Denies cough, dyspnea or wheezes Cardiovascular: Denies palpitation, chest discomfort or lower extremity swelling Gastrointestinal:  Denies nausea, heartburn or change in bowel habits Skin: Denies abnormal skin rashes Lymphatics: Denies new lymphadenopathy or easy bruising Neurological:Denies numbness, tingling or new weaknesses Behavioral/Psych: Mood is stable, no new changes  All other systems were reviewed with the patient and are negative.  I have reviewed the past medical history, past surgical history, social history and family history with the patient and they are unchanged from previous note.  ALLERGIES:  is allergic to codeine, hydrocodone-acetaminophen, thimerosal, thimerosal (thiomersal), ciprofloxacin, daypro [oxaprozin], levofloxacin, stadol [butorphanol tartrate], and sulfa antibiotics.  MEDICATIONS:  Current Outpatient Medications  Medication Sig Dispense Refill   acetaminophen (TYLENOL) 500 MG tablet Take 1,000 mg by mouth every 6 (six) hours as needed for moderate pain.     anastrozole (ARIMIDEX) 1 MG tablet Take 1 tablet (1 mg total) by mouth daily. 90 tablet 3   aspirin EC 81 MG tablet Take 81 mg by mouth daily. Swallow whole.     atorvastatin (LIPITOR) 40 MG tablet Take 1 tablet (40 mg total) by mouth daily. 90 tablet 3   Biotin 5 MG TABS Take 5 mg by  mouth daily.     buPROPion (WELLBUTRIN SR) 200 MG 12 hr tablet Take 1 tablet (200 mg total) by mouth 2 (two) times daily. 180 tablet 1   Ca Phosphate-Cholecalciferol (EQL CALCIUM GUMMIES) 250-400 MG-UNIT CHEW Chew 1 tablet by mouth 2 (two) times daily.     escitalopram (LEXAPRO) 20 MG tablet Take 1 tablet  (20 mg total) by mouth daily. 90 tablet 2   gabapentin (NEURONTIN) 300 MG capsule Take 1 capsule (300 mg total) by mouth 2 (two) times daily. 60 capsule 3   leuprolide (LUPRON) 30 MG injection Inject 30 mg into the muscle every 30 (thirty) days.     metFORMIN (GLUCOPHAGE) 500 MG tablet Take 1 tablet (500 mg total) by mouth every morning AND 2 tablets (1,000 mg total) every evening. Needs office visit. 270 tablet 1   mirabegron ER (MYRBETRIQ) 50 MG TB24 tablet Take 1 tablet (50 mg total) by mouth daily. 30 tablet 11   nitroGLYCERIN (NITROSTAT) 0.4 MG SL tablet Place 1 tablet (0.4 mg total) under the tongue every 5 (five) minutes as needed for chest pain. 25 tablet 3   omeprazole (PRILOSEC) 20 MG capsule Take 1 capsule (20 mg total) by mouth daily. 90 capsule 2   Polyethyl Glycol-Propyl Glycol (SYSTANE OP) Place 1 drop into both eyes daily as needed (dry eyes).     Probiotic Product (ALIGN PO) Take 1 tablet by mouth daily.      psyllium (METAMUCIL) 58.6 % powder Take 1 packet by mouth 2 (two) times daily.     Vitamin D, Ergocalciferol, (DRISDOL) 1.25 MG (50000 UNIT) CAPS capsule Take 1 capsule by mouth every 10 days. 9 capsule 1   No current facility-administered medications for this visit.    SUMMARY OF ONCOLOGIC HISTORY: Oncology History Overview Note  2018 - Core biopsy for ER/PR and Foundation One testing performed. Unfortunately, no sufficient tissue for Foundation One testing. ER was 50% and PR 90%. Progressed on carboplatin, exemestane, Avastin,Tamoxifen/Megace and letrozole, mixed response on Lupron  AMH: 06/23/19: 108 05/26/19: 9.04 01/05/19: 6.84 09/02/18: 3.72 06/01/18: 3.84 02/24/18: 2.6 11/21/17: 3.26 08/26/17: 1.77 05/27/17: 2.12 01/28/17: 2.47 06/10/16: 2.68 02/06/16: 1.84 05/12/15: 3.27 10/03/14: 2.12  Inhibin B 09/22/2019: 95.9 05/26/19: 90.2 01/05/19: 92 09/02/18: 70.9 06/04/18: 70 02/24/18: 79.5 11/21/17: 85.8 08/26/17: 65.6 05/27/17: 58.9 01/28/17: 198 06/10/16: 118.1 02/06/16:  62.4 05/12/15: 64.6 10/03/14: 58   Malignant granulosa cell tumor of ovary (HCC)  1994 Initial Diagnosis   1994   2002 Relapse/Recurrence   Upper abdominal recurrence, resected.     - 09/2000 Chemotherapy   6 cycles of IP cisplatin and etoposide    02/2009 Relapse/Recurrence   CT showed increased size of nodules in pelvis   2010 Surgery   Exlap with section of tumor nodules near cecum and left pelvic sidewall. Tumor: ER negative, PR positive    Treatment Plan Change   Alternated 2 week courses of Megace and Tamoxifen - ended 03/2012   03/2012 PET scan   CT - progressive disease   2013 Treatment Plan Change   Letrozole   01/2016 Imaging   MRI showed progressive disease   2017 Treatment Plan Change   Two weeks of alternating Tamoxifen 20mg  daily and then Megace 40mg  TID   08/2016 Imaging   MRI showed progressive disease with peritoneal implants near liver, in pelvis   01/2017 Treatment Plan Change   Lupron 11.25 q 3 months   11/2017 Imaging   Overall mixed response.   Mixed cystic/solid lesions in  the left pelvis are mildly improved.   Cystic peritoneal disease, including the dominant lesion along the posterior right hepatic lobe, is mildly progressed.   Subcapsular lesion along the posterior right hepatic lobe is unchanged.   03/2018 Imaging   MRI A/P: Mixed response of individual peritoneal metastases in the pelvis, as described above. Overall, there has been no significant change in bulk of disease.   Stable cystic peritoneal metastatic disease along the capsular surfaces of the liver and spleen.   No new sites of metastatic disease identified within the abdomen or pelvis.   08/2018 Imaging   MRI A/P: Status post hysterectomy and bilateral salpingo-oophorectomy.   Mixed cystic/solid peritoneal implants in the abdomen/pelvis, as above. Dominant cystic implant along the posterior liver surface is mildly increased. Remaining lesions are overall grossly  unchanged.   No new lesions are identified.   01/28/2019 Imaging   Mri A/P: 1. Relatively similar appearance of peritoneal metastasis. A posterior right hepatic capsular based lesion is similar to minimally decreased in size. Left pelvic implants are primarily similar with possible enlargement of an anterior high left pelvic cystic implant. No new disease identified. 2.  Aortic Atherosclerosis (ICD10-I70.0). 3. Hepatic steatosis. 4. Left adrenal adenoma.   06/02/2019 Imaging   MRI 1. Potential slight enlargement of dominant cystic area and solid component, associated with rind like signal variation on T2 along the inferior right hepatic margin, also potentially slightly increased. Findings may still be within the realm of measurement and technical variation. Close attention on follow-up. 2. Subtle cystic changes along the cephalad margin of the spleen are difficult to see on previous imaging, perhaps new compared with prior imaging studies. 3. Pelvic implants and left lower quadrant lesion with similar size, of the area along the left iliac vasculature may be slightly larger than on the prior study. 4. Signs of extensive retroperitoneal and pelvic lymphadenectomy. 5. Hepatic steatosis. 6. 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     06/08/2019 Cancer Staging   Staging form: Ovary, AJCC 7th Edition - Clinical: Stage IIIC (rT2, N1, M0) - Signed by Artis Delay, MD on 06/08/2019   06/10/2019 Imaging   1. 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 These findings are slightly worsened in comparison to a remote prior CT examination dated 10/04/2014.   2.  No evidence of metastatic disease in the chest.   3. Status post hysterectomy, pelvic and retroperitoneal lymph node  dissection, and ventral hernia mesh repair.   4.  Hepatic steatosis.   5.  Aortic Atherosclerosis (ICD10-I70.0).   06/24/2019 - 09/02/2019 Chemotherapy   The patient had carboplatin for chemotherapy treatment.     09/02/2019 Tumor Marker   Patient's tumor was tested for the following markers: Inhibin B Results of the tumor marker test revealed 94   09/23/2019 Imaging   1. Interval progression of the soft tissue lesions in the anterior left pelvis, likely peritoneal implants, compatible with disease progression. Remaining sites of apparent disease along the liver capsule and posterior spleen are stable 2. Stable 2 cm left adrenal adenoma. 3. Hepatic steatosis. 4. Right-side predominant colonic diverticulosis without diverticulitis. 5. Aortic Atherosclerosis (ICD10-I70.0).   12/23/2019 Imaging   1. Slight interval increase in size of a mixed solid and cystic nodule in the left hemipelvis measuring 3.0 x 2.9  cm, previously 2.7 x 2.1 cm. 2. Interval decrease in size of a nodule in the left paracolic gutter measuring 1.0 x 0.8 cm, previously 2.1 x 1.6 cm. 3. Stable peritoneal nodules in the hepatorenal recess and at the inferior tip of the spleen. 4. Unchanged nodule or lymph node overlying the left external iliac artery. 5. Enlargement of dominant left pelvic nodule is concerning for disease progression despite interval decrease in size of a nodule in the left paracolic gutter and stability of other nodules. 6. No evidence of metastatic disease in the chest. 7. Stable, benign left adrenal adenoma. 8. Ventral hernia mesh repair with a small component of recurrent hernia inferiorly, containing a single nonobstructed loop of small bowel. 9. Hepatic steatosis. 10. Aortic Atherosclerosis (ICD10-I70.0).   12/23/2019 Tumor Marker   Patient's tumor was tested for the following markers: Inhibin B Results of the tumor marker test revealed 94.9   01/25/2020 Tumor Marker   Patient's tumor was tested for  the following markers: Inhibin B Results of the tumor marker test revealed 116.7   02/20/2020 Genetic Testing   Negative genetic testing: no pathogenic variants detected in [facility] Multi-Cancer Panel.  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  The report date is February 20, 2020.   The Multi-Cancer Panel offered by [facility] includes sequencing and/or deletion duplication testing of the following 85 genes: AIP, ALK, APC, ATM, AXIN2,BAP1,  BARD1, BLM, BMPR1A, BRCA1, BRCA2, BRIP1, CASR, CDC73, CDH1, CDK4, CDKN1B, CDKN1C, CDKN2A (p14ARF), CDKN2A (p16INK4a), CEBPA, CHEK2, CTNNA1, DICER1, DIS3L2, EGFR (c.2369C>T, p.Thr790Met variant only), EPCAM (Deletion/duplication testing only), FH, FLCN, GATA2, GPC3, GREM1 (Promoter region deletion/duplication testing only), HOXB13 (c.251G>A, p.Gly84Glu), HRAS, KIT, MAX, MEN1, MET, MITF (c.952G>A, p.Glu318Lys variant only), MLH1, MSH2, MSH3, MSH6, MUTYH, NBN, NF1, NF2, NTHL1, PALB2, PDGFRA, PHOX2B, PMS2, POLD1, POLE, POT1, PRKAR1A, PTCH1, PTEN, RAD50, RAD51C, RAD51D, RB1, RECQL4, RET, RNF43, RUNX1, SDHAF2, SDHA (sequence changes only), SDHB, SDHC, SDHD, SMAD4, SMARCA4, SMARCB1, SMARCE1, STK11, SUFU, TERC, TERT, TMEM127, TP53, TSC1, TSC2, VHL, WRN and WT1.    03/02/2020 Tumor Marker   Patient's tumor was tested for the following markers: Inhibin B Results of the tumor marker test revealed 80.8   04/17/2020 Imaging   1. Overall, exam is stable. Multiple peritoneal nodules are again seen. The index nodule in the left lower quadrant is mildly increased in size in the interval. The lesion within the anterior left pelvis has decreased in size in the interval. There has also been decrease in size of cystic lesion within the a hepatorenal recess. The remaining peritoneal lesions are unchanged. No new lesions identified. 2. Stable left adrenal nodule. 3.  Aortic Atherosclerosis (ICD10-I70.0).     08/21/2020 Imaging   Bone density is normal AP  spine T score 0.8 Femoral neck on the left, T score -0.2 Femoral neck on the right ,T score -0.4   10/17/2020 Imaging   1. Interval progression of peritoneal nodules along the left pelvic sidewall, concerning for progressive metastatic disease. 2. Small capsular lesion medial right liver and the apparent cystic lesion superior to the right kidney are similar to prior. 3. Tiny soft tissue nodule anterior aspect of the lateral left pelvis described as decreasing on the prior study has decreased further on today's exam. 4. Stable left adrenal nodule. This cannot be definitively characterized. 5. Tiny nonobstructing stone lower pole right kidney. 6. Aortic Atherosclerosis (ICD10-I70.0).   10/26/2020 Procedure   Successful placement of a right internal jugular approach power injectable Port-A-Cath. The catheter is ready for  immediate use.       10/31/2020 - 10/04/2021 Chemotherapy   Patient is on Treatment Plan : OVARIAN Paclitaxel Z6,1,09,60 q28d     01/25/2021 Imaging   1. Signs of peritoneal disease with scattered peritoneal nodules. The index lesions are either stable or decreased in size in the interval as detailed above. No new sites of disease. 2. Ventral pelvic wall hernia contains a nonobstructed loop of small bowel. 3. Aortic Atherosclerosis (ICD10-I70.0).     06/14/2021 Imaging   1. Multiple peritoneal metastatic nodules are slightly diminished in size. 2. A left external iliac lymph node or nodule is however stable to slightly enlarged. 3. Overall findings are most consistent with continued treatment response of metastatic disease. 4. No evidence of metastatic disease within the chest. 5. Hepatic steatosis.     06/14/2021 Imaging   Right: Findings consistent with 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   Left: No evidence of thrombosis in the subclavian.   06/20/2021 Procedure   Successful right IJ vein  Port-A-Cath explant.     10/18/2021 Imaging   1. Similar to mild interval increase in size of left external iliac lymph node. 2. Multiple peritoneal metastatic nodules are grossly unchanged in size when compared to prior exam   02/14/2022 Imaging   1. Enlarging lymph node along the LEFT iliac chain, compatible with mild worsening of disease in this area. Stable stigmata of disease in the upper abdomen. 2. Post LEFT retroperitoneal and pelvic lymphadenectomy. 3. Stable LEFT adrenal adenoma. 4. Stable 4 mm pulmonary nodule in the LEFT lung base. 5. Aortic atherosclerosis. 6. Mild hepatic steatosis. 7. Post abdominal wall reconstruction with hernia below the mesh as on previous imaging.     05/24/2022 Imaging   1. Exam is generally stable with no substantial interval change and no clear trend towards improving or progressive disease. There is one tiny soft tissue nodule along the medial capsule of the posterior right liver measuring 8 x 5 mm, more conspicuous than on the prior study. Close attention on follow-up recommended. 2. A second capsular implant along the medial liver capsule more anteriorly measures smaller on today's exam. Additional capsular implants along the medial liver and inferior spleen are stable. The left iliac lymph node identified as enlarging on the previous study is not substantially changed in the interval. 3. Stable 2 cm left adrenal nodule, previously characterized as adenoma. 4. Stable 4 mm left lower lobe pulmonary nodule. 5. Similar appearance of ventral hernia repair with recurrence. 6.  Aortic Atherosclerosis (ICD10-I70.0).     PHYSICAL EXAMINATION: ECOG PERFORMANCE STATUS: 0 - Asymptomatic  Vitals:   08/27/22 1209  BP: 134/64  Pulse: 64  Resp: 18  SpO2: 100%   Filed Weights   08/27/22 1209  Weight: 190 lb (86.2 kg)    GENERAL:alert, no distress and comfortable  NEURO: alert & oriented x 3 with fluent speech, no focal motor/sensory  deficits  LABORATORY DATA:  I have reviewed the data as listed    Component Value Date/Time   NA 139 08/26/2022 1556   NA 140 07/09/2021 0813   NA 141 09/24/2013 1131   K 3.7 08/26/2022 1556   K 4.5 09/24/2013 1131   CL 104 08/26/2022 1556   CO2 26 08/26/2022 1556   CO2 24 09/24/2013 1131   GLUCOSE 154 (H) 08/26/2022 1556   GLUCOSE 90 09/24/2013 1131   BUN 22 08/26/2022 1556   BUN 20 07/09/2021 0813  BUN 21.2 09/24/2013 1131   CREATININE 1.08 (H) 08/26/2022 1556   CREATININE 1.19 (H) 06/20/2021 1513   CREATININE 1.1 09/24/2013 1131   CALCIUM 9.3 08/26/2022 1556   CALCIUM 9.5 09/24/2013 1131   PROT 6.6 08/26/2022 1556   PROT 6.7 11/02/2021 0000   PROT 6.8 09/24/2013 1131   ALBUMIN 4.1 08/26/2022 1556   ALBUMIN 4.4 11/02/2021 0000   ALBUMIN 3.9 09/24/2013 1131   AST 18 08/26/2022 1556   AST 21 06/20/2021 1513   AST 23 09/24/2013 1131   ALT 15 08/26/2022 1556   ALT 19 06/20/2021 1513   ALT 27 09/24/2013 1131   ALKPHOS 86 08/26/2022 1556   ALKPHOS 81 09/24/2013 1131   BILITOT 0.5 08/26/2022 1556   BILITOT 0.4 11/02/2021 0000   BILITOT 0.4 06/20/2021 1513   BILITOT 0.53 09/24/2013 1131   GFRNONAA 57 (L) 08/26/2022 1556   GFRNONAA 51 (L) 06/20/2021 1513   GFRAA 55 (L) 01/24/2020 1530   GFRAA 57 (L) 12/21/2019 1259    No results found for: "SPEP", "UPEP"  Lab Results  Component Value Date   WBC 5.9 08/26/2022   NEUTROABS 3.5 08/26/2022   HGB 12.9 08/26/2022   HCT 38.6 08/26/2022   MCV 88.1 08/26/2022   PLT 294 08/26/2022      Chemistry      Component Value Date/Time   NA 139 08/26/2022 1556   NA 140 07/09/2021 0813   NA 141 09/24/2013 1131   K 3.7 08/26/2022 1556   K 4.5 09/24/2013 1131   CL 104 08/26/2022 1556   CO2 26 08/26/2022 1556   CO2 24 09/24/2013 1131   BUN 22 08/26/2022 1556   BUN 20 07/09/2021 0813   BUN 21.2 09/24/2013 1131   CREATININE 1.08 (H) 08/26/2022 1556   CREATININE 1.19 (H) 06/20/2021 1513   CREATININE 1.1 09/24/2013 1131       Component Value Date/Time   CALCIUM 9.3 08/26/2022 1556   CALCIUM 9.5 09/24/2013 1131   ALKPHOS 86 08/26/2022 1556   ALKPHOS 81 09/24/2013 1131   AST 18 08/26/2022 1556   AST 21 06/20/2021 1513   AST 23 09/24/2013 1131   ALT 15 08/26/2022 1556   ALT 19 06/20/2021 1513   ALT 27 09/24/2013 1131   BILITOT 0.5 08/26/2022 1556   BILITOT 0.4 11/02/2021 0000   BILITOT 0.4 06/20/2021 1513   BILITOT 0.53 09/24/2013 1131

## 2022-08-27 NOTE — Assessment & Plan Note (Signed)
Her last CT imaging shows stability Overall, she tolerated treatment fairly well except for some intermittent mild hot flashes The plan will be to continue treatment indefinitely I plan to space out the interval of imaging study to every 6 months, her next imaging study will be due in August  Due to chronic antiestrogen therapy, I recommend the patient to get bone density scan repeated in May 2024 

## 2022-08-27 NOTE — Assessment & Plan Note (Signed)
Her renal function is stable/slightly fluctuated Observe closely Discussed importance of oral fluid hydration

## 2022-08-28 DIAGNOSIS — Z13820 Encounter for screening for osteoporosis: Secondary | ICD-10-CM | POA: Diagnosis not present

## 2022-08-28 DIAGNOSIS — N907 Vulvar cyst: Secondary | ICD-10-CM | POA: Diagnosis not present

## 2022-09-18 ENCOUNTER — Encounter: Payer: Self-pay | Admitting: Hematology and Oncology

## 2022-09-24 ENCOUNTER — Other Ambulatory Visit: Payer: Self-pay

## 2022-09-24 ENCOUNTER — Inpatient Hospital Stay: Payer: 59

## 2022-09-24 ENCOUNTER — Inpatient Hospital Stay: Payer: 59 | Attending: Hematology

## 2022-09-24 VITALS — BP 107/46 | HR 56 | Temp 97.8°F | Resp 18

## 2022-09-24 DIAGNOSIS — C569 Malignant neoplasm of unspecified ovary: Secondary | ICD-10-CM | POA: Diagnosis not present

## 2022-09-24 LAB — CBC WITH DIFFERENTIAL/PLATELET
Abs Immature Granulocytes: 0.01 10*3/uL (ref 0.00–0.07)
Basophils Absolute: 0 10*3/uL (ref 0.0–0.1)
Basophils Relative: 1 %
Eosinophils Absolute: 0.2 10*3/uL (ref 0.0–0.5)
Eosinophils Relative: 3 %
HCT: 38.4 % (ref 36.0–46.0)
Hemoglobin: 12.8 g/dL (ref 12.0–15.0)
Immature Granulocytes: 0 %
Lymphocytes Relative: 35 %
Lymphs Abs: 1.9 10*3/uL (ref 0.7–4.0)
MCH: 29.4 pg (ref 26.0–34.0)
MCHC: 33.3 g/dL (ref 30.0–36.0)
MCV: 88.3 fL (ref 80.0–100.0)
Monocytes Absolute: 0.6 10*3/uL (ref 0.1–1.0)
Monocytes Relative: 11 %
Neutro Abs: 2.7 10*3/uL (ref 1.7–7.7)
Neutrophils Relative %: 50 %
Platelets: 299 10*3/uL (ref 150–400)
RBC: 4.35 MIL/uL (ref 3.87–5.11)
RDW: 12.4 % (ref 11.5–15.5)
WBC: 5.4 10*3/uL (ref 4.0–10.5)
nRBC: 0 % (ref 0.0–0.2)

## 2022-09-24 LAB — COMPREHENSIVE METABOLIC PANEL
ALT: 17 U/L (ref 0–44)
AST: 19 U/L (ref 15–41)
Albumin: 4.2 g/dL (ref 3.5–5.0)
Alkaline Phosphatase: 79 U/L (ref 38–126)
Anion gap: 7 (ref 5–15)
BUN: 21 mg/dL (ref 8–23)
CO2: 29 mmol/L (ref 22–32)
Calcium: 9.3 mg/dL (ref 8.9–10.3)
Chloride: 104 mmol/L (ref 98–111)
Creatinine, Ser: 1.06 mg/dL — ABNORMAL HIGH (ref 0.44–1.00)
GFR, Estimated: 58 mL/min — ABNORMAL LOW (ref >60)
Glucose, Bld: 111 mg/dL — ABNORMAL HIGH (ref 70–99)
Potassium: 3.9 mmol/L (ref 3.5–5.1)
Sodium: 140 mmol/L (ref 135–145)
Total Bilirubin: 0.4 mg/dL (ref 0.3–1.2)
Total Protein: 6.9 g/dL (ref 6.5–8.1)

## 2022-09-24 MED ORDER — LEUPROLIDE ACETATE 3.75 MG IM KIT
3.7500 mg | PACK | Freq: Once | INTRAMUSCULAR | Status: AC
  Administered 2022-09-24: 3.75 mg via INTRAMUSCULAR
  Filled 2022-09-24: qty 3.75

## 2022-10-03 ENCOUNTER — Other Ambulatory Visit (HOSPITAL_COMMUNITY): Payer: Self-pay

## 2022-10-03 ENCOUNTER — Ambulatory Visit (INDEPENDENT_AMBULATORY_CARE_PROVIDER_SITE_OTHER): Payer: 59 | Admitting: Physician Assistant

## 2022-10-03 ENCOUNTER — Encounter (INDEPENDENT_AMBULATORY_CARE_PROVIDER_SITE_OTHER): Payer: Self-pay | Admitting: Physician Assistant

## 2022-10-03 VITALS — BP 136/77 | HR 54 | Temp 97.8°F | Ht 66.0 in | Wt 187.0 lb

## 2022-10-03 DIAGNOSIS — F3289 Other specified depressive episodes: Secondary | ICD-10-CM

## 2022-10-03 DIAGNOSIS — R7303 Prediabetes: Secondary | ICD-10-CM | POA: Diagnosis not present

## 2022-10-03 DIAGNOSIS — E669 Obesity, unspecified: Secondary | ICD-10-CM | POA: Diagnosis not present

## 2022-10-03 DIAGNOSIS — F32A Depression, unspecified: Secondary | ICD-10-CM

## 2022-10-03 DIAGNOSIS — E559 Vitamin D deficiency, unspecified: Secondary | ICD-10-CM

## 2022-10-03 DIAGNOSIS — Z6833 Body mass index (BMI) 33.0-33.9, adult: Secondary | ICD-10-CM

## 2022-10-03 MED ORDER — TOPIRAMATE 25 MG PO TABS
25.0000 mg | ORAL_TABLET | Freq: Every day | ORAL | 3 refills | Status: DC
Start: 2022-10-03 — End: 2023-01-28
  Filled 2022-10-03: qty 45, 26d supply, fill #0

## 2022-10-03 MED ORDER — BUPROPION HCL ER (SR) 200 MG PO TB12
200.0000 mg | ORAL_TABLET | Freq: Two times a day (BID) | ORAL | 1 refills | Status: DC
Start: 2022-10-03 — End: 2022-12-26
  Filled 2022-10-03: qty 180, 90d supply, fill #0

## 2022-10-03 MED ORDER — VITAMIN D (ERGOCALCIFEROL) 1.25 MG (50000 UNIT) PO CAPS
50000.0000 [IU] | ORAL_CAPSULE | ORAL | 1 refills | Status: DC
Start: 2022-10-03 — End: 2022-12-26
  Filled 2022-10-03: qty 9, 90d supply, fill #0

## 2022-10-03 MED ORDER — METFORMIN HCL 500 MG PO TABS
ORAL_TABLET | ORAL | 1 refills | Status: DC
Start: 2022-10-03 — End: 2022-12-26
  Filled 2022-10-03: qty 270, 90d supply, fill #0

## 2022-10-03 NOTE — Progress Notes (Signed)
.smr  Office: [phone]  /  Fax: [phone]  WEIGHT SUMMARY AND BIOMETRICS  Vitals Temp: 97.8 F (36.6 C) BP: 136/77 Pulse Rate: (!) 54 SpO2: 98 %   Anthropometric Measurements Height: 5\' 6"  (1.676 m) Weight: 187 lb (84.8 kg) BMI (Calculated): 30.2 Weight at Last Visit: 185 lb Weight Lost Since Last Visit: 0 Weight Gained Since Last Visit: 2 lb Starting Weight: 209 lb   Body Composition  Body Fat %: 40.7 % Fat Mass (lbs): 76.4 lbs Muscle Mass (lbs): 105.8 lbs Total Body Water (lbs): 73.2 lbs Visceral Fat Rating : 11   Other Clinical Data Fasting: no Labs: Yes Starting Date: 04/29/18     HPI  Chief Complaint: OBESITY  [name] is here to discuss her progress with her obesity treatment plan. She is on the {MWMwtlossportion/plan2:23431} and states she is following her eating plan approximately *** % of the time. She states she is exercising *** minutes *** times per week.   Interval History:  Since last office visit she *** Hunger/appetite-*** Cravings- *** Stress- *** Sleep- *** Exercise-*** Hydration-***   Pharmacotherapy: ***  TREATMENT PLAN FOR OBESITY:  Recommended Dietary Goals  Beya is currently in the action stage of change. As such, her goal is to continue weight management plan. She has agreed to {MWMwtlossportion/plan2:23431}.  Behavioral Intervention  We discussed the following Behavioral Modification Strategies today: {EMWMwtlossstrategies:28914::"increasing lean protein intake","decreasing simple carbohydrates ","increasing vegetables","increasing lower glycemic fruits","increasing water intake","continue to practice mindfulness when eating","planning for success"}.  Additional resources provided today: NA  Recommended Physical Activity Goals  Makennah has been advised to work up to 150 minutes of moderate intensity aerobic activity a week and strengthening exercises 2-3 times per week for cardiovascular health, weight loss  maintenance and preservation of muscle mass.   She has agreed to {EMEXERCISE:28847::"Think about ways to increase daily physical activity and overcoming barriers to exercise"}   Pharmacotherapy We discussed various medication options to help Yazmen with her weight loss efforts and we both agreed to ***.    Return in about 12 weeks (around 12/26/2022).Marland Kitchen She was informed of the importance of frequent follow up visits to maximize her success with intensive lifestyle modifications for her multiple health conditions.  PHYSICAL EXAM:  Blood pressure 136/77, pulse (!) 54, temperature 97.8 F (36.6 C), height 5\' 6"  (1.676 m), weight 187 lb (84.8 kg), SpO2 98 %. Body mass index is 30.18 kg/m.  General: She is overweight, cooperative, alert, well developed, and in no acute distress. PSYCH: Has normal mood, affect and thought process.   Lungs: Normal breathing effort, no conversational dyspnea.  DIAGNOSTIC DATA REVIEWED:  BMET    Component Value Date/Time   NA 140 09/24/2022 1531   NA 140 07/09/2021 0813   NA 141 09/24/2013 1131   K 3.9 09/24/2022 1531   K 4.5 09/24/2013 1131   CL 104 09/24/2022 1531   CO2 29 09/24/2022 1531   CO2 24 09/24/2013 1131   GLUCOSE 111 (H) 09/24/2022 1531   GLUCOSE 90 09/24/2013 1131   BUN 21 09/24/2022 1531   BUN 20 07/09/2021 0813   BUN 21.2 09/24/2013 1131   CREATININE 1.06 (H) 09/24/2022 1531   CREATININE 1.19 (H) 06/20/2021 1513   CREATININE 1.1 09/24/2013 1131   CALCIUM 9.3 09/24/2022 1531   CALCIUM 9.5 09/24/2013 1131   GFRNONAA 58 (L) 09/24/2022 1531   GFRNONAA 51 (L) 06/20/2021 1513   GFRAA 55 (L) 01/24/2020 1530   GFRAA 57 (L) 12/21/2019 1259   Lab Results  Component  Value Date   HGBA1C 5.5 02/12/2022   HGBA1C 5.5 04/29/2018   Lab Results  Component Value Date   INSULIN 17.4 02/12/2022   INSULIN 25.8 (H) 04/29/2018   Lab Results  Component Value Date   TSH 0.836 02/12/2022   CBC    Component Value Date/Time   WBC 5.4  09/24/2022 1531   RBC 4.35 09/24/2022 1531   HGB 12.8 09/24/2022 1531   HGB 12.9 08/09/2021 1107   HGB 13.4 07/09/2021 0813   HCT 38.4 09/24/2022 1531   HCT 38.9 07/09/2021 0813   PLT 299 09/24/2022 1531   PLT 315 08/09/2021 1107   PLT 349 07/09/2021 0813   MCV 88.3 09/24/2022 1531   MCV 91 07/09/2021 0813   MCH 29.4 09/24/2022 1531   MCHC 33.3 09/24/2022 1531   RDW 12.4 09/24/2022 1531   RDW 13.5 07/09/2021 0813   Iron Studies No results found for: "IRON", "TIBC", "FERRITIN", "IRONPCTSAT" Lipid Panel     Component Value Date/Time   CHOL 255 (H) 07/09/2021 0813   TRIG 126 07/09/2021 0813   HDL 50 07/09/2021 0813   LDLCALC 182 (H) 07/09/2021 0813   Hepatic Function Panel     Component Value Date/Time   PROT 6.9 09/24/2022 1531   PROT 6.7 11/02/2021 0000   PROT 6.8 09/24/2013 1131   ALBUMIN 4.2 09/24/2022 1531   ALBUMIN 4.4 11/02/2021 0000   ALBUMIN 3.9 09/24/2013 1131   AST 19 09/24/2022 1531   AST 21 06/20/2021 1513   AST 23 09/24/2013 1131   ALT 17 09/24/2022 1531   ALT 19 06/20/2021 1513   ALT 27 09/24/2013 1131   ALKPHOS 79 09/24/2022 1531   ALKPHOS 81 09/24/2013 1131   BILITOT 0.4 09/24/2022 1531   BILITOT 0.4 11/02/2021 0000   BILITOT 0.4 06/20/2021 1513   BILITOT 0.53 09/24/2013 1131   BILIDIR 0.12 11/02/2021 0000      Component Value Date/Time   TSH 0.836 02/12/2022 0844   Nutritional Lab Results  Component Value Date   VD25OH 81.7 02/12/2022   VD25OH 38.6 07/09/2021   VD25OH 59.0 11/30/2019    ASSOCIATED CONDITIONS ADDRESSED TODAY  ASSESSMENT AND PLAN  Problem List Items Addressed This Visit     Vitamin D deficiency   Relevant Medications   Vitamin D, Ergocalciferol, (DRISDOL) 1.25 MG (50000 UNIT) CAPS capsule   Other Relevant Orders   VITAMIN D 25 Hydroxy (Vit-D Deficiency, Fractures)   Prediabetes - Primary   Relevant Medications   metFORMIN (GLUCOPHAGE) 500 MG tablet   Other Relevant Orders   Hemoglobin A1c   Depression    Relevant Medications   topiramate (TOPAMAX) 25 MG tablet   buPROPion (WELLBUTRIN SR) 200 MG 12 hr tablet   Generalized obesity- Start BMI 33.73   Relevant Medications   metFORMIN (GLUCOPHAGE) 500 MG tablet  Prediabetes Last A1c was 5.5/ Insulin 17.4- not at goal  Medication(s): metformin 500 mg in am and 1000 mg in evening with meals. No GI side effects with metformin.  Polyphagia:No Lab Results  Component Value Date   HGBA1C 5.5 02/12/2022   HGBA1C 5.4 07/09/2021   HGBA1C 5.2 11/30/2019   HGBA1C 5.3 05/31/2019   HGBA1C 5.2 11/19/2018   Lab Results  Component Value Date   INSULIN 17.4 02/12/2022   INSULIN 17.1 07/09/2021   INSULIN 23.8 11/30/2019   INSULIN 16.5 05/31/2019   INSULIN 17.6 11/19/2018    Plan: Continue and refill  metformin 500 mg in am and 1000 mg every evening with  meals.  Continue working on nutrition plan to decrease simple carbohydrates, increase lean proteins and exercise to promote weight loss, improve glycemic control and prevent progression to Type 2 diabetes.  Recheck A1c today.   Vitamin D Deficiency Vitamin D is at goal of 50.  Most recent vitamin D level was 81.7. She is on  prescription ergocalciferol 50,000 IU weekly. Lab Results  Component Value Date   VD25OH 81.7 02/12/2022   VD25OH 38.6 07/09/2021   VD25OH 59.0 11/30/2019    Plan:  Continue and refill Ergocalciferol 50,000 unit capsules every 10 days.  Recheck vitamin D level today.  Low vitamin D levels can be associated with adiposity and may result in leptin resistance and weight gain. Also associated with fatigue. Currently on vitamin D supplementation without any adverse effects.        Other depression/emotional eating Abrial has had issues with stress/emotional eating. Currently this is moderately controlled. Overall mood is stable. Medication(s): Bupropion SR 200 mg twice daily and Topiramate 25-50 mg at bedtime nightly. No side effects with bupropion or  topiramate  Plan: Continue and refill Bupropion SR 200 mg twice daily and Topiramate 25 - 50 mg at bedtime .    ATTESTASTION STATEMENTS:  Reviewed by clinician on day of visit: allergies, medications, problem list, medical history, surgical history, family history, social history, and previous encounter notes.   I have personally spent 30 minutes total time today in preparation, patient care, nutritional counseling and documentation for this visit, including the following: review of clinical lab tests; review of medical tests/procedures/services.      Shatima Zalar, PA-C

## 2022-10-04 LAB — VITAMIN D 25 HYDROXY (VIT D DEFICIENCY, FRACTURES): Vit D, 25-Hydroxy: 75.6 ng/mL (ref 30.0–100.0)

## 2022-10-04 LAB — HEMOGLOBIN A1C
Est. average glucose Bld gHb Est-mCnc: 117 mg/dL
Hgb A1c MFr Bld: 5.7 % — ABNORMAL HIGH (ref 4.8–5.6)

## 2022-10-14 ENCOUNTER — Other Ambulatory Visit (HOSPITAL_COMMUNITY): Payer: Self-pay

## 2022-10-14 ENCOUNTER — Other Ambulatory Visit: Payer: Self-pay

## 2022-10-14 ENCOUNTER — Other Ambulatory Visit: Payer: Self-pay | Admitting: Internal Medicine

## 2022-10-14 MED ORDER — ATORVASTATIN CALCIUM 40 MG PO TABS
ORAL_TABLET | ORAL | 0 refills | Status: DC
  Filled 2022-10-14: qty 90, 90d supply, fill #0

## 2022-10-23 ENCOUNTER — Inpatient Hospital Stay: Payer: 59 | Attending: Hematology

## 2022-10-23 ENCOUNTER — Inpatient Hospital Stay: Payer: 59

## 2022-10-23 ENCOUNTER — Other Ambulatory Visit: Payer: Self-pay

## 2022-10-23 VITALS — BP 102/48 | HR 55 | Temp 98.6°F | Resp 18

## 2022-10-23 DIAGNOSIS — C569 Malignant neoplasm of unspecified ovary: Secondary | ICD-10-CM | POA: Diagnosis not present

## 2022-10-23 LAB — COMPREHENSIVE METABOLIC PANEL
ALT: 15 U/L (ref 0–44)
AST: 16 U/L (ref 15–41)
Albumin: 4 g/dL (ref 3.5–5.0)
Alkaline Phosphatase: 86 U/L (ref 38–126)
Anion gap: 8 (ref 5–15)
BUN: 29 mg/dL — ABNORMAL HIGH (ref 8–23)
CO2: 26 mmol/L (ref 22–32)
Calcium: 9.7 mg/dL (ref 8.9–10.3)
Chloride: 103 mmol/L (ref 98–111)
Creatinine, Ser: 1.12 mg/dL — ABNORMAL HIGH (ref 0.44–1.00)
GFR, Estimated: 55 mL/min — ABNORMAL LOW (ref >60)
Glucose, Bld: 94 mg/dL (ref 70–99)
Potassium: 4 mmol/L (ref 3.5–5.1)
Sodium: 137 mmol/L (ref 135–145)
Total Bilirubin: 0.3 mg/dL (ref 0.3–1.2)
Total Protein: 6.3 g/dL — ABNORMAL LOW (ref 6.5–8.1)

## 2022-10-23 MED ORDER — LEUPROLIDE ACETATE 3.75 MG IM KIT
3.7500 mg | PACK | Freq: Once | INTRAMUSCULAR | Status: AC
  Administered 2022-10-23: 3.75 mg via INTRAMUSCULAR
  Filled 2022-10-23: qty 3.75

## 2022-11-11 ENCOUNTER — Other Ambulatory Visit: Payer: Self-pay

## 2022-11-18 ENCOUNTER — Ambulatory Visit (HOSPITAL_COMMUNITY)
Admission: RE | Admit: 2022-11-18 | Discharge: 2022-11-18 | Disposition: A | Discharge status: Home / Self Care | Payer: 59 | Source: Ambulatory Visit | Attending: Hematology and Oncology | Admitting: Hematology and Oncology

## 2022-11-18 DIAGNOSIS — C569 Malignant neoplasm of unspecified ovary: Secondary | ICD-10-CM | POA: Insufficient documentation

## 2022-11-18 DIAGNOSIS — K760 Fatty (change of) liver, not elsewhere classified: Secondary | ICD-10-CM | POA: Diagnosis not present

## 2022-11-18 DIAGNOSIS — K5730 Diverticulosis of large intestine without perforation or abscess without bleeding: Secondary | ICD-10-CM | POA: Diagnosis not present

## 2022-11-18 MED ORDER — IOHEXOL 300 MG/ML  SOLN
100.0000 mL | Freq: Once | INTRAMUSCULAR | Status: AC | PRN
  Administered 2022-11-18: 100 mL via INTRAVENOUS

## 2022-11-18 MED ORDER — IOHEXOL 9 MG/ML PO SOLN
1000.0000 mL | Freq: Once | ORAL | Status: AC
  Administered 2022-11-18: 1000 mL via ORAL

## 2022-11-20 ENCOUNTER — Telehealth: Payer: Self-pay | Admitting: *Deleted

## 2022-11-20 NOTE — Telephone Encounter (Signed)
This RN called the reading per noted exam done 7/29 to request reading per appt in am for MD review

## 2022-11-21 ENCOUNTER — Inpatient Hospital Stay: Payer: 59 | Attending: Hematology | Admitting: Hematology and Oncology

## 2022-11-21 ENCOUNTER — Other Ambulatory Visit: Payer: Self-pay

## 2022-11-21 ENCOUNTER — Encounter: Payer: Self-pay | Admitting: Hematology and Oncology

## 2022-11-21 ENCOUNTER — Inpatient Hospital Stay: Payer: 59

## 2022-11-21 VITALS — BP 126/70 | HR 55 | Resp 18 | Ht 66.0 in | Wt 188.8 lb

## 2022-11-21 DIAGNOSIS — C786 Secondary malignant neoplasm of retroperitoneum and peritoneum: Secondary | ICD-10-CM | POA: Insufficient documentation

## 2022-11-21 DIAGNOSIS — C569 Malignant neoplasm of unspecified ovary: Secondary | ICD-10-CM | POA: Diagnosis not present

## 2022-11-21 DIAGNOSIS — Z79811 Long term (current) use of aromatase inhibitors: Secondary | ICD-10-CM | POA: Insufficient documentation

## 2022-11-21 DIAGNOSIS — N183 Chronic kidney disease, stage 3 unspecified: Secondary | ICD-10-CM | POA: Diagnosis not present

## 2022-11-21 LAB — COMPREHENSIVE METABOLIC PANEL
ALT: 15 U/L (ref 0–44)
AST: 18 U/L (ref 15–41)
Albumin: 4.2 g/dL (ref 3.5–5.0)
Alkaline Phosphatase: 83 U/L (ref 38–126)
Anion gap: 8 (ref 5–15)
BUN: 23 mg/dL (ref 8–23)
CO2: 29 mmol/L (ref 22–32)
Calcium: 9.4 mg/dL (ref 8.9–10.3)
Chloride: 101 mmol/L (ref 98–111)
Creatinine, Ser: 1.18 mg/dL — ABNORMAL HIGH (ref 0.44–1.00)
GFR, Estimated: 51 mL/min — ABNORMAL LOW (ref >60)
Glucose, Bld: 84 mg/dL (ref 70–99)
Potassium: 4 mmol/L (ref 3.5–5.1)
Sodium: 138 mmol/L (ref 135–145)
Total Bilirubin: 0.6 mg/dL (ref 0.3–1.2)
Total Protein: 6.9 g/dL (ref 6.5–8.1)

## 2022-11-21 LAB — CBC WITH DIFFERENTIAL/PLATELET
Abs Immature Granulocytes: 0.01 10*3/uL (ref 0.00–0.07)
Basophils Absolute: 0.1 10*3/uL (ref 0.0–0.1)
Basophils Relative: 1 %
Eosinophils Absolute: 0.1 10*3/uL (ref 0.0–0.5)
Eosinophils Relative: 2 %
HCT: 40.5 % (ref 36.0–46.0)
Hemoglobin: 13.5 g/dL (ref 12.0–15.0)
Immature Granulocytes: 0 %
Lymphocytes Relative: 26 %
Lymphs Abs: 1.5 10*3/uL (ref 0.7–4.0)
MCH: 28.9 pg (ref 26.0–34.0)
MCHC: 33.3 g/dL (ref 30.0–36.0)
MCV: 86.7 fL (ref 80.0–100.0)
Monocytes Absolute: 0.6 10*3/uL (ref 0.1–1.0)
Monocytes Relative: 10 %
Neutro Abs: 3.5 10*3/uL (ref 1.7–7.7)
Neutrophils Relative %: 61 %
Platelets: 304 10*3/uL (ref 150–400)
RBC: 4.67 MIL/uL (ref 3.87–5.11)
RDW: 12.8 % (ref 11.5–15.5)
WBC: 5.7 10*3/uL (ref 4.0–10.5)
nRBC: 0 % (ref 0.0–0.2)

## 2022-11-21 NOTE — Progress Notes (Signed)
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   All questions were answered. The patient knows to call the clinic with any problems, questions or concerns. The total time spent in the appointment was 30 minutes encounter with patients including review of chart and various tests results, discussions about plan of care and coordination of care plan   Artis Delay, MD 11/21/2022 9:50 AM  INTERVAL HISTORY: Please see below for problem oriented charting. she returns for treatment follow-up . She tolerated treatment well except for hot flashes but tolerable We spent a lot of time reviewing imaging study and discussed plan of care  REVIEW OF SYSTEMS:   Constitutional: Denies fevers, chills or abnormal weight loss Eyes: Denies blurriness of vision Ears, nose, mouth, throat, and face: Denies mucositis or sore throat Respiratory: Denies cough, dyspnea or wheezes Cardiovascular: Denies palpitation, chest discomfort or lower extremity swelling Gastrointestinal:  Denies nausea, heartburn or change in bowel habits Skin: Denies abnormal skin rashes Lymphatics: Denies new lymphadenopathy or easy bruising Neurological:Denies numbness, tingling or new  weaknesses Behavioral/Psych: Mood is stable, no new changes  All other systems were reviewed with the patient and are negative.  I have reviewed the past medical history, past surgical history, social history and family history with the patient and they are unchanged from previous note.  ALLERGIES:  is allergic to codeine, hydrocodone-acetaminophen, thimerosal, thimerosal (thiomersal), ciprofloxacin, daypro [oxaprozin], levofloxacin, stadol [butorphanol tartrate], and sulfa antibiotics.  MEDICATIONS:  Current Outpatient Medications  Medication Sig Dispense Refill   acetaminophen (TYLENOL) 500 MG tablet Take 1,000 mg by mouth every 6 (six) hours as needed for moderate pain.     anastrozole (ARIMIDEX) 1 MG tablet Take 1 tablet (1 mg total) by mouth daily. 90 tablet 3   aspirin EC 81 MG tablet Take 81 mg by mouth daily. Swallow whole.     atorvastatin (LIPITOR) 40 MG tablet Take 1 tablet by mouth daily. Please call [phone] to schedule a September appointment for future refills. Thank  you. 90 tablet 0   Biotin 5 MG TABS Take 5 mg by mouth daily.     buPROPion (WELLBUTRIN SR) 200 MG 12 hr tablet Take 1 tablet (200 mg total) by mouth 2 (two) times daily. 180 tablet 1   Ca Phosphate-Cholecalciferol (EQL CALCIUM GUMMIES) 250-400 MG-UNIT CHEW Chew 1 tablet by mouth 2 (two) times daily.     escitalopram (LEXAPRO) 20 MG tablet Take 1 tablet (20 mg total) by mouth daily. 90 tablet 2   gabapentin (NEURONTIN) 300 MG capsule Take 1 capsule (300 mg total) by mouth 2 (two) times daily. 60 capsule 3   leuprolide (LUPRON) 30 MG injection Inject 30 mg into the muscle every 30 (thirty) days.     metFORMIN (GLUCOPHAGE) 500 MG tablet Take 1 tablet (500 mg total)  by mouth every morning AND 2 tablets (1,000 mg total) every evening. Needs office visit. 270 tablet 1   mirabegron ER (MYRBETRIQ) 50 MG TB24 tablet Take 1 tablet (50 mg total) by mouth daily. 30 tablet 11   nitroGLYCERIN (NITROSTAT) 0.4 MG SL tablet  Place 1 tablet (0.4 mg total) under the tongue every 5 (five) minutes as needed for chest pain. 25 tablet 3   omeprazole (PRILOSEC) 20 MG capsule Take 1 capsule (20 mg total) by mouth daily. 90 capsule 2   Polyethyl Glycol-Propyl Glycol (SYSTANE OP) Place 1 drop into both eyes daily as needed (dry eyes).     Probiotic Product (ALIGN PO) Take 1 tablet by mouth daily.      psyllium (METAMUCIL) 58.6 % powder Take 1 packet by mouth 2 (two) times daily.     topiramate (TOPAMAX) 25 MG tablet Take 1 tablet (25 mg total) by mouth at bedtime. Increase to 2 tablets (50 mg) at bedtime if tolerating well after 1 week. 45 tablet 3   Vitamin D, Ergocalciferol, (DRISDOL) 1.25 MG (50000 UNIT) CAPS capsule Take 1 capsule by mouth every 10 days. 9 capsule 1   No current facility-administered medications for this visit.    SUMMARY OF ONCOLOGIC HISTORY: Oncology History Overview Note  2018 - Core biopsy for ER/PR and Foundation One testing performed. Unfortunately, no sufficient tissue for Foundation One testing. ER was 50% and PR 90%. 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     - 09/2000 Chemotherapy   6 cycles of IP cisplatin and etoposide    02/2009 Relapse/Recurrence   CT showed increased size of nodules in pelvis   2010 Surgery   Exlap with section of tumor nodules near cecum  and left pelvic sidewall. Tumor: ER negative, PR positive    Treatment Plan Change   Alternated 2 week courses of Megace and Tamoxifen - ended 03/2012   03/2012 PET scan   CT - progressive disease   2013 Treatment Plan Change   Letrozole   01/2016 Imaging   MRI showed progressive disease   2017 Treatment Plan Change   Two weeks of alternating Tamoxifen 20mg  daily and then Megace 40mg  TID   08/2016 Imaging   MRI showed progressive disease with peritoneal implants near liver, in pelvis   01/2017 Treatment Plan Change   Lupron 11.25 q 3 months   11/2017 Imaging   Overall mixed response.   Mixed cystic/solid lesions in the left pelvis are mildly improved.   Cystic peritoneal disease, including the dominant lesion along the posterior right hepatic lobe, is mildly progressed.   Subcapsular lesion along the posterior right hepatic lobe is unchanged.   03/2018 Imaging   MRI A/P: Mixed response of individual peritoneal metastases in the pelvis, as described above. Overall, there has been no significant change in bulk of disease.   Stable cystic peritoneal metastatic disease along the capsular surfaces of the liver and spleen.   No new sites of  metastatic disease identified within the abdomen or pelvis.   08/2018 Imaging   MRI A/P: Status post hysterectomy and bilateral salpingo-oophorectomy.   Mixed cystic/solid peritoneal implants in the abdomen/pelvis, as above. Dominant cystic implant along the posterior liver surface is mildly increased. Remaining lesions are overall grossly unchanged.   No new lesions are identified.   01/28/2019 Imaging   Mri A/P: 1. Relatively similar appearance of peritoneal metastasis. A posterior right hepatic capsular based lesion is similar to minimally decreased in size. Left pelvic implants are primarily similar with possible enlargement of an anterior high left pelvic cystic implant. No new disease identified. 2.  Aortic Atherosclerosis  (ICD10-I70.0). 3. Hepatic steatosis. 4. Left adrenal adenoma.   06/02/2019 Imaging   MRI 1. Potential slight enlargement of dominant cystic area and solid component, associated with rind like signal variation on T2 along the inferior right hepatic margin, also potentially slightly increased. Findings may still be within the realm of measurement and technical variation. Close attention on follow-up. 2. Subtle cystic changes along the cephalad margin of the spleen are difficult to see on previous imaging, perhaps new compared with prior imaging studies. 3. Pelvic implants and left lower quadrant lesion with similar size, of the area along the left iliac vasculature may be slightly larger than on the prior study. 4. Signs of extensive retroperitoneal and pelvic lymphadenectomy. 5. Hepatic steatosis. 6. 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     06/08/2019 Cancer Staging   Staging form: Ovary, AJCC 7th Edition - Clinical: Stage IIIC (rT2, N1, M0) - Signed by Artis Delay, MD on 06/08/2019   06/10/2019 Imaging   1. 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 These findings are slightly worsened in comparison to a remote prior CT examination dated 10/04/2014.   2.  No evidence of metastatic disease in the chest.   3. Status post hysterectomy, pelvic and retroperitoneal lymph node dissection, and ventral hernia mesh repair.   4.  Hepatic steatosis.   5.  Aortic Atherosclerosis (ICD10-I70.0).   06/24/2019 - 09/02/2019 Chemotherapy   The patient had carboplatin for chemotherapy treatment.     09/02/2019 Tumor Marker   Patient's tumor was tested for the following markers: Inhibin B Results of the tumor marker test revealed 94   09/23/2019 Imaging   1. Interval progression  of the soft tissue lesions in the anterior left pelvis, likely peritoneal implants, compatible with disease progression. Remaining sites of apparent disease along the liver capsule and posterior spleen are stable 2. Stable 2 cm left adrenal adenoma. 3. Hepatic steatosis. 4. Right-side predominant colonic diverticulosis without diverticulitis. 5. Aortic Atherosclerosis (ICD10-I70.0).   12/23/2019 Imaging   1. Slight interval increase in size of a mixed solid and cystic nodule in the left hemipelvis measuring 3.0 x 2.9 cm, previously 2.7 x 2.1 cm. 2. Interval decrease in size of a nodule in the left paracolic gutter measuring 1.0 x 0.8 cm, previously 2.1 x 1.6 cm. 3. Stable peritoneal nodules in the hepatorenal recess and at the inferior tip of the spleen. 4. Unchanged nodule or lymph node overlying the left external iliac artery. 5. Enlargement of dominant left pelvic nodule is concerning for disease progression despite interval decrease in size of a nodule in the left paracolic gutter and stability of other nodules. 6. No  evidence of metastatic disease in the chest. 7. Stable, benign left adrenal adenoma. 8. Ventral hernia mesh repair with a small component of recurrent hernia inferiorly, containing a single nonobstructed loop of small bowel. 9. Hepatic steatosis. 10. Aortic Atherosclerosis (ICD10-I70.0).   12/23/2019 Tumor Marker   Patient's tumor was tested for the following markers: Inhibin B Results of the tumor marker test revealed 94.9   01/25/2020 Tumor Marker   Patient's tumor was tested for the following markers: Inhibin B Results of the tumor marker test revealed 116.7   02/20/2020 Genetic Testing   Negative genetic testing: no pathogenic variants detected in [facility] Multi-Cancer Panel.  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  The report date is February 20, 2020.   The Multi-Cancer Panel offered by [facility] includes sequencing and/or deletion  duplication testing of the following 85 genes: AIP, ALK, APC, ATM, AXIN2,BAP1,  BARD1, BLM, BMPR1A, BRCA1, BRCA2, BRIP1, CASR, CDC73, CDH1, CDK4, CDKN1B, CDKN1C, CDKN2A (p14ARF), CDKN2A (p16INK4a), CEBPA, CHEK2, CTNNA1, DICER1, DIS3L2, EGFR (c.2369C>T, p.Thr790Met variant only), EPCAM (Deletion/duplication testing only), FH, FLCN, GATA2, GPC3, GREM1 (Promoter region deletion/duplication testing only), HOXB13 (c.251G>A, p.Gly84Glu), HRAS, KIT, MAX, MEN1, MET, MITF (c.952G>A, p.Glu318Lys variant only), MLH1, MSH2, MSH3, MSH6, MUTYH, NBN, NF1, NF2, NTHL1, PALB2, PDGFRA, PHOX2B, PMS2, POLD1, POLE, POT1, PRKAR1A, PTCH1, PTEN, RAD50, RAD51C, RAD51D, RB1, RECQL4, RET, RNF43, RUNX1, SDHAF2, SDHA (sequence changes only), SDHB, SDHC, SDHD, SMAD4, SMARCA4, SMARCB1, SMARCE1, STK11, SUFU, TERC, TERT, TMEM127, TP53, TSC1, TSC2, VHL, WRN and WT1.    03/02/2020 Tumor Marker   Patient's tumor was tested for the following markers: Inhibin B Results of the tumor marker test revealed 80.8   04/17/2020 Imaging   1. Overall, exam is stable. Multiple peritoneal nodules are again seen. The index nodule in the left lower quadrant is mildly increased in size in the interval. The lesion within the anterior left pelvis has decreased in size in the interval. There has also been decrease in size of cystic lesion within the a hepatorenal recess. The remaining peritoneal lesions are unchanged. No new lesions identified. 2. Stable left adrenal nodule. 3.  Aortic Atherosclerosis (ICD10-I70.0).     08/21/2020 Imaging   Bone density is normal AP spine T score 0.8 Femoral neck on the left, T score -0.2 Femoral neck on the right ,T score -0.4   10/17/2020 Imaging   1. Interval progression of peritoneal nodules along the left pelvic sidewall, concerning for progressive metastatic disease. 2. Small capsular lesion medial right liver and the apparent cystic lesion superior to the right kidney are similar to prior. 3. Tiny soft tissue  nodule anterior aspect of the lateral left pelvis described as decreasing on the prior study has decreased further on today's exam. 4. Stable left adrenal nodule. This cannot be definitively characterized. 5. Tiny nonobstructing stone lower pole right kidney. 6. Aortic Atherosclerosis (ICD10-I70.0).   10/26/2020 Procedure   Successful placement of a right internal jugular approach power injectable Port-A-Cath. The catheter is ready for immediate use.       10/31/2020 - 10/04/2021 Chemotherapy   Patient is on Treatment Plan : OVARIAN Paclitaxel Z6,1,09,60 q28d     01/25/2021 Imaging   1. Signs of peritoneal disease with scattered peritoneal nodules. The index lesions are either stable or decreased in size in the interval as detailed above. No new sites of disease. 2. Ventral pelvic wall hernia contains a nonobstructed loop of small bowel. 3. Aortic Atherosclerosis (ICD10-I70.0).     06/14/2021 Imaging   1. Multiple peritoneal  metastatic nodules are slightly diminished in size. 2. A left external iliac lymph node or nodule is however stable to slightly enlarged. 3. Overall findings are most consistent with continued treatment response of metastatic disease. 4. No evidence of metastatic disease within the chest. 5. Hepatic steatosis.     06/14/2021 Imaging   Right: Findings consistent with 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   Left: No evidence of thrombosis in the subclavian.   06/20/2021 Procedure   Successful right IJ vein Port-A-Cath explant.     10/18/2021 Imaging   1. Similar to mild interval increase in size of left external iliac lymph node. 2. Multiple peritoneal metastatic nodules are grossly unchanged in size when compared to prior exam   02/14/2022 Imaging   1. Enlarging lymph node along the LEFT iliac chain, compatible with mild worsening of disease in this area. Stable stigmata of disease in the upper  abdomen. 2. Post LEFT retroperitoneal and pelvic lymphadenectomy. 3. Stable LEFT adrenal adenoma. 4. Stable 4 mm pulmonary nodule in the LEFT lung base. 5. Aortic atherosclerosis. 6. Mild hepatic steatosis. 7. Post abdominal wall reconstruction with hernia below the mesh as on previous imaging.     05/24/2022 Imaging   1. Exam is generally stable with no substantial interval change and no clear trend towards improving or progressive disease. There is one tiny soft tissue nodule along the medial capsule of the posterior right liver measuring 8 x 5 mm, more conspicuous than on the prior study. Close attention on follow-up recommended. 2. A second capsular implant along the medial liver capsule more anteriorly measures smaller on today's exam. Additional capsular implants along the medial liver and inferior spleen are stable. The left iliac lymph node identified as enlarging on the previous study is not substantially changed in the interval. 3. Stable 2 cm left adrenal nodule, previously characterized as adenoma. 4. Stable 4 mm left lower lobe pulmonary nodule. 5. Similar appearance of ventral hernia repair with recurrence. 6.  Aortic Atherosclerosis (ICD10-I70.0).   08/28/2022 Imaging   Bone density scan is normal   11/21/2022 Imaging   CT ABDOMEN PELVIS W CONTRAST  Result Date: 11/20/2022 CLINICAL DATA:  Ovarian cancer, assess treatment response * Tracking Code: BO * EXAM: CT ABDOMEN AND PELVIS WITH CONTRAST TECHNIQUE: Multidetector CT imaging of the abdomen and pelvis was performed using the standard protocol following bolus administration of intravenous contrast. RADIATION DOSE REDUCTION: This exam was performed according to the departmental dose-optimization program which includes automated exposure control, adjustment of the mA and/or kV according to patient size and/or use of iterative reconstruction technique. CONTRAST:  OMNIPAQUE IOHEXOL 300 MG/ML SOLN additional oral enteric contrast  COMPARISON:  05/23/2022 FINDINGS: Lower chest: No acute abnormality. Hepatobiliary: No solid liver abnormality is seen. Hepatic steatosis. No gallstones, gallbladder wall thickening, or biliary dilatation. Pancreas: Unremarkable. No pancreatic ductal dilatation or surrounding inflammatory changes. Spleen: Normal in size. Unchanged partially exophytic cyst arising from the inferior portion of the spleen (series 2, image 21) Adrenals/Urinary Tract: Unchanged, definitively benign macroscopic fat containing left adrenal adenoma, for which no further follow-up or characterization is required (series 2, image 21). Simple, benign bilateral renal cortical cysts, for which no further follow-up or characterization is required. Kidneys are otherwise normal, without renal calculi, solid lesion, or hydronephrosis. Bladder is unremarkable. Stomach/Bowel: Stomach is within normal limits. Appendix appears normal. No evidence of bowel wall thickening, distention, or inflammatory changes. Colonic diverticulosis. Moderate burden of stool  throughout the colon and rectum. Status post omentectomy. Vascular/Lymphatic: Aortic atherosclerosis. Slight interval enlargement of a left iliac lymph node, measuring 1.9 x 1.7 cm, previously 1.7 x 1.5 cm (series 2, image 60). Reproductive: Status post hysterectomy and oophorectomy. Other: Status post midline ventral hernia mesh repair. No ascites. Unchanged cyst in the vicinity of the posterior medial right lobe of the liver measuring 3.1 x 2.6 cm (series 2, image 23). Similar appearance of minimal, somewhat nodular soft tissue thickening in the right hepatorenal recess in this vicinity (series 2, image 23). Slight interval enlargement of a soft tissue nodule in the inferior left paracolic gutter measuring 1.0 cm, previously 0.7 cm (series 2, image 63). Musculoskeletal: No acute or significant osseous findings. IMPRESSION: 1. Slight interval enlargement of a left iliac lymph node, concerning for  nodal metastatic disease. 2. Slight interval enlargement of a soft tissue nodule in the inferior left paracolic gutter, concerning for minimally worsened peritoneal metastatic disease. Similar appearance of cystic peritoneal implants and minimal, somewhat nodular soft tissue thickening in the right hepatorenal recess. 3. Status post hysterectomy and oophorectomy. 4. Hepatic steatosis. 5. Colonic diverticulosis. Aortic Atherosclerosis (ICD10-I70.0). Electronically Signed   By: Jearld Lesch M.D.   On: 11/20/2022 16:36        PHYSICAL EXAMINATION: ECOG PERFORMANCE STATUS: 0 - Asymptomatic  Vitals:   11/21/22 0828  BP: 126/70  Pulse: (!) 55  Resp: 18  SpO2: 99%   Filed Weights   11/21/22 0828  Weight: 188 lb 12.8 oz (85.6 kg)    GENERAL:alert, no distress and comfortable NEURO: alert & oriented x 3 with fluent speech, no focal motor/sensory deficits  LABORATORY DATA:  I have reviewed the data as listed    Component Value Date/Time   NA 138 11/21/2022 0807   NA 140 07/09/2021 0813   NA 141 09/24/2013 1131   K 4.0 11/21/2022 0807   K 4.5 09/24/2013 1131   CL 101 11/21/2022 0807   CO2 29 11/21/2022 0807   CO2 24 09/24/2013 1131   GLUCOSE 84 11/21/2022 0807   GLUCOSE 90 09/24/2013 1131   BUN 23 11/21/2022 0807   BUN 20 07/09/2021 0813   BUN 21.2 09/24/2013 1131   CREATININE 1.18 (H) 11/21/2022 0807   CREATININE 1.19 (H) 06/20/2021 1513   CREATININE 1.1 09/24/2013 1131   CALCIUM 9.4 11/21/2022 0807   CALCIUM 9.5 09/24/2013 1131   PROT 6.9 11/21/2022 0807   PROT 6.7 11/02/2021 0000   PROT 6.8 09/24/2013 1131   ALBUMIN 4.2 11/21/2022 0807   ALBUMIN 4.4 11/02/2021 0000   ALBUMIN 3.9 09/24/2013 1131   AST 18 11/21/2022 0807   AST 21 06/20/2021 1513   AST 23 09/24/2013 1131   ALT 15 11/21/2022 0807   ALT 19 06/20/2021 1513   ALT 27 09/24/2013 1131   ALKPHOS 83 11/21/2022 0807   ALKPHOS 81 09/24/2013 1131   BILITOT 0.6 11/21/2022 0807   BILITOT 0.4 11/02/2021 0000    BILITOT 0.4 06/20/2021 1513   BILITOT 0.53 09/24/2013 1131   GFRNONAA 51 (L) 11/21/2022 0807   GFRNONAA 51 (L) 06/20/2021 1513   GFRAA 55 (L) 01/24/2020 1530   GFRAA 57 (L) 12/21/2019 1259    No results found for: "SPEP", "UPEP"  Lab Results  Component Value Date   WBC 5.7 11/21/2022   NEUTROABS 3.5 11/21/2022   HGB 13.5 11/21/2022   HCT 40.5 11/21/2022   MCV 86.7 11/21/2022   PLT 304 11/21/2022      Chemistry  Component Value Date/Time   NA 138 11/21/2022 0807   NA 140 07/09/2021 0813   NA 141 09/24/2013 1131   K 4.0 11/21/2022 0807   K 4.5 09/24/2013 1131   CL 101 11/21/2022 0807   CO2 29 11/21/2022 0807   CO2 24 09/24/2013 1131   BUN 23 11/21/2022 0807   BUN 20 07/09/2021 0813   BUN 21.2 09/24/2013 1131   CREATININE 1.18 (H) 11/21/2022 0807   CREATININE 1.19 (H) 06/20/2021 1513   CREATININE 1.1 09/24/2013 1131      Component Value Date/Time   CALCIUM 9.4 11/21/2022 0807   CALCIUM 9.5 09/24/2013 1131   ALKPHOS 83 11/21/2022 0807   ALKPHOS 81 09/24/2013 1131   AST 18 11/21/2022 0807   AST 21 06/20/2021 1513   AST 23 09/24/2013 1131   ALT 15 11/21/2022 0807   ALT 19 06/20/2021 1513   ALT 27 09/24/2013 1131   BILITOT 0.6 11/21/2022 0807   BILITOT 0.4 11/02/2021 0000   BILITOT 0.4 06/20/2021 1513   BILITOT 0.53 09/24/2013 1131       RADIOGRAPHIC STUDIES: We discussed and reviewed multiple imaging studies  I have personally reviewed the radiological images as listed and agreed with the findings in the report. CT ABDOMEN PELVIS W CONTRAST  Result Date: 11/20/2022 CLINICAL DATA:  Ovarian cancer, assess treatment response * Tracking Code: BO * EXAM: CT ABDOMEN AND PELVIS WITH CONTRAST TECHNIQUE: Multidetector CT imaging of the abdomen and pelvis was performed using the standard protocol following bolus administration of intravenous contrast. RADIATION DOSE REDUCTION: This exam was performed according to the departmental dose-optimization program which  includes automated exposure control, adjustment of the mA and/or kV according to patient size and/or use of iterative reconstruction technique. CONTRAST:  OMNIPAQUE IOHEXOL 300 MG/ML SOLN additional oral enteric contrast COMPARISON:  05/23/2022 FINDINGS: Lower chest: No acute abnormality. Hepatobiliary: No solid liver abnormality is seen. Hepatic steatosis. No gallstones, gallbladder wall thickening, or biliary dilatation. Pancreas: Unremarkable. No pancreatic ductal dilatation or surrounding inflammatory changes. Spleen: Normal in size. Unchanged partially exophytic cyst arising from the inferior portion of the spleen (series 2, image 21) Adrenals/Urinary Tract: Unchanged, definitively benign macroscopic fat containing left adrenal adenoma, for which no further follow-up or characterization is required (series 2, image 21). Simple, benign bilateral renal cortical cysts, for which no further follow-up or characterization is required. Kidneys are otherwise normal, without renal calculi, solid lesion, or hydronephrosis. Bladder is unremarkable. Stomach/Bowel: Stomach is within normal limits. Appendix appears normal. No evidence of bowel wall thickening, distention, or inflammatory changes. Colonic diverticulosis. Moderate burden of stool throughout the colon and rectum. Status post omentectomy. Vascular/Lymphatic: Aortic atherosclerosis. Slight interval enlargement of a left iliac lymph node, measuring 1.9 x 1.7 cm, previously 1.7 x 1.5 cm (series 2, image 60). Reproductive: Status post hysterectomy and oophorectomy. Other: Status post midline ventral hernia mesh repair. No ascites. Unchanged cyst in the vicinity of the posterior medial right lobe of the liver measuring 3.1 x 2.6 cm (series 2, image 23). Similar appearance of minimal, somewhat nodular soft tissue thickening in the right hepatorenal recess in this vicinity (series 2, image 23). Slight interval enlargement of a soft tissue nodule in the inferior  left paracolic gutter measuring 1.0 cm, previously 0.7 cm (series 2, image 63). Musculoskeletal: No acute or significant osseous findings. IMPRESSION: 1. Slight interval enlargement of a left iliac lymph node, concerning for nodal metastatic disease. 2. 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 Similar appearance of cystic peritoneal implants and minimal, somewhat nodular soft tissue thickening in the right hepatorenal recess. 3. Status post hysterectomy and oophorectomy. 4. Hepatic steatosis. 5. Colonic diverticulosis. Aortic Atherosclerosis (ICD10-I70.0). Electronically Signed   By: Jearld Lesch M.D.   On: 11/20/2022 16:36

## 2022-11-21 NOTE — Assessment & Plan Note (Signed)
Her renal function is stable/slightly fluctuated Observe closely Discussed importance of oral fluid hydration

## 2022-11-21 NOTE — Assessment & Plan Note (Signed)
I have reviewed multiple CT imaging with the patient Radiologist commented on slight progression but overall the changes are mild and she is not symptomatic The plan will be to continue on current treatment with combination of Lupron monthly and aromatase inhibitor daily I plan to repeat imaging study again end of the year

## 2022-11-26 ENCOUNTER — Inpatient Hospital Stay: Payer: 59

## 2022-11-27 ENCOUNTER — Inpatient Hospital Stay: Payer: 59

## 2022-11-27 VITALS — BP 136/62 | HR 67 | Temp 98.0°F | Resp 18

## 2022-11-27 DIAGNOSIS — C569 Malignant neoplasm of unspecified ovary: Secondary | ICD-10-CM | POA: Diagnosis not present

## 2022-11-27 DIAGNOSIS — Z79811 Long term (current) use of aromatase inhibitors: Secondary | ICD-10-CM | POA: Diagnosis not present

## 2022-11-27 DIAGNOSIS — C786 Secondary malignant neoplasm of retroperitoneum and peritoneum: Secondary | ICD-10-CM | POA: Diagnosis not present

## 2022-11-27 DIAGNOSIS — N183 Chronic kidney disease, stage 3 unspecified: Secondary | ICD-10-CM | POA: Diagnosis not present

## 2022-11-27 MED ORDER — LEUPROLIDE ACETATE 3.75 MG IM KIT
3.7500 mg | PACK | Freq: Once | INTRAMUSCULAR | Status: AC
  Administered 2022-11-27: 3.75 mg via INTRAMUSCULAR
  Filled 2022-11-27: qty 3.75

## 2022-12-03 DIAGNOSIS — M7130 Other bursal cyst, unspecified site: Secondary | ICD-10-CM | POA: Diagnosis not present

## 2022-12-03 DIAGNOSIS — L821 Other seborrheic keratosis: Secondary | ICD-10-CM | POA: Diagnosis not present

## 2022-12-03 DIAGNOSIS — L309 Dermatitis, unspecified: Secondary | ICD-10-CM | POA: Diagnosis not present

## 2022-12-04 ENCOUNTER — Other Ambulatory Visit (HOSPITAL_COMMUNITY): Payer: Self-pay

## 2022-12-04 ENCOUNTER — Other Ambulatory Visit: Payer: Self-pay

## 2022-12-04 MED ORDER — TRIAMCINOLONE ACETONIDE 0.1 % EX CREA
1.0000 | TOPICAL_CREAM | Freq: Two times a day (BID) | CUTANEOUS | 0 refills | Status: AC
  Filled 2022-12-04: qty 454, 90d supply, fill #0

## 2022-12-18 ENCOUNTER — Other Ambulatory Visit (HOSPITAL_COMMUNITY): Payer: Self-pay

## 2022-12-18 MED ORDER — TRETINOIN 0.025 % EX CREA
1.0000 "application " | TOPICAL_CREAM | Freq: Every evening | CUTANEOUS | 2 refills | Status: AC
  Filled 2022-12-18: qty 20, 30d supply, fill #0
  Filled 2023-05-12: qty 20, 30d supply, fill #1

## 2022-12-19 ENCOUNTER — Other Ambulatory Visit: Payer: Self-pay

## 2022-12-26 ENCOUNTER — Encounter (INDEPENDENT_AMBULATORY_CARE_PROVIDER_SITE_OTHER): Payer: Self-pay | Admitting: Physician Assistant

## 2022-12-26 ENCOUNTER — Other Ambulatory Visit (HOSPITAL_COMMUNITY): Payer: Self-pay

## 2022-12-26 ENCOUNTER — Ambulatory Visit (INDEPENDENT_AMBULATORY_CARE_PROVIDER_SITE_OTHER): Payer: 59 | Admitting: Physician Assistant

## 2022-12-26 VITALS — BP 130/80 | HR 50 | Temp 98.0°F | Ht 66.0 in | Wt 187.0 lb

## 2022-12-26 DIAGNOSIS — E669 Obesity, unspecified: Secondary | ICD-10-CM

## 2022-12-26 DIAGNOSIS — F32A Depression, unspecified: Secondary | ICD-10-CM

## 2022-12-26 DIAGNOSIS — E559 Vitamin D deficiency, unspecified: Secondary | ICD-10-CM | POA: Diagnosis not present

## 2022-12-26 DIAGNOSIS — Z6833 Body mass index (BMI) 33.0-33.9, adult: Secondary | ICD-10-CM | POA: Diagnosis not present

## 2022-12-26 DIAGNOSIS — F3289 Other specified depressive episodes: Secondary | ICD-10-CM

## 2022-12-26 DIAGNOSIS — R7303 Prediabetes: Secondary | ICD-10-CM

## 2022-12-26 MED ORDER — METFORMIN HCL 500 MG PO TABS
ORAL_TABLET | ORAL | 1 refills | Status: DC
Start: 2022-12-26 — End: 2023-06-05
  Filled 2022-12-26: qty 270, 90d supply, fill #0
  Filled 2023-05-12: qty 270, 90d supply, fill #1

## 2022-12-26 MED ORDER — BUPROPION HCL ER (SR) 200 MG PO TB12
200.0000 mg | ORAL_TABLET | Freq: Two times a day (BID) | ORAL | 1 refills | Status: DC
Start: 2022-12-26 — End: 2023-06-05
  Filled 2022-12-26: qty 180, 90d supply, fill #0
  Filled 2023-05-12: qty 180, 90d supply, fill #1

## 2022-12-26 MED ORDER — VITAMIN D (ERGOCALCIFEROL) 1.25 MG (50000 UNIT) PO CAPS
50000.0000 [IU] | ORAL_CAPSULE | ORAL | 1 refills | Status: DC
Start: 2022-12-26 — End: 2023-03-19
  Filled 2022-12-26: qty 9, 90d supply, fill #0

## 2022-12-26 NOTE — Progress Notes (Signed)
.smr  Office: [phone]  /  Fax: [phone]  WEIGHT SUMMARY AND BIOMETRICS  Vitals Temp: 98 F (36.7 C) BP: 130/80 Pulse Rate: (!) 50 SpO2: 99 %   Anthropometric Measurements Height: 5\' 6"  (1.676 m) Weight: 187 lb (84.8 kg) BMI (Calculated): 30.2 Weight at Last Visit: 187 lb Weight Lost Since Last Visit: 0 Weight Gained Since Last Visit: 0 Starting Weight: 209 lb Total Weight Loss (lbs): 22 lb (9.979 kg) Peak Weight: 215 lb   Body Composition  Body Fat %: 40.9 % Fat Mass (lbs): 76.8 lbs Muscle Mass (lbs): 105.2 lbs Total Body Water (lbs): 71.8 lbs Visceral Fat Rating : 11   Other Clinical Data Fasting: no Labs: no Starting Date: 04/29/18     HPI  Chief Complaint: OBESITY  [name] is here to discuss her progress with her obesity treatment plan. She is on the the Category 2 Plan and practicing portion control and making smarter food choices, such as increasing vegetables and decreasing simple carbohydrates and states she is following her eating plan approximately 75 % of the time. She states she is exercising 0 minutes 0 times per week.  Discussed the use of AI scribe software for clinical note transcription with the patient, who gave verbal consent to proceed.  History of Present Illness  /  Interval History:  Since last office visit she maintained her weight.  Bio impedence scale reviewed with the patient:  She has maintained her muscle mass Maintained adipose mass     She reports difficulty incorporating exercise into her routine due to increased work hours and fatigue. Despite these challenges, she has been able to maintain her muscle mass and her weight has remained stable. She acknowledges the need for more exercise, particularly strength training, which she plans to incorporate into her routine at home.  Her diet consists of a protein shake or yogurt in the morning, lunch from home, and protein with a vegetable for dinner. She admits to struggling  with cravings at times and not consistently getting adequate protein. We discussed options for additional protein intake such as Pillars drinkable yogurts.    Pharmacotherapy: metformin for prediabetes. No GI side effects.  Wellbutrin for emotional eating/cravings.    Topamax for cravings/emotional eating.  TREATMENT PLAN FOR OBESITY:  Maintaining muscle mass and overall weight despite decreased exercise due to increased work hours. Discussed the importance of incorporating strength training into daily routine and the potential benefits of ankle weights for passive resistance. -Encouraged to incorporate strength training exercises at home, even if only for 10-15 minutes daily. -Consider use of ankle weights during the day for passive resistance. Recommended Dietary Goals  Azia is currently in the action stage of change. As such, her goal is to continue weight management plan. She has agreed to the Category 2 Plan and practicing portion control and making smarter food choices, such as increasing vegetables and decreasing simple carbohydrates.  Behavioral Intervention  We discussed the following Behavioral Modification Strategies today: increasing lean protein intake, decreasing simple carbohydrates , increasing vegetables, increasing lower glycemic fruits, increasing fiber rich foods, avoiding skipping meals, increasing water intake, emotional eating strategies and understanding the difference between hunger signals and cravings, work on managing stress, creating time for self-care and relaxation measures, continue to practice mindfulness when eating, and planning for success.  Additional resources provided today: NA  Recommended Physical Activity Goals  Fionna has been advised to work up to 150 minutes of moderate intensity aerobic activity a week and strengthening exercises 2-3  times per week for cardiovascular health, weight loss maintenance and preservation of muscle mass.   She has  agreed to Continue current level of physical activity  and Start strengthening exercises with a goal of 2-3 sessions a week    Pharmacotherapy We discussed various medication options to help Rachele with her weight loss efforts and we both agreed to continue metformin for prediabetes, Wellbutrin/topiramate for emotional eating/cravings .    Return in about 3 months (around 03/27/2023) for Fasting Lab.Marland Kitchen She was informed of the importance of frequent follow up visits to maximize her success with intensive lifestyle modifications for her multiple health conditions.  PHYSICAL EXAM:  Blood pressure 130/80, pulse (!) 50, temperature 98 F (36.7 C), height 5\' 6"  (1.676 m), weight 187 lb (84.8 kg), SpO2 99%. Body mass index is 30.18 kg/m.  General: She is overweight, cooperative, alert, well developed, and in no acute distress. PSYCH: Has normal mood, affect and thought process.   Cardiovascular: HR 50's BP 130/80 Lungs: Normal breathing effort, no conversational dyspnea.  DIAGNOSTIC DATA REVIEWED:  BMET    Component Value Date/Time   NA 138 11/21/2022 0807   NA 140 07/09/2021 0813   NA 141 09/24/2013 1131   K 4.0 11/21/2022 0807   K 4.5 09/24/2013 1131   CL 101 11/21/2022 0807   CO2 29 11/21/2022 0807   CO2 24 09/24/2013 1131   GLUCOSE 84 11/21/2022 0807   GLUCOSE 90 09/24/2013 1131   BUN 23 11/21/2022 0807   BUN 20 07/09/2021 0813   BUN 21.2 09/24/2013 1131   CREATININE 1.18 (H) 11/21/2022 0807   CREATININE 1.19 (H) 06/20/2021 1513   CREATININE 1.1 09/24/2013 1131   CALCIUM 9.4 11/21/2022 0807   CALCIUM 9.5 09/24/2013 1131   GFRNONAA 51 (L) 11/21/2022 0807   GFRNONAA 51 (L) 06/20/2021 1513   GFRAA 55 (L) 01/24/2020 1530   GFRAA 57 (L) 12/21/2019 1259   Lab Results  Component Value Date   HGBA1C 5.7 (H) 10/03/2022   HGBA1C 5.5 04/29/2018   Lab Results  Component Value Date   INSULIN 17.4 02/12/2022   INSULIN 25.8 (H) 04/29/2018   Lab Results  Component Value Date    TSH 0.836 02/12/2022   CBC    Component Value Date/Time   WBC 5.7 11/21/2022 0807   RBC 4.67 11/21/2022 0807   HGB 13.5 11/21/2022 0807   HGB 12.9 08/09/2021 1107   HGB 13.4 07/09/2021 0813   HCT 40.5 11/21/2022 0807   HCT 38.9 07/09/2021 0813   PLT 304 11/21/2022 0807   PLT 315 08/09/2021 1107   PLT 349 07/09/2021 0813   MCV 86.7 11/21/2022 0807   MCV 91 07/09/2021 0813   MCH 28.9 11/21/2022 0807   MCHC 33.3 11/21/2022 0807   RDW 12.8 11/21/2022 0807   RDW 13.5 07/09/2021 0813   Iron Studies No results found for: "IRON", "TIBC", "FERRITIN", "IRONPCTSAT" Lipid Panel     Component Value Date/Time   CHOL 255 (H) 07/09/2021 0813   TRIG 126 07/09/2021 0813   HDL 50 07/09/2021 0813   LDLCALC 182 (H) 07/09/2021 0813   Hepatic Function Panel     Component Value Date/Time   PROT 6.9 11/21/2022 0807   PROT 6.7 11/02/2021 0000   PROT 6.8 09/24/2013 1131   ALBUMIN 4.2 11/21/2022 0807   ALBUMIN 4.4 11/02/2021 0000   ALBUMIN 3.9 09/24/2013 1131   AST 18 11/21/2022 0807   AST 21 06/20/2021 1513   AST 23 09/24/2013 1131   ALT  15 11/21/2022 0807   ALT 19 06/20/2021 1513   ALT 27 09/24/2013 1131   ALKPHOS 83 11/21/2022 0807   ALKPHOS 81 09/24/2013 1131   BILITOT 0.6 11/21/2022 0807   BILITOT 0.4 11/02/2021 0000   BILITOT 0.4 06/20/2021 1513   BILITOT 0.53 09/24/2013 1131   BILIDIR 0.12 11/02/2021 0000      Component Value Date/Time   TSH 0.836 02/12/2022 0844   Nutritional Lab Results  Component Value Date   VD25OH 75.6 10/03/2022   VD25OH 81.7 02/12/2022   VD25OH 38.6 07/09/2021    ASSOCIATED CONDITIONS ADDRESSED TODAY  ASSESSMENT AND PLAN  Problem List Items Addressed This Visit     Vitamin D deficiency   Relevant Medications   Vitamin D, Ergocalciferol, (DRISDOL) 1.25 MG (50000 UNIT) CAPS capsule   Prediabetes - Primary   Relevant Medications   metFORMIN (GLUCOPHAGE) 500 MG tablet   Depression   Relevant Medications   buPROPion (WELLBUTRIN SR) 200 MG  12 hr tablet   Generalized obesity- Start BMI 33.73   Relevant Medications   metFORMIN (GLUCOPHAGE) 500 MG tablet  Prediabetes Last A1c was 5.7 slightly up and not at goal.   Medication(s):  metformin 500 mg in am and 1000 mg evenings. No GI or other side effects with metformin.  Polyphagia:No Lab Results  Component Value Date   HGBA1C 5.7 (H) 10/03/2022   HGBA1C 5.5 02/12/2022   HGBA1C 5.4 07/09/2021   HGBA1C 5.2 11/30/2019   HGBA1C 5.3 05/31/2019   Lab Results  Component Value Date   INSULIN 17.4 02/12/2022   INSULIN 17.1 07/09/2021   INSULIN 23.8 11/30/2019   INSULIN 16.5 05/31/2019   INSULIN 17.6 11/19/2018    Plan: Continue and refill metformin 500 mg in am and 1000 mg evenings.  Continue working on nutrition plan to decrease simple carbohydrates, increase lean proteins and exercise to promote weight loss, improve glycemic control and prevent progression to Type 2 diabetes.  -Plan to check A1c, insulin level, and other labs at next visit in 3 months.   Vitamin D Deficiency Vitamin D is at goal of 50.  Most recent vitamin D level was 75.6. She is on Ergocalciferol 50000 units every 10 days. . Lab Results  Component Value Date   VD25OH 75.6 10/03/2022   VD25OH 81.7 02/12/2022   VD25OH 38.6 07/09/2021    Plan: Continue and refill Ergocalciferol 50K units every 10 days. Low vitamin D levels can be associated with adiposity and may result in leptin resistance and weight gain. Also associated with fatigue. Currently on vitamin D supplementation without any adverse effects.  Plan to recheck vitamin D level at follow up in 3 months.   Eating disorder/emotional eating Devlyn has had issues with stress/emotional eating. Currently this is moderately controlled. Overall mood is stable. Medication(s): Bupropion SR 200 mg twice daily and Topiramate 25-50 mg at bedtime for cravings.   Plan: Continue and refill Bupropion SR 200 mg twice daily and continue/try topamax 25-50 mg  at bedtime for cravings. Continue to work on emotional eating strategies.   Medication Management Wellbutrin and Vitamin D tolerated well. Topamax used inconsistently for cravings. -Continue Wellbutrin, Vitamin D, and Metformin. -Encouraged to experiment with Topamax dosing, potentially increasing to 50mg , and monitor for effects on cravings.  Follow-up in 3 months with fasting labs.  ATTESTASTION STATEMENTS:  Reviewed by clinician on day of visit: allergies, medications, problem list, medical history, surgical history, family history, social history, and previous encounter notes.   I have personally spent  35 minutes total time today in preparation, patient care, nutritional counseling and documentation for this visit, including the following: review of clinical lab tests; review of medical tests/procedures/services.      Keon Pender, PA-C

## 2022-12-27 NOTE — Progress Notes (Signed)
Cardiology Office Note:  .   Date:  01/07/2023  ID:  [name], DOB 1956/02/28, MRN [id number] PCP: Kirby Funk, MD (Inactive)  [facility] Providers Cardiologist:  Orbie Pyo, MD    History of Present Illness: .   [name] is a 67 y.o. female with history of chest pain, CTA 2023 calcium score 0, ovarian CA, CKD3, Aortic atherosclerosis and HLD.  Patient comes in for f/u. Still has occasional chest tightness when she walks up hill-occurs about twice a week. Goes away when she slows down. Never happens when she swims for 1 hour. Doesn't happen when she takes cardio classes. She tried imdur and it may have helped some but not significant so she stopped it. She had some blood clots in SVC when she had her porta cath   ROS:    Studies Reviewed: Marland Kitchen    EKG Interpretation Date/Time:  Tuesday January 07 2023 07:42:31 EDT Ventricular Rate:  55 PR Interval:  140 QRS Duration:  84 QT Interval:  446 QTC Calculation: 426 R Axis:   60  Text Interpretation: Sinus bradycardia When compared with ECG of 04-Oct-2016 08:47, Premature atrial complexes are no longer Present Confirmed by Jacolyn Mosley [phone]) on 01/07/2023 8:14:15 AM    Prior CV Studies:   CTA 2023: 1. Calcium score 0 2.  Ascending thoracic aorta 3.7 cm 3.  Normal right dominant coronary arteries   TTE 2023:  1. Left ventricular ejection fraction, by estimation, is 60 to 65%. The  left ventricle has normal function. The left ventricle has no regional  wall motion abnormalities. Left ventricular diastolic parameters are  consistent with Grade I diastolic  dysfunction (impaired relaxation).   2. Right ventricular systolic function is normal. The right ventricular  size is normal. There is normal pulmonary artery systolic pressure. The  estimated right ventricular systolic pressure is 23.8 mmHg.   3. The mitral valve is normal in structure. Mild mitral valve  regurgitation. No evidence of mitral stenosis.    4. The aortic valve is normal in structure. Aortic valve regurgitation is  not visualized. No aortic stenosis is present.   5. The inferior vena cava is normal in size with greater than 50%  respiratory variability, suggesting right atrial pressure of 3 mmHg.   Risk Assessment/Calculations:             Physical Exam:   VS:  BP 128/74   Pulse (!) 55   Ht 5\' 6"  (1.676 m)   Wt 186 lb 9.6 oz (84.6 kg)   SpO2 96%   BMI 30.12 kg/m    Wt Readings from Last 3 Encounters:  01/07/23 186 lb 9.6 oz (84.6 kg)  12/26/22 187 lb (84.8 kg)  11/21/22 188 lb 12.8 oz (85.6 kg)    GEN: Well nourished, well developed in no acute distress NECK: No JVD; No carotid bruits CARDIAC: RRR, no murmurs, rubs, gallops RESPIRATORY:  Clear to auscultation without rales, wheezing or rhonchi  ABDOMEN: Soft, non-tender, non-distended EXTREMITIES:  No edema; No deformity   ASSESSMENT AND PLAN: .     History of Chest pain: CTA was reassuring. She did a  trial of Imdur 30 mg and it helped some. She'll try again. She only has it walking up hill, not doing cardio or swimming. Call if it progresses. Could consider exercise myoview.  Follow-up in 12 months or earlier if needed.   Malignant ovarian cancer:she is being followed by Dr. Bertis Ruddy   Stage III chronic  kidney disease: Crt 1.18 11/21/22  Aortic atherosclerosis: Continue aspirin and and atorvastatin.  LP(a) is low.   .   Hyperlipidemia: LDL 182 06/2021-check today  Pre diabetes A1C 5.7          Dispo: f/u in 1 yr.  Signed, Jacolyn Reedy, PA-C

## 2023-01-07 ENCOUNTER — Encounter: Payer: Self-pay | Admitting: Physician Assistant

## 2023-01-07 ENCOUNTER — Ambulatory Visit: Payer: 59 | Attending: Physician Assistant | Admitting: Physician Assistant

## 2023-01-07 VITALS — BP 128/74 | HR 55 | Ht 66.0 in | Wt 186.6 lb

## 2023-01-07 DIAGNOSIS — N183 Chronic kidney disease, stage 3 unspecified: Secondary | ICD-10-CM

## 2023-01-07 DIAGNOSIS — E785 Hyperlipidemia, unspecified: Secondary | ICD-10-CM

## 2023-01-07 DIAGNOSIS — C569 Malignant neoplasm of unspecified ovary: Secondary | ICD-10-CM | POA: Diagnosis not present

## 2023-01-07 DIAGNOSIS — R7303 Prediabetes: Secondary | ICD-10-CM

## 2023-01-07 DIAGNOSIS — I700 Atherosclerosis of aorta: Secondary | ICD-10-CM | POA: Diagnosis not present

## 2023-01-07 DIAGNOSIS — R072 Precordial pain: Secondary | ICD-10-CM

## 2023-01-07 NOTE — Patient Instructions (Signed)
Medication Instructions:  Your physician recommends that you continue on your current medications as directed. Please refer to the Current Medication list given to you today.  *If you need a refill on your cardiac medications before your next appointment, please call your pharmacy*  Lab Work: Fasting lipid-TODAY If you have labs (blood work) drawn today and your tests are completely normal, you will receive your results only by: MyChart Message (if you have MyChart) OR A paper copy in the mail If you have any lab test that is abnormal or we need to change your treatment, we will call you to review the results.  Follow-Up: At [facility], you and your health needs are our priority.  As part of our continuing mission to provide you with exceptional heart care, we have created designated Provider Care Teams.  These Care Teams include your primary Cardiologist (physician) and Advanced Practice Providers (APPs -  Physician Assistants and Nurse Practitioners) who all work together to provide you with the care you need, when you need it.  Your next appointment:   1 year(s)  Provider:   Orbie Pyo, MD

## 2023-01-08 ENCOUNTER — Other Ambulatory Visit: Payer: Self-pay

## 2023-01-08 ENCOUNTER — Telehealth: Payer: Self-pay | Admitting: Physician Assistant

## 2023-01-08 DIAGNOSIS — Z79899 Other long term (current) drug therapy: Secondary | ICD-10-CM

## 2023-01-08 DIAGNOSIS — E785 Hyperlipidemia, unspecified: Secondary | ICD-10-CM

## 2023-01-08 MED ORDER — EZETIMIBE 10 MG PO TABS
10.0000 mg | ORAL_TABLET | Freq: Every day | ORAL | 3 refills | Status: DC
  Filled 2023-01-08: qty 90, 90d supply, fill #0
  Filled 2023-05-12: qty 90, 90d supply, fill #1
  Filled 2023-08-28: qty 90, 90d supply, fill #2

## 2023-01-08 NOTE — Telephone Encounter (Signed)
Discussed lab results and recommendations with patient.  Zetia 10mg  daily sent to pharmacy. Patient will go to [place] office to have labs drawn around December 18th. FLP ordered, order released to [facility].  Patient verbalized understanding and expressed appreciation for call.

## 2023-01-08 NOTE — Telephone Encounter (Signed)
-----   Message from Jacolyn Reedy sent at 01/08/2023  8:16 AM EDT ----- Total cholesterol down to 179, LDL 89-down from 182!!! Would like it lower so would like to add zetia 10 mg once daily. Repeat FLP in 3 months thanks

## 2023-01-15 ENCOUNTER — Other Ambulatory Visit: Payer: Self-pay

## 2023-01-15 ENCOUNTER — Other Ambulatory Visit: Payer: Self-pay | Admitting: Internal Medicine

## 2023-01-15 MED ORDER — ATORVASTATIN CALCIUM 40 MG PO TABS
40.0000 mg | ORAL_TABLET | Freq: Every day | ORAL | 3 refills | Status: DC
  Filled 2023-01-15: qty 90, 90d supply, fill #0

## 2023-01-21 DIAGNOSIS — M1812 Unilateral primary osteoarthritis of first carpometacarpal joint, left hand: Secondary | ICD-10-CM | POA: Diagnosis not present

## 2023-01-21 DIAGNOSIS — M79642 Pain in left hand: Secondary | ICD-10-CM | POA: Diagnosis not present

## 2023-01-22 ENCOUNTER — Inpatient Hospital Stay: Payer: 59

## 2023-01-22 ENCOUNTER — Other Ambulatory Visit: Payer: Self-pay

## 2023-01-22 ENCOUNTER — Inpatient Hospital Stay: Payer: 59 | Attending: Hematology

## 2023-01-22 VITALS — BP 140/75 | HR 58 | Temp 98.3°F | Resp 18

## 2023-01-22 DIAGNOSIS — Z90722 Acquired absence of ovaries, bilateral: Secondary | ICD-10-CM | POA: Insufficient documentation

## 2023-01-22 DIAGNOSIS — R590 Localized enlarged lymph nodes: Secondary | ICD-10-CM | POA: Insufficient documentation

## 2023-01-22 DIAGNOSIS — Z79890 Hormone replacement therapy (postmenopausal): Secondary | ICD-10-CM | POA: Insufficient documentation

## 2023-01-22 DIAGNOSIS — Z90710 Acquired absence of both cervix and uterus: Secondary | ICD-10-CM | POA: Diagnosis not present

## 2023-01-22 DIAGNOSIS — Z79818 Long term (current) use of other agents affecting estrogen receptors and estrogen levels: Secondary | ICD-10-CM | POA: Diagnosis not present

## 2023-01-22 DIAGNOSIS — Z79811 Long term (current) use of aromatase inhibitors: Secondary | ICD-10-CM | POA: Insufficient documentation

## 2023-01-22 DIAGNOSIS — D61818 Other pancytopenia: Secondary | ICD-10-CM

## 2023-01-22 DIAGNOSIS — Z23 Encounter for immunization: Secondary | ICD-10-CM | POA: Diagnosis not present

## 2023-01-22 DIAGNOSIS — Z8543 Personal history of malignant neoplasm of ovary: Secondary | ICD-10-CM | POA: Diagnosis present

## 2023-01-22 DIAGNOSIS — C786 Secondary malignant neoplasm of retroperitoneum and peritoneum: Secondary | ICD-10-CM | POA: Insufficient documentation

## 2023-01-22 DIAGNOSIS — C569 Malignant neoplasm of unspecified ovary: Secondary | ICD-10-CM

## 2023-01-22 DIAGNOSIS — C787 Secondary malignant neoplasm of liver and intrahepatic bile duct: Secondary | ICD-10-CM | POA: Diagnosis not present

## 2023-01-22 LAB — CBC WITH DIFFERENTIAL (CANCER CENTER ONLY)
Abs Immature Granulocytes: 0.04 10*3/uL (ref 0.00–0.07)
Basophils Absolute: 0 10*3/uL (ref 0.0–0.1)
Basophils Relative: 0 %
Eosinophils Absolute: 0 10*3/uL (ref 0.0–0.5)
Eosinophils Relative: 0 %
HCT: 38.7 % (ref 36.0–46.0)
Hemoglobin: 13.1 g/dL (ref 12.0–15.0)
Immature Granulocytes: 0 %
Lymphocytes Relative: 11 %
Lymphs Abs: 1.4 10*3/uL (ref 0.7–4.0)
MCH: 28.7 pg (ref 26.0–34.0)
MCHC: 33.9 g/dL (ref 30.0–36.0)
MCV: 84.9 fL (ref 80.0–100.0)
Monocytes Absolute: 0.8 10*3/uL (ref 0.1–1.0)
Monocytes Relative: 6 %
Neutro Abs: 10.6 10*3/uL — ABNORMAL HIGH (ref 1.7–7.7)
Neutrophils Relative %: 83 %
Platelet Count: 327 10*3/uL (ref 150–400)
RBC: 4.56 MIL/uL (ref 3.87–5.11)
RDW: 12.8 % (ref 11.5–15.5)
WBC Count: 12.8 10*3/uL — ABNORMAL HIGH (ref 4.0–10.5)
nRBC: 0 % (ref 0.0–0.2)

## 2023-01-22 LAB — COMPREHENSIVE METABOLIC PANEL
ALT: 13 U/L (ref 0–44)
AST: 15 U/L (ref 15–41)
Albumin: 4.2 g/dL (ref 3.5–5.0)
Alkaline Phosphatase: 85 U/L (ref 38–126)
Anion gap: 8 (ref 5–15)
BUN: 30 mg/dL — ABNORMAL HIGH (ref 8–23)
CO2: 25 mmol/L (ref 22–32)
Calcium: 9.7 mg/dL (ref 8.9–10.3)
Chloride: 102 mmol/L (ref 98–111)
Creatinine, Ser: 1.09 mg/dL — ABNORMAL HIGH (ref 0.44–1.00)
GFR, Estimated: 56 mL/min — ABNORMAL LOW (ref >60)
Glucose, Bld: 134 mg/dL — ABNORMAL HIGH (ref 70–99)
Potassium: 4.1 mmol/L (ref 3.5–5.1)
Sodium: 135 mmol/L (ref 135–145)
Total Bilirubin: 0.4 mg/dL (ref 0.3–1.2)
Total Protein: 7 g/dL (ref 6.5–8.1)

## 2023-01-22 MED ORDER — LEUPROLIDE ACETATE 3.75 MG IM KIT
3.7500 mg | PACK | Freq: Once | INTRAMUSCULAR | Status: AC
  Administered 2023-01-22: 3.75 mg via INTRAMUSCULAR
  Filled 2023-01-22: qty 3.75

## 2023-01-28 ENCOUNTER — Encounter: Payer: Self-pay | Admitting: Gynecologic Oncology

## 2023-01-30 NOTE — Progress Notes (Signed)
Gynecologic Oncology Return Clinic Visit  01/31/23  Reason for Visit: surveillance  Treatment History: Oncology History Overview Note  2018 - Core biopsy for ER/PR and Foundation One testing performed. Unfortunately, no sufficient tissue for Foundation One testing. ER was 50% and PR 90%. 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     - 09/2000 Chemotherapy   6 cycles of IP cisplatin and etoposide    02/2009 Relapse/Recurrence   CT showed increased size of nodules in pelvis   2010 Surgery   Exlap with section of tumor nodules near cecum and left pelvic sidewall. Tumor: ER negative, PR positive    Treatment Plan Change   Alternated 2 week courses of Megace and Tamoxifen - ended 03/2012   03/2012 PET scan   CT - progressive disease   2013 Treatment Plan Change   Letrozole   01/2016 Imaging   MRI showed progressive disease   2017 Treatment Plan Change   Two weeks of alternating Tamoxifen 20mg  daily and then Megace 40mg  TID   08/2016 Imaging   MRI showed progressive disease with peritoneal implants near liver, in pelvis   01/2017 Treatment Plan Change   Lupron 11.25 q 3 months   11/2017 Imaging   Overall mixed response.   Mixed cystic/solid lesions in the left pelvis are mildly improved.   Cystic peritoneal disease, including the dominant lesion  along the posterior right hepatic lobe, is mildly progressed.   Subcapsular lesion along the posterior right hepatic lobe is unchanged.   03/2018 Imaging   MRI A/P: Mixed response of individual peritoneal metastases in the pelvis, as described above. Overall, there has been no significant change in bulk of disease.   Stable cystic peritoneal metastatic disease along the capsular surfaces of the liver and spleen.   No new sites of metastatic disease identified within the abdomen or pelvis.   08/2018 Imaging   MRI A/P: Status post hysterectomy and bilateral salpingo-oophorectomy.   Mixed cystic/solid peritoneal implants in the abdomen/pelvis, as above. Dominant cystic implant along the posterior liver surface is mildly increased. Remaining lesions are overall grossly unchanged.   No new lesions are identified.   01/28/2019 Imaging   Mri A/P: 1. Relatively similar appearance of peritoneal metastasis. A posterior right hepatic capsular based lesion is similar to minimally decreased in size. Left pelvic implants are primarily similar with possible enlargement of an anterior high left pelvic cystic implant. No new disease identified. 2.  Aortic Atherosclerosis (ICD10-I70.0). 3. Hepatic steatosis. 4. Left adrenal adenoma.   06/02/2019 Imaging   MRI 1. Potential slight enlargement of dominant cystic area and solid component, associated with rind like signal variation on T2 along the inferior right hepatic margin, also potentially slightly increased. Findings may still be within the realm of measurement and technical variation. Close attention on follow-up. 2. Subtle cystic changes along the cephalad margin of the spleen are difficult to see on previous imaging, perhaps new compared with prior imaging studies. 3. Pelvic implants  and left lower quadrant lesion with similar size, of the area along the left iliac vasculature may be slightly larger than on the prior study. 4. Signs of extensive  retroperitoneal and pelvic lymphadenectomy. 5. Hepatic steatosis. 6. 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     06/08/2019 Cancer Staging   Staging form: Ovary, AJCC 7th Edition - Clinical: Stage IIIC (rT2, N1, M0) - Signed by Artis Delay, MD on 06/08/2019   06/10/2019 Imaging   1. 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 These findings are slightly worsened in comparison to a remote prior CT examination dated 10/04/2014.   2.  No evidence of metastatic disease in the chest.   3. Status post hysterectomy, pelvic and retroperitoneal lymph node dissection, and ventral hernia mesh repair.   4.  Hepatic steatosis.   5.  Aortic Atherosclerosis (ICD10-I70.0).   06/24/2019 - 09/02/2019 Chemotherapy   The patient had carboplatin for chemotherapy treatment.     09/02/2019 Tumor Marker   Patient's tumor was tested for the following markers: Inhibin B Results of the tumor marker test revealed 94   09/23/2019 Imaging   1. Interval progression of the soft tissue lesions in the anterior left pelvis, likely peritoneal implants, compatible with disease progression. Remaining sites of apparent disease along the liver capsule and posterior spleen are stable 2. Stable 2 cm left adrenal adenoma. 3. Hepatic steatosis. 4. Right-side predominant colonic diverticulosis without diverticulitis. 5. Aortic Atherosclerosis (ICD10-I70.0).   12/23/2019 Imaging   1. Slight interval increase in size of a mixed solid and cystic nodule in the left hemipelvis measuring 3.0 x 2.9 cm, previously 2.7 x 2.1 cm. 2. Interval decrease in size of a nodule in the left paracolic gutter measuring 1.0 x 0.8 cm, previously 2.1 x 1.6 cm. 3. Stable peritoneal nodules in the hepatorenal recess and at the  inferior tip of the spleen. 4. Unchanged nodule or lymph node overlying the left external iliac artery. 5. Enlargement of dominant left pelvic nodule is concerning for disease progression despite interval decrease in size of a nodule in the left paracolic gutter and stability of other nodules. 6. No evidence of metastatic disease in the chest. 7. Stable, benign left adrenal adenoma. 8. Ventral hernia mesh repair with a small component of recurrent hernia inferiorly, containing a single nonobstructed loop of small bowel. 9. Hepatic steatosis. 10. Aortic Atherosclerosis (ICD10-I70.0).   12/23/2019 Tumor Marker   Patient's tumor was tested for the following markers: Inhibin B Results of the tumor marker test revealed 94.9   01/25/2020 Tumor Marker   Patient's tumor was tested for the following markers: Inhibin B Results of the tumor marker test revealed 116.7   02/20/2020 Genetic Testing   Negative genetic testing: no pathogenic variants detected in [facility] Multi-Cancer Panel.  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  The report date is February 20, 2020.   The Multi-Cancer Panel offered by [facility] includes sequencing and/or deletion duplication testing of the following 85 genes: AIP, ALK, APC, ATM, AXIN2,BAP1,  BARD1, BLM, BMPR1A, BRCA1, BRCA2, BRIP1, CASR, CDC73, CDH1, CDK4, CDKN1B, CDKN1C, CDKN2A (p14ARF), CDKN2A (p16INK4a), CEBPA, CHEK2, CTNNA1, DICER1, DIS3L2, EGFR (c.2369C>T, p.Thr790Met variant only), EPCAM (Deletion/duplication testing only), FH, FLCN, GATA2, GPC3, GREM1 (Promoter region deletion/duplication testing only), HOXB13 (c.251G>A, p.Gly84Glu), HRAS, KIT, MAX, MEN1,  MET, MITF (c.952G>A, p.Glu318Lys variant only), MLH1, MSH2, MSH3, MSH6, MUTYH, NBN, NF1, NF2, NTHL1, PALB2, PDGFRA, PHOX2B, PMS2, POLD1, POLE, POT1, PRKAR1A, PTCH1, PTEN, RAD50, RAD51C, RAD51D, RB1, RECQL4, RET, RNF43, RUNX1, SDHAF2, SDHA (sequence changes only), SDHB, SDHC, SDHD, SMAD4,  SMARCA4, SMARCB1, SMARCE1, STK11, SUFU, TERC, TERT, TMEM127, TP53, TSC1, TSC2, VHL, WRN and WT1.    03/02/2020 Tumor Marker   Patient's tumor was tested for the following markers: Inhibin B Results of the tumor marker test revealed 80.8   04/17/2020 Imaging   1. Overall, exam is stable. Multiple peritoneal nodules are again seen. The index nodule in the left lower quadrant is mildly increased in size in the interval. The lesion within the anterior left pelvis has decreased in size in the interval. There has also been decrease in size of cystic lesion within the a hepatorenal recess. The remaining peritoneal lesions are unchanged. No new lesions identified. 2. Stable left adrenal nodule. 3.  Aortic Atherosclerosis (ICD10-I70.0).     08/21/2020 Imaging   Bone density is normal AP spine T score 0.8 Femoral neck on the left, T score -0.2 Femoral neck on the right ,T score -0.4   10/17/2020 Imaging   1. Interval progression of peritoneal nodules along the left pelvic sidewall, concerning for progressive metastatic disease. 2. Small capsular lesion medial right liver and the apparent cystic lesion superior to the right kidney are similar to prior. 3. Tiny soft tissue nodule anterior aspect of the lateral left pelvis described as decreasing on the prior study has decreased further on today's exam. 4. Stable left adrenal nodule. This cannot be definitively characterized. 5. Tiny nonobstructing stone lower pole right kidney. 6. Aortic Atherosclerosis (ICD10-I70.0).   10/26/2020 Procedure   Successful placement of a right internal jugular approach power injectable Port-A-Cath. The catheter is ready for immediate use.       10/31/2020 - 10/04/2021 Chemotherapy   Patient is on Treatment Plan : OVARIAN Paclitaxel X5,2,84,13 q28d     01/25/2021 Imaging   1. Signs of peritoneal disease with scattered peritoneal nodules. The index lesions are either stable or decreased in size in the interval as detailed  above. No new sites of disease. 2. Ventral pelvic wall hernia contains a nonobstructed loop of small bowel. 3. Aortic Atherosclerosis (ICD10-I70.0).     06/14/2021 Imaging   1. Multiple peritoneal metastatic nodules are slightly diminished in size. 2. A left external iliac lymph node or nodule is however stable to slightly enlarged. 3. Overall findings are most consistent with continued treatment response of metastatic disease. 4. No evidence of metastatic disease within the chest. 5. Hepatic steatosis.     06/14/2021 Imaging   Right: Findings consistent with 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   Left: No evidence of thrombosis in the subclavian.   06/20/2021 Procedure   Successful right IJ vein Port-A-Cath explant.     10/18/2021 Imaging   1. Similar to mild interval increase in size of left external iliac lymph node. 2. Multiple peritoneal metastatic nodules are grossly unchanged in size when compared to prior exam   02/14/2022 Imaging   1. Enlarging lymph node along the LEFT iliac chain, compatible with mild worsening of disease in this area. Stable stigmata of disease in the upper abdomen. 2. Post LEFT retroperitoneal and pelvic lymphadenectomy. 3. Stable LEFT adrenal adenoma. 4. Stable 4 mm pulmonary nodule in the LEFT lung base. 5. Aortic atherosclerosis. 6. Mild hepatic steatosis. 7. Post abdominal wall  reconstruction with hernia below the mesh as on previous imaging.     05/24/2022 Imaging   1. Exam is generally stable with no substantial interval change and no clear trend towards improving or progressive disease. There is one tiny soft tissue nodule along the medial capsule of the posterior right liver measuring 8 x 5 mm, more conspicuous than on the prior study. Close attention on follow-up recommended. 2. A second capsular implant along the medial liver capsule more anteriorly measures smaller on  today's exam. Additional capsular implants along the medial liver and inferior spleen are stable. The left iliac lymph node identified as enlarging on the previous study is not substantially changed in the interval. 3. Stable 2 cm left adrenal nodule, previously characterized as adenoma. 4. Stable 4 mm left lower lobe pulmonary nodule. 5. Similar appearance of ventral hernia repair with recurrence. 6.  Aortic Atherosclerosis (ICD10-I70.0).   08/28/2022 Imaging   Bone density scan is normal   11/21/2022 Imaging   CT ABDOMEN PELVIS W CONTRAST  Result Date: 11/20/2022 CLINICAL DATA:  Ovarian cancer, assess treatment response * Tracking Code: BO * EXAM: CT ABDOMEN AND PELVIS WITH CONTRAST TECHNIQUE: Multidetector CT imaging of the abdomen and pelvis was performed using the standard protocol following bolus administration of intravenous contrast. RADIATION DOSE REDUCTION: This exam was performed according to the departmental dose-optimization program which includes automated exposure control, adjustment of the mA and/or kV according to patient size and/or use of iterative reconstruction technique. CONTRAST:  OMNIPAQUE IOHEXOL 300 MG/ML SOLN additional oral enteric contrast COMPARISON:  05/23/2022 FINDINGS: Lower chest: No acute abnormality. Hepatobiliary: No solid liver abnormality is seen. Hepatic steatosis. No gallstones, gallbladder wall thickening, or biliary dilatation. Pancreas: Unremarkable. No pancreatic ductal dilatation or surrounding inflammatory changes. Spleen: Normal in size. Unchanged partially exophytic cyst arising from the inferior portion of the spleen (series 2, image 21) Adrenals/Urinary Tract: Unchanged, definitively benign macroscopic fat containing left adrenal adenoma, for which no further follow-up or characterization is required (series 2, image 21). Simple, benign bilateral renal cortical cysts, for which no further follow-up or characterization is required. Kidneys are otherwise  normal, without renal calculi, solid lesion, or hydronephrosis. Bladder is unremarkable. Stomach/Bowel: Stomach is within normal limits. Appendix appears normal. No evidence of bowel wall thickening, distention, or inflammatory changes. Colonic diverticulosis. Moderate burden of stool throughout the colon and rectum. Status post omentectomy. Vascular/Lymphatic: Aortic atherosclerosis. Slight interval enlargement of a left iliac lymph node, measuring 1.9 x 1.7 cm, previously 1.7 x 1.5 cm (series 2, image 60). Reproductive: Status post hysterectomy and oophorectomy. Other: Status post midline ventral hernia mesh repair. No ascites. Unchanged cyst in the vicinity of the posterior medial right lobe of the liver measuring 3.1 x 2.6 cm (series 2, image 23). Similar appearance of minimal, somewhat nodular soft tissue thickening in the right hepatorenal recess in this vicinity (series 2, image 23). Slight interval enlargement of a soft tissue nodule in the inferior left paracolic gutter measuring 1.0 cm, previously 0.7 cm (series 2, image 63). Musculoskeletal: No acute or significant osseous findings. IMPRESSION: 1. Slight interval enlargement of a left iliac lymph node, concerning for nodal metastatic disease. 2. Slight interval enlargement of a soft tissue nodule in the inferior left paracolic gutter, concerning for minimally worsened peritoneal metastatic disease. Similar appearance of cystic peritoneal implants and minimal, somewhat nodular soft tissue thickening in the right hepatorenal recess. 3. Status post hysterectomy and oophorectomy. 4. Hepatic steatosis. 5. Colonic diverticulosis. Aortic Atherosclerosis (ICD10-I70.0). Electronically Signed  By: Jearld Lesch M.D.   On: 11/20/2022 16:36       Bone density 08/2022: normal  Interval History: Doing well.  Tolerating treatment with some hot flashes, no significant change.  Denies any abdominal or pelvic pain.  Has felt intermittently a knot along the left side  of her back, sometimes painful.  Reports constipation, using MiraLAX and laxatives as needed.  Denies any vaginal bleeding.  Past Medical/Surgical History: Past Medical History:  Diagnosis Date   Anxiety    Arthritis    Cervical syndrome    CKD (chronic kidney disease), stage III (HCC)    Constipation    COVID-19 03/26/2019   Family history of colon cancer 02/15/2020   Fatty liver    GERD (gastroesophageal reflux disease)    Granulosa cell tumor of ovary    History of hiatal hernia    Hyperlipidemia    Joint pain    Neck pain    Obesity    Osteoarthritis    Ovarian cancer (HCC)    PONV (postoperative nausea and vomiting)    history of n/v,  past surgeries no n/v   Sleep apnea    uses CPAP   Tremor     Past Surgical History:  Procedure Laterality Date   ABDOMINAL HYSTERECTOMY     TAH/BSO   APPENDECTOMY     EXPLORATORY LAPAROTOMY     for bowel obstruction   IR IMAGING GUIDED PORT INSERTION  10/25/2020   IR REMOVAL TUN ACCESS W/ PORT W/O FL MOD SED  06/19/2021   left toe surgery Left    Secondary tumor debulking  2002   SHOULDER SURGERY     2017 left   Tumor debulking  2010   VENTRAL HERNIA REPAIR      Family History  Problem Relation Age of Onset   Basal cell carcinoma Mother        66s   Thyroid disease Mother    Stroke Father    Colon cancer Father 49   Hypertension Father    Heart disease Father    Colon cancer Paternal Uncle        dx 10s   Colon cancer Cousin        maternal cousin; dx 80s    Social History   Socioeconomic History   Marital status: Married    Spouse name: Suzanne Kho   Number of children: 2   Years of education: college   Highest education level: Not on file  Occupational History   Occupation: RN  Tobacco Use   Smoking status: Never   Smokeless tobacco: Never  Vaping Use   Vaping status: Never Used  Substance and Sexual Activity   Alcohol use: Not Currently   Drug use: No   Sexual activity: Not Currently  Other Topics  Concern   Not on file  Social History Narrative   Lives at home with husband.   Right-handed.   One cup caffeine daily.   Social Determinants of Health   Financial Resource Strain: Not on file  Food Insecurity: Not on file  Transportation Needs: Not on file  Physical Activity: Not on file  Stress: Not on file  Social Connections: Not on file    Current Medications:  Current Outpatient Medications:    acetaminophen (TYLENOL) 500 MG tablet, Take 1,000 mg by mouth every 6 (six) hours as needed for moderate pain., Disp: , Rfl:    anastrozole (ARIMIDEX) 1 MG tablet, Take 1 tablet (1 mg total) by  mouth daily., Disp: 90 tablet, Rfl: 3   aspirin EC 81 MG tablet, Take 81 mg by mouth daily. Swallow whole., Disp: , Rfl:    atorvastatin (LIPITOR) 40 MG tablet, Take 1 tablet (40 mg total) by mouth daily., Disp: 90 tablet, Rfl: 3   Biotin 5 MG TABS, Take 5 mg by mouth daily., Disp: , Rfl:    buPROPion (WELLBUTRIN SR) 200 MG 12 hr tablet, Take 1 tablet (200 mg total) by mouth 2 (two) times daily., Disp: 180 tablet, Rfl: 1   escitalopram (LEXAPRO) 20 MG tablet, Take 1 tablet (20 mg total) by mouth daily., Disp: 90 tablet, Rfl: 2   ezetimibe (ZETIA) 10 MG tablet, Take 1 tablet (10 mg total) by mouth daily., Disp: 90 tablet, Rfl: 3   gabapentin (NEURONTIN) 300 MG capsule, Take 1 capsule (300 mg total) by mouth 2 (two) times daily., Disp: 60 capsule, Rfl: 3   isosorbide mononitrate (IMDUR) 30 MG 24 hr tablet, Take 30 mg by mouth daily., Disp: , Rfl:    leuprolide (LUPRON) 30 MG injection, Inject 30 mg into the muscle every 30 (thirty) days., Disp: , Rfl:    metFORMIN (GLUCOPHAGE) 500 MG tablet, Take 1 tablet (500 mg total) by mouth every morning AND 2 tablets (1,000 mg total) every evening. Needs office visit., Disp: 270 tablet, Rfl: 1   mirabegron ER (MYRBETRIQ) 50 MG TB24 tablet, Take 1 tablet (50 mg total) by mouth daily., Disp: 30 tablet, Rfl: 11   omeprazole (PRILOSEC) 20 MG capsule, Take 1  capsule (20 mg total) by mouth daily., Disp: 90 capsule, Rfl: 2   Polyethyl Glycol-Propyl Glycol (SYSTANE OP), Place 1 drop into both eyes daily as needed (dry eyes)., Disp: , Rfl:    Probiotic Product (ALIGN PO), Take 1 tablet by mouth daily. , Disp: , Rfl:    psyllium (METAMUCIL) 58.6 % powder, Take 1 packet by mouth 2 (two) times daily., Disp: , Rfl:    tretinoin (RETIN-A) 0.025 % cream, Apply 1 application to the face Nightly., Disp: 20 g, Rfl: 2   triamcinolone cream (KENALOG) 0.1 %, Apply 1 small Application topically 2 (two) times daily to affected area., Disp: 454 g, Rfl: 0   Vitamin D, Ergocalciferol, (DRISDOL) 1.25 MG (50000 UNIT) CAPS capsule, Take 1 capsule by mouth every 10 days., Disp: 9 capsule, Rfl: 1  Review of Systems: Denies appetite changes, fevers, chills, fatigue, unexplained weight changes. Denies hearing loss, neck lumps or masses, mouth sores, ringing in ears or voice changes. Denies cough or wheezing.  Denies shortness of breath. Denies chest pain or palpitations. Denies leg swelling. Denies abdominal distention, pain, blood in stools, constipation, diarrhea, nausea, vomiting, or early satiety. Denies pain with intercourse, dysuria, frequency, hematuria or incontinence. Denies hot flashes, pelvic pain, vaginal bleeding or vaginal discharge.   Denies joint pain, back pain or muscle pain/cramps. Denies itching, rash, or wounds. Denies dizziness, headaches, numbness or seizures. Denies swollen lymph nodes or glands, denies easy bruising or bleeding. Denies anxiety, depression, confusion, or decreased concentration.  Physical Exam: BP (!) 133/56 (BP Location: Left Arm)   Pulse 65   Temp 97.9 F (36.6 C) (Oral)   Resp 19   Wt 190 lb (86.2 kg)   SpO2 100%   BMI 30.67 kg/m  General: Alert, oriented, no acute distress. HEENT: Normocephalic, atraumatic, sclera anicteric. Chest: Clear to auscultation bilaterally.  No wheezes or rhonchi. Cardiovascular: Regular rate  and rhythm, no murmurs. Abdomen: soft, nontender.  Normoactive bowel sounds.  No  masses or hepatosplenomegaly appreciated.  Well-healed scars. Extremities: Grossly normal range of motion.  Warm, well perfused.  No edema bilaterally. Skin: No rashes or lesions noted. Lymphatics: No cervical, supraclavicular, or inguinal adenopathy. GU: Normal appearing external genitalia without erythema, excoriation, or lesions.  Speculum exam reveals mildly atrophic vaginal mucosa, no lesions.  Bimanual exam reveals cuff intact and smooth with, no labial cysts or other masses noted.  Rectovaginal exam confirms findings.  Laboratory & Radiologic Studies: CT A/P on 11/18/22: 1. Slight interval enlargement of a left iliac lymph node, concerning for nodal metastatic disease. 2. Slight interval enlargement of a soft tissue nodule in the inferior left paracolic gutter, concerning for minimally worsened peritoneal metastatic disease. Similar appearance of cystic peritoneal implants and minimal, somewhat nodular soft tissue thickening in the right hepatorenal recess. 3. Status post hysterectomy and oophorectomy. 4. Hepatic steatosis. 5. Colonic diverticulosis.            Component Ref Range & Units 6 mo ago (08/01/22) 2 yr ago (05/04/20) 2 yr ago (03/01/20) 3 yr ago (01/24/20) 3 yr ago (12/21/19) 3 yr ago (09/22/19) 3 yr ago (09/01/19)  Inhibin B 0.0 - 16.9 pg/mL 42.2 High  112.0 High  CM 80.8 High  CM 116.7 High  CM 94.9 High  CM 95.9 High  CM 94.0 High  CM     Assessment & Plan: [name] is a 67 y.o. woman with a history of granulosa cell tumor of the ovary, originally diagnosed approximately 30 years ago, who has been well controlled with stable disease on hormonal therapy.   The patient is overall doing well and remains asymptomatic in terms of her disease.  She continues to tolerate hormonal therapy with Lupron and an aromatase inhibitor.  We discussed most recent CT scan results which shows a mild  progression of several lesions, no new significant lesions.  Larger lesions continue to be at the lower aspect of the liver on the right, behind the pancreas and above the renal vasculature, and along the left pelvic brim.  Given relatively stable lesions and that the patient is asymptomatic, I agree with continued current treatment.  We will check an inhibin B today.  If she continues to have increased size of multiple lesions, may need to discuss change in therapy versus reconsideration of surgery.  She continues to have a good appreciation for the complexity of surgery if undertaken.  She sees Dr. Bertis Ruddy in November with plan for repeat imaging at the end of the year.  22 minutes of total time was spent for this patient encounter, including preparation, face-to-face counseling with the patient and coordination of care, and documentation of the encounter.  Eugene Garnet, MD  Division of Gynecologic Oncology  Department of Obstetrics and Gynecology  [facility]

## 2023-01-31 ENCOUNTER — Inpatient Hospital Stay: Payer: 59 | Admitting: Gynecologic Oncology

## 2023-01-31 ENCOUNTER — Inpatient Hospital Stay: Payer: 59

## 2023-01-31 ENCOUNTER — Encounter: Payer: Self-pay | Admitting: Gynecologic Oncology

## 2023-01-31 VITALS — BP 133/56 | HR 65 | Temp 97.9°F | Resp 19 | Wt 190.0 lb

## 2023-01-31 DIAGNOSIS — Z8543 Personal history of malignant neoplasm of ovary: Secondary | ICD-10-CM | POA: Diagnosis not present

## 2023-01-31 DIAGNOSIS — C569 Malignant neoplasm of unspecified ovary: Secondary | ICD-10-CM

## 2023-01-31 DIAGNOSIS — C787 Secondary malignant neoplasm of liver and intrahepatic bile duct: Secondary | ICD-10-CM | POA: Diagnosis not present

## 2023-01-31 DIAGNOSIS — R232 Flushing: Secondary | ICD-10-CM

## 2023-01-31 DIAGNOSIS — Z8719 Personal history of other diseases of the digestive system: Secondary | ICD-10-CM

## 2023-01-31 DIAGNOSIS — C786 Secondary malignant neoplasm of retroperitoneum and peritoneum: Secondary | ICD-10-CM | POA: Diagnosis not present

## 2023-01-31 DIAGNOSIS — D61818 Other pancytopenia: Secondary | ICD-10-CM

## 2023-01-31 NOTE — Patient Instructions (Signed)
It was good to see you today.    I will see you for follow-up in 6 months.  I will let you know when your inhibin B level is back.  As always, if you develop any new and concerning symptoms before your next visit, please call to see me sooner.

## 2023-02-03 LAB — INHIBIN B: Inhibin B: 56 pg/mL — ABNORMAL HIGH (ref 0.0–16.9)

## 2023-02-06 ENCOUNTER — Inpatient Hospital Stay: Payer: 59

## 2023-02-06 ENCOUNTER — Other Ambulatory Visit: Payer: Self-pay

## 2023-02-06 DIAGNOSIS — Z23 Encounter for immunization: Secondary | ICD-10-CM

## 2023-02-06 DIAGNOSIS — C786 Secondary malignant neoplasm of retroperitoneum and peritoneum: Secondary | ICD-10-CM | POA: Diagnosis not present

## 2023-02-06 MED ORDER — INFLUENZA VAC A&B SURF ANT ADJ 0.5 ML IM SUSY
0.5000 mL | PREFILLED_SYRINGE | Freq: Once | INTRAMUSCULAR | Status: DC
  Filled 2023-02-06: qty 0.5

## 2023-02-06 MED ORDER — INFLUENZA VAC A&B SURF ANT ADJ 0.5 ML IM SUSY
0.5000 mL | PREFILLED_SYRINGE | Freq: Once | INTRAMUSCULAR | Status: AC
  Administered 2023-02-06: 0.5 mL via INTRAMUSCULAR
  Filled 2023-02-06: qty 0.5

## 2023-02-06 NOTE — Patient Instructions (Signed)

## 2023-02-07 ENCOUNTER — Other Ambulatory Visit: Payer: Self-pay

## 2023-02-07 ENCOUNTER — Other Ambulatory Visit (HOSPITAL_COMMUNITY): Payer: Self-pay

## 2023-02-07 DIAGNOSIS — G4733 Obstructive sleep apnea (adult) (pediatric): Secondary | ICD-10-CM | POA: Diagnosis not present

## 2023-02-07 DIAGNOSIS — H9193 Unspecified hearing loss, bilateral: Secondary | ICD-10-CM | POA: Diagnosis not present

## 2023-02-07 DIAGNOSIS — Z0000 Encounter for general adult medical examination without abnormal findings: Secondary | ICD-10-CM | POA: Diagnosis not present

## 2023-02-07 DIAGNOSIS — K219 Gastro-esophageal reflux disease without esophagitis: Secondary | ICD-10-CM | POA: Diagnosis not present

## 2023-02-07 DIAGNOSIS — D3910 Neoplasm of uncertain behavior of unspecified ovary: Secondary | ICD-10-CM | POA: Diagnosis not present

## 2023-02-07 DIAGNOSIS — Z800 Family history of malignant neoplasm of digestive organs: Secondary | ICD-10-CM | POA: Diagnosis not present

## 2023-02-07 DIAGNOSIS — H61303 Acquired stenosis of external ear canal, unspecified, bilateral: Secondary | ICD-10-CM | POA: Diagnosis not present

## 2023-02-07 DIAGNOSIS — E669 Obesity, unspecified: Secondary | ICD-10-CM | POA: Diagnosis not present

## 2023-02-07 DIAGNOSIS — Z1231 Encounter for screening mammogram for malignant neoplasm of breast: Secondary | ICD-10-CM | POA: Diagnosis not present

## 2023-02-07 DIAGNOSIS — E7800 Pure hypercholesterolemia, unspecified: Secondary | ICD-10-CM | POA: Diagnosis not present

## 2023-02-07 DIAGNOSIS — F322 Major depressive disorder, single episode, severe without psychotic features: Secondary | ICD-10-CM | POA: Diagnosis not present

## 2023-02-07 DIAGNOSIS — N3281 Overactive bladder: Secondary | ICD-10-CM | POA: Diagnosis not present

## 2023-02-07 MED ORDER — ESCITALOPRAM OXALATE 20 MG PO TABS
20.0000 mg | ORAL_TABLET | Freq: Every day | ORAL | 3 refills | Status: DC
  Filled 2023-02-07: qty 90, 90d supply, fill #0
  Filled 2023-05-12: qty 90, 90d supply, fill #1

## 2023-02-07 MED ORDER — OMEPRAZOLE 20 MG PO CPDR
20.0000 mg | DELAYED_RELEASE_CAPSULE | Freq: Every day | ORAL | 3 refills | Status: DC
  Filled 2023-02-07: qty 90, 90d supply, fill #0
  Filled 2023-05-12: qty 90, 90d supply, fill #1
  Filled 2023-11-10: qty 90, 90d supply, fill #2

## 2023-02-08 DIAGNOSIS — R300 Dysuria: Secondary | ICD-10-CM | POA: Diagnosis not present

## 2023-02-10 ENCOUNTER — Other Ambulatory Visit: Payer: Self-pay | Admitting: Internal Medicine

## 2023-02-10 DIAGNOSIS — Z1231 Encounter for screening mammogram for malignant neoplasm of breast: Secondary | ICD-10-CM

## 2023-02-23 ENCOUNTER — Encounter: Payer: Self-pay | Admitting: Internal Medicine

## 2023-02-23 IMAGING — US IR IMAGING GUIDED PORT INSERTION
1 series · 3 of 3 positions shown · non-contrast
Comparison: none

INDICATION: Ovarian malignancy

[Series 1: ir imaging guided port insertion · 3 of 3 slices shown]
[im 1/3]
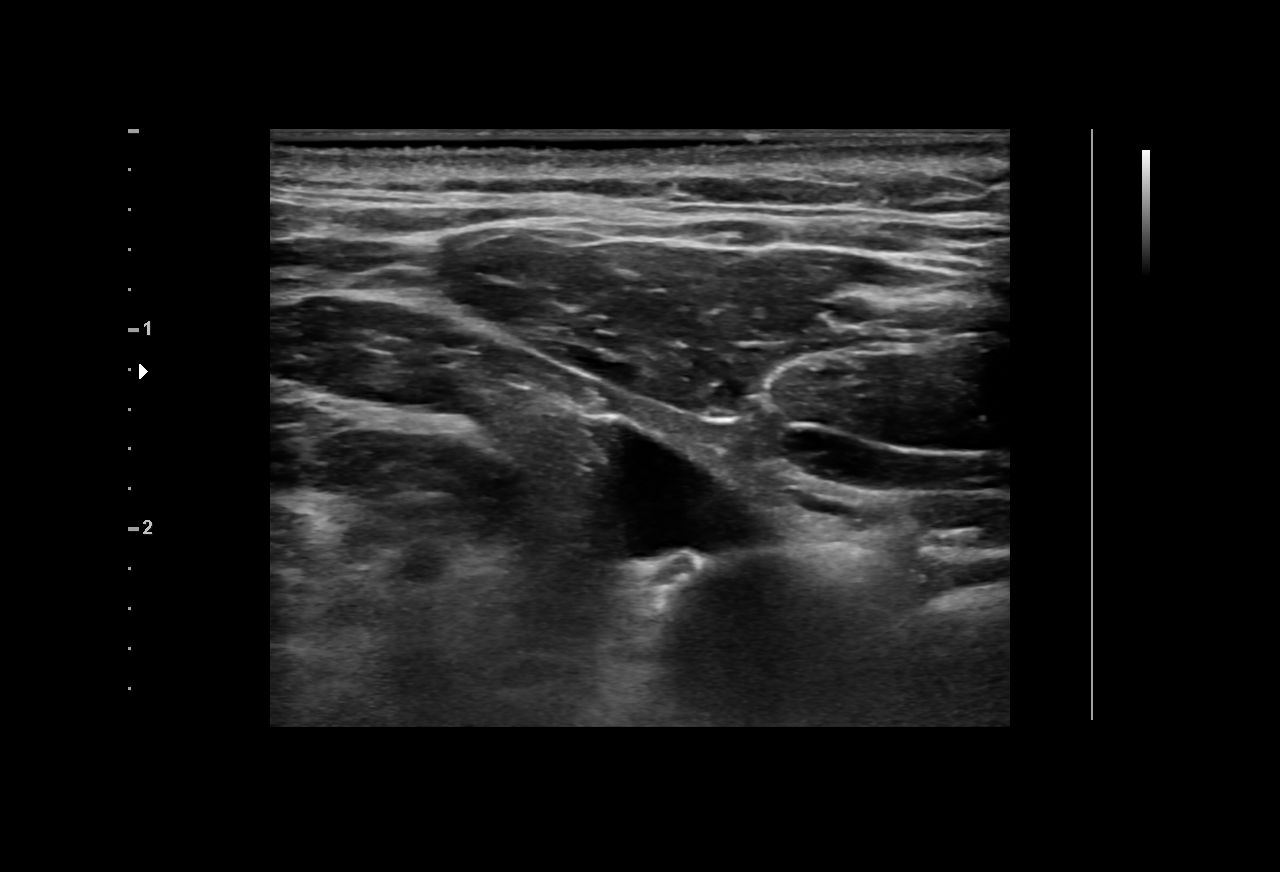
[im 2/3]
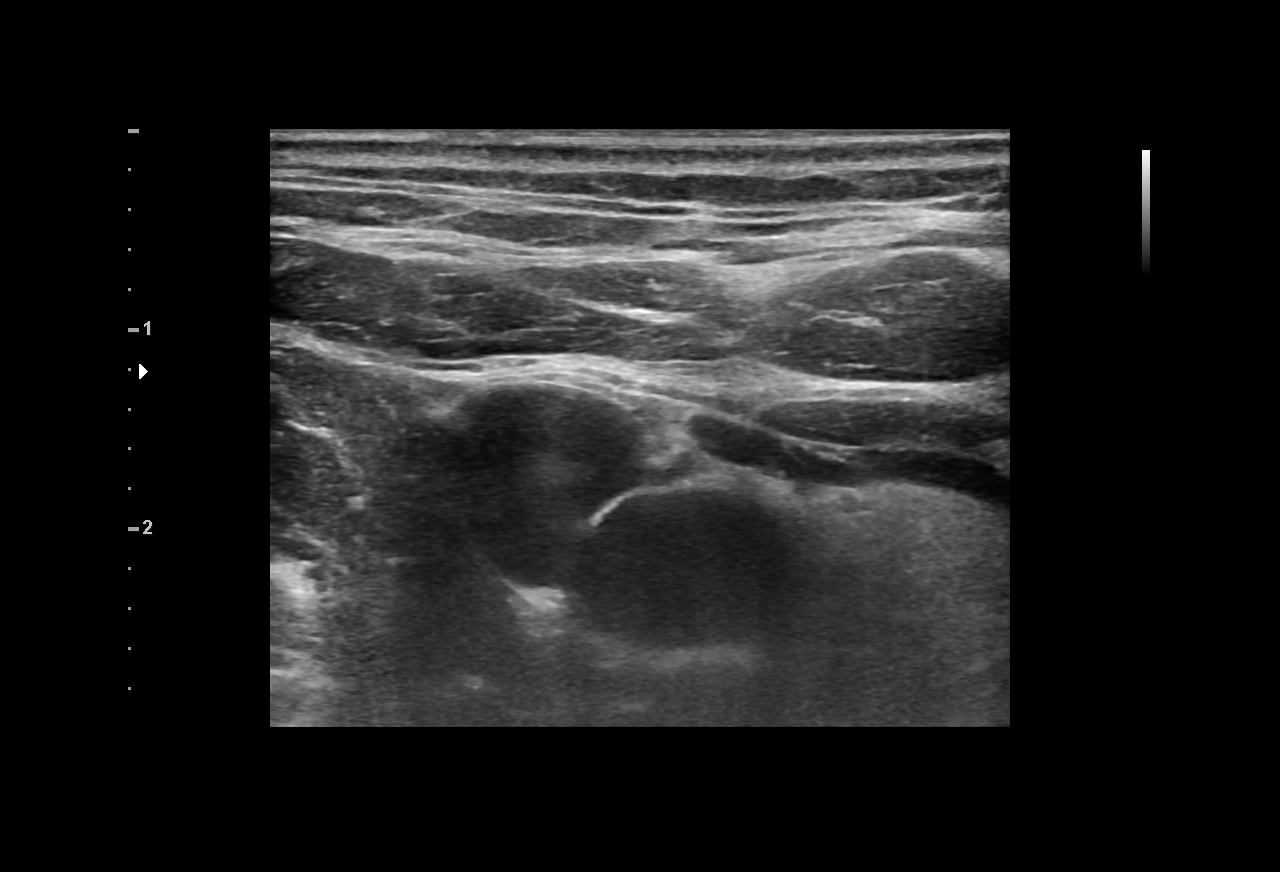
[im 3/3]
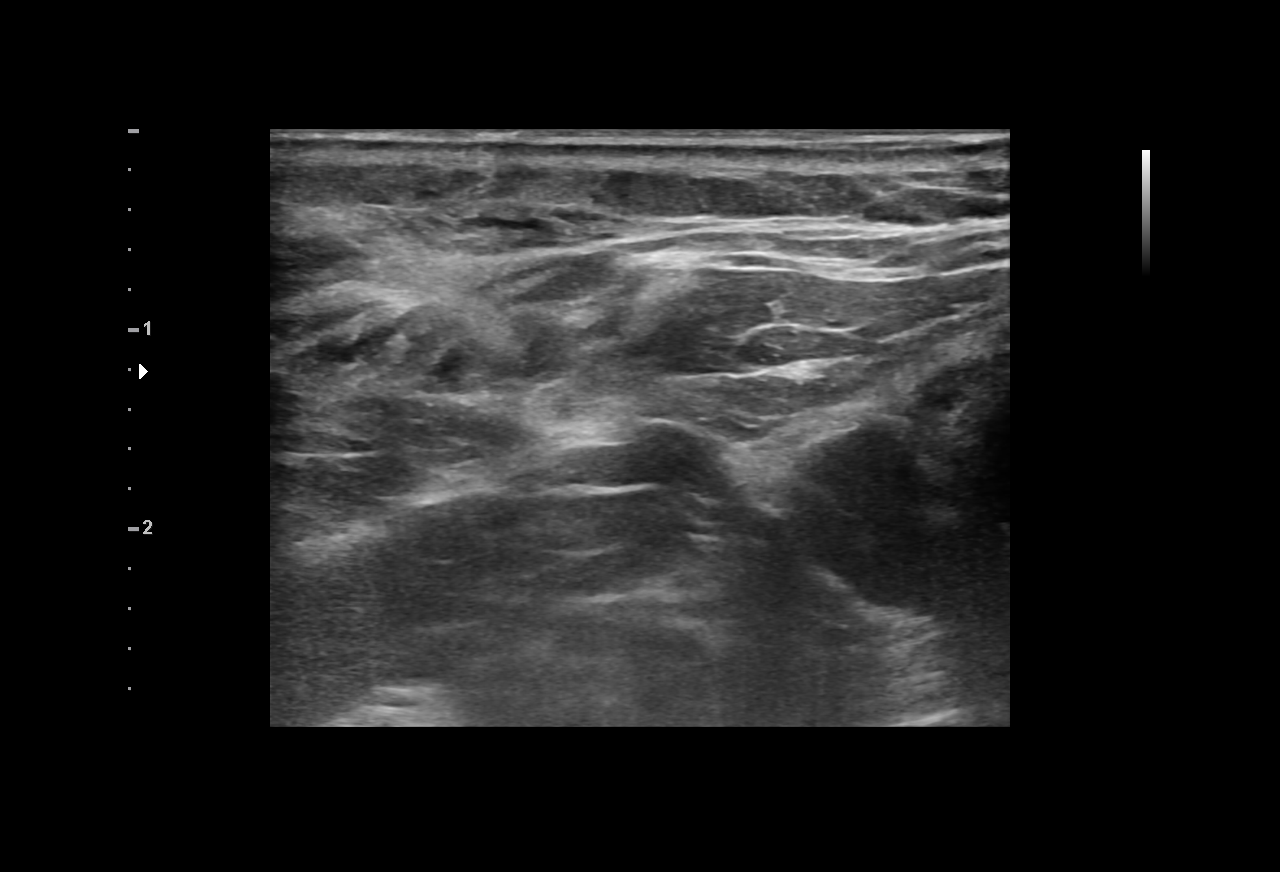

[3 of 3 positions shown; findings below may reference images not displayed]

EXAM:
IMPLANTED PORT A CATH PLACEMENT WITH ULTRASOUND AND FLUOROSCOPIC
GUIDANCE

MEDICATIONS:
None

ANESTHESIA/SEDATION:
Moderate (conscious) sedation was employed during this procedure. A
total of Versed 2 mg and Fentanyl 100 mcg was administered
intravenously.

Moderate Sedation Time: 15 minutes. The patient's level of
consciousness and vital signs were monitored continuously by
radiology nursing throughout the procedure under my direct
supervision.

FLUOROSCOPY TIME:  0 minutes, 24 seconds (2 mGy)

COMPLICATIONS:
None immediate.

PROCEDURE:
The procedure, risks, benefits, and alternatives were explained to
the patient. Questions regarding the procedure were encouraged and
answered. The patient understands and consents to the procedure.

A timeout was performed prior to the initiation of the procedure.

Patient positioned supine on the angiography table.

Right neck and anterior upper chest prepped and draped in the usual
sterile fashion. All elements of maximal sterile barrier were
utilized including, cap, mask, sterile gown, sterile gloves, large
sterile drape, hand scrubbing and 2% Chlorhexidine for skin
cleaning.

The right internal jugular vein was evaluated with ultrasound and
shown to be patent. A permanent ultrasound image was obtained and
placed in the patient's medical record. Local anesthesia was
provided with 1% lidocaine with epinephrine.

Using sterile gel and a sterile probe cover, the right internal
jugular vein was entered with a 21 ga needle during real time
ultrasound guidance.

0.018 inch guidewire placed and 21 ga needle exchanged for
transitional dilator set. Utilizing fluoroscopy, 0.035 inch
guidewire advanced through the needle without difficulty.

Attention then turned to the right anterior upper chest. Following
local lidocaine administration, a port pocket was created. The
catheter was connected to the port and brought from the pocket to
the venotomy site through a subcutaneous tunnel.

The catheter was cut to size and inserted through the peel-away
sheath. The catheter tip was positioned at the cavoatrial junction
using fluoroscopic guidance.

The port aspirated and flushed well. The port pocket was closed with
deep and superficial absorbable suture. The port pocket incision and
venotomy sites were also sealed with Dermabond.
IMPRESSION: Successful placement of a right internal jugular approach power
injectable Port-A-Cath. The catheter is ready for immediate use.

## 2023-02-24 NOTE — Telephone Encounter (Signed)
Dyann Kief, PA-C  You5 hours ago (9:20 AM)    That's unfortunate as LDL came down nicely on it. Please stop for 2-3 weeks and then let's try crestor 10 mg once daily. Repeat FLP in 3 months. Thanks

## 2023-02-24 NOTE — Telephone Encounter (Signed)
Reviewed with Jacolyn Reedy, PA--  Stay on zetia if she's willing and just stop Lipitor for a couple weeks and see if symptoms improve before starting crestor. Thanks

## 2023-02-25 DIAGNOSIS — E7800 Pure hypercholesterolemia, unspecified: Secondary | ICD-10-CM | POA: Diagnosis not present

## 2023-02-26 ENCOUNTER — Ambulatory Visit: Payer: 59

## 2023-02-26 ENCOUNTER — Other Ambulatory Visit: Payer: 59

## 2023-02-27 ENCOUNTER — Other Ambulatory Visit: Payer: 59

## 2023-02-27 ENCOUNTER — Encounter: Payer: Self-pay | Admitting: Hematology and Oncology

## 2023-02-27 ENCOUNTER — Other Ambulatory Visit: Payer: Self-pay | Admitting: Hematology and Oncology

## 2023-02-27 ENCOUNTER — Inpatient Hospital Stay: Payer: 59 | Attending: Hematology

## 2023-02-27 ENCOUNTER — Inpatient Hospital Stay (HOSPITAL_BASED_OUTPATIENT_CLINIC_OR_DEPARTMENT_OTHER): Payer: 59 | Admitting: Hematology and Oncology

## 2023-02-27 ENCOUNTER — Inpatient Hospital Stay: Payer: 59

## 2023-02-27 VITALS — BP 129/74 | HR 57 | Temp 97.9°F | Resp 18 | Ht 66.0 in | Wt 193.6 lb

## 2023-02-27 DIAGNOSIS — N951 Menopausal and female climacteric states: Secondary | ICD-10-CM | POA: Diagnosis not present

## 2023-02-27 DIAGNOSIS — R232 Flushing: Secondary | ICD-10-CM | POA: Insufficient documentation

## 2023-02-27 DIAGNOSIS — C569 Malignant neoplasm of unspecified ovary: Secondary | ICD-10-CM

## 2023-02-27 DIAGNOSIS — C786 Secondary malignant neoplasm of retroperitoneum and peritoneum: Secondary | ICD-10-CM | POA: Insufficient documentation

## 2023-02-27 LAB — COMPREHENSIVE METABOLIC PANEL
ALT: 15 U/L (ref 0–44)
AST: 16 U/L (ref 15–41)
Albumin: 3.9 g/dL (ref 3.5–5.0)
Alkaline Phosphatase: 85 U/L (ref 38–126)
Anion gap: 8 (ref 5–15)
BUN: 22 mg/dL (ref 8–23)
CO2: 25 mmol/L (ref 22–32)
Calcium: 9.2 mg/dL (ref 8.9–10.3)
Chloride: 104 mmol/L (ref 98–111)
Creatinine, Ser: 1.1 mg/dL — ABNORMAL HIGH (ref 0.44–1.00)
GFR, Estimated: 55 mL/min — ABNORMAL LOW (ref >60)
Glucose, Bld: 109 mg/dL — ABNORMAL HIGH (ref 70–99)
Potassium: 4.2 mmol/L (ref 3.5–5.1)
Sodium: 137 mmol/L (ref 135–145)
Total Bilirubin: 0.6 mg/dL (ref <1.2)
Total Protein: 6.9 g/dL (ref 6.5–8.1)

## 2023-02-27 MED ORDER — LEUPROLIDE ACETATE 3.75 MG IM KIT
3.7500 mg | PACK | Freq: Once | INTRAMUSCULAR | Status: AC
  Administered 2023-02-27: 3.75 mg via INTRAMUSCULAR
  Filled 2023-02-27: qty 3.75

## 2023-02-27 NOTE — Assessment & Plan Note (Signed)
She denies worsening hot flashes Monitor closely

## 2023-02-27 NOTE — Progress Notes (Signed)
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   All questions were answered. The patient knows to call the clinic with any problems, questions or concerns. The total time spent in the appointment was 20 minutes encounter with patients including review of chart and various tests results, discussions about plan of care and coordination of care plan   Artis Delay, MD 02/27/2023 10:35 AM  INTERVAL HISTORY: Please see below for problem oriented charting. she returns for treatment follow-up She tolerated treatment well except for mild hot flashes which is manageable She have no other new symptoms Denies abdominal pain or changes in bowel habits  REVIEW OF SYSTEMS:   Constitutional: Denies fevers, chills or abnormal weight loss Eyes: Denies blurriness of vision Ears, nose, mouth, throat, and face: Denies mucositis or sore throat Respiratory: Denies cough, dyspnea or wheezes Cardiovascular: Denies palpitation, chest discomfort or lower extremity swelling Gastrointestinal:  Denies nausea, heartburn or change in bowel habits Skin: Denies abnormal skin rashes Lymphatics: Denies new lymphadenopathy or easy bruising Neurological:Denies numbness, tingling or new weaknesses Behavioral/Psych: Mood is stable, no new changes  All other systems were reviewed with the patient and are negative.  I  have reviewed the past medical history, past surgical history, social history and family history with the patient and they are unchanged from previous note.  ALLERGIES:  is allergic to codeine, hydrocodone-acetaminophen, thimerosal (thiomersal), ciprofloxacin, daypro [oxaprozin], levofloxacin, stadol [butorphanol tartrate], and sulfa antibiotics.  MEDICATIONS:  Current Outpatient Medications  Medication Sig Dispense Refill   acetaminophen (TYLENOL) 500 MG tablet Take 1,000 mg by mouth every 6 (six) hours as needed for moderate pain.     anastrozole (ARIMIDEX) 1 MG tablet Take 1 tablet (1 mg total) by mouth daily. 90 tablet 3   aspirin EC 81 MG tablet Take 81 mg by mouth daily. Swallow whole.     atorvastatin (LIPITOR) 40 MG tablet Take 1 tablet (40 mg total) by mouth daily. 90 tablet 3   Biotin 5 MG TABS Take 5 mg by mouth daily.     buPROPion (WELLBUTRIN SR) 200 MG 12 hr tablet Take 1 tablet (200 mg total) by mouth 2 (two) times daily. 180 tablet 1   escitalopram (LEXAPRO) 20 MG tablet Take 1 tablet (20 mg total) by mouth daily. 90 tablet 3   ezetimibe (ZETIA) 10 MG tablet Take 1 tablet (10 mg total) by mouth daily. 90 tablet 3   gabapentin (NEURONTIN) 300 MG capsule Take 1 capsule (300 mg total) by mouth 2 (two) times daily. 60 capsule 3   isosorbide mononitrate (IMDUR) 30 MG 24 hr tablet Take 30 mg by mouth daily.     leuprolide (LUPRON) 30 MG injection Inject 30 mg into the muscle every 30 (thirty) days.     metFORMIN (GLUCOPHAGE) 500 MG tablet Take 1 tablet (500 mg total) by mouth every morning AND 2 tablets (1,000 mg total) every evening. Needs office  visit. 270 tablet 1   mirabegron ER (MYRBETRIQ) 50 MG TB24 tablet Take 1 tablet (50 mg total) by mouth daily. 30 tablet 11   omeprazole (PRILOSEC) 20 MG capsule Take 1 capsule (20 mg total) by mouth daily. 90 capsule 3   Polyethyl Glycol-Propyl Glycol (SYSTANE OP) Place 1 drop into both eyes daily as needed (dry eyes).     Probiotic Product  (ALIGN PO) Take 1 tablet by mouth daily.      psyllium (METAMUCIL) 58.6 % powder Take 1 packet by mouth 2 (two) times daily.     tretinoin (RETIN-A) 0.025 % cream Apply 1 application to the face Nightly. 20 g 2   triamcinolone cream (KENALOG) 0.1 % Apply 1 small Application topically 2 (two) times daily to affected area. 454 g 0   Vitamin D, Ergocalciferol, (DRISDOL) 1.25 MG (50000 UNIT) CAPS capsule Take 1 capsule by mouth every 10 days. 9 capsule 1   No current facility-administered medications for this visit.    SUMMARY OF ONCOLOGIC HISTORY: Oncology History Overview Note  2018 - Core biopsy for ER/PR and Foundation One testing performed. Unfortunately, no sufficient tissue for Foundation One testing. ER was 50% and PR 90%. 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     - 09/2000 Chemotherapy   6 cycles of IP cisplatin and etoposide    02/2009 Relapse/Recurrence   CT showed increased size of nodules in pelvis   2010 Surgery   Exlap with section of tumor nodules near cecum and left pelvic sidewall. Tumor: ER negative, PR positive    Treatment Plan Change   Alternated 2 week courses of Megace and Tamoxifen - ended 03/2012   03/2012 PET scan   CT - progressive disease   2013 Treatment Plan Change   Letrozole   01/2016 Imaging   MRI showed progressive  disease   2017 Treatment Plan Change   Two weeks of alternating Tamoxifen 20mg  daily and then Megace 40mg  TID   08/2016 Imaging   MRI showed progressive disease with peritoneal implants near liver, in pelvis   01/2017 Treatment Plan Change   Lupron 11.25 q 3 months   11/2017 Imaging   Overall mixed response.   Mixed cystic/solid lesions in the left pelvis are mildly improved.   Cystic peritoneal disease, including the dominant lesion along the posterior right hepatic lobe, is mildly progressed.   Subcapsular lesion along the posterior right hepatic lobe is unchanged.   03/2018 Imaging   MRI A/P: Mixed response of individual peritoneal metastases in the pelvis, as described above. Overall, there has been no significant change in bulk of disease.   Stable cystic peritoneal metastatic disease along the capsular surfaces of the liver and spleen.   No new sites of metastatic disease identified within the abdomen or pelvis.   08/2018 Imaging   MRI A/P: Status post hysterectomy and bilateral salpingo-oophorectomy.   Mixed cystic/solid peritoneal implants in the abdomen/pelvis, as above. Dominant cystic implant along the  posterior liver surface is mildly increased. Remaining lesions are overall grossly unchanged.   No new lesions are identified.   01/28/2019 Imaging   Mri A/P: 1. Relatively similar appearance of peritoneal metastasis. A posterior right hepatic capsular based lesion is similar to minimally decreased in size. Left pelvic implants are primarily similar with possible enlargement of an anterior high left pelvic cystic implant. No new disease identified. 2.  Aortic Atherosclerosis (ICD10-I70.0). 3. Hepatic steatosis. 4. Left adrenal adenoma.   06/02/2019 Imaging   MRI 1. Potential slight enlargement of dominant cystic area and solid component, associated with rind like signal variation on T2 along the inferior right hepatic margin, also potentially slightly increased.  Findings may still be within the realm of measurement and technical variation. Close attention on follow-up. 2. Subtle cystic changes along the cephalad margin of the spleen are difficult to see on previous imaging, perhaps new compared with prior imaging studies. 3. Pelvic implants and left lower quadrant lesion with similar size, of the area along the left iliac vasculature may be slightly larger than on the prior study. 4. Signs of extensive retroperitoneal and pelvic lymphadenectomy. 5. Hepatic steatosis. 6. 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     06/08/2019 Cancer Staging   Staging form: Ovary, AJCC 7th Edition - Clinical: Stage IIIC (rT2, N1, M0) - Signed by Artis Delay, MD on 06/08/2019   06/10/2019 Imaging   1. 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 These findings are slightly worsened in comparison to a remote prior CT examination dated 10/04/2014.   2.  No evidence of metastatic disease in the chest.   3. Status post hysterectomy, pelvic and retroperitoneal lymph node dissection, and ventral hernia mesh repair.   4.  Hepatic steatosis.   5.  Aortic Atherosclerosis (ICD10-I70.0).   06/24/2019 - 09/02/2019 Chemotherapy   The patient had carboplatin for chemotherapy treatment.     09/02/2019 Tumor Marker   Patient's tumor was tested for the following markers: Inhibin B Results of the tumor marker test revealed 94   09/23/2019 Imaging   1. Interval progression of the soft tissue lesions in the anterior left pelvis, likely peritoneal implants, compatible with disease progression. Remaining sites of apparent disease along the liver capsule and posterior spleen are stable 2. Stable 2 cm left adrenal adenoma. 3. Hepatic steatosis. 4. Right-side  predominant colonic diverticulosis without diverticulitis. 5. Aortic Atherosclerosis (ICD10-I70.0).   12/23/2019 Imaging   1. Slight interval increase in size of a mixed solid and cystic nodule in the left hemipelvis measuring 3.0 x 2.9 cm, previously 2.7 x 2.1 cm. 2. Interval decrease in size of a nodule in the left paracolic gutter measuring 1.0 x 0.8 cm, previously 2.1 x 1.6 cm. 3. Stable peritoneal nodules in the hepatorenal recess and at the inferior tip of the spleen. 4. Unchanged nodule or lymph node overlying the left external iliac artery. 5. Enlargement of dominant left pelvic nodule is concerning for disease progression despite interval decrease in size of a nodule in the left paracolic gutter and stability of other nodules. 6. No evidence of metastatic disease in the chest. 7. Stable, benign left adrenal adenoma. 8. Ventral hernia mesh repair with a small component of recurrent hernia inferiorly, containing a single nonobstructed loop of small bowel. 9. Hepatic steatosis. 10.  Aortic Atherosclerosis (ICD10-I70.0).   12/23/2019 Tumor Marker   Patient's tumor was tested for the following markers: Inhibin B Results of the tumor marker test revealed 94.9   01/25/2020 Tumor Marker   Patient's tumor was tested for the following markers: Inhibin B Results of the tumor marker test revealed 116.7   02/20/2020 Genetic Testing   Negative genetic testing: no pathogenic variants detected in [facility] Multi-Cancer Panel.  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  The report date is February 20, 2020.   The Multi-Cancer Panel offered by [facility] includes sequencing and/or deletion duplication testing of the following 85 genes: AIP, ALK, APC, ATM, AXIN2,BAP1,  BARD1, BLM, BMPR1A, BRCA1, BRCA2, BRIP1, CASR, CDC73, CDH1, CDK4, CDKN1B, CDKN1C, CDKN2A (p14ARF), CDKN2A (p16INK4a), CEBPA, CHEK2, CTNNA1, DICER1, DIS3L2, EGFR (c.2369C>T, p.Thr790Met variant only), EPCAM  (Deletion/duplication testing only), FH, FLCN, GATA2, GPC3, GREM1 (Promoter region deletion/duplication testing only), HOXB13 (c.251G>A, p.Gly84Glu), HRAS, KIT, MAX, MEN1, MET, MITF (c.952G>A, p.Glu318Lys variant only), MLH1, MSH2, MSH3, MSH6, MUTYH, NBN, NF1, NF2, NTHL1, PALB2, PDGFRA, PHOX2B, PMS2, POLD1, POLE, POT1, PRKAR1A, PTCH1, PTEN, RAD50, RAD51C, RAD51D, RB1, RECQL4, RET, RNF43, RUNX1, SDHAF2, SDHA (sequence changes only), SDHB, SDHC, SDHD, SMAD4, SMARCA4, SMARCB1, SMARCE1, STK11, SUFU, TERC, TERT, TMEM127, TP53, TSC1, TSC2, VHL, WRN and WT1.    03/02/2020 Tumor Marker   Patient's tumor was tested for the following markers: Inhibin B Results of the tumor marker test revealed 80.8   04/17/2020 Imaging   1. Overall, exam is stable. Multiple peritoneal nodules are again seen. The index nodule in the left lower quadrant is mildly increased in size in the interval. The lesion within the anterior left pelvis has decreased in size in the interval. There has also been decrease in size of cystic lesion within the a hepatorenal recess. The remaining peritoneal lesions are unchanged. No new lesions identified. 2. Stable left adrenal nodule. 3.  Aortic Atherosclerosis (ICD10-I70.0).     08/21/2020 Imaging   Bone density is normal AP spine T score 0.8 Femoral neck on the left, T score -0.2 Femoral neck on the right ,T score -0.4   10/17/2020 Imaging   1. Interval progression of peritoneal nodules along the left pelvic sidewall, concerning for progressive metastatic disease. 2. Small capsular lesion medial right liver and the apparent cystic lesion superior to the right kidney are similar to prior. 3. Tiny soft tissue nodule anterior aspect of the lateral left pelvis described as decreasing on the prior study has decreased further on today's exam. 4. Stable left adrenal nodule. This cannot be definitively characterized. 5. Tiny nonobstructing stone lower pole right kidney. 6. Aortic Atherosclerosis  (ICD10-I70.0).   10/26/2020 Procedure   Successful placement of a right internal jugular approach power injectable Port-A-Cath. The catheter is ready for immediate use.       10/31/2020 - 10/04/2021 Chemotherapy   Patient is on Treatment Plan : OVARIAN Paclitaxel U0,4,54,09 q28d     01/25/2021 Imaging   1. Signs of peritoneal disease with scattered peritoneal nodules. The index lesions are either stable or decreased in size in the interval as detailed above. No new sites of disease. 2. Ventral pelvic wall hernia contains a nonobstructed loop of small bowel. 3. Aortic Atherosclerosis (ICD10-I70.0).     06/14/2021 Imaging   1. Multiple peritoneal metastatic nodules are slightly diminished in size. 2. A left external iliac lymph node or nodule is however stable to slightly enlarged. 3. Overall findings are most consistent with continued treatment response of metastatic disease. 4. No evidence  of metastatic disease within the chest. 5. Hepatic steatosis.     06/14/2021 Imaging   Right: Findings consistent with 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   Left: No evidence of thrombosis in the subclavian.   06/20/2021 Procedure   Successful right IJ vein Port-A-Cath explant.     10/18/2021 Imaging   1. Similar to mild interval increase in size of left external iliac lymph node. 2. Multiple peritoneal metastatic nodules are grossly unchanged in size when compared to prior exam   02/14/2022 Imaging   1. Enlarging lymph node along the LEFT iliac chain, compatible with mild worsening of disease in this area. Stable stigmata of disease in the upper abdomen. 2. Post LEFT retroperitoneal and pelvic lymphadenectomy. 3. Stable LEFT adrenal adenoma. 4. Stable 4 mm pulmonary nodule in the LEFT lung base. 5. Aortic atherosclerosis. 6. Mild hepatic steatosis. 7. Post abdominal wall reconstruction with hernia below the mesh as on  previous imaging.     05/24/2022 Imaging   1. Exam is generally stable with no substantial interval change and no clear trend towards improving or progressive disease. There is one tiny soft tissue nodule along the medial capsule of the posterior right liver measuring 8 x 5 mm, more conspicuous than on the prior study. Close attention on follow-up recommended. 2. A second capsular implant along the medial liver capsule more anteriorly measures smaller on today's exam. Additional capsular implants along the medial liver and inferior spleen are stable. The left iliac lymph node identified as enlarging on the previous study is not substantially changed in the interval. 3. Stable 2 cm left adrenal nodule, previously characterized as adenoma. 4. Stable 4 mm left lower lobe pulmonary nodule. 5. Similar appearance of ventral hernia repair with recurrence. 6.  Aortic Atherosclerosis (ICD10-I70.0).   08/28/2022 Imaging   Bone density scan is normal   11/21/2022 Imaging   CT ABDOMEN PELVIS W CONTRAST  Result Date: 11/20/2022 CLINICAL DATA:  Ovarian cancer, assess treatment response * Tracking Code: BO * EXAM: CT ABDOMEN AND PELVIS WITH CONTRAST TECHNIQUE: Multidetector CT imaging of the abdomen and pelvis was performed using the standard protocol following bolus administration of intravenous contrast. RADIATION DOSE REDUCTION: This exam was performed according to the departmental dose-optimization program which includes automated exposure control, adjustment of the mA and/or kV according to patient size and/or use of iterative reconstruction technique. CONTRAST:  OMNIPAQUE IOHEXOL 300 MG/ML SOLN additional oral enteric contrast COMPARISON:  05/23/2022 FINDINGS: Lower chest: No acute abnormality. Hepatobiliary: No solid liver abnormality is seen. Hepatic steatosis. No gallstones, gallbladder wall thickening, or biliary dilatation. Pancreas: Unremarkable. No pancreatic ductal dilatation or surrounding  inflammatory changes. Spleen: Normal in size. Unchanged partially exophytic cyst arising from the inferior portion of the spleen (series 2, image 21) Adrenals/Urinary Tract: Unchanged, definitively benign macroscopic fat containing left adrenal adenoma, for which no further follow-up or characterization is required (series 2, image 21). Simple, benign bilateral renal cortical cysts, for which no further follow-up or characterization is required. Kidneys are otherwise normal, without renal calculi, solid lesion, or hydronephrosis. Bladder is unremarkable. Stomach/Bowel: Stomach is within normal limits. Appendix appears normal. No evidence of bowel wall thickening, distention, or inflammatory changes. Colonic diverticulosis. Moderate burden of stool throughout the colon and rectum. Status post omentectomy. Vascular/Lymphatic: Aortic atherosclerosis. Slight interval enlargement of a left iliac lymph node, measuring 1.9 x 1.7 cm, previously 1.7 x 1.5 cm (series 2, image 60). Reproductive: Status post hysterectomy  and oophorectomy. Other: Status post midline ventral hernia mesh repair. No ascites. Unchanged cyst in the vicinity of the posterior medial right lobe of the liver measuring 3.1 x 2.6 cm (series 2, image 23). Similar appearance of minimal, somewhat nodular soft tissue thickening in the right hepatorenal recess in this vicinity (series 2, image 23). Slight interval enlargement of a soft tissue nodule in the inferior left paracolic gutter measuring 1.0 cm, previously 0.7 cm (series 2, image 63). Musculoskeletal: No acute or significant osseous findings. IMPRESSION: 1. Slight interval enlargement of a left iliac lymph node, concerning for nodal metastatic disease. 2. Slight interval enlargement of a soft tissue nodule in the inferior left paracolic gutter, concerning for minimally worsened peritoneal metastatic disease. Similar appearance of cystic peritoneal implants and minimal, somewhat nodular soft tissue  thickening in the right hepatorenal recess. 3. Status post hysterectomy and oophorectomy. 4. Hepatic steatosis. 5. Colonic diverticulosis. Aortic Atherosclerosis (ICD10-I70.0). Electronically Signed   By: Jearld Lesch M.D.   On: 11/20/2022 16:36        PHYSICAL EXAMINATION: ECOG PERFORMANCE STATUS: 0 - Asymptomatic  Vitals:   02/27/23 0832  BP: 129/74  Pulse: (!) 57  Resp: 18  Temp: 97.9 F (36.6 C)  SpO2: 97%   Filed Weights   02/27/23 0832  Weight: 193 lb 9.6 oz (87.8 kg)    GENERAL:alert, no distress and comfortable   LABORATORY DATA:  I have reviewed the data as listed    Component Value Date/Time   NA 137 02/27/2023 0806   NA 140 07/09/2021 0813   NA 141 09/24/2013 1131   K 4.2 02/27/2023 0806   K 4.5 09/24/2013 1131   CL 104 02/27/2023 0806   CO2 25 02/27/2023 0806   CO2 24 09/24/2013 1131   GLUCOSE 109 (H) 02/27/2023 0806   GLUCOSE 90 09/24/2013 1131   BUN 22 02/27/2023 0806   BUN 20 07/09/2021 0813   BUN 21.2 09/24/2013 1131   CREATININE 1.10 (H) 02/27/2023 0806   CREATININE 1.19 (H) 06/20/2021 1513   CREATININE 1.1 09/24/2013 1131   CALCIUM 9.2 02/27/2023 0806   CALCIUM 9.5 09/24/2013 1131   PROT 6.9 02/27/2023 0806   PROT 6.7 11/02/2021 0000   PROT 6.8 09/24/2013 1131   ALBUMIN 3.9 02/27/2023 0806   ALBUMIN 4.4 11/02/2021 0000   ALBUMIN 3.9 09/24/2013 1131   AST 16 02/27/2023 0806   AST 21 06/20/2021 1513   AST 23 09/24/2013 1131   ALT 15 02/27/2023 0806   ALT 19 06/20/2021 1513   ALT 27 09/24/2013 1131   ALKPHOS 85 02/27/2023 0806   ALKPHOS 81 09/24/2013 1131   BILITOT 0.6 02/27/2023 0806   BILITOT 0.4 11/02/2021 0000   BILITOT 0.4 06/20/2021 1513   BILITOT 0.53 09/24/2013 1131   GFRNONAA 55 (L) 02/27/2023 0806   GFRNONAA 51 (L) 06/20/2021 1513   GFRAA 55 (L) 01/24/2020 1530   GFRAA 57 (L) 12/21/2019 1259    No results found for: "SPEP", "UPEP"  Lab Results  Component Value Date   WBC 12.8 (H) 01/22/2023   NEUTROABS 10.6 (H)  01/22/2023   HGB 13.1 01/22/2023   HCT 38.7 01/22/2023   MCV 84.9 01/22/2023   PLT 327 01/22/2023      Chemistry      Component Value Date/Time   NA 137 02/27/2023 0806   NA 140 07/09/2021 0813   NA 141 09/24/2013 1131   K 4.2 02/27/2023 0806   K 4.5 09/24/2013 1131   CL 104 02/27/2023  0806   CO2 25 02/27/2023 0806   CO2 24 09/24/2013 1131   BUN 22 02/27/2023 0806   BUN 20 07/09/2021 0813   BUN 21.2 09/24/2013 1131   CREATININE 1.10 (H) 02/27/2023 0806   CREATININE 1.19 (H) 06/20/2021 1513   CREATININE 1.1 09/24/2013 1131      Component Value Date/Time   CALCIUM 9.2 02/27/2023 0806   CALCIUM 9.5 09/24/2013 1131   ALKPHOS 85 02/27/2023 0806   ALKPHOS 81 09/24/2013 1131   AST 16 02/27/2023 0806   AST 21 06/20/2021 1513   AST 23 09/24/2013 1131   ALT 15 02/27/2023 0806   ALT 19 06/20/2021 1513   ALT 27 09/24/2013 1131   BILITOT 0.6 02/27/2023 0806   BILITOT 0.4 11/02/2021 0000   BILITOT 0.4 06/20/2021 1513   BILITOT 0.53 09/24/2013 1131

## 2023-02-27 NOTE — Assessment & Plan Note (Signed)
Her last imaging showed slight progression but overall the changes are mild and she is not symptomatic The plan will be to continue on current treatment with combination of Lupron monthly and aromatase inhibitor daily I plan to repeat imaging study again end of the year

## 2023-02-27 NOTE — Progress Notes (Signed)
Per Dr. Bertis Ruddy - okay to proceed with Lupron injection without CBC today.

## 2023-03-05 ENCOUNTER — Ambulatory Visit (INDEPENDENT_AMBULATORY_CARE_PROVIDER_SITE_OTHER): Payer: 59 | Admitting: Ophthalmology

## 2023-03-05 ENCOUNTER — Encounter (INDEPENDENT_AMBULATORY_CARE_PROVIDER_SITE_OTHER): Payer: Self-pay | Admitting: Ophthalmology

## 2023-03-05 DIAGNOSIS — H35371 Puckering of macula, right eye: Secondary | ICD-10-CM

## 2023-03-05 DIAGNOSIS — H25813 Combined forms of age-related cataract, bilateral: Secondary | ICD-10-CM | POA: Diagnosis not present

## 2023-03-05 DIAGNOSIS — H33311 Horseshoe tear of retina without detachment, right eye: Secondary | ICD-10-CM

## 2023-03-05 DIAGNOSIS — H3321 Serous retinal detachment, right eye: Secondary | ICD-10-CM | POA: Diagnosis not present

## 2023-03-05 MED ORDER — PREDNISOLONE ACETATE 1 % OP SUSP: 1.0000 [drp] | Freq: Four times a day (QID) | OPHTHALMIC | 0 refills | Status: AC

## 2023-03-05 NOTE — Progress Notes (Signed)
[facility] - Clinic Note  03/05/2023   CHIEF COMPLAINT Patient presents for Retina Evaluation  HISTORY OF PRESENT ILLNESS: [name] is a 67 y.o. female who presents to the clinic today for:  HPI     Retina Evaluation   In right eye.  This started 1 month ago.  Duration of 1 month.  Associated Symptoms Floaters.  Negative for Flashes, Distortion, Blind Spot, Pain, Redness, Photophobia, Glare, Trauma, Scalp Tenderness, Jaw Claudication, Shoulder/Hip pain, Fever, Weight Loss and Fatigue.  Context:  distance vision, mid-range vision and near vision.  Treatments tried include no treatments.  I, the attending physician,  performed the HPI with the patient and updated documentation appropriately.        Comments   Patient states referred by Dr. Zenaida Niece for retinal tear OD. Patient has had floaters OD>OS for a long time. No worsening of floaters recently. No flashes. Patient is pre-diabetic and takes metformin, but does not check BS. Patient complains of blurry vision OD for about a month and went to have checked at Dr. Raeanne Gathers today. History of myopia.      Last edited by Rennis Chris, MD on 03/05/2023  4:02 PM.    Pt is here on the referral of Dr. Zenaida Niece for concern of HST OD, pt states she started seeing floaters about 3-4 weeks ago, she denies fol, she states she has had floaters in the past, but they have disappeared, she states her vision in the left eye has been blurry for about a month, she denies being diabetic or hypertensive   Referring physician: Diona Foley, MD [id number] [place],  [place] [address]  HISTORICAL INFORMATION:  Selected notes from the MEDICAL RECORD NUMBER Referred by Dr. Zenaida Niece for HST LEE:  Ocular Hx- PMH-   CURRENT MEDICATIONS: Current Outpatient Medications (Ophthalmic Drugs)  Medication Sig   Polyethyl Glycol-Propyl Glycol (SYSTANE OP) Place 1 drop into both eyes daily as needed (dry eyes).   prednisoLONE acetate (PRED FORTE) 1  % ophthalmic suspension Place 1 drop into the right eye 4 (four) times daily for 7 days.   No current facility-administered medications for this visit. (Ophthalmic Drugs)   Current Outpatient Medications (Other)  Medication Sig   acetaminophen (TYLENOL) 500 MG tablet Take 1,000 mg by mouth every 6 (six) hours as needed for moderate pain.   anastrozole (ARIMIDEX) 1 MG tablet Take 1 tablet (1 mg total) by mouth daily.   aspirin EC 81 MG tablet Take 81 mg by mouth daily. Swallow whole.   atorvastatin (LIPITOR) 40 MG tablet Take 1 tablet (40 mg total) by mouth daily.   Biotin 5 MG TABS Take 5 mg by mouth daily.   buPROPion (WELLBUTRIN SR) 200 MG 12 hr tablet Take 1 tablet (200 mg total) by mouth 2 (two) times daily.   escitalopram (LEXAPRO) 20 MG tablet Take 1 tablet (20 mg total) by mouth daily.   ezetimibe (ZETIA) 10 MG tablet Take 1 tablet (10 mg total) by mouth daily.   gabapentin (NEURONTIN) 300 MG capsule Take 1 capsule (300 mg total) by mouth 2 (two) times daily.   isosorbide mononitrate (IMDUR) 30 MG 24 hr tablet Take 30 mg by mouth daily.   leuprolide (LUPRON) 30 MG injection Inject 30 mg into the muscle every 30 (thirty) days.   metFORMIN (GLUCOPHAGE) 500 MG tablet Take 1 tablet (500 mg total) by mouth every morning AND 2 tablets (1,000 mg total) every evening. Needs office visit.  mirabegron ER (MYRBETRIQ) 50 MG TB24 tablet Take 1 tablet (50 mg total) by mouth daily.   omeprazole (PRILOSEC) 20 MG capsule Take 1 capsule (20 mg total) by mouth daily.   Probiotic Product (ALIGN PO) Take 1 tablet by mouth daily.    psyllium (METAMUCIL) 58.6 % powder Take 1 packet by mouth 2 (two) times daily.   tretinoin (RETIN-A) 0.025 % cream Apply 1 application to the face Nightly.   triamcinolone cream (KENALOG) 0.1 % Apply 1 small Application topically 2 (two) times daily to affected area.   Vitamin D, Ergocalciferol, (DRISDOL) 1.25 MG (50000 UNIT) CAPS capsule Take 1 capsule by mouth every 10 days.    No current facility-administered medications for this visit. (Other)   REVIEW OF SYSTEMS: ROS   Positive for: Endocrine, Eyes Negative for: Constitutional, Gastrointestinal, Neurological, Skin, Genitourinary, Musculoskeletal, HENT, Cardiovascular, Respiratory, Psychiatric, Allergic/Imm, Heme/Lymph Last edited by Doreene Nest, COT on 03/05/2023  3:46 PM.     ALLERGIES Allergies  Allergen Reactions   Codeine Nausea And Vomiting   Hydrocodone-Acetaminophen Itching   Thimerosal (Thiomersal) Other (See Comments)    Eye redness    Ciprofloxacin Other (See Comments)    FATIGUE   Daypro [Oxaprozin] Rash   Levofloxacin Other (See Comments)    fatigue   Stadol [Butorphanol Tartrate] Other (See Comments)    ALTERED MENTAL STATUS   Sulfa Antibiotics Rash   PAST MEDICAL HISTORY Past Medical History:  Diagnosis Date   Anxiety    Arthritis    Cervical syndrome    CKD (chronic kidney disease), stage III (HCC)    Constipation    COVID-19 03/26/2019   Family history of colon cancer 02/15/2020   Fatty liver    GERD (gastroesophageal reflux disease)    Granulosa cell tumor of ovary    History of hiatal hernia    Hyperlipidemia    Joint pain    Neck pain    Obesity    Osteoarthritis    Ovarian cancer (HCC)    PONV (postoperative nausea and vomiting)    history of n/v,  past surgeries no n/v   Sleep apnea    uses CPAP   Tremor    Past Surgical History:  Procedure Laterality Date   ABDOMINAL HYSTERECTOMY     TAH/BSO   APPENDECTOMY     EXPLORATORY LAPAROTOMY     for bowel obstruction   IR IMAGING GUIDED PORT INSERTION  10/25/2020   IR REMOVAL TUN ACCESS W/ PORT W/O FL MOD SED  06/19/2021   left toe surgery Left    Secondary tumor debulking  2002   SHOULDER SURGERY     2017 left   Tumor debulking  2010   VENTRAL HERNIA REPAIR     FAMILY HISTORY Family History  Problem Relation Age of Onset   Basal cell carcinoma Mother        65s   Thyroid disease Mother    Stroke  Father    Colon cancer Father 74   Hypertension Father    Heart disease Father    Colon cancer Paternal Uncle        dx 72s   Colon cancer Cousin        maternal cousin; dx 39s   SOCIAL HISTORY Social History   Tobacco Use   Smoking status: Never   Smokeless tobacco: Never  Vaping Use   Vaping status: Never Used  Substance Use Topics   Alcohol use: Not Currently   Drug use: No  OPHTHALMIC EXAM:  Base Eye Exam     Visual Acuity (Snellen - Linear)       Right Left   Dist cc 20/50 20/30 +2   Dist ph cc NI 20/20 -2    Correction: Glasses         Tonometry (Tonopen, 3:58 PM)       Right Left   Pressure 22 24         Pupils       Dark Light Shape React APD   Right 8 8 Round None None   Left 8 8 Round None None         Visual Fields       Left Right    Full Full         Extraocular Movement       Right Left    Full, Ortho Full, Ortho         Neuro/Psych     Oriented x3: Yes   Mood/Affect: Normal         Dilation     Both eyes: 1.0% Mydriacyl, 2.5% Phenylephrine @ 3:59 PM           Slit Lamp and Fundus Exam     Slit Lamp Exam       Right Left   Lids/Lashes Dermatochalasis - upper lid Dermatochalasis - upper lid   Conjunctiva/Sclera White and quiet White and quiet   Cornea trace PEE 1+ fine PEE   Anterior Chamber deep and clear deep and clear   Iris Round and dilated Round and dilated   Lens 2+ Nuclear sclerosis, 2+ Cortical cataract 2+ Nuclear sclerosis, 2+ Cortical cataract   Anterior Vitreous mild syneresis, no pigment mild syneresis, Posterior vitreous detachment         Fundus Exam       Right Left   Disc Pink and Sharp Pink and Sharp, mild tilt   C/D Ratio 0.3 0.3   Macula Flat, Blunted foveal reflex, ERM with striae Flat, Blunted foveal reflex   Vessels attenuated, mild tortuosity attenuated, Tortuous   Periphery Bilobed HST at 1030 with shallow cuff of SRF anteriorly, light pigmented demarcation line  posteriorly Attached, No heme           Refraction     Wearing Rx       Sphere Cylinder Axis Add   Right -4.00 +0.50 170 +2.50   Left -4.00 +0.50 048 +2.50    Age: 8 yr         Manifest Refraction       Sphere Cylinder Axis Dist VA   Right -4.25 +0.50 135 20/50   Left -4.75 +0.75 060 20/25           IMAGING AND PROCEDURES  Imaging and Procedures for 03/05/2023  OCT, Retina - OU - Both Eyes       Right Eye Quality was good. Central Foveal Thickness: 408. Progression has no prior data. Findings include no IRF, abnormal foveal contour, epiretinal membrane, macular pucker, subretinal fluid (Mild ERM with central thickening, loss of foveal contour and pucker, focal retinal break with +IRF/SRF superior periphery caught on widefield, focal ellipsoid disruption centrally).   Left Eye Quality was good. Central Foveal Thickness: 322. Progression has no prior data. Findings include normal foveal contour, no IRF, no SRF.   Notes *Images captured and stored on drive  Diagnosis / Impression:  OD: Mild ERM with central thickening, loss of foveal contour and pucker; focal retinal break with +  IRF/SRF superior periphery caught on widefield OS: NFP, no IRF/SRF  Clinical management:  See below  Abbreviations: NFP - Normal foveal profile. CME - cystoid macular edema. PED - pigment epithelial detachment. IRF - intraretinal fluid. SRF - subretinal fluid. EZ - ellipsoid zone. ERM - epiretinal membrane. ORA - outer retinal atrophy. ORT - outer retinal tubulation. SRHM - subretinal hyper-reflective material. IRHM - intraretinal hyper-reflective material      Color Fundus Photography Optos - OU - Both Eyes       Right Eye Disc findings include normal observations. Macula : normal observations. Vessels : tortuous vessels, attenuated. Periphery : tear (Large bilobed HST at 1030 with +SRF).   Left Eye Disc findings include normal observations. Macula : normal observations. Vessels  : tortuous vessels, attenuated. Periphery : normal observations.   Notes **Images stored on drive**  Impression: OD: Large bilobed HST at 1030 with +SRF OS: normal study      Repair Retinal Detach, Photocoag - OD - Right Eye       LASER PROCEDURE NOTE  Procedure:  Barrier laser retinopexy using slit lamp laser, RIGHT eye   Diagnosis:   Retinal tear w/ +SRF / focal RD, RIGHT eye                     Bi-lobed lap tear at 1030 o'clock anterior to equator   Surgeon: Rennis Chris, MD, PhD  Anesthesia: Topical  Informed consent obtained, operative eye marked, and time out performed prior to initiation of laser.   Laser settings:  Lumenis Smart532 laser, slit lamp Lens: Mainster PRP 165 Power: 270 mW Spot size: 200 microns Duration: 30 msec  # spots: 422  Placement of laser: Using a Mainster PRP 165 contact lens at the slit lamp, laser was placed in three confluent rows around flap tear at 1030 oclock anterior to equator. The anterior quadrant of laser was completed via indirect ophthalmoscopy and scleral depression: 371 spots; 280 mW power, 70 ms duration  Complications: None.  Patient tolerated the procedure well and received written and verbal post-procedure care information/education.           ASSESSMENT/PLAN:   ICD-10-CM   1. Retinal tear of right eye  H33.311 Color Fundus Photography Optos - OU - Both Eyes    CANCELED: Repair Retinal Breaks, Laser - OD - Right Eye    2. Right retinal detachment  H33.21 Color Fundus Photography Optos - OU - Both Eyes    Repair Retinal Detach, Photocoag - OD - Right Eye    CANCELED: Repair Retinal Breaks, Laser - OD - Right Eye    3. Epiretinal membrane (ERM) of right eye  H35.371 OCT, Retina - OU - Both Eyes    4. Combined forms of age-related cataract of both eyes  H25.813      1,2. Retinal tear with +SRF / focal retinal detachment, OD - The incidence, risk factors, and natural history of retinal tear was discussed with  patient.   - Potential treatment options including laser retinopexy and cryotherapy discussed with patient. - bi-lobed retinal tear with +cuff of SRF anteriorly, light pigmented demarcation line posteriorly - recommend laser retinopexy today OD today, 11.13.24 - pt wishes to proceed with laser - RBA of procedure discussed, questions answered - informed consent obtained and signed - see procedure note - start PF QID OD x7 days - f/u in 1-2 wks - post laser check DFE, OCT  3. Epiretinal membrane, right eye  - The natural  history, anatomy, potential for loss of vision, and treatment options including vitrectomy techniques and the complications of endophthalmitis, retinal detachment, vitreous hemorrhage, cataract progression and permanent vision loss discussed with the patient. - Mild ERM with central thickening, loss of foveal contour and pucker  - BCVA 20/50 - asymptomatic, no metamorphopsia - monitor for now - f/u 3 mos -- DFE/OCT  4. Mixed Cataract OU - The symptoms of cataract, surgical options, and treatments and risks were discussed with patient. - discussed diagnosis and progression - monitor  Ophthalmic Meds Ordered this visit:  Meds ordered this encounter  Medications   prednisoLONE acetate (PRED FORTE) 1 % ophthalmic suspension    Sig: Place 1 drop into the right eye 4 (four) times daily for 7 days.    Dispense:  10 mL    Refill:  0     Return for f/u 1-2 weeks, HST OD, DFE, OCT.  There are no Patient Instructions on file for this visit.  Explained the diagnoses, plan, and follow up with the patient and they expressed understanding.  Patient expressed understanding of the importance of proper follow up care.   This document serves as a record of services personally performed by Karie Chimera, MD, PhD. It was created on their behalf by Glee Arvin. Manson Passey, OA an ophthalmic technician. The creation of this record is the provider's dictation and/or activities during the  visit.    Electronically signed by: Glee Arvin. Manson Passey, OA 03/06/23 12:02 AM  Karie Chimera, M.D., Ph.D. Diseases & Surgery of the Retina and Vitreous [facility] 03/05/2023  I have reviewed the above documentation for accuracy and completeness, and I agree with the above. Karie Chimera, M.D., Ph.D. 03/06/23 12:13 AM   Abbreviations: M myopia (nearsighted); A astigmatism; H hyperopia (farsighted); P presbyopia; Mrx spectacle prescription;  CTL contact lenses; OD right eye; OS left eye; OU both eyes  XT exotropia; ET esotropia; PEK punctate epithelial keratitis; PEE punctate epithelial erosions; DES dry eye syndrome; MGD meibomian gland dysfunction; ATs artificial tears; PFAT's preservative free artificial tears; NSC nuclear sclerotic cataract; PSC posterior subcapsular cataract; ERM epi-retinal membrane; PVD posterior vitreous detachment; RD retinal detachment; DM diabetes mellitus; DR diabetic retinopathy; NPDR non-proliferative diabetic retinopathy; PDR proliferative diabetic retinopathy; CSME clinically significant macular edema; DME diabetic macular edema; dbh dot blot hemorrhages; CWS cotton wool spot; POAG primary open angle glaucoma; C/D cup-to-disc ratio; HVF humphrey visual field; GVF goldmann visual field; OCT optical coherence tomography; IOP intraocular pressure; BRVO Branch retinal vein occlusion; CRVO central retinal vein occlusion; CRAO central retinal artery occlusion; BRAO branch retinal artery occlusion; RT retinal tear; SB scleral buckle; PPV pars plana vitrectomy; VH Vitreous hemorrhage; PRP panretinal laser photocoagulation; IVK intravitreal kenalog; VMT vitreomacular traction; MH Macular hole;  NVD neovascularization of the disc; NVE neovascularization elsewhere; AREDS age related eye disease study; ARMD age related macular degeneration; POAG primary open angle glaucoma; EBMD epithelial/anterior basement membrane dystrophy; ACIOL anterior chamber intraocular  lens; IOL intraocular lens; PCIOL posterior chamber intraocular lens; Phaco/IOL phacoemulsification with intraocular lens placement; PRK photorefractive keratectomy; LASIK laser assisted in situ keratomileusis; HTN hypertension; DM diabetes mellitus; COPD chronic obstructive pulmonary disease

## 2023-03-06 NOTE — Progress Notes (Addendum)
[facility] - Clinic Note  03/18/2023   CHIEF COMPLAINT Patient presents for Retina Follow Up  HISTORY OF PRESENT ILLNESS: [name] is a 67 y.o. female who presents to the clinic today for:  HPI     Retina Follow Up   Patient presents with  Retinal Break/Detachment.  In right eye.  Severity is moderate.  Duration of 1 week.  Since onset it is stable.  I, the attending physician,  performed the HPI with the patient and updated documentation appropriately.        Comments   Pt here for 1 wk f/u RD OD, s/p retinopexy 11.13.24. Pt states VA was improving but now she feels she has blind spot on right side of OD.       Last edited by Rennis Chris, MD on 03/18/2023 11:06 PM.    Pt states she still has a blind spot in her right eye  Referring physician: Diona Foley, MD [address] [place],  [place] [address]  HISTORICAL INFORMATION:  Selected notes from the MEDICAL RECORD NUMBER Referred by Dr. Zenaida Niece for HST LEE:  Ocular Hx- PMH-   CURRENT MEDICATIONS: Current Outpatient Medications (Ophthalmic Drugs)  Medication Sig   Polyethyl Glycol-Propyl Glycol (SYSTANE OP) Place 1 drop into both eyes daily as needed (dry eyes).   No current facility-administered medications for this visit. (Ophthalmic Drugs)   Current Outpatient Medications (Other)  Medication Sig   acetaminophen (TYLENOL) 500 MG tablet Take 1,000 mg by mouth every 6 (six) hours as needed for moderate pain.   anastrozole (ARIMIDEX) 1 MG tablet Take 1 tablet (1 mg total) by mouth daily.   aspirin EC 81 MG tablet Take 81 mg by mouth daily. Swallow whole.   Biotin 5 MG TABS Take 5 mg by mouth daily.   buPROPion (WELLBUTRIN SR) 200 MG 12 hr tablet Take 1 tablet (200 mg total) by mouth 2 (two) times daily.   escitalopram (LEXAPRO) 20 MG tablet Take 1 tablet (20 mg total) by mouth daily.   ezetimibe (ZETIA) 10 MG tablet Take 1 tablet (10 mg total) by mouth daily.   gabapentin (NEURONTIN) 300  MG capsule Take 1 capsule (300 mg total) by mouth 2 (two) times daily.   isosorbide mononitrate (IMDUR) 30 MG 24 hr tablet Take 30 mg by mouth daily.   leuprolide (LUPRON) 30 MG injection Inject 30 mg into the muscle every 30 (thirty) days.   metFORMIN (GLUCOPHAGE) 500 MG tablet Take 1 tablet (500 mg total) by mouth every morning AND 2 tablets (1,000 mg total) every evening. Needs office visit.   mirabegron ER (MYRBETRIQ) 50 MG TB24 tablet Take 1 tablet (50 mg total) by mouth daily.   omeprazole (PRILOSEC) 20 MG capsule Take 1 capsule (20 mg total) by mouth daily.   Probiotic Product (ALIGN PO) Take 1 tablet by mouth daily.    psyllium (METAMUCIL) 58.6 % powder Take 1 packet by mouth 2 (two) times daily.   tretinoin (RETIN-A) 0.025 % cream Apply 1 application to the face Nightly.   triamcinolone cream (KENALOG) 0.1 % Apply 1 small Application topically 2 (two) times daily to affected area.   Vitamin D, Ergocalciferol, (DRISDOL) 1.25 MG (50000 UNIT) CAPS capsule Take 1 capsule by mouth every 10 days.   atorvastatin (LIPITOR) 40 MG tablet Take 1 tablet (40 mg total) by mouth daily.   No current facility-administered medications for this visit. (Other)   REVIEW OF SYSTEMS: ROS   Positive for:  Endocrine, Eyes Negative for: Constitutional, Gastrointestinal, Neurological, Skin, Genitourinary, Musculoskeletal, HENT, Cardiovascular, Respiratory, Psychiatric, Allergic/Imm, Heme/Lymph Last edited by Thompson Grayer, COT on 03/18/2023  7:57 AM.     ALLERGIES Allergies  Allergen Reactions   Codeine Nausea And Vomiting   Hydrocodone-Acetaminophen Itching   Thimerosal (Thiomersal) Other (See Comments)    Eye redness    Ciprofloxacin Other (See Comments)    FATIGUE   Daypro [Oxaprozin] Rash   Levofloxacin Other (See Comments)    fatigue   Stadol [Butorphanol Tartrate] Other (See Comments)    ALTERED MENTAL STATUS   Sulfa Antibiotics Rash   PAST MEDICAL HISTORY Past Medical History:   Diagnosis Date   Anxiety    Arthritis    Cervical syndrome    CKD (chronic kidney disease), stage III (HCC)    Constipation    COVID-19 03/26/2019   Family history of colon cancer 02/15/2020   Fatty liver    GERD (gastroesophageal reflux disease)    Granulosa cell tumor of ovary    History of hiatal hernia    Hyperlipidemia    Joint pain    Neck pain    Obesity    Osteoarthritis    Ovarian cancer (HCC)    PONV (postoperative nausea and vomiting)    history of n/v,  past surgeries no n/v   Sleep apnea    uses CPAP   Tremor    Past Surgical History:  Procedure Laterality Date   ABDOMINAL HYSTERECTOMY     TAH/BSO   APPENDECTOMY     EXPLORATORY LAPAROTOMY     for bowel obstruction   IR IMAGING GUIDED PORT INSERTION  10/25/2020   IR REMOVAL TUN ACCESS W/ PORT W/O FL MOD SED  06/19/2021   left toe surgery Left    Secondary tumor debulking  2002   SHOULDER SURGERY     2017 left   Tumor debulking  2010   VENTRAL HERNIA REPAIR     FAMILY HISTORY Family History  Problem Relation Age of Onset   Basal cell carcinoma Mother        49s   Thyroid disease Mother    Stroke Father    Colon cancer Father 13   Hypertension Father    Heart disease Father    Colon cancer Paternal Uncle        dx 21s   Colon cancer Cousin        maternal cousin; dx 78s   SOCIAL HISTORY Social History   Tobacco Use   Smoking status: Never   Smokeless tobacco: Never  Vaping Use   Vaping status: Never Used  Substance Use Topics   Alcohol use: Not Currently   Drug use: No       OPHTHALMIC EXAM:  Base Eye Exam     Visual Acuity (Snellen - Linear)       Right Left   Dist cc 20/40 +2 20/20 -2   Dist ph cc NI     Correction: Glasses         Tonometry (Tonopen, 8:01 AM)       Right Left   Pressure 18 21         Pupils       Pupils Dark Light Shape React APD   Right PERRL 4 3 Round Brisk None   Left PERRL 4 3 Round Brisk None         Visual Fields       Left Right     Full  Full         Extraocular Movement       Right Left    Full, Ortho Full, Ortho         Neuro/Psych     Oriented x3: Yes   Mood/Affect: Normal         Dilation     Both eyes: 1.0% Mydriacyl, 2.5% Phenylephrine @ 8:02 AM           Slit Lamp and Fundus Exam     Slit Lamp Exam       Right Left   Lids/Lashes Dermatochalasis - upper lid Dermatochalasis - upper lid   Conjunctiva/Sclera White and quiet White and quiet   Cornea trace PEE, trace tear film debris 1+ fine PEE   Anterior Chamber deep and clear deep and clear   Iris Round and dilated Round and dilated   Lens 2+ Nuclear sclerosis, 2+ Cortical cataract 2+ Nuclear sclerosis, 2+ Cortical cataract   Anterior Vitreous mild syneresis, no pigment, Posterior vitreous detachment, vitreous condensations mild syneresis, Posterior vitreous detachment         Fundus Exam       Right Left   Disc Pink and Sharp Pink and Sharp, mild tilt   C/D Ratio 0.4 0.3   Macula Flat, Blunted foveal reflex, ERM with striae Flat, Blunted foveal reflex   Vessels attenuated, Tortuous, copper wiring attenuated, Tortuous   Periphery HST at 1030 with shallow cuff of SRF anteriorly, light pigmented demarcation line posteriorly -- good laser changes Attached, No heme           Refraction     Wearing Rx       Sphere Cylinder Axis Add   Right -4.00 +0.50 170 +2.50   Left -4.00 +0.50 048 +2.50           IMAGING AND PROCEDURES  Imaging and Procedures for 03/18/2023  OCT, Retina - OU - Both Eyes       Right Eye Quality was good. Central Foveal Thickness: 421. Progression has been stable. Findings include no IRF, abnormal foveal contour, epiretinal membrane, macular pucker, subretinal fluid (Trace vitreous opacities, Mild ERM with central thickening, loss of foveal contour and pucker, focal retinal break with +IRF/SRF superior periphery caught on widefield, focal ellipsoid disruption centrally).   Left Eye Quality  was good. Central Foveal Thickness: 321. Progression has been stable. Findings include normal foveal contour, no IRF, no SRF.   Notes *Images captured and stored on drive  Diagnosis / Impression:  OD: trace vitreous opacities, mild ERM with central thickening, loss of foveal contour and pucker; focal retinal break with +IRF/SRF superior periphery caught on widefield OS: NFP, no IRF/SRF  Clinical management:  See below  Abbreviations: NFP - Normal foveal profile. CME - cystoid macular edema. PED - pigment epithelial detachment. IRF - intraretinal fluid. SRF - subretinal fluid. EZ - ellipsoid zone. ERM - epiretinal membrane. ORA - outer retinal atrophy. ORT - outer retinal tubulation. SRHM - subretinal hyper-reflective material. IRHM - intraretinal hyper-reflective material           ASSESSMENT/PLAN:   ICD-10-CM   1. Retinal tear of right eye  H33.311 OCT, Retina - OU - Both Eyes    2. Right retinal detachment  H33.21     3. Epiretinal membrane (ERM) of right eye  H35.371     4. Combined forms of age-related cataract of both eyes  H25.813      1,2. Retinal tear with +SRF / focal retinal  detachment, OD - bi-lobed retinal tear with +cuff of SRF anteriorly, light pigmented demarcation line posteriorly - s/p laser retinopexy OD (11.13.24) -- good laser changes surrounding - completed PF QID OD x7 days - no new RT/RD - f/u in 3 weeks, DFE, OCT  3. Epiretinal membrane, right eye  - Mild ERM with central thickening, loss of foveal contour and pucker  - BCVA 20/50 - asymptomatic, no metamorphopsia - monitor for now - f/u 3 mos -- DFE/OCT  4. Mixed Cataract OU - The symptoms of cataract, surgical options, and treatments and risks were discussed with patient. - discussed diagnosis and progression - monitor  Ophthalmic Meds Ordered this visit:  No orders of the defined types were placed in this encounter.    Return in about 3 weeks (around 04/08/2023) for f/u retinal tear OD,  DFE, OCT.  There are no Patient Instructions on file for this visit.  Explained the diagnoses, plan, and follow up with the patient and they expressed understanding.  Patient expressed understanding of the importance of proper follow up care.   This document serves as a record of services personally performed by Karie Chimera, MD, PhD. It was created on their behalf by Charlette Caffey, COT an ophthalmic technician. The creation of this record is the provider's dictation and/or activities during the visit.    Electronically signed by:  Charlette Caffey, COT  03/18/23 11:08 PM  This document serves as a record of services personally performed by Karie Chimera, MD, PhD. It was created on their behalf by Glee Arvin. Manson Passey, OA an ophthalmic technician. The creation of this record is the provider's dictation and/or activities during the visit.    Electronically signed by: Glee Arvin. Manson Passey, OA 03/18/23 11:08 PM  Karie Chimera, M.D., Ph.D. Diseases & Surgery of the Retina and Vitreous [facility] 03/18/2023  I have reviewed the above documentation for accuracy and completeness, and I agree with the above. Karie Chimera, M.D., Ph.D. 03/18/23 11:08 PM  Abbreviations: M myopia (nearsighted); A astigmatism; H hyperopia (farsighted); P presbyopia; Mrx spectacle prescription;  CTL contact lenses; OD right eye; OS left eye; OU both eyes  XT exotropia; ET esotropia; PEK punctate epithelial keratitis; PEE punctate epithelial erosions; DES dry eye syndrome; MGD meibomian gland dysfunction; ATs artificial tears; PFAT's preservative free artificial tears; NSC nuclear sclerotic cataract; PSC posterior subcapsular cataract; ERM epi-retinal membrane; PVD posterior vitreous detachment; RD retinal detachment; DM diabetes mellitus; DR diabetic retinopathy; NPDR non-proliferative diabetic retinopathy; PDR proliferative diabetic retinopathy; CSME clinically significant macular edema; DME  diabetic macular edema; dbh dot blot hemorrhages; CWS cotton wool spot; POAG primary open angle glaucoma; C/D cup-to-disc ratio; HVF humphrey visual field; GVF goldmann visual field; OCT optical coherence tomography; IOP intraocular pressure; BRVO Branch retinal vein occlusion; CRVO central retinal vein occlusion; CRAO central retinal artery occlusion; BRAO branch retinal artery occlusion; RT retinal tear; SB scleral buckle; PPV pars plana vitrectomy; VH Vitreous hemorrhage; PRP panretinal laser photocoagulation; IVK intravitreal kenalog; VMT vitreomacular traction; MH Macular hole;  NVD neovascularization of the disc; NVE neovascularization elsewhere; AREDS age related eye disease study; ARMD age related macular degeneration; POAG primary open angle glaucoma; EBMD epithelial/anterior basement membrane dystrophy; ACIOL anterior chamber intraocular lens; IOL intraocular lens; PCIOL posterior chamber intraocular lens; Phaco/IOL phacoemulsification with intraocular lens placement; PRK photorefractive keratectomy; LASIK laser assisted in situ keratomileusis; HTN hypertension; DM diabetes mellitus; COPD chronic obstructive pulmonary disease

## 2023-03-10 ENCOUNTER — Ambulatory Visit (INDEPENDENT_AMBULATORY_CARE_PROVIDER_SITE_OTHER): Payer: 59 | Admitting: Ophthalmology

## 2023-03-10 ENCOUNTER — Encounter (INDEPENDENT_AMBULATORY_CARE_PROVIDER_SITE_OTHER): Payer: Self-pay | Admitting: Ophthalmology

## 2023-03-10 DIAGNOSIS — H35371 Puckering of macula, right eye: Secondary | ICD-10-CM

## 2023-03-10 DIAGNOSIS — H3321 Serous retinal detachment, right eye: Secondary | ICD-10-CM

## 2023-03-10 DIAGNOSIS — H25813 Combined forms of age-related cataract, bilateral: Secondary | ICD-10-CM

## 2023-03-10 DIAGNOSIS — H33311 Horseshoe tear of retina without detachment, right eye: Secondary | ICD-10-CM

## 2023-03-10 NOTE — Progress Notes (Signed)
[facility] - Clinic Note  03/10/2023   CHIEF COMPLAINT Patient presents for Retina Follow Up and Retina Evaluation  HISTORY OF PRESENT ILLNESS: [name] is a 67 y.o. female who presents to the clinic today for:  HPI     Retina Follow Up   Patient presents with  Other.  In right eye.  This started 6 hours ago.  I, the attending physician,  performed the HPI with the patient and updated documentation appropriately.        Retina Evaluation   In right eye.  This started 6 hours ago.  Duration of 6 hours.  Associated Symptoms Floaters.  I, the attending physician,  performed the HPI with the patient and updated documentation appropriately.        Comments   Patient here for Retina Evaluation. Patient states vision is ok. There is like a veil or something in OD. Like piece of fabric or hair. Started this an when got up. No eye pain. Just aches.       Last edited by Rennis Chris, MD on 03/10/2023  1:29 PM.    Pt presents acutely today complaining of a "hair" or "thread" in her right eye that she noticed this morning, she had laser retinopexy 5 days ago, she denies any eye pain, she has been using PF as directed   Referring physician: Diona Foley, MD [address] [place],  [place] [address]  HISTORICAL INFORMATION:  Selected notes from the MEDICAL RECORD NUMBER Referred by Dr. Zenaida Niece for HST LEE:  Ocular Hx- PMH-   CURRENT MEDICATIONS: Current Outpatient Medications (Ophthalmic Drugs)  Medication Sig   Polyethyl Glycol-Propyl Glycol (SYSTANE OP) Place 1 drop into both eyes daily as needed (dry eyes).   prednisoLONE acetate (PRED FORTE) 1 % ophthalmic suspension Place 1 drop into the right eye 4 (four) times daily for 7 days.   No current facility-administered medications for this visit. (Ophthalmic Drugs)   Current Outpatient Medications (Other)  Medication Sig   acetaminophen (TYLENOL) 500 MG tablet Take 1,000 mg by mouth every 6 (six)  hours as needed for moderate pain.   anastrozole (ARIMIDEX) 1 MG tablet Take 1 tablet (1 mg total) by mouth daily.   aspirin EC 81 MG tablet Take 81 mg by mouth daily. Swallow whole.   atorvastatin (LIPITOR) 40 MG tablet Take 1 tablet (40 mg total) by mouth daily.   Biotin 5 MG TABS Take 5 mg by mouth daily.   buPROPion (WELLBUTRIN SR) 200 MG 12 hr tablet Take 1 tablet (200 mg total) by mouth 2 (two) times daily.   escitalopram (LEXAPRO) 20 MG tablet Take 1 tablet (20 mg total) by mouth daily.   ezetimibe (ZETIA) 10 MG tablet Take 1 tablet (10 mg total) by mouth daily.   gabapentin (NEURONTIN) 300 MG capsule Take 1 capsule (300 mg total) by mouth 2 (two) times daily.   isosorbide mononitrate (IMDUR) 30 MG 24 hr tablet Take 30 mg by mouth daily.   leuprolide (LUPRON) 30 MG injection Inject 30 mg into the muscle every 30 (thirty) days.   metFORMIN (GLUCOPHAGE) 500 MG tablet Take 1 tablet (500 mg total) by mouth every morning AND 2 tablets (1,000 mg total) every evening. Needs office visit.   mirabegron ER (MYRBETRIQ) 50 MG TB24 tablet Take 1 tablet (50 mg total) by mouth daily.   omeprazole (PRILOSEC) 20 MG capsule Take 1 capsule (20 mg total) by mouth daily.   Probiotic Product (ALIGN  PO) Take 1 tablet by mouth daily.    psyllium (METAMUCIL) 58.6 % powder Take 1 packet by mouth 2 (two) times daily.   tretinoin (RETIN-A) 0.025 % cream Apply 1 application to the face Nightly.   triamcinolone cream (KENALOG) 0.1 % Apply 1 small Application topically 2 (two) times daily to affected area.   Vitamin D, Ergocalciferol, (DRISDOL) 1.25 MG (50000 UNIT) CAPS capsule Take 1 capsule by mouth every 10 days.   No current facility-administered medications for this visit. (Other)   REVIEW OF SYSTEMS: ROS   Positive for: Endocrine, Eyes Negative for: Constitutional, Gastrointestinal, Neurological, Skin, Genitourinary, Musculoskeletal, HENT, Cardiovascular, Respiratory, Psychiatric, Allergic/Imm,  Heme/Lymph Last edited by Laddie Aquas, COA on 03/10/2023  1:03 PM.     ALLERGIES Allergies  Allergen Reactions   Codeine Nausea And Vomiting   Hydrocodone-Acetaminophen Itching   Thimerosal (Thiomersal) Other (See Comments)    Eye redness    Ciprofloxacin Other (See Comments)    FATIGUE   Daypro [Oxaprozin] Rash   Levofloxacin Other (See Comments)    fatigue   Stadol [Butorphanol Tartrate] Other (See Comments)    ALTERED MENTAL STATUS   Sulfa Antibiotics Rash   PAST MEDICAL HISTORY Past Medical History:  Diagnosis Date   Anxiety    Arthritis    Cervical syndrome    CKD (chronic kidney disease), stage III (HCC)    Constipation    COVID-19 03/26/2019   Family history of colon cancer 02/15/2020   Fatty liver    GERD (gastroesophageal reflux disease)    Granulosa cell tumor of ovary    History of hiatal hernia    Hyperlipidemia    Joint pain    Neck pain    Obesity    Osteoarthritis    Ovarian cancer (HCC)    PONV (postoperative nausea and vomiting)    history of n/v,  past surgeries no n/v   Sleep apnea    uses CPAP   Tremor    Past Surgical History:  Procedure Laterality Date   ABDOMINAL HYSTERECTOMY     TAH/BSO   APPENDECTOMY     EXPLORATORY LAPAROTOMY     for bowel obstruction   IR IMAGING GUIDED PORT INSERTION  10/25/2020   IR REMOVAL TUN ACCESS W/ PORT W/O FL MOD SED  06/19/2021   left toe surgery Left    Secondary tumor debulking  2002   SHOULDER SURGERY     2017 left   Tumor debulking  2010   VENTRAL HERNIA REPAIR     FAMILY HISTORY Family History  Problem Relation Age of Onset   Basal cell carcinoma Mother        61s   Thyroid disease Mother    Stroke Father    Colon cancer Father 83   Hypertension Father    Heart disease Father    Colon cancer Paternal Uncle        dx 57s   Colon cancer Cousin        maternal cousin; dx 51s   SOCIAL HISTORY Social History   Tobacco Use   Smoking status: Never   Smokeless tobacco: Never   Vaping Use   Vaping status: Never Used  Substance Use Topics   Alcohol use: Not Currently   Drug use: No       OPHTHALMIC EXAM:  Base Eye Exam     Visual Acuity (Snellen - Linear)       Right Left   Dist cc 20/40 -1 20/20  Dist ph cc NI     Correction: Glasses         Tonometry (Tonopen, 1:01 PM)       Right Left   Pressure 18 20         Pupils       Dark Light Shape React APD   Right 4 3 Round Brisk None   Left 4 3 Round Brisk None         Visual Fields (Counting fingers)       Left Right    Full Full         Extraocular Movement       Right Left    Full, Ortho Full, Ortho         Neuro/Psych     Oriented x3: Yes   Mood/Affect: Normal         Dilation     Both eyes: 1.0% Mydriacyl, 2.5% Phenylephrine @ 1:00 PM           Slit Lamp and Fundus Exam     Slit Lamp Exam       Right Left   Lids/Lashes Dermatochalasis - upper lid Dermatochalasis - upper lid   Conjunctiva/Sclera White and quiet White and quiet   Cornea trace PEE, trace tear film debris 1+ fine PEE   Anterior Chamber deep and clear deep and clear   Iris Round and dilated Round and dilated   Lens 2+ Nuclear sclerosis, 2+ Cortical cataract 2+ Nuclear sclerosis, 2+ Cortical cataract   Anterior Vitreous mild syneresis, no pigment, Posterior vitreous detachment, vitreous condensations mild syneresis, Posterior vitreous detachment         Fundus Exam       Right Left   Disc Pink and Sharp Pink and Sharp, mild tilt   C/D Ratio 0.3 0.3   Macula Flat, Blunted foveal reflex, ERM with striae Flat, Blunted foveal reflex   Vessels attenuated, mild tortuosity attenuated, Tortuous   Periphery HST at 1030 with shallow cuff of SRF anteriorly, light pigmented demarcation line posteriorly -- good early laser changes with focal IRH surrounding Attached, No heme           Refraction     Wearing Rx       Sphere Cylinder Axis Add   Right -4.00 +0.50 170 +2.50   Left  -4.00 +0.50 048 +2.50           IMAGING AND PROCEDURES  Imaging and Procedures for 03/10/2023  OCT, Retina - OU - Both Eyes       Right Eye Quality was good. Central Foveal Thickness: 413. Progression has been stable. Findings include no IRF, abnormal foveal contour, epiretinal membrane, macular pucker, subretinal fluid (Trace vitreous opacities, Mild ERM with central thickening, loss of foveal contour and pucker, focal retinal break with +IRF/SRF superior periphery caught on widefield, focal ellipsoid disruption centrally).   Left Eye Quality was good. Central Foveal Thickness: 317. Progression has been stable. Findings include normal foveal contour, no IRF, no SRF.   Notes *Images captured and stored on drive  Diagnosis / Impression:  OD: trace vitreous opacities, mild ERM with central thickening, loss of foveal contour and pucker; focal retinal break with +IRF/SRF superior periphery caught on widefield OS: NFP, no IRF/SRF  Clinical management:  See below  Abbreviations: NFP - Normal foveal profile. CME - cystoid macular edema. PED - pigment epithelial detachment. IRF - intraretinal fluid. SRF - subretinal fluid. EZ - ellipsoid zone. ERM - epiretinal membrane. ORA -  outer retinal atrophy. ORT - outer retinal tubulation. SRHM - subretinal hyper-reflective material. IRHM - intraretinal hyper-reflective material           ASSESSMENT/PLAN:   ICD-10-CM   1. Retinal tear of right eye  H33.311     2. Right retinal detachment  H33.21     3. Epiretinal membrane (ERM) of right eye  H35.371 OCT, Retina - OU - Both Eyes    4. Combined forms of age-related cataract of both eyes  H25.813      1,2. Retinal tear with +SRF / focal retinal detachment, OD  - pt presents for acute visit due to new hair-like floater / veil OD - bi-lobed retinal tear with +cuff of SRF anteriorly, light pigmented demarcation line posteriorly - s/p laser retinopexy OD (11.13.24) -- good early laser  changes - exam shows scattered vit condensations and +IRH around tear associated with laser changes - continue PF QID OD x2 more days - f/u as scheduled (10.26.24) - post laser check DFE, OCT  3. Epiretinal membrane, right eye  - Mild ERM with central thickening, loss of foveal contour and pucker  - BCVA 20/40 -- improved - asymptomatic, no metamorphopsia - monitor for now - f/u 3 mos -- DFE/OCT  4. Mixed Cataract OU - The symptoms of cataract, surgical options, and treatments and risks were discussed with patient. - discussed diagnosis and progression - monitor  Ophthalmic Meds Ordered this visit:  No orders of the defined types were placed in this encounter.    Return for f/u as scheduled.  There are no Patient Instructions on file for this visit.  Explained the diagnoses, plan, and follow up with the patient and they expressed understanding.  Patient expressed understanding of the importance of proper follow up care.   This document serves as a record of services personally performed by Karie Chimera, MD, PhD. It was created on their behalf by Glee Arvin. Manson Passey, OA an ophthalmic technician. The creation of this record is the provider's dictation and/or activities during the visit.    Electronically signed by: Glee Arvin. Manson Passey, OA 03/10/23 1:30 PM  Karie Chimera, M.D., Ph.D. Diseases & Surgery of the Retina and Vitreous [facility] 03/10/2023  I have reviewed the above documentation for accuracy and completeness, and I agree with the above. Karie Chimera, M.D., Ph.D. 03/10/23 1:33 PM   Abbreviations: M myopia (nearsighted); A astigmatism; H hyperopia (farsighted); P presbyopia; Mrx spectacle prescription;  CTL contact lenses; OD right eye; OS left eye; OU both eyes  XT exotropia; ET esotropia; PEK punctate epithelial keratitis; PEE punctate epithelial erosions; DES dry eye syndrome; MGD meibomian gland dysfunction; ATs artificial tears; PFAT's  preservative free artificial tears; NSC nuclear sclerotic cataract; PSC posterior subcapsular cataract; ERM epi-retinal membrane; PVD posterior vitreous detachment; RD retinal detachment; DM diabetes mellitus; DR diabetic retinopathy; NPDR non-proliferative diabetic retinopathy; PDR proliferative diabetic retinopathy; CSME clinically significant macular edema; DME diabetic macular edema; dbh dot blot hemorrhages; CWS cotton wool spot; POAG primary open angle glaucoma; C/D cup-to-disc ratio; HVF humphrey visual field; GVF goldmann visual field; OCT optical coherence tomography; IOP intraocular pressure; BRVO Branch retinal vein occlusion; CRVO central retinal vein occlusion; CRAO central retinal artery occlusion; BRAO branch retinal artery occlusion; RT retinal tear; SB scleral buckle; PPV pars plana vitrectomy; VH Vitreous hemorrhage; PRP panretinal laser photocoagulation; IVK intravitreal kenalog; VMT vitreomacular traction; MH Macular hole;  NVD neovascularization of the disc; NVE neovascularization elsewhere; AREDS age related eye  disease study; ARMD age related macular degeneration; POAG primary open angle glaucoma; EBMD epithelial/anterior basement membrane dystrophy; ACIOL anterior chamber intraocular lens; IOL intraocular lens; PCIOL posterior chamber intraocular lens; Phaco/IOL phacoemulsification with intraocular lens placement; PRK photorefractive keratectomy; LASIK laser assisted in situ keratomileusis; HTN hypertension; DM diabetes mellitus; COPD chronic obstructive pulmonary disease

## 2023-03-18 ENCOUNTER — Encounter (INDEPENDENT_AMBULATORY_CARE_PROVIDER_SITE_OTHER): Payer: Self-pay | Admitting: Ophthalmology

## 2023-03-18 ENCOUNTER — Ambulatory Visit (INDEPENDENT_AMBULATORY_CARE_PROVIDER_SITE_OTHER): Payer: 59 | Admitting: Ophthalmology

## 2023-03-18 DIAGNOSIS — H3321 Serous retinal detachment, right eye: Secondary | ICD-10-CM

## 2023-03-18 DIAGNOSIS — H35371 Puckering of macula, right eye: Secondary | ICD-10-CM

## 2023-03-18 DIAGNOSIS — H33311 Horseshoe tear of retina without detachment, right eye: Secondary | ICD-10-CM

## 2023-03-18 DIAGNOSIS — H25813 Combined forms of age-related cataract, bilateral: Secondary | ICD-10-CM

## 2023-03-19 ENCOUNTER — Other Ambulatory Visit (HOSPITAL_COMMUNITY): Payer: Self-pay

## 2023-03-19 ENCOUNTER — Other Ambulatory Visit: Payer: 59

## 2023-03-19 ENCOUNTER — Ambulatory Visit (HOSPITAL_COMMUNITY)
Admission: RE | Admit: 2023-03-19 | Discharge: 2023-03-19 | Disposition: A | Discharge status: Home / Self Care | Payer: 59 | Source: Ambulatory Visit | Attending: Hematology and Oncology | Admitting: Hematology and Oncology

## 2023-03-19 ENCOUNTER — Ambulatory Visit (INDEPENDENT_AMBULATORY_CARE_PROVIDER_SITE_OTHER): Payer: 59 | Admitting: Physician Assistant

## 2023-03-19 ENCOUNTER — Encounter (INDEPENDENT_AMBULATORY_CARE_PROVIDER_SITE_OTHER): Payer: Self-pay | Admitting: Physician Assistant

## 2023-03-19 VITALS — BP 120/68 | HR 50 | Temp 97.9°F | Ht 66.0 in | Wt 190.0 lb

## 2023-03-19 DIAGNOSIS — R232 Flushing: Secondary | ICD-10-CM | POA: Diagnosis not present

## 2023-03-19 DIAGNOSIS — C569 Malignant neoplasm of unspecified ovary: Secondary | ICD-10-CM | POA: Insufficient documentation

## 2023-03-19 DIAGNOSIS — F3289 Other specified depressive episodes: Secondary | ICD-10-CM

## 2023-03-19 DIAGNOSIS — Z6830 Body mass index (BMI) 30.0-30.9, adult: Secondary | ICD-10-CM

## 2023-03-19 DIAGNOSIS — R7303 Prediabetes: Secondary | ICD-10-CM | POA: Diagnosis not present

## 2023-03-19 DIAGNOSIS — N951 Menopausal and female climacteric states: Secondary | ICD-10-CM | POA: Diagnosis not present

## 2023-03-19 DIAGNOSIS — E559 Vitamin D deficiency, unspecified: Secondary | ICD-10-CM | POA: Diagnosis not present

## 2023-03-19 DIAGNOSIS — C786 Secondary malignant neoplasm of retroperitoneum and peritoneum: Secondary | ICD-10-CM | POA: Diagnosis not present

## 2023-03-19 DIAGNOSIS — E669 Obesity, unspecified: Secondary | ICD-10-CM

## 2023-03-19 DIAGNOSIS — E7849 Other hyperlipidemia: Secondary | ICD-10-CM

## 2023-03-19 DIAGNOSIS — Z90710 Acquired absence of both cervix and uterus: Secondary | ICD-10-CM | POA: Diagnosis not present

## 2023-03-19 LAB — COMPREHENSIVE METABOLIC PANEL
ALT: 14 U/L (ref 0–44)
AST: 17 U/L (ref 15–41)
Albumin: 4.1 g/dL (ref 3.5–5.0)
Alkaline Phosphatase: 91 U/L (ref 38–126)
Anion gap: 6 (ref 5–15)
BUN: 20 mg/dL (ref 8–23)
CO2: 28 mmol/L (ref 22–32)
Calcium: 9.6 mg/dL (ref 8.9–10.3)
Chloride: 101 mmol/L (ref 98–111)
Creatinine, Ser: 1.16 mg/dL — ABNORMAL HIGH (ref 0.44–1.00)
GFR, Estimated: 52 mL/min — ABNORMAL LOW (ref >60)
Glucose, Bld: 86 mg/dL (ref 70–99)
Potassium: 4.1 mmol/L (ref 3.5–5.1)
Sodium: 135 mmol/L (ref 135–145)
Total Bilirubin: 0.5 mg/dL (ref <1.2)
Total Protein: 7.1 g/dL (ref 6.5–8.1)

## 2023-03-19 LAB — CBC WITH DIFFERENTIAL/PLATELET
Abs Immature Granulocytes: 0.01 10*3/uL (ref 0.00–0.07)
Basophils Absolute: 0.1 10*3/uL (ref 0.0–0.1)
Basophils Relative: 1 %
Eosinophils Absolute: 0.2 10*3/uL (ref 0.0–0.5)
Eosinophils Relative: 3 %
HCT: 41 % (ref 36.0–46.0)
Hemoglobin: 13.6 g/dL (ref 12.0–15.0)
Immature Granulocytes: 0 %
Lymphocytes Relative: 29 %
Lymphs Abs: 1.7 10*3/uL (ref 0.7–4.0)
MCH: 28.7 pg (ref 26.0–34.0)
MCHC: 33.2 g/dL (ref 30.0–36.0)
MCV: 86.5 fL (ref 80.0–100.0)
Monocytes Absolute: 0.6 10*3/uL (ref 0.1–1.0)
Monocytes Relative: 10 %
Neutro Abs: 3.4 10*3/uL (ref 1.7–7.7)
Neutrophils Relative %: 57 %
Platelets: 293 10*3/uL (ref 150–400)
RBC: 4.74 MIL/uL (ref 3.87–5.11)
RDW: 13.1 % (ref 11.5–15.5)
WBC: 6 10*3/uL (ref 4.0–10.5)
nRBC: 0 % (ref 0.0–0.2)

## 2023-03-19 MED ORDER — IOHEXOL 300 MG/ML  SOLN
100.0000 mL | Freq: Once | INTRAMUSCULAR | Status: AC | PRN
Start: 2023-03-19 — End: 2023-03-19
  Administered 2023-03-19: 100 mL via INTRAVENOUS

## 2023-03-19 MED ORDER — VITAMIN D (ERGOCALCIFEROL) 1.25 MG (50000 UNIT) PO CAPS
50000.0000 [IU] | ORAL_CAPSULE | ORAL | 1 refills | Status: DC
  Filled 2023-03-19: qty 9, 90d supply, fill #0

## 2023-03-19 MED ORDER — IOHEXOL 300 MG/ML  SOLN
30.0000 mL | Freq: Once | INTRAMUSCULAR | Status: AC | PRN
  Administered 2023-03-19: 30 mL via ORAL

## 2023-03-19 NOTE — Progress Notes (Signed)
SUBJECTIVE:  Chief Complaint: Obesity Discussed the use of AI scribe software for clinical note transcription with the patient, who gave verbal consent to proceed.  History of Present Illness     Interim History: [name] is a 67 yo female with a history of high blood pressure and diabetes, presented for follow up of her obesity treatment plan.   Recently found she had a retinal tear which she had to have surgery to address.  The patient reported experiencing black spots in his vision for approximately a month and a half prior to the examination.. They are not sure how the retinal tear occurred and denied any strenuous activities. Her activity level is limited due to her retinal surgery and will be until she has been cleared by the Ophthalmologist for increased activity and return to usual swimming. She is allowed to walk and we discussed increasing her walking/using walking with [name] you tube video if bad weather .  Post-diagnosis, she underwent laser surgery and has been attending follow-up appointments every two weeks. Reports persistent blind spot in her vision, which the ophthalmologist attributed to debris from the laser surgery.   In addition to the retinal tear, the patient has been managing her weight and diabetes. She reported a decrease in exercise due to the retinal tear, limiting her activities to walking. The patient also reported a change in her medication regimen, reducing her metformin to 500 mg twice daily instead of the higher doses due to GI upset  and occasionally skipping doses of [name].  She expressed a desire to return to swimming and other exercises, but has been advised against it due to the retinal tear. The patient also expressed a willingness to improve her nutrition and medication adherence to better manage her pre- diabetes and weight.  [name] is here to discuss her progress with her obesity treatment plan. She is on the Category 2 Plan and practicing portion  control and making smarter food choices, such as increasing vegetables and decreasing simple carbohydrates and states she is following her eating plan approximately 60 % of the time. She states she is exercising 60 minutes 1-2 times per week.   OBJECTIVE: Visit Diagnoses: Problem List Items Addressed This Visit     Vitamin D deficiency   Relevant Medications   Vitamin D, Ergocalciferol, ([name]) 1.25 MG (50000 UNIT) CAPS capsule   Prediabetes - Primary   Other hyperlipidemia, mixed   Depression   Generalized obesity- Start BMI 33.73  Retinal Tear Retinal tear diagnosed a couple of weeks ago during an annual eye exam. Reports seeing black spots for 1.5 months prior. Laser surgery performed with follow-up every two weeks. Persistent blind spot likely due to laser surgery debris. No hypertension or diabetes. Advised to avoid strenuous activities and swimming, and to sleep with head elevated to aid recovery. - Follow-up with ophthalmologist every two weeks as directed.  - Avoid strenuous activities and swimming - Sleep with head elevated  Prediabetes; Lab Results  Component Value Date   HGBA1C 5.7 (H) 10/03/2022   HGBA1C 5.5 02/12/2022   HGBA1C 5.4 07/09/2021   Lab Results  Component Value Date   LDLCALC 89 01/07/2023   CREATININE 1.16 (H) 03/19/2023    Reduced metformin to 500 mg twice daily due to gastrointestinal side effects, with improved tolerance. Advised to walk within 20-30 minutes after meals to manage blood glucose and reduce insulin resistance. - Continue/decrease metformin 500 mg twice daily. No refills needed this visit.  - Encourage walking within  20-30 minutes after meals - Monitor blood glucose levels regularly   Vitamin D Deficiency Vitamin D is at goal of 50.  Most recent vitamin D level was 75.6. She is on Ergocalciferol 50,000 units every 10 days now . No N/V or muscle weakness with Ergocalciferol. Lab Results  Component Value Date   VD25OH 75.6 10/03/2022    VD25OH 81.7 02/12/2022   VD25OH 38.6 07/09/2021    Plan: Continue [name] Ergocalciferol 50,000 units every 10 days .  Low vitamin D levels can be associated with adiposity and may result in leptin resistance and weight gain. Also associated with fatigue. Currently on vitamin D supplementation without any adverse effects.  Recheck vitamin D level early next year to optimize supplementation/avoid over supplementation.    Emotional eating behaviors: currently on [name]. Reports skipping doses, potentially contributing to increased cravings. Discussed impact of missed doses on cravings and mood stability. - Continue [name] SR 200 mg twice daily as prescribed. No refill needed this visit.  - Discuss impact of missed doses on cravings and mood  General Health Maintenance Advised on diet, exercise, and portion control. Emphasized protein intake and portion control during meals. Recommended walking within 20-30 minutes after meals to prevent blood sugar spikes. Suggested journaling food intake to monitor calorie and protein consumption. Refilled Vitamin D prescription. - Encourage protein intake and portion control during meals - Recommend walking within 20-30 minutes after meals - Suggest journaling food intake - Refill Vitamin D prescription  Follow-up - Schedule follow-up appointment for February 13th at 4 PM - Plan for fasting labs in February - Adjust treatment plans based on follow-up results.  Vitals Temp: 97.9 F (36.6 C) BP: 120/68 Pulse Rate: (!) 50 SpO2: 99 %   Anthropometric Measurements Height: 5\' 6"  (1.676 m) Weight: 190 lb (86.2 kg) BMI (Calculated): 30.68 Weight at Last Visit: 187 lb Weight Lost Since Last Visit: 0 Weight Gained Since Last Visit: 3 lb Starting Weight: 209 lb Total Weight Loss (lbs): 19 lb (8.618 kg) Peak Weight: 215 lb   Body Composition  Body Fat %: 41.9 % Fat Mass (lbs): 79.6 lbs Muscle Mass (lbs): 105 lbs Total Body Water  (lbs): 75 lbs Visceral Fat Rating : 11   Other Clinical Data Fasting: no Labs: no Today's Visit #: 55 Starting Date: 04/29/18     ASSESSMENT AND PLAN:  Diet: Cannon is currently in the action stage of change. As such, her goal is to continue with weight loss efforts. She has agreed to Category 2 Plan and practicing portion control and making smarter food choices, such as increasing vegetables and decreasing simple carbohydrates.  Exercise: Elyza has been instructed to continue exercising as is and try to increase walking while recovering from eye surgery/retinal tear  for weight loss and overall health benefits.   Behavior Modification:  We discussed the following Behavioral Modification Strategies today: increasing lean protein intake, decreasing simple carbohydrates, increasing vegetables, increase H2O intake, increase high fiber foods, no skipping meals, emotional eating strategies , and holiday eating strategies. We discussed various medication options to help Torey with her weight loss efforts and we both agreed to continue metformin for prediabetes and [name] for emotional eating strategies. .  Return in about 11 weeks (around 06/04/2023).Marland Kitchen She was informed of the importance of frequent follow up visits to maximize her success with intensive lifestyle modifications for her multiple health conditions.  Attestation Statements:   Reviewed by clinician on day of visit: allergies, medications, problem list, medical history, surgical history,  family history, social history, and previous encounter notes.   Time spent on visit including pre-visit chart review and post-visit care and charting was 45 minutes.    Jacqueline Overby, PA-C

## 2023-03-21 ENCOUNTER — Inpatient Hospital Stay: Payer: 59

## 2023-03-25 ENCOUNTER — Ambulatory Visit (INDEPENDENT_AMBULATORY_CARE_PROVIDER_SITE_OTHER): Payer: 59 | Admitting: Physician Assistant

## 2023-03-26 ENCOUNTER — Other Ambulatory Visit: Payer: 59

## 2023-03-26 ENCOUNTER — Ambulatory Visit: Payer: 59

## 2023-03-27 ENCOUNTER — Inpatient Hospital Stay (HOSPITAL_BASED_OUTPATIENT_CLINIC_OR_DEPARTMENT_OTHER): Payer: 59 | Admitting: Hematology and Oncology

## 2023-03-27 ENCOUNTER — Other Ambulatory Visit: Payer: 59

## 2023-03-27 ENCOUNTER — Inpatient Hospital Stay: Payer: 59 | Attending: Hematology

## 2023-03-27 ENCOUNTER — Other Ambulatory Visit (HOSPITAL_COMMUNITY): Payer: Self-pay

## 2023-03-27 ENCOUNTER — Encounter: Payer: Self-pay | Admitting: Hematology and Oncology

## 2023-03-27 ENCOUNTER — Other Ambulatory Visit: Payer: Self-pay

## 2023-03-27 VITALS — BP 135/60 | HR 66 | Temp 98.0°F | Resp 18 | Ht 66.0 in | Wt 196.8 lb

## 2023-03-27 DIAGNOSIS — C569 Malignant neoplasm of unspecified ovary: Secondary | ICD-10-CM | POA: Insufficient documentation

## 2023-03-27 DIAGNOSIS — N183 Chronic kidney disease, stage 3 unspecified: Secondary | ICD-10-CM

## 2023-03-27 DIAGNOSIS — Z79811 Long term (current) use of aromatase inhibitors: Secondary | ICD-10-CM | POA: Diagnosis not present

## 2023-03-27 DIAGNOSIS — C786 Secondary malignant neoplasm of retroperitoneum and peritoneum: Secondary | ICD-10-CM | POA: Insufficient documentation

## 2023-03-27 MED ORDER — LEUPROLIDE ACETATE 3.75 MG IM KIT
3.7500 mg | PACK | Freq: Once | INTRAMUSCULAR | Status: AC
  Administered 2023-03-27: 3.75 mg via INTRAMUSCULAR
  Filled 2023-03-27: qty 3.75

## 2023-03-27 MED ORDER — ANASTROZOLE 1 MG PO TABS
1.0000 mg | ORAL_TABLET | Freq: Every day | ORAL | 3 refills | Status: AC
  Filled 2023-03-27: qty 90, 90d supply, fill #0
  Filled 2023-05-12 – 2023-08-11 (×2): qty 90, 90d supply, fill #1
  Filled 2023-11-10: qty 90, 90d supply, fill #2
  Filled 2024-02-26: qty 90, 90d supply, fill #3

## 2023-03-27 NOTE — Assessment & Plan Note (Signed)
Overall, she tolerated treatment well without major side effects CT imaging results are not available but based on my review, overall, the lesions seen in her abdomen were stable and she is completely asymptomatic The plan will be to continue on current treatment with combination of Lupron monthly and aromatase inhibitor daily I plan to see her in 3 months I plan to repeat imaging study again in about 4 to 6 months

## 2023-03-27 NOTE — Progress Notes (Signed)
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) Overall, she tolerated treatment well without major side effects CT imaging results are not available but based on my review, overall, the lesions seen in her abdomen were stable and she is completely asymptomatic The plan will be to continue on current treatment with combination of Lupron monthly and aromatase inhibitor daily I plan to see her in 3 months I plan to repeat imaging study again in about 4 to 6 months  CKD (chronic kidney disease), stage III Her renal function is stable/slightly fluctuated Observe closely Discussed importance of oral fluid hydration  No orders of the defined types were placed in this encounter.   All questions were answered. The patient knows to call the clinic with any problems, questions or concerns. The total time spent in the appointment was 30 minutes encounter with patients including review of chart and various tests results, discussions about plan of care and coordination of care plan   Artis Delay, MD 03/27/2023 1:04 PM  INTERVAL HISTORY: Please see below for problem oriented charting. she returns for treatment and review of imaging studies Unfortunately, CT imaging result is not available Since the last time I saw her, she is doing well Denies abdominal pain or changes in bowel habits She tolerated treatment well without major side effects such as hot flashes  REVIEW OF SYSTEMS:   Constitutional: Denies fevers, chills or abnormal weight loss Eyes: Denies blurriness of vision Ears, nose, mouth, throat, and face: Denies mucositis or sore throat Respiratory: Denies cough, dyspnea or wheezes Cardiovascular: Denies palpitation, chest discomfort or lower extremity swelling Gastrointestinal:   Denies nausea, heartburn or change in bowel habits Skin: Denies abnormal skin rashes Lymphatics: Denies new lymphadenopathy or easy bruising Neurological:Denies numbness, tingling or new weaknesses Behavioral/Psych: Mood is stable, no new changes  All other systems were reviewed with the patient and are negative.  I have reviewed the past medical history, past surgical history, social history and family history with the patient and they are unchanged from previous note.  ALLERGIES:  is allergic to codeine, thimerosal (thiomersal), ciprofloxacin, daypro [oxaprozin], levofloxacin, stadol [butorphanol tartrate], and sulfa antibiotics.  MEDICATIONS:  Current Outpatient Medications  Medication Sig Dispense Refill   acetaminophen (TYLENOL) 500 MG tablet Take 1,000 mg by mouth every 6 (six) hours as needed for moderate pain.     anastrozole (ARIMIDEX) 1 MG tablet Take 1 tablet (1 mg total) by mouth daily. 90 tablet 3   aspirin EC 81 MG tablet Take 81 mg by mouth daily. Swallow whole.     Biotin 5 MG TABS Take 5 mg by mouth daily.     buPROPion (WELLBUTRIN SR) 200 MG 12 hr tablet Take 1 tablet (200 mg total) by mouth 2 (two) times daily. 180 tablet 1   escitalopram (LEXAPRO) 20 MG tablet Take 1 tablet (20 mg total) by mouth daily. 90 tablet 3   ezetimibe (ZETIA) 10 MG tablet Take 1 tablet (10 mg total) by mouth daily. 90 tablet 3   gabapentin (NEURONTIN) 300 MG capsule Take 1 capsule (300 mg total) by mouth 2 (two) times daily. 60 capsule 3   leuprolide (LUPRON) 30 MG injection Inject 30 mg into the muscle every 30 (thirty) days.     metFORMIN (GLUCOPHAGE) 500 MG tablet Take 1 tablet (500  mg total) by mouth every morning AND 2 tablets (1,000 mg total) every evening. Needs office visit. 270 tablet 1   omeprazole (PRILOSEC) 20 MG capsule Take 1 capsule (20 mg total) by mouth daily. 90 capsule 3   Polyethyl Glycol-Propyl Glycol (SYSTANE OP) Place 1 drop into both eyes daily as needed (dry eyes).      Probiotic Product (ALIGN PO) Take 1 tablet by mouth daily.      psyllium (METAMUCIL) 58.6 % powder Take 1 packet by mouth 2 (two) times daily.     tretinoin (RETIN-A) 0.025 % cream Apply 1 application to the face Nightly. 20 g 2   triamcinolone cream (KENALOG) 0.1 % Apply 1 small Application topically 2 (two) times daily to affected area. 454 g 0   Vitamin D, Ergocalciferol, (DRISDOL) 1.25 MG (50000 UNIT) CAPS capsule Take 1 capsule by mouth every 10 days. 9 capsule 1   No current facility-administered medications for this visit.    SUMMARY OF ONCOLOGIC HISTORY: Oncology History Overview Note  2018 - Core biopsy for ER/PR and Foundation One testing performed. Unfortunately, no sufficient tissue for Foundation One testing. ER was 50% and PR 90%. 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     - 09/2000 Chemotherapy   6 cycles of IP cisplatin and etoposide    02/2009 Relapse/Recurrence   CT showed increased size of nodules in pelvis   2010 Surgery   Exlap with section of tumor nodules near cecum and left pelvic sidewall. Tumor: ER negative, PR positive    Treatment Plan Change   Alternated 2 week courses of Megace and Tamoxifen - ended 03/2012   03/2012 PET scan   CT - progressive disease   2013 Treatment Plan Change   Letrozole   01/2016 Imaging   MRI  showed progressive disease   2017 Treatment Plan Change   Two weeks of alternating Tamoxifen 20mg  daily and then Megace 40mg  TID   08/2016 Imaging   MRI showed progressive disease with peritoneal implants near liver, in pelvis   01/2017 Treatment Plan Change   Lupron 11.25 q 3 months   11/2017 Imaging   Overall mixed response.   Mixed cystic/solid lesions in the left pelvis are mildly improved.   Cystic peritoneal disease, including the dominant lesion along the posterior right hepatic lobe, is mildly progressed.   Subcapsular lesion along the posterior right hepatic lobe is unchanged.   03/2018 Imaging   MRI A/P: Mixed response of individual peritoneal metastases in the pelvis, as described above. Overall, there has been no significant change in bulk of disease.   Stable cystic peritoneal metastatic disease along the capsular surfaces of the liver and spleen.   No new sites of metastatic disease identified within the abdomen or pelvis.   08/2018 Imaging   MRI A/P: Status post hysterectomy and bilateral salpingo-oophorectomy.   Mixed cystic/solid peritoneal implants in the abdomen/pelvis, as above. Dominant cystic implant along the posterior liver surface is mildly  increased. Remaining lesions are overall grossly unchanged.   No new lesions are identified.   01/28/2019 Imaging   Mri A/P: 1. Relatively similar appearance of peritoneal metastasis. A posterior right hepatic capsular based lesion is similar to minimally decreased in size. Left pelvic implants are primarily similar with possible enlargement of an anterior high left pelvic cystic implant. No new disease identified. 2.  Aortic Atherosclerosis (ICD10-I70.0). 3. Hepatic steatosis. 4. Left adrenal adenoma.   06/02/2019 Imaging   MRI 1. Potential slight enlargement of dominant cystic area and solid component, associated with rind like signal variation on T2 along the inferior right hepatic margin, also potentially  slightly increased. Findings may still be within the realm of measurement and technical variation. Close attention on follow-up. 2. Subtle cystic changes along the cephalad margin of the spleen are difficult to see on previous imaging, perhaps new compared with prior imaging studies. 3. Pelvic implants and left lower quadrant lesion with similar size, of the area along the left iliac vasculature may be slightly larger than on the prior study. 4. Signs of extensive retroperitoneal and pelvic lymphadenectomy. 5. Hepatic steatosis. 6. 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     06/08/2019 Cancer Staging   Staging form: Ovary, AJCC 7th Edition - Clinical: Stage IIIC (rT2, N1, M0) - Signed by Artis Delay, MD on 06/08/2019   06/10/2019 Imaging   1. 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 These findings are slightly worsened in comparison to a remote prior CT examination dated 10/04/2014.   2.  No evidence of metastatic disease in the chest.   3. Status post hysterectomy, pelvic and retroperitoneal lymph node dissection, and ventral hernia mesh repair.   4.  Hepatic steatosis.   5.  Aortic Atherosclerosis (ICD10-I70.0).   06/24/2019 - 09/02/2019 Chemotherapy   The patient had carboplatin for chemotherapy treatment.     09/02/2019 Tumor Marker   Patient's tumor was tested for the following markers: Inhibin B Results of the tumor marker test revealed 94   09/23/2019 Imaging   1. Interval progression of the soft tissue lesions in the anterior left pelvis, likely peritoneal implants, compatible with disease progression. Remaining sites of apparent disease along the liver capsule and posterior spleen are stable 2. Stable 2 cm left adrenal adenoma. 3. Hepatic steatosis. 4.  Right-side predominant colonic diverticulosis without diverticulitis. 5. Aortic Atherosclerosis (ICD10-I70.0).   12/23/2019 Imaging   1. Slight interval increase in size of a mixed solid and cystic nodule in the left hemipelvis measuring 3.0 x 2.9 cm, previously 2.7 x 2.1 cm. 2. Interval decrease in size of a nodule in the left paracolic gutter measuring 1.0 x 0.8 cm, previously 2.1 x 1.6 cm. 3. Stable peritoneal nodules in the hepatorenal recess and at the inferior tip of the spleen. 4. Unchanged nodule or lymph node overlying the left external iliac artery. 5. Enlargement of dominant left pelvic nodule is concerning for disease progression despite interval decrease in size of a nodule in the left paracolic gutter and stability of other nodules. 6. No evidence of metastatic disease in the chest. 7. Stable, benign left adrenal adenoma. 8. Ventral hernia mesh repair with a small component of recurrent hernia inferiorly, containing a single nonobstructed loop of small bowel. 9. Hepatic steatosis. 10. Aortic Atherosclerosis (ICD10-I70.0).  12/23/2019 Tumor Marker   Patient's tumor was tested for the following markers: Inhibin B Results of the tumor marker test revealed 94.9   01/25/2020 Tumor Marker   Patient's tumor was tested for the following markers: Inhibin B Results of the tumor marker test revealed 116.7   02/20/2020 Genetic Testing   Negative genetic testing: no pathogenic variants detected in [facility] Multi-Cancer Panel.  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  The report date is February 20, 2020.   The Multi-Cancer Panel offered by [facility] includes sequencing and/or deletion duplication testing of the following 85 genes: AIP, ALK, APC, ATM, AXIN2,BAP1,  BARD1, BLM, BMPR1A, BRCA1, BRCA2, BRIP1, CASR, CDC73, CDH1, CDK4, CDKN1B, CDKN1C, CDKN2A (p14ARF), CDKN2A (p16INK4a), CEBPA, CHEK2, CTNNA1, DICER1, DIS3L2, EGFR (c.2369C>T, p.Thr790Met variant only), EPCAM  (Deletion/duplication testing only), FH, FLCN, GATA2, GPC3, GREM1 (Promoter region deletion/duplication testing only), HOXB13 (c.251G>A, p.Gly84Glu), HRAS, KIT, MAX, MEN1, MET, MITF (c.952G>A, p.Glu318Lys variant only), MLH1, MSH2, MSH3, MSH6, MUTYH, NBN, NF1, NF2, NTHL1, PALB2, PDGFRA, PHOX2B, PMS2, POLD1, POLE, POT1, PRKAR1A, PTCH1, PTEN, RAD50, RAD51C, RAD51D, RB1, RECQL4, RET, RNF43, RUNX1, SDHAF2, SDHA (sequence changes only), SDHB, SDHC, SDHD, SMAD4, SMARCA4, SMARCB1, SMARCE1, STK11, SUFU, TERC, TERT, TMEM127, TP53, TSC1, TSC2, VHL, WRN and WT1.    03/02/2020 Tumor Marker   Patient's tumor was tested for the following markers: Inhibin B Results of the tumor marker test revealed 80.8   04/17/2020 Imaging   1. Overall, exam is stable. Multiple peritoneal nodules are again seen. The index nodule in the left lower quadrant is mildly increased in size in the interval. The lesion within the anterior left pelvis has decreased in size in the interval. There has also been decrease in size of cystic lesion within the a hepatorenal recess. The remaining peritoneal lesions are unchanged. No new lesions identified. 2. Stable left adrenal nodule. 3.  Aortic Atherosclerosis (ICD10-I70.0).     08/21/2020 Imaging   Bone density is normal AP spine T score 0.8 Femoral neck on the left, T score -0.2 Femoral neck on the right ,T score -0.4   10/17/2020 Imaging   1. Interval progression of peritoneal nodules along the left pelvic sidewall, concerning for progressive metastatic disease. 2. Small capsular lesion medial right liver and the apparent cystic lesion superior to the right kidney are similar to prior. 3. Tiny soft tissue nodule anterior aspect of the lateral left pelvis described as decreasing on the prior study has decreased further on today's exam. 4. Stable left adrenal nodule. This cannot be definitively characterized. 5. Tiny nonobstructing stone lower pole right kidney. 6. Aortic Atherosclerosis  (ICD10-I70.0).   10/26/2020 Procedure   Successful placement of a right internal jugular approach power injectable Port-A-Cath. The catheter is ready for immediate use.       10/31/2020 - 10/04/2021 Chemotherapy   Patient is on Treatment Plan : OVARIAN Paclitaxel R6,0,45,40 q28d     01/25/2021 Imaging   1. Signs of peritoneal disease with scattered peritoneal nodules. The index lesions are either stable or decreased in size in the interval as detailed above. No new sites of disease. 2. Ventral pelvic wall hernia contains a nonobstructed loop of small bowel. 3. Aortic Atherosclerosis (ICD10-I70.0).     06/14/2021 Imaging   1. Multiple peritoneal metastatic nodules are slightly diminished in size. 2. A left external iliac lymph node or nodule is however stable to slightly enlarged. 3. Overall findings are most consistent with continued treatment response of metastatic disease. 4. No evidence of metastatic disease within the  chest. 5. Hepatic steatosis.     06/14/2021 Imaging   Right: Findings consistent with 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   Left: No evidence of thrombosis in the subclavian.   06/20/2021 Procedure   Successful right IJ vein Port-A-Cath explant.     10/18/2021 Imaging   1. Similar to mild interval increase in size of left external iliac lymph node. 2. Multiple peritoneal metastatic nodules are grossly unchanged in size when compared to prior exam   02/14/2022 Imaging   1. Enlarging lymph node along the LEFT iliac chain, compatible with mild worsening of disease in this area. Stable stigmata of disease in the upper abdomen. 2. Post LEFT retroperitoneal and pelvic lymphadenectomy. 3. Stable LEFT adrenal adenoma. 4. Stable 4 mm pulmonary nodule in the LEFT lung base. 5. Aortic atherosclerosis. 6. Mild hepatic steatosis. 7. Post abdominal wall reconstruction with hernia below the mesh as on  previous imaging.     05/24/2022 Imaging   1. Exam is generally stable with no substantial interval change and no clear trend towards improving or progressive disease. There is one tiny soft tissue nodule along the medial capsule of the posterior right liver measuring 8 x 5 mm, more conspicuous than on the prior study. Close attention on follow-up recommended. 2. A second capsular implant along the medial liver capsule more anteriorly measures smaller on today's exam. Additional capsular implants along the medial liver and inferior spleen are stable. The left iliac lymph node identified as enlarging on the previous study is not substantially changed in the interval. 3. Stable 2 cm left adrenal nodule, previously characterized as adenoma. 4. Stable 4 mm left lower lobe pulmonary nodule. 5. Similar appearance of ventral hernia repair with recurrence. 6.  Aortic Atherosclerosis (ICD10-I70.0).   08/28/2022 Imaging   Bone density scan is normal   11/21/2022 Imaging   CT ABDOMEN PELVIS W CONTRAST  Result Date: 11/20/2022 CLINICAL DATA:  Ovarian cancer, assess treatment response * Tracking Code: BO * EXAM: CT ABDOMEN AND PELVIS WITH CONTRAST TECHNIQUE: Multidetector CT imaging of the abdomen and pelvis was performed using the standard protocol following bolus administration of intravenous contrast. RADIATION DOSE REDUCTION: This exam was performed according to the departmental dose-optimization program which includes automated exposure control, adjustment of the mA and/or kV according to patient size and/or use of iterative reconstruction technique. CONTRAST:  OMNIPAQUE IOHEXOL 300 MG/ML SOLN additional oral enteric contrast COMPARISON:  05/23/2022 FINDINGS: Lower chest: No acute abnormality. Hepatobiliary: No solid liver abnormality is seen. Hepatic steatosis. No gallstones, gallbladder wall thickening, or biliary dilatation. Pancreas: Unremarkable. No pancreatic ductal dilatation or surrounding  inflammatory changes. Spleen: Normal in size. Unchanged partially exophytic cyst arising from the inferior portion of the spleen (series 2, image 21) Adrenals/Urinary Tract: Unchanged, definitively benign macroscopic fat containing left adrenal adenoma, for which no further follow-up or characterization is required (series 2, image 21). Simple, benign bilateral renal cortical cysts, for which no further follow-up or characterization is required. Kidneys are otherwise normal, without renal calculi, solid lesion, or hydronephrosis. Bladder is unremarkable. Stomach/Bowel: Stomach is within normal limits. Appendix appears normal. No evidence of bowel wall thickening, distention, or inflammatory changes. Colonic diverticulosis. Moderate burden of stool throughout the colon and rectum. Status post omentectomy. Vascular/Lymphatic: Aortic atherosclerosis. Slight interval enlargement of a left iliac lymph node, measuring 1.9 x 1.7 cm, previously 1.7 x 1.5 cm (series 2, image 60). Reproductive: Status post hysterectomy and oophorectomy. Other: Status post  midline ventral hernia mesh repair. No ascites. Unchanged cyst in the vicinity of the posterior medial right lobe of the liver measuring 3.1 x 2.6 cm (series 2, image 23). Similar appearance of minimal, somewhat nodular soft tissue thickening in the right hepatorenal recess in this vicinity (series 2, image 23). Slight interval enlargement of a soft tissue nodule in the inferior left paracolic gutter measuring 1.0 cm, previously 0.7 cm (series 2, image 63). Musculoskeletal: No acute or significant osseous findings. IMPRESSION: 1. Slight interval enlargement of a left iliac lymph node, concerning for nodal metastatic disease. 2. Slight interval enlargement of a soft tissue nodule in the inferior left paracolic gutter, concerning for minimally worsened peritoneal metastatic disease. Similar appearance of cystic peritoneal implants and minimal, somewhat nodular soft tissue  thickening in the right hepatorenal recess. 3. Status post hysterectomy and oophorectomy. 4. Hepatic steatosis. 5. Colonic diverticulosis. Aortic Atherosclerosis (ICD10-I70.0). Electronically Signed   By: Jearld Lesch M.D.   On: 11/20/2022 16:36        PHYSICAL EXAMINATION: ECOG PERFORMANCE STATUS: 0 - Asymptomatic  Vitals:   03/27/23 1257  BP: 135/60  Pulse: 66  Resp: 18  Temp: 98 F (36.7 C)  SpO2: 100%   Filed Weights   03/27/23 1257  Weight: 196 lb 12.8 oz (89.3 kg)    GENERAL:alert, no distress and comfortable   LABORATORY DATA:  I have reviewed the data as listed    Component Value Date/Time   NA 135 03/19/2023 0747   NA 140 07/09/2021 0813   NA 141 09/24/2013 1131   K 4.1 03/19/2023 0747   K 4.5 09/24/2013 1131   CL 101 03/19/2023 0747   CO2 28 03/19/2023 0747   CO2 24 09/24/2013 1131   GLUCOSE 86 03/19/2023 0747   GLUCOSE 90 09/24/2013 1131   BUN 20 03/19/2023 0747   BUN 20 07/09/2021 0813   BUN 21.2 09/24/2013 1131   CREATININE 1.16 (H) 03/19/2023 0747   CREATININE 1.19 (H) 06/20/2021 1513   CREATININE 1.1 09/24/2013 1131   CALCIUM 9.6 03/19/2023 0747   CALCIUM 9.5 09/24/2013 1131   PROT 7.1 03/19/2023 0747   PROT 6.7 11/02/2021 0000   PROT 6.8 09/24/2013 1131   ALBUMIN 4.1 03/19/2023 0747   ALBUMIN 4.4 11/02/2021 0000   ALBUMIN 3.9 09/24/2013 1131   AST 17 03/19/2023 0747   AST 21 06/20/2021 1513   AST 23 09/24/2013 1131   ALT 14 03/19/2023 0747   ALT 19 06/20/2021 1513   ALT 27 09/24/2013 1131   ALKPHOS 91 03/19/2023 0747   ALKPHOS 81 09/24/2013 1131   BILITOT 0.5 03/19/2023 0747   BILITOT 0.4 11/02/2021 0000   BILITOT 0.4 06/20/2021 1513   BILITOT 0.53 09/24/2013 1131   GFRNONAA 52 (L) 03/19/2023 0747   GFRNONAA 51 (L) 06/20/2021 1513   GFRAA 55 (L) 01/24/2020 1530   GFRAA 57 (L) 12/21/2019 1259    No results found for: "SPEP", "UPEP"  Lab Results  Component Value Date   WBC 6.0 03/19/2023   NEUTROABS 3.4 03/19/2023   HGB 13.6  03/19/2023   HCT 41.0 03/19/2023   MCV 86.5 03/19/2023   PLT 293 03/19/2023      Chemistry      Component Value Date/Time   NA 135 03/19/2023 0747   NA 140 07/09/2021 0813   NA 141 09/24/2013 1131   K 4.1 03/19/2023 0747   K 4.5 09/24/2013 1131   CL 101 03/19/2023 0747   CO2 28 03/19/2023 0747  CO2 24 09/24/2013 1131   BUN 20 03/19/2023 0747   BUN 20 07/09/2021 0813   BUN 21.2 09/24/2013 1131   CREATININE 1.16 (H) 03/19/2023 0747   CREATININE 1.19 (H) 06/20/2021 1513   CREATININE 1.1 09/24/2013 1131      Component Value Date/Time   CALCIUM 9.6 03/19/2023 0747   CALCIUM 9.5 09/24/2013 1131   ALKPHOS 91 03/19/2023 0747   ALKPHOS 81 09/24/2013 1131   AST 17 03/19/2023 0747   AST 21 06/20/2021 1513   AST 23 09/24/2013 1131   ALT 14 03/19/2023 0747   ALT 19 06/20/2021 1513   ALT 27 09/24/2013 1131   BILITOT 0.5 03/19/2023 0747   BILITOT 0.4 11/02/2021 0000   BILITOT 0.4 06/20/2021 1513   BILITOT 0.53 09/24/2013 1131       RADIOGRAPHIC STUDIES: I have reviewed multiple CT imaging with the patient I have personally reviewed the radiological images as listed and agreed with the findings in the report.

## 2023-03-27 NOTE — Assessment & Plan Note (Signed)
Her renal function is stable/slightly fluctuated Observe closely Discussed importance of oral fluid hydration

## 2023-03-29 ENCOUNTER — Telehealth: Payer: Self-pay | Admitting: Hematology and Oncology

## 2023-03-29 NOTE — Telephone Encounter (Signed)
 Left patient a vm regarding upcoming appointment

## 2023-03-31 ENCOUNTER — Telehealth: Payer: Self-pay

## 2023-03-31 NOTE — Telephone Encounter (Signed)
-----   Message from Artis Delay sent at 03/31/2023  7:16 AM EST ----- Please let her know Ct is resulted, sizes are minimally changed Plan to continue same regimen without changes

## 2023-03-31 NOTE — Telephone Encounter (Signed)
Called and given below message. She verbalized understanding and appreciated the call.

## 2023-04-01 ENCOUNTER — Ambulatory Visit: Payer: 59

## 2023-04-09 ENCOUNTER — Encounter (INDEPENDENT_AMBULATORY_CARE_PROVIDER_SITE_OTHER): Payer: 59 | Admitting: Ophthalmology

## 2023-04-09 NOTE — Progress Notes (Signed)
[facility] - Clinic Note  04/10/2023   CHIEF COMPLAINT Patient presents for Retina Follow Up  HISTORY OF PRESENT ILLNESS: [name] is a 67 y.o. female who presents to the clinic today for:  HPI     Retina Follow Up   In right eye.  This started 3 weeks ago.  Duration of weeks.  Since onset it is stable.  I, the attending physician,  performed the HPI with the patient and updated documentation appropriately.        Comments   3 week retina tear OD pt is reporting no vision changes noticed she has some floaters and has noticed a white spot when laying down at night       Last edited by Rennis Chris, MD on 04/11/2023 12:50 AM.     Pt states she has seen two fol only at night when she goes to bed  Referring physician: Diona Foley, MD [address] [place],  [place] [address]  HISTORICAL INFORMATION:  Selected notes from the MEDICAL RECORD NUMBER Referred by Dr. Zenaida Niece for HST LEE:  Ocular Hx- PMH-   CURRENT MEDICATIONS: Current Outpatient Medications (Ophthalmic Drugs)  Medication Sig   Polyethyl Glycol-Propyl Glycol (SYSTANE OP) Place 1 drop into both eyes daily as needed (dry eyes).   No current facility-administered medications for this visit. (Ophthalmic Drugs)   Current Outpatient Medications (Other)  Medication Sig   acetaminophen (TYLENOL) 500 MG tablet Take 1,000 mg by mouth every 6 (six) hours as needed for moderate pain.   anastrozole (ARIMIDEX) 1 MG tablet Take 1 tablet (1 mg total) by mouth daily.   aspirin EC 81 MG tablet Take 81 mg by mouth daily. Swallow whole.   Biotin 5 MG TABS Take 5 mg by mouth daily.   buPROPion (WELLBUTRIN SR) 200 MG 12 hr tablet Take 1 tablet (200 mg total) by mouth 2 (two) times daily.   escitalopram (LEXAPRO) 20 MG tablet Take 1 tablet (20 mg total) by mouth daily.   ezetimibe (ZETIA) 10 MG tablet Take 1 tablet (10 mg total) by mouth daily.   gabapentin (NEURONTIN) 300 MG capsule Take 1 capsule  (300 mg total) by mouth 2 (two) times daily.   leuprolide (LUPRON) 30 MG injection Inject 30 mg into the muscle every 30 (thirty) days.   metFORMIN (GLUCOPHAGE) 500 MG tablet Take 1 tablet (500 mg total) by mouth every morning AND 2 tablets (1,000 mg total) every evening. Needs office visit.   omeprazole (PRILOSEC) 20 MG capsule Take 1 capsule (20 mg total) by mouth daily.   Probiotic Product (ALIGN PO) Take 1 tablet by mouth daily.    psyllium (METAMUCIL) 58.6 % powder Take 1 packet by mouth 2 (two) times daily.   tretinoin (RETIN-A) 0.025 % cream Apply 1 application to the face Nightly.   triamcinolone cream (KENALOG) 0.1 % Apply 1 small Application topically 2 (two) times daily to affected area.   Vitamin D, Ergocalciferol, (DRISDOL) 1.25 MG (50000 UNIT) CAPS capsule Take 1 capsule by mouth every 10 days.   No current facility-administered medications for this visit. (Other)   REVIEW OF SYSTEMS: ROS   Positive for: Endocrine, Eyes Negative for: Constitutional, Gastrointestinal, Neurological, Skin, Genitourinary, Musculoskeletal, HENT, Cardiovascular, Respiratory, Psychiatric, Allergic/Imm, Heme/Lymph Last edited by Etheleen Mayhew, COT on 04/10/2023  8:54 AM.      ALLERGIES Allergies  Allergen Reactions   Codeine Nausea And Vomiting   Thimerosal (Thiomersal) Other (See Comments)  Eye redness    Ciprofloxacin Other (See Comments)    FATIGUE   Daypro [Oxaprozin] Rash   Levofloxacin Other (See Comments)    fatigue   Stadol [Butorphanol Tartrate] Other (See Comments)    ALTERED MENTAL STATUS   Sulfa Antibiotics Rash   PAST MEDICAL HISTORY Past Medical History:  Diagnosis Date   Anxiety    Arthritis    Cervical syndrome    CKD (chronic kidney disease), stage III (HCC)    Constipation    COVID-19 03/26/2019   Family history of colon cancer 02/15/2020   Fatty liver    GERD (gastroesophageal reflux disease)    Granulosa cell tumor of ovary    History of hiatal  hernia    Hyperlipidemia    Joint pain    Neck pain    Obesity    Osteoarthritis    Ovarian cancer (HCC)    PONV (postoperative nausea and vomiting)    history of n/v,  past surgeries no n/v   Sleep apnea    uses CPAP   Tremor    Past Surgical History:  Procedure Laterality Date   ABDOMINAL HYSTERECTOMY     TAH/BSO   APPENDECTOMY     EXPLORATORY LAPAROTOMY     for bowel obstruction   IR IMAGING GUIDED PORT INSERTION  10/25/2020   IR REMOVAL TUN ACCESS W/ PORT W/O FL MOD SED  06/19/2021   left toe surgery Left    Secondary tumor debulking  2002   SHOULDER SURGERY     2017 left   Tumor debulking  2010   VENTRAL HERNIA REPAIR     FAMILY HISTORY Family History  Problem Relation Age of Onset   Basal cell carcinoma Mother        23s   Thyroid disease Mother    Stroke Father    Colon cancer Father 10   Hypertension Father    Heart disease Father    Colon cancer Paternal Uncle        dx 48s   Colon cancer Cousin        maternal cousin; dx 25s   SOCIAL HISTORY Social History   Tobacco Use   Smoking status: Never   Smokeless tobacco: Never  Vaping Use   Vaping status: Never Used  Substance Use Topics   Alcohol use: Not Currently   Drug use: No       OPHTHALMIC EXAM:  Base Eye Exam     Visual Acuity (Snellen - Linear)       Right Left   Dist cc 20/40 20/20 -1   Dist ph cc NI     Correction: Glasses         Tonometry (Tonopen, 9:04 AM)       Right Left   Pressure 20 20         Pupils       Pupils Dark Light Shape React APD   Right PERRL 4 3 Round Brisk None   Left PERRL 4 3 Round Brisk None         Visual Fields       Left Right    Full Full         Extraocular Movement       Right Left    Full, Ortho Full, Ortho         Neuro/Psych     Oriented x3: Yes   Mood/Affect: Normal         Dilation  Both eyes: 2.5% Phenylephrine @ 9:04 AM           Slit Lamp and Fundus Exam     Slit Lamp Exam       Right Left    Lids/Lashes Dermatochalasis - upper lid Dermatochalasis - upper lid   Conjunctiva/Sclera White and quiet White and quiet   Cornea trace PEE, trace tear film debris 1+ fine PEE   Anterior Chamber deep and clear deep and clear   Iris Round and dilated Round and dilated   Lens 2+ Nuclear sclerosis, 2+ Cortical cataract 2+ Nuclear sclerosis, 2+ Cortical cataract   Anterior Vitreous mild syneresis, no pigment, Posterior vitreous detachment, vitreous condensations mild syneresis, Posterior vitreous detachment         Fundus Exam       Right Left   Disc Pink and Sharp Pink and Sharp, mild tilt   C/D Ratio 0.4 0.3   Macula Flat, Blunted foveal reflex, ERM with striae Flat, Blunted foveal reflex   Vessels mild attenuation, mild tortuosity, mild copper wiring attenuated, Tortuous   Periphery HST at 1030 with shallow cuff of SRF anteriorly, light pigmented demarcation line posteriorly -- good laser changes Attached, No heme           Refraction     Wearing Rx       Sphere Cylinder Axis Add   Right -4.00 +0.50 170 +2.50   Left -4.00 +0.50 048 +2.50           IMAGING AND PROCEDURES  Imaging and Procedures for 04/10/2023  OCT, Retina - OU - Both Eyes       Right Eye Quality was good. Central Foveal Thickness: 422. Progression has been stable. Findings include no IRF, abnormal foveal contour, epiretinal membrane, macular pucker, subretinal fluid (Mild interval improvement in vitreous opacities, Mild ERM with central thickening, loss of foveal contour and pucker, focal retinal break with +IRF/SRF superior periphery caught on widefield --not imaged today, focal ellipsoid disruption centrally).   Left Eye Quality was good. Central Foveal Thickness: 317. Progression has been stable. Findings include normal foveal contour, no IRF, no SRF.   Notes *Images captured and stored on drive  Diagnosis / Impression:  ZO:XWRU interval improvement in vitreous opacities, Mild ERM with  central thickening, loss of foveal contour and pucker, focal retinal break with +IRF/SRF superior periphery caught on widefield --not imaged today, focal ellipsoid disruption centrally OS: NFP, no IRF/SRF  Clinical management:  See below  Abbreviations: NFP - Normal foveal profile. CME - cystoid macular edema. PED - pigment epithelial detachment. IRF - intraretinal fluid. SRF - subretinal fluid. EZ - ellipsoid zone. ERM - epiretinal membrane. ORA - outer retinal atrophy. ORT - outer retinal tubulation. SRHM - subretinal hyper-reflective material. IRHM - intraretinal hyper-reflective material           ASSESSMENT/PLAN:   ICD-10-CM   1. Retinal tear of right eye  H33.311 OCT, Retina - OU - Both Eyes    2. Right retinal detachment  H33.21     3. Epiretinal membrane (ERM) of right eye  H35.371 OCT, Retina - OU - Both Eyes    4. Combined forms of age-related cataract of both eyes  H25.813      1,2. Retinal tear with +SRF / focal retinal detachment, OD - bi-lobed retinal tear with +cuff of SRF anteriorly, light pigmented demarcation line posteriorly - s/p laser retinopexy OD (11.13.24) -- good laser changes surrounding - no new RT/RD - f/u in 3 months,  DFE, OCT  3. Epiretinal membrane, right eye  - Mild ERM with central thickening, loss of foveal contour and pucker  - BCVA improved to 20/40 from 20/50 - asymptomatic, no metamorphopsia - monitor for now - f/u 3 months DFE/OCT  4. Mixed Cataract OU - The symptoms of cataract, surgical options, and treatments and risks were discussed with patient. - discussed diagnosis and progression - monitor  Ophthalmic Meds Ordered this visit:  No orders of the defined types were placed in this encounter.    Return in about 3 months (around 07/09/2023) for f/u retinal tear OD, DFE, OCT.  There are no Patient Instructions on file for this visit.  Explained the diagnoses, plan, and follow up with the patient and they expressed understanding.   Patient expressed understanding of the importance of proper follow up care.   This document serves as a record of services personally performed by Karie Chimera, MD, PhD. It was created on their behalf by Annalee Genta, COMT. The creation of this record is the provider's dictation and/or activities during the visit.  Electronically signed by: Annalee Genta, COMT 04/11/23 12:51 AM  This document serves as a record of services personally performed by Karie Chimera, MD, PhD. It was created on their behalf by Glee Arvin. Manson Passey, OA an ophthalmic technician. The creation of this record is the provider's dictation and/or activities during the visit.    Electronically signed by: Glee Arvin. Manson Passey, OA 04/11/23 12:51 AM  Karie Chimera, M.D., Ph.D. Diseases & Surgery of the Retina and Vitreous [facility] 04/10/2023  I have reviewed the above documentation for accuracy and completeness, and I agree with the above. Karie Chimera, M.D., Ph.D. 04/11/23 12:52 AM   Abbreviations: M myopia (nearsighted); A astigmatism; H hyperopia (farsighted); P presbyopia; Mrx spectacle prescription;  CTL contact lenses; OD right eye; OS left eye; OU both eyes  XT exotropia; ET esotropia; PEK punctate epithelial keratitis; PEE punctate epithelial erosions; DES dry eye syndrome; MGD meibomian gland dysfunction; ATs artificial tears; PFAT's preservative free artificial tears; NSC nuclear sclerotic cataract; PSC posterior subcapsular cataract; ERM epi-retinal membrane; PVD posterior vitreous detachment; RD retinal detachment; DM diabetes mellitus; DR diabetic retinopathy; NPDR non-proliferative diabetic retinopathy; PDR proliferative diabetic retinopathy; CSME clinically significant macular edema; DME diabetic macular edema; dbh dot blot hemorrhages; CWS cotton wool spot; POAG primary open angle glaucoma; C/D cup-to-disc ratio; HVF humphrey visual field; GVF goldmann visual field; OCT optical coherence  tomography; IOP intraocular pressure; BRVO Branch retinal vein occlusion; CRVO central retinal vein occlusion; CRAO central retinal artery occlusion; BRAO branch retinal artery occlusion; RT retinal tear; SB scleral buckle; PPV pars plana vitrectomy; VH Vitreous hemorrhage; PRP panretinal laser photocoagulation; IVK intravitreal kenalog; VMT vitreomacular traction; MH Macular hole;  NVD neovascularization of the disc; NVE neovascularization elsewhere; AREDS age related eye disease study; ARMD age related macular degeneration; POAG primary open angle glaucoma; EBMD epithelial/anterior basement membrane dystrophy; ACIOL anterior chamber intraocular lens; IOL intraocular lens; PCIOL posterior chamber intraocular lens; Phaco/IOL phacoemulsification with intraocular lens placement; PRK photorefractive keratectomy; LASIK laser assisted in situ keratomileusis; HTN hypertension; DM diabetes mellitus; COPD chronic obstructive pulmonary disease

## 2023-04-10 ENCOUNTER — Encounter (INDEPENDENT_AMBULATORY_CARE_PROVIDER_SITE_OTHER): Payer: Self-pay | Admitting: Ophthalmology

## 2023-04-10 ENCOUNTER — Ambulatory Visit (INDEPENDENT_AMBULATORY_CARE_PROVIDER_SITE_OTHER): Payer: 59 | Admitting: Ophthalmology

## 2023-04-10 DIAGNOSIS — H3321 Serous retinal detachment, right eye: Secondary | ICD-10-CM

## 2023-04-10 DIAGNOSIS — H25813 Combined forms of age-related cataract, bilateral: Secondary | ICD-10-CM | POA: Diagnosis not present

## 2023-04-10 DIAGNOSIS — H33311 Horseshoe tear of retina without detachment, right eye: Secondary | ICD-10-CM

## 2023-04-10 DIAGNOSIS — H35371 Puckering of macula, right eye: Secondary | ICD-10-CM

## 2023-04-11 ENCOUNTER — Encounter (INDEPENDENT_AMBULATORY_CARE_PROVIDER_SITE_OTHER): Payer: Self-pay | Admitting: Ophthalmology

## 2023-04-24 ENCOUNTER — Inpatient Hospital Stay: Payer: 59 | Attending: Hematology

## 2023-04-24 ENCOUNTER — Inpatient Hospital Stay: Payer: 59

## 2023-04-24 DIAGNOSIS — C569 Malignant neoplasm of unspecified ovary: Secondary | ICD-10-CM | POA: Insufficient documentation

## 2023-04-29 ENCOUNTER — Inpatient Hospital Stay: Payer: 59

## 2023-04-29 ENCOUNTER — Inpatient Hospital Stay: Payer: 59 | Attending: Hematology

## 2023-04-29 VITALS — BP 138/63 | HR 56 | Temp 97.7°F | Resp 18

## 2023-04-29 DIAGNOSIS — C569 Malignant neoplasm of unspecified ovary: Secondary | ICD-10-CM

## 2023-04-29 LAB — COMPREHENSIVE METABOLIC PANEL
ALT: 15 U/L (ref 0–44)
AST: 18 U/L (ref 15–41)
Albumin: 4.1 g/dL (ref 3.5–5.0)
Alkaline Phosphatase: 80 U/L (ref 38–126)
Anion gap: 7 (ref 5–15)
BUN: 25 mg/dL — ABNORMAL HIGH (ref 8–23)
CO2: 26 mmol/L (ref 22–32)
Calcium: 9.4 mg/dL (ref 8.9–10.3)
Chloride: 107 mmol/L (ref 98–111)
Creatinine, Ser: 1.14 mg/dL — ABNORMAL HIGH (ref 0.44–1.00)
GFR, Estimated: 53 mL/min — ABNORMAL LOW (ref >60)
Glucose, Bld: 90 mg/dL (ref 70–99)
Potassium: 4.2 mmol/L (ref 3.5–5.1)
Sodium: 140 mmol/L (ref 135–145)
Total Bilirubin: 0.4 mg/dL (ref 0.0–1.2)
Total Protein: 6.9 g/dL (ref 6.5–8.1)

## 2023-04-29 LAB — CBC WITH DIFFERENTIAL/PLATELET
Abs Immature Granulocytes: 0.01 10*3/uL (ref 0.00–0.07)
Basophils Absolute: 0 10*3/uL (ref 0.0–0.1)
Basophils Relative: 1 %
Eosinophils Absolute: 0.2 10*3/uL (ref 0.0–0.5)
Eosinophils Relative: 3 %
HCT: 39.3 % (ref 36.0–46.0)
Hemoglobin: 12.9 g/dL (ref 12.0–15.0)
Immature Granulocytes: 0 %
Lymphocytes Relative: 28 %
Lymphs Abs: 1.7 10*3/uL (ref 0.7–4.0)
MCH: 27.9 pg (ref 26.0–34.0)
MCHC: 32.8 g/dL (ref 30.0–36.0)
MCV: 84.9 fL (ref 80.0–100.0)
Monocytes Absolute: 0.7 10*3/uL (ref 0.1–1.0)
Monocytes Relative: 11 %
Neutro Abs: 3.4 10*3/uL (ref 1.7–7.7)
Neutrophils Relative %: 57 %
Platelets: 354 10*3/uL (ref 150–400)
RBC: 4.63 MIL/uL (ref 3.87–5.11)
RDW: 13.2 % (ref 11.5–15.5)
WBC: 6 10*3/uL (ref 4.0–10.5)
nRBC: 0 % (ref 0.0–0.2)

## 2023-04-29 MED ORDER — LEUPROLIDE ACETATE 3.75 MG IM KIT
3.7500 mg | PACK | Freq: Once | INTRAMUSCULAR | Status: AC
  Administered 2023-04-29: 3.75 mg via INTRAMUSCULAR
  Filled 2023-04-29: qty 3.75

## 2023-04-29 NOTE — Progress Notes (Signed)
 She called and missed lab/ injection recently. Appts scheduled for today and she is aware of appts. Sent scheduling message to move appts so they are 4 weeks apart.

## 2023-05-13 ENCOUNTER — Other Ambulatory Visit: Payer: Self-pay

## 2023-05-13 ENCOUNTER — Other Ambulatory Visit (HOSPITAL_COMMUNITY): Payer: Self-pay

## 2023-05-14 DIAGNOSIS — E785 Hyperlipidemia, unspecified: Secondary | ICD-10-CM | POA: Diagnosis not present

## 2023-05-14 DIAGNOSIS — Z79899 Other long term (current) drug therapy: Secondary | ICD-10-CM | POA: Diagnosis not present

## 2023-05-15 DIAGNOSIS — M25461 Effusion, right knee: Secondary | ICD-10-CM | POA: Diagnosis not present

## 2023-05-15 DIAGNOSIS — M170 Bilateral primary osteoarthritis of knee: Secondary | ICD-10-CM | POA: Diagnosis not present

## 2023-05-15 LAB — LIPID PANEL
Chol/HDL Ratio: 2.9 {ratio} (ref 0.0–4.4)
Cholesterol, Total: 203 mg/dL — ABNORMAL HIGH (ref 100–199)
HDL: 69 mg/dL (ref >39)
LDL Chol Calc (NIH): 113 mg/dL — ABNORMAL HIGH (ref 0–99)
Triglycerides: 118 mg/dL (ref 0–149)
VLDL Cholesterol Cal: 21 mg/dL (ref 5–40)

## 2023-05-15 NOTE — Progress Notes (Signed)
Canceled 2/4 appts per her request due to being out of town that day. [name] will reschedule appts.

## 2023-05-19 DIAGNOSIS — M79671 Pain in right foot: Secondary | ICD-10-CM | POA: Diagnosis not present

## 2023-05-19 DIAGNOSIS — M722 Plantar fascial fibromatosis: Secondary | ICD-10-CM | POA: Diagnosis not present

## 2023-05-22 ENCOUNTER — Inpatient Hospital Stay: Payer: 59

## 2023-05-27 ENCOUNTER — Inpatient Hospital Stay: Payer: 59

## 2023-05-29 ENCOUNTER — Other Ambulatory Visit: Payer: Self-pay | Admitting: Internal Medicine

## 2023-05-29 ENCOUNTER — Ambulatory Visit: Payer: 59

## 2023-05-29 DIAGNOSIS — Z1231 Encounter for screening mammogram for malignant neoplasm of breast: Secondary | ICD-10-CM

## 2023-06-03 ENCOUNTER — Other Ambulatory Visit: Payer: 59

## 2023-06-04 ENCOUNTER — Inpatient Hospital Stay: Payer: 59

## 2023-06-04 ENCOUNTER — Inpatient Hospital Stay: Payer: 59 | Attending: Hematology

## 2023-06-04 VITALS — BP 111/49 | HR 58 | Temp 98.1°F | Resp 18

## 2023-06-04 DIAGNOSIS — C569 Malignant neoplasm of unspecified ovary: Secondary | ICD-10-CM

## 2023-06-04 LAB — CBC WITH DIFFERENTIAL/PLATELET
Abs Immature Granulocytes: 0.01 10*3/uL (ref 0.00–0.07)
Basophils Absolute: 0.1 10*3/uL (ref 0.0–0.1)
Basophils Relative: 1 %
Eosinophils Absolute: 0.2 10*3/uL (ref 0.0–0.5)
Eosinophils Relative: 2 %
HCT: 39.7 % (ref 36.0–46.0)
Hemoglobin: 13 g/dL (ref 12.0–15.0)
Immature Granulocytes: 0 %
Lymphocytes Relative: 22 %
Lymphs Abs: 1.4 10*3/uL (ref 0.7–4.0)
MCH: 27.5 pg (ref 26.0–34.0)
MCHC: 32.7 g/dL (ref 30.0–36.0)
MCV: 84.1 fL (ref 80.0–100.0)
Monocytes Absolute: 0.7 10*3/uL (ref 0.1–1.0)
Monocytes Relative: 11 %
Neutro Abs: 3.9 10*3/uL (ref 1.7–7.7)
Neutrophils Relative %: 64 %
Platelets: 331 10*3/uL (ref 150–400)
RBC: 4.72 MIL/uL (ref 3.87–5.11)
RDW: 13.4 % (ref 11.5–15.5)
WBC: 6.2 10*3/uL (ref 4.0–10.5)
nRBC: 0 % (ref 0.0–0.2)

## 2023-06-04 LAB — COMPREHENSIVE METABOLIC PANEL
ALT: 16 U/L (ref 0–44)
AST: 17 U/L (ref 15–41)
Albumin: 4.2 g/dL (ref 3.5–5.0)
Alkaline Phosphatase: 80 U/L (ref 38–126)
Anion gap: 6 (ref 5–15)
BUN: 23 mg/dL (ref 8–23)
CO2: 28 mmol/L (ref 22–32)
Calcium: 9.6 mg/dL (ref 8.9–10.3)
Chloride: 104 mmol/L (ref 98–111)
Creatinine, Ser: 1.16 mg/dL — ABNORMAL HIGH (ref 0.44–1.00)
GFR, Estimated: 52 mL/min — ABNORMAL LOW (ref >60)
Glucose, Bld: 92 mg/dL (ref 70–99)
Potassium: 4.4 mmol/L (ref 3.5–5.1)
Sodium: 138 mmol/L (ref 135–145)
Total Bilirubin: 0.4 mg/dL (ref 0.0–1.2)
Total Protein: 6.9 g/dL (ref 6.5–8.1)

## 2023-06-04 MED ORDER — LEUPROLIDE ACETATE 3.75 MG IM KIT
3.7500 mg | PACK | Freq: Once | INTRAMUSCULAR | Status: AC
  Administered 2023-06-04: 3.75 mg via INTRAMUSCULAR
  Filled 2023-06-04: qty 3.75

## 2023-06-05 ENCOUNTER — Encounter (INDEPENDENT_AMBULATORY_CARE_PROVIDER_SITE_OTHER): Payer: Self-pay | Admitting: Physician Assistant

## 2023-06-05 ENCOUNTER — Ambulatory Visit (INDEPENDENT_AMBULATORY_CARE_PROVIDER_SITE_OTHER): Payer: 59 | Admitting: Physician Assistant

## 2023-06-05 ENCOUNTER — Ambulatory Visit
Admission: RE | Admit: 2023-06-05 | Discharge: 2023-06-05 | Disposition: A | Discharge status: Home / Self Care | Payer: 59 | Source: Ambulatory Visit

## 2023-06-05 ENCOUNTER — Other Ambulatory Visit (HOSPITAL_COMMUNITY): Payer: Self-pay

## 2023-06-05 VITALS — BP 123/76 | HR 73 | Temp 98.2°F | Ht 66.0 in | Wt 190.0 lb

## 2023-06-05 DIAGNOSIS — Z6830 Body mass index (BMI) 30.0-30.9, adult: Secondary | ICD-10-CM

## 2023-06-05 DIAGNOSIS — E559 Vitamin D deficiency, unspecified: Secondary | ICD-10-CM | POA: Diagnosis not present

## 2023-06-05 DIAGNOSIS — Z1231 Encounter for screening mammogram for malignant neoplasm of breast: Secondary | ICD-10-CM

## 2023-06-05 DIAGNOSIS — E669 Obesity, unspecified: Secondary | ICD-10-CM

## 2023-06-05 DIAGNOSIS — F3289 Other specified depressive episodes: Secondary | ICD-10-CM

## 2023-06-05 DIAGNOSIS — R7303 Prediabetes: Secondary | ICD-10-CM | POA: Diagnosis not present

## 2023-06-05 DIAGNOSIS — E7849 Other hyperlipidemia: Secondary | ICD-10-CM

## 2023-06-05 DIAGNOSIS — F5089 Other specified eating disorder: Secondary | ICD-10-CM

## 2023-06-05 MED ORDER — METFORMIN HCL 500 MG PO TABS
ORAL_TABLET | ORAL | 1 refills | Status: DC
  Filled 2023-06-05: qty 270, 90d supply, fill #0

## 2023-06-05 MED ORDER — BUPROPION HCL ER (SR) 200 MG PO TB12
200.0000 mg | ORAL_TABLET | Freq: Two times a day (BID) | ORAL | 1 refills | Status: DC
  Filled 2023-06-05: qty 180, 90d supply, fill #0

## 2023-06-05 NOTE — Progress Notes (Signed)
 SUBJECTIVE:  Chief Complaint: Obesity  Interim History: She is down 6 lbs since her last visit.  Down  [name] is here to discuss her progress with her obesity treatment plan. She is on the keeping a food journal and adhering to recommended goals of 1500 calories and 90 grams of protein and states she is following her eating plan approximately 60 % of the time. She states she is exercising swimming 60 minutes 2-5 times per week.  [name] is a 68 year old female who presents for follow-up of her obesity treatment plan.  She is actively managing her obesity through mindful eating, journaling her food intake, and regular exercise. After a 10-day vacation in [facility], she was concerned about potential weight gain but was relieved to find minimal change. She feels 'heavy' but notes a decrease in visceral adipose tissue and water weight. Her current weight is 190 pounds, with a previous low of 183 pounds in 2023, and she aims to reach 180 pounds.  She is taking metformin 500 mg in the morning and 1000 mg in the evening for prediabetes, experiencing gastrointestinal side effects, specifically gas, which she manages with daily Gas-X. She has experimented with the timing of her metformin doses, noting increased gas when taking two doses in the morning and one in the evening. Her last A1c was in the prediabetic range. Additionally, she is on bupropion SR 200 mg twice daily, ergocalciferol 500 mg every 10 days for vitamin D deficiency, and Zetia 10 mg daily for mixed hyperlipidemia. She has not reported any issues with bupropion but is considering dietary adjustments to manage her cholesterol levels.  She has a history of plantar fasciitis, which has limited her ability to walk. She is performing exercises and we discussed also using ice massage to manage symptoms, which have been improving since December. She has not opted for steroid injections.  OBJECTIVE: Visit Diagnoses: Problem List Items  Addressed This Visit     Vitamin D deficiency   Prediabetes - Primary   Relevant Medications   metFORMIN (GLUCOPHAGE) 500 MG tablet   Other hyperlipidemia, mixed   Depression   Relevant Medications   buPROPion (WELLBUTRIN SR) 200 MG 12 hr tablet   Generalized obesity- Start BMI 33.73   Relevant Medications   metFORMIN (GLUCOPHAGE) 500 MG tablet  Obesity 68 year old female actively managing weight through diet, exercise, and journaling. Weight decreased to 190 lbs, goal is 180 lbs. Experiencing gastrointestinal side effects from metformin. - Continue current dietary and exercise regimen - Adjust metformin to 500 mg AM and 1000 mg PM to reduce gastrointestinal side effects - Provide pescatarian and category three dietary plans - Follow-up in six weeks to monitor weight and progress  Prediabetes Managed with metformin. Last A1c in prediabetic range. Importance of monitoring A1c levels discussed. - Order A1c test - Schedule fasting labs including insulin level and metabolism test on April 3rd at 7:00 AM  Mixed Hyperlipidemia Managed with Zetia 10 mg daily. Importance of monitoring lipid levels discussed. - Continue Zetia 10 mg daily - Order fasting lipid panel during next lab visit  Emotional Eating Managed with bupropion SR 200 mg twice daily. No new issues reported. - Continue bupropion SR 200 mg twice daily  Plantar Fasciitis Recent flare-up managed with exercises and ice massage. Benefits of current management discussed. - Continue plantar fasciitis exercises - Use ice massage with frozen paper cups after prolonged standing  Vitamin D Deficiency Managed with ergocalciferol 500 mg every 10 days. -  Continue ergocalciferol 500 mg every 10 days  General Health Maintenance Up to date with mammogram screening. Discussed stress management and potential cortisol level check if symptoms persist. - Encourage stress management techniques - Consider checking cortisol levels if  symptoms persist  Follow-up - Schedule follow-up appointment in six weeks - Schedule fasting labs and metabolism test on April 3rd at 7:00 AM.  Vitals Temp: 98.2 F (36.8 C) BP: 123/76 Pulse Rate: 73 SpO2: 99 %   Anthropometric Measurements Height: 5\' 6"  (1.676 m) Weight: 190 lb (86.2 kg) BMI (Calculated): 30.68 Weight at Last Visit: 196lb Weight Lost Since Last Visit: 6lb Weight Gained Since Last Visit: 0lb Starting Weight: 209lb Total Weight Loss (lbs): 25 lb (11.3 kg) Peak Weight: 215lb   Body Composition  Body Fat %: 41.1 % Fat Mass (lbs): 78.4 lbs Muscle Mass (lbs): 106.8 lbs Total Body Water (lbs): 73.6 lbs Visceral Fat Rating : 11   Other Clinical Data Today's Visit #: 65 Starting Date: 04/29/18     ASSESSMENT AND PLAN:  Diet: [name] is currently in the action stage of change. As such, her goal is to continue with weight loss efforts. She has agreed to keeping a food journal and adhering to recommended goals of 1500 calories and 90 grams of protein and Pescatarian Plan.  Exercise: [name] has been instructed to work up to a goal of 150 minutes of combined cardio and strengthening exercise per week for weight loss and overall health benefits.   Behavior Modification:  We discussed the following Behavioral Modification Strategies today: increasing lean protein intake, decreasing simple carbohydrates, increasing vegetables, increase H2O intake, increase high fiber foods, no skipping meals, meal planning and cooking strategies, emotional eating strategies , avoiding temptations, planning for success, and keep a strict food journal. We discussed various medication options to help [name] with her weight loss efforts and we both agreed to continue metformin for primary indication of prediabetes and wellbutrin for emotional eating and continue to work on nutritional and behavioral strategies to promote weight loss.  .  Return in about 6 weeks (around 07/17/2023) for  Fasting Lab, Fasting IC.Marland Kitchen She was informed of the importance of frequent follow up visits to maximize her success with intensive lifestyle modifications for her multiple health conditions.  Attestation Statements:   Reviewed by clinician on day of visit: allergies, medications, problem list, medical history, surgical history, family history, social history, and previous encounter notes.   Time spent on visit including pre-visit chart review and post-visit care and charting was 29 minutes.    Nalea Salce, PA-C

## 2023-06-11 ENCOUNTER — Other Ambulatory Visit (HOSPITAL_BASED_OUTPATIENT_CLINIC_OR_DEPARTMENT_OTHER): Payer: Self-pay

## 2023-06-11 ENCOUNTER — Other Ambulatory Visit (HOSPITAL_COMMUNITY): Payer: Self-pay

## 2023-06-11 MED ORDER — MIRABEGRON ER 50 MG PO TB24
50.0000 mg | ORAL_TABLET | Freq: Every day | ORAL | 0 refills | Status: DC
  Filled 2023-06-11 (×2): qty 30, 30d supply, fill #0

## 2023-06-18 DIAGNOSIS — S83241A Other tear of medial meniscus, current injury, right knee, initial encounter: Secondary | ICD-10-CM | POA: Diagnosis not present

## 2023-06-19 ENCOUNTER — Inpatient Hospital Stay: Payer: 59

## 2023-06-24 ENCOUNTER — Inpatient Hospital Stay: Payer: 59

## 2023-06-26 ENCOUNTER — Other Ambulatory Visit: Payer: Self-pay

## 2023-07-02 ENCOUNTER — Inpatient Hospital Stay: Payer: 59 | Attending: Hematology

## 2023-07-02 ENCOUNTER — Inpatient Hospital Stay: Payer: 59

## 2023-07-02 VITALS — BP 134/49 | HR 54 | Temp 98.0°F | Resp 16

## 2023-07-02 DIAGNOSIS — C569 Malignant neoplasm of unspecified ovary: Secondary | ICD-10-CM | POA: Insufficient documentation

## 2023-07-02 LAB — CBC WITH DIFFERENTIAL/PLATELET
Abs Immature Granulocytes: 0.01 10*3/uL (ref 0.00–0.07)
Basophils Absolute: 0.1 10*3/uL (ref 0.0–0.1)
Basophils Relative: 1 %
Eosinophils Absolute: 0.3 10*3/uL (ref 0.0–0.5)
Eosinophils Relative: 5 %
HCT: 39.2 % (ref 36.0–46.0)
Hemoglobin: 12.6 g/dL (ref 12.0–15.0)
Immature Granulocytes: 0 %
Lymphocytes Relative: 33 %
Lymphs Abs: 2.2 10*3/uL (ref 0.7–4.0)
MCH: 27.6 pg (ref 26.0–34.0)
MCHC: 32.1 g/dL (ref 30.0–36.0)
MCV: 86 fL (ref 80.0–100.0)
Monocytes Absolute: 0.7 10*3/uL (ref 0.1–1.0)
Monocytes Relative: 10 %
Neutro Abs: 3.4 10*3/uL (ref 1.7–7.7)
Neutrophils Relative %: 51 %
Platelets: 324 10*3/uL (ref 150–400)
RBC: 4.56 MIL/uL (ref 3.87–5.11)
RDW: 13.5 % (ref 11.5–15.5)
WBC: 6.6 10*3/uL (ref 4.0–10.5)
nRBC: 0 % (ref 0.0–0.2)

## 2023-07-02 LAB — COMPREHENSIVE METABOLIC PANEL
ALT: 16 U/L (ref 0–44)
AST: 17 U/L (ref 15–41)
Albumin: 4.1 g/dL (ref 3.5–5.0)
Alkaline Phosphatase: 81 U/L (ref 38–126)
Anion gap: 4 — ABNORMAL LOW (ref 5–15)
BUN: 30 mg/dL — ABNORMAL HIGH (ref 8–23)
CO2: 32 mmol/L (ref 22–32)
Calcium: 9.4 mg/dL (ref 8.9–10.3)
Chloride: 102 mmol/L (ref 98–111)
Creatinine, Ser: 1.08 mg/dL — ABNORMAL HIGH (ref 0.44–1.00)
GFR, Estimated: 57 mL/min — ABNORMAL LOW (ref >60)
Glucose, Bld: 73 mg/dL (ref 70–99)
Potassium: 4.3 mmol/L (ref 3.5–5.1)
Sodium: 138 mmol/L (ref 135–145)
Total Bilirubin: 0.3 mg/dL (ref 0.0–1.2)
Total Protein: 6.6 g/dL (ref 6.5–8.1)

## 2023-07-02 MED ORDER — LEUPROLIDE ACETATE 3.75 MG IM KIT
3.7500 mg | PACK | Freq: Once | INTRAMUSCULAR | Status: AC
  Administered 2023-07-02: 3.75 mg via INTRAMUSCULAR
  Filled 2023-07-02: qty 3.75

## 2023-07-04 DIAGNOSIS — M25561 Pain in right knee: Secondary | ICD-10-CM | POA: Diagnosis not present

## 2023-07-06 ENCOUNTER — Encounter (HOSPITAL_BASED_OUTPATIENT_CLINIC_OR_DEPARTMENT_OTHER): Payer: Self-pay | Admitting: *Deleted

## 2023-07-06 ENCOUNTER — Ambulatory Visit (HOSPITAL_BASED_OUTPATIENT_CLINIC_OR_DEPARTMENT_OTHER)
Admission: EM | Admit: 2023-07-06 | Discharge: 2023-07-06 | Disposition: A | Discharge status: Home / Self Care | Attending: Family Medicine | Admitting: Family Medicine

## 2023-07-06 DIAGNOSIS — B349 Viral infection, unspecified: Secondary | ICD-10-CM

## 2023-07-06 LAB — POC COVID19/FLU A&B COMBO
Covid Antigen, POC: NEGATIVE
Influenza A Antigen, POC: NEGATIVE
Influenza B Antigen, POC: NEGATIVE

## 2023-07-06 NOTE — ED Provider Notes (Signed)
 [facility]    CSN: [id number] Arrival date & time: 07/06/23  0929      History   Chief Complaint Chief Complaint  Patient presents with   Headache   Cough   Otalgia    HPI [name] is a 68 y.o. female.   Patient is a 68 year old female that presents today with headache, body aches, cough, congestion, fatigue, ear pain, fever that started last night.  Took NyQuil last night and slept through the night. Positive for sick contacts.   Headache Associated symptoms: cough, ear pain and fever   Cough Associated symptoms: ear pain, fever and headaches   Otalgia Associated symptoms: cough, fever and headaches     Past Medical History:  Diagnosis Date   Anxiety    Arthritis    Cervical syndrome    CKD (chronic kidney disease), stage III (HCC)    Constipation    COVID-19 03/26/2019   Family history of colon cancer 02/15/2020   Fatty liver    GERD (gastroesophageal reflux disease)    Granulosa cell tumor of ovary    History of hiatal hernia    Hyperlipidemia    Joint pain    Neck pain    Obesity    Osteoarthritis    Ovarian cancer (HCC)    PONV (postoperative nausea and vomiting)    history of n/v,  past surgeries no n/v   Sleep apnea    uses CPAP   Tremor     Patient Active Problem List   Diagnosis Date Noted   Generalized obesity- Start BMI 33.73 08/22/2022   BMI 29.0-29.9,adult Current BMI 29.9 08/22/2022   Class 1 obesity with serious comorbidity and body mass index (BMI) of 33.0 to 33.9 in adult 04/24/2022   Other constipation 03/26/2022   Dysuria 02/14/2022   Stable angina (HCC) 08/30/2021   Mouth sore secondary to chemotherapy 07/02/2021   Allergy to multiple antibiotics 05/11/2021   Urinary tract infection 05/07/2021   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   Neck pain 02/23/2021   Tremor 02/23/2021   Skin rash 12/27/2020   Elevated liver enzymes 11/07/2020   Anxiety about health 11/01/2020   Cervical pain  (neck) 10/17/2020   Hot flashes 09/21/2020   History of vitamin D deficiency 08/28/2020   Depression 08/28/2020   At risk for dehydration 08/28/2020   Abdominal pain 08/28/2020   Weight gain 07/06/2020   Joint pain 07/06/2020   Elevated BP without diagnosis of hypertension 04/26/2020   Osteoporosis 04/25/2020   Leukocytosis 03/01/2020   Genetic testing 02/23/2020   Family history of colon cancer 02/15/2020   Pain, dental 01/21/2020   Other hyperlipidemia, mixed 11/30/2019   Prediabetes 11/04/2019   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   Poor venous access 08/12/2019   Pancytopenia, acquired (HCC) 08/05/2019   Cellulitis 07/26/2019   CKD (chronic kidney disease), stage III (HCC)    Goals of care, counseling/discussion 06/08/2019   Back pain, chronic 06/08/2019   Insulin resistance 05/25/2018   Vitamin D deficiency 05/25/2018   Class 1 obesity with serious comorbidity and body mass index (BMI) of 31.0 to 31.9 in adult 05/25/2018   Obstructive sleep apnea 08/18/2017   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    Past Surgical History:  Procedure Laterality Date   ABDOMINAL HYSTERECTOMY     TAH/BSO   APPENDECTOMY     EXPLORATORY LAPAROTOMY     for bowel obstruction   IR IMAGING GUIDED PORT INSERTION  10/25/2020   IR REMOVAL TUN ACCESS W/ PORT W/O FL MOD SED  06/19/2021   left toe surgery Left    Secondary tumor debulking  2002   SHOULDER SURGERY     2017 left   Tumor debulking  2010   VENTRAL HERNIA REPAIR      OB History     Gravida  3   Para      Term      Preterm      AB      Living  2      SAB      IAB      Ectopic      Multiple      Live Births               Home Medications    Prior to Admission medications   Medication Sig Start Date End Date Taking? Authorizing Provider  acetaminophen (TYLENOL) 500 MG tablet Take 1,000 mg by mouth every 6 (six) hours as needed for moderate pain.    [provider]   anastrozole (ARIMIDEX) 1 MG tablet Take 1 tablet (1 mg total) by mouth daily. 03/27/23   Artis Delay, MD  aspirin EC 81 MG tablet Take 81 mg by mouth daily. Swallow whole.    [provider]  Biotin 5 MG TABS Take 5 mg by mouth daily.    [provider]  buPROPion (WELLBUTRIN SR) 200 MG 12 hr tablet Take 1 tablet (200 mg total) by mouth 2 (two) times daily. 06/05/23 12/02/23  Rayburn, Fanny Bien, PA-C  escitalopram (LEXAPRO) 20 MG tablet Take 1 tablet (20 mg total) by mouth daily. 02/07/23     ezetimibe (ZETIA) 10 MG tablet Take 1 tablet (10 mg total) by mouth daily. 01/08/23   Dyann Kief, PA-C  gabapentin (NEURONTIN) 300 MG capsule Take 1 capsule (300 mg total) by mouth 2 (two) times daily. 08/20/22   Artis Delay, MD  leuprolide (LUPRON) 30 MG injection Inject 30 mg into the muscle every 30 (thirty) days.    [provider]  metFORMIN (GLUCOPHAGE) 500 MG tablet Take 1 tablet (500 mg total) by mouth every morning AND 2 tablets (1,000 mg total) every evening. Needs office visit. 06/05/23   Rayburn, Fanny Bien, PA-C  mirabegron ER (MYRBETRIQ) 50 MG TB24 tablet Take 1 tablet (50 mg total) by mouth daily. 06/11/23     omeprazole (PRILOSEC) 20 MG capsule Take 1 capsule (20 mg total) by mouth daily. 02/07/23     Polyethyl Glycol-Propyl Glycol (SYSTANE OP) Place 1 drop into both eyes daily as needed (dry eyes).    [provider]  Probiotic Product (ALIGN PO) Take 1 tablet by mouth daily.     [provider]  psyllium (METAMUCIL) 58.6 % powder Take 1 packet by mouth 2 (two) times daily.    [provider]  tretinoin (RETIN-A) 0.025 % cream Apply 1 application to the face Nightly. 12/18/22     triamcinolone cream (KENALOG) 0.1 % Apply 1 small Application topically 2 (two) times daily to affected area. 12/03/22     Vitamin D, Ergocalciferol, (DRISDOL) 1.25 MG (50000 UNIT) CAPS capsule Take 1 capsule by mouth every 10 days. 03/19/23   Rayburn,  Fanny Bien, PA-C    Family History Family History  Problem Relation Age of Onset   Basal cell carcinoma Mother        18s   Thyroid disease Mother    Stroke Father  Colon cancer Father 28   Hypertension Father    Heart disease Father    Colon cancer Paternal Uncle        dx 85s   Colon cancer Cousin        maternal cousin; dx 31s   BRCA 1/2 Neg Hx    Breast cancer Neg Hx     Social History Social History   Tobacco Use   Smoking status: Never   Smokeless tobacco: Never  Vaping Use   Vaping status: Never Used  Substance Use Topics   Alcohol use: Not Currently   Drug use: No     Allergies   Codeine, Thimerosal (thiomersal), Ciprofloxacin, Daypro [oxaprozin], Levofloxacin, Stadol [butorphanol tartrate], and Sulfa antibiotics   Review of Systems Review of Systems  Constitutional:  Positive for fever.  HENT:  Positive for ear pain.   Respiratory:  Positive for cough.   Neurological:  Positive for headaches.     Physical Exam Triage Vital Signs ED Triage Vitals  Encounter Vitals Group     BP 07/06/23 0944 128/80     Systolic BP Percentile --      Diastolic BP Percentile --      Pulse Rate 07/06/23 0944 84     Resp 07/06/23 0944 20     Temp 07/06/23 0944 100 F (37.8 C)     Temp Source 07/06/23 0944 Oral     SpO2 07/06/23 0944 94 %     Weight 07/06/23 0940 190 lb (86.2 kg)     Height 07/06/23 0940 5\' 6"  (1.676 m)     Head Circumference --      Peak Flow --      Pain Score 07/06/23 0939 0     Pain Loc --      Pain Education --      Exclude from Growth Chart --    No data found.  Updated Vital Signs BP 128/80 (BP Location: Right Arm)   Pulse 84   Temp 100 F (37.8 C) (Oral)   Resp 20   Ht 5\' 6"  (1.676 m)   Wt 190 lb (86.2 kg)   SpO2 94%   BMI 30.67 kg/m   Visual Acuity Right Eye Distance:   Left Eye Distance:   Bilateral Distance:    Right Eye Near:   Left Eye Near:    Bilateral Near:     Physical Exam Vitals and nursing note  reviewed.  Constitutional:      Appearance: She is ill-appearing.  HENT:     Right Ear: Tympanic membrane and ear canal normal.     Left Ear: Tympanic membrane and ear canal normal.     Nose: Congestion present.     Mouth/Throat:     Pharynx: Oropharynx is clear.  Eyes:     Conjunctiva/sclera: Conjunctivae normal.  Cardiovascular:     Rate and Rhythm: Normal rate and regular rhythm.     Heart sounds: Normal heart sounds.  Pulmonary:     Effort: Pulmonary effort is normal.     Breath sounds: Normal breath sounds.  Skin:    General: Skin is warm and dry.  Psychiatric:        Mood and Affect: Mood normal.      UC Treatments / Results  Labs (all labs ordered are listed, but only abnormal results are displayed) Labs Reviewed  POC COVID19/FLU A&B COMBO    EKG   Radiology No results found.  Procedures Procedures (including critical care time)  Medications Ordered in UC Medications - No data to display  Initial Impression / Assessment and Plan / UC Course  I have reviewed the triage vital signs and the nursing notes.  Pertinent labs & imaging results that were available during my care of the patient were reviewed by me and considered in my medical decision making (see chart for details).     Viral illness-flu and COVID test were negative here but may be too early for testing.  Symptoms started less than 24 hours ago Recommend over-the-counter medicines to include Tylenol/Motrin for body aches and fever.  Mucinex for cough and congestion Push fluids and rest Follow-up for any worsening issues Final Clinical Impressions(s) / UC Diagnoses   Final diagnoses:  Viral illness     Discharge Instructions      Your flu and COVID test were negative.  I believe that she is too early for testing and I believe this may be flu. You can take over-the-counter medicines for your symptoms.  Recommend Tylenol/Motrin for body aches and fever Mucinex for cough and  congestion Make sure you are drinking fluids and sipping electrolytes. Rest and follow-up as needed     ED Prescriptions   None    PDMP not reviewed this encounter.   Janace Aris, FNP 07/06/23 1023

## 2023-07-06 NOTE — Discharge Instructions (Signed)
 Your flu and COVID test were negative.  I believe that she is too early for testing and I believe this may be flu. You can take over-the-counter medicines for your symptoms.  Recommend Tylenol/Motrin for body aches and fever Mucinex for cough and congestion Make sure you are drinking fluids and sipping electrolytes. Rest and follow-up as needed

## 2023-07-06 NOTE — ED Triage Notes (Signed)
 Patient states cough, headache, fatigue and bilateral ear pain since yesterday.  Taking Nyquil with good sleep last night.

## 2023-07-07 ENCOUNTER — Ambulatory Visit (HOSPITAL_BASED_OUTPATIENT_CLINIC_OR_DEPARTMENT_OTHER)

## 2023-07-07 ENCOUNTER — Ambulatory Visit (HOSPITAL_BASED_OUTPATIENT_CLINIC_OR_DEPARTMENT_OTHER): Admitting: Radiology

## 2023-07-07 ENCOUNTER — Encounter (HOSPITAL_BASED_OUTPATIENT_CLINIC_OR_DEPARTMENT_OTHER): Payer: Self-pay

## 2023-07-07 ENCOUNTER — Ambulatory Visit (HOSPITAL_BASED_OUTPATIENT_CLINIC_OR_DEPARTMENT_OTHER)
Admission: RE | Admit: 2023-07-07 | Discharge: 2023-07-07 | Disposition: A | Discharge status: Home / Self Care | Source: Ambulatory Visit | Attending: Family Medicine | Admitting: Family Medicine

## 2023-07-07 VITALS — BP 115/64 | HR 65 | Temp 98.7°F | Resp 18

## 2023-07-07 DIAGNOSIS — R051 Acute cough: Secondary | ICD-10-CM

## 2023-07-07 DIAGNOSIS — R519 Headache, unspecified: Secondary | ICD-10-CM

## 2023-07-07 DIAGNOSIS — R059 Cough, unspecified: Secondary | ICD-10-CM | POA: Diagnosis not present

## 2023-07-07 DIAGNOSIS — J208 Acute bronchitis due to other specified organisms: Secondary | ICD-10-CM | POA: Diagnosis not present

## 2023-07-07 LAB — POC COVID19/FLU A&B COMBO
Covid Antigen, POC: NEGATIVE
Influenza A Antigen, POC: POSITIVE — AB
Influenza B Antigen, POC: NEGATIVE

## 2023-07-07 MED ORDER — PROMETHAZINE-DM 6.25-15 MG/5ML PO SYRP: 5.0000 mL | ORAL_SOLUTION | Freq: Four times a day (QID) | ORAL | 0 refills | Status: DC | PRN

## 2023-07-07 MED ORDER — ALBUTEROL SULFATE HFA 108 (90 BASE) MCG/ACT IN AERS
2.0000 | INHALATION_SPRAY | RESPIRATORY_TRACT | 0 refills | Status: DC | PRN
Start: 2023-07-07 — End: 2023-11-27

## 2023-07-07 MED ORDER — ALBUTEROL SULFATE HFA 108 (90 BASE) MCG/ACT IN AERS: 2.0000 | INHALATION_SPRAY | RESPIRATORY_TRACT | 0 refills | Status: DC | PRN

## 2023-07-07 MED ORDER — COMPACT SPACE CHAMBER DEVI: 0 refills | Status: DC

## 2023-07-07 MED ORDER — OSELTAMIVIR PHOSPHATE 75 MG PO CAPS: 75.0000 mg | ORAL_CAPSULE | Freq: Two times a day (BID) | ORAL | 0 refills | Status: DC

## 2023-07-07 NOTE — ED Provider Notes (Addendum)
 [facility]    CSN: [id number] Arrival date & time: 07/07/23  1819      History   Chief Complaint Chief Complaint  Patient presents with   Cough    Was seen yesterday for flu like symptoms - Entered by patient   Headache   Facial Pain    HPI [name] is a 68 y.o. female.   Patient reports that symptoms started on Saturday, 07/05/2023.  She has had cough, headache, sinus pressure.  She is having hot and cold chills and perspiring a lot.  She does not think she has had an actual fever.  She was seen here yesterday and she was negative for flu and COVID.  She feels worse and is having more can congestion in her chest and some shortness of breath.   Cough Associated symptoms: chills, diaphoresis and headaches   Associated symptoms: no chest pain, no ear pain, no fever, no rash, no shortness of breath and no sore throat   Headache Associated symptoms: congestion and cough   Associated symptoms: no abdominal pain, no back pain, no diarrhea, no ear pain, no eye pain, no fever, no nausea, no seizures, no sore throat and no vomiting     Past Medical History:  Diagnosis Date   Anxiety    Arthritis    Cervical syndrome    CKD (chronic kidney disease), stage III (HCC)    Constipation    COVID-19 03/26/2019   Family history of colon cancer 02/15/2020   Fatty liver    GERD (gastroesophageal reflux disease)    Granulosa cell tumor of ovary    History of hiatal hernia    Hyperlipidemia    Joint pain    Neck pain    Obesity    Osteoarthritis    Ovarian cancer (HCC)    PONV (postoperative nausea and vomiting)    history of n/v,  past surgeries no n/v   Sleep apnea    uses CPAP   Tremor     Patient Active Problem List   Diagnosis Date Noted   Generalized obesity- Start BMI 33.73 08/22/2022   BMI 29.0-29.9,adult Current BMI 29.9 08/22/2022   Class 1 obesity with serious comorbidity and body mass index (BMI) of 33.0 to 33.9 in adult 04/24/2022   Other  constipation 03/26/2022   Dysuria 02/14/2022   Stable angina (HCC) 08/30/2021   Mouth sore secondary to chemotherapy 07/02/2021   Allergy to multiple antibiotics 05/11/2021   Urinary tract infection 05/07/2021   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   Neck pain 02/23/2021   Tremor 02/23/2021   Skin rash 12/27/2020   Elevated liver enzymes 11/07/2020   Anxiety about health 11/01/2020   Cervical pain (neck) 10/17/2020   Hot flashes 09/21/2020   History of vitamin D deficiency 08/28/2020   Depression 08/28/2020   At risk for dehydration 08/28/2020   Abdominal pain 08/28/2020   Weight gain 07/06/2020   Joint pain 07/06/2020   Elevated BP without diagnosis of hypertension 04/26/2020   Osteoporosis 04/25/2020   Leukocytosis 03/01/2020   Genetic testing 02/23/2020   Family history of colon cancer 02/15/2020   Pain, dental 01/21/2020   Other hyperlipidemia, mixed 11/30/2019   Prediabetes 11/04/2019   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   Poor venous access 08/12/2019   Pancytopenia, acquired (HCC) 08/05/2019   Cellulitis 07/26/2019   CKD (chronic kidney disease), stage III (HCC)    Goals of care, counseling/discussion 06/08/2019   Back pain, chronic 06/08/2019  Insulin resistance 05/25/2018   Vitamin D deficiency 05/25/2018   Class 1 obesity with serious comorbidity and body mass index (BMI) of 31.0 to 31.9 in adult 05/25/2018   Obstructive sleep apnea 08/18/2017   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    Past Surgical History:  Procedure Laterality Date   ABDOMINAL HYSTERECTOMY     TAH/BSO   APPENDECTOMY     EXPLORATORY LAPAROTOMY     for bowel obstruction   IR IMAGING GUIDED PORT INSERTION  10/25/2020   IR REMOVAL TUN ACCESS W/ PORT W/O FL MOD SED  06/19/2021   left toe surgery Left    Secondary tumor debulking  2002   SHOULDER SURGERY     2017 left   Tumor debulking  2010   VENTRAL HERNIA REPAIR      OB History      Gravida  3   Para      Term      Preterm      AB      Living  2      SAB      IAB      Ectopic      Multiple      Live Births               Home Medications    Prior to Admission medications   Medication Sig Start Date End Date Taking? Authorizing Provider  oseltamivir (TAMIFLU) 75 MG capsule Take 1 capsule (75 mg total) by mouth every 12 (twelve) hours. 07/07/23  Yes Prescilla Sours, FNP  acetaminophen (TYLENOL) 500 MG tablet Take 1,000 mg by mouth every 6 (six) hours as needed for moderate pain.    [provider]  albuterol (VENTOLIN HFA) 108 (90 Base) MCG/ACT inhaler Inhale 2 puffs into the lungs every 4 (four) hours as needed for wheezing or shortness of breath. 07/07/23   Prescilla Sours, FNP  anastrozole (ARIMIDEX) 1 MG tablet Take 1 tablet (1 mg total) by mouth daily. 03/27/23   Artis Delay, MD  aspirin EC 81 MG tablet Take 81 mg by mouth daily. Swallow whole.    [provider]  Biotin 5 MG TABS Take 5 mg by mouth daily.    [provider]  buPROPion (WELLBUTRIN SR) 200 MG 12 hr tablet Take 1 tablet (200 mg total) by mouth 2 (two) times daily. 06/05/23 12/02/23  Rayburn, Fanny Bien, PA-C  escitalopram (LEXAPRO) 20 MG tablet Take 1 tablet (20 mg total) by mouth daily. 02/07/23     ezetimibe (ZETIA) 10 MG tablet Take 1 tablet (10 mg total) by mouth daily. 01/08/23   Dyann Kief, PA-C  gabapentin (NEURONTIN) 300 MG capsule Take 1 capsule (300 mg total) by mouth 2 (two) times daily. 08/20/22   Artis Delay, MD  leuprolide (LUPRON) 30 MG injection Inject 30 mg into the muscle every 30 (thirty) days.    [provider]  metFORMIN (GLUCOPHAGE) 500 MG tablet Take 1 tablet (500 mg total) by mouth every morning AND 2 tablets (1,000 mg total) every evening. Needs office visit. 06/05/23   Rayburn, Fanny Bien, PA-C  mirabegron ER (MYRBETRIQ) 50 MG TB24 tablet Take 1 tablet (50 mg total) by mouth daily. 06/11/23     omeprazole  (PRILOSEC) 20 MG capsule Take 1 capsule (20 mg total) by mouth daily. 02/07/23     Polyethyl Glycol-Propyl Glycol (SYSTANE OP) Place 1 drop into both eyes daily as needed (dry eyes).    [provider]  Probiotic Product (ALIGN PO) Take 1 tablet by mouth daily.     [provider]  promethazine-dextromethorphan (PROMETHAZINE-DM) 6.25-15 MG/5ML syrup Take 5 mLs by mouth 4 (four) times daily as needed for cough. Do not use and drive - May make drowsy. 07/07/23   Prescilla Sours, FNP  psyllium (METAMUCIL) 58.6 % powder Take 1 packet by mouth 2 (two) times daily.    [provider]  Spacer/Aero-Holding Chambers (COMPACT SPACE CHAMBER) DEVI Use with the albuterol inhaler 07/07/23   Prescilla Sours, FNP  tretinoin (RETIN-A) 0.025 % cream Apply 1 application to the face Nightly. 12/18/22     triamcinolone cream (KENALOG) 0.1 % Apply 1 small Application topically 2 (two) times daily to affected area. 12/03/22     Vitamin D, Ergocalciferol, (DRISDOL) 1.25 MG (50000 UNIT) CAPS capsule Take 1 capsule by mouth every 10 days. 03/19/23   Rayburn, Fanny Bien, PA-C    Family History Family History  Problem Relation Age of Onset   Basal cell carcinoma Mother        86s   Thyroid disease Mother    Stroke Father    Colon cancer Father 26   Hypertension Father    Heart disease Father    Colon cancer Paternal Uncle        dx 70s   Colon cancer Cousin        maternal cousin; dx 78s   BRCA 1/2 Neg Hx    Breast cancer Neg Hx     Social History Social History   Tobacco Use   Smoking status: Never   Smokeless tobacco: Never  Vaping Use   Vaping status: Never Used  Substance Use Topics   Alcohol use: Not Currently   Drug use: No     Allergies   Codeine, Thimerosal (thiomersal), Ciprofloxacin, Daypro [oxaprozin], Levofloxacin, Stadol [butorphanol tartrate], and Sulfa antibiotics   Review of Systems Review of Systems  Constitutional:  Positive for chills and diaphoresis.  Negative for fever.  HENT:  Positive for congestion. Negative for ear pain and sore throat.   Eyes:  Negative for pain and visual disturbance.  Respiratory:  Positive for cough. Negative for shortness of breath.   Cardiovascular:  Negative for chest pain and palpitations.  Gastrointestinal:  Negative for abdominal pain, constipation, diarrhea, nausea and vomiting.  Genitourinary:  Negative for dysuria and hematuria.  Musculoskeletal:  Negative for arthralgias and back pain.  Skin:  Negative for color change and rash.  Neurological:  Positive for headaches. Negative for seizures and syncope.  All other systems reviewed and are negative.    Physical Exam Triage Vital Signs ED Triage Vitals [07/07/23 1832]  Encounter Vitals Group     BP      Systolic BP Percentile      Diastolic BP Percentile      Pulse      Resp      Temp      Temp src      SpO2      Weight      Height      Head Circumference      Peak Flow      Pain Score 8     Pain Loc      Pain Education      Exclude from Growth Chart    No data found.  Updated Vital Signs BP 115/64   Pulse 65   Temp 98.7 F (37.1 C) (Oral)   Resp 18   SpO2 94%  Visual Acuity Right Eye Distance:   Left Eye Distance:   Bilateral Distance:    Right Eye Near:   Left Eye Near:    Bilateral Near:     Physical Exam Vitals and nursing note reviewed.  Constitutional:      General: She is not in acute distress.    Appearance: She is well-developed. She is ill-appearing. She is not toxic-appearing.  HENT:     Head: Normocephalic and atraumatic.     Right Ear: Hearing, tympanic membrane, ear canal and external ear normal.     Left Ear: Hearing, tympanic membrane, ear canal and external ear normal.     Nose: Congestion and rhinorrhea present. Rhinorrhea is clear.     Right Sinus: No maxillary sinus tenderness or frontal sinus tenderness.     Left Sinus: No maxillary sinus tenderness or frontal sinus tenderness.      Mouth/Throat:     Lips: Pink.     Mouth: Mucous membranes are moist.     Pharynx: Uvula midline. No oropharyngeal exudate or posterior oropharyngeal erythema.     Tonsils: No tonsillar exudate.  Eyes:     Conjunctiva/sclera: Conjunctivae normal.     Pupils: Pupils are equal, round, and reactive to light.  Cardiovascular:     Rate and Rhythm: Normal rate and regular rhythm.     Heart sounds: S1 normal and S2 normal. No murmur heard. Pulmonary:     Effort: Pulmonary effort is normal. No respiratory distress.     Breath sounds: Examination of the right-upper field reveals rhonchi. Examination of the left-upper field reveals rhonchi. Examination of the right-middle field reveals rhonchi. Examination of the left-middle field reveals rhonchi. Examination of the right-lower field reveals decreased breath sounds. Examination of the left-lower field reveals decreased breath sounds. Decreased breath sounds and rhonchi present. No wheezing or rales.  Abdominal:     General: Bowel sounds are normal.     Palpations: Abdomen is soft.     Tenderness: There is no abdominal tenderness.  Musculoskeletal:        General: No swelling.     Cervical back: Neck supple.  Lymphadenopathy:     Head:     Right side of head: No submental, submandibular, tonsillar, preauricular or posterior auricular adenopathy.     Left side of head: No submental, submandibular, tonsillar, preauricular or posterior auricular adenopathy.     Cervical: No cervical adenopathy.     Right cervical: No superficial cervical adenopathy.    Left cervical: No superficial cervical adenopathy.  Skin:    General: Skin is warm and moist.     Capillary Refill: Capillary refill takes less than 2 seconds.     Findings: No rash.     Comments: Patient is diaphoretic and damp all over but she is afebrile.  Neurological:     Mental Status: She is alert and oriented to person, place, and time.  Psychiatric:        Mood and Affect: Mood normal.       UC Treatments / Results  Labs (all labs ordered are listed, but only abnormal results are displayed) Labs Reviewed  POC COVID19/FLU A&B COMBO - Abnormal; Notable for the following components:      Result Value   Influenza A Antigen, POC Positive (*)    All other components within normal limits    EKG   Radiology No results found.  Procedures Procedures (including critical care time)  Medications Ordered in UC Medications - No data  to display  Initial Impression / Assessment and Plan / UC Course  I have reviewed the triage vital signs and the nursing notes.  Pertinent labs & imaging results that were available during my care of the patient were reviewed by me and considered in my medical decision making (see chart for details).     Note corrected after AVS and printed.  Patient reports that her symptoms actually started on Saturday, 07/05/2023.  She is within a timeframe that she could benefit from Tamiflu.  Will treat the flu with Tamiflu, 75 mg twice daily for 5 days.  She also has early bronchitis.  Will treat with albuterol inhaler, 2 puffs, every 4 hours if needed for wheezing.  Encouraged to use a spacer with the inhaler.  She has a viral bronchitis based on her chest x-ray history and exam findings.  Will update her if the radiology review determine she has pneumonia.  Get plenty of fluids and rest.  Follow-up if symptoms do not improve, worsen or new symptoms occur.  Work excuse provided. Final Clinical Impressions(s) / UC Diagnoses   Final diagnoses:  Acute cough  Acute viral bronchitis     Discharge Instructions      Patient is positive for influenza type A.  Will treat with Tamiflu, 75 mg, twice daily for 5 days.  Also provided an albuterol inhaler, 2 puffs, every 4 hours as needed for wheezing/early bronchitis.  Encouraged use of the spacer with the inhaler.  Promethazine DM, 5 mL, every 6 hours if needed for cough.  Chest x-ray appears negative.  Will  update the patient if the radiology review provides a different chest x-ray report.  Reminded that if she gets getting worse not better she might need to be reevaluated for pneumonia.  Work excuse provided.  Follow-up if symptoms do not improve, worsen or new symptoms occur.     ED Prescriptions     Medication Sig Dispense Auth. Provider   promethazine-dextromethorphan (PROMETHAZINE-DM) 6.25-15 MG/5ML syrup  (Status: Discontinued) Take 5 mLs by mouth 4 (four) times daily as needed for cough. Do not use and drive - May make drowsy. 118 mL Prescilla Sours, FNP   albuterol (VENTOLIN HFA) 108 (90 Base) MCG/ACT inhaler  (Status: Discontinued) Inhale 2 puffs into the lungs every 4 (four) hours as needed for wheezing or shortness of breath. 1 each Prescilla Sours, FNP   Spacer/Aero-Holding Chambers (COMPACT SPACE CHAMBER) DEVI  (Status: Discontinued) Use with the albuterol inhaler 1 each Prescilla Sours, FNP   albuterol (VENTOLIN HFA) 108 (90 Base) MCG/ACT inhaler Inhale 2 puffs into the lungs every 4 (four) hours as needed for wheezing or shortness of breath. 1 each Prescilla Sours, FNP   promethazine-dextromethorphan (PROMETHAZINE-DM) 6.25-15 MG/5ML syrup Take 5 mLs by mouth 4 (four) times daily as needed for cough. Do not use and drive - May make drowsy. 118 mL Prescilla Sours, FNP   Spacer/Aero-Holding Chambers (COMPACT SPACE CHAMBER) DEVI Use with the albuterol inhaler 1 each Prescilla Sours, FNP   oseltamivir (TAMIFLU) 75 MG capsule Take 1 capsule (75 mg total) by mouth every 12 (twelve) hours. 10 capsule Prescilla Sours, FNP      PDMP not reviewed this encounter.   Prescilla Sours, FNP 07/07/23 1913    Prescilla Sours, FNP 07/07/23 1914    Prescilla Sours, FNP 07/07/23 1919

## 2023-07-07 NOTE — ED Triage Notes (Signed)
 Patient presents with c/o non productive cough, headache and sinus pressure x 2 days. Patient states she was here yesterday and was tested for flu and Covid which was negative. Patient sates she feels worse today.

## 2023-07-07 NOTE — Discharge Instructions (Addendum)
 Patient is positive for influenza type A.  Will treat with Tamiflu, 75 mg, twice daily for 5 days.  Also provided an albuterol inhaler, 2 puffs, every 4 hours as needed for wheezing/early bronchitis.  Encouraged use of the spacer with the inhaler.  Promethazine DM, 5 mL, every 6 hours if needed for cough.  Chest x-ray appears negative.  Will update the patient if the radiology review provides a different chest x-ray report.  Reminded that if she gets getting worse not better she might need to be reevaluated for pneumonia.  Work excuse provided.  Follow-up if symptoms do not improve, worsen or new symptoms occur.

## 2023-07-08 ENCOUNTER — Telehealth (HOSPITAL_BASED_OUTPATIENT_CLINIC_OR_DEPARTMENT_OTHER): Payer: Self-pay

## 2023-07-08 NOTE — Progress Notes (Signed)
 Chest X-Ray IMPRESSION:  No active cardiopulmonary disease.  Patient was given these results during her visit.

## 2023-07-09 ENCOUNTER — Telehealth (HOSPITAL_BASED_OUTPATIENT_CLINIC_OR_DEPARTMENT_OTHER): Payer: Self-pay

## 2023-07-10 ENCOUNTER — Encounter (INDEPENDENT_AMBULATORY_CARE_PROVIDER_SITE_OTHER): Payer: 59 | Admitting: Ophthalmology

## 2023-07-17 ENCOUNTER — Inpatient Hospital Stay: Payer: 59

## 2023-07-17 ENCOUNTER — Inpatient Hospital Stay: Payer: 59 | Admitting: Hematology and Oncology

## 2023-07-22 ENCOUNTER — Inpatient Hospital Stay: Payer: 59

## 2023-07-22 ENCOUNTER — Inpatient Hospital Stay: Payer: 59 | Admitting: Hematology and Oncology

## 2023-07-23 ENCOUNTER — Encounter (INDEPENDENT_AMBULATORY_CARE_PROVIDER_SITE_OTHER): Payer: Self-pay

## 2023-07-23 ENCOUNTER — Encounter (INDEPENDENT_AMBULATORY_CARE_PROVIDER_SITE_OTHER): Admitting: Ophthalmology

## 2023-07-23 DIAGNOSIS — M25561 Pain in right knee: Secondary | ICD-10-CM | POA: Diagnosis not present

## 2023-07-23 NOTE — Progress Notes (Unsigned)
 SUBJECTIVE: Discussed the use of AI scribe software for clinical note transcription with the patient, who gave verbal consent to proceed.  Chief Complaint: Obesity  Interim History: She is down 1 lb since her last visit.   Gretel is here to discuss her progress with her obesity treatment plan. She is on the  1500 calories and 90 grams of protein and Pescatarian Plan. and states she is following her eating plan approximately 70 % of the time. She states she is not exercising due to plantar fasciitis.   DOMINIKA LOSEY is a 68 year old female with obesity who presents for follow-up of her obesity treatment plan.  Muscle mass has been maintained, she has lost a pound in adipose tissue, and her visceral fat rating remains stable. She is working on her protein intake, starting her day with a protein shake that includes 30 grams of protein from a combination of a capsule, [employer] milk, and [place] yogurt. Lunch is challenging, and she is trying to plan better meals.  She is managing her prediabetes with metformin, but is experiencing GI distress with increased dosing. Her last insulin level was 17, indicating insulin resistance. She is currently taking metformin, bupropion, and vitamin D. She rotates her metformin intake, taking one pill during the day and two at night, to manage gastrointestinal side effects. Bupropion has been helpful. She takes vitamin D every other week. She has not been using topiramate regularly but has some available and is considering starting it at 25 mg at bedtime to help decrease cravings.   She has been experiencing plantar fasciitis for the past three months, which has limited her physical activity. She has been doing stretches, and the condition has resolved recently. She also reports issues with her knee, describing it as 'buckling' and 'subluxing,' which causes her knee to give out. She has consulted with Dr. Renaye Rakers regarding this issue and is anticipating having surgery.    Energy levels are 'pretty good'. No history of thyroid disease. She works four days a week and plans to reduce her work schedule if her next scan is stable. She is planning a vacation in July and is considering reducing her work hours to two days one week and three days the next.   OBJECTIVE: Visit Diagnoses: Problem List Items Addressed This Visit     Vitamin D deficiency   Relevant Medications   Vitamin D, Ergocalciferol, (DRISDOL) 1.25 MG (50000 UNIT) CAPS capsule   Other Relevant Orders   VITAMIN D 25 Hydroxy (Vit-D Deficiency, Fractures)   Prediabetes   Relevant Medications   metFORMIN (GLUCOPHAGE) 500 MG tablet   Other Relevant Orders   Hemoglobin A1c   Insulin, random   Other hyperlipidemia, mixed   Relevant Orders   Lipid Panel With LDL/HDL Ratio   Depression   Relevant Medications   buPROPion (WELLBUTRIN SR) 200 MG 12 hr tablet   Generalized obesity- Start BMI 33.73   Relevant Medications   metFORMIN (GLUCOPHAGE) 500 MG tablet   Other Visit Diagnoses       SOBOE (shortness of breath on exertion)    -  Primary     Fatigue, unspecified type       Relevant Orders   Vitamin B12   TSH     Obesity Jamiria Counce is undergoing obesity management, having lost one pound of adipose tissue while maintaining muscle mass. The focus is on reducing visceral fat and increasing protein intake. Her activity level has been severely limited by plantar  fasciitis which is finally improving.  She struggles with lunch meal and protein intake and we discussed strategies to make lunch meal planning/prepping easier. - Repeat indirect calorimetry in future  - Encourage increased protein intake - Recommend [employer] website for meal planning  Prediabetes Aelyn has prediabetes and insulin resistance. Her last insulin level was 17, with a target of 5 or less. Monitoring fasting insulin levels is crucial to assess the impact of metformin on insulin resistance. Insulin  Resistance Last fasting insulin was 17.4. A1c was 5.7- not at goal. Polyphagia:Yes Medication(s):  metformin  Some GI distress with increased frequency of dosing.  Lab Results  Component Value Date   HGBA1C 5.7 (H) 10/03/2022   HGBA1C 5.5 02/12/2022   HGBA1C 5.4 07/09/2021   HGBA1C 5.2 11/30/2019   HGBA1C 5.3 05/31/2019   Lab Results  Component Value Date   INSULIN 17.4 02/12/2022   INSULIN 17.1 07/09/2021   INSULIN 23.8 11/30/2019   INSULIN 16.5 05/31/2019   INSULIN 17.6 11/19/2018    Plan: Change to  metformin 1000 mg with evening meal and monitor GI closely.  She is having labs done for follow up at the cancer center next week.  Continue working on nutrition plan to decrease simple carbohydrates, increase lean proteins and exercise to promote weight loss, improve glycemic control and prevent progression to Type 2 diabetes.  - Order fasting insulin level next week - Continue metformin 1000 mg at evening meal - Monitor for gastrointestinal side effects of metformin  Emotional Eating Sayuri experiences emotional eating. Bupropion has been helpful in managing cravings, and topiramate may assist with cravings for sugary foods. - Continue bupropion 200 mg twice daily - Start topiramate 25 mg at bedtime- She already has supply and will start with this and see if it is beneficial for cravings.  She will monitor and may need refill in interim if beneficial.   Meds ordered this encounter  Medications   Vitamin D, Ergocalciferol, (DRISDOL) 1.25 MG (50000 UNIT) CAPS capsule    Sig: Take 1 capsule by mouth every 10 days.    Dispense:  9 capsule    Refill:  1   buPROPion (WELLBUTRIN SR) 200 MG 12 hr tablet    Sig: Take 1 tablet (200 mg total) by mouth 2 (two) times daily.    Dispense:  180 tablet    Refill:  1    90 d supply; ov for RF   metFORMIN (GLUCOPHAGE) 500 MG tablet    Sig: Take 2 tablets (1,000 mg total) by mouth daily with supper.    Dispense:  180 tablet    Refill:  3     Hyperlipidemia Kerrianne has hyperlipidemia. Monitoring lipid levels is essential for assessing cardiovascular risk and adjusting treatment as necessary. Taking Zetia. No side effects Lab Results  Component Value Date   CHOL 203 (H) 05/14/2023   CHOL 170 01/07/2023   CHOL 255 (H) 07/09/2021   Lab Results  Component Value Date   HDL 69 05/14/2023   HDL 60 01/07/2023   HDL 50 07/09/2021   Lab Results  Component Value Date   LDLCALC 113 (H) 05/14/2023   LDLCALC 89 01/07/2023   LDLCALC 182 (H) 07/09/2021   Lab Results  Component Value Date   TRIG 118 05/14/2023   TRIG 119 01/07/2023   TRIG 126 07/09/2021   Lab Results  Component Value Date   CHOLHDL 2.9 05/14/2023   CHOLHDL 2.8 01/07/2023   No results found for: "  LDLDIRECT" Continue to work on nutrition plan -decreasing simple carbohydrates, increasing lean proteins, decreasing saturated fats and cholesterol , avoiding trans fats and exercise as able to promote weight loss, improve lipids and decrease cardiovascular risks. Following mostly Pescatarian plan.  - Order lipid panel next week with other labs- orders placed in system and patient instructed to fast for these labs.   Vitamin D Deficiency Rodina has vitamin D deficiency and is on supplementation- Ergocalciferol 50,000 units once every other week. Monitoring vitamin D levels ensures adequate supplementation and prevents deficiency-related complications.No N/V or muscle weakness with Ergo.  Last vitamin D Lab Results  Component Value Date   VD25OH 75.6 10/03/2022   Low vitamin D levels can be associated with adiposity and may result in leptin resistance and weight gain. Also associated with fatigue.  Currently on vitamin D supplementation without any adverse effects such as nausea, vomiting or muscle weakness.  - Order vitamin D level next week - Continue vitamin D 50,000 units every other week   Fatigue Multi factorial.  Plan: Recheck labs next week at cancer  center including TSH, vitamin D and B 12 and CBC.    Plantar Fasciitis Malesha's plantar fasciitis resolved with stretching exercises. A resting splint was discussed to prevent recurrence, especially after prolonged activity. - Recommend continued stretching exercises - Consider using a resting splint for plantar fasciitis  Knee Subluxation Tiffny reports knee subluxation with buckling and plans to have her patella fixed to address the subluxation and improve stability. - Follow up with Dr. Renaye Rakers for knee surgery   Vitals Temp: (!) 97.5 F (36.4 C) BP: 113/66 Pulse Rate: 64 SpO2: 99 %   Anthropometric Measurements Height: 5\' 6"  (1.676 m) Weight: 189 lb (85.7 kg) BMI (Calculated): 30.52 Weight at Last Visit: 190 lb Weight Lost Since Last Visit: 1 lb Weight Gained Since Last Visit: 0 Starting Weight: 209 lb Total Weight Loss (lbs): 20 lb (9.072 kg) Peak Weight: 215 lb   Body Composition  Body Fat %: 41 % Fat Mass (lbs): 77.6 lbs Muscle Mass (lbs): 106.2 lbs Total Body Water (lbs): 71 lbs Visceral Fat Rating : 11   Other Clinical Data Fasting: no Labs: yes Today's Visit #: 26 Starting Date: 04/29/18     ASSESSMENT AND PLAN:  Diet: Nissa is currently in the action stage of change. As such, her goal is to continue with weight loss efforts. She has agreed to [employer].  Exercise: Shandora has been instructed to work up to a goal of 150 minutes of combined cardio and strengthening exercise per week for weight loss and overall health benefits.   Behavior Modification:  We discussed the following Behavioral Modification Strategies today: increasing lean protein intake, decreasing simple carbohydrates, increasing vegetables, increase H2O intake, increase high fiber foods, meal planning and cooking strategies, emotional eating strategies , avoiding temptations, and planning for success. We discussed various medication options to help Ahmya with her weight loss  efforts and we both agreed to continue current plan for medical weight loss.  Return in about 4 months (around 11/23/2023).Marland Kitchen She was informed of the importance of frequent follow up visits to maximize her success with intensive lifestyle modifications for her multiple health conditions.  Attestation Statements:   Reviewed by clinician on day of visit: allergies, medications, problem list, medical history, surgical history, family history, social history, and previous encounter notes.   Time spent on visit including pre-visit chart review and post-visit care and charting was 43 minutes.  Shanielle Correll, PA-C

## 2023-07-24 ENCOUNTER — Other Ambulatory Visit: Payer: Self-pay

## 2023-07-24 ENCOUNTER — Encounter (INDEPENDENT_AMBULATORY_CARE_PROVIDER_SITE_OTHER): Payer: Self-pay | Admitting: Physician Assistant

## 2023-07-24 ENCOUNTER — Ambulatory Visit (INDEPENDENT_AMBULATORY_CARE_PROVIDER_SITE_OTHER): Payer: 59 | Admitting: Physician Assistant

## 2023-07-24 ENCOUNTER — Other Ambulatory Visit (HOSPITAL_COMMUNITY): Payer: Self-pay

## 2023-07-24 VITALS — BP 113/66 | HR 64 | Temp 97.5°F | Ht 66.0 in | Wt 189.0 lb

## 2023-07-24 DIAGNOSIS — R0602 Shortness of breath: Secondary | ICD-10-CM | POA: Diagnosis not present

## 2023-07-24 DIAGNOSIS — R7303 Prediabetes: Secondary | ICD-10-CM

## 2023-07-24 DIAGNOSIS — F32A Depression, unspecified: Secondary | ICD-10-CM | POA: Diagnosis not present

## 2023-07-24 DIAGNOSIS — Z6830 Body mass index (BMI) 30.0-30.9, adult: Secondary | ICD-10-CM | POA: Diagnosis not present

## 2023-07-24 DIAGNOSIS — E559 Vitamin D deficiency, unspecified: Secondary | ICD-10-CM | POA: Diagnosis not present

## 2023-07-24 DIAGNOSIS — R5383 Other fatigue: Secondary | ICD-10-CM | POA: Diagnosis not present

## 2023-07-24 DIAGNOSIS — F5089 Other specified eating disorder: Secondary | ICD-10-CM | POA: Diagnosis not present

## 2023-07-24 DIAGNOSIS — E7849 Other hyperlipidemia: Secondary | ICD-10-CM

## 2023-07-24 DIAGNOSIS — E669 Obesity, unspecified: Secondary | ICD-10-CM

## 2023-07-24 DIAGNOSIS — F3289 Other specified depressive episodes: Secondary | ICD-10-CM

## 2023-07-24 MED ORDER — METFORMIN HCL 500 MG PO TABS
1000.0000 mg | ORAL_TABLET | Freq: Every day | ORAL | 3 refills | Status: AC
  Filled 2023-07-24: qty 180, 90d supply, fill #0
  Filled 2024-02-26: qty 180, 90d supply, fill #1

## 2023-07-24 MED ORDER — BUPROPION HCL ER (SR) 200 MG PO TB12
200.0000 mg | ORAL_TABLET | Freq: Two times a day (BID) | ORAL | 1 refills | Status: DC
Start: 2023-07-24 — End: 2024-02-27
  Filled 2023-07-24: qty 180, 90d supply, fill #0

## 2023-07-24 MED ORDER — VITAMIN D (ERGOCALCIFEROL) 1.25 MG (50000 UNIT) PO CAPS
50000.0000 [IU] | ORAL_CAPSULE | ORAL | 1 refills | Status: AC
  Filled 2023-07-24: qty 9, 90d supply, fill #0
  Filled 2024-02-26: qty 9, 90d supply, fill #1

## 2023-07-28 DIAGNOSIS — R7303 Prediabetes: Secondary | ICD-10-CM | POA: Diagnosis not present

## 2023-07-28 DIAGNOSIS — R300 Dysuria: Secondary | ICD-10-CM | POA: Diagnosis not present

## 2023-07-28 DIAGNOSIS — K625 Hemorrhage of anus and rectum: Secondary | ICD-10-CM | POA: Diagnosis not present

## 2023-07-28 DIAGNOSIS — E7800 Pure hypercholesterolemia, unspecified: Secondary | ICD-10-CM | POA: Diagnosis not present

## 2023-07-28 DIAGNOSIS — M722 Plantar fascial fibromatosis: Secondary | ICD-10-CM | POA: Diagnosis not present

## 2023-07-28 NOTE — Assessment & Plan Note (Signed)
 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  She had relapse in 2002, resected followed by 6 cycles of cisplatin and etoposide and then have disease relapse again in 2010 Pathology: Granulosa cell tumor of the ovary, 2018 - Core biopsy for ER/PR and Foundation One testing performed. Unfortunately, no sufficient tissue for Foundation One testing. ER was 50% and PR 90%. Between 2010-2025, Progressed on carboplatin, exemestane, Avastin,Tamoxifen/Megace and letrozole, mixed response on Lupron  Her treatment course is stabilized currently on combination of Lupron monthly and daily anastrozole I recommend repeat imaging study next month in May for further objective assessment of response to therapy

## 2023-07-29 ENCOUNTER — Inpatient Hospital Stay (HOSPITAL_BASED_OUTPATIENT_CLINIC_OR_DEPARTMENT_OTHER): Payer: 59 | Admitting: Hematology and Oncology

## 2023-07-29 ENCOUNTER — Inpatient Hospital Stay: Payer: 59

## 2023-07-29 ENCOUNTER — Inpatient Hospital Stay: Payer: 59 | Attending: Hematology

## 2023-07-29 ENCOUNTER — Telehealth (INDEPENDENT_AMBULATORY_CARE_PROVIDER_SITE_OTHER): Payer: Self-pay | Admitting: Physician Assistant

## 2023-07-29 ENCOUNTER — Telehealth: Payer: Self-pay | Admitting: *Deleted

## 2023-07-29 ENCOUNTER — Other Ambulatory Visit (INDEPENDENT_AMBULATORY_CARE_PROVIDER_SITE_OTHER): Payer: Self-pay | Admitting: Physician Assistant

## 2023-07-29 ENCOUNTER — Encounter: Payer: Self-pay | Admitting: Hematology and Oncology

## 2023-07-29 VITALS — BP 137/63 | HR 72 | Resp 18 | Ht 66.0 in | Wt 194.2 lb

## 2023-07-29 DIAGNOSIS — R5383 Other fatigue: Secondary | ICD-10-CM | POA: Diagnosis not present

## 2023-07-29 DIAGNOSIS — K5900 Constipation, unspecified: Secondary | ICD-10-CM | POA: Diagnosis not present

## 2023-07-29 DIAGNOSIS — C569 Malignant neoplasm of unspecified ovary: Secondary | ICD-10-CM

## 2023-07-29 DIAGNOSIS — N183 Chronic kidney disease, stage 3 unspecified: Secondary | ICD-10-CM | POA: Insufficient documentation

## 2023-07-29 DIAGNOSIS — R635 Abnormal weight gain: Secondary | ICD-10-CM

## 2023-07-29 DIAGNOSIS — E559 Vitamin D deficiency, unspecified: Secondary | ICD-10-CM | POA: Diagnosis not present

## 2023-07-29 DIAGNOSIS — R7303 Prediabetes: Secondary | ICD-10-CM | POA: Diagnosis not present

## 2023-07-29 DIAGNOSIS — E7849 Other hyperlipidemia: Secondary | ICD-10-CM | POA: Diagnosis not present

## 2023-07-29 LAB — CBC WITH DIFFERENTIAL/PLATELET
Abs Immature Granulocytes: 0.01 10*3/uL (ref 0.00–0.07)
Basophils Absolute: 0 10*3/uL (ref 0.0–0.1)
Basophils Relative: 1 %
Eosinophils Absolute: 0.2 10*3/uL (ref 0.0–0.5)
Eosinophils Relative: 4 %
HCT: 38.9 % (ref 36.0–46.0)
Hemoglobin: 12.9 g/dL (ref 12.0–15.0)
Immature Granulocytes: 0 %
Lymphocytes Relative: 30 %
Lymphs Abs: 1.7 10*3/uL (ref 0.7–4.0)
MCH: 27.2 pg (ref 26.0–34.0)
MCHC: 33.2 g/dL (ref 30.0–36.0)
MCV: 81.9 fL (ref 80.0–100.0)
Monocytes Absolute: 0.6 10*3/uL (ref 0.1–1.0)
Monocytes Relative: 11 %
Neutro Abs: 3.1 10*3/uL (ref 1.7–7.7)
Neutrophils Relative %: 54 %
Platelets: 307 10*3/uL (ref 150–400)
RBC: 4.75 MIL/uL (ref 3.87–5.11)
RDW: 14.2 % (ref 11.5–15.5)
WBC: 5.7 10*3/uL (ref 4.0–10.5)
nRBC: 0 % (ref 0.0–0.2)

## 2023-07-29 LAB — COMPREHENSIVE METABOLIC PANEL WITH GFR
ALT: 15 U/L (ref 0–44)
AST: 18 U/L (ref 15–41)
Albumin: 4.1 g/dL (ref 3.5–5.0)
Alkaline Phosphatase: 80 U/L (ref 38–126)
Anion gap: 7 (ref 5–15)
BUN: 26 mg/dL — ABNORMAL HIGH (ref 8–23)
CO2: 27 mmol/L (ref 22–32)
Calcium: 9.2 mg/dL (ref 8.9–10.3)
Chloride: 102 mmol/L (ref 98–111)
Creatinine, Ser: 1.09 mg/dL — ABNORMAL HIGH (ref 0.44–1.00)
GFR, Estimated: 56 mL/min — ABNORMAL LOW (ref >60)
Glucose, Bld: 93 mg/dL (ref 70–99)
Potassium: 3.9 mmol/L (ref 3.5–5.1)
Sodium: 136 mmol/L (ref 135–145)
Total Bilirubin: 0.6 mg/dL (ref 0.0–1.2)
Total Protein: 7 g/dL (ref 6.5–8.1)

## 2023-07-29 NOTE — Assessment & Plan Note (Addendum)
Her renal function is stable/slightly fluctuated Observe closely Discussed importance of oral fluid hydration

## 2023-07-29 NOTE — Assessment & Plan Note (Addendum)
 She had gained some weight recently She will continue dietary modification and risk factor modification

## 2023-07-29 NOTE — Addendum Note (Signed)
 Addended by: Theola Sequin on: 07/29/2023 04:23 PM   Modules accepted: Orders

## 2023-07-29 NOTE — Telephone Encounter (Signed)
 Good morning!  Needs a lab order sent via epic so that she can get them done today with other work she needs to get done. She's getting them done at he [facility].   Thanks!

## 2023-07-29 NOTE — Progress Notes (Signed)
 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  She had relapse in 2002, resected followed by 6 cycles of cisplatin and etoposide and then have disease relapse again in 2010 Pathology: Granulosa cell tumor of the ovary, 2018 - Core biopsy for ER/PR and Foundation One testing performed. Unfortunately, no sufficient tissue for Foundation One testing. ER was 50% and PR 90%. Between 2010-2025, Progressed on carboplatin, exemestane, Avastin,Tamoxifen/Megace and letrozole, mixed response on Lupron  Her treatment course is stabilized currently on combination of Lupron monthly and daily anastrozole I recommend repeat imaging study next month in May for further objective assessment of response to therapy Weight gain She had gained some weight recently She will continue dietary modification and risk factor modification Stage 3 chronic kidney disease, unspecified whether stage 3a or 3b CKD (HCC) Her renal function is stable/slightly fluctuated Observe closely Discussed importance of oral fluid hydration  Orders Placed This Encounter  Procedures   CT ABDOMEN PELVIS W CONTRAST    Standing Status:   Future    Expected Date:   08/21/2023    Expiration Date:   07/28/2024    Scheduling Instructions:     No need oral contrast    If indicated for the ordered procedure, I authorize the administration of contrast media per Radiology protocol:   Yes    Does the patient have a contrast media/X-ray dye allergy?:   No    Preferred imaging location?:   [facility]    If indicated for the ordered procedure, I authorize the administration of oral contrast media per Radiology protocol:    No    Reason for no oral contrast::   No need oral contrast     Artis Delay, MD  INTERVAL HISTORY: she returns for treatment follow-up Complications related to previous cycle of chemotherapy included  occasional hot flashes She denies abdominal pain or changes in bowel habits although she have occasional constipation   PHYSICAL EXAMINATION: ECOG PERFORMANCE STATUS: 1 - Symptomatic but completely ambulatory  Vitals:   07/29/23 1349  BP: 137/63  Pulse: 72  Resp: 18  SpO2: 100%   Filed Weights   07/29/23 1349  Weight: 194 lb 3.2 oz (88.1 kg)    Relevant data reviewed during this visit included CBC and CMP

## 2023-07-29 NOTE — Telephone Encounter (Signed)
 Per Dr Bertis Ruddy, moved appt from 4/18 to 8/1

## 2023-07-30 ENCOUNTER — Telehealth: Payer: Self-pay | Admitting: Hematology and Oncology

## 2023-07-30 ENCOUNTER — Other Ambulatory Visit: Payer: Self-pay

## 2023-07-30 ENCOUNTER — Other Ambulatory Visit (HOSPITAL_COMMUNITY): Payer: Self-pay

## 2023-07-30 DIAGNOSIS — R3914 Feeling of incomplete bladder emptying: Secondary | ICD-10-CM | POA: Diagnosis not present

## 2023-07-30 DIAGNOSIS — N3941 Urge incontinence: Secondary | ICD-10-CM | POA: Diagnosis not present

## 2023-07-30 MED ORDER — MIRABEGRON ER 50 MG PO TB24
50.0000 mg | ORAL_TABLET | Freq: Every day | ORAL | 0 refills | Status: DC
  Filled 2023-07-30: qty 30, 30d supply, fill #0

## 2023-07-30 NOTE — Telephone Encounter (Signed)
 Spoke with patient confirming upcoming appointment

## 2023-07-31 ENCOUNTER — Inpatient Hospital Stay

## 2023-07-31 VITALS — BP 121/56 | HR 55 | Temp 98.6°F | Resp 17

## 2023-07-31 DIAGNOSIS — K5900 Constipation, unspecified: Secondary | ICD-10-CM | POA: Diagnosis not present

## 2023-07-31 DIAGNOSIS — N183 Chronic kidney disease, stage 3 unspecified: Secondary | ICD-10-CM | POA: Diagnosis not present

## 2023-07-31 DIAGNOSIS — C569 Malignant neoplasm of unspecified ovary: Secondary | ICD-10-CM

## 2023-07-31 MED ORDER — LEUPROLIDE ACETATE 3.75 MG IM KIT
3.7500 mg | PACK | Freq: Once | INTRAMUSCULAR | Status: AC
  Administered 2023-07-31: 3.75 mg via INTRAMUSCULAR
  Filled 2023-07-31: qty 3.75

## 2023-08-01 ENCOUNTER — Other Ambulatory Visit: Payer: 59

## 2023-08-01 LAB — LIPID PANEL WITH LDL/HDL RATIO
Cholesterol, Total: 190 mg/dL (ref 100–199)
HDL: 59 mg/dL (ref >39)
LDL Chol Calc (NIH): 111 mg/dL — ABNORMAL HIGH (ref 0–99)
LDL/HDL Ratio: 1.9 ratio (ref 0.0–3.2)
Triglycerides: 112 mg/dL (ref 0–149)
VLDL Cholesterol Cal: 20 mg/dL (ref 5–40)

## 2023-08-01 LAB — VITAMIN B12: Vitamin B-12: 378 pg/mL (ref 232–1245)

## 2023-08-01 LAB — TSH: TSH: 0.609 u[IU]/mL (ref 0.450–4.500)

## 2023-08-01 LAB — VITAMIN D 25 HYDROXY (VIT D DEFICIENCY, FRACTURES): Vit D, 25-Hydroxy: 57.3 ng/mL (ref 30.0–100.0)

## 2023-08-01 LAB — HEMOGLOBIN A1C
Est. average glucose Bld gHb Est-mCnc: 120 mg/dL
Hgb A1c MFr Bld: 5.8 % — ABNORMAL HIGH (ref 4.8–5.6)

## 2023-08-01 LAB — INSULIN, RANDOM: INSULIN: 15.3 u[IU]/mL (ref 2.6–24.9)

## 2023-08-04 ENCOUNTER — Telehealth: Payer: Self-pay | Admitting: *Deleted

## 2023-08-04 ENCOUNTER — Other Ambulatory Visit: Payer: Self-pay

## 2023-08-04 ENCOUNTER — Other Ambulatory Visit (HOSPITAL_COMMUNITY): Payer: Self-pay

## 2023-08-04 MED ORDER — OMEPRAZOLE 20 MG PO CPDR
20.0000 mg | DELAYED_RELEASE_CAPSULE | Freq: Every day | ORAL | 2 refills | Status: AC
  Filled 2023-08-04: qty 90, 90d supply, fill #0
  Filled 2023-09-17 – 2024-02-26 (×2): qty 90, 90d supply, fill #1
  Filled 2024-05-16: qty 90, 90d supply, fill #2

## 2023-08-04 MED ORDER — ESCITALOPRAM OXALATE 20 MG PO TABS
20.0000 mg | ORAL_TABLET | Freq: Every day | ORAL | 2 refills | Status: DC
  Filled 2023-08-04: qty 90, 90d supply, fill #0

## 2023-08-04 NOTE — Telephone Encounter (Signed)
 Per provider moved appt from 8/1 to 8/8, patient aware of new date/time

## 2023-08-05 NOTE — Progress Notes (Signed)
 [facility] - Clinic Note  08/07/2023   CHIEF COMPLAINT Patient presents for Retina Follow Up  HISTORY OF PRESENT ILLNESS: [name] is a 68 y.o. female who presents to the clinic today for:  HPI     Retina Follow Up   Patient presents with  Retinal Break/Detachment.  In right eye.  Since onset it is stable.  I, the attending physician,  performed the HPI with the patient and updated documentation appropriately.        Comments   Patient here for 3 months retina follow up for retinal tear OD. Patient states vision doing pretty good. no eye pain.      Last edited by Rennis Chris, MD on 08/07/2023 12:19 PM.     Referring physician: Diona Foley, MD [address] [place],  [place] [address]  HISTORICAL INFORMATION:  Selected notes from the MEDICAL RECORD NUMBER Referred by Dr. Zenaida Niece for HST LEE:  Ocular Hx- PMH-   CURRENT MEDICATIONS: Current Outpatient Medications (Ophthalmic Drugs)  Medication Sig   Polyethyl Glycol-Propyl Glycol (SYSTANE OP) Place 1 drop into both eyes daily as needed (dry eyes).   No current facility-administered medications for this visit. (Ophthalmic Drugs)   Current Outpatient Medications (Other)  Medication Sig   acetaminophen (TYLENOL) 500 MG tablet Take 1,000 mg by mouth every 6 (six) hours as needed for moderate pain.   albuterol (VENTOLIN HFA) 108 (90 Base) MCG/ACT inhaler Inhale 2 puffs into the lungs every 4 (four) hours as needed for wheezing or shortness of breath.   anastrozole (ARIMIDEX) 1 MG tablet Take 1 tablet (1 mg total) by mouth daily.   aspirin EC 81 MG tablet Take 81 mg by mouth daily. Swallow whole.   Biotin 5 MG TABS Take 5 mg by mouth daily.   buPROPion (WELLBUTRIN SR) 200 MG 12 hr tablet Take 1 tablet (200 mg total) by mouth 2 (two) times daily.   escitalopram (LEXAPRO) 20 MG tablet Take 1 tablet (20 mg total) by mouth daily.   escitalopram (LEXAPRO) 20 MG tablet Take 1 tablet (20 mg total) by  mouth daily.   ezetimibe (ZETIA) 10 MG tablet Take 1 tablet (10 mg total) by mouth daily.   gabapentin (NEURONTIN) 300 MG capsule Take 1 capsule (300 mg total) by mouth 2 (two) times daily.   leuprolide (LUPRON) 30 MG injection Inject 30 mg into the muscle every 30 (thirty) days.   metFORMIN (GLUCOPHAGE) 500 MG tablet Take 2 tablets (1,000 mg total) by mouth daily with supper.   mirabegron ER (MYRBETRIQ) 50 MG TB24 tablet Take 1 tablet (50 mg total) by mouth daily.   omeprazole (PRILOSEC) 20 MG capsule Take 1 capsule (20 mg total) by mouth daily.   omeprazole (PRILOSEC) 20 MG capsule Take 1 capsule (20 mg total) by mouth daily.   oseltamivir (TAMIFLU) 75 MG capsule Take 1 capsule (75 mg total) by mouth every 12 (twelve) hours.   Probiotic Product (ALIGN PO) Take 1 tablet by mouth daily.    promethazine-dextromethorphan (PROMETHAZINE-DM) 6.25-15 MG/5ML syrup Take 5 mLs by mouth 4 (four) times daily as needed for cough. Do not use and drive - May make drowsy.   psyllium (METAMUCIL) 58.6 % powder Take 1 packet by mouth 2 (two) times daily.   Spacer/Aero-Holding Chambers (COMPACT SPACE CHAMBER) DEVI Use with the albuterol inhaler   tretinoin (RETIN-A) 0.025 % cream Apply 1 application to the face Nightly.   triamcinolone cream (KENALOG) 0.1 % Apply 1 small  Application topically 2 (two) times daily to affected area.   Vitamin D, Ergocalciferol, (DRISDOL) 1.25 MG (50000 UNIT) CAPS capsule Take 1 capsule by mouth every 10 days.   No current facility-administered medications for this visit. (Other)   REVIEW OF SYSTEMS: ROS   Positive for: Endocrine, Eyes Negative for: Constitutional, Gastrointestinal, Neurological, Skin, Genitourinary, Musculoskeletal, HENT, Cardiovascular, Respiratory, Psychiatric, Allergic/Imm, Heme/Lymph Last edited by Laddie Aquas, COA on 08/07/2023  7:49 AM.       ALLERGIES Allergies  Allergen Reactions   Codeine Nausea And Vomiting   Thimerosal (Thiomersal) Other  (See Comments)    Eye redness    Ciprofloxacin Other (See Comments)    FATIGUE   Daypro [Oxaprozin] Rash   Levofloxacin Other (See Comments)    fatigue   Stadol [Butorphanol Tartrate] Other (See Comments)    ALTERED MENTAL STATUS   Sulfa Antibiotics Rash   PAST MEDICAL HISTORY Past Medical History:  Diagnosis Date   Anxiety    Arthritis    Cervical syndrome    CKD (chronic kidney disease), stage III (HCC)    Constipation    COVID-19 03/26/2019   Family history of colon cancer 02/15/2020   Fatty liver    GERD (gastroesophageal reflux disease)    Granulosa cell tumor of ovary    History of hiatal hernia    Hyperlipidemia    Joint pain    Neck pain    Obesity    Osteoarthritis    Ovarian cancer (HCC)    PONV (postoperative nausea and vomiting)    history of n/v,  past surgeries no n/v   Sleep apnea    uses CPAP   Tremor    Past Surgical History:  Procedure Laterality Date   ABDOMINAL HYSTERECTOMY     TAH/BSO   APPENDECTOMY     EXPLORATORY LAPAROTOMY     for bowel obstruction   IR IMAGING GUIDED PORT INSERTION  10/25/2020   IR REMOVAL TUN ACCESS W/ PORT W/O FL MOD SED  06/19/2021   left toe surgery Left    Secondary tumor debulking  2002   SHOULDER SURGERY     2017 left   Tumor debulking  2010   VENTRAL HERNIA REPAIR     FAMILY HISTORY Family History  Problem Relation Age of Onset   Basal cell carcinoma Mother        19s   Thyroid disease Mother    Stroke Father    Colon cancer Father 49   Hypertension Father    Heart disease Father    Colon cancer Paternal Uncle        dx 75s   Colon cancer Cousin        maternal cousin; dx 76s   BRCA 1/2 Neg Hx    Breast cancer Neg Hx    SOCIAL HISTORY Social History   Tobacco Use   Smoking status: Never   Smokeless tobacco: Never  Vaping Use   Vaping status: Never Used  Substance Use Topics   Alcohol use: Not Currently   Drug use: No       OPHTHALMIC EXAM:  Base Eye Exam     Visual Acuity (Snellen -  Linear)       Right Left   Dist cc 20/40 -2 20/25    Correction: Glasses         Tonometry (Tonopen, 7:47 AM)       Right Left   Pressure 19 21  Pupils       Dark Light Shape React APD   Right 4 3 Round Brisk None   Left 4 3 Round Brisk None         Visual Fields (Counting fingers)       Left Right    Full Full         Extraocular Movement       Right Left    Full, Ortho Full, Ortho         Neuro/Psych     Oriented x3: Yes   Mood/Affect: Normal         Dilation     Both eyes: 1.0% Mydriacyl, 2.5% Phenylephrine @ 7:47 AM           Slit Lamp and Fundus Exam     Slit Lamp Exam       Right Left   Lids/Lashes Dermatochalasis - upper lid Dermatochalasis - upper lid   Conjunctiva/Sclera White and quiet White and quiet   Cornea trace PEE, trace tear film debris trace PEE   Anterior Chamber deep and clear deep and clear   Iris Round and dilated Round and dilated   Lens 2+ Nuclear sclerosis, 2+ Cortical cataract 2+ Nuclear sclerosis, 2+ Cortical cataract   Anterior Vitreous mild syneresis, no pigment, Posterior vitreous detachment, vitreous condensations mild syneresis, Posterior vitreous detachment         Fundus Exam       Right Left   Disc Pink and Sharp Pink and Sharp, mild tilt   C/D Ratio 0.4 0.3   Macula Flat, Blunted foveal reflex, ERM with striae Flat, Blunted foveal reflex   Vessels attenuated, mild tortuosity attenuated, mild tortuosity   Periphery HST at 1030 with shallow cuff of SRF anteriorly, light pigmented demarcation line posteriorly -- good laser changes surrounding; no new RT/RD Attached, No heme, focal pigmented CR atrophy at 0800 and 1030           Refraction     Wearing Rx       Sphere Cylinder Axis Add   Right -4.00 +0.50 170 +2.50   Left -4.00 +0.50 048 +2.50           IMAGING AND PROCEDURES  Imaging and Procedures for 08/07/2023  OCT, Retina - OU - Both Eyes       Right Eye Quality was  good. Central Foveal Thickness: 412. Progression has been stable. Findings include no IRF, abnormal foveal contour, epiretinal membrane, macular pucker, subretinal fluid (stable improvement in vitreous opacities, ERM with central thickening, loss of foveal contour and pucker, focal retinal break with +IRF/SRF superior periphery caught on widefield, focal ellipsoid disruption centrally -- improved).   Left Eye Quality was good. Central Foveal Thickness: 289. Progression has been stable. Findings include normal foveal contour, no IRF, no SRF.   Notes *Images captured and stored on drive  Diagnosis / Impression:  OD: stable improvement in vitreous opacities, ERM with central thickening, loss of foveal contour and pucker, focal retinal break with +IRF/SRF superior periphery caught on widefield, focal ellipsoid disruption centrally -- improved OS: NFP, no IRF/SRF  Clinical management:  See below  Abbreviations: NFP - Normal foveal profile. CME - cystoid macular edema. PED - pigment epithelial detachment. IRF - intraretinal fluid. SRF - subretinal fluid. EZ - ellipsoid zone. ERM - epiretinal membrane. ORA - outer retinal atrophy. ORT - outer retinal tubulation. SRHM - subretinal hyper-reflective material. IRHM - intraretinal hyper-reflective material  ASSESSMENT/PLAN:   ICD-10-CM   1. Retinal tear of right eye  H33.311 OCT, Retina - OU - Both Eyes    2. Right retinal detachment  H33.21     3. Epiretinal membrane (ERM) of right eye  H35.371 OCT, Retina - OU - Both Eyes    4. Combined forms of age-related cataract of both eyes  H25.813      1,2. Retinal tear with +SRF / focal retinal detachment, OD - bi-lobed retinal tear at 1000 with +cuff of SRF anteriorly, light pigmented demarcation line posteriorly - s/p laser retinopexy OD (11.13.24) -- good laser changes surrounding - no new RT/RD - f/u in 9 months, DFE, OCT  3. Epiretinal membrane, right eye  - Mild ERM with central  thickening, loss of foveal contour and pucker  - BCVA stable at 20/40 - asymptomatic, no metamorphopsia - cont monitoring - f/u 9 months DFE, OCT  4. Mixed Cataract OU - The symptoms of cataract, surgical options, and treatments and risks were discussed with patient. - discussed diagnosis and progression - monitor  Ophthalmic Meds Ordered this visit:  No orders of the defined types were placed in this encounter.    Return in about 9 months (around 05/08/2024) for f/u ERM OD, DFE, OCT.  There are no Patient Instructions on file for this visit.  Explained the diagnoses, plan, and follow up with the patient and they expressed understanding.  Patient expressed understanding of the importance of proper follow up care.   This document serves as a record of services personally performed by Jeanice Millard, MD, PhD. It was created on their behalf by Diona Franklin, COMT. The creation of this record is the provider's dictation and/or activities during the visit.  Electronically signed by: Diona Franklin, COMT 08/07/23 12:21 PM  This document serves as a record of services personally performed by Jeanice Millard, MD, PhD. It was created on their behalf by Morley Arabia. Bevin Bucks, OA an ophthalmic technician. The creation of this record is the provider's dictation and/or activities during the visit.    Electronically signed by: Morley Arabia. Bevin Bucks, OA 08/07/23 12:21 PM  Jeanice Millard, M.D., Ph.D. Diseases & Surgery of the Retina and Vitreous [facility]  I have reviewed the above documentation for accuracy and completeness, and I agree with the above. Jeanice Millard, M.D., Ph.D. 08/07/23 12:21 PM   Abbreviations: M myopia (nearsighted); A astigmatism; H hyperopia (farsighted); P presbyopia; Mrx spectacle prescription;  CTL contact lenses; OD right eye; OS left eye; OU both eyes  XT exotropia; ET esotropia; PEK punctate epithelial keratitis; PEE punctate epithelial erosions; DES dry  eye syndrome; MGD meibomian gland dysfunction; ATs artificial tears; PFAT's preservative free artificial tears; NSC nuclear sclerotic cataract; PSC posterior subcapsular cataract; ERM epi-retinal membrane; PVD posterior vitreous detachment; RD retinal detachment; DM diabetes mellitus; DR diabetic retinopathy; NPDR non-proliferative diabetic retinopathy; PDR proliferative diabetic retinopathy; CSME clinically significant macular edema; DME diabetic macular edema; dbh dot blot hemorrhages; CWS cotton wool spot; POAG primary open angle glaucoma; C/D cup-to-disc ratio; HVF humphrey visual field; GVF goldmann visual field; OCT optical coherence tomography; IOP intraocular pressure; BRVO Branch retinal vein occlusion; CRVO central retinal vein occlusion; CRAO central retinal artery occlusion; BRAO branch retinal artery occlusion; RT retinal tear; SB scleral buckle; PPV pars plana vitrectomy; VH Vitreous hemorrhage; PRP panretinal laser photocoagulation; IVK intravitreal kenalog; VMT vitreomacular traction; MH Macular hole;  NVD neovascularization of the disc; NVE neovascularization elsewhere; AREDS age related eye  disease study; ARMD age related macular degeneration; POAG primary open angle glaucoma; EBMD epithelial/anterior basement membrane dystrophy; ACIOL anterior chamber intraocular lens; IOL intraocular lens; PCIOL posterior chamber intraocular lens; Phaco/IOL phacoemulsification with intraocular lens placement; PRK photorefractive keratectomy; LASIK laser assisted in situ keratomileusis; HTN hypertension; DM diabetes mellitus; COPD chronic obstructive pulmonary disease

## 2023-08-07 ENCOUNTER — Ambulatory Visit (INDEPENDENT_AMBULATORY_CARE_PROVIDER_SITE_OTHER): Admitting: Ophthalmology

## 2023-08-07 ENCOUNTER — Encounter (INDEPENDENT_AMBULATORY_CARE_PROVIDER_SITE_OTHER): Payer: Self-pay | Admitting: Ophthalmology

## 2023-08-07 DIAGNOSIS — H3321 Serous retinal detachment, right eye: Secondary | ICD-10-CM | POA: Diagnosis not present

## 2023-08-07 DIAGNOSIS — H25813 Combined forms of age-related cataract, bilateral: Secondary | ICD-10-CM

## 2023-08-07 DIAGNOSIS — H33311 Horseshoe tear of retina without detachment, right eye: Secondary | ICD-10-CM

## 2023-08-07 DIAGNOSIS — H35371 Puckering of macula, right eye: Secondary | ICD-10-CM | POA: Diagnosis not present

## 2023-08-08 ENCOUNTER — Ambulatory Visit: Payer: 59 | Admitting: Gynecologic Oncology

## 2023-08-11 ENCOUNTER — Other Ambulatory Visit: Payer: Self-pay

## 2023-08-20 ENCOUNTER — Ambulatory Visit (HOSPITAL_COMMUNITY)
Admission: RE | Admit: 2023-08-20 | Discharge: 2023-08-20 | Disposition: A | Discharge status: Home / Self Care | Source: Ambulatory Visit | Attending: Hematology and Oncology | Admitting: Hematology and Oncology

## 2023-08-20 DIAGNOSIS — C569 Malignant neoplasm of unspecified ovary: Secondary | ICD-10-CM | POA: Insufficient documentation

## 2023-08-20 DIAGNOSIS — N281 Cyst of kidney, acquired: Secondary | ICD-10-CM | POA: Diagnosis not present

## 2023-08-20 DIAGNOSIS — C786 Secondary malignant neoplasm of retroperitoneum and peritoneum: Secondary | ICD-10-CM | POA: Diagnosis not present

## 2023-08-20 MED ORDER — IOHEXOL 300 MG/ML  SOLN
100.0000 mL | Freq: Once | INTRAMUSCULAR | Status: AC | PRN
  Administered 2023-08-20: 100 mL via INTRAVENOUS

## 2023-08-20 MED ORDER — SODIUM CHLORIDE (PF) 0.9 % IJ SOLN
INTRAMUSCULAR | Status: AC
  Filled 2023-08-20: qty 50

## 2023-08-20 MED ORDER — IOHEXOL 9 MG/ML PO SOLN
ORAL | Status: AC
  Filled 2023-08-20: qty 1000

## 2023-08-20 MED ORDER — IOHEXOL 9 MG/ML PO SOLN
1000.0000 mL | Freq: Once | ORAL | Status: AC
  Administered 2023-08-20: 1000 mL via ORAL

## 2023-08-21 ENCOUNTER — Inpatient Hospital Stay: Attending: Hematology

## 2023-08-21 DIAGNOSIS — K5909 Other constipation: Secondary | ICD-10-CM | POA: Insufficient documentation

## 2023-08-21 DIAGNOSIS — N183 Chronic kidney disease, stage 3 unspecified: Secondary | ICD-10-CM | POA: Insufficient documentation

## 2023-08-21 DIAGNOSIS — C569 Malignant neoplasm of unspecified ovary: Secondary | ICD-10-CM | POA: Insufficient documentation

## 2023-08-27 ENCOUNTER — Other Ambulatory Visit: Payer: Self-pay

## 2023-08-27 DIAGNOSIS — C569 Malignant neoplasm of unspecified ovary: Secondary | ICD-10-CM

## 2023-08-27 NOTE — Progress Notes (Unsigned)
 Orders placed for CBC, CMP. MD LOS from 07/29/23 indicates pt needs labs at 5/8 visit. Lab appt added and CBC. CMP ordered. Message forwarded to MD for awareness. Pt is aware.

## 2023-08-28 ENCOUNTER — Encounter: Payer: Self-pay | Admitting: Hematology and Oncology

## 2023-08-28 ENCOUNTER — Other Ambulatory Visit (HOSPITAL_COMMUNITY): Payer: Self-pay

## 2023-08-28 ENCOUNTER — Inpatient Hospital Stay: Admitting: Hematology and Oncology

## 2023-08-28 ENCOUNTER — Inpatient Hospital Stay

## 2023-08-28 VITALS — BP 114/62 | HR 68 | Temp 98.2°F | Resp 16 | Ht 66.0 in | Wt 194.0 lb

## 2023-08-28 DIAGNOSIS — K5909 Other constipation: Secondary | ICD-10-CM

## 2023-08-28 DIAGNOSIS — C569 Malignant neoplasm of unspecified ovary: Secondary | ICD-10-CM

## 2023-08-28 DIAGNOSIS — S83241A Other tear of medial meniscus, current injury, right knee, initial encounter: Secondary | ICD-10-CM | POA: Diagnosis not present

## 2023-08-28 DIAGNOSIS — S83281A Other tear of lateral meniscus, current injury, right knee, initial encounter: Secondary | ICD-10-CM | POA: Diagnosis not present

## 2023-08-28 DIAGNOSIS — N183 Chronic kidney disease, stage 3 unspecified: Secondary | ICD-10-CM | POA: Diagnosis not present

## 2023-08-28 DIAGNOSIS — M25561 Pain in right knee: Secondary | ICD-10-CM | POA: Diagnosis not present

## 2023-08-28 DIAGNOSIS — G8929 Other chronic pain: Secondary | ICD-10-CM | POA: Diagnosis not present

## 2023-08-28 LAB — CBC WITH DIFFERENTIAL (CANCER CENTER ONLY)
Abs Immature Granulocytes: 0.01 10*3/uL (ref 0.00–0.07)
Basophils Absolute: 0.1 10*3/uL (ref 0.0–0.1)
Basophils Relative: 1 %
Eosinophils Absolute: 0.2 10*3/uL (ref 0.0–0.5)
Eosinophils Relative: 4 %
HCT: 39.1 % (ref 36.0–46.0)
Hemoglobin: 12.8 g/dL (ref 12.0–15.0)
Immature Granulocytes: 0 %
Lymphocytes Relative: 27 %
Lymphs Abs: 1.6 10*3/uL (ref 0.7–4.0)
MCH: 27.4 pg (ref 26.0–34.0)
MCHC: 32.7 g/dL (ref 30.0–36.0)
MCV: 83.7 fL (ref 80.0–100.0)
Monocytes Absolute: 0.5 10*3/uL (ref 0.1–1.0)
Monocytes Relative: 9 %
Neutro Abs: 3.4 10*3/uL (ref 1.7–7.7)
Neutrophils Relative %: 59 %
Platelet Count: 321 10*3/uL (ref 150–400)
RBC: 4.67 MIL/uL (ref 3.87–5.11)
RDW: 13.7 % (ref 11.5–15.5)
WBC Count: 5.8 10*3/uL (ref 4.0–10.5)
nRBC: 0 % (ref 0.0–0.2)

## 2023-08-28 LAB — CMP (CANCER CENTER ONLY)
ALT: 16 U/L (ref 0–44)
AST: 19 U/L (ref 15–41)
Albumin: 4.2 g/dL (ref 3.5–5.0)
Alkaline Phosphatase: 72 U/L (ref 38–126)
Anion gap: 6 (ref 5–15)
BUN: 20 mg/dL (ref 8–23)
CO2: 29 mmol/L (ref 22–32)
Calcium: 9.4 mg/dL (ref 8.9–10.3)
Chloride: 101 mmol/L (ref 98–111)
Creatinine: 1.15 mg/dL — ABNORMAL HIGH (ref 0.44–1.00)
GFR, Estimated: 53 mL/min — ABNORMAL LOW (ref >60)
Glucose, Bld: 84 mg/dL (ref 70–99)
Potassium: 4.1 mmol/L (ref 3.5–5.1)
Sodium: 136 mmol/L (ref 135–145)
Total Bilirubin: 0.4 mg/dL (ref 0.0–1.2)
Total Protein: 6.9 g/dL (ref 6.5–8.1)

## 2023-08-28 MED ORDER — LEUPROLIDE ACETATE 3.75 MG IM KIT
3.7500 mg | PACK | Freq: Once | INTRAMUSCULAR | Status: AC
  Administered 2023-08-28: 3.75 mg via INTRAMUSCULAR
  Filled 2023-08-28: qty 3.75

## 2023-08-28 NOTE — Assessment & Plan Note (Addendum)
 She has chronic constipation and noted to have moderate stool burden We discussed importance of laxative therapy

## 2023-08-28 NOTE — Progress Notes (Signed)
  [facility] OFFICE PROGRESS NOTE  Patient Care Team: Tena Feeling, MD as PCP - General (Internal Medicine) Thukkani, Arun K, MD as PCP - Cardiology (Cardiology)  Assessment & Plan Malignant granulosa cell tumor of ovary, unspecified laterality [facility]) The patient was originally diagnosed with granulosa cell tumor of the ovary in 1994 status post surgery.  She had relapse in 2002, resected followed by 6 cycles of cisplatin and etoposide and then have disease relapse again in 2010 Pathology: Granulosa cell tumor of the ovary, 2018 - Core biopsy for ER/PR and Foundation One testing performed. Unfortunately, no sufficient tissue for Foundation One testing. ER was 50% and PR 90%. Between 2010-2025, Progressed on carboplatin , exemestane , Avastin ,Tamoxifen /Megace  and letrozole , mixed response on Lupron   Her treatment course is stabilized currently on combination of Lupron  monthly and daily anastrozole  So far, she tolerated treatment well without major side effects I reviewed blood work and imaging studies with the patient In comparison with her previous imaging from last year, she has stability of disease control We will continue treatment indefinitely She will return here for monthly lab and Lupron  injection and I will see her again in 3 months I plan to delay her next imaging study until end of the year given stability of disease control Stage 3 chronic kidney disease, unspecified whether stage 3a or 3b CKD (HCC) Her renal function is stable/slightly fluctuated Observe closely Discussed importance of oral fluid hydration Other constipation She has chronic constipation and noted to have moderate stool burden We discussed importance of laxative therapy  No orders of the defined types were placed in this encounter.    Almeda Jacobs, MD  INTERVAL HISTORY: she returns for treatment follow-up Complications related to previous cycle of chemotherapy included constipation, Overall,  she tolerated treatment well Denies nausea or abdominal pain She denies hot flashes She is taking MiraLAX every day due to chronic constipation  PHYSICAL EXAMINATION: ECOG PERFORMANCE STATUS: 0 - Asymptomatic  Vitals:   08/28/23 1109  BP: 114/62  Pulse: 68  Resp: 16  Temp: 98.2 F (36.8 C)  SpO2: 99%   Filed Weights   08/28/23 1109  Weight: 194 lb (88 kg)    Relevant data reviewed during this visit included CBC, CMP, CT imaging from April 2025, November 2024 and July 2024

## 2023-08-28 NOTE — Assessment & Plan Note (Addendum)
Her renal function is stable/slightly fluctuated Observe closely Discussed importance of oral fluid hydration

## 2023-08-28 NOTE — Assessment & Plan Note (Addendum)
 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  She had relapse in 2002, resected followed by 6 cycles of cisplatin and etoposide and then have disease relapse again in 2010 Pathology: Granulosa cell tumor of the ovary, 2018 - Core biopsy for ER/PR and Foundation One testing performed. Unfortunately, no sufficient tissue for Foundation One testing. ER was 50% and PR 90%. Between 2010-2025, Progressed on carboplatin , exemestane , Avastin ,Tamoxifen /Megace  and letrozole , mixed response on Lupron   Her treatment course is stabilized currently on combination of Lupron  monthly and daily anastrozole  So far, she tolerated treatment well without major side effects I reviewed blood work and imaging studies with the patient In comparison with her previous imaging from last year, she has stability of disease control We will continue treatment indefinitely She will return here for monthly lab and Lupron  injection and I will see her again in 3 months I plan to delay her next imaging study until end of the year given stability of disease control

## 2023-09-08 ENCOUNTER — Encounter (INDEPENDENT_AMBULATORY_CARE_PROVIDER_SITE_OTHER): Payer: Self-pay | Admitting: Physician Assistant

## 2023-09-09 DIAGNOSIS — M67441 Ganglion, right hand: Secondary | ICD-10-CM | POA: Diagnosis not present

## 2023-09-09 DIAGNOSIS — M151 Heberden's nodes (with arthropathy): Secondary | ICD-10-CM | POA: Diagnosis not present

## 2023-09-10 ENCOUNTER — Other Ambulatory Visit (HOSPITAL_COMMUNITY): Payer: Self-pay

## 2023-09-10 DIAGNOSIS — G8918 Other acute postprocedural pain: Secondary | ICD-10-CM | POA: Diagnosis not present

## 2023-09-10 DIAGNOSIS — S83241A Other tear of medial meniscus, current injury, right knee, initial encounter: Secondary | ICD-10-CM | POA: Diagnosis not present

## 2023-09-10 DIAGNOSIS — M65961 Unspecified synovitis and tenosynovitis, right lower leg: Secondary | ICD-10-CM | POA: Diagnosis not present

## 2023-09-10 DIAGNOSIS — M6751 Plica syndrome, right knee: Secondary | ICD-10-CM | POA: Diagnosis not present

## 2023-09-10 DIAGNOSIS — M2241 Chondromalacia patellae, right knee: Secondary | ICD-10-CM | POA: Diagnosis not present

## 2023-09-10 DIAGNOSIS — S83231A Complex tear of medial meniscus, current injury, right knee, initial encounter: Secondary | ICD-10-CM | POA: Diagnosis not present

## 2023-09-10 DIAGNOSIS — M65861 Other synovitis and tenosynovitis, right lower leg: Secondary | ICD-10-CM | POA: Diagnosis not present

## 2023-09-17 ENCOUNTER — Other Ambulatory Visit: Payer: Self-pay

## 2023-09-17 ENCOUNTER — Other Ambulatory Visit (HOSPITAL_COMMUNITY): Payer: Self-pay

## 2023-09-17 MED ORDER — MIRABEGRON ER 50 MG PO TB24
50.0000 mg | ORAL_TABLET | Freq: Every day | ORAL | 5 refills | Status: DC
  Filled 2023-09-17: qty 30, 30d supply, fill #0
  Filled 2023-11-10: qty 30, 30d supply, fill #1

## 2023-09-21 ENCOUNTER — Ambulatory Visit (HOSPITAL_BASED_OUTPATIENT_CLINIC_OR_DEPARTMENT_OTHER): Payer: Self-pay

## 2023-09-30 ENCOUNTER — Inpatient Hospital Stay

## 2023-09-30 ENCOUNTER — Inpatient Hospital Stay: Attending: Hematology

## 2023-09-30 DIAGNOSIS — C569 Malignant neoplasm of unspecified ovary: Secondary | ICD-10-CM | POA: Diagnosis not present

## 2023-09-30 LAB — CBC WITH DIFFERENTIAL/PLATELET
Abs Immature Granulocytes: 0.01 10*3/uL (ref 0.00–0.07)
Basophils Absolute: 0.1 10*3/uL (ref 0.0–0.1)
Basophils Relative: 1 %
Eosinophils Absolute: 0.2 10*3/uL (ref 0.0–0.5)
Eosinophils Relative: 4 %
HCT: 38.4 % (ref 36.0–46.0)
Hemoglobin: 12.9 g/dL (ref 12.0–15.0)
Immature Granulocytes: 0 %
Lymphocytes Relative: 39 %
Lymphs Abs: 2.2 10*3/uL (ref 0.7–4.0)
MCH: 28 pg (ref 26.0–34.0)
MCHC: 33.6 g/dL (ref 30.0–36.0)
MCV: 83.3 fL (ref 80.0–100.0)
Monocytes Absolute: 0.5 10*3/uL (ref 0.1–1.0)
Monocytes Relative: 8 %
Neutro Abs: 2.8 10*3/uL (ref 1.7–7.7)
Neutrophils Relative %: 48 %
Platelets: 379 10*3/uL (ref 150–400)
RBC: 4.61 MIL/uL (ref 3.87–5.11)
RDW: 13.6 % (ref 11.5–15.5)
WBC: 5.8 10*3/uL (ref 4.0–10.5)
nRBC: 0 % (ref 0.0–0.2)

## 2023-09-30 LAB — COMPREHENSIVE METABOLIC PANEL WITH GFR
ALT: 15 U/L (ref 0–44)
AST: 18 U/L (ref 15–41)
Albumin: 4.2 g/dL (ref 3.5–5.0)
Alkaline Phosphatase: 81 U/L (ref 38–126)
Anion gap: 7 (ref 5–15)
BUN: 22 mg/dL (ref 8–23)
CO2: 27 mmol/L (ref 22–32)
Calcium: 9.4 mg/dL (ref 8.9–10.3)
Chloride: 103 mmol/L (ref 98–111)
Creatinine, Ser: 1.3 mg/dL — ABNORMAL HIGH (ref 0.44–1.00)
GFR, Estimated: 45 mL/min — ABNORMAL LOW (ref >60)
Glucose, Bld: 113 mg/dL — ABNORMAL HIGH (ref 70–99)
Potassium: 4.1 mmol/L (ref 3.5–5.1)
Sodium: 137 mmol/L (ref 135–145)
Total Bilirubin: 0.4 mg/dL (ref 0.0–1.2)
Total Protein: 7 g/dL (ref 6.5–8.1)

## 2023-10-01 ENCOUNTER — Inpatient Hospital Stay

## 2023-10-01 VITALS — BP 122/61 | Resp 16

## 2023-10-01 DIAGNOSIS — C569 Malignant neoplasm of unspecified ovary: Secondary | ICD-10-CM

## 2023-10-01 MED ORDER — LEUPROLIDE ACETATE 3.75 MG IM KIT
3.7500 mg | PACK | Freq: Once | INTRAMUSCULAR | Status: AC
  Administered 2023-10-01: 3.75 mg via INTRAMUSCULAR
  Filled 2023-10-01: qty 3.75

## 2023-10-03 ENCOUNTER — Encounter (HOSPITAL_BASED_OUTPATIENT_CLINIC_OR_DEPARTMENT_OTHER): Payer: Self-pay | Admitting: Emergency Medicine

## 2023-10-03 ENCOUNTER — Ambulatory Visit (HOSPITAL_BASED_OUTPATIENT_CLINIC_OR_DEPARTMENT_OTHER)
Admission: EM | Admit: 2023-10-03 | Discharge: 2023-10-03 | Disposition: A | Discharge status: Home / Self Care | Attending: Family Medicine | Admitting: Family Medicine

## 2023-10-03 DIAGNOSIS — N3001 Acute cystitis with hematuria: Secondary | ICD-10-CM

## 2023-10-03 DIAGNOSIS — M79661 Pain in right lower leg: Secondary | ICD-10-CM | POA: Diagnosis not present

## 2023-10-03 LAB — POCT URINALYSIS DIP (MANUAL ENTRY)
Bilirubin, UA: NEGATIVE
Glucose, UA: NEGATIVE mg/dL
Nitrite, UA: NEGATIVE
Protein Ur, POC: >=300 mg/dL — AB
Spec Grav, UA: >=1.03 — AB (ref 1.010–1.025)
Urobilinogen, UA: 1 U/dL
pH, UA: 6 (ref 5.0–8.0)

## 2023-10-03 MED ORDER — NITROFURANTOIN MONOHYD MACRO 100 MG PO CAPS: 100.0000 mg | ORAL_CAPSULE | Freq: Two times a day (BID) | ORAL | 0 refills | Status: DC

## 2023-10-03 NOTE — Discharge Instructions (Signed)
 Treating you for a urinary tract infection.  Take the antibiotics as prescribed.  Make sure you are drinking plenty of fluids follow-up for any continued issues

## 2023-10-03 NOTE — ED Triage Notes (Signed)
 Pt c/o urinary frequency and burning x 3 days

## 2023-10-04 NOTE — ED Provider Notes (Signed)
 [name]    CSN: [id number] Arrival date & time: 10/03/23  1755      History   Chief Complaint No chief complaint on file.   HPI [name] is a 68 y.o. female.   68 year old female presents today with dysuria, urinary retention, frequency over the past few days.  Symptoms been constant.  History of UTIs in the past.  No fevers, chills, flank pain     Past Medical History:  Diagnosis Date   Anxiety    Arthritis    Cervical syndrome    CKD (chronic kidney disease), stage III (HCC)    Constipation    COVID-19 03/26/2019   Family history of colon cancer 02/15/2020   Fatty liver    GERD (gastroesophageal reflux disease)    Granulosa cell tumor of ovary    History of hiatal hernia    Hyperlipidemia    Joint pain    Neck pain    Obesity    Osteoarthritis    Ovarian cancer (HCC)    PONV (postoperative nausea and vomiting)    history of n/v,  past surgeries no n/v   Sleep apnea    uses CPAP   Tremor     Patient Active Problem List   Diagnosis Date Noted   Generalized obesity- Start BMI 33.73 08/22/2022   BMI 29.0-29.9,adult Current BMI 29.9 08/22/2022   Class 1 obesity with serious comorbidity and body mass index (BMI) of 33.0 to 33.9 in adult 04/24/2022   Other constipation 03/26/2022   Dysuria 02/14/2022   Stable angina (HCC) 08/30/2021   Mouth sore secondary to chemotherapy 07/02/2021   Allergy to multiple antibiotics 05/11/2021   Urinary tract infection 05/07/2021   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   Neck pain 02/23/2021   Tremor 02/23/2021   Skin rash 12/27/2020   Elevated liver enzymes 11/07/2020   Anxiety about health 11/01/2020   Cervical pain (neck) 10/17/2020   Hot flashes 09/21/2020   History of vitamin D  deficiency 08/28/2020   Depression 08/28/2020   At risk for dehydration 08/28/2020   Abdominal pain 08/28/2020   Weight gain 07/06/2020   Joint pain 07/06/2020   Elevated BP without diagnosis of  hypertension 04/26/2020   Osteoporosis 04/25/2020   Leukocytosis 03/01/2020   Genetic testing 02/23/2020   Family history of colon cancer 02/15/2020   Pain, dental 01/21/2020   Other hyperlipidemia, mixed 11/30/2019   Prediabetes 11/04/2019   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   Poor venous access 08/12/2019   Pancytopenia, acquired (HCC) 08/05/2019   Cellulitis 07/26/2019   CKD (chronic kidney disease), stage III (HCC)    Goals of care, counseling/discussion 06/08/2019   Back pain, chronic 06/08/2019   Insulin  resistance 05/25/2018   Vitamin D  deficiency 05/25/2018   Class 1 obesity with serious comorbidity and body mass index (BMI) of 31.0 to 31.9 in adult 05/25/2018   Obstructive sleep apnea 08/18/2017   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    Past Surgical History:  Procedure Laterality Date   ABDOMINAL HYSTERECTOMY     TAH/BSO   APPENDECTOMY     EXPLORATORY LAPAROTOMY     for bowel obstruction   IR IMAGING GUIDED PORT INSERTION  10/25/2020   IR REMOVAL TUN ACCESS W/ PORT W/O FL MOD SED  06/19/2021   left toe surgery Left    Secondary tumor debulking  2002   SHOULDER SURGERY     2017 left   Tumor debulking  2010  VENTRAL HERNIA REPAIR      OB History     Gravida  3   Para      Term      Preterm      AB      Living  2      SAB      IAB      Ectopic      Multiple      Live Births               Home Medications    Prior to Admission medications   Medication Sig Start Date End Date Taking? Authorizing Provider  anastrozole  (ARIMIDEX ) 1 MG tablet Take 1 tablet (1 mg total) by mouth daily. 03/27/23  Yes [name], Ni, MD  escitalopram  (LEXAPRO ) 20 MG tablet Take 1 tablet (20 mg total) by mouth daily. 02/07/23  Yes   escitalopram  (LEXAPRO ) 20 MG tablet Take 1 tablet (20 mg total) by mouth daily. 08/04/23  Yes   nitrofurantoin , macrocrystal-monohydrate, (MACROBID ) 100 MG capsule Take 1 capsule (100 mg total) by mouth 2 (two)  times daily. 10/03/23  Yes Arland Usery A, FNP  omeprazole  (PRILOSEC) 20 MG capsule Take 1 capsule (20 mg total) by mouth daily. 02/07/23  Yes   omeprazole  (PRILOSEC) 20 MG capsule Take 1 capsule (20 mg total) by mouth daily. 08/04/23  Yes   Vitamin D , Ergocalciferol , (DRISDOL ) 1.25 MG (50000 UNIT) CAPS capsule Take 1 capsule by mouth every 10 days. 07/24/23  Yes Rayburn, Evangelina Hilt, PA-C  acetaminophen  (TYLENOL ) 500 MG tablet Take 1,000 mg by mouth every 6 (six) hours as needed for moderate pain.    [provider]  albuterol  (VENTOLIN  HFA) 108 (90 Base) MCG/ACT inhaler Inhale 2 puffs into the lungs every 4 (four) hours as needed for wheezing or shortness of breath. 07/07/23   Guss Legacy, FNP  aspirin EC 81 MG tablet Take 81 mg by mouth daily. Swallow whole.    [provider]  Biotin 5 MG TABS Take 5 mg by mouth daily.    [provider]  buPROPion  (WELLBUTRIN  SR) 200 MG 12 hr tablet Take 1 tablet (200 mg total) by mouth 2 (two) times daily. 07/24/23 01/20/24  Rayburn, Evangelina Hilt, PA-C  ezetimibe  (ZETIA ) 10 MG tablet Take 1 tablet (10 mg total) by mouth daily. 01/08/23   Flo Hummingbird, PA-C  gabapentin  (NEURONTIN ) 300 MG capsule Take 1 capsule (300 mg total) by mouth 2 (two) times daily. 08/20/22   Almeda Jacobs, MD  leuprolide  (LUPRON ) 30 MG injection Inject 30 mg into the muscle every 30 (thirty) days.    [provider]  metFORMIN  (GLUCOPHAGE ) 500 MG tablet Take 2 tablets (1,000 mg total) by mouth daily with supper. 07/24/23   Rayburn, Evangelina Hilt, PA-C  mirabegron  ER (MYRBETRIQ ) 50 MG TB24 tablet Take 1 tablet (50 mg total) by mouth daily. 09/17/23     Polyethyl Glycol-Propyl Glycol (SYSTANE OP) Place 1 drop into both eyes daily as needed (dry eyes).    [provider]  polyethylene glycol (MIRALAX / GLYCOLAX) 17 g packet Take 17 g by mouth daily.    [provider]  Probiotic Product (ALIGN PO) Take 1 tablet by mouth daily.      [provider]  Spacer/Aero-Holding Chambers (COMPACT SPACE CHAMBER) DEVI Use with the albuterol  inhaler 07/07/23   Guss Legacy, FNP  tretinoin  (RETIN-A ) 0.025 % cream Apply 1 application to the face Nightly. 12/18/22     triamcinolone  cream (KENALOG )  0.1 % Apply 1 small Application topically 2 (two) times daily to affected area. 12/03/22       Family History Family History  Problem Relation Age of Onset   Basal cell carcinoma Mother        58s   Thyroid  disease Mother    Stroke Father    Colon cancer Father 66   Hypertension Father    Heart disease Father    Colon cancer Paternal Uncle        dx 71s   Colon cancer Cousin        maternal cousin; dx 89s   BRCA 1/2 Neg Hx    Breast cancer Neg Hx     Social History Social History   Tobacco Use   Smoking status: Never   Smokeless tobacco: Never  Vaping Use   Vaping status: Never Used  Substance Use Topics   Alcohol use: Not Currently   Drug use: No     Allergies   Codeine, Thimerosal (thiomersal), Ciprofloxacin, Daypro [oxaprozin], Levofloxacin, Stadol [butorphanol tartrate], and Sulfa antibiotics   Review of Systems Review of Systems  See HPI Physical Exam Triage Vital Signs ED Triage Vitals  Encounter Vitals Group     BP 10/03/23 1810 113/64     Girls Systolic BP Percentile --      Girls Diastolic BP Percentile --      Boys Systolic BP Percentile --      Boys Diastolic BP Percentile --      Pulse Rate 10/03/23 1810 66     Resp 10/03/23 1810 18     Temp 10/03/23 1810 98.1 F (36.7 C)     Temp Source 10/03/23 1810 Oral     SpO2 10/03/23 1810 97 %     Weight --      Height --      Head Circumference --      Peak Flow --      Pain Score 10/03/23 1809 4     Pain Loc --      Pain Education --      Exclude from Growth Chart --    No data found.  Updated Vital Signs BP 113/64 (BP Location: Right Arm)   Pulse 66   Temp 98.1 F (36.7 C) (Oral)   Resp 18   SpO2 97%   Visual Acuity Right Eye  Distance:   Left Eye Distance:   Bilateral Distance:    Right Eye Near:   Left Eye Near:    Bilateral Near:     Physical Exam Vitals and nursing note reviewed.  Constitutional:      General: She is not in acute distress.    Appearance: Normal appearance. She is not ill-appearing, toxic-appearing or diaphoretic.  Pulmonary:     Effort: Pulmonary effort is normal.   Neurological:     Mental Status: She is alert.   Psychiatric:        Mood and Affect: Mood normal.      UC Treatments / Results  Labs (all labs ordered are listed, but only abnormal results are displayed) Labs Reviewed  POCT URINALYSIS DIP (MANUAL ENTRY) - Abnormal; Notable for the following components:      Result Value   Ketones, POC UA small (15) (*)    Spec Grav, UA >=1.030 (*)    Blood, UA large (*)    Protein Ur, POC >=300 (*)    Leukocytes, UA Trace (*)    All other components within normal limits  EKG   Radiology No results found.  Procedures Procedures (including critical care time)  Medications Ordered in UC Medications - No data to display  Initial Impression / Assessment and Plan / UC Course  I have reviewed the triage vital signs and the nursing notes.  Pertinent labs & imaging results that were available during my care of the patient were reviewed by me and considered in my medical decision making (see chart for details).     Acute cystitis with hematuria-urine with trace leuks, protein, large blood, small ketones Consistent with urinary tract infection we will go ahead and treat with Macrobid .  Culture pending.  Will call with any changes in culture results.  Recommended push fluids and follow-up as needed Final Clinical Impressions(s) / UC Diagnoses   Final diagnoses:  Acute cystitis with hematuria     Discharge Instructions      Treating you for a urinary tract infection.  Take the antibiotics as prescribed.  Make sure you are drinking plenty of fluids follow-up for  any continued issues   ED Prescriptions     Medication Sig Dispense Auth. Provider   nitrofurantoin , macrocrystal-monohydrate, (MACROBID ) 100 MG capsule Take 1 capsule (100 mg total) by mouth 2 (two) times daily. 10 capsule Landa Pine, FNP      PDMP not reviewed this encounter.   Landa Pine, FNP 10/04/23 [phone]

## 2023-10-06 ENCOUNTER — Ambulatory Visit (HOSPITAL_COMMUNITY)
Admission: RE | Admit: 2023-10-06 | Discharge: 2023-10-06 | Disposition: A | Discharge status: Home / Self Care | Source: Ambulatory Visit | Attending: Hematology and Oncology | Admitting: Hematology and Oncology

## 2023-10-06 ENCOUNTER — Other Ambulatory Visit: Payer: Self-pay | Admitting: Hematology and Oncology

## 2023-10-06 ENCOUNTER — Telehealth: Payer: Self-pay

## 2023-10-06 DIAGNOSIS — C569 Malignant neoplasm of unspecified ovary: Secondary | ICD-10-CM | POA: Insufficient documentation

## 2023-10-06 DIAGNOSIS — M7989 Other specified soft tissue disorders: Secondary | ICD-10-CM | POA: Diagnosis not present

## 2023-10-06 NOTE — Telephone Encounter (Signed)
 She called complaining of right calf pain that has not gone away. A couple weeks ago she had knee surgery. PCP recently did a D-dimer that was 0.51. She is still having right calf pain and concerned for possible DVT. Scheduled doppler at 1200 today at [facility], arrive at 1145. [place] is aware of appt and told Dr. Marton Sleeper will see her if doppler positive. She verbalized understanding.

## 2023-10-06 NOTE — Progress Notes (Signed)
 VASCULAR LAB    Right lower extremity venous duplex has been performed.  See CV proc for preliminary results.  Called results to Candida Chalk, [facility], RVT 10/06/2023, 12:24 PM

## 2023-10-06 NOTE — Progress Notes (Signed)
 Notified per Dr. Marton Sleeper, doppler negative and final report says effusion, likely just swelling from the surgery. Instructed that she can use compression, elevation and ice. She verbalized understanding.

## 2023-10-28 DIAGNOSIS — M1611 Unilateral primary osteoarthritis, right hip: Secondary | ICD-10-CM | POA: Diagnosis not present

## 2023-10-28 DIAGNOSIS — M7061 Trochanteric bursitis, right hip: Secondary | ICD-10-CM | POA: Diagnosis not present

## 2023-10-29 ENCOUNTER — Inpatient Hospital Stay

## 2023-10-29 ENCOUNTER — Inpatient Hospital Stay: Attending: Hematology

## 2023-10-29 VITALS — BP 121/62 | HR 68 | Temp 97.6°F | Resp 17

## 2023-10-29 DIAGNOSIS — C569 Malignant neoplasm of unspecified ovary: Secondary | ICD-10-CM | POA: Diagnosis not present

## 2023-10-29 LAB — CBC WITH DIFFERENTIAL/PLATELET
Abs Immature Granulocytes: 0.08 K/uL — ABNORMAL HIGH (ref 0.00–0.07)
Basophils Absolute: 0 K/uL (ref 0.0–0.1)
Basophils Relative: 0 %
Eosinophils Absolute: 0 K/uL (ref 0.0–0.5)
Eosinophils Relative: 0 %
HCT: 38 % (ref 36.0–46.0)
Hemoglobin: 12.4 g/dL (ref 12.0–15.0)
Immature Granulocytes: 1 %
Lymphocytes Relative: 9 %
Lymphs Abs: 1.4 K/uL (ref 0.7–4.0)
MCH: 27.6 pg (ref 26.0–34.0)
MCHC: 32.6 g/dL (ref 30.0–36.0)
MCV: 84.4 fL (ref 80.0–100.0)
Monocytes Absolute: 0.6 K/uL (ref 0.1–1.0)
Monocytes Relative: 4 %
Neutro Abs: 14.2 K/uL — ABNORMAL HIGH (ref 1.7–7.7)
Neutrophils Relative %: 86 %
Platelets: 365 K/uL (ref 150–400)
RBC: 4.5 MIL/uL (ref 3.87–5.11)
RDW: 13.3 % (ref 11.5–15.5)
WBC: 16.4 K/uL — ABNORMAL HIGH (ref 4.0–10.5)
nRBC: 0 % (ref 0.0–0.2)

## 2023-10-29 LAB — COMPREHENSIVE METABOLIC PANEL WITH GFR
ALT: 19 U/L (ref 0–44)
AST: 19 U/L (ref 15–41)
Albumin: 4.1 g/dL (ref 3.5–5.0)
Alkaline Phosphatase: 75 U/L (ref 38–126)
Anion gap: 9 (ref 5–15)
BUN: 30 mg/dL — ABNORMAL HIGH (ref 8–23)
CO2: 26 mmol/L (ref 22–32)
Calcium: 9.7 mg/dL (ref 8.9–10.3)
Chloride: 102 mmol/L (ref 98–111)
Creatinine, Ser: 1.09 mg/dL — ABNORMAL HIGH (ref 0.44–1.00)
GFR, Estimated: 56 mL/min — ABNORMAL LOW (ref >60)
Glucose, Bld: 151 mg/dL — ABNORMAL HIGH (ref 70–99)
Potassium: 4.3 mmol/L (ref 3.5–5.1)
Sodium: 137 mmol/L (ref 135–145)
Total Bilirubin: 0.4 mg/dL (ref 0.0–1.2)
Total Protein: 7.1 g/dL (ref 6.5–8.1)

## 2023-10-29 MED ORDER — LEUPROLIDE ACETATE 3.75 MG IM KIT
3.7500 mg | PACK | Freq: Once | INTRAMUSCULAR | Status: AC
  Administered 2023-10-29: 3.75 mg via INTRAMUSCULAR
  Filled 2023-10-29: qty 3.75

## 2023-11-07 ENCOUNTER — Ambulatory Visit: Admitting: Gynecologic Oncology

## 2023-11-10 ENCOUNTER — Other Ambulatory Visit: Payer: Self-pay

## 2023-11-10 ENCOUNTER — Other Ambulatory Visit (HOSPITAL_COMMUNITY): Payer: Self-pay

## 2023-11-18 DIAGNOSIS — M1611 Unilateral primary osteoarthritis, right hip: Secondary | ICD-10-CM | POA: Diagnosis not present

## 2023-11-21 ENCOUNTER — Ambulatory Visit: Admitting: Gynecologic Oncology

## 2023-11-26 ENCOUNTER — Ambulatory Visit (INDEPENDENT_AMBULATORY_CARE_PROVIDER_SITE_OTHER): Admitting: Physician Assistant

## 2023-11-27 ENCOUNTER — Inpatient Hospital Stay

## 2023-11-27 ENCOUNTER — Telehealth: Payer: Self-pay

## 2023-11-27 ENCOUNTER — Encounter: Payer: Self-pay | Admitting: Hematology and Oncology

## 2023-11-27 ENCOUNTER — Inpatient Hospital Stay: Attending: Hematology | Admitting: Hematology and Oncology

## 2023-11-27 VITALS — BP 146/72 | HR 65 | Temp 98.3°F | Resp 18 | Ht 66.0 in | Wt 198.8 lb

## 2023-11-27 DIAGNOSIS — C569 Malignant neoplasm of unspecified ovary: Secondary | ICD-10-CM | POA: Diagnosis not present

## 2023-11-27 DIAGNOSIS — N183 Chronic kidney disease, stage 3 unspecified: Secondary | ICD-10-CM | POA: Insufficient documentation

## 2023-11-27 DIAGNOSIS — R030 Elevated blood-pressure reading, without diagnosis of hypertension: Secondary | ICD-10-CM

## 2023-11-27 DIAGNOSIS — M810 Age-related osteoporosis without current pathological fracture: Secondary | ICD-10-CM | POA: Diagnosis not present

## 2023-11-27 LAB — COMPREHENSIVE METABOLIC PANEL WITH GFR
ALT: 15 U/L (ref 0–44)
AST: 14 U/L — ABNORMAL LOW (ref 15–41)
Albumin: 4.2 g/dL (ref 3.5–5.0)
Alkaline Phosphatase: 86 U/L (ref 38–126)
Anion gap: 5 (ref 5–15)
BUN: 21 mg/dL (ref 8–23)
CO2: 29 mmol/L (ref 22–32)
Calcium: 9.1 mg/dL (ref 8.9–10.3)
Chloride: 101 mmol/L (ref 98–111)
Creatinine, Ser: 1.14 mg/dL — ABNORMAL HIGH (ref 0.44–1.00)
GFR, Estimated: 53 mL/min — ABNORMAL LOW (ref >60)
Glucose, Bld: 54 mg/dL — ABNORMAL LOW (ref 70–99)
Potassium: 4 mmol/L (ref 3.5–5.1)
Sodium: 135 mmol/L (ref 135–145)
Total Bilirubin: 0.3 mg/dL (ref 0.0–1.2)
Total Protein: 6.9 g/dL (ref 6.5–8.1)

## 2023-11-27 LAB — CBC WITH DIFFERENTIAL/PLATELET
Abs Immature Granulocytes: 0.01 K/uL (ref 0.00–0.07)
Basophils Absolute: 0.1 K/uL (ref 0.0–0.1)
Basophils Relative: 1 %
Eosinophils Absolute: 0.2 K/uL (ref 0.0–0.5)
Eosinophils Relative: 3 %
HCT: 39.7 % (ref 36.0–46.0)
Hemoglobin: 13.1 g/dL (ref 12.0–15.0)
Immature Granulocytes: 0 %
Lymphocytes Relative: 30 %
Lymphs Abs: 1.9 K/uL (ref 0.7–4.0)
MCH: 27.4 pg (ref 26.0–34.0)
MCHC: 33 g/dL (ref 30.0–36.0)
MCV: 83.1 fL (ref 80.0–100.0)
Monocytes Absolute: 0.7 K/uL (ref 0.1–1.0)
Monocytes Relative: 12 %
Neutro Abs: 3.3 K/uL (ref 1.7–7.7)
Neutrophils Relative %: 54 %
Platelets: 361 K/uL (ref 150–400)
RBC: 4.78 MIL/uL (ref 3.87–5.11)
RDW: 13.2 % (ref 11.5–15.5)
WBC: 6.1 K/uL (ref 4.0–10.5)
nRBC: 0 % (ref 0.0–0.2)

## 2023-11-27 MED ORDER — LEUPROLIDE ACETATE 3.75 MG IM KIT
3.7500 mg | PACK | Freq: Once | INTRAMUSCULAR | Status: AC
Start: 2023-11-27 — End: 2023-11-27
  Administered 2023-11-27: 3.75 mg via INTRAMUSCULAR
  Filled 2023-11-27: qty 3.75

## 2023-11-27 NOTE — Assessment & Plan Note (Addendum)
She is noted to have slightly elevated blood pressure but could be due to anxiety Monitor closely for now

## 2023-11-27 NOTE — Progress Notes (Signed)
 Injection not given. Closing encounter. Andrea CHRISTELLA Plunk, RN

## 2023-11-27 NOTE — Assessment & Plan Note (Addendum)
 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  She had relapse in 2002, resected followed by 6 cycles of cisplatin and etoposide and then have disease relapse again in 2010 Pathology: Granulosa cell tumor of the ovary, 2018 - Core biopsy for ER/PR and Foundation One testing performed. Unfortunately, no sufficient tissue for Foundation One testing. ER was 50% and PR 90%. Between 2010-2025, Progressed on carboplatin , exemestane , Avastin ,Tamoxifen /Megace  and letrozole , mixed response on Lupron   Her treatment course is stabilized currently on combination of Lupron  monthly and daily anastrozole  So far, she tolerated treatment well without major side effects I reviewed blood work and imaging studies with the patient In comparison with her previous imaging from last year, she has stability of disease control We will continue treatment indefinitely She will return here for monthly lab and Lupron  injection and I will see her again in 3 months I plan to repeat imaging study before her appointment to see me in 3 months Due to chronic exposure to antiestrogen treatment, she is at risk of osteoporosis I will also order DEXA scan

## 2023-11-27 NOTE — Telephone Encounter (Signed)
 Patient made aware of bone density scan scheduled for Thursday, 9/11 at 10 AM at the [facility] location. Patient is to arrive 15 minutes early through the urgent care entrance and check in at desk 3.  Patient advised to hold any calcium  supplements 2 days prior to the exam and to ensure that she does not wear any pants with metal at the waistband.  Patient confirmed receipt of the above information.

## 2023-11-27 NOTE — Progress Notes (Signed)
 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  She had relapse in 2002, resected followed by 6 cycles of cisplatin and etoposide and then have disease relapse again in 2010 Pathology: Granulosa cell tumor of the ovary, 2018 - Core biopsy for ER/PR and Foundation One testing performed. Unfortunately, no sufficient tissue for Foundation One testing. ER was 50% and PR 90%. Between 2010-2025, Progressed on carboplatin , exemestane , Avastin ,Tamoxifen /Megace  and letrozole , mixed response on Lupron   Her treatment course is stabilized currently on combination of Lupron  monthly and daily anastrozole  So far, she tolerated treatment well without major side effects I reviewed blood work and imaging studies with the patient In comparison with her previous imaging from last year, she has stability of disease control We will continue treatment indefinitely She will return here for monthly lab and Lupron  injection and I will see her again in 3 months I plan to repeat imaging study before her appointment to see me in 3 months Due to chronic exposure to antiestrogen treatment, she is at risk of osteoporosis I will also order DEXA scan Osteoporosis, unspecified osteoporosis type, unspecified pathological fracture presence  Stage 3 chronic kidney disease, unspecified whether stage 3a or 3b CKD (HCC) Her renal function is stable/slightly fluctuated Observe closely Discussed importance of oral fluid hydration Elevated BP without diagnosis of hypertension She is noted to have slightly elevated blood pressure but could be due to anxiety Monitor closely for now  Orders Placed This Encounter   Procedures   DG Bone Density    Standing Status:   Future    Expected Date:   12/28/2023    Expiration Date:   11/26/2024    Reason for Exam (SYMPTOM  OR DIAGNOSIS REQUIRED):   osteopenia, postmenopausal, on treatment for cancer    Preferred imaging location?:   External   CT ABDOMEN PELVIS W CONTRAST    Standing Status:   Future    Expected Date:   02/19/2024    Expiration Date:   11/26/2024    Scheduling Instructions:     No need oral contrast    If indicated for the ordered procedure, I authorize the administration of contrast media per Radiology protocol:   Yes    Does the patient have a contrast media/X-ray dye allergy?:   No    Preferred imaging location?:   [facility]    If indicated for the ordered procedure, I authorize the administration of oral contrast media per Radiology protocol:   No    Reason for no oral contrast::   No need oral contrast     Jacqueline Bedford, MD  INTERVAL HISTORY: she returns for treatment follow-up Complications related to previous cycle of treatment included fatigue,, elevated BP, and elevated serum creatinine She noticed significant hip pain and is contemplating hip surgery in the near future  PHYSICAL EXAMINATION: ECOG PERFORMANCE STATUS: 1 - Symptomatic but completely ambulatory  No results found for: [id number]    Latest Ref Rng & Units 11/27/2023    7:44 AM 10/29/2023    8:06 AM 09/30/2023    2:52 PM  CBC  WBC 4.0 - 10.5 K/uL 6.1  16.4  5.8   Hemoglobin 12.0 - 15.0 g/dL 13.1  12.4  12.9   Hematocrit 36.0 - 46.0 % 39.7  38.0  38.4   Platelets 150 - 400 K/uL 361  365  379       Chemistry      Component Value Date/Time   NA 135 11/27/2023 0744   NA 140 07/09/2021 0813   NA 141 09/24/2013 1131   K 4.0 11/27/2023 0744   K 4.5 09/24/2013 1131   CL 101 11/27/2023 0744   CO2 29 11/27/2023 0744   CO2 24 09/24/2013 1131   BUN 21 11/27/2023 0744   BUN 20 07/09/2021 0813   BUN 21.2 09/24/2013 1131   CREATININE 1.14 (H) 11/27/2023 0744    CREATININE 1.15 (H) 08/28/2023 0904   CREATININE 1.1 09/24/2013 1131      Component Value Date/Time   CALCIUM  9.1 11/27/2023 0744   CALCIUM  9.5 09/24/2013 1131   ALKPHOS 86 11/27/2023 0744   ALKPHOS 81 09/24/2013 1131   AST 14 (L) 11/27/2023 0744   AST 19 08/28/2023 0904   AST 23 09/24/2013 1131   ALT 15 11/27/2023 0744   ALT 16 08/28/2023 0904   ALT 27 09/24/2013 1131   BILITOT 0.3 11/27/2023 0744   BILITOT 0.4 08/28/2023 0904   BILITOT 0.53 09/24/2013 1131       Vitals:   11/27/23 0845  BP: (!) 146/72  Pulse: 65  Resp: 18  Temp: 98.3 F (36.8 C)  SpO2: 100%   Filed Weights   11/27/23 0845  Weight: 198 lb 12.8 oz (90.2 kg)   Other relevant data reviewed during this visit included CBC and CMP

## 2023-11-27 NOTE — Assessment & Plan Note (Addendum)
Her renal function is stable/slightly fluctuated Observe closely Discussed importance of oral fluid hydration

## 2023-11-28 ENCOUNTER — Inpatient Hospital Stay: Admitting: Gynecologic Oncology

## 2023-11-28 MED ORDER — ISOSORBIDE MONONITRATE ER 30 MG PO TB24: 30.0000 mg | ORAL_TABLET | Freq: Every day | ORAL | 3 refills | Status: DC

## 2023-12-01 ENCOUNTER — Telehealth: Payer: Self-pay | Admitting: Hematology and Oncology

## 2023-12-01 NOTE — Telephone Encounter (Signed)
 left vm for pt about scheduled appt dates and times

## 2023-12-06 DIAGNOSIS — M25551 Pain in right hip: Secondary | ICD-10-CM | POA: Diagnosis not present

## 2023-12-06 DIAGNOSIS — M79602 Pain in left arm: Secondary | ICD-10-CM | POA: Diagnosis not present

## 2023-12-29 ENCOUNTER — Inpatient Hospital Stay

## 2023-12-29 ENCOUNTER — Inpatient Hospital Stay: Attending: Hematology

## 2023-12-29 DIAGNOSIS — C569 Malignant neoplasm of unspecified ovary: Secondary | ICD-10-CM | POA: Insufficient documentation

## 2023-12-29 MED ORDER — LEUPROLIDE ACETATE 3.75 MG IM KIT
3.7500 mg | PACK | Freq: Once | INTRAMUSCULAR | Status: AC
  Filled 2023-12-29: qty 3.75

## 2023-12-30 ENCOUNTER — Telehealth: Payer: Self-pay | Admitting: *Deleted

## 2023-12-30 NOTE — Telephone Encounter (Signed)
 Received call from Jess/[employer] RN asking if pt is still an active pt with our practice & if there was a Clinical biochemist that she could talk with to coordinate resources.  Message given to Darice Sanes, Navigator.

## 2024-01-01 ENCOUNTER — Other Ambulatory Visit (HOSPITAL_BASED_OUTPATIENT_CLINIC_OR_DEPARTMENT_OTHER): Admitting: Radiology

## 2024-01-02 ENCOUNTER — Inpatient Hospital Stay

## 2024-01-02 VITALS — BP 119/63 | HR 60 | Temp 98.0°F | Resp 16

## 2024-01-02 DIAGNOSIS — C569 Malignant neoplasm of unspecified ovary: Secondary | ICD-10-CM | POA: Diagnosis not present

## 2024-01-02 LAB — COMPREHENSIVE METABOLIC PANEL WITH GFR
ALT: 15 U/L (ref 0–44)
AST: 16 U/L (ref 15–41)
Albumin: 4.2 g/dL (ref 3.5–5.0)
Alkaline Phosphatase: 72 U/L (ref 38–126)
Anion gap: 5 (ref 5–15)
BUN: 25 mg/dL — ABNORMAL HIGH (ref 8–23)
CO2: 29 mmol/L (ref 22–32)
Calcium: 9.1 mg/dL (ref 8.9–10.3)
Chloride: 105 mmol/L (ref 98–111)
Creatinine, Ser: 1.12 mg/dL — ABNORMAL HIGH (ref 0.44–1.00)
GFR, Estimated: 54 mL/min — ABNORMAL LOW (ref >60)
Glucose, Bld: 102 mg/dL — ABNORMAL HIGH (ref 70–99)
Potassium: 4.2 mmol/L (ref 3.5–5.1)
Sodium: 139 mmol/L (ref 135–145)
Total Bilirubin: 0.3 mg/dL (ref 0.0–1.2)
Total Protein: 6.9 g/dL (ref 6.5–8.1)

## 2024-01-02 LAB — CBC WITH DIFFERENTIAL/PLATELET
Abs Immature Granulocytes: 0.01 K/uL (ref 0.00–0.07)
Basophils Absolute: 0 K/uL (ref 0.0–0.1)
Basophils Relative: 1 %
Eosinophils Absolute: 0.2 K/uL (ref 0.0–0.5)
Eosinophils Relative: 3 %
HCT: 37.5 % (ref 36.0–46.0)
Hemoglobin: 12.4 g/dL (ref 12.0–15.0)
Immature Granulocytes: 0 %
Lymphocytes Relative: 41 %
Lymphs Abs: 2.1 K/uL (ref 0.7–4.0)
MCH: 28.1 pg (ref 26.0–34.0)
MCHC: 33.1 g/dL (ref 30.0–36.0)
MCV: 84.8 fL (ref 80.0–100.0)
Monocytes Absolute: 0.6 K/uL (ref 0.1–1.0)
Monocytes Relative: 13 %
Neutro Abs: 2.1 K/uL (ref 1.7–7.7)
Neutrophils Relative %: 42 %
Platelets: 335 K/uL (ref 150–400)
RBC: 4.42 MIL/uL (ref 3.87–5.11)
RDW: 14 % (ref 11.5–15.5)
WBC: 5 K/uL (ref 4.0–10.5)
nRBC: 0 % (ref 0.0–0.2)

## 2024-01-02 MED ORDER — LEUPROLIDE ACETATE 3.75 MG IM KIT
3.7500 mg | PACK | Freq: Once | INTRAMUSCULAR | Status: AC
  Administered 2024-01-02: 3.75 mg via INTRAMUSCULAR
  Filled 2024-01-02: qty 3.75

## 2024-01-08 ENCOUNTER — Encounter (HOSPITAL_BASED_OUTPATIENT_CLINIC_OR_DEPARTMENT_OTHER): Payer: Self-pay

## 2024-01-09 DIAGNOSIS — K219 Gastro-esophageal reflux disease without esophagitis: Secondary | ICD-10-CM | POA: Diagnosis not present

## 2024-01-09 DIAGNOSIS — F322 Major depressive disorder, single episode, severe without psychotic features: Secondary | ICD-10-CM | POA: Diagnosis not present

## 2024-01-09 DIAGNOSIS — I868 Varicose veins of other specified sites: Secondary | ICD-10-CM | POA: Diagnosis not present

## 2024-01-14 ENCOUNTER — Other Ambulatory Visit (HOSPITAL_COMMUNITY): Payer: Self-pay

## 2024-01-14 MED ORDER — MIRABEGRON ER 50 MG PO TB24
50.0000 mg | ORAL_TABLET | Freq: Every day | ORAL | 3 refills | Status: AC
  Filled 2024-01-14: qty 90, 90d supply, fill #0
  Filled 2024-04-15: qty 90, 90d supply, fill #1
  Filled 2024-05-16: qty 90, 90d supply, fill #2

## 2024-01-16 ENCOUNTER — Other Ambulatory Visit: Payer: Self-pay | Admitting: *Deleted

## 2024-01-16 ENCOUNTER — Other Ambulatory Visit: Payer: Self-pay

## 2024-01-16 DIAGNOSIS — M7989 Other specified soft tissue disorders: Secondary | ICD-10-CM

## 2024-01-16 NOTE — Progress Notes (Signed)
 As per Dr. Lonn, faxed over surgical clearance to [facility] with last office note and last labs. Received confirmation of receipt.

## 2024-01-20 ENCOUNTER — Other Ambulatory Visit: Payer: Self-pay

## 2024-01-20 DIAGNOSIS — M7989 Other specified soft tissue disorders: Secondary | ICD-10-CM

## 2024-01-21 ENCOUNTER — Ambulatory Visit (HOSPITAL_COMMUNITY)
Admission: RE | Admit: 2024-01-21 | Discharge: 2024-01-21 | Disposition: A | Discharge status: Home / Self Care | Source: Ambulatory Visit | Attending: Vascular Surgery | Admitting: Vascular Surgery

## 2024-01-21 ENCOUNTER — Ambulatory Visit (INDEPENDENT_AMBULATORY_CARE_PROVIDER_SITE_OTHER): Admitting: Physician Assistant

## 2024-01-21 ENCOUNTER — Ambulatory Visit (HOSPITAL_COMMUNITY)

## 2024-01-21 VITALS — BP 112/65 | HR 60 | Temp 97.8°F | Resp 18 | Ht 66.0 in | Wt 191.4 lb

## 2024-01-21 DIAGNOSIS — I8312 Varicose veins of left lower extremity with inflammation: Secondary | ICD-10-CM | POA: Insufficient documentation

## 2024-01-21 DIAGNOSIS — M7989 Other specified soft tissue disorders: Secondary | ICD-10-CM | POA: Insufficient documentation

## 2024-01-21 DIAGNOSIS — I872 Venous insufficiency (chronic) (peripheral): Secondary | ICD-10-CM | POA: Insufficient documentation

## 2024-01-21 DIAGNOSIS — I8311 Varicose veins of right lower extremity with inflammation: Secondary | ICD-10-CM

## 2024-01-21 DIAGNOSIS — I8393 Asymptomatic varicose veins of bilateral lower extremities: Secondary | ICD-10-CM

## 2024-01-21 NOTE — Progress Notes (Signed)
 Requested by:  Dwight Trula SQUIBB, MD [address] [place],  [place] [address]  Reason for consultation: venous insufficiency    History of Present Illness   [name] is a 68 y.o. (1955-07-22) female who presents for evaluation of BLE heaviness and some visibile varicose veins in both calves. She explains that she started to notice this over the past year. The heaviness seems to be worsening. She does report that she was a Occupational psychologist for many years and over past year she transitioned to a position that now has her sitting more. She denies any aching, swelling, throbbing, burning, itching, bleeding or ulceration. Prior history of sclerotherapy on one of her thighs many years ago. She does not recall which leg. She is scheduled to have right hip surgery in next couple weeks. She has been swimming several times a week because that is only exercise she can really tolerate at this time. She otherwise says she elevates her legs in recliner daily but admittedly not very high. She does not wear compression stockings. She says she did about 30 years ago when she was pregnant. She has no personal history of DVT. No family history of DVT or venous disease.   Venous symptoms include: heaviness, varicose veins Onset/duration:  1 year  Occupation:  Charity fundraiser at [employer] Aggravating factors: sitting, standing Alleviating factors: none Compression:  no Helps:  no Pain medications:  no Previous vein procedures:  sclerotherapy  History of DVT:  no  Past Medical History:  Diagnosis Date   Anxiety    Arthritis    Cervical syndrome    CKD (chronic kidney disease), stage III (HCC)    Constipation    COVID-19 03/26/2019   Family history of colon cancer 02/15/2020   Fatty liver    GERD (gastroesophageal reflux disease)    Granulosa cell tumor of ovary    History of hiatal hernia    Hyperlipidemia    Joint pain    Neck pain    Obesity    Osteoarthritis    Ovarian cancer (HCC)    PONV  (postoperative nausea and vomiting)    history of n/v,  past surgeries no n/v   Sleep apnea    uses CPAP   Tremor     Past Surgical History:  Procedure Laterality Date   ABDOMINAL HYSTERECTOMY     TAH/BSO   APPENDECTOMY     EXPLORATORY LAPAROTOMY     for bowel obstruction   IR IMAGING GUIDED PORT INSERTION  10/25/2020   IR REMOVAL TUN ACCESS W/ PORT W/O FL MOD SED  06/19/2021   left toe surgery Left    Secondary tumor debulking  2002   SHOULDER SURGERY     2017 left   Tumor debulking  2010   VENTRAL HERNIA REPAIR      Social History   Socioeconomic History   Marital status: Married    Spouse name: Mariesha Venturella   Number of children: 2   Years of education: college   Highest education level: Not on file  Occupational History   Occupation: RN  Tobacco Use   Smoking status: Never   Smokeless tobacco: Never  Vaping Use   Vaping status: Never Used  Substance and Sexual Activity   Alcohol use: Not Currently   Drug use: No   Sexual activity: Not Currently  Other Topics Concern   Not on file  Social History Narrative   Lives at home with husband.  Right-handed.   One cup caffeine daily.   Social Drivers of Corporate investment banker Strain: Not on file  Food Insecurity: Not on file  Transportation Needs: Not on file  Physical Activity: Not on file  Stress: Not on file  Social Connections: Not on file  Intimate Partner Violence: Not on file    Family History  Problem Relation Age of Onset   Basal cell carcinoma Mother        18s   Thyroid  disease Mother    Stroke Father    Colon cancer Father 43   Hypertension Father    Heart disease Father    Colon cancer Paternal Uncle        dx 78s   Colon cancer Cousin        maternal cousin; dx 56s   BRCA 1/2 Neg Hx    Breast cancer Neg Hx     Current Outpatient Medications  Medication Sig Dispense Refill   acetaminophen  (TYLENOL ) 500 MG tablet Take 1,000 mg by mouth every 6 (six) hours as needed for moderate  pain.     anastrozole  (ARIMIDEX ) 1 MG tablet Take 1 tablet (1 mg total) by mouth daily. 90 tablet 3   aspirin EC 81 MG tablet Take 81 mg by mouth daily. Swallow whole.     Biotin 5 MG TABS Take 5 mg by mouth daily.     buPROPion  (WELLBUTRIN  SR) 200 MG 12 hr tablet Take 1 tablet (200 mg total) by mouth 2 (two) times daily. 180 tablet 1   escitalopram  (LEXAPRO ) 20 MG tablet Take 1 tablet (20 mg total) by mouth daily. 90 tablet 2   gabapentin  (NEURONTIN ) 300 MG capsule Take 1 capsule (300 mg total) by mouth 2 (two) times daily. (Patient taking differently: Take 300 mg by mouth as needed (Per pt).) 60 capsule 3   isosorbide  mononitrate (IMDUR ) 30 MG 24 hr tablet Take 1 tablet (30 mg total) by mouth daily. 90 tablet 3   leuprolide  (LUPRON ) 30 MG injection Inject 30 mg into the muscle every 30 (thirty) days.     metFORMIN  (GLUCOPHAGE ) 500 MG tablet Take 2 tablets (1,000 mg total) by mouth daily with supper. 180 tablet 3   mirabegron  ER (MYRBETRIQ ) 50 MG TB24 tablet Take 1 tablet (50 mg total) by mouth daily. 30 tablet 5   mirabegron  ER (MYRBETRIQ ) 50 MG TB24 tablet Take 1 tablet (50 mg total) by mouth daily. 90 tablet 3   omeprazole  (PRILOSEC) 20 MG capsule Take 1 capsule (20 mg total) by mouth daily. 90 capsule 2   Polyethyl Glycol-Propyl Glycol (SYSTANE OP) Place 1 drop into both eyes daily as needed (dry eyes).     polyethylene glycol (MIRALAX / GLYCOLAX) 17 g packet Take 17 g by mouth daily.     Probiotic Product (ALIGN PO) Take 1 tablet by mouth daily.      tretinoin  (RETIN-A ) 0.025 % cream Apply 1 application to the face Nightly. 20 g 2   triamcinolone  cream (KENALOG ) 0.1 % Apply 1 small Application topically 2 (two) times daily to affected area. 454 g 0   Vitamin D , Ergocalciferol , (DRISDOL ) 1.25 MG (50000 UNIT) CAPS capsule Take 1 capsule by mouth every 10 days. 9 capsule 1   No current facility-administered medications for this visit.   Facility-Administered Medications Ordered in Other  Visits  Medication Dose Route Frequency Provider Last Rate Last Admin   leuprolide  (LUPRON ) injection 3.75 mg  3.75 mg Intramuscular Once [name], Ni, MD  Allergies  Allergen Reactions   Codeine Nausea And Vomiting   Thimerosal (Thiomersal) Other (See Comments)    Eye redness    Ciprofloxacin Other (See Comments)    FATIGUE   Levofloxacin Other (See Comments)    fatigue   Oxaprozin Rash and Dermatitis   Stadol [Butorphanol Tartrate] Other (See Comments)    ALTERED MENTAL STATUS   Sulfa Antibiotics Rash and Dermatitis    REVIEW OF SYSTEMS (negative unless checked):   Cardiac:  []  Chest pain or chest pressure? []  Shortness of breath upon activity? []  Shortness of breath when lying flat? []  Irregular heart rhythm?  Vascular:  []  Pain in calf, thigh, or hip brought on by walking? []  Pain in feet at night that wakes you up from your sleep? []  Blood clot in your veins? []  Leg swelling?  Pulmonary:  []  Oxygen at home? []  Productive cough? []  Wheezing?  Neurologic:  []  Sudden weakness in arms or legs? []  Sudden numbness in arms or legs? []  Sudden onset of difficult speaking or slurred speech? []  Temporary loss of vision in one eye? []  Problems with dizziness?  Gastrointestinal:  []  Blood in stool? []  Vomited blood?  Genitourinary:  []  Burning when urinating? []  Blood in urine?  Psychiatric:  []  Major depression  Hematologic:  []  Bleeding problems? []  Problems with blood clotting?  Dermatologic:  []  Rashes or ulcers?  Constitutional:  []  Fever or chills?  Ear/Nose/Throat:  []  Change in hearing? []  Nose bleeds? []  Sore throat?  Musculoskeletal:  []  Back pain? []  Joint pain? []  Muscle pain?   Physical Examination     Vitals:   01/21/24 1041  BP: 112/65  Pulse: 60  Resp: 18  Temp: 97.8 F (36.6 C)  TempSrc: Temporal  SpO2: 98%  Weight: 191 lb 6.4 oz (86.8 kg)  Height: 5' 6 (1.676 m)   Body mass index is 30.89 kg/m.  General:   WDWN in NAD; vital signs documented above Gait: Not observed HENT: WNL, normocephalic Pulmonary: normal non-labored breathing , without Rales, rhonchi, wheezing Cardiac: regular HR Abdomen: soft Vascular Exam/Pulses: Extremities: with varicose veins of posterior right calf, with reticular veins bilateral thighs and popliteal fossa, without edema, without stasis pigmentation, without lipodermatosclerosis, without ulcers Musculoskeletal: no muscle wasting or atrophy  Neurologic: A&O X 3;  No focal weakness or paresthesias are detected Psychiatric:  The pt has Normal affect.  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 female who presents with: LLE chronic venous insufficiency with heaviness and varicose veins of BLE. Duplex shows no DVT or SVT. She has some reflux in GSV at the [facility] and deep reflux in the [facility]. No SSV reflux. 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 This is asymptomatic. She does not recall any calf pain, swelling, or redness at any time. Her GSV is very small on duplex today. Based on her duplex she would not be candidate for a venous ablation Based on the patient's history and examination, I recommend: daily elevation of 20-30 minutes above level of heart, daily compression stocking use, exercise, weight reduction, refraining from prolonged sitting or standing. I discussed with the patient the use of her 15-20 mm knee high compression stockings. She was measured and fitted  for pair at today's visit. She can follow up as needed if she has any new or concerning symptoms   Teretha Damme, PA-C [facility] Office: [phone]  01/21/2024, 11:08 AM  Clinic MD: Sheree

## 2024-01-23 ENCOUNTER — Other Ambulatory Visit (HOSPITAL_COMMUNITY): Payer: Self-pay

## 2024-01-23 ENCOUNTER — Other Ambulatory Visit: Payer: Self-pay

## 2024-01-23 MED ORDER — TRAMADOL HCL 50 MG PO TABS
50.0000 mg | ORAL_TABLET | Freq: Four times a day (QID) | ORAL | 0 refills | Status: DC | PRN
  Filled 2024-01-23: qty 20, 5d supply, fill #0

## 2024-01-28 ENCOUNTER — Inpatient Hospital Stay

## 2024-01-28 ENCOUNTER — Other Ambulatory Visit (HOSPITAL_COMMUNITY): Payer: Self-pay

## 2024-01-28 ENCOUNTER — Inpatient Hospital Stay: Attending: Hematology

## 2024-01-28 VITALS — BP 133/50 | HR 60 | Temp 98.6°F | Resp 18

## 2024-01-28 DIAGNOSIS — C569 Malignant neoplasm of unspecified ovary: Secondary | ICD-10-CM

## 2024-01-28 LAB — COMPREHENSIVE METABOLIC PANEL WITH GFR
ALT: 19 U/L (ref 0–44)
AST: 22 U/L (ref 15–41)
Albumin: 4.4 g/dL (ref 3.5–5.0)
Alkaline Phosphatase: 77 U/L (ref 38–126)
Anion gap: 7 (ref 5–15)
BUN: 21 mg/dL (ref 8–23)
CO2: 29 mmol/L (ref 22–32)
Calcium: 9.9 mg/dL (ref 8.9–10.3)
Chloride: 104 mmol/L (ref 98–111)
Creatinine, Ser: 1.07 mg/dL — ABNORMAL HIGH (ref 0.44–1.00)
GFR, Estimated: 57 mL/min — ABNORMAL LOW (ref >60)
Glucose, Bld: 91 mg/dL (ref 70–99)
Potassium: 3.8 mmol/L (ref 3.5–5.1)
Sodium: 140 mmol/L (ref 135–145)
Total Bilirubin: 0.4 mg/dL (ref 0.0–1.2)
Total Protein: 7.5 g/dL (ref 6.5–8.1)

## 2024-01-28 LAB — CBC WITH DIFFERENTIAL/PLATELET
Abs Immature Granulocytes: 0.01 K/uL (ref 0.00–0.07)
Basophils Absolute: 0.1 K/uL (ref 0.0–0.1)
Basophils Relative: 1 %
Eosinophils Absolute: 0.2 K/uL (ref 0.0–0.5)
Eosinophils Relative: 3 %
HCT: 39.7 % (ref 36.0–46.0)
Hemoglobin: 13.5 g/dL (ref 12.0–15.0)
Immature Granulocytes: 0 %
Lymphocytes Relative: 35 %
Lymphs Abs: 2 K/uL (ref 0.7–4.0)
MCH: 28.3 pg (ref 26.0–34.0)
MCHC: 34 g/dL (ref 30.0–36.0)
MCV: 83.2 fL (ref 80.0–100.0)
Monocytes Absolute: 0.5 K/uL (ref 0.1–1.0)
Monocytes Relative: 8 %
Neutro Abs: 2.9 K/uL (ref 1.7–7.7)
Neutrophils Relative %: 53 %
Platelets: 350 K/uL (ref 150–400)
RBC: 4.77 MIL/uL (ref 3.87–5.11)
RDW: 13.5 % (ref 11.5–15.5)
WBC: 5.6 K/uL (ref 4.0–10.5)
nRBC: 0 % (ref 0.0–0.2)

## 2024-01-28 MED ORDER — LEUPROLIDE ACETATE 3.75 MG IM KIT
3.7500 mg | PACK | Freq: Once | INTRAMUSCULAR | Status: AC
  Administered 2024-01-28: 3.75 mg via INTRAMUSCULAR
  Filled 2024-01-28: qty 3.75

## 2024-01-29 ENCOUNTER — Encounter: Payer: Self-pay | Admitting: Hematology and Oncology

## 2024-01-29 ENCOUNTER — Other Ambulatory Visit: Payer: Self-pay

## 2024-01-29 ENCOUNTER — Other Ambulatory Visit (HOSPITAL_COMMUNITY): Payer: Self-pay

## 2024-01-29 DIAGNOSIS — M1611 Unilateral primary osteoarthritis, right hip: Secondary | ICD-10-CM | POA: Diagnosis not present

## 2024-01-29 MED ORDER — DOCUSATE SODIUM 100 MG PO CAPS
100.0000 mg | ORAL_CAPSULE | Freq: Every day | ORAL | 0 refills | Status: DC
  Filled 2024-01-29: qty 30, 30d supply, fill #0

## 2024-01-29 MED ORDER — TIZANIDINE HCL 2 MG PO TABS
2.0000 mg | ORAL_TABLET | Freq: Four times a day (QID) | ORAL | 0 refills | Status: AC
Start: 2024-01-29 — End: ?
  Filled 2024-01-29: qty 60, 15d supply, fill #0

## 2024-01-29 MED ORDER — CELECOXIB 200 MG PO CAPS
200.0000 mg | ORAL_CAPSULE | Freq: Two times a day (BID) | ORAL | 0 refills | Status: DC
  Filled 2024-01-29: qty 60, 30d supply, fill #0

## 2024-01-29 MED ORDER — OXYCODONE HCL 5 MG PO TABS
5.0000 mg | ORAL_TABLET | ORAL | 0 refills | Status: DC
  Filled 2024-01-29: qty 30, 5d supply, fill #0

## 2024-01-29 MED ORDER — ASPIRIN 325 MG PO TABS
325.0000 mg | ORAL_TABLET | Freq: Every day | ORAL | 0 refills | Status: DC
  Filled 2024-01-29: qty 60, 60d supply, fill #0

## 2024-01-30 ENCOUNTER — Other Ambulatory Visit (HOSPITAL_COMMUNITY): Payer: Self-pay

## 2024-01-30 ENCOUNTER — Encounter: Payer: Self-pay | Admitting: Pharmacist

## 2024-01-30 ENCOUNTER — Other Ambulatory Visit: Payer: Self-pay

## 2024-01-30 ENCOUNTER — Ambulatory Visit (HOSPITAL_BASED_OUTPATIENT_CLINIC_OR_DEPARTMENT_OTHER)
Admission: RE | Admit: 2024-01-30 | Discharge: 2024-01-30 | Disposition: A | Discharge status: Home / Self Care | Source: Ambulatory Visit | Attending: Hematology and Oncology | Admitting: Hematology and Oncology

## 2024-01-30 DIAGNOSIS — M810 Age-related osteoporosis without current pathological fracture: Secondary | ICD-10-CM | POA: Diagnosis not present

## 2024-01-30 DIAGNOSIS — C569 Malignant neoplasm of unspecified ovary: Secondary | ICD-10-CM | POA: Diagnosis not present

## 2024-01-30 DIAGNOSIS — Z780 Asymptomatic menopausal state: Secondary | ICD-10-CM | POA: Diagnosis not present

## 2024-01-30 MED ORDER — BUPROPION HCL ER (SR) 200 MG PO TB12
200.0000 mg | ORAL_TABLET | Freq: Two times a day (BID) | ORAL | 1 refills | Status: DC
  Filled 2024-01-30: qty 60, 30d supply, fill #0
  Filled 2024-03-09: qty 60, 30d supply, fill #1

## 2024-01-30 MED ORDER — ESCITALOPRAM OXALATE 20 MG PO TABS
20.0000 mg | ORAL_TABLET | Freq: Every day | ORAL | 2 refills | Status: AC
  Filled 2024-01-30: qty 90, 90d supply, fill #0
  Filled 2024-05-16: qty 90, 90d supply, fill #1

## 2024-02-02 ENCOUNTER — Other Ambulatory Visit: Payer: Self-pay

## 2024-02-02 ENCOUNTER — Other Ambulatory Visit (HOSPITAL_COMMUNITY): Payer: Self-pay

## 2024-02-03 ENCOUNTER — Other Ambulatory Visit: Payer: Self-pay

## 2024-02-03 ENCOUNTER — Other Ambulatory Visit (HOSPITAL_COMMUNITY): Payer: Self-pay

## 2024-02-03 ENCOUNTER — Telehealth: Payer: Self-pay

## 2024-02-03 MED ORDER — ONDANSETRON HCL 8 MG PO TABS
8.0000 mg | ORAL_TABLET | Freq: Three times a day (TID) | ORAL | 1 refills | Status: AC | PRN
Start: 2024-02-03 — End: ?
  Filled 2024-02-03: qty 60, 20d supply, fill #0

## 2024-02-03 NOTE — Telephone Encounter (Signed)
 She called and yesterday and all night she had nausea.   Still having some nausea today and may need to some Zofran  tonight. Rx has expired. Sent new Rx to her preferred pharmacy.

## 2024-02-11 DIAGNOSIS — M1611 Unilateral primary osteoarthritis, right hip: Secondary | ICD-10-CM | POA: Diagnosis not present

## 2024-02-20 ENCOUNTER — Ambulatory Visit (INDEPENDENT_AMBULATORY_CARE_PROVIDER_SITE_OTHER)
Admission: RE | Admit: 2024-02-20 | Discharge: 2024-02-20 | Disposition: A | Discharge status: Home / Self Care | Source: Ambulatory Visit | Attending: Hematology and Oncology | Admitting: Hematology and Oncology

## 2024-02-20 ENCOUNTER — Ambulatory Visit (HOSPITAL_COMMUNITY)

## 2024-02-20 DIAGNOSIS — C569 Malignant neoplasm of unspecified ovary: Secondary | ICD-10-CM | POA: Diagnosis not present

## 2024-02-20 DIAGNOSIS — Z90710 Acquired absence of both cervix and uterus: Secondary | ICD-10-CM | POA: Diagnosis not present

## 2024-02-20 DIAGNOSIS — Z96641 Presence of right artificial hip joint: Secondary | ICD-10-CM | POA: Diagnosis not present

## 2024-02-20 DIAGNOSIS — I700 Atherosclerosis of aorta: Secondary | ICD-10-CM | POA: Diagnosis not present

## 2024-02-20 DIAGNOSIS — K5730 Diverticulosis of large intestine without perforation or abscess without bleeding: Secondary | ICD-10-CM | POA: Diagnosis not present

## 2024-02-20 DIAGNOSIS — E278 Other specified disorders of adrenal gland: Secondary | ICD-10-CM | POA: Diagnosis not present

## 2024-02-20 DIAGNOSIS — M810 Age-related osteoporosis without current pathological fracture: Secondary | ICD-10-CM | POA: Diagnosis not present

## 2024-02-20 DIAGNOSIS — N281 Cyst of kidney, acquired: Secondary | ICD-10-CM | POA: Diagnosis not present

## 2024-02-20 DIAGNOSIS — K439 Ventral hernia without obstruction or gangrene: Secondary | ICD-10-CM | POA: Diagnosis not present

## 2024-02-20 MED ORDER — IOHEXOL 300 MG/ML  SOLN
100.0000 mL | Freq: Once | INTRAMUSCULAR | Status: AC | PRN
  Administered 2024-02-20: 100 mL via INTRAVENOUS

## 2024-02-23 DIAGNOSIS — M25551 Pain in right hip: Secondary | ICD-10-CM | POA: Diagnosis not present

## 2024-02-26 ENCOUNTER — Other Ambulatory Visit (HOSPITAL_COMMUNITY): Payer: Self-pay

## 2024-02-27 ENCOUNTER — Encounter: Payer: Self-pay | Admitting: Hematology and Oncology

## 2024-02-27 ENCOUNTER — Inpatient Hospital Stay

## 2024-02-27 ENCOUNTER — Inpatient Hospital Stay: Attending: Hematology | Admitting: Hematology and Oncology

## 2024-02-27 ENCOUNTER — Encounter: Payer: Self-pay | Admitting: Oncology

## 2024-02-27 VITALS — BP 128/58 | HR 58 | Temp 98.1°F | Resp 17 | Wt 189.8 lb

## 2024-02-27 DIAGNOSIS — M8580 Other specified disorders of bone density and structure, unspecified site: Secondary | ICD-10-CM | POA: Insufficient documentation

## 2024-02-27 DIAGNOSIS — C569 Malignant neoplasm of unspecified ovary: Secondary | ICD-10-CM

## 2024-02-27 DIAGNOSIS — N183 Chronic kidney disease, stage 3 unspecified: Secondary | ICD-10-CM | POA: Diagnosis not present

## 2024-02-27 LAB — CBC WITH DIFFERENTIAL/PLATELET
Abs Immature Granulocytes: 0.01 K/uL (ref 0.00–0.07)
Basophils Absolute: 0.1 K/uL (ref 0.0–0.1)
Basophils Relative: 2 %
Eosinophils Absolute: 0.3 K/uL (ref 0.0–0.5)
Eosinophils Relative: 5 %
HCT: 37.9 % (ref 36.0–46.0)
Hemoglobin: 12.2 g/dL (ref 12.0–15.0)
Immature Granulocytes: 0 %
Lymphocytes Relative: 30 %
Lymphs Abs: 1.7 K/uL (ref 0.7–4.0)
MCH: 27.6 pg (ref 26.0–34.0)
MCHC: 32.2 g/dL (ref 30.0–36.0)
MCV: 85.7 fL (ref 80.0–100.0)
Monocytes Absolute: 0.4 K/uL (ref 0.1–1.0)
Monocytes Relative: 8 %
Neutro Abs: 3.1 K/uL (ref 1.7–7.7)
Neutrophils Relative %: 55 %
Platelets: 525 K/uL — ABNORMAL HIGH (ref 150–400)
RBC: 4.42 MIL/uL (ref 3.87–5.11)
RDW: 13.2 % (ref 11.5–15.5)
WBC: 5.5 K/uL (ref 4.0–10.5)
nRBC: 0 % (ref 0.0–0.2)

## 2024-02-27 LAB — COMPREHENSIVE METABOLIC PANEL WITH GFR
ALT: 9 U/L (ref 0–44)
AST: 15 U/L (ref 15–41)
Albumin: 3.9 g/dL (ref 3.5–5.0)
Alkaline Phosphatase: 101 U/L (ref 38–126)
Anion gap: 8 (ref 5–15)
BUN: 22 mg/dL (ref 8–23)
CO2: 28 mmol/L (ref 22–32)
Calcium: 9.4 mg/dL (ref 8.9–10.3)
Chloride: 102 mmol/L (ref 98–111)
Creatinine, Ser: 1.24 mg/dL — ABNORMAL HIGH (ref 0.44–1.00)
GFR, Estimated: 48 mL/min — ABNORMAL LOW (ref >60)
Glucose, Bld: 102 mg/dL — ABNORMAL HIGH (ref 70–99)
Potassium: 4.2 mmol/L (ref 3.5–5.1)
Sodium: 138 mmol/L (ref 135–145)
Total Bilirubin: 0.5 mg/dL (ref 0.0–1.2)
Total Protein: 7 g/dL (ref 6.5–8.1)

## 2024-02-27 MED ORDER — LEUPROLIDE ACETATE 3.75 MG IM KIT
3.7500 mg | PACK | Freq: Once | INTRAMUSCULAR | Status: AC
  Administered 2024-02-27: 3.75 mg via INTRAMUSCULAR
  Filled 2024-02-27: qty 3.75

## 2024-02-27 NOTE — Assessment & Plan Note (Addendum)
 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  She had relapse in 2002, resected followed by 6 cycles of cisplatin and etoposide and then have disease relapse again in 2010.  She has at least 4 abdominal surgeries over the span of 30 years Pathology: Granulosa cell tumor of the ovary, 2018 - Core biopsy for ER/PR and Foundation One testing performed. Unfortunately, no sufficient tissue for Foundation One testing. ER was 50% and PR 90%. Between 2010-2025, Progressed on carboplatin , exemestane , Avastin ,Tamoxifen /Megace  and letrozole , mixed response on Lupron   Her treatment course is stabilized currently on combination of Lupron  monthly and daily anastrozole  I reviewed CT imaging from October 2025 which showed majority of the lesions are stable/improved but the left pelvic lymph node is considerably enlarged  So far, she tolerated treatment well without major side effects She is reluctant to pursue chemotherapy I recommend consideration for radiation oncology consultation to see if this necrotic looking lymph node can be irradiated Due to extensive surgeries in the past, I do not think she would be a good surgical candidate at this point She will continue Lupron  monthly and daily anastrozole  for now  I also reviewed results of her bone mineral density scan which show borderline osteopenia She will continue calcium  with vitamin D  supplement only for now

## 2024-02-27 NOTE — Assessment & Plan Note (Addendum)
Her renal function is stable/slightly fluctuated Observe closely Discussed importance of oral fluid hydration

## 2024-02-27 NOTE — Progress Notes (Signed)
 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  [name] had relapse in 2002, resected followed by 6 cycles of cisplatin and etoposide and then have disease relapse again in 2010.  [name] has at least 4 abdominal surgeries over the span of 30 years Pathology: Granulosa cell tumor of the ovary, 2018 - Core biopsy for ER/PR and Foundation One testing performed. Unfortunately, no sufficient tissue for Foundation One testing. ER was 50% and PR 90%. Between 2010-2025, Progressed on carboplatin , exemestane , Avastin ,Tamoxifen /Megace  and letrozole , mixed response on Lupron   Her treatment course is stabilized currently on combination of Lupron  monthly and daily anastrozole  I reviewed CT imaging from October 2025 which showed majority of the lesions are stable/improved but the left pelvic lymph node is considerably enlarged  So far, [name] tolerated treatment well without major side effects [name] is reluctant to pursue chemotherapy I recommend consideration for radiation oncology consultation to see if this necrotic looking lymph node can be irradiated Due to extensive surgeries in the past, I do not think [name] would be a good surgical candidate at this point [name] will continue Lupron  monthly and daily anastrozole  for now  I also reviewed results of her bone mineral density scan which show borderline osteopenia [name] will continue calcium  with vitamin D  supplement only for now Stage 3 chronic kidney disease, unspecified whether stage 3a or 3b CKD (HCC) Her renal function is stable/slightly fluctuated Observe closely Discussed importance of oral fluid hydration  No orders of the defined types were  placed in this encounter.    Almarie Bedford, MD  INTERVAL HISTORY: [name] returns for treatment follow-up on anastrozole  and Lupron  injection Complications related to previous cycle of chemotherapy included none We discussed and reviewed multiple imaging studies  PHYSICAL EXAMINATION: ECOG PERFORMANCE STATUS: 0 - Asymptomatic  No results found for: [id number]    Latest Ref Rng & Units 02/27/2024   10:14 AM 01/28/2024    8:36 AM 01/02/2024    2:53 PM  CBC  WBC 4.0 - 10.5 K/uL 5.5  5.6  5.0   Hemoglobin 12.0 - 15.0 g/dL 87.7  86.4  87.5   Hematocrit 36.0 - 46.0 % 37.9  39.7  37.5   Platelets 150 - 400 K/uL 525  350  335       Chemistry      Component Value Date/Time   NA 138 02/27/2024 1014   NA 140 07/09/2021 0813   NA 141 09/24/2013 1131   K 4.2 02/27/2024 1014   K 4.5 09/24/2013 1131   CL 102 02/27/2024 1014   CO2 28 02/27/2024 1014   CO2 24 09/24/2013 1131   BUN 22 02/27/2024 1014   BUN 20 07/09/2021 0813   BUN 21.2 09/24/2013 1131   CREATININE 1.24 (H) 02/27/2024 1014   CREATININE 1.15 (H) 08/28/2023 0904   CREATININE 1.1 09/24/2013 1131      Component Value Date/Time   CALCIUM  9.4 02/27/2024 1014   CALCIUM  9.5 09/24/2013 1131   ALKPHOS 101 02/27/2024 1014   ALKPHOS 81 09/24/2013 1131   AST 15 02/27/2024 1014   AST 19 08/28/2023 0904   AST 23 09/24/2013 1131   ALT 9 02/27/2024 1014   ALT 16 08/28/2023 0904  ALT 27 09/24/2013 1131   BILITOT 0.5 02/27/2024 1014   BILITOT 0.4 08/28/2023 0904   BILITOT 0.53 09/24/2013 1131       Vitals:   02/27/24 1040  BP: (!) 128/58  Pulse: (!) 58  Resp: 17  Temp: 98.1 F (36.7 C)  SpO2: 98%   Filed Weights   02/27/24 1040  Weight: 189 lb 12.8 oz (86.1 kg)   Other relevant data reviewed during this visit included CBC, CMP, bone density scan, CT imaging

## 2024-02-27 NOTE — Progress Notes (Signed)
 Referral placed to radiation oncology per Dr. Lonn.

## 2024-03-01 ENCOUNTER — Telehealth: Payer: Self-pay | Admitting: Hematology and Oncology

## 2024-03-01 NOTE — Telephone Encounter (Signed)
 left vm for pt to call back to schedule appts

## 2024-03-05 NOTE — Progress Notes (Signed)
 GYN Location of Tumor / Histology: left pelvic lymph node  [name] presented with symptoms of:   Biopsies revealed:   Past/Anticipated interventions by Gyn/Onc surgery, if any:   Past/Anticipated interventions by medical oncology, if any:    Weight changes, if any: no  Bowel/Bladder complaints, if any: Yes.  , constipation at times which is relieved with Miralax.   Nausea/Vomiting, if any: no  Pain issues, if any:  Yes, left hip due to recent surgery.   SAFETY ISSUES: Prior radiation? no Pacemaker/ICD? no Possible current pregnancy? no Is the patient on methotrexate? no  Current Complaints / other details:    BP (!) 143/76 (BP Location: Left Arm, Patient Position: Sitting)   Pulse 63   Temp (!) 96.6 F (35.9 C) (Temporal)   Resp 18   Ht 5' 6 (1.676 m)   Wt 190 lb 6 oz (86.4 kg)   SpO2 99%   BMI 30.73 kg/m

## 2024-03-06 NOTE — Progress Notes (Signed)
 Radiation Oncology         [phone] ________________________________  Initial Outpatient Consultation  Name: [name] MRN: [id number]  Date: 03/08/2024  DOB: [birth year]  RR:Mjgl, Trula SQUIBB, MD  Lonn Hicks, MD   REFERRING PHYSICIAN: Lonn Hicks, MD  DIAGNOSIS: There were no encounter diagnoses.  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 female who is accompanied by ***. she is seen as a courtesy of Dr. 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 She has a very extensive cancer history which is outlined as follows.   She was initially diagnosed with granulosa cell tumor of the left ovary in 1994 which was treated with a left salpingo oophorectomy. She later underwent a total hysterectomy and right salpingo oophorectomy in 2000. She then developed recurrent/metastatic disease in 2002 that manifested in an omental nodule, diaphragm nodule, and peritoneal adhesion, s/p 6 cycles of cisplatin and etoposide. She unfortunately developed another recurrence in 2010 in a pelvic nodule and pericecal mass. These were excised and she received further systemic which consisted of an alternating course of Megace  and Tamoxifen . Megace  and Tamoxifen  were discontinued in December of 2013 after a restaging CT CAP that month showed  progressive disease with a increasing peritoneal tumor with enlarging implants in the subhepatic space and pelvis and an enlarged left external iliac lymph node.  Her treatment was then transitioned to letrozole  and her disease remained stable until she developed progressive disease in October of 2017. Her treatment was again changed to an alternating combination of tamoxifen  and Megace .   Despite multiple lines of systemic therapy her hepatic metastatic disease and peritoneal implants in the pelvis again progressive in 2018 (ER/PR positive). She was treated with Lupron ; with imaging in August of 2019 showing an overall mixed response to therapy characterized by mild improvement in the mixed solid / cystic lesions in the left pelvis, mild progression of the cystic peritoneal disease and in the dominant lesion along the right posterior hepatic lobe, but stability of a subcapsular lesion along the posterior right hepatic lobe. Her next restaging scan in December of 2019 consisting of an MRI of the abdomen and pelvis showed overall stable disease.   Subsequent follow-up MRI's of the abdomen and pelvis in May and October of 2020 showed relatively stable disease. Her next restaging MRI AP in February of 2021 however showed slight enlargement of a dominant cystic area and solid component, associated with rind like signal variation on T2 along the inferior right hepatic margin, as well as evidence of extensive retroperitoneal and pelvic lymphadenopathy. The pelvic implants otherwise appeared similar in size.   She was then treated with chemotherapy consisting of carboplatin  from 06/24/2019 through 09/02/2019. Unfortunately, her next restaging scan on 09/23/2019 showed evidence of disease progression, characterized by: interval progression of the soft tissue lesions in the anterior left pelvis, likely representing peritoneal  implants. The other sites of disease along the liver capsule and posterior spleen otherwise  appeared stable. Her next restaging scan on 12/23/2019 showed: a slight interval increase in size of a mixed solid and cystic nodule in the left hemipelvis, and enlargement of a dominant left pelvic nodule concerning for disease progression.  The remainder of her disease appeared stable otherwise.   Despite these progressive findings, her next follow-up can in December of 2021 showed relatively stable findings overall. Her next scan on 10/18/2020 however showed interval progression of peritoneal nodules along the left pelvic sidewall concerning for progressive metastatic disease. Imaging otherwise showed relatively stable disease in the abdomen and pelvis.  She subsequently received another line of chemotherapy consisting of once monthly paclitaxel  from 10/31/20 through 10/04/2021. She was then transitioned to once monthly lupron  w/ daily anastrozole ; with her restaging CT AP in October of 2023 showed relatively stable disease findings.  Her follow-up scans throughout 2024 continued to show stable disease findings. In this setting, Dr. Lonn ultimately recommended continuing with lupron  and antiestrogen therapy indefinitely. A restaging scan on 11/21/22 did show some mild disease progression in a left iliac lymph node and inferior left paracolic gutter nodule. However, given that her other disease sites appeared stable, Dr. Lonn recommended continuing with her same treatment regimen.   Her first restaging CT AP of 2025, performed on 08/20/23, continued to show stable disease in the pelvis, and she has continued with once monthly Lupron  with daily anastrozole .   Her most recent restaging CT AP on 02/20/24 however demonstrated a rather significant increase in size of the left external iliac lesion, measuring 4.4 x 3.3 cm, previously measuring 2.2 x 1.9 cm. CT findings also conveyed a mixed response of the peritoneal/capsular implants, characterized by a decrease in size of the dominant sub/pericapsular  nodules along the hepatic capsule, an increase in size of a few additional peritoneal implants, and stability of the splenic capsule implant.   The patient is understandably reluctant to undergo further chemotherapy given her extensive treatment history (w/ multiple episodes of disease progression). In this setting, Dr. Lonn has recommended considering radiation therapy to her progressive disease which we will discuss in detail together today.   In the mean time, Dr. Lonn would like her to continue with her current treatment regimen of once monthly lupron  w/ daily anastrozole .   PREVIOUS RADIATION THERAPY: No  PAST MEDICAL HISTORY:  Past Medical History:  Diagnosis Date   Anxiety    Arthritis    Cervical syndrome    CKD (chronic kidney disease), stage III (HCC)    Constipation    COVID-19 03/26/2019   Family history of colon cancer 02/15/2020   Fatty liver    GERD (gastroesophageal reflux disease)    Granulosa cell tumor of ovary    History of hiatal hernia    Hyperlipidemia    Joint pain    Neck pain    Obesity    Osteoarthritis    Ovarian cancer (HCC)    PONV (postoperative nausea and vomiting)    history of n/v,  past surgeries no n/v   Sleep apnea    uses CPAP   Tremor     PAST SURGICAL HISTORY: Past Surgical History:  Procedure Laterality Date   ABDOMINAL HYSTERECTOMY     TAH/BSO   APPENDECTOMY     EXPLORATORY LAPAROTOMY     for bowel obstruction   IR IMAGING GUIDED PORT INSERTION  10/25/2020   IR REMOVAL TUN ACCESS W/ PORT W/O  FL MOD SED  06/19/2021   left toe surgery Left    Secondary tumor debulking  2002   SHOULDER SURGERY     2017 left   Tumor debulking  2010   VENTRAL HERNIA REPAIR      FAMILY HISTORY:  Family History  Problem Relation Age of Onset   Basal cell carcinoma Mother        31s   Thyroid  disease Mother    Stroke Father    Colon cancer Father 48   Hypertension Father    Heart disease Father    Colon cancer Paternal Uncle         dx 11s   Colon cancer Cousin        maternal cousin; dx 31s   BRCA 1/2 Neg Hx    Breast cancer Neg Hx     SOCIAL HISTORY:  Social History   Tobacco Use   Smoking status: Never   Smokeless tobacco: Never  Vaping Use   Vaping status: Never Used  Substance Use Topics   Alcohol use: Not Currently   Drug use: No    ALLERGIES:  Allergies  Allergen Reactions   Codeine Nausea And Vomiting   Thimerosal (Thiomersal) Other (See Comments)    Eye redness    Ciprofloxacin Other (See Comments)    FATIGUE   Levofloxacin Other (See Comments)    fatigue   Oxaprozin Rash and Dermatitis   Stadol [Butorphanol Tartrate] Other (See Comments)    ALTERED MENTAL STATUS   Sulfa Antibiotics Rash and Dermatitis    MEDICATIONS:  Current Outpatient Medications  Medication Sig Dispense Refill   acetaminophen  (TYLENOL ) 500 MG tablet Take 1,000 mg by mouth every 6 (six) hours as needed for moderate pain.     anastrozole  (ARIMIDEX ) 1 MG tablet Take 1 tablet (1 mg total) by mouth daily. 90 tablet 3   aspirin 325 MG tablet Take 1 tablet (325 mg total) by mouth daily for 1 month. 60 tablet 0   aspirin EC 81 MG tablet Take 81 mg by mouth daily. Swallow whole.     Biotin 5 MG TABS Take 5 mg by mouth daily.     buPROPion  (WELLBUTRIN  SR) 200 MG 12 hr tablet Take 1 tablet (200 mg total) by mouth 2 (two) times daily. 60 tablet 1   docusate sodium (COLACE) 100 MG capsule Take 1 capsule (100 mg total) by mouth daily. 30 capsule 0   escitalopram  (LEXAPRO ) 20 MG tablet Take 1 tablet (20 mg total) by mouth daily. 90 tablet 2   gabapentin  (NEURONTIN ) 300 MG capsule Take 1 capsule (300 mg total) by mouth 2 (two) times daily. (Patient taking differently: Take 300 mg by mouth as needed (Per pt).) 60 capsule 3   isosorbide  mononitrate (IMDUR ) 30 MG 24 hr tablet Take 1 tablet (30 mg total) by mouth daily. 90 tablet 3   leuprolide  (LUPRON ) 30 MG injection Inject 30 mg into the muscle every 30 (thirty) days.     metFORMIN   (GLUCOPHAGE ) 500 MG tablet Take 2 tablets (1,000 mg total) by mouth daily with supper. 180 tablet 3   mirabegron  ER (MYRBETRIQ ) 50 MG TB24 tablet Take 1 tablet (50 mg total) by mouth daily. 90 tablet 3   omeprazole  (PRILOSEC) 20 MG capsule Take 1 capsule (20 mg total) by mouth daily. 90 capsule 2   ondansetron  (ZOFRAN ) 8 MG tablet Take 1 tablet (8 mg total) by mouth every 8 (eight) hours as needed for nausea or vomiting. 60 tablet  1   oxyCODONE  (OXY IR/ROXICODONE ) 5 MG immediate release tablet Take 1 tablet (5 mg total) by mouth every 4 (four) hours for pain. 30 tablet 0   Polyethyl Glycol-Propyl Glycol (SYSTANE OP) Place 1 drop into both eyes daily as needed (dry eyes).     polyethylene glycol (MIRALAX / GLYCOLAX) 17 g packet Take 17 g by mouth daily.     Probiotic Product (ALIGN PO) Take 1 tablet by mouth daily.      tiZANidine (ZANAFLEX) 2 MG tablet Take 1 tablet (2 mg total) by mouth every 6 (six) hours. 60 tablet 0   tretinoin  (RETIN-A ) 0.025 % cream Apply 1 application to the face Nightly. 20 g 2   triamcinolone  cream (KENALOG ) 0.1 % Apply 1 small Application topically 2 (two) times daily to affected area. 454 g 0   Vitamin D , Ergocalciferol , (DRISDOL ) 1.25 MG (50000 UNIT) CAPS capsule Take 1 capsule by mouth every 10 days. 9 capsule 1   No current facility-administered medications for this encounter.   Facility-Administered Medications Ordered in Other Encounters  Medication Dose Route Frequency Provider Last Rate Last Admin   leuprolide  (LUPRON ) injection 3.75 mg  3.75 mg Intramuscular Once [name], Ni, MD        REVIEW OF SYSTEMS:  A 10+ POINT REVIEW OF SYSTEMS WAS OBTAINED including neurology, dermatology, psychiatry, cardiac, respiratory, lymph, extremities, GI, GU, musculoskeletal, constitutional, reproductive, HEENT. ***   PHYSICAL EXAM:  vitals were not taken for this visit.   General: Alert and oriented, in no acute distress HEENT: Head is normocephalic. Extraocular movements  are intact. Oropharynx is clear. Neck: Neck is supple, no palpable cervical or supraclavicular lymphadenopathy. Heart: Regular in rate and rhythm with no murmurs, rubs, or gallops. Chest: Clear to auscultation bilaterally, with no rhonchi, wheezes, or rales. Abdomen: Soft, nontender, nondistended, with no rigidity or guarding. Extremities: No cyanosis or edema. Lymphatics: see Neck Exam Skin: No concerning lesions. Musculoskeletal: symmetric strength and muscle tone throughout. Neurologic: Cranial nerves II through XII are grossly intact. No obvious focalities. Speech is fluent. Coordination is intact. Psychiatric: Judgment and insight are intact. Affect is appropriate.  ***  ECOG = ***  0 - Asymptomatic (Fully active, able to carry on all predisease activities without restriction)  1 - Symptomatic but completely ambulatory (Restricted in physically strenuous activity but ambulatory and able to carry out work of a light or sedentary nature. For example, light housework, office work)  2 - Symptomatic, <50% in bed during the day (Ambulatory and capable of all self care but unable to carry out any work activities. Up and about more than 50% of waking hours)  3 - Symptomatic, >50% in bed, but not bedbound (Capable of only limited self-care, confined to bed or chair 50% or more of waking hours)  4 - Bedbound (Completely disabled. Cannot carry on any self-care. Totally confined to bed or chair)  5 - Death   Raylene MM, Creech RH, Tormey DC, et al. [phone]). Toxicity and response criteria of the [facility]. Am. DOROTHA Bridges. Oncol. 5 (6): 649-55  LABORATORY DATA:  Lab Results  Component Value Date   WBC 5.5 02/27/2024   HGB 12.2 02/27/2024   HCT 37.9 02/27/2024   MCV 85.7 02/27/2024   PLT 525 (H) 02/27/2024   NEUTROABS 3.1 02/27/2024   Lab Results  Component Value Date   NA 138 02/27/2024   K 4.2 02/27/2024   CL 102 02/27/2024   CO2 28 02/27/2024   GLUCOSE 102  (  H) 02/27/2024   BUN 22 02/27/2024   CREATININE 1.24 (H) 02/27/2024   CALCIUM  9.4 02/27/2024      RADIOGRAPHY: CT ABDOMEN PELVIS W CONTRAST Result Date: 02/24/2024 CLINICAL DATA:  Ovarian cancer, monitor.  * Tracking Code: BO * EXAM: CT ABDOMEN AND PELVIS WITH CONTRAST TECHNIQUE: Multidetector CT imaging of the abdomen and pelvis was performed using the standard protocol following bolus administration of intravenous contrast. RADIATION DOSE REDUCTION: This exam was performed according to the departmental dose-optimization program which includes automated exposure control, adjustment of the mA and/or kV according to patient size and/or use of iterative reconstruction technique. CONTRAST:  OMNIPAQUE  IOHEXOL  300 MG/ML  SOLN COMPARISON:  Multiple priors including CT August 20, 2023 FINDINGS: Lower chest: No acute abnormality. Hepatobiliary: Decreased size of the dominant sub/pericapsular nodules along the hepatic capsule now measuring 18 x 13 mm on image 24/301 previously 3.1 x 2.6 cm. Increased size of a nodule along the inferior margin of the right lobe of the liver now measuring 11 mm on image 30/301 previously 9 mm. Increased size of a nodule along the posterior right lobe of the liver measuring 15 x 13 mm on image 25/301 previously 14 x 10 mm. Gallbladder is unremarkable.  No biliary ductal dilation. Pancreas: No pancreatic ductal dilation or evidence of acute inflammation. Spleen: No splenomegaly. Capsular implant along the inferior margin of the spleen measures 15 mm on image 24/301, unchanged. Adrenals/Urinary Tract: Left adrenal nodule, previously characterized as a benign adenoma on MRI Sep 02, 2018, measures 17 x 16 mm on image 23/301 previously 2.0 x 1.6 cm. Right adrenal gland appears normal. No hydronephrosis. Kidneys demonstrate symmetric enhancement. Left lower pole renal cyst. Urinary bladder is unremarkable for degree of distension. Stomach/Bowel: Stomach is nondistended. Radiopaque  enteric contrast material traverses the sigmoid colon. No pathologic dilation of large or small bowel. Colonic diverticulosis. Noninflamed appendix. Vascular/Lymphatic: Aortic atherosclerosis. Normal caliber abdominal aorta. Portal, splenic and superior mesenteric veins are patent. Prior left iliac side chain and retroperitoneal lymph node dissection. Increased size of the left external iliac mixed cystic and solid lesion now measuring 4.4 x 3.3 cm on image 64/301 previously 2.2 x 1.9 cm. Reproductive: Uterus is surgically absent. Other: Postsurgical change in the anterior abdominal wall. Ventral hernia contains fat and nonobstructed portion of bowel. No significant abdominopelvic free fluid. Musculoskeletal: No aggressive lytic or blastic lesion of bone. Thoracolumbar spondylosis. Right total hip arthroplasty. Degenerative change of the left hip. IMPRESSION: 1. Increased size of the left external iliac mixed cystic and solid lesion now measuring 4.4 x 3.3 cm previously 2.2 x 1.9 cm. 2. Mixed response of the peritoneal/capsular implants with decreased size of the dominant sub/pericapsular nodules along the hepatic capsule but increased size of a few additional peritoneal implants and a stable splenic capsule implant. No significant abdominal free fluid. 3. Stable left adrenal nodule, previously characterized as a benign adenoma on MRI Sep 02, 2018. 4. Ventral hernia contains fat and nonobstructed portion of bowel. 5. Aortic atherosclerosis. Aortic Atherosclerosis (ICD10-I70.0). Electronically Signed   By: Reyes Holder M.D.   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  We discussed the natural history of *** and general treatment, highlighting the role of radiotherapy in the management.  We discussed the available radiation techniques, and focused on the details of logistics and delivery.  We reviewed the anticipated acute and late sequelae associated with radiation in this setting.  The patient was encouraged to ask questions that I answered to the best of my ability. *** A patient consent form was discussed and signed.  We retained a copy for our records.  The patient would like to proceed with radiation and will be scheduled for CT simulation.  PLAN: ***    *** minutes of total time was spent for this patient encounter, including preparation, face-to-face counseling with the patient and coordination of care, physical exam, and documentation of the encounter.   ------------------------------------------------  Lynwood CHARM Nasuti, PhD, MD  This document serves as a record of services personally performed by Lynwood Nasuti, MD. It was created on his behalf by Dorthy Fuse, a trained medical scribe. The creation of this record is based on the scribe's personal observations and the provider's statements to them. This document has been checked and approved by the attending provider.

## 2024-03-08 ENCOUNTER — Ambulatory Visit
Admission: RE | Admit: 2024-03-08 | Discharge: 2024-03-08 | Disposition: A | Discharge status: Home / Self Care | Source: Ambulatory Visit | Attending: Radiation Oncology | Admitting: Radiation Oncology

## 2024-03-08 ENCOUNTER — Other Ambulatory Visit: Payer: Self-pay | Admitting: Radiology

## 2024-03-08 ENCOUNTER — Encounter (HOSPITAL_COMMUNITY)

## 2024-03-08 ENCOUNTER — Encounter: Payer: Self-pay | Admitting: Radiation Oncology

## 2024-03-08 VITALS — BP 143/76 | HR 63 | Temp 96.6°F | Resp 18 | Ht 66.0 in | Wt 190.4 lb

## 2024-03-08 DIAGNOSIS — K760 Fatty (change of) liver, not elsewhere classified: Secondary | ICD-10-CM | POA: Insufficient documentation

## 2024-03-08 DIAGNOSIS — I700 Atherosclerosis of aorta: Secondary | ICD-10-CM | POA: Insufficient documentation

## 2024-03-08 DIAGNOSIS — Z90722 Acquired absence of ovaries, bilateral: Secondary | ICD-10-CM | POA: Insufficient documentation

## 2024-03-08 DIAGNOSIS — Z79818 Long term (current) use of other agents affecting estrogen receptors and estrogen levels: Secondary | ICD-10-CM | POA: Insufficient documentation

## 2024-03-08 DIAGNOSIS — N183 Chronic kidney disease, stage 3 unspecified: Secondary | ICD-10-CM | POA: Diagnosis not present

## 2024-03-08 DIAGNOSIS — Z79811 Long term (current) use of aromatase inhibitors: Secondary | ICD-10-CM | POA: Insufficient documentation

## 2024-03-08 DIAGNOSIS — K449 Diaphragmatic hernia without obstruction or gangrene: Secondary | ICD-10-CM | POA: Diagnosis not present

## 2024-03-08 DIAGNOSIS — K5730 Diverticulosis of large intestine without perforation or abscess without bleeding: Secondary | ICD-10-CM | POA: Diagnosis not present

## 2024-03-08 DIAGNOSIS — E785 Hyperlipidemia, unspecified: Secondary | ICD-10-CM | POA: Insufficient documentation

## 2024-03-08 DIAGNOSIS — Z7984 Long term (current) use of oral hypoglycemic drugs: Secondary | ICD-10-CM | POA: Insufficient documentation

## 2024-03-08 DIAGNOSIS — Z79899 Other long term (current) drug therapy: Secondary | ICD-10-CM | POA: Diagnosis not present

## 2024-03-08 DIAGNOSIS — Z90710 Acquired absence of both cervix and uterus: Secondary | ICD-10-CM | POA: Insufficient documentation

## 2024-03-08 DIAGNOSIS — C562 Malignant neoplasm of left ovary: Secondary | ICD-10-CM | POA: Diagnosis not present

## 2024-03-08 DIAGNOSIS — G4730 Sleep apnea, unspecified: Secondary | ICD-10-CM | POA: Insufficient documentation

## 2024-03-08 DIAGNOSIS — C786 Secondary malignant neoplasm of retroperitoneum and peritoneum: Secondary | ICD-10-CM | POA: Insufficient documentation

## 2024-03-08 DIAGNOSIS — D121 Benign neoplasm of appendix: Secondary | ICD-10-CM | POA: Insufficient documentation

## 2024-03-08 DIAGNOSIS — K439 Ventral hernia without obstruction or gangrene: Secondary | ICD-10-CM | POA: Insufficient documentation

## 2024-03-08 DIAGNOSIS — Z9221 Personal history of antineoplastic chemotherapy: Secondary | ICD-10-CM | POA: Insufficient documentation

## 2024-03-08 DIAGNOSIS — Z803 Family history of malignant neoplasm of breast: Secondary | ICD-10-CM | POA: Insufficient documentation

## 2024-03-08 DIAGNOSIS — C775 Secondary and unspecified malignant neoplasm of intrapelvic lymph nodes: Secondary | ICD-10-CM | POA: Insufficient documentation

## 2024-03-08 DIAGNOSIS — N281 Cyst of kidney, acquired: Secondary | ICD-10-CM | POA: Diagnosis not present

## 2024-03-08 DIAGNOSIS — Z800 Family history of malignant neoplasm of digestive organs: Secondary | ICD-10-CM | POA: Insufficient documentation

## 2024-03-08 DIAGNOSIS — M47815 Spondylosis without myelopathy or radiculopathy, thoracolumbar region: Secondary | ICD-10-CM | POA: Diagnosis not present

## 2024-03-08 DIAGNOSIS — Z8616 Personal history of COVID-19: Secondary | ICD-10-CM | POA: Insufficient documentation

## 2024-03-08 DIAGNOSIS — K219 Gastro-esophageal reflux disease without esophagitis: Secondary | ICD-10-CM | POA: Insufficient documentation

## 2024-03-08 DIAGNOSIS — D279 Benign neoplasm of unspecified ovary: Secondary | ICD-10-CM | POA: Diagnosis not present

## 2024-03-08 DIAGNOSIS — C787 Secondary malignant neoplasm of liver and intrahepatic bile duct: Secondary | ICD-10-CM | POA: Diagnosis not present

## 2024-03-08 DIAGNOSIS — Z7982 Long term (current) use of aspirin: Secondary | ICD-10-CM | POA: Diagnosis not present

## 2024-03-09 ENCOUNTER — Encounter

## 2024-03-10 ENCOUNTER — Other Ambulatory Visit (HOSPITAL_COMMUNITY): Payer: Self-pay

## 2024-03-16 ENCOUNTER — Ambulatory Visit
Admission: RE | Admit: 2024-03-16 | Discharge: 2024-03-16 | Disposition: A | Discharge status: Home / Self Care | Source: Ambulatory Visit | Attending: Radiation Oncology | Admitting: Radiation Oncology

## 2024-03-16 ENCOUNTER — Telehealth: Payer: Self-pay

## 2024-03-16 DIAGNOSIS — C562 Malignant neoplasm of left ovary: Secondary | ICD-10-CM | POA: Diagnosis not present

## 2024-03-16 NOTE — Telephone Encounter (Signed)
 Called and scheduled appts. She is aware of appts.

## 2024-03-16 NOTE — Telephone Encounter (Signed)
-----   Message from [name] sent at 03/16/2024  7:53 AM EST ----- Can you schedule labs, see me at 2 pm and inj on 12/5? Use the NP slot for 20 mins Thanks

## 2024-03-17 DIAGNOSIS — C562 Malignant neoplasm of left ovary: Secondary | ICD-10-CM | POA: Diagnosis not present

## 2024-03-25 ENCOUNTER — Ambulatory Visit
Admission: RE | Admit: 2024-03-25 | Discharge: 2024-03-25 | Attending: Radiation Oncology | Admitting: Radiation Oncology

## 2024-03-25 ENCOUNTER — Other Ambulatory Visit: Payer: Self-pay

## 2024-03-25 DIAGNOSIS — C562 Malignant neoplasm of left ovary: Secondary | ICD-10-CM | POA: Diagnosis not present

## 2024-03-25 DIAGNOSIS — C569 Malignant neoplasm of unspecified ovary: Secondary | ICD-10-CM | POA: Diagnosis present

## 2024-03-25 DIAGNOSIS — D630 Anemia in neoplastic disease: Secondary | ICD-10-CM | POA: Diagnosis not present

## 2024-03-25 DIAGNOSIS — Z510 Encounter for antineoplastic radiation therapy: Secondary | ICD-10-CM | POA: Diagnosis not present

## 2024-03-25 DIAGNOSIS — M8580 Other specified disorders of bone density and structure, unspecified site: Secondary | ICD-10-CM | POA: Diagnosis not present

## 2024-03-25 LAB — RAD ONC ARIA SESSION SUMMARY
Course Elapsed Days: 0
Plan Fractions Treated to Date: 1
Plan Prescribed Dose Per Fraction: 3 Gy
Plan Total Fractions Prescribed: 10
Plan Total Prescribed Dose: 30 Gy
Reference Point Dosage Given to Date: 3 Gy
Reference Point Session Dosage Given: 3 Gy
Session Number: 1

## 2024-03-26 ENCOUNTER — Telehealth: Payer: Self-pay

## 2024-03-26 ENCOUNTER — Ambulatory Visit
Admission: RE | Admit: 2024-03-26 | Discharge: 2024-03-26 | Attending: Radiation Oncology | Admitting: Radiation Oncology

## 2024-03-26 ENCOUNTER — Other Ambulatory Visit: Payer: Self-pay

## 2024-03-26 ENCOUNTER — Inpatient Hospital Stay: Attending: Hematology

## 2024-03-26 ENCOUNTER — Inpatient Hospital Stay: Attending: Hematology | Admitting: Hematology and Oncology

## 2024-03-26 ENCOUNTER — Encounter: Payer: Self-pay | Admitting: Hematology and Oncology

## 2024-03-26 ENCOUNTER — Inpatient Hospital Stay

## 2024-03-26 VITALS — BP 139/78 | HR 57 | Temp 97.7°F | Resp 18 | Ht 66.0 in | Wt 194.2 lb

## 2024-03-26 DIAGNOSIS — C569 Malignant neoplasm of unspecified ovary: Secondary | ICD-10-CM | POA: Insufficient documentation

## 2024-03-26 DIAGNOSIS — C562 Malignant neoplasm of left ovary: Secondary | ICD-10-CM | POA: Diagnosis not present

## 2024-03-26 DIAGNOSIS — M8580 Other specified disorders of bone density and structure, unspecified site: Secondary | ICD-10-CM | POA: Insufficient documentation

## 2024-03-26 DIAGNOSIS — D630 Anemia in neoplastic disease: Secondary | ICD-10-CM | POA: Insufficient documentation

## 2024-03-26 DIAGNOSIS — Z510 Encounter for antineoplastic radiation therapy: Secondary | ICD-10-CM | POA: Diagnosis not present

## 2024-03-26 LAB — COMPREHENSIVE METABOLIC PANEL WITH GFR
ALT: 14 U/L (ref 0–44)
AST: 20 U/L (ref 15–41)
Albumin: 4.2 g/dL (ref 3.5–5.0)
Alkaline Phosphatase: 98 U/L (ref 38–126)
Anion gap: 10 (ref 5–15)
BUN: 24 mg/dL — ABNORMAL HIGH (ref 8–23)
CO2: 25 mmol/L (ref 22–32)
Calcium: 9.2 mg/dL (ref 8.9–10.3)
Chloride: 102 mmol/L (ref 98–111)
Creatinine, Ser: 1.05 mg/dL — ABNORMAL HIGH (ref 0.44–1.00)
GFR, Estimated: 58 mL/min — ABNORMAL LOW (ref >60)
Glucose, Bld: 117 mg/dL — ABNORMAL HIGH (ref 70–99)
Potassium: 4.3 mmol/L (ref 3.5–5.1)
Sodium: 137 mmol/L (ref 135–145)
Total Bilirubin: 0.2 mg/dL (ref 0.0–1.2)
Total Protein: 6.7 g/dL (ref 6.5–8.1)

## 2024-03-26 LAB — CBC WITH DIFFERENTIAL/PLATELET
Abs Immature Granulocytes: 0.01 K/uL (ref 0.00–0.07)
Basophils Absolute: 0.1 K/uL (ref 0.0–0.1)
Basophils Relative: 1 %
Eosinophils Absolute: 0.3 K/uL (ref 0.0–0.5)
Eosinophils Relative: 5 %
HCT: 33.9 % — ABNORMAL LOW (ref 36.0–46.0)
Hemoglobin: 11.3 g/dL — ABNORMAL LOW (ref 12.0–15.0)
Immature Granulocytes: 0 %
Lymphocytes Relative: 32 %
Lymphs Abs: 1.9 K/uL (ref 0.7–4.0)
MCH: 27.7 pg (ref 26.0–34.0)
MCHC: 33.3 g/dL (ref 30.0–36.0)
MCV: 83.1 fL (ref 80.0–100.0)
Monocytes Absolute: 0.5 K/uL (ref 0.1–1.0)
Monocytes Relative: 9 %
Neutro Abs: 3.1 K/uL (ref 1.7–7.7)
Neutrophils Relative %: 53 %
Platelets: 400 K/uL (ref 150–400)
RBC: 4.08 MIL/uL (ref 3.87–5.11)
RDW: 13 % (ref 11.5–15.5)
WBC: 5.8 K/uL (ref 4.0–10.5)
nRBC: 0 % (ref 0.0–0.2)

## 2024-03-26 LAB — RAD ONC ARIA SESSION SUMMARY
Course Elapsed Days: 1
Plan Fractions Treated to Date: 2
Plan Prescribed Dose Per Fraction: 3 Gy
Plan Total Fractions Prescribed: 10
Plan Total Prescribed Dose: 30 Gy
Reference Point Dosage Given to Date: 6 Gy
Reference Point Session Dosage Given: 3 Gy
Session Number: 2

## 2024-03-26 MED ORDER — LEUPROLIDE ACETATE 3.75 MG IM KIT
3.7500 mg | PACK | Freq: Once | INTRAMUSCULAR | Status: AC
  Administered 2024-03-26: 3.75 mg via INTRAMUSCULAR
  Filled 2024-03-26: qty 3.75

## 2024-03-26 NOTE — Assessment & Plan Note (Addendum)
 She has mild anemia Likely due to recent hip surgery Will proceed with treatment without delay

## 2024-03-26 NOTE — Progress Notes (Signed)
 [facility] OFFICE PROGRESS NOTE  Patient Care Team: Dwight Trula SQUIBB, MD as PCP - General (Internal Medicine) Thukkani, Arun K, MD as PCP - Cardiology (Cardiology)  Assessment & Plan Malignant granulosa cell tumor of ovary, unspecified laterality [facility]) The patient was originally diagnosed with granulosa cell tumor of the ovary in 1994 status post surgery.  She had relapse in 2002, resected followed by 6 cycles of cisplatin and etoposide and then have disease relapse again in 2010.  She has at least 4 abdominal surgeries over the span of 30 years Pathology: Granulosa cell tumor of the ovary, 2018 - Core biopsy for ER/PR and Foundation One testing performed. Unfortunately, no sufficient tissue for Foundation One testing. ER was 50% and PR 90%. Between 2010-2025, Progressed on carboplatin , exemestane , Avastin ,Tamoxifen /Megace  and letrozole , mixed response on Lupron   Her treatment course is stabilized currently on combination of Lupron  monthly and daily anastrozole  I reviewed CT imaging from October 2025 which showed majority of the lesions are stable/improved but the left pelvic lymph node is considerably enlarged  So far, she tolerated treatment well without major side effects She is reluctant to pursue chemotherapy I recommend consideration for radiation oncology consultation to see if this necrotic looking lymph node can be irradiated Due to extensive surgeries in the past, I do not think she would be a good surgical candidate at this point She will continue Lupron  monthly and daily anastrozole  for now She will complete radiation therapy by the end of the month  I also reviewed results of her bone mineral density scan which show borderline osteopenia She will continue calcium  with vitamin D  supplement only for now I plan to repeat imaging study in March 2026 In the meantime, she will continue treatment as scheduled today Anemia in neoplastic disease She has mild anemia Likely  due to recent hip surgery Will proceed with treatment without delay  Orders Placed This Encounter  Procedures   CBC with Differential/Platelet    Standing Status:   Standing    Number of Occurrences:   22    Expiration Date:   03/26/2025     Almarie Bedford, MD  INTERVAL HISTORY: she returns for treatment follow-up on Lupron  injection She is undergoing radiation therapy She tolerated treatment well without major side effects  PHYSICAL EXAMINATION: ECOG PERFORMANCE STATUS: 1 - Symptomatic but completely ambulatory  No results found for: CAN125    Latest Ref Rng & Units 03/26/2024    2:04 PM 02/27/2024   10:14 AM 01/28/2024    8:36 AM  CBC  WBC 4.0 - 10.5 K/uL 5.8  5.5  5.6   Hemoglobin 12.0 - 15.0 g/dL 88.6  87.7  86.4   Hematocrit 36.0 - 46.0 % 33.9  37.9  39.7   Platelets 150 - 400 K/uL 400  525  350       Chemistry      Component Value Date/Time   NA 138 02/27/2024 1014   NA 140 07/09/2021 0813   NA 141 09/24/2013 1131   K 4.2 02/27/2024 1014   K 4.5 09/24/2013 1131   CL 102 02/27/2024 1014   CO2 28 02/27/2024 1014   CO2 24 09/24/2013 1131   BUN 22 02/27/2024 1014   BUN 20 07/09/2021 0813   BUN 21.2 09/24/2013 1131   CREATININE 1.24 (H) 02/27/2024 1014   CREATININE 1.15 (H) 08/28/2023 0904   CREATININE 1.1 09/24/2013 1131      Component Value Date/Time   CALCIUM  9.4 02/27/2024 1014  CALCIUM  9.5 09/24/2013 1131   ALKPHOS 101 02/27/2024 1014   ALKPHOS 81 09/24/2013 1131   AST 15 02/27/2024 1014   AST 19 08/28/2023 0904   AST 23 09/24/2013 1131   ALT 9 02/27/2024 1014   ALT 16 08/28/2023 0904   ALT 27 09/24/2013 1131   BILITOT 0.5 02/27/2024 1014   BILITOT 0.4 08/28/2023 0904   BILITOT 0.53 09/24/2013 1131       Vitals:   03/26/24 1359  BP: 139/78  Pulse: (!) 57  Resp: 18  Temp: 97.7 F (36.5 C)  SpO2: 100%   Filed Weights   03/26/24 1359  Weight: 194 lb 3.2 oz (88.1 kg)   Other relevant data reviewed during this visit included CBC and CMP

## 2024-03-26 NOTE — Assessment & Plan Note (Addendum)
 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  She had relapse in 2002, resected followed by 6 cycles of cisplatin and etoposide and then have disease relapse again in 2010.  She has at least 4 abdominal surgeries over the span of 30 years Pathology: Granulosa cell tumor of the ovary, 2018 - Core biopsy for ER/PR and Foundation One testing performed. Unfortunately, no sufficient tissue for Foundation One testing. ER was 50% and PR 90%. Between 2010-2025, Progressed on carboplatin , exemestane , Avastin ,Tamoxifen /Megace  and letrozole , mixed response on Lupron   Her treatment course is stabilized currently on combination of Lupron  monthly and daily anastrozole  I reviewed CT imaging from October 2025 which showed majority of the lesions are stable/improved but the left pelvic lymph node is considerably enlarged  So far, she tolerated treatment well without major side effects She is reluctant to pursue chemotherapy I recommend consideration for radiation oncology consultation to see if this necrotic looking lymph node can be irradiated Due to extensive surgeries in the past, I do not think she would be a good surgical candidate at this point She will continue Lupron  monthly and daily anastrozole  for now She will complete radiation therapy by the end of the month  I also reviewed results of her bone mineral density scan which show borderline osteopenia She will continue calcium  with vitamin D  supplement only for now I plan to repeat imaging study in March 2026 In the meantime, she will continue treatment as scheduled today

## 2024-03-26 NOTE — Telephone Encounter (Signed)
 Called and left a message per Dr. Lonn, let her know her anemia is likely due to recent hip surgery.  Ask her to call the office for questions.

## 2024-03-27 ENCOUNTER — Other Ambulatory Visit (HOSPITAL_COMMUNITY): Payer: Self-pay

## 2024-03-27 MED ORDER — AMOXICILLIN 500 MG PO CAPS
ORAL_CAPSULE | ORAL | 0 refills | Status: AC
  Filled 2024-03-27: qty 20, 5d supply, fill #0

## 2024-03-28 DIAGNOSIS — Z724 Inappropriate diet and eating habits: Secondary | ICD-10-CM | POA: Diagnosis not present

## 2024-03-28 DIAGNOSIS — E669 Obesity, unspecified: Secondary | ICD-10-CM | POA: Diagnosis not present

## 2024-03-28 DIAGNOSIS — Z713 Dietary counseling and surveillance: Secondary | ICD-10-CM | POA: Diagnosis not present

## 2024-03-29 ENCOUNTER — Ambulatory Visit
Admission: RE | Admit: 2024-03-29 | Discharge: 2024-03-29 | Attending: Radiation Oncology | Admitting: Radiation Oncology

## 2024-03-29 ENCOUNTER — Other Ambulatory Visit: Payer: Self-pay

## 2024-03-29 DIAGNOSIS — Z510 Encounter for antineoplastic radiation therapy: Secondary | ICD-10-CM | POA: Diagnosis not present

## 2024-03-29 DIAGNOSIS — C562 Malignant neoplasm of left ovary: Secondary | ICD-10-CM | POA: Diagnosis not present

## 2024-03-29 DIAGNOSIS — C569 Malignant neoplasm of unspecified ovary: Secondary | ICD-10-CM | POA: Diagnosis not present

## 2024-03-29 LAB — RAD ONC ARIA SESSION SUMMARY
Course Elapsed Days: 4
Plan Fractions Treated to Date: 3
Plan Prescribed Dose Per Fraction: 3 Gy
Plan Total Fractions Prescribed: 10
Plan Total Prescribed Dose: 30 Gy
Reference Point Dosage Given to Date: 9 Gy
Reference Point Session Dosage Given: 3 Gy
Session Number: 3

## 2024-03-30 ENCOUNTER — Other Ambulatory Visit: Payer: Self-pay

## 2024-03-30 ENCOUNTER — Ambulatory Visit
Admission: RE | Admit: 2024-03-30 | Discharge: 2024-03-30 | Attending: Radiation Oncology | Admitting: Radiation Oncology

## 2024-03-30 DIAGNOSIS — C562 Malignant neoplasm of left ovary: Secondary | ICD-10-CM | POA: Diagnosis not present

## 2024-03-30 DIAGNOSIS — C569 Malignant neoplasm of unspecified ovary: Secondary | ICD-10-CM | POA: Diagnosis not present

## 2024-03-30 DIAGNOSIS — Z510 Encounter for antineoplastic radiation therapy: Secondary | ICD-10-CM | POA: Diagnosis not present

## 2024-03-30 LAB — RAD ONC ARIA SESSION SUMMARY
Course Elapsed Days: 5
Plan Fractions Treated to Date: 4
Plan Prescribed Dose Per Fraction: 3 Gy
Plan Total Fractions Prescribed: 10
Plan Total Prescribed Dose: 30 Gy
Reference Point Dosage Given to Date: 12 Gy
Reference Point Session Dosage Given: 3 Gy
Session Number: 4

## 2024-03-31 ENCOUNTER — Ambulatory Visit
Admission: RE | Admit: 2024-03-31 | Discharge: 2024-03-31 | Attending: Radiation Oncology | Admitting: Radiation Oncology

## 2024-03-31 ENCOUNTER — Other Ambulatory Visit: Payer: Self-pay

## 2024-03-31 DIAGNOSIS — C569 Malignant neoplasm of unspecified ovary: Secondary | ICD-10-CM | POA: Diagnosis not present

## 2024-03-31 DIAGNOSIS — C562 Malignant neoplasm of left ovary: Secondary | ICD-10-CM | POA: Diagnosis not present

## 2024-03-31 DIAGNOSIS — Z510 Encounter for antineoplastic radiation therapy: Secondary | ICD-10-CM | POA: Diagnosis not present

## 2024-03-31 LAB — RAD ONC ARIA SESSION SUMMARY
Course Elapsed Days: 6
Plan Fractions Treated to Date: 5
Plan Prescribed Dose Per Fraction: 3 Gy
Plan Total Fractions Prescribed: 10
Plan Total Prescribed Dose: 30 Gy
Reference Point Dosage Given to Date: 15 Gy
Reference Point Session Dosage Given: 3 Gy
Session Number: 5

## 2024-04-01 ENCOUNTER — Ambulatory Visit
Admission: RE | Admit: 2024-04-01 | Discharge: 2024-04-01 | Attending: Radiation Oncology | Admitting: Radiation Oncology

## 2024-04-01 ENCOUNTER — Other Ambulatory Visit: Payer: Self-pay

## 2024-04-01 DIAGNOSIS — C562 Malignant neoplasm of left ovary: Secondary | ICD-10-CM | POA: Diagnosis not present

## 2024-04-01 DIAGNOSIS — Z510 Encounter for antineoplastic radiation therapy: Secondary | ICD-10-CM | POA: Diagnosis not present

## 2024-04-01 DIAGNOSIS — C569 Malignant neoplasm of unspecified ovary: Secondary | ICD-10-CM | POA: Diagnosis not present

## 2024-04-01 LAB — RAD ONC ARIA SESSION SUMMARY
Course Elapsed Days: 7
Plan Fractions Treated to Date: 6
Plan Prescribed Dose Per Fraction: 3 Gy
Plan Total Fractions Prescribed: 10
Plan Total Prescribed Dose: 30 Gy
Reference Point Dosage Given to Date: 18 Gy
Reference Point Session Dosage Given: 3 Gy
Session Number: 6

## 2024-04-02 ENCOUNTER — Ambulatory Visit

## 2024-04-05 ENCOUNTER — Other Ambulatory Visit: Payer: Self-pay

## 2024-04-05 ENCOUNTER — Ambulatory Visit
Admission: RE | Admit: 2024-04-05 | Discharge: 2024-04-05 | Attending: Radiation Oncology | Admitting: Radiation Oncology

## 2024-04-05 DIAGNOSIS — C569 Malignant neoplasm of unspecified ovary: Secondary | ICD-10-CM | POA: Diagnosis not present

## 2024-04-05 DIAGNOSIS — Z510 Encounter for antineoplastic radiation therapy: Secondary | ICD-10-CM | POA: Diagnosis not present

## 2024-04-05 DIAGNOSIS — C562 Malignant neoplasm of left ovary: Secondary | ICD-10-CM | POA: Diagnosis not present

## 2024-04-05 LAB — RAD ONC ARIA SESSION SUMMARY
Course Elapsed Days: 11
Plan Fractions Treated to Date: 7
Plan Prescribed Dose Per Fraction: 3 Gy
Plan Total Fractions Prescribed: 10
Plan Total Prescribed Dose: 30 Gy
Reference Point Dosage Given to Date: 21 Gy
Reference Point Session Dosage Given: 3 Gy
Session Number: 7

## 2024-04-06 ENCOUNTER — Ambulatory Visit
Admission: RE | Admit: 2024-04-06 | Discharge: 2024-04-06 | Attending: Radiation Oncology | Admitting: Radiation Oncology

## 2024-04-06 ENCOUNTER — Other Ambulatory Visit: Payer: Self-pay

## 2024-04-06 DIAGNOSIS — C569 Malignant neoplasm of unspecified ovary: Secondary | ICD-10-CM | POA: Diagnosis not present

## 2024-04-06 DIAGNOSIS — Z510 Encounter for antineoplastic radiation therapy: Secondary | ICD-10-CM | POA: Diagnosis not present

## 2024-04-06 DIAGNOSIS — C562 Malignant neoplasm of left ovary: Secondary | ICD-10-CM | POA: Diagnosis not present

## 2024-04-06 LAB — RAD ONC ARIA SESSION SUMMARY
Course Elapsed Days: 12
Plan Fractions Treated to Date: 8
Plan Prescribed Dose Per Fraction: 3 Gy
Plan Total Fractions Prescribed: 10
Plan Total Prescribed Dose: 30 Gy
Reference Point Dosage Given to Date: 24 Gy
Reference Point Session Dosage Given: 3 Gy
Session Number: 8

## 2024-04-07 ENCOUNTER — Other Ambulatory Visit: Payer: Self-pay

## 2024-04-07 ENCOUNTER — Ambulatory Visit

## 2024-04-07 ENCOUNTER — Ambulatory Visit: Admitting: Pulmonary Disease

## 2024-04-07 ENCOUNTER — Ambulatory Visit
Admission: RE | Admit: 2024-04-07 | Discharge: 2024-04-07 | Attending: Radiation Oncology | Admitting: Radiation Oncology

## 2024-04-07 ENCOUNTER — Encounter: Payer: Self-pay | Admitting: Pulmonary Disease

## 2024-04-07 VITALS — BP 134/76 | HR 55 | Temp 97.3°F | Ht 66.0 in | Wt 193.2 lb

## 2024-04-07 DIAGNOSIS — C569 Malignant neoplasm of unspecified ovary: Secondary | ICD-10-CM | POA: Diagnosis not present

## 2024-04-07 DIAGNOSIS — G4733 Obstructive sleep apnea (adult) (pediatric): Secondary | ICD-10-CM

## 2024-04-07 DIAGNOSIS — C562 Malignant neoplasm of left ovary: Secondary | ICD-10-CM | POA: Diagnosis not present

## 2024-04-07 DIAGNOSIS — Z510 Encounter for antineoplastic radiation therapy: Secondary | ICD-10-CM | POA: Diagnosis not present

## 2024-04-07 LAB — RAD ONC ARIA SESSION SUMMARY
Course Elapsed Days: 13
Plan Fractions Treated to Date: 9
Plan Prescribed Dose Per Fraction: 3 Gy
Plan Total Fractions Prescribed: 10
Plan Total Prescribed Dose: 30 Gy
Reference Point Dosage Given to Date: 27 Gy
Reference Point Session Dosage Given: 3 Gy
Session Number: 9

## 2024-04-07 NOTE — Patient Instructions (Signed)
 DME referral for CPAP supplies-[facility] -On auto CPAP 5-20  Tentative follow-up in about 3 months  Once you get your supplies, continue using CPAP and on a regular basis  Graded activities as tolerated

## 2024-04-07 NOTE — Addendum Note (Signed)
 Addended by: Dontrel Smethers M on: 04/07/2024 09:06 AM   Modules accepted: Orders

## 2024-04-07 NOTE — Progress Notes (Signed)
 [name]    [id number]    02-Oct-1955  Primary Care Physician:Raju, Trula SQUIBB, MD  Referring Physician: Dwight Trula SQUIBB, MD [address] [place],  [place] [address]  Chief complaint:   Patient in for follow-up for obstructive sleep apnea Diagnosed in 2019 Has been using CPAP on a regular basis Continues to benefit  Discussed the use of AI scribe software for clinical note transcription with the patient, who gave verbal consent to proceed.  History of Present Illness [name] is a 68 year old female with sleep apnea who presents for evaluation of CPAP usage and supply issues.  Has not been able to use her CPAP in the last couple of months as she does not have supplies She was using it on a regular basis, tolerating it well, benefiting from CPAP  She last used her CPAP machine approximately two months ago due to her medical supply company requiring a reevaluation before refilling her supplies. Her CPAP machine is about 67 years old, and she has not experienced any problems with it. When using the CPAP regularly, she felt better in the mornings and did not experience headaches or night sweats.  Her sleep is generally good, with a bedtime around 9:30 to 10:30 PM and waking up in the morning. Her weight has remained stable over the past four to five years. She was attending the [facility] and plans to resume regular exercise after recovering from a recent hip replacement.  Feels she has continued to benefit from using CPAP  She is currently undergoing radiation therapy for a lesion and receiving Lupron  injections as part of her cancer treatment. She has been dealing with this condition for a long time and feels fortunate despite the lesion still growing.  No headaches or night sweats in the mornings.  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   amoxicillin  (AMOXIL ) 500 MG capsule Take 4 capsules 1 hours prior to dental procedure.   anastrozole  (ARIMIDEX ) 1 MG tablet Take 1 tablet (1 mg total) by mouth daily.   aspirin  EC 81 MG tablet Take 81 mg by mouth daily. Swallow whole.   Biotin 5 MG TABS Take 5 mg by mouth daily.   buPROPion  (WELLBUTRIN  SR) 200 MG 12 hr tablet Take 1 tablet (200 mg total) by mouth 2 (two) times daily.   docusate sodium  (COLACE) 100 MG capsule Take 1 capsule (100 mg total) by mouth daily.   escitalopram  (LEXAPRO ) 20 MG tablet Take 1 tablet (20 mg total) by mouth daily.   gabapentin  (NEURONTIN ) 300 MG capsule Take 1 capsule (300 mg total) by mouth 2 (two) times daily. (Patient taking differently: Take 300 mg by mouth as needed (Per pt).)   leuprolide  (LUPRON ) 30 MG injection Inject 30 mg into the muscle every 30 (thirty) days.   metFORMIN  (GLUCOPHAGE ) 500 MG tablet Take 2 tablets (1,000 mg total) by mouth daily with supper.   mirabegron  ER (MYRBETRIQ ) 50 MG TB24 tablet Take 1 tablet (50  mg total) by mouth daily.   omeprazole  (PRILOSEC) 20 MG capsule Take 1 capsule (20 mg total) by mouth daily.   Polyethyl Glycol-Propyl Glycol (SYSTANE OP) Place 1 drop into both eyes daily as needed (dry eyes).   polyethylene glycol (MIRALAX / GLYCOLAX) 17 g packet Take 17 g by mouth daily.   Probiotic Product (ALIGN PO) Take 1 tablet by mouth daily.    tretinoin  (RETIN-A ) 0.025 % cream Apply 1 application to the face Nightly.   triamcinolone  cream (KENALOG ) 0.1 % Apply 1 small Application topically 2 (two) times daily to affected area.   Vitamin D ,  Ergocalciferol , (DRISDOL ) 1.25 MG (50000 UNIT) CAPS capsule Take 1 capsule by mouth every 10 days.   [DISCONTINUED] aspirin  325 MG tablet Take 1 tablet (325 mg total) by mouth daily for 1 month.   [DISCONTINUED] isosorbide  mononitrate (IMDUR ) 30 MG 24 hr tablet Take 1 tablet (30 mg total) by mouth daily.   [DISCONTINUED] ondansetron  (ZOFRAN ) 8 MG tablet Take 1 tablet (8 mg total) by mouth every 8 (eight) hours as needed for nausea or vomiting.   [DISCONTINUED] oxyCODONE  (OXY IR/ROXICODONE ) 5 MG immediate release tablet Take 1 tablet (5 mg total) by mouth every 4 (four) hours for pain.   [DISCONTINUED] tiZANidine  (ZANAFLEX ) 2 MG tablet Take 1 tablet (2 mg total) by mouth every 6 (six) hours.   Facility-Administered Encounter Medications as of 04/07/2024  Medication   leuprolide  (LUPRON ) injection 3.75 mg    Allergies as of 04/07/2024 - Review Complete 04/07/2024  Allergen Reaction Noted   Codeine Nausea And Vomiting 07/29/2017   Thimerosal (thiomersal) Other (See Comments) 06/08/2020   Ciprofloxacin Other (See Comments) 06/06/2010   Levofloxacin Other (See Comments) 03/03/2018   Oxaprozin Rash and Dermatitis 04/29/2018   Stadol [butorphanol tartrate] Other (See Comments) 06/06/2010   Sulfa antibiotics Rash and Dermatitis 09/30/2011    Past Medical History:  Diagnosis Date   Anxiety    Arthritis    Cervical syndrome    CKD (chronic kidney disease), stage III (HCC)    Constipation    COVID-19 03/26/2019   Family history of colon cancer 02/15/2020   Fatty liver    GERD (gastroesophageal reflux disease)    Granulosa cell tumor of ovary    History of hiatal hernia    Hyperlipidemia    Joint pain    Neck pain    Obesity    Osteoarthritis    Ovarian cancer (HCC)    PONV (postoperative nausea and vomiting)    history of n/v,  past surgeries no n/v   Sleep apnea    uses CPAP   Tremor     Past Surgical History:  Procedure Laterality Date   ABDOMINAL HYSTERECTOMY     TAH/BSO    APPENDECTOMY     EXPLORATORY LAPAROTOMY     for bowel obstruction   IR IMAGING GUIDED PORT INSERTION  10/25/2020   IR REMOVAL TUN ACCESS W/ PORT W/O FL MOD SED  06/19/2021   left toe surgery Left    Secondary tumor debulking  2002   SHOULDER SURGERY     2017 left   Tumor debulking  2010   VENTRAL HERNIA REPAIR      Family History  Problem Relation Age of Onset   Cancer Mother        basal cell   Basal cell carcinoma Mother        85s   Thyroid  disease Mother    Cancer Father 25 - 20  colon   Stroke Father    Colon cancer Father 11   Hypertension Father    Heart disease Father    Colon cancer Paternal Uncle        dx 71s   Colon cancer Cousin        maternal cousin; dx 80s   BRCA 1/2 Neg Hx    Breast cancer Neg Hx     Social History   Socioeconomic History   Marital status: Married    Spouse name: Yaris Ferrell   Number of children: 2   Years of education: college   Highest education level: Not on file  Occupational History   Occupation: [employer]  Tobacco Use   Smoking status: Never   Smokeless tobacco: Never  Vaping Use   Vaping status: Never Used  Substance and Sexual Activity   Alcohol use: Not Currently   Drug use: No   Sexual activity: Not Currently  Other Topics Concern   Not on file  Social History Narrative   Lives at home with husband.   Right-handed.   One cup caffeine daily.   Social Drivers of Health   Tobacco Use: Low Risk (04/07/2024)   Patient History    Smoking Tobacco Use: Never    Smokeless Tobacco Use: Never    Passive Exposure: Not on file  Financial Resource Strain: Not on file  Food Insecurity: Not on file  Transportation Needs: Not on file  Physical Activity: Not on file  Stress: Not on file  Social Connections: Not on file  Intimate Partner Violence: Not on file  Depression (EYV7-0): Not on file  Alcohol Screen: Not on file  Housing: Not on file  Utilities: Not on file  Health Literacy: Not on file    Review of  Systems  Respiratory:  Positive for apnea.   Psychiatric/Behavioral:  Positive for sleep disturbance.     Vitals:   04/07/24 0831  BP: 134/76  Pulse: (!) 55  Temp: (!) 97.3 F (36.3 C)  SpO2: 98%     Physical Exam Constitutional:      Appearance: She is obese.  HENT:     Head: Normocephalic.     Nose: Nose normal.     Mouth/Throat:     Mouth: Mucous membranes are moist.  Eyes:     General: No scleral icterus.    Pupils: Pupils are equal, round, and reactive to light.  Cardiovascular:     Rate and Rhythm: Normal rate and regular rhythm.     Heart sounds: No murmur heard.    No friction rub.  Pulmonary:     Effort: No respiratory distress.     Breath sounds: No stridor. No wheezing or rhonchi.  Musculoskeletal:     Cervical back: No rigidity or tenderness.  Skin:    General: Skin is warm.  Neurological:     General: No focal deficit present.     Mental Status: She is alert.  Psychiatric:        Mood and Affect: Mood normal.    Data Reviewed: Sleep study from 2019 reviewed showing severe obstructive sleep apnea with AHI of over 32  Has been using auto CPAP 5-20   Assessment and Plan Assessment & Plan Obstructive sleep apnea Previous CPAP use. Last CPAP use was two months ago due to supply issues. Previous sleep study showed apnea-hypopnea index of 32.6 events per hour. CPAP machine is approximately 67 years old and functioning well. No new symptoms such as morning headaches or  night sweats. Weight has remained stable over the past four years. Discussed insurance requirements for CPAP supplies and potential need for a new sleep study if insurance does not cover supplies due to lack of recent data. Explained that most people qualify for a new machine after five years, and if the current machine is functioning well, there is no need for an upgrade. - Ordered CPAP supplies and checked if insurance honors the order. - If insurance requires a new sleep study, will  order a home sleep test to confirm ongoing sleep apnea. - Scheduled follow-up in three months to assess CPAP supply status and machine functionality.  Granulosa cell tumor of the ovary - Following up with oncology - Radiation treatments, Lupron   CPAP continues to help Ran out of supplies  She was using CPAP and benefiting from CPAP, I think with obtaining supplies she should be able to get back to using CPAP and continue to benefit from its use  Encouraged to give us  a call with any other significant concerns   No orders of the defined types were placed in this encounter.     Jennet Epley MD [facility] 04/07/2024, 8:49 AM  CC: Dwight Trula SQUIBB, MD

## 2024-04-08 ENCOUNTER — Other Ambulatory Visit: Payer: Self-pay

## 2024-04-08 ENCOUNTER — Ambulatory Visit: Admission: RE | Admit: 2024-04-08

## 2024-04-08 ENCOUNTER — Encounter: Payer: Self-pay | Admitting: Internal Medicine

## 2024-04-08 DIAGNOSIS — M65311 Trigger thumb, right thumb: Secondary | ICD-10-CM | POA: Diagnosis not present

## 2024-04-08 DIAGNOSIS — C562 Malignant neoplasm of left ovary: Secondary | ICD-10-CM | POA: Diagnosis not present

## 2024-04-08 DIAGNOSIS — C569 Malignant neoplasm of unspecified ovary: Secondary | ICD-10-CM | POA: Diagnosis not present

## 2024-04-08 DIAGNOSIS — Z510 Encounter for antineoplastic radiation therapy: Secondary | ICD-10-CM | POA: Diagnosis not present

## 2024-04-08 LAB — RAD ONC ARIA SESSION SUMMARY
Course Elapsed Days: 14
Plan Fractions Treated to Date: 10
Plan Prescribed Dose Per Fraction: 3 Gy
Plan Total Fractions Prescribed: 10
Plan Total Prescribed Dose: 30 Gy
Reference Point Dosage Given to Date: 30 Gy
Reference Point Session Dosage Given: 3 Gy
Session Number: 10

## 2024-04-08 MED ORDER — EZETIMIBE 10 MG PO TABS: 10.0000 mg | ORAL_TABLET | Freq: Every day | ORAL | 0 refills | Status: AC

## 2024-04-09 NOTE — Radiation Completion Notes (Signed)
 Patient Name: ZAIDY, ABSHER MRN: [id number] Date of Birth: October 01, 1955 Referring Physician: ALMARIE BEDFORD, M.D. Date of Service: 2024-04-09 Radiation Oncologist: Signe Nasuti, M.D. 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 Staging on 2019-06-08: Malignant granulosa cell tumor of ovary (HCC) T=T2, N=N1, M=M0 Intent: Palliative     ==========DELIVERED PLANS==========  First Treatment Date: 2024-03-25 Last Treatment Date: 2024-04-08   Plan Name: Pelvis_LN Site: Internal Iliac Nodes Technique: IMRT Mode: Photon Dose Per Fraction: 3 Gy Prescribed Dose (Delivered / Prescribed): 30 Gy / 30 Gy Prescribed Fxs (Delivered / Prescribed): 10 / 10     ==========ON TREATMENT VISIT DATES========== 2024-03-30, 2024-04-06     ==========UPCOMING VISITS========== 05/20/2024 [facility] OFFICE VISIT Wendel Lurena POUR, MD  05/11/2024 [facility] ONC FOLLOW UP [address] Wy[address] Leeroy [place], [place]  05/06/2024 [facility] RETINA VISIT Valdemar Rogue, MD        ==========APPENDIX - ON TREATMENT VISIT NOTES==========   See weekly On Treatment Notes in Epic for details in the Media tab (listed as Progress notes on the On Treatment Visit Dates listed above).

## 2024-04-16 ENCOUNTER — Other Ambulatory Visit (HOSPITAL_BASED_OUTPATIENT_CLINIC_OR_DEPARTMENT_OTHER): Payer: Self-pay

## 2024-04-19 ENCOUNTER — Telehealth: Payer: Self-pay

## 2024-04-23 ENCOUNTER — Encounter: Payer: Self-pay | Admitting: Pulmonary Disease

## 2024-04-23 NOTE — Progress Notes (Signed)
 " [facility] - Clinic Note  05/06/2024   CHIEF COMPLAINT Patient presents for Retina Follow Up  HISTORY OF PRESENT ILLNESS: [name] is a 69 y.o. female who presents to the clinic today for:  HPI     Retina Follow Up   In right eye.  This started 9 months ago.  Duration of 9 months.  Since onset it is stable.  I, the attending physician,  performed the HPI with the patient and updated documentation appropriately.        Comments   9 month retina follow up ERM OD pt is reporting no vision changes noticed she denies any flashes has some floaters       Last edited by Valdemar Rogue, MD on 05/06/2024 11:43 PM.     Pt states vision is doing well just some blurriness here and there, just saw Dr. Fleeta last week. Got updated rx.   Referring physician: Fleeta Zerita DASEN, MD [address] [place],  [place] [address]  HISTORICAL INFORMATION:  Selected notes from the MEDICAL RECORD NUMBER Referred by Dr. Fleeta for HST LEE:  Ocular Hx- PMH-   CURRENT MEDICATIONS: Current Outpatient Medications (Ophthalmic Drugs)  Medication Sig   Polyethyl Glycol-Propyl Glycol (SYSTANE OP) Place 1 drop into both eyes daily as needed (dry eyes).   No current facility-administered medications for this visit. (Ophthalmic Drugs)   Current Outpatient Medications (Other)  Medication Sig   acetaminophen  (TYLENOL ) 500 MG tablet Take 1,000 mg by mouth every 6 (six) hours as needed for moderate pain.   amoxicillin  (AMOXIL ) 500 MG capsule Take 4 capsules 1 hours prior to dental procedure.   anastrozole  (ARIMIDEX ) 1 MG tablet Take 1 tablet (1 mg total) by mouth daily.   aspirin  EC 81 MG tablet Take 81 mg by mouth daily. Swallow whole.   Biotin 5 MG TABS Take 5 mg by mouth daily.   buPROPion  (WELLBUTRIN  SR) 200 MG 12 hr tablet Take 1 tablet (200 mg total) by mouth 2 (two) times daily.   docusate sodium  (COLACE) 100 MG capsule Take 1 capsule (100 mg total) by mouth daily.   escitalopram   (LEXAPRO ) 20 MG tablet Take 1 tablet (20 mg total) by mouth daily.   ezetimibe  (ZETIA ) 10 MG tablet Take 1 tablet (10 mg total) by mouth daily. Must keep appt with Dr Wendel for further refills.   gabapentin  (NEURONTIN ) 300 MG capsule Take 1 capsule (300 mg total) by mouth 2 (two) times daily. (Patient taking differently: Take 300 mg by mouth as needed (Per pt).)   leuprolide  (LUPRON ) 30 MG injection Inject 30 mg into the muscle every 30 (thirty) days.   metFORMIN  (GLUCOPHAGE ) 500 MG tablet Take 2 tablets (1,000 mg total) by mouth daily with supper.   mirabegron  ER (MYRBETRIQ ) 50 MG TB24 tablet Take 1 tablet (50 mg total) by mouth daily.   omeprazole  (PRILOSEC) 20 MG capsule Take 1 capsule (20 mg total) by mouth daily.   polyethylene glycol (MIRALAX / GLYCOLAX) 17 g packet Take 17 g by mouth daily.   Probiotic Product (ALIGN PO) Take 1 tablet by mouth daily.    tretinoin  (RETIN-A ) 0.025 % cream Apply 1 application to the face Nightly.   triamcinolone  cream (KENALOG ) 0.1 % Apply 1 small Application topically 2 (two) times daily to affected area.   Vitamin D , Ergocalciferol , (DRISDOL ) 1.25 MG (50000 UNIT) CAPS capsule Take 1 capsule by mouth every 10 days.   No current facility-administered medications for this visit. (  Other)   Facility-Administered Medications Ordered in Other Visits (Other)  Medication Route   leuprolide  (LUPRON ) injection 3.75 mg Intramuscular   REVIEW OF SYSTEMS: ROS   Positive for: Endocrine, Eyes Negative for: Constitutional, Gastrointestinal, Neurological, Skin, Genitourinary, Musculoskeletal, HENT, Cardiovascular, Respiratory, Psychiatric, Allergic/Imm, Heme/Lymph Last edited by Resa Delon ORN, COT on 05/06/2024  7:59 AM.        ALLERGIES Allergies  Allergen Reactions   Codeine Nausea And Vomiting   Thimerosal (Thiomersal) Other (See Comments)    Eye redness    Ciprofloxacin Other (See Comments)    FATIGUE   Levofloxacin Other (See Comments)     fatigue   Oxaprozin Rash and Dermatitis   Stadol [Butorphanol Tartrate] Other (See Comments)    ALTERED MENTAL STATUS   Sulfa Antibiotics Rash and Dermatitis   PAST MEDICAL HISTORY Past Medical History:  Diagnosis Date   Anxiety    Arthritis    Cervical syndrome    CKD (chronic kidney disease), stage III (HCC)    Constipation    COVID-19 03/26/2019   Family history of colon cancer 02/15/2020   Fatty liver    GERD (gastroesophageal reflux disease)    Granulosa cell tumor of ovary    History of hiatal hernia    Hyperlipidemia    Joint pain    Neck pain    Obesity    Osteoarthritis    Ovarian cancer (HCC)    PONV (postoperative nausea and vomiting)    history of n/v,  past surgeries no n/v   Sleep apnea    uses CPAP   Tremor    Past Surgical History:  Procedure Laterality Date   ABDOMINAL HYSTERECTOMY     TAH/BSO   APPENDECTOMY     EXPLORATORY LAPAROTOMY     for bowel obstruction   IR IMAGING GUIDED PORT INSERTION  10/25/2020   IR REMOVAL TUN ACCESS W/ PORT W/O FL MOD SED  06/19/2021   left toe surgery Left    Secondary tumor debulking  2002   SHOULDER SURGERY     2017 left   Tumor debulking  2010   VENTRAL HERNIA REPAIR     FAMILY HISTORY Family History  Problem Relation Age of Onset   Cancer Mother        basal cell   Basal cell carcinoma Mother        79s   Thyroid  disease Mother    Cancer Father 63 - 48       colon   Stroke Father    Colon cancer Father 77   Hypertension Father    Heart disease Father    Colon cancer Paternal Uncle        dx 79s   Colon cancer Cousin        maternal cousin; dx 38s   BRCA 1/2 Neg Hx    Breast cancer Neg Hx    SOCIAL HISTORY Social History   Tobacco Use   Smoking status: Never   Smokeless tobacco: Never  Vaping Use   Vaping status: Never Used  Substance Use Topics   Alcohol use: Not Currently   Drug use: No       OPHTHALMIC EXAM:  Base Eye Exam     Visual Acuity (Snellen - Linear)       Right Left    Dist cc 20/30 20/25   Dist ph cc NI NI         Tonometry (Tonopen, 8:05 AM)       Right  Left   Pressure 18 18         Pupils       Pupils Dark Light Shape React APD   Right PERRL 4 3 Round Brisk None   Left PERRL 4 3 Round Brisk None         Visual Fields       Left Right    Full Full         Extraocular Movement       Right Left    Full, Ortho Full, Ortho         Neuro/Psych     Oriented x3: Yes   Mood/Affect: Normal         Dilation     Right eye:            Slit Lamp and Fundus Exam     Slit Lamp Exam       Right Left   Lids/Lashes Dermatochalasis - upper lid Dermatochalasis - upper lid   Conjunctiva/Sclera White and quiet White and quiet   Cornea trace PEE, trace tear film debris trace PEE   Anterior Chamber deep and clear deep and clear   Iris Round and dilated Round and reactive   Lens 2+ Nuclear sclerosis, 2+ Cortical cataract 2+ Nuclear sclerosis, 2+ Cortical cataract   Anterior Vitreous mild syneresis, no pigment, Posterior vitreous detachment, vitreous condensations mild syneresis, Posterior vitreous detachment         Fundus Exam       Right Left   Disc Pink and Sharp Pink and Sharp, mild tilt   C/D Ratio 0.4 0.3   Macula Flat, Blunted foveal reflex, ERM with striae, no heme Flat, Blunted foveal reflex   Vessels attenuated, mild tortuosity attenuated, mild tortuosity   Periphery HST at 1030 with shallow cuff of SRF anteriorly, light pigmented demarcation line posteriorly -- good laser changes surrounding; no new RT/RD Attached, No heme, focal pigmented CR atrophy at 0800 and 1030           Refraction     Wearing Rx       Sphere Cylinder Axis Add   Right -4.00 +0.50 170 +2.50   Left -4.00 +0.50 048 +2.50           IMAGING AND PROCEDURES  Imaging and Procedures for 05/06/2024  OCT, Retina - OU - Both Eyes       Right Eye Quality was good. Central Foveal Thickness: 400. Progression has been stable.  Findings include no IRF, abnormal foveal contour, epiretinal membrane, macular pucker, subretinal fluid (stable improvement in vitreous opacities, ERM with central thickening, blunted foveal contour and pucker, focal retinal break with +IRF/SRF superior periphery caught on widefield--not imaged today).   Left Eye Quality was good. Central Foveal Thickness: 296. Progression has been stable. Findings include normal foveal contour, no IRF, no SRF.   Notes *Images captured and stored on drive  Diagnosis / Impression:  OD: stable improvement in vitreous opacities, ERM with central thickening, blunted foveal contour and pucker, focal retinal break with +IRF/SRF superior periphery caught on widefield--not imaged today OS: NFP, no IRF/SRF  Clinical management:  See below  Abbreviations: NFP - Normal foveal profile. CME - cystoid macular edema. PED - pigment epithelial detachment. IRF - intraretinal fluid. SRF - subretinal fluid. EZ - ellipsoid zone. ERM - epiretinal membrane. ORA - outer retinal atrophy. ORT - outer retinal tubulation. SRHM - subretinal hyper-reflective material. IRHM - intraretinal hyper-reflective material      CBC with  Differential/Platelet           Component Ref Range & Units Value   WBC   4.0 - 10.5 K/uL 5.4   RBC   3.87 - 5.11 MIL/uL 4.66   Hemoglobin   12.0 - 15.0 g/dL 87.4   HCT   63.9 - 53.9 % 37.7   MCV   80.0 - 100.0 fL 80.9   MCH   26.0 - 34.0 pg 26.8   MCHC   30.0 - 36.0 g/dL 66.7   RDW   88.4 - 84.4 % 13.5   Platelets   150 - 400 K/uL 325   nRBC   0.0 - 0.2 % 0.0   Neutrophils Relative %   % 55   Neutro Abs   1.7 - 7.7 K/uL 3.0   Lymphocytes Relative   % 31   Lymphs Abs   0.7 - 4.0 K/uL 1.7   Monocytes Relative   % 9   Monocytes Absolute   0.1 - 1.0 K/uL 0.5   Eosinophils Relative   % 4   Eosinophils Absolute   0.0 - 0.5 K/uL 0.2   Basophils Relative   % 1   Basophils Absolute   0.0 - 0.1 K/uL 0.0   Immature Granulocytes   % 0    Abs Immature Granulocytes   0.00 - 0.07 K/uL 0.01   Comment: Performed at [facility], [address] [place], [place] [address]       Comprehensive metabolic panel             Component Ref Range & Units Value Flag   Sodium   135 - 145 mmol/L 138     Potassium   3.5 - 5.1 mmol/L 4.2     Chloride   98 - 111 mmol/L 103     CO2   22 - 32 mmol/L 23     Glucose, Bld   70 - 99 mg/dL 889  (H)     Comment: Glucose reference range applies only to samples taken after fasting for at least 8 hours.   BUN   8 - 23 mg/dL 15     Creatinine, Ser   0.44 - 1.00 mg/dL 8.85  (H)     Calcium    8.9 - 10.3 mg/dL 9.2     Total Protein   6.5 - 8.1 g/dL 7.1     Albumin   3.5 - 5.0 g/dL 4.4     AST   15 - 41 U/L 24     ALT   0 - 44 U/L 20     Alkaline Phosphatase   38 - 126 U/L 93     Total Bilirubin   0.0 - 1.2 mg/dL 0.3     GFR, Estimated   >60 mL/min 53  (L)     Comment: (NOTE) Calculated using the CKD-EPI Creatinine Equation (2021)    Anion gap   5 - 15 13     Comment: Performed at [facility], [address], [place], [place] [address]            ASSESSMENT/PLAN:   ICD-10-CM   1. Retinal tear of right eye  H33.311 OCT, Retina - OU - Both Eyes    2. Right retinal detachment  H33.21     3. Epiretinal membrane (ERM) of right eye  H35.371 OCT, Retina - OU - Both Eyes    4. Combined forms of age-related cataract of both  eyes  H25.813       1,2. Retinal tear with +SRF / focal retinal detachment, OD - bi-lobed retinal tear at 1000 with +cuff of SRF anteriorly, light pigmented demarcation line posteriorly - s/p laser retinopexy OD (11.13.24) -- good laser changes surrounding - no new RT/RD - f/u in 9 months, DFE, OCT  3. Epiretinal membrane, right eye  - Mild ERM with central thickening, loss of foveal contour and pucker  - BCVA improved to 20/30 from 20/40 - asymptomatic, no metamorphopsia - cont monitoring -  f/u 9 months DFE, OCT  4. Mixed Cataract OU - The symptoms of cataract, surgical options, and treatments and risks were discussed with patient. - discussed diagnosis and progression - monitor  Ophthalmic Meds Ordered this visit:  No orders of the defined types were placed in this encounter.    Return in about 9 months (around 02/03/2025) for RT/ERM OD, DFE, OCT.  There are no Patient Instructions on file for this visit.  Explained the diagnoses, plan, and follow up with the patient and they expressed understanding.  Patient expressed understanding of the importance of proper follow up care.   This document serves as a record of services personally performed by Redell JUDITHANN Hans, MD, PhD. It was created on their behalf by Avelina Pereyra, COA an ophthalmic technician. The creation of this record is the provider's dictation and/or activities during the visit.   Electronically signed by: Avelina GORMAN Pereyra, COT  05/06/24  11:53 PM   This document serves as a record of services personally performed by Redell JUDITHANN Hans, MD, PhD. It was created on their behalf by Almetta Pesa, an ophthalmic technician. The creation of this record is the provider's dictation and/or activities during the visit.    Electronically signed by: Almetta Pesa, OA, 05/06/24  11:53 PM  Redell JUDITHANN Hans, M.D., Ph.D. Diseases & Surgery of the Retina and Vitreous [facility]  I have reviewed the above documentation for accuracy and completeness, and I agree with the above. Redell JUDITHANN Hans, M.D., Ph.D. 05/06/24 11:53 PM   Abbreviations: M myopia (nearsighted); A astigmatism; H hyperopia (farsighted); P presbyopia; Mrx spectacle prescription;  CTL contact lenses; OD right eye; OS left eye; OU both eyes  XT exotropia; ET esotropia; PEK punctate epithelial keratitis; PEE punctate epithelial erosions; DES dry eye syndrome; MGD meibomian gland dysfunction; ATs artificial tears; PFAT's preservative free  artificial tears; NSC nuclear sclerotic cataract; PSC posterior subcapsular cataract; ERM epi-retinal membrane; PVD posterior vitreous detachment; RD retinal detachment; DM diabetes mellitus; DR diabetic retinopathy; NPDR non-proliferative diabetic retinopathy; PDR proliferative diabetic retinopathy; CSME clinically significant macular edema; DME diabetic macular edema; dbh dot blot hemorrhages; CWS cotton wool spot; POAG primary open angle glaucoma; C/D cup-to-disc ratio; HVF humphrey visual field; GVF goldmann visual field; OCT optical coherence tomography; IOP intraocular pressure; BRVO Branch retinal vein occlusion; CRVO central retinal vein occlusion; CRAO central retinal artery occlusion; BRAO branch retinal artery occlusion; RT retinal tear; SB scleral buckle; PPV pars plana vitrectomy; VH Vitreous hemorrhage; PRP panretinal laser photocoagulation; IVK intravitreal kenalog ; VMT vitreomacular traction; MH Macular hole;  NVD neovascularization of the disc; NVE neovascularization elsewhere; AREDS age related eye disease study; ARMD age related macular degeneration; POAG primary open angle glaucoma; EBMD epithelial/anterior basement membrane dystrophy; ACIOL anterior chamber intraocular lens; IOL intraocular lens; PCIOL posterior chamber intraocular lens; Phaco/IOL phacoemulsification with intraocular lens placement; PRK photorefractive keratectomy; LASIK laser assisted in situ keratomileusis; HTN hypertension; DM diabetes mellitus; COPD chronic obstructive  pulmonary disease  "

## 2024-04-26 NOTE — Telephone Encounter (Signed)
 PCCs:  Can you please follow up on this order for the patients CPAP supplies?  An electronic order was put in on 12/17.  Can you check to see if the LOV note was sent as well?  Thank you.

## 2024-05-02 ENCOUNTER — Encounter: Payer: Self-pay | Admitting: Internal Medicine

## 2024-05-04 NOTE — Progress Notes (Signed)
 "  Radiation Oncology         [phone] ________________________________  Name: ADRAINE BIFFLE MRN: [id number]  Date: 05/11/2024  DOB: 1955/12/19  Follow-Up Visit Note  CC: Dwight Trula SQUIBB, MD  Dwight Trula SQUIBB, MD    ICD-10-CM   1. 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 (s/p hysterectomy, BSO ,and multiple lines of systemic therapy since 2002) - now with an increasing left external iliac lymph node (current treatment consists of monthly lupron  w/ daily anastrozole  which she has been on since 2023); s/p palliative radiation completed on 04/08/2024   Previous Treatment and Interval Since Last Radiation:  1 month   First Treatment Date: 2024-03-25 Last Treatment Date: 2024-04-08   Plan Name: Pelvis_LN Site: Internal Iliac Nodes Technique: IMRT Mode: Photon Dose Per Fraction: 3 Gy Prescribed Dose (Delivered / Prescribed): 30 Gy / 30 Gy Prescribed Fxs (Delivered / Prescribed): 10 / 10  Narrative:  The patient returns today for routine follow-up. She completed her treatment approximately 1 month ago.       She developed abdominal pain throughout her treatment which she attributed to constipation from Imodium.          Today, she reports to be doing well overall. She states that her diarrhea has resolved. She is taking Miralax to help with on going constipation. She denies any abdominal pain, skin irritation, or changes to her energy levels. She is pleased with how she responded to the treatment.               ALLERGIES:  is allergic to codeine, thimerosal (thiomersal), ciprofloxacin, levofloxacin, oxaprozin, stadol [butorphanol tartrate], and sulfa antibiotics.  Meds: Current Outpatient Medications  Medication Sig Dispense Refill   acetaminophen  (TYLENOL ) 500 MG tablet Take 1,000 mg by mouth every 6 (six) hours as needed for  moderate pain.     amoxicillin  (AMOXIL ) 500 MG capsule Take 4 capsules 1 hours prior to dental procedure. 20 capsule 0   anastrozole  (ARIMIDEX ) 1 MG tablet Take 1 tablet (1 mg total) by mouth daily. 90 tablet 3   aspirin  EC 81 MG tablet Take 81 mg by mouth daily. Swallow whole.     Biotin 5 MG TABS Take 5 mg by mouth daily.     buPROPion  (WELLBUTRIN  SR) 200 MG 12 hr tablet Take 1 tablet (200 mg total) by mouth 2 (two) times daily. 60 tablet 1   docusate sodium  (COLACE) 100 MG capsule Take 1 capsule (100 mg total) by mouth daily. 30 capsule 0   escitalopram  (LEXAPRO ) 20 MG tablet Take 1 tablet (20 mg total) by mouth daily. 90 tablet 2   ezetimibe  (ZETIA ) 10 MG tablet Take 1 tablet (10 mg total) by mouth daily. Must keep appt with Dr Wendel for further refills. 60 tablet 0   gabapentin  (NEURONTIN ) 300 MG capsule Take 1 capsule (300 mg total) by mouth 2 (two) times daily. 60 capsule 3   leuprolide  (LUPRON ) 30 MG injection Inject 30 mg into the muscle every 30 (thirty) days.     metFORMIN  (GLUCOPHAGE ) 500 MG tablet Take 2 tablets (1,000 mg total) by mouth daily with supper. 180 tablet 3   mirabegron  ER (MYRBETRIQ ) 50 MG TB24 tablet Take 1 tablet (50 mg total) by mouth daily. 90 tablet 3   omeprazole  (PRILOSEC) 20 MG capsule Take 1 capsule (  20 mg total) by mouth daily. 90 capsule 2   Polyethyl Glycol-Propyl Glycol (SYSTANE OP) Place 1 drop into both eyes daily as needed (dry eyes).     polyethylene glycol (MIRALAX / GLYCOLAX) 17 g packet Take 17 g by mouth daily.     Probiotic Product (ALIGN PO) Take 1 tablet by mouth daily.      tretinoin  (RETIN-A ) 0.025 % cream Apply 1 application to the face Nightly. 20 g 2   triamcinolone  cream (KENALOG ) 0.1 % Apply 1 small Application topically 2 (two) times daily to affected area. 454 g 0   Vitamin D , Ergocalciferol , (DRISDOL ) 1.25 MG (50000 UNIT) CAPS capsule Take 1 capsule by mouth every 10 days. 9 capsule 1   No current facility-administered medications  for this encounter.   Facility-Administered Medications Ordered in Other Encounters  Medication Dose Route Frequency Provider Last Rate Last Admin   leuprolide  (LUPRON ) injection 3.75 mg  3.75 mg Intramuscular Once [name], Ni, MD        Physical Findings: The patient is in no acute distress. Patient is alert and oriented.  height is 5' 6 (1.676 m) and weight is 191 lb (86.6 kg). Her temperature is 97.4 F (36.3 C) (abnormal). Her blood pressure is 134/75 and her pulse is 60. Her respiration is 18 and oxygen saturation is 100%. .  No significant changes. Lungs are clear to auscultation bilaterally. Heart has regular rate and rhythm. No palpable cervical or supraclavicular adenopathy. Abdomen soft, non-tender, normal bowel sounds.  Lab Findings: Lab Results  Component Value Date   WBC 5.4 05/06/2024   HGB 12.5 05/06/2024   HCT 37.7 05/06/2024   MCV 80.9 05/06/2024   PLT 325 05/06/2024    Radiographic Findings: OCT, Retina - OU - Both Eyes Result Date: 05/06/2024 Right Eye Quality was good. Central Foveal Thickness: 400. Progression has been stable. Findings include no IRF, abnormal foveal contour, epiretinal membrane, macular pucker, subretinal fluid (stable improvement in vitreous opacities, ERM with central thickening, blunted foveal contour and pucker, focal retinal break with +IRF/SRF superior periphery caught on widefield--not imaged today). Left Eye Quality was good. Central Foveal Thickness: 296. Progression has been stable. Findings include normal foveal contour, no IRF, no SRF. Notes *Images captured and stored on drive Diagnosis / Impression: OD: stable improvement in vitreous opacities, ERM with central thickening, blunted foveal contour and pucker, focal retinal break with +IRF/SRF superior periphery caught on widefield--not imaged today OS: NFP, no IRF/SRF Clinical management: See below Abbreviations: NFP - Normal foveal profile. CME - cystoid macular edema. PED - pigment  epithelial detachment. IRF - intraretinal fluid. SRF - subretinal fluid. EZ - ellipsoid zone. ERM - epiretinal membrane. ORA - outer retinal atrophy. ORT - outer retinal tubulation. SRHM - subretinal hyper-reflective material. 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 Urieta tolerated the treatment well overall. She has healed from the effects of her radiation.   She will continue on anastrozole  and monthly Lupron  under the care of Dr. Lonn. She is scheduled to see her next on 06/10/2024.   Radiation follow-up PRN. We appreciate the opportunity to take part in this patient's  care. She was encouraged to call with any questions or concerns.    I personally spent 20 minutes in this encounter including chart review, reviewing radiological studies, meeting face-to-face with the patient, entering orders and completing documentation.  ____________________________________    Leeroy Due, PA-C     "

## 2024-05-06 ENCOUNTER — Inpatient Hospital Stay

## 2024-05-06 ENCOUNTER — Encounter (INDEPENDENT_AMBULATORY_CARE_PROVIDER_SITE_OTHER): Payer: Self-pay | Admitting: Ophthalmology

## 2024-05-06 ENCOUNTER — Inpatient Hospital Stay: Attending: Hematology

## 2024-05-06 ENCOUNTER — Ambulatory Visit (INDEPENDENT_AMBULATORY_CARE_PROVIDER_SITE_OTHER): Admitting: Ophthalmology

## 2024-05-06 DIAGNOSIS — H3321 Serous retinal detachment, right eye: Secondary | ICD-10-CM | POA: Diagnosis not present

## 2024-05-06 DIAGNOSIS — C569 Malignant neoplasm of unspecified ovary: Secondary | ICD-10-CM | POA: Insufficient documentation

## 2024-05-06 DIAGNOSIS — H35371 Puckering of macula, right eye: Secondary | ICD-10-CM | POA: Diagnosis not present

## 2024-05-06 DIAGNOSIS — H25813 Combined forms of age-related cataract, bilateral: Secondary | ICD-10-CM | POA: Diagnosis not present

## 2024-05-06 DIAGNOSIS — H33311 Horseshoe tear of retina without detachment, right eye: Secondary | ICD-10-CM | POA: Diagnosis not present

## 2024-05-06 LAB — CBC WITH DIFFERENTIAL/PLATELET
Abs Immature Granulocytes: 0.01 K/uL (ref 0.00–0.07)
Basophils Absolute: 0 K/uL (ref 0.0–0.1)
Basophils Relative: 1 %
Eosinophils Absolute: 0.2 K/uL (ref 0.0–0.5)
Eosinophils Relative: 4 %
HCT: 37.7 % (ref 36.0–46.0)
Hemoglobin: 12.5 g/dL (ref 12.0–15.0)
Immature Granulocytes: 0 %
Lymphocytes Relative: 31 %
Lymphs Abs: 1.7 K/uL (ref 0.7–4.0)
MCH: 26.8 pg (ref 26.0–34.0)
MCHC: 33.2 g/dL (ref 30.0–36.0)
MCV: 80.9 fL (ref 80.0–100.0)
Monocytes Absolute: 0.5 K/uL (ref 0.1–1.0)
Monocytes Relative: 9 %
Neutro Abs: 3 K/uL (ref 1.7–7.7)
Neutrophils Relative %: 55 %
Platelets: 325 K/uL (ref 150–400)
RBC: 4.66 MIL/uL (ref 3.87–5.11)
RDW: 13.5 % (ref 11.5–15.5)
WBC: 5.4 K/uL (ref 4.0–10.5)
nRBC: 0 % (ref 0.0–0.2)

## 2024-05-06 LAB — COMPREHENSIVE METABOLIC PANEL WITH GFR
ALT: 20 U/L (ref 0–44)
AST: 24 U/L (ref 15–41)
Albumin: 4.4 g/dL (ref 3.5–5.0)
Alkaline Phosphatase: 93 U/L (ref 38–126)
Anion gap: 13 (ref 5–15)
BUN: 15 mg/dL (ref 8–23)
CO2: 23 mmol/L (ref 22–32)
Calcium: 9.2 mg/dL (ref 8.9–10.3)
Chloride: 103 mmol/L (ref 98–111)
Creatinine, Ser: 1.14 mg/dL — ABNORMAL HIGH (ref 0.44–1.00)
GFR, Estimated: 53 mL/min — ABNORMAL LOW
Glucose, Bld: 110 mg/dL — ABNORMAL HIGH (ref 70–99)
Potassium: 4.2 mmol/L (ref 3.5–5.1)
Sodium: 138 mmol/L (ref 135–145)
Total Bilirubin: 0.3 mg/dL (ref 0.0–1.2)
Total Protein: 7.1 g/dL (ref 6.5–8.1)

## 2024-05-06 MED ORDER — LEUPROLIDE ACETATE 3.75 MG IM KIT
3.7500 mg | PACK | Freq: Once | INTRAMUSCULAR | Status: AC
  Administered 2024-05-06: 3.75 mg via INTRAMUSCULAR
  Filled 2024-05-06: qty 3.75

## 2024-05-10 NOTE — Progress Notes (Addendum)
 "  Cardiology Office Note:   Date:  05/21/2024  ID:  [name], DOB May 11, 1955, MRN [id number] PCP:  Dwight Trula SQUIBB, MD  [facility] Providers Cardiologist:  Wendel Haws, MD Referring MD: Dwight Trula SQUIBB, MD  Chief Complaint/Reason for Referral: Follow-up hyperlipidemia ASSESSMENT:    1. Precordial pain   2. Diastolic dysfunction   3. Prediabetes   4. Hyperlipidemia LDL goal <70   5. Aortic atherosclerosis   6. CKD stage 3a, GFR 45-59 ml/min (HCC)   7. BMI 30.0-30.9,adult     PLAN:   In order of problems listed above: Chest pain syndrome: Reassuring coronary CTA in 2023.  No further chest pain issues. Diastolic dysfunction: Start Jardiance  10 mg daily due to diastolic dysfunction, CKD stage IIIa, and prediabetes.  She has a history of urinary tract infections 2 years ago.  We talked about the issues regarding SGLT2 inhibitors.  She is willing to try this medication and if she has an issue she will let us  now. Prediabetes:  Continue metformin .  Check hemoglobin A1c today Hyperlipidemia: Start Crestor  20mg , 40 mg, continue Zetia  10 mg, check lipid panel and LFTs in 2 months. Aortic atherosclerosis: Continue aspirin  81 mg, start Crestor  20mg  CKD stage IIIa:  Start Jardiance  10mg  today for renal protection. Elevated BMI: Check Hba1C today.  If indicative of DM, will let patient know to follow with PCP.            Dispo:  Return in about 6 months (around 11/17/2024).       I spent 33 minutes reviewing all clinical data during and prior to this visit including all relevant imaging studies, laboratories, clinical information from other health systems and prior notes from both Cardiology and other specialties, interviewing the patient, conducting a complete physical examination, and coordinating care in order to formulate a comprehensive and personalized evaluation and treatment plan.   History of Present Illness:    FOCUSED PROBLEM LIST:   Chest pain syndrome Normal  coronaries, CAC 0 coronary CTA 2023 Diastolic dysfunction G1 DD, no significant valve issues, EF 60 to 65% TTE 2023 Hyperlipidemia LP(a) 8.8 Intolerant of atorvastatin  Aortic atherosclerosis CT 2023 CKD stage IIIa OSA On CPAP BMI 30 Metastatic ovarian cancer On Taxol  Undergoing XRT therapy  May 2023:  Patient was seen for initial consultation regarding chest pain.  She was judged to have stable anginal symptoms.  Review of CT imaging done for oncologic purposes demonstrated no coronary artery calcification but did show aortic atherosclerosis.  She was started on Imdur  30 mg, as needed nitroglycerin , and a coronary CTA was performed.  This showed no obstructive coronary artery disease.   September 2023:  In the interim the patient has been seen by oncology.  She had a CT scan that I reviewed which demonstrated mild increase in the size of one of her lymph nodes.  Lipid panel was drawn which showed an LDL of 69 last month and LFTs were unremarkable.  She is doing very well.  She swam without any chest pain.  Curiously sometimes when she walks she gets chest tightness.  She did not end up starting Imdur  last time when it was prescribed.  She fortunately has not required any emergency room visits or hospitalizations.  She is otherwise well without significant cardiovascular complaints today.  Plan: Trial Imdur  and if helpful consider GI evaluation for esophageal spasm  January 2026:  Patient consents to use of AI scribe. The patient was last seen in 2024.  At  that point in time she was doing well.  She occasionally gets chest tightness walks up a hill but never when she was swimming.  She was previously prescribed Imdur  for esophageal spasms but has not been taking it and has not experienced recent discomfort. The discomfort was more noticeable when walking but has not been present lately.  She is currently on metformin  and Wellbutrin . She was seen at a weight loss center where she was put on  a medication she refers to as 'high fantasy insulin .' She is unsure about her diabetic status, stating she has been told she is prediabetic. She has not had her hemoglobin A1c tested recently and wants to discontinue metformin  due to bowel-related side effects, having reduced her intake to two tablets a day.  She has a history of aortic atherosclerosis and is currently taking Zetia  for cholesterol management. She previously tried atorvastatin  but experienced severe joint pain, which led to discontinuation. She has not tried rosuvastatin  before.  She underwent right hip replacement surgery recently, which resulted in a slight drop in hemoglobin levels. She attributes this to the surgery, noting some blood loss during the procedure. She also has a history of a rare ovarian cancer and is on Lupron  and Anastrozole  for treatment.  She reports a history of urinary tract infections, with the last occurrence about a year ago, and mentions having multiple infections two years prior.         Current Medications: Active Medications[1]   Review of Systems:   Please see the history of present illness.    All other systems reviewed and are negative.     EKGs/Labs/Other Test Reviewed:   EKG: 2024 sinus bradycardia  EKG Interpretation Date/Time:  Thursday May 20 2024 08:24:43 EST Ventricular Rate:  54 PR Interval:  138 QRS Duration:  84 QT Interval:  450 QTC Calculation: 426 R Axis:   -27  Text Interpretation: Sinus bradycardia When compared with ECG of 07-Jan-2023 07:42, QRS axis Shifted left Confirmed by Wendel Haws ([id number]) on 05/20/2024 8:29:27 AM        CARDIAC STUDIES: Refer to CV Procedures and Imaging Tabs   Risk Assessment/Calculations:          Physical Exam:   VS:  BP 112/70   Pulse (!) 55   Ht 5' 6 (1.676 m)   Wt 194 lb (88 kg)   SpO2 97%   BMI 31.31 kg/m        Wt Readings from Last 3 Encounters:  05/20/24 194 lb (88 kg)  05/11/24 191 lb (86.6 kg)  04/07/24  193 lb 3.2 oz (87.6 kg)      GENERAL:  No apparent distress, AOx3 HEENT:  No carotid bruits, +2 carotid impulses, no scleral icterus CAR: RRR no murmurs, gallops, rubs, or thrills RES:  Clear to auscultation bilaterally ABD:  Soft, nontender, nondistended, positive bowel sounds x 4 VASC:  +2 radial pulses, +2 carotid pulses NEURO:  CN 2-12 grossly intact; motor and sensory grossly intact PSYCH:  No active depression or anxiety EXT:  No edema, ecchymosis, or cyanosis  Signed, Christophere Hillhouse K Jalayla Chrismer, MD  05/21/2024 3:09 PM    [facility] [address], [place], [place]  [address] Phone: [phone]; Fax: [phone]   Note:  This document was prepared using Dragon voice recognition software and may include unintentional dictation errors.     [1]  Current Meds  Medication Sig   acetaminophen  (TYLENOL ) 500 MG tablet Take 1,000 mg by  mouth every 6 (six) hours as needed for moderate pain.   anastrozole  (ARIMIDEX ) 1 MG tablet Take 1 tablet (1 mg total) by mouth daily.   aspirin  EC 81 MG tablet Take 81 mg by mouth daily. Swallow whole.   Biotin 5 MG TABS Take 5 mg by mouth daily.   buPROPion  (WELLBUTRIN  SR) 200 MG 12 hr tablet Take 1 tablet (200 mg total) by mouth 2 (two) times daily.   empagliflozin  (JARDIANCE ) 10 MG TABS tablet Take 1 tablet (10 mg total) by mouth daily before breakfast. (Patient not taking: Reported on 05/21/2024)   escitalopram  (LEXAPRO ) 20 MG tablet Take 1 tablet (20 mg total) by mouth daily.   ezetimibe  (ZETIA ) 10 MG tablet Take 1 tablet (10 mg total) by mouth daily. Must keep appt with Dr Wendel for further refills.   leuprolide  (LUPRON ) 30 MG injection Inject 30 mg into the muscle every 30 (thirty) days.   metFORMIN  (GLUCOPHAGE ) 500 MG tablet Take 2 tablets (1,000 mg total) by mouth daily with supper.   mirabegron  ER (MYRBETRIQ ) 50 MG TB24 tablet Take 1 tablet (50 mg total) by mouth daily.   omeprazole  (PRILOSEC) 20 MG capsule Take 1 capsule  (20 mg total) by mouth daily.   Polyethyl Glycol-Propyl Glycol (SYSTANE OP) Place 1 drop into both eyes daily as needed (dry eyes).   polyethylene glycol (MIRALAX / GLYCOLAX) 17 g packet Take 17 g by mouth daily.   Probiotic Product (ALIGN PO) Take 1 tablet by mouth daily.    psyllium (METAMUCIL) 58.6 % powder Take 1 packet by mouth 3 (three) times daily.   rosuvastatin  (CRESTOR ) 20 MG tablet Take 1 tablet (20 mg total) by mouth daily.   tretinoin  (RETIN-A ) 0.025 % cream Apply 1 application to the face Nightly.   triamcinolone  cream (KENALOG ) 0.1 % Apply 1 small Application topically 2 (two) times daily to affected area.   Vitamin D , Ergocalciferol , (DRISDOL ) 1.25 MG (50000 UNIT) CAPS capsule Take 1 capsule by mouth every 10 days.   "

## 2024-05-10 NOTE — Progress Notes (Signed)
 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 She completed radiation treatment on 04-08-2024.   They completed their radi  Does the patient complain of any of the following:  Pain:*** Abdominal bloating: *** Diarrhea/Constipation: *** Nausea/Vomiting: *** Vaginal Discharge: *** Blood in Urine or Stool: *** Urinary Issues (dysuria/incomplete emptying/ incontinence/ increased frequency/urgency): *** Does patient report using vaginal dilator 2-3 times a week and/or sexually active 2-3 weeks: *** Post radiation skin changes: ***   Additional comments if applicable: She will continue follow up with Dr. Lonn.

## 2024-05-11 ENCOUNTER — Ambulatory Visit
Admission: RE | Admit: 2024-05-11 | Discharge: 2024-05-11 | Disposition: A | Discharge status: Home / Self Care | Source: Ambulatory Visit | Attending: Radiology | Admitting: Radiology

## 2024-05-11 ENCOUNTER — Encounter: Payer: Self-pay | Admitting: Radiology

## 2024-05-11 VITALS — BP 134/75 | HR 60 | Temp 97.4°F | Resp 18 | Ht 66.0 in | Wt 191.0 lb

## 2024-05-11 DIAGNOSIS — C569 Malignant neoplasm of unspecified ovary: Secondary | ICD-10-CM

## 2024-05-11 HISTORY — DX: Personal history of irradiation: Z92.3

## 2024-05-16 ENCOUNTER — Other Ambulatory Visit (HOSPITAL_COMMUNITY): Payer: Self-pay

## 2024-05-17 ENCOUNTER — Other Ambulatory Visit: Payer: Self-pay

## 2024-05-17 ENCOUNTER — Other Ambulatory Visit (HOSPITAL_COMMUNITY): Payer: Self-pay

## 2024-05-18 ENCOUNTER — Other Ambulatory Visit (HOSPITAL_COMMUNITY): Payer: Self-pay

## 2024-05-18 MED ORDER — BUPROPION HCL ER (SR) 200 MG PO TB12
200.0000 mg | ORAL_TABLET | Freq: Two times a day (BID) | ORAL | 1 refills | Status: AC
  Filled 2024-05-18: qty 60, 30d supply, fill #0

## 2024-05-19 ENCOUNTER — Other Ambulatory Visit (HOSPITAL_COMMUNITY): Payer: Self-pay

## 2024-05-20 ENCOUNTER — Encounter: Payer: Self-pay | Admitting: Internal Medicine

## 2024-05-20 ENCOUNTER — Encounter: Payer: Self-pay | Admitting: Hematology and Oncology

## 2024-05-20 ENCOUNTER — Ambulatory Visit: Attending: Internal Medicine | Admitting: Internal Medicine

## 2024-05-20 ENCOUNTER — Other Ambulatory Visit: Payer: Self-pay

## 2024-05-20 ENCOUNTER — Other Ambulatory Visit (HOSPITAL_COMMUNITY): Payer: Self-pay

## 2024-05-20 VITALS — BP 112/70 | HR 55 | Ht 66.0 in | Wt 194.0 lb

## 2024-05-20 DIAGNOSIS — E119 Type 2 diabetes mellitus without complications: Secondary | ICD-10-CM

## 2024-05-20 DIAGNOSIS — R7303 Prediabetes: Secondary | ICD-10-CM | POA: Diagnosis not present

## 2024-05-20 DIAGNOSIS — N1831 Chronic kidney disease, stage 3a: Secondary | ICD-10-CM

## 2024-05-20 DIAGNOSIS — N183 Chronic kidney disease, stage 3 unspecified: Secondary | ICD-10-CM | POA: Diagnosis not present

## 2024-05-20 DIAGNOSIS — R072 Precordial pain: Secondary | ICD-10-CM

## 2024-05-20 DIAGNOSIS — E785 Hyperlipidemia, unspecified: Secondary | ICD-10-CM | POA: Diagnosis not present

## 2024-05-20 DIAGNOSIS — E1169 Type 2 diabetes mellitus with other specified complication: Secondary | ICD-10-CM

## 2024-05-20 DIAGNOSIS — Z6830 Body mass index (BMI) 30.0-30.9, adult: Secondary | ICD-10-CM

## 2024-05-20 DIAGNOSIS — I700 Atherosclerosis of aorta: Secondary | ICD-10-CM

## 2024-05-20 DIAGNOSIS — I5189 Other ill-defined heart diseases: Secondary | ICD-10-CM | POA: Diagnosis not present

## 2024-05-20 DIAGNOSIS — E1122 Type 2 diabetes mellitus with diabetic chronic kidney disease: Secondary | ICD-10-CM | POA: Diagnosis not present

## 2024-05-20 LAB — HEMOGLOBIN A1C
Est. average glucose Bld gHb Est-mCnc: 114 mg/dL
Hgb A1c MFr Bld: 5.6 % (ref 4.8–5.6)

## 2024-05-20 MED ORDER — ROSUVASTATIN CALCIUM 20 MG PO TABS
20.0000 mg | ORAL_TABLET | Freq: Every day | ORAL | 3 refills | Status: AC
  Filled 2024-05-20: qty 90, 90d supply, fill #0

## 2024-05-20 MED ORDER — EMPAGLIFLOZIN 10 MG PO TABS
10.0000 mg | ORAL_TABLET | Freq: Every day | ORAL | 3 refills | Status: AC
  Filled 2024-05-20: qty 90, 90d supply, fill #0

## 2024-05-20 NOTE — Telephone Encounter (Signed)
 Call [facility], pt hadn't used machine since Oct 2024. They called to speak to her on 1/41/2026 and left a message; insurance won't cover this order unless she wears the machine for 30 days, straight, then calls DME to let them know, then insurance will cover this. Pt stated she needed the strap for the face mask and didn't have it until recently, so she has been wearing it. I informed her about this information so this order can be fulfilled. Also provided my direct line for any questions or concerns.

## 2024-05-20 NOTE — Telephone Encounter (Signed)
 [name], Any update on this?

## 2024-05-20 NOTE — Patient Instructions (Addendum)
 Medication Instructions:   Start Rosuvastatin   *If you need a refill on your cardiac medications before your next appointment, please call your pharmacy*   Lab Work: HgbAic today  In 2 months Lipid  Liver panel  If you have labs (blood work) drawn today and your tests are completely normal, you will receive your results only by: MyChart Message (if you have MyChart) OR A paper copy in the mail If you have any lab test that is abnormal or we need to change your treatment, we will call you to review the results.   Testing/Procedures:  Not needed  Follow-Up: At [facility], you and your health needs are our priority.  As part of our continuing mission to provide you with exceptional heart care, we have created designated Provider Care Teams.  These Care Teams include your primary Cardiologist (physician) and Advanced Practice Providers (APPs -  Physician Assistants and Nurse Practitioners) who all work together to provide you with the care you need, when you need it.     Your next appointment:   6 month(s)  The format for your next appointment:   In Person  Provider:   Arun K Thukkani, MD   Other Instructions

## 2024-05-21 ENCOUNTER — Encounter: Payer: Self-pay | Admitting: Internal Medicine

## 2024-05-21 ENCOUNTER — Ambulatory Visit: Payer: Self-pay | Admitting: Internal Medicine

## 2024-05-24 NOTE — Telephone Encounter (Signed)
 fyi

## 2024-06-03 ENCOUNTER — Inpatient Hospital Stay

## 2024-06-03 ENCOUNTER — Inpatient Hospital Stay: Admitting: Hematology and Oncology

## 2024-06-10 ENCOUNTER — Inpatient Hospital Stay: Admitting: Hematology and Oncology

## 2024-06-10 ENCOUNTER — Inpatient Hospital Stay

## 2024-07-28 ENCOUNTER — Ambulatory Visit: Admitting: Pulmonary Disease

## 2025-02-14 ENCOUNTER — Encounter (INDEPENDENT_AMBULATORY_CARE_PROVIDER_SITE_OTHER): Admitting: Ophthalmology
